# Patient Record
Sex: Female | Born: 1950
Health system: Southern US, Community
[De-identification: ages and names within clinical notes are randomized; demographics above are authoritative.]

## PROBLEM LIST (undated history)

## (undated) ENCOUNTER — Emergency Department (HOSPITAL_BASED_OUTPATIENT_CLINIC_OR_DEPARTMENT_OTHER): Admission: EM | Payer: Medicare Other

## (undated) DIAGNOSIS — J45909 Unspecified asthma, uncomplicated: Secondary | ICD-10-CM

## (undated) DIAGNOSIS — I2510 Atherosclerotic heart disease of native coronary artery without angina pectoris: Secondary | ICD-10-CM

## (undated) DIAGNOSIS — M5430 Sciatica, unspecified side: Secondary | ICD-10-CM

## (undated) DIAGNOSIS — Z8489 Family history of other specified conditions: Secondary | ICD-10-CM

## (undated) DIAGNOSIS — J449 Chronic obstructive pulmonary disease, unspecified: Secondary | ICD-10-CM

## (undated) DIAGNOSIS — M1990 Unspecified osteoarthritis, unspecified site: Secondary | ICD-10-CM

## (undated) DIAGNOSIS — G4730 Sleep apnea, unspecified: Secondary | ICD-10-CM

## (undated) DIAGNOSIS — J189 Pneumonia, unspecified organism: Secondary | ICD-10-CM

## (undated) DIAGNOSIS — J40 Bronchitis, not specified as acute or chronic: Secondary | ICD-10-CM

## (undated) DIAGNOSIS — C7910 Secondary malignant neoplasm of unspecified urinary organs: Secondary | ICD-10-CM

## (undated) DIAGNOSIS — M797 Fibromyalgia: Secondary | ICD-10-CM

## (undated) DIAGNOSIS — I219 Acute myocardial infarction, unspecified: Secondary | ICD-10-CM

## (undated) DIAGNOSIS — N189 Chronic kidney disease, unspecified: Secondary | ICD-10-CM

## (undated) DIAGNOSIS — I739 Peripheral vascular disease, unspecified: Secondary | ICD-10-CM

## (undated) DIAGNOSIS — E039 Hypothyroidism, unspecified: Secondary | ICD-10-CM

## (undated) DIAGNOSIS — F32A Depression, unspecified: Secondary | ICD-10-CM

## (undated) DIAGNOSIS — M5126 Other intervertebral disc displacement, lumbar region: Secondary | ICD-10-CM

## (undated) DIAGNOSIS — D649 Anemia, unspecified: Secondary | ICD-10-CM

## (undated) DIAGNOSIS — F419 Anxiety disorder, unspecified: Secondary | ICD-10-CM

## (undated) DIAGNOSIS — I10 Essential (primary) hypertension: Secondary | ICD-10-CM

## (undated) DIAGNOSIS — F329 Major depressive disorder, single episode, unspecified: Secondary | ICD-10-CM

## (undated) DIAGNOSIS — K56609 Unspecified intestinal obstruction, unspecified as to partial versus complete obstruction: Secondary | ICD-10-CM

## (undated) HISTORY — PX: ABDOMINAL SURGERY: SHX537

## (undated) HISTORY — PX: ABDOMINAL HYSTERECTOMY: SHX81

## (undated) HISTORY — PX: OTHER SURGICAL HISTORY: SHX169

## (undated) HISTORY — PX: TONSILLECTOMY: SUR1361

## (undated) HISTORY — PX: BACK SURGERY: SHX140

## (undated) HISTORY — PX: EYE SURGERY: SHX253

## (undated) HISTORY — PX: DILATION AND CURETTAGE OF UTERUS: SHX78

## (undated) HISTORY — PX: COLONOSCOPY: SHX174

## (undated) HISTORY — PX: SALIVARY GLAND SURGERY: SHX768

## (undated) HISTORY — PX: COLON SURGERY: SHX602

## (undated) HISTORY — PX: ANTERIOR CRUCIATE LIGAMENT REPAIR: SHX115

## (undated) MED FILL — Fosaprepitant Dimeglumine For IV Infusion 150 MG (Base Eq): INTRAVENOUS | Qty: 5 | Status: AC

---

## 1969-02-23 HISTORY — PX: HERNIA REPAIR: SHX51

## 2000-12-28 ENCOUNTER — Emergency Department (HOSPITAL_COMMUNITY): Admission: EM | Admit: 2000-12-28 | Discharge: 2000-12-28 | Payer: Self-pay | Admitting: Emergency Medicine

## 2001-02-25 ENCOUNTER — Encounter: Admission: RE | Admit: 2001-02-25 | Discharge: 2001-04-24 | Payer: Self-pay | Admitting: Family Medicine

## 2001-04-28 ENCOUNTER — Encounter: Admission: RE | Admit: 2001-04-28 | Discharge: 2001-04-28 | Payer: Self-pay | Admitting: Neurosurgery

## 2001-04-28 ENCOUNTER — Encounter: Payer: Self-pay | Admitting: Neurosurgery

## 2001-05-12 ENCOUNTER — Encounter: Admission: RE | Admit: 2001-05-12 | Discharge: 2001-05-12 | Payer: Self-pay | Admitting: Neurosurgery

## 2001-05-12 ENCOUNTER — Encounter: Payer: Self-pay | Admitting: Neurosurgery

## 2001-05-25 ENCOUNTER — Inpatient Hospital Stay (HOSPITAL_COMMUNITY): Admission: EM | Admit: 2001-05-25 | Discharge: 2001-05-27 | Payer: Self-pay | Admitting: Emergency Medicine

## 2001-05-25 ENCOUNTER — Encounter: Payer: Self-pay | Admitting: Emergency Medicine

## 2001-05-26 ENCOUNTER — Encounter: Payer: Self-pay | Admitting: Cardiology

## 2001-06-04 ENCOUNTER — Emergency Department (HOSPITAL_COMMUNITY): Admission: EM | Admit: 2001-06-04 | Discharge: 2001-06-04 | Payer: Self-pay | Admitting: Emergency Medicine

## 2001-06-04 ENCOUNTER — Encounter: Payer: Self-pay | Admitting: Emergency Medicine

## 2001-06-04 ENCOUNTER — Encounter: Admission: RE | Admit: 2001-06-04 | Discharge: 2001-06-04 | Payer: Self-pay | Admitting: Internal Medicine

## 2001-06-06 ENCOUNTER — Encounter: Admission: RE | Admit: 2001-06-06 | Discharge: 2001-06-06 | Payer: Self-pay | Admitting: Neurosurgery

## 2001-06-06 ENCOUNTER — Encounter: Payer: Self-pay | Admitting: Neurosurgery

## 2001-07-04 ENCOUNTER — Encounter: Admission: RE | Admit: 2001-07-04 | Discharge: 2001-07-04 | Payer: Self-pay | Admitting: *Deleted

## 2001-07-04 ENCOUNTER — Encounter: Payer: Self-pay | Admitting: Family Medicine

## 2001-07-04 ENCOUNTER — Ambulatory Visit (HOSPITAL_COMMUNITY): Admission: RE | Admit: 2001-07-04 | Discharge: 2001-07-04 | Payer: Self-pay | Admitting: Family Medicine

## 2001-07-30 ENCOUNTER — Ambulatory Visit (HOSPITAL_COMMUNITY): Admission: RE | Admit: 2001-07-30 | Discharge: 2001-07-31 | Payer: Self-pay | Admitting: Neurosurgery

## 2001-07-30 ENCOUNTER — Encounter: Payer: Self-pay | Admitting: Neurosurgery

## 2001-10-24 ENCOUNTER — Emergency Department (HOSPITAL_COMMUNITY): Admission: EM | Admit: 2001-10-24 | Discharge: 2001-10-24 | Payer: Self-pay | Admitting: Emergency Medicine

## 2001-12-07 ENCOUNTER — Inpatient Hospital Stay (HOSPITAL_COMMUNITY): Admission: EM | Admit: 2001-12-07 | Discharge: 2001-12-11 | Payer: Self-pay | Admitting: Emergency Medicine

## 2001-12-07 ENCOUNTER — Encounter: Payer: Self-pay | Admitting: Emergency Medicine

## 2002-01-03 ENCOUNTER — Emergency Department (HOSPITAL_COMMUNITY): Admission: EM | Admit: 2002-01-03 | Discharge: 2002-01-03 | Payer: Self-pay | Admitting: Emergency Medicine

## 2002-02-26 ENCOUNTER — Encounter: Admission: RE | Admit: 2002-02-26 | Discharge: 2002-02-26 | Payer: Self-pay | Admitting: Neurosurgery

## 2002-02-26 ENCOUNTER — Encounter: Payer: Self-pay | Admitting: Neurosurgery

## 2002-03-12 ENCOUNTER — Encounter: Admission: RE | Admit: 2002-03-12 | Discharge: 2002-03-12 | Payer: Self-pay | Admitting: Neurosurgery

## 2002-03-12 ENCOUNTER — Encounter: Payer: Self-pay | Admitting: Neurosurgery

## 2002-03-26 ENCOUNTER — Encounter: Payer: Self-pay | Admitting: Neurosurgery

## 2002-03-26 ENCOUNTER — Encounter: Admission: RE | Admit: 2002-03-26 | Discharge: 2002-03-26 | Payer: Self-pay | Admitting: Neurosurgery

## 2002-05-12 ENCOUNTER — Emergency Department (HOSPITAL_COMMUNITY): Admission: EM | Admit: 2002-05-12 | Discharge: 2002-05-12 | Payer: Self-pay | Admitting: Emergency Medicine

## 2002-06-16 ENCOUNTER — Encounter: Payer: Self-pay | Admitting: Neurosurgery

## 2002-06-16 ENCOUNTER — Encounter: Admission: RE | Admit: 2002-06-16 | Discharge: 2002-06-16 | Payer: Self-pay | Admitting: Neurosurgery

## 2002-06-17 ENCOUNTER — Emergency Department (HOSPITAL_COMMUNITY): Admission: EM | Admit: 2002-06-17 | Discharge: 2002-06-17 | Payer: Self-pay | Admitting: Emergency Medicine

## 2002-07-23 ENCOUNTER — Emergency Department (HOSPITAL_COMMUNITY): Admission: EM | Admit: 2002-07-23 | Discharge: 2002-07-23 | Payer: Self-pay | Admitting: Emergency Medicine

## 2002-07-23 ENCOUNTER — Encounter: Payer: Self-pay | Admitting: Neurosurgery

## 2002-08-04 ENCOUNTER — Encounter
Admission: RE | Admit: 2002-08-04 | Discharge: 2002-08-04 | Payer: Self-pay | Admitting: Physical Medicine & Rehabilitation

## 2002-08-24 ENCOUNTER — Ambulatory Visit (HOSPITAL_COMMUNITY): Admission: RE | Admit: 2002-08-24 | Discharge: 2002-08-24 | Payer: Self-pay | Admitting: Family Medicine

## 2002-08-24 ENCOUNTER — Encounter: Payer: Self-pay | Admitting: Family Medicine

## 2002-10-10 ENCOUNTER — Emergency Department (HOSPITAL_COMMUNITY): Admission: EM | Admit: 2002-10-10 | Discharge: 2002-10-10 | Payer: Self-pay | Admitting: Emergency Medicine

## 2002-10-10 ENCOUNTER — Encounter: Payer: Self-pay | Admitting: Emergency Medicine

## 2002-10-11 ENCOUNTER — Emergency Department (HOSPITAL_COMMUNITY): Admission: EM | Admit: 2002-10-11 | Discharge: 2002-10-11 | Payer: Self-pay | Admitting: Emergency Medicine

## 2002-10-12 ENCOUNTER — Emergency Department (HOSPITAL_COMMUNITY): Admission: EM | Admit: 2002-10-12 | Discharge: 2002-10-12 | Payer: Self-pay | Admitting: Emergency Medicine

## 2002-10-12 ENCOUNTER — Encounter: Payer: Self-pay | Admitting: Emergency Medicine

## 2002-11-10 ENCOUNTER — Emergency Department (HOSPITAL_COMMUNITY): Admission: EM | Admit: 2002-11-10 | Discharge: 2002-11-10 | Payer: Self-pay | Admitting: Emergency Medicine

## 2002-11-26 ENCOUNTER — Encounter
Admission: RE | Admit: 2002-11-26 | Discharge: 2003-02-24 | Payer: Self-pay | Admitting: Physical Medicine & Rehabilitation

## 2003-02-23 ENCOUNTER — Encounter
Admission: RE | Admit: 2003-02-23 | Discharge: 2003-05-24 | Payer: Self-pay | Admitting: Physical Medicine & Rehabilitation

## 2003-05-15 ENCOUNTER — Emergency Department (HOSPITAL_COMMUNITY): Admission: EM | Admit: 2003-05-15 | Discharge: 2003-05-15 | Payer: Self-pay | Admitting: Emergency Medicine

## 2003-05-24 ENCOUNTER — Encounter
Admission: RE | Admit: 2003-05-24 | Discharge: 2003-08-22 | Payer: Self-pay | Admitting: Physical Medicine & Rehabilitation

## 2003-08-30 ENCOUNTER — Encounter: Admission: RE | Admit: 2003-08-30 | Discharge: 2003-08-30 | Payer: Self-pay | Admitting: Internal Medicine

## 2003-08-30 ENCOUNTER — Emergency Department (HOSPITAL_COMMUNITY): Admission: EM | Admit: 2003-08-30 | Discharge: 2003-08-30 | Payer: Self-pay | Admitting: Family Medicine

## 2004-02-12 ENCOUNTER — Emergency Department (HOSPITAL_COMMUNITY): Admission: EM | Admit: 2004-02-12 | Discharge: 2004-02-12 | Payer: Self-pay

## 2004-05-19 ENCOUNTER — Emergency Department (HOSPITAL_COMMUNITY): Admission: EM | Admit: 2004-05-19 | Discharge: 2004-05-19 | Payer: Self-pay | Admitting: Emergency Medicine

## 2004-06-02 ENCOUNTER — Ambulatory Visit: Payer: Self-pay | Admitting: Unknown Physician Specialty

## 2004-06-13 ENCOUNTER — Emergency Department (HOSPITAL_COMMUNITY): Admission: EM | Admit: 2004-06-13 | Discharge: 2004-06-13 | Payer: Self-pay | Admitting: Emergency Medicine

## 2004-09-05 ENCOUNTER — Ambulatory Visit: Payer: Self-pay | Admitting: Family Medicine

## 2004-09-08 ENCOUNTER — Ambulatory Visit (HOSPITAL_COMMUNITY): Admission: RE | Admit: 2004-09-08 | Discharge: 2004-09-08 | Payer: Self-pay | Admitting: Family Medicine

## 2004-09-08 ENCOUNTER — Ambulatory Visit: Payer: Self-pay | Admitting: Anesthesiology

## 2004-09-11 ENCOUNTER — Ambulatory Visit: Payer: Self-pay | Admitting: Anesthesiology

## 2004-09-14 ENCOUNTER — Ambulatory Visit: Payer: Self-pay | Admitting: Orthopedic Surgery

## 2004-09-21 ENCOUNTER — Ambulatory Visit: Payer: Self-pay | Admitting: Anesthesiology

## 2004-10-10 ENCOUNTER — Ambulatory Visit: Payer: Self-pay | Admitting: Orthopedic Surgery

## 2004-10-10 ENCOUNTER — Ambulatory Visit (HOSPITAL_COMMUNITY): Admission: RE | Admit: 2004-10-10 | Discharge: 2004-10-10 | Payer: Self-pay | Admitting: General Surgery

## 2004-10-12 ENCOUNTER — Ambulatory Visit: Payer: Self-pay | Admitting: Orthopedic Surgery

## 2004-10-17 ENCOUNTER — Encounter: Admission: RE | Admit: 2004-10-17 | Discharge: 2004-11-08 | Payer: Self-pay | Admitting: Orthopedic Surgery

## 2004-10-23 ENCOUNTER — Ambulatory Visit: Payer: Self-pay | Admitting: Anesthesiology

## 2004-10-26 ENCOUNTER — Ambulatory Visit: Payer: Self-pay | Admitting: Orthopedic Surgery

## 2004-10-30 ENCOUNTER — Ambulatory Visit: Payer: Self-pay | Admitting: Family Medicine

## 2004-11-23 ENCOUNTER — Ambulatory Visit: Payer: Self-pay | Admitting: Anesthesiology

## 2005-01-04 ENCOUNTER — Ambulatory Visit: Payer: Self-pay | Admitting: Anesthesiology

## 2005-01-12 ENCOUNTER — Emergency Department (HOSPITAL_COMMUNITY): Admission: EM | Admit: 2005-01-12 | Discharge: 2005-01-12 | Payer: Self-pay | Admitting: Emergency Medicine

## 2005-01-24 ENCOUNTER — Ambulatory Visit: Payer: Self-pay | Admitting: Anesthesiology

## 2005-01-25 ENCOUNTER — Ambulatory Visit: Payer: Self-pay | Admitting: Anesthesiology

## 2005-01-31 ENCOUNTER — Encounter (INDEPENDENT_AMBULATORY_CARE_PROVIDER_SITE_OTHER): Payer: Self-pay | Admitting: Internal Medicine

## 2005-01-31 ENCOUNTER — Ambulatory Visit: Payer: Self-pay | Admitting: Family Medicine

## 2005-01-31 ENCOUNTER — Ambulatory Visit (HOSPITAL_COMMUNITY): Admission: RE | Admit: 2005-01-31 | Discharge: 2005-01-31 | Payer: Self-pay | Admitting: Family Medicine

## 2005-02-01 ENCOUNTER — Encounter (INDEPENDENT_AMBULATORY_CARE_PROVIDER_SITE_OTHER): Payer: Self-pay | Admitting: Internal Medicine

## 2005-02-05 ENCOUNTER — Encounter (INDEPENDENT_AMBULATORY_CARE_PROVIDER_SITE_OTHER): Payer: Self-pay | Admitting: Internal Medicine

## 2005-02-05 ENCOUNTER — Encounter (HOSPITAL_COMMUNITY): Admission: RE | Admit: 2005-02-05 | Discharge: 2005-03-07 | Payer: Self-pay | Admitting: Family Medicine

## 2005-03-14 ENCOUNTER — Ambulatory Visit: Payer: Self-pay | Admitting: Family Medicine

## 2005-03-15 ENCOUNTER — Ambulatory Visit (HOSPITAL_COMMUNITY): Admission: RE | Admit: 2005-03-15 | Discharge: 2005-03-15 | Payer: Self-pay | Admitting: General Surgery

## 2005-03-22 ENCOUNTER — Ambulatory Visit: Payer: Self-pay | Admitting: Anesthesiology

## 2005-03-23 ENCOUNTER — Encounter: Admission: RE | Admit: 2005-03-23 | Discharge: 2005-05-11 | Payer: Self-pay | Admitting: Family Medicine

## 2005-04-01 ENCOUNTER — Encounter (INDEPENDENT_AMBULATORY_CARE_PROVIDER_SITE_OTHER): Payer: Self-pay | Admitting: Internal Medicine

## 2005-04-16 ENCOUNTER — Ambulatory Visit: Payer: Self-pay | Admitting: Family Medicine

## 2005-04-23 ENCOUNTER — Encounter (INDEPENDENT_AMBULATORY_CARE_PROVIDER_SITE_OTHER): Payer: Self-pay | Admitting: Internal Medicine

## 2005-05-24 ENCOUNTER — Ambulatory Visit: Payer: Self-pay | Admitting: Family Medicine

## 2005-05-30 ENCOUNTER — Encounter (INDEPENDENT_AMBULATORY_CARE_PROVIDER_SITE_OTHER): Payer: Self-pay | Admitting: Internal Medicine

## 2005-07-10 ENCOUNTER — Ambulatory Visit: Payer: Self-pay | Admitting: Family Medicine

## 2005-11-08 ENCOUNTER — Ambulatory Visit: Payer: Self-pay | Admitting: Internal Medicine

## 2005-11-23 ENCOUNTER — Telehealth (INDEPENDENT_AMBULATORY_CARE_PROVIDER_SITE_OTHER): Payer: Self-pay | Admitting: Internal Medicine

## 2005-12-04 ENCOUNTER — Telehealth (INDEPENDENT_AMBULATORY_CARE_PROVIDER_SITE_OTHER): Payer: Self-pay | Admitting: Internal Medicine

## 2005-12-20 ENCOUNTER — Ambulatory Visit: Payer: Self-pay | Admitting: Anesthesiology

## 2006-01-01 ENCOUNTER — Ambulatory Visit: Payer: Self-pay | Admitting: Anesthesiology

## 2006-01-30 ENCOUNTER — Other Ambulatory Visit: Admission: RE | Admit: 2006-01-30 | Discharge: 2006-01-30 | Payer: Self-pay | Admitting: Family Medicine

## 2006-01-30 ENCOUNTER — Ambulatory Visit: Payer: Self-pay | Admitting: Internal Medicine

## 2006-01-31 ENCOUNTER — Encounter (INDEPENDENT_AMBULATORY_CARE_PROVIDER_SITE_OTHER): Payer: Self-pay | Admitting: Internal Medicine

## 2006-02-01 ENCOUNTER — Emergency Department (HOSPITAL_COMMUNITY): Admission: EM | Admit: 2006-02-01 | Discharge: 2006-02-01 | Payer: Self-pay | Admitting: Emergency Medicine

## 2006-02-04 ENCOUNTER — Ambulatory Visit (HOSPITAL_COMMUNITY): Admission: RE | Admit: 2006-02-04 | Discharge: 2006-02-04 | Payer: Self-pay | Admitting: Internal Medicine

## 2006-02-04 ENCOUNTER — Ambulatory Visit: Payer: Self-pay | Admitting: Internal Medicine

## 2006-02-05 ENCOUNTER — Encounter (INDEPENDENT_AMBULATORY_CARE_PROVIDER_SITE_OTHER): Payer: Self-pay | Admitting: Internal Medicine

## 2006-02-05 ENCOUNTER — Ambulatory Visit: Payer: Self-pay | Admitting: Anesthesiology

## 2006-03-04 ENCOUNTER — Ambulatory Visit: Payer: Self-pay | Admitting: Internal Medicine

## 2006-03-05 ENCOUNTER — Ambulatory Visit: Payer: Self-pay | Admitting: Anesthesiology

## 2006-06-13 ENCOUNTER — Ambulatory Visit: Payer: Self-pay | Admitting: Anesthesiology

## 2006-06-15 ENCOUNTER — Emergency Department (HOSPITAL_COMMUNITY): Admission: EM | Admit: 2006-06-15 | Discharge: 2006-06-15 | Payer: Self-pay | Admitting: Emergency Medicine

## 2006-06-20 ENCOUNTER — Telehealth (INDEPENDENT_AMBULATORY_CARE_PROVIDER_SITE_OTHER): Payer: Self-pay | Admitting: Internal Medicine

## 2006-06-28 DIAGNOSIS — J309 Allergic rhinitis, unspecified: Secondary | ICD-10-CM | POA: Insufficient documentation

## 2006-06-28 DIAGNOSIS — K1120 Sialoadenitis, unspecified: Secondary | ICD-10-CM | POA: Insufficient documentation

## 2006-06-28 DIAGNOSIS — F411 Generalized anxiety disorder: Secondary | ICD-10-CM | POA: Insufficient documentation

## 2006-06-28 DIAGNOSIS — A5901 Trichomonal vulvovaginitis: Secondary | ICD-10-CM | POA: Insufficient documentation

## 2006-06-28 DIAGNOSIS — I10 Essential (primary) hypertension: Secondary | ICD-10-CM | POA: Insufficient documentation

## 2006-06-28 DIAGNOSIS — G5600 Carpal tunnel syndrome, unspecified upper limb: Secondary | ICD-10-CM | POA: Insufficient documentation

## 2006-06-28 DIAGNOSIS — M545 Low back pain, unspecified: Secondary | ICD-10-CM | POA: Insufficient documentation

## 2006-06-28 DIAGNOSIS — J45909 Unspecified asthma, uncomplicated: Secondary | ICD-10-CM | POA: Insufficient documentation

## 2006-06-28 DIAGNOSIS — D649 Anemia, unspecified: Secondary | ICD-10-CM | POA: Insufficient documentation

## 2006-06-28 DIAGNOSIS — D638 Anemia in other chronic diseases classified elsewhere: Secondary | ICD-10-CM | POA: Insufficient documentation

## 2006-06-28 DIAGNOSIS — F329 Major depressive disorder, single episode, unspecified: Secondary | ICD-10-CM | POA: Insufficient documentation

## 2006-06-28 DIAGNOSIS — E785 Hyperlipidemia, unspecified: Secondary | ICD-10-CM | POA: Insufficient documentation

## 2006-07-01 ENCOUNTER — Ambulatory Visit: Payer: Self-pay | Admitting: Internal Medicine

## 2006-07-23 ENCOUNTER — Ambulatory Visit: Payer: Self-pay | Admitting: Anesthesiology

## 2006-07-26 ENCOUNTER — Ambulatory Visit (HOSPITAL_COMMUNITY): Admission: RE | Admit: 2006-07-26 | Discharge: 2006-07-26 | Payer: Self-pay | Admitting: Internal Medicine

## 2006-07-26 ENCOUNTER — Encounter (INDEPENDENT_AMBULATORY_CARE_PROVIDER_SITE_OTHER): Payer: Self-pay | Admitting: Internal Medicine

## 2006-07-30 ENCOUNTER — Encounter (INDEPENDENT_AMBULATORY_CARE_PROVIDER_SITE_OTHER): Payer: Self-pay | Admitting: Internal Medicine

## 2006-10-14 ENCOUNTER — Emergency Department (HOSPITAL_COMMUNITY): Admission: EM | Admit: 2006-10-14 | Discharge: 2006-10-14 | Payer: Self-pay | Admitting: Emergency Medicine

## 2006-10-29 ENCOUNTER — Ambulatory Visit: Payer: Self-pay | Admitting: Anesthesiology

## 2007-01-14 ENCOUNTER — Telehealth (INDEPENDENT_AMBULATORY_CARE_PROVIDER_SITE_OTHER): Payer: Self-pay | Admitting: *Deleted

## 2007-01-17 ENCOUNTER — Telehealth (INDEPENDENT_AMBULATORY_CARE_PROVIDER_SITE_OTHER): Payer: Self-pay | Admitting: *Deleted

## 2007-01-20 ENCOUNTER — Telehealth (INDEPENDENT_AMBULATORY_CARE_PROVIDER_SITE_OTHER): Payer: Self-pay | Admitting: *Deleted

## 2007-03-13 ENCOUNTER — Emergency Department (HOSPITAL_COMMUNITY): Admission: EM | Admit: 2007-03-13 | Discharge: 2007-03-13 | Payer: Self-pay | Admitting: Emergency Medicine

## 2007-04-13 ENCOUNTER — Emergency Department (HOSPITAL_COMMUNITY): Admission: EM | Admit: 2007-04-13 | Discharge: 2007-04-13 | Payer: Self-pay | Admitting: Emergency Medicine

## 2007-04-14 ENCOUNTER — Encounter (INDEPENDENT_AMBULATORY_CARE_PROVIDER_SITE_OTHER): Payer: Self-pay | Admitting: Internal Medicine

## 2007-04-16 ENCOUNTER — Encounter (INDEPENDENT_AMBULATORY_CARE_PROVIDER_SITE_OTHER): Payer: Self-pay | Admitting: Internal Medicine

## 2007-05-05 ENCOUNTER — Ambulatory Visit: Payer: Self-pay | Admitting: Anesthesiology

## 2007-06-26 ENCOUNTER — Encounter: Payer: Self-pay | Admitting: Family Medicine

## 2007-09-15 ENCOUNTER — Ambulatory Visit (HOSPITAL_COMMUNITY): Admission: RE | Admit: 2007-09-15 | Discharge: 2007-09-15 | Payer: Self-pay | Admitting: Internal Medicine

## 2007-10-04 ENCOUNTER — Emergency Department (HOSPITAL_COMMUNITY): Admission: EM | Admit: 2007-10-04 | Discharge: 2007-10-04 | Payer: Self-pay | Admitting: Emergency Medicine

## 2008-01-23 ENCOUNTER — Emergency Department (HOSPITAL_COMMUNITY): Admission: EM | Admit: 2008-01-23 | Discharge: 2008-01-23 | Payer: Self-pay | Admitting: Emergency Medicine

## 2008-01-27 ENCOUNTER — Encounter: Payer: Self-pay | Admitting: Orthopedic Surgery

## 2008-01-29 ENCOUNTER — Ambulatory Visit (HOSPITAL_COMMUNITY): Admission: RE | Admit: 2008-01-29 | Discharge: 2008-01-29 | Payer: Self-pay | Admitting: Orthopaedic Surgery

## 2008-02-17 ENCOUNTER — Ambulatory Visit (HOSPITAL_COMMUNITY): Admission: RE | Admit: 2008-02-17 | Discharge: 2008-02-17 | Payer: Self-pay | Admitting: Orthopaedic Surgery

## 2008-10-01 ENCOUNTER — Emergency Department (HOSPITAL_COMMUNITY): Admission: EM | Admit: 2008-10-01 | Discharge: 2008-10-01 | Payer: Self-pay | Admitting: Family Medicine

## 2009-01-30 ENCOUNTER — Emergency Department (HOSPITAL_COMMUNITY): Admission: EM | Admit: 2009-01-30 | Discharge: 2009-01-30 | Payer: Self-pay | Admitting: Emergency Medicine

## 2009-01-31 ENCOUNTER — Emergency Department (HOSPITAL_COMMUNITY): Admission: EM | Admit: 2009-01-31 | Discharge: 2009-01-31 | Payer: Self-pay | Admitting: Emergency Medicine

## 2009-05-18 ENCOUNTER — Ambulatory Visit (HOSPITAL_COMMUNITY): Admission: RE | Admit: 2009-05-18 | Discharge: 2009-05-18 | Payer: Self-pay | Admitting: Internal Medicine

## 2009-05-23 ENCOUNTER — Emergency Department (HOSPITAL_COMMUNITY): Admission: EM | Admit: 2009-05-23 | Discharge: 2009-05-23 | Payer: Self-pay | Admitting: Emergency Medicine

## 2009-05-27 ENCOUNTER — Inpatient Hospital Stay (HOSPITAL_COMMUNITY): Admission: EM | Admit: 2009-05-27 | Discharge: 2009-05-31 | Payer: Self-pay | Admitting: Emergency Medicine

## 2009-06-01 ENCOUNTER — Inpatient Hospital Stay (HOSPITAL_COMMUNITY): Admission: EM | Admit: 2009-06-01 | Discharge: 2009-06-14 | Payer: Self-pay | Admitting: Emergency Medicine

## 2009-07-04 ENCOUNTER — Emergency Department (HOSPITAL_COMMUNITY): Admission: EM | Admit: 2009-07-04 | Discharge: 2009-07-04 | Payer: Self-pay | Admitting: Emergency Medicine

## 2009-07-17 ENCOUNTER — Observation Stay (HOSPITAL_COMMUNITY): Admission: EM | Admit: 2009-07-17 | Discharge: 2009-07-17 | Payer: Self-pay | Admitting: Emergency Medicine

## 2009-08-04 ENCOUNTER — Emergency Department (HOSPITAL_COMMUNITY): Admission: EM | Admit: 2009-08-04 | Discharge: 2009-08-04 | Payer: Self-pay | Admitting: Emergency Medicine

## 2009-08-06 ENCOUNTER — Emergency Department (HOSPITAL_COMMUNITY): Admission: EM | Admit: 2009-08-06 | Discharge: 2009-08-06 | Payer: Self-pay | Admitting: Family Medicine

## 2010-05-02 ENCOUNTER — Inpatient Hospital Stay (HOSPITAL_COMMUNITY): Admission: EM | Admit: 2010-05-02 | Discharge: 2010-05-17 | Payer: Self-pay | Admitting: Emergency Medicine

## 2010-07-13 ENCOUNTER — Encounter
Admission: RE | Admit: 2010-07-13 | Discharge: 2010-07-13 | Payer: Self-pay | Source: Home / Self Care | Attending: General Surgery | Admitting: General Surgery

## 2010-07-16 ENCOUNTER — Encounter: Payer: Self-pay | Admitting: Internal Medicine

## 2010-07-25 NOTE — Letter (Signed)
Summary: rpc chart  rpc chart   Imported By: Curtis Sites 04/10/2010 10:21:02  _____________________________________________________________________  External Attachment:    Type:   Image     Comment:   External Document

## 2010-08-30 ENCOUNTER — Other Ambulatory Visit (HOSPITAL_COMMUNITY): Payer: Self-pay

## 2010-09-05 LAB — BASIC METABOLIC PANEL WITH GFR
BUN: 7 mg/dL (ref 6–23)
BUN: 8 mg/dL (ref 6–23)
BUN: 8 mg/dL (ref 6–23)
CO2: 24 meq/L (ref 19–32)
CO2: 25 meq/L (ref 19–32)
CO2: 30 meq/L (ref 19–32)
Calcium: 8.7 mg/dL (ref 8.4–10.5)
Calcium: 9.1 mg/dL (ref 8.4–10.5)
Calcium: 9.3 mg/dL (ref 8.4–10.5)
Chloride: 102 meq/L (ref 96–112)
Chloride: 97 meq/L (ref 96–112)
Chloride: 98 meq/L (ref 96–112)
Creatinine, Ser: 0.8 mg/dL (ref 0.4–1.2)
Creatinine, Ser: 0.82 mg/dL (ref 0.4–1.2)
Creatinine, Ser: 0.83 mg/dL (ref 0.4–1.2)
GFR calc non Af Amer: >60 mL/min
GFR calc non Af Amer: >60 mL/min
GFR calc non Af Amer: >60 mL/min
Glucose, Bld: 125 mg/dL — ABNORMAL HIGH (ref 70–99)
Glucose, Bld: 138 mg/dL — ABNORMAL HIGH (ref 70–99)
Glucose, Bld: 65 mg/dL — ABNORMAL LOW (ref 70–99)
Potassium: 3.7 meq/L (ref 3.5–5.1)
Potassium: 4.7 meq/L (ref 3.5–5.1)
Potassium: 5 meq/L (ref 3.5–5.1)
Sodium: 134 meq/L — ABNORMAL LOW (ref 135–145)
Sodium: 136 meq/L (ref 135–145)
Sodium: 139 meq/L (ref 135–145)

## 2010-09-05 LAB — CBC
HCT: 32.2 % — ABNORMAL LOW (ref 36.0–46.0)
HCT: 35.2 % — ABNORMAL LOW (ref 36.0–46.0)
HCT: 42 % (ref 36.0–46.0)
HCT: 43 % (ref 36.0–46.0)
HCT: 45.6 % (ref 36.0–46.0)
Hemoglobin: 10.8 g/dL — ABNORMAL LOW (ref 12.0–15.0)
Hemoglobin: 10.9 g/dL — ABNORMAL LOW (ref 12.0–15.0)
Hemoglobin: 11.7 g/dL — ABNORMAL LOW (ref 12.0–15.0)
Hemoglobin: 13.7 g/dL (ref 12.0–15.0)
Hemoglobin: 14.6 g/dL (ref 12.0–15.0)
Hemoglobin: 14.7 g/dL (ref 12.0–15.0)
Hemoglobin: 15.9 g/dL — ABNORMAL HIGH (ref 12.0–15.0)
MCH: 31.9 pg (ref 26.0–34.0)
MCH: 32 pg (ref 26.0–34.0)
MCH: 32.5 pg (ref 26.0–34.0)
MCH: 32.9 pg (ref 26.0–34.0)
MCHC: 32.6 g/dL (ref 30.0–36.0)
MCHC: 33.2 g/dL (ref 30.0–36.0)
MCHC: 33.2 g/dL (ref 30.0–36.0)
MCHC: 34 g/dL (ref 30.0–36.0)
MCV: 94.8 fL (ref 78.0–100.0)
MCV: 95.8 fL (ref 78.0–100.0)
Platelets: 251 10*3/uL (ref 150–400)
RBC: 4.49 MIL/uL (ref 3.87–5.11)
RBC: 4.52 MIL/uL (ref 3.87–5.11)
RBC: 4.6 MIL/uL (ref 3.87–5.11)
RBC: 4.84 MIL/uL (ref 3.87–5.11)
RDW: 12.6 % (ref 11.5–15.5)
RDW: 13.4 % (ref 11.5–15.5)
WBC: 6 10*3/uL (ref 4.0–10.5)
WBC: 7.1 10*3/uL (ref 4.0–10.5)
WBC: 7.2 10*3/uL (ref 4.0–10.5)
WBC: 8 10*3/uL (ref 4.0–10.5)

## 2010-09-05 LAB — PHOSPHORUS
Phosphorus: 2.9 mg/dL (ref 2.3–4.6)
Phosphorus: 3.8 mg/dL (ref 2.3–4.6)

## 2010-09-05 LAB — DIFFERENTIAL
Basophils Absolute: 0 10*3/uL (ref 0.0–0.1)
Basophils Relative: 0 % (ref 0–1)
Eosinophils Absolute: 0 10*3/uL (ref 0.0–0.7)
Eosinophils Relative: 1 % (ref 0–5)
Lymphocytes Relative: 34 % (ref 12–46)
Lymphs Abs: 0.9 10*3/uL (ref 0.7–4.0)
Monocytes Absolute: 0.7 10*3/uL (ref 0.1–1.0)
Monocytes Absolute: 1.6 10*3/uL — ABNORMAL HIGH (ref 0.1–1.0)
Monocytes Relative: 15 % — ABNORMAL HIGH (ref 3–12)
Neutro Abs: 4.5 10*3/uL (ref 1.7–7.7)
Neutro Abs: 7.9 10*3/uL — ABNORMAL HIGH (ref 1.7–7.7)
Neutrophils Relative %: 76 % (ref 43–77)

## 2010-09-05 LAB — COMPREHENSIVE METABOLIC PANEL WITH GFR
ALT: 13 U/L (ref 0–35)
AST: 16 U/L (ref 0–37)
Albumin: 3.5 g/dL (ref 3.5–5.2)
Alkaline Phosphatase: 91 U/L (ref 39–117)
BUN: 7 mg/dL (ref 6–23)
CO2: 29 meq/L (ref 19–32)
Calcium: 9.4 mg/dL (ref 8.4–10.5)
Chloride: 103 meq/L (ref 96–112)
Creatinine, Ser: 0.74 mg/dL (ref 0.4–1.2)
GFR calc non Af Amer: >60 mL/min
Glucose, Bld: 125 mg/dL — ABNORMAL HIGH (ref 70–99)
Potassium: 3.5 meq/L (ref 3.5–5.1)
Sodium: 140 meq/L (ref 135–145)
Total Bilirubin: 0.7 mg/dL (ref 0.3–1.2)
Total Protein: 6.7 g/dL (ref 6.0–8.3)

## 2010-09-05 LAB — BASIC METABOLIC PANEL
BUN: 2 mg/dL — ABNORMAL LOW (ref 6–23)
BUN: 3 mg/dL — ABNORMAL LOW (ref 6–23)
CO2: 30 mEq/L (ref 19–32)
Calcium: 8.6 mg/dL (ref 8.4–10.5)
Calcium: 8.6 mg/dL (ref 8.4–10.5)
GFR calc Af Amer: >60 mL/min (ref >60)
GFR calc Af Amer: >60 mL/min (ref >60)
GFR calc non Af Amer: >60 mL/min (ref >60)
GFR calc non Af Amer: >60 mL/min (ref >60)
GFR calc non Af Amer: >60 mL/min (ref >60)
GFR calc non Af Amer: >60 mL/min (ref >60)
Glucose, Bld: 106 mg/dL — ABNORMAL HIGH (ref 70–99)
Glucose, Bld: 123 mg/dL — ABNORMAL HIGH (ref 70–99)
Potassium: 3.3 mEq/L — ABNORMAL LOW (ref 3.5–5.1)
Potassium: 3.4 mEq/L — ABNORMAL LOW (ref 3.5–5.1)
Potassium: 4.1 mEq/L (ref 3.5–5.1)
Sodium: 138 mEq/L (ref 135–145)
Sodium: 140 mEq/L (ref 135–145)
Sodium: 140 mEq/L (ref 135–145)
Sodium: 141 mEq/L (ref 135–145)
Sodium: 141 mEq/L (ref 135–145)

## 2010-09-05 LAB — LIPASE, BLOOD: Lipase: 27 U/L (ref 11–59)

## 2010-09-05 LAB — MAGNESIUM
Magnesium: 1.7 mg/dL (ref 1.5–2.5)
Magnesium: 1.8 mg/dL (ref 1.5–2.5)

## 2010-09-09 LAB — DIFFERENTIAL
Basophils Absolute: 0.1 10*3/uL (ref 0.0–0.1)
Basophils Relative: 1 % (ref 0–1)
Eosinophils Absolute: 0.2 10*3/uL (ref 0.0–0.7)
Eosinophils Relative: 2 % (ref 0–5)
Monocytes Absolute: 0.7 10*3/uL (ref 0.1–1.0)

## 2010-09-09 LAB — URINE CULTURE
Colony Count: NO GROWTH
Culture: NO GROWTH

## 2010-09-09 LAB — URINALYSIS, ROUTINE W REFLEX MICROSCOPIC
Glucose, UA: NEGATIVE mg/dL
Protein, ur: NEGATIVE mg/dL
pH: 6 (ref 5.0–8.0)

## 2010-09-09 LAB — COMPREHENSIVE METABOLIC PANEL
AST: 18 U/L (ref 0–37)
Albumin: 3.8 g/dL (ref 3.5–5.2)
Alkaline Phosphatase: 84 U/L (ref 39–117)
BUN: 12 mg/dL (ref 6–23)
CO2: 26 mEq/L (ref 19–32)
Chloride: 100 mEq/L (ref 96–112)
GFR calc non Af Amer: >60 mL/min (ref >60)
Potassium: 4.5 mEq/L (ref 3.5–5.1)
Total Bilirubin: 0.4 mg/dL (ref 0.3–1.2)

## 2010-09-09 LAB — LIPASE, BLOOD: Lipase: 52 U/L (ref 11–59)

## 2010-09-09 LAB — CBC
Hemoglobin: 14.9 g/dL (ref 12.0–15.0)
MCHC: 34.3 g/dL (ref 30.0–36.0)
MCV: 96.4 fL (ref 78.0–100.0)
RDW: 13.8 % (ref 11.5–15.5)

## 2010-09-10 LAB — DIFFERENTIAL
Eosinophils Absolute: 0.1 10*3/uL (ref 0.0–0.7)
Eosinophils Absolute: 0.1 10*3/uL (ref 0.0–0.7)
Eosinophils Relative: 2 % (ref 0–5)
Lymphs Abs: 1.1 10*3/uL (ref 0.7–4.0)
Lymphs Abs: 2.1 10*3/uL (ref 0.7–4.0)
Monocytes Absolute: 0.4 10*3/uL (ref 0.1–1.0)
Monocytes Absolute: 0.4 10*3/uL (ref 0.1–1.0)
Monocytes Relative: 5 % (ref 3–12)
Monocytes Relative: 8 % (ref 3–12)
Neutrophils Relative %: 65 % (ref 43–77)

## 2010-09-10 LAB — BASIC METABOLIC PANEL
BUN: 5 mg/dL — ABNORMAL LOW (ref 6–23)
Chloride: 104 mEq/L (ref 96–112)
GFR calc Af Amer: >60 mL/min (ref >60)
Potassium: 3.4 mEq/L — ABNORMAL LOW (ref 3.5–5.1)

## 2010-09-10 LAB — URINALYSIS, ROUTINE W REFLEX MICROSCOPIC
Glucose, UA: NEGATIVE mg/dL
Ketones, ur: NEGATIVE mg/dL
pH: 7.5 (ref 5.0–8.0)

## 2010-09-10 LAB — CBC
HCT: 38.3 % (ref 36.0–46.0)
MCHC: 34.6 g/dL (ref 30.0–36.0)
MCV: 95.6 fL (ref 78.0–100.0)
Platelets: 246 10*3/uL (ref 150–400)
RBC: 4.01 MIL/uL (ref 3.87–5.11)
RDW: 14.1 % (ref 11.5–15.5)
WBC: 5.1 10*3/uL (ref 4.0–10.5)

## 2010-09-10 LAB — COMPREHENSIVE METABOLIC PANEL
ALT: 12 U/L (ref 0–35)
AST: 17 U/L (ref 0–37)
Albumin: 3.6 g/dL (ref 3.5–5.2)
Calcium: 9.2 mg/dL (ref 8.4–10.5)
GFR calc Af Amer: >60 mL/min (ref >60)
Glucose, Bld: 125 mg/dL — ABNORMAL HIGH (ref 70–99)
Sodium: 134 mEq/L — ABNORMAL LOW (ref 135–145)
Total Protein: 7 g/dL (ref 6.0–8.3)

## 2010-09-15 IMAGING — CR DG CHEST 2V
2 series · 2 of 2 positions shown · non-contrast
Comparison: Chest radiograph performed 05/28/2009

CLINICAL DATA: Throat pain and productive cough.

CHEST - 2 VIEW

[w chest pa]
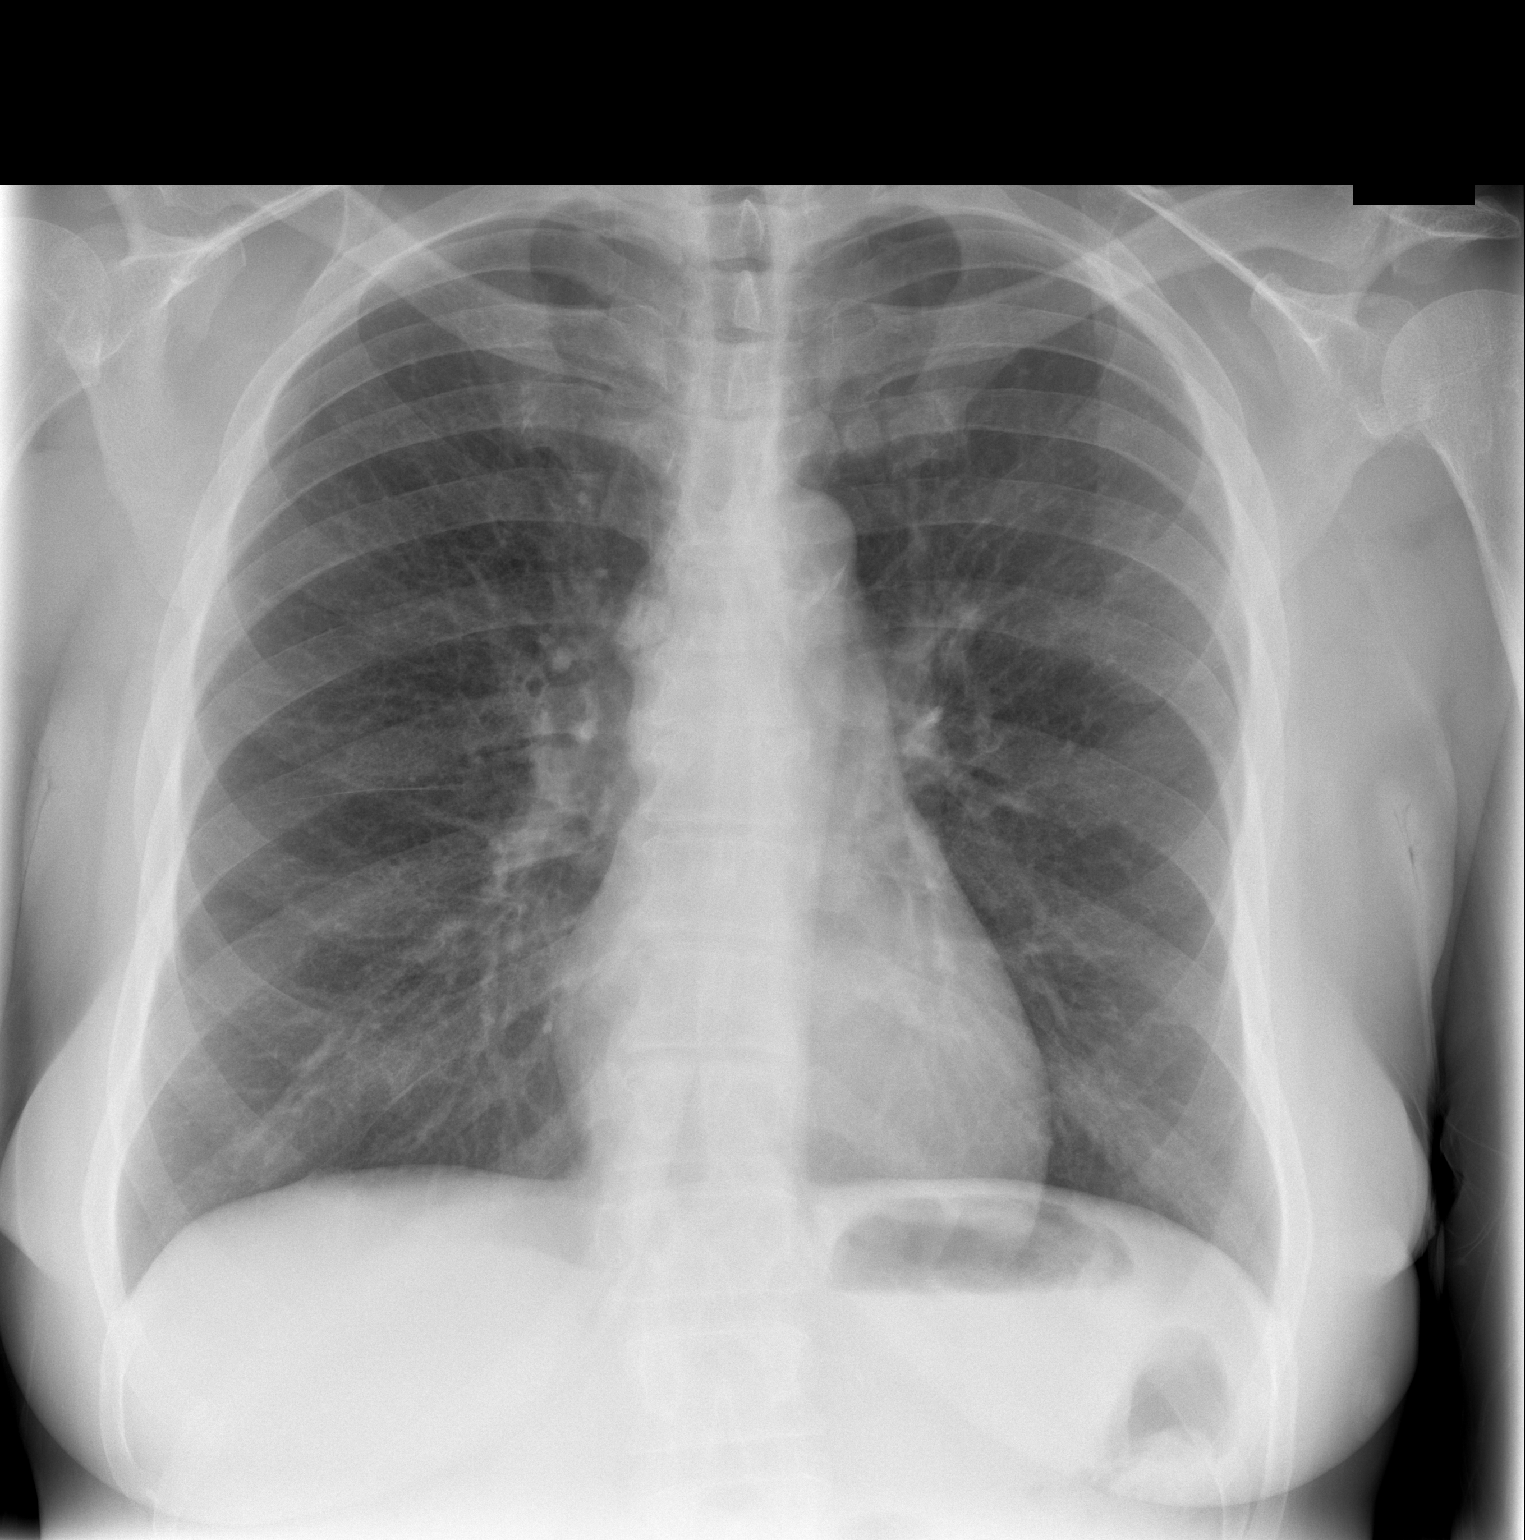

[w chest lat]
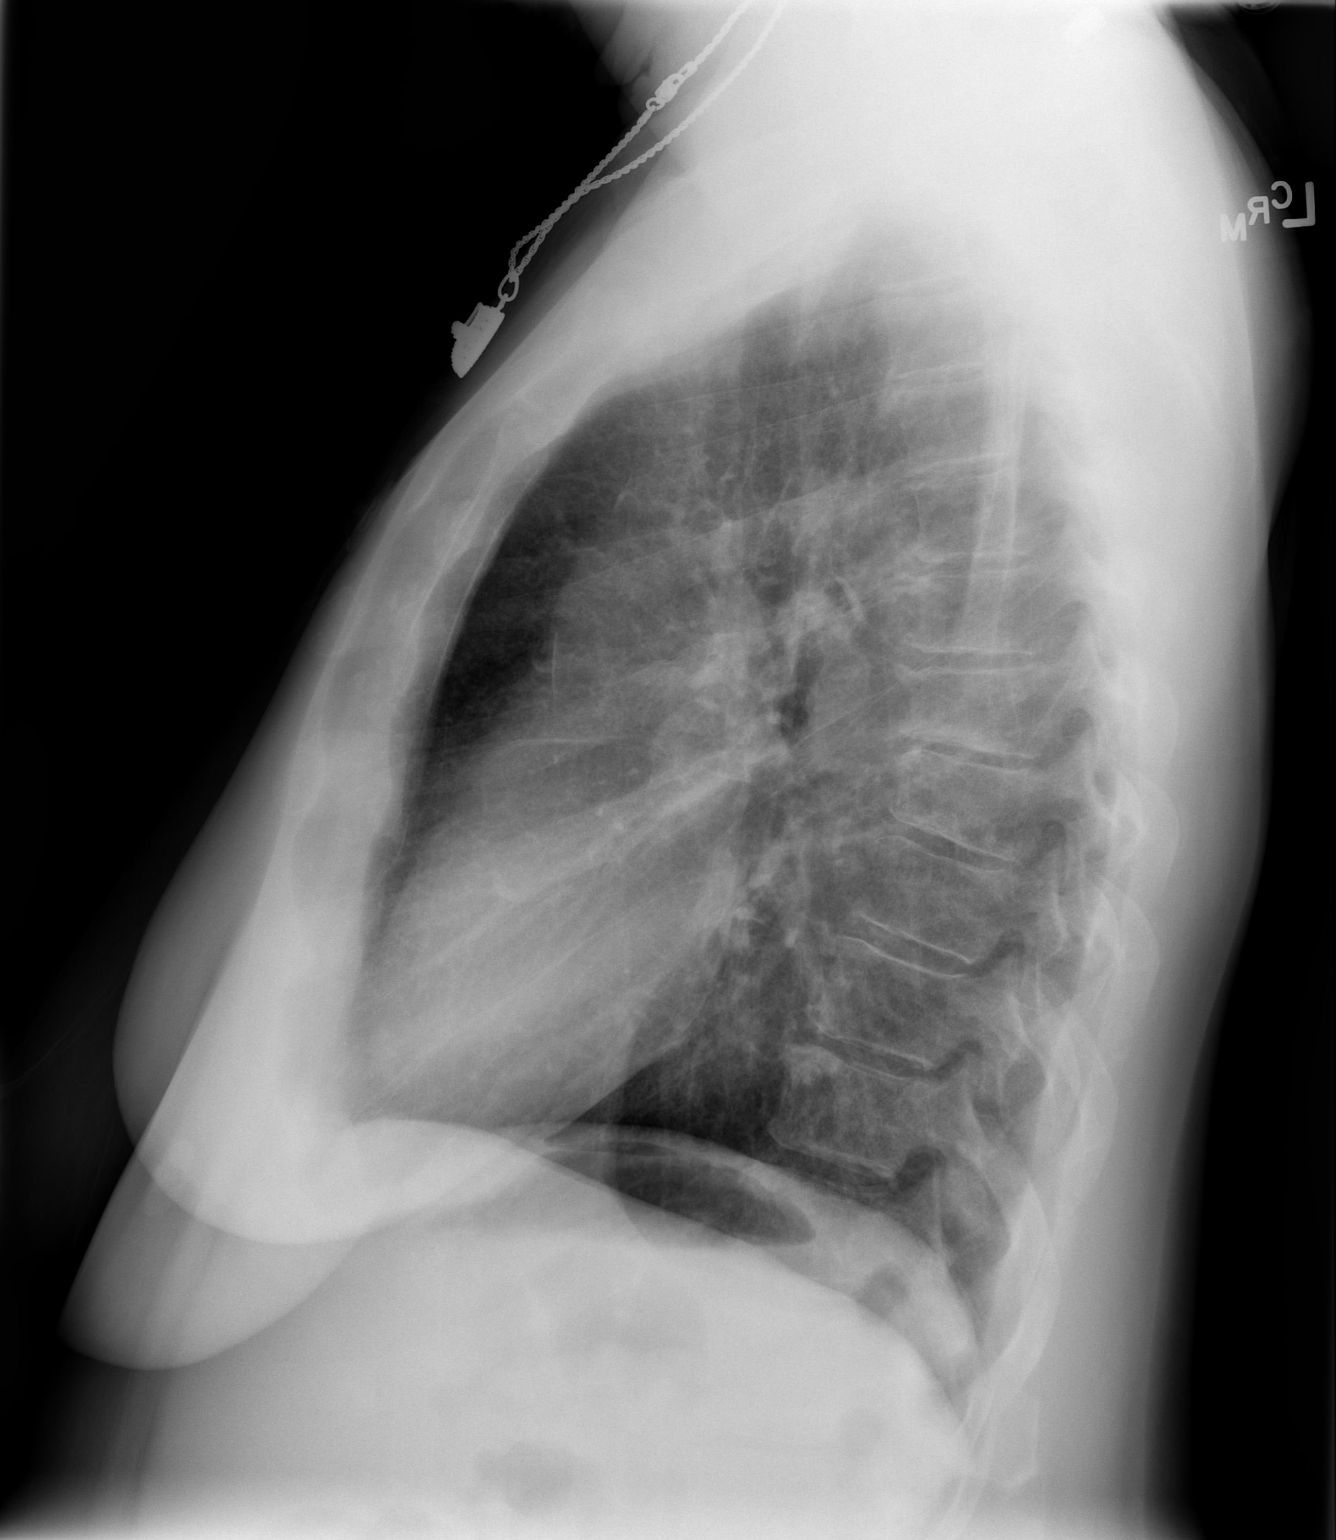

[2 of 2 positions shown; findings below may reference images not displayed]

FINDINGS: The lungs are well-aerated.  Mild stable peribronchial
thickening is noted.  There is no evidence of focal opacification,
pleural effusion or pneumothorax.

The heart is normal in size; calcification is noted within the
aortic arch.  No acute osseous abnormalities are seen.  Mild
degenerative change is noted at the mid thoracic spine.
IMPRESSION: No acute cardiopulmonary process seen.

## 2010-09-25 LAB — BASIC METABOLIC PANEL
BUN: 2 mg/dL — ABNORMAL LOW (ref 6–23)
CO2: 24 mEq/L (ref 19–32)
CO2: 26 mEq/L (ref 19–32)
Calcium: 8.6 mg/dL (ref 8.4–10.5)
Chloride: 108 mEq/L (ref 96–112)
Creatinine, Ser: 0.51 mg/dL (ref 0.4–1.2)
Glucose, Bld: 114 mg/dL — ABNORMAL HIGH (ref 70–99)
Glucose, Bld: 127 mg/dL — ABNORMAL HIGH (ref 70–99)
Potassium: 4.3 mEq/L (ref 3.5–5.1)
Sodium: 139 mEq/L (ref 135–145)

## 2010-09-25 LAB — MAGNESIUM
Magnesium: 1.6 mg/dL (ref 1.5–2.5)
Magnesium: 1.8 mg/dL (ref 1.5–2.5)

## 2010-09-26 LAB — BASIC METABOLIC PANEL
BUN: 8 mg/dL (ref 6–23)
CO2: 27 mEq/L (ref 19–32)
CO2: 28 mEq/L (ref 19–32)
CO2: 33 mEq/L — ABNORMAL HIGH (ref 19–32)
Calcium: 8.2 mg/dL — ABNORMAL LOW (ref 8.4–10.5)
Calcium: 8.5 mg/dL (ref 8.4–10.5)
Calcium: 8.6 mg/dL (ref 8.4–10.5)
Calcium: 8.6 mg/dL (ref 8.4–10.5)
Calcium: 8.8 mg/dL (ref 8.4–10.5)
Chloride: 102 mEq/L (ref 96–112)
Creatinine, Ser: 0.61 mg/dL (ref 0.4–1.2)
Creatinine, Ser: 0.65 mg/dL (ref 0.4–1.2)
Creatinine, Ser: 0.69 mg/dL (ref 0.4–1.2)
Creatinine, Ser: 0.72 mg/dL (ref 0.4–1.2)
GFR calc Af Amer: >60 mL/min (ref >60)
GFR calc Af Amer: >60 mL/min (ref >60)
GFR calc Af Amer: >60 mL/min (ref >60)
GFR calc Af Amer: >60 mL/min (ref >60)
GFR calc Af Amer: >60 mL/min (ref >60)
GFR calc non Af Amer: >60 mL/min (ref >60)
GFR calc non Af Amer: >60 mL/min (ref >60)
GFR calc non Af Amer: >60 mL/min (ref >60)
GFR calc non Af Amer: >60 mL/min (ref >60)
Glucose, Bld: 120 mg/dL — ABNORMAL HIGH (ref 70–99)
Glucose, Bld: 94 mg/dL (ref 70–99)
Potassium: 3.8 mEq/L (ref 3.5–5.1)
Potassium: 3.9 mEq/L (ref 3.5–5.1)
Potassium: 4.4 mEq/L (ref 3.5–5.1)
Sodium: 137 mEq/L (ref 135–145)
Sodium: 137 mEq/L (ref 135–145)
Sodium: 140 mEq/L (ref 135–145)

## 2010-09-26 LAB — CBC
HCT: 30.8 % — ABNORMAL LOW (ref 36.0–46.0)
HCT: 45.5 % (ref 36.0–46.0)
Hemoglobin: 10.6 g/dL — ABNORMAL LOW (ref 12.0–15.0)
Hemoglobin: 15.2 g/dL — ABNORMAL HIGH (ref 12.0–15.0)
Hemoglobin: 15.8 g/dL — ABNORMAL HIGH (ref 12.0–15.0)
MCHC: 34.4 g/dL (ref 30.0–36.0)
MCHC: 34.5 g/dL (ref 30.0–36.0)
MCV: 96.3 fL (ref 78.0–100.0)
Platelets: 238 10*3/uL (ref 150–400)
RBC: 3.2 MIL/uL — ABNORMAL LOW (ref 3.87–5.11)
RBC: 3.65 MIL/uL — ABNORMAL LOW (ref 3.87–5.11)
RBC: 3.86 MIL/uL — ABNORMAL LOW (ref 3.87–5.11)
RBC: 4.66 MIL/uL (ref 3.87–5.11)
RBC: 4.75 MIL/uL (ref 3.87–5.11)
RDW: 13.4 % (ref 11.5–15.5)
RDW: 14.2 % (ref 11.5–15.5)
WBC: 6.1 10*3/uL (ref 4.0–10.5)
WBC: 7.6 10*3/uL (ref 4.0–10.5)
WBC: 7.8 10*3/uL (ref 4.0–10.5)
WBC: 7.9 10*3/uL (ref 4.0–10.5)
WBC: 8.4 10*3/uL (ref 4.0–10.5)
WBC: 9.7 10*3/uL (ref 4.0–10.5)

## 2010-09-26 LAB — URINALYSIS, ROUTINE W REFLEX MICROSCOPIC
Bilirubin Urine: NEGATIVE
Glucose, UA: NEGATIVE mg/dL
Hgb urine dipstick: NEGATIVE
Ketones, ur: NEGATIVE mg/dL
Ketones, ur: NEGATIVE mg/dL
Nitrite: NEGATIVE
Protein, ur: 100 mg/dL — AB
Urobilinogen, UA: 0.2 mg/dL (ref 0.0–1.0)
pH: 7 (ref 5.0–8.0)

## 2010-09-26 LAB — COMPREHENSIVE METABOLIC PANEL
ALT: 17 U/L (ref 0–35)
ALT: 20 U/L (ref 0–35)
AST: 19 U/L (ref 0–37)
Alkaline Phosphatase: 96 U/L (ref 39–117)
CO2: 28 mEq/L (ref 19–32)
Calcium: 9.7 mg/dL (ref 8.4–10.5)
Chloride: 100 mEq/L (ref 96–112)
Chloride: 95 mEq/L — ABNORMAL LOW (ref 96–112)
Creatinine, Ser: 0.77 mg/dL (ref 0.4–1.2)
GFR calc non Af Amer: >60 mL/min (ref >60)
Glucose, Bld: 159 mg/dL — ABNORMAL HIGH (ref 70–99)
Glucose, Bld: 173 mg/dL — ABNORMAL HIGH (ref 70–99)
Potassium: 3.7 mEq/L (ref 3.5–5.1)
Sodium: 135 mEq/L (ref 135–145)
Total Bilirubin: 0.7 mg/dL (ref 0.3–1.2)
Total Bilirubin: 0.9 mg/dL (ref 0.3–1.2)
Total Protein: 7.3 g/dL (ref 6.0–8.3)

## 2010-09-26 LAB — DIFFERENTIAL
Basophils Absolute: 0.1 10*3/uL (ref 0.0–0.1)
Basophils Absolute: 0.1 10*3/uL (ref 0.0–0.1)
Basophils Relative: 1 % (ref 0–1)
Eosinophils Absolute: 0.1 10*3/uL (ref 0.0–0.7)
Eosinophils Absolute: 0.1 10*3/uL (ref 0.0–0.7)
Eosinophils Relative: 1 % (ref 0–5)
Lymphocytes Relative: 30 % (ref 12–46)
Lymphs Abs: 2.9 10*3/uL (ref 0.7–4.0)
Monocytes Absolute: 0.5 10*3/uL (ref 0.1–1.0)
Monocytes Relative: 6 % (ref 3–12)
Neutrophils Relative %: 59 % (ref 43–77)
Neutrophils Relative %: 63 % (ref 43–77)

## 2010-09-26 LAB — URINE MICROSCOPIC-ADD ON

## 2010-09-26 LAB — TYPE AND SCREEN
ABO/RH(D): B NEG
Antibody Screen: NEGATIVE

## 2010-09-26 LAB — HEMOCCULT GUIAC POC 1CARD (OFFICE): Fecal Occult Bld: POSITIVE

## 2010-09-26 LAB — ABO/RH: ABO/RH(D): B NEG

## 2010-09-26 LAB — PHOSPHORUS: Phosphorus: 3.6 mg/dL (ref 2.3–4.6)

## 2010-09-26 LAB — LIPASE, BLOOD: Lipase: 42 U/L (ref 11–59)

## 2010-10-04 LAB — GLUCOSE, CAPILLARY: Glucose-Capillary: 86 mg/dL (ref 70–99)

## 2010-10-19 ENCOUNTER — Other Ambulatory Visit (HOSPITAL_COMMUNITY): Payer: Self-pay | Admitting: Family Medicine

## 2010-10-19 DIAGNOSIS — Z1231 Encounter for screening mammogram for malignant neoplasm of breast: Secondary | ICD-10-CM

## 2010-10-27 ENCOUNTER — Ambulatory Visit (HOSPITAL_COMMUNITY): Payer: Self-pay

## 2010-10-30 ENCOUNTER — Ambulatory Visit (HOSPITAL_COMMUNITY): Payer: BC Managed Care – PPO | Attending: Family Medicine

## 2010-11-07 NOTE — Op Note (Signed)
NAME:  Morgan Torres, Morgan Torres                ACCOUNT NO.:  [id number]   MEDICAL RECORD NO.:  [id number]          PATIENT TYPE:  AMB   LOCATION:  DAY                           FACILITY:  [facility]   PHYSICIAN:  J. Darreld Mclean, M.D. DATE OF BIRTH:  12/05/1950   DATE OF PROCEDURE:  DATE OF DISCHARGE:                               OPERATIVE REPORT   PREOPERATIVE DIAGNOSIS:  Tear of right knee, medial meniscus.   POSTOPERATIVE DIAGNOSIS:  Tear of right knee, medial meniscus.   PROCEDURES:  Operative arthroscopy, partial medial meniscectomy of the  right knee.   ANESTHESIA:  General.   SURGEON:  J. Darreld Mclean, MD   No drains.   INDICATIONS:  The patient is a 60 year old female with pain and  tenderness in her right knee.  She had arthroscopy of the right knee in  2006.  She has done well until recently when she started having giving  away and pain in the knee.  She states it felt like it did several years  ago.  MRI was done, which shows no recurrent tear of the posterior horn  of the medial meniscus with degenerative joint disease.  She has not  improved by conservative treatment.  She prefers to have surgery.  She  understands the risks and imponderables.   DESCRIPTION OF PROCEDURE:  The patient was seen in the holding area.  The right knee was identified as the correct surgical site.  She placed  mark on the right knee and I placed a mark on the right knee.  She was  brought back to the operating room.  She was placed supine on the  operating room table.  She was given general anesthesia.  Leg holder and  tourniquet was placed, deflated right upper thigh.  She was prepped and  draped in the usual manner.   We had a generalized time-out identifying the patient as Morgan Torres.  That the right knee was the correct surgical site.  Surgical team was  for me with everyone.  There were no major issues present.  All  instrumentation was deemed to be properly working and in place.   The  patient's leg was then elevated, wrapped circumferentially with an  Esmarch bandage.  Tourniquet inflated to 300 mmHg.  Esmarch bandage  removed.  Inflow cannula inserted medially and lactated Ringer's  instilled in the knee by an infusion pump.  The arthroscope was inserted  laterally, and the knee was systematically examined.  Permanent pictures  were taken.   Suprapatellar pouch shows mild synovitis.  There was grade 2 changes  undersurface of the patella.  Medially, there were grade 3 to 4 changes  over the articular surface of the femoral condyle, permanent pictures  were taken of this.  There was a tear in the posterior horn of the  medial meniscus was stellate-type tear.  There were no loose bodies.  Tibial plateau and grade 2-3 changes.  Anterior cruciate was intact.  Laterally, the knee looked good.  There was no tear of the meniscus with  just slight fraying with grade 2 changes.  There were no loose bodies.   Attention directed back to the medial side and using a meniscal shaver,  a meniscal punch, a good smooth contour was obtained.  Permanent  pictures were taken.  The knee was systematically reexamined and no new  pathology found.  No loose bodies found.  Wounds were irrigated with  remaining part of lactated Ringer's.  The wounds were reapproximated  using 3-0 nylon interrupted vertical mattress manner.  Marcaine 0.25%  instilled into each portal.  Tourniquet deflated after 17 minutes.  Bulky dressing applied, knee dressing, and Ace bandage applied.  The  patient tolerated the procedure well.  Given a prescription for Norco.  I will see her in the office in approximately 10 days or 2 weeks.  Physical therapy has been arranged as an outpatient.  If she has any  difficulty, she is to contact me through the office or the hospital  beeper system.           ______________________________  Shela Commons. Darreld Mclean, M.D.     JWK/MEDQ  D:  02/17/2008  T:  02/17/2008  Job:   [id number]

## 2010-11-07 NOTE — H&P (Signed)
NAME:  Morgan Torres, Morgan Torres                ACCOUNT NO.:  [id number]   MEDICAL RECORD NO.:  [id number]          PATIENT TYPE:  AMB   LOCATION:  DAY                           FACILITY:  [facility]   PHYSICIAN:  J. Darreld Mclean, M.D. DATE OF BIRTH:  [birth year]   DATE OF ADMISSION:  DATE OF DISCHARGE:  LH                              HISTORY & PHYSICAL   CHIEF COMPLAINT:  Pain and tenderness in the right knee.   The patient is a 60 year old female with pain and tenderness in her  right knee on and off for the last several months.  She had a right knee  partial medial meniscectomy by Dr. Romeo Apple in 2006.  She has done well  until just recently.  I saw her in my office on August 3.  She had pain  and tenderness, the knee was giving way and swelling.  She had no  locking.  Range of motion was decreased.  She had effusion and crepitus.  I was concerned about a tear to the medial meniscus reoccurring.  I  aspirated her knee of 20 mL of clear fluid and gave her Depo-Medrol 40,  1 mL.  She had an MRI at the hospital on January 29, 2008.  MRI showed a  complex tear of the posterior horn and mid body medial meniscus with  tricompartmental arthritis changes.  I informed her of the findings on  August 11.  Her knee was doing better and she wanted to talk to her  employer to set up a time.  She returned to the office on August 20 and  wants to set up the surgery for August 25.   PAST HISTORY:  Positive for nerve disorder and hypertension.   The patient smokes and uses alcoholic beverages socially.   ALLERGIES:  CODEINE.   MEDICATIONS:  1. Benicar 10 mg daily.  2. Ibuprofen 800 mg daily.  3. Tylox as needed.   She had back surgery 6 years ago and of course the knee surgery in 2006.  Cancer and hypertension run in the family.   PHYSICAL EXAMINATION:  GENERAL:  Patient is alert, cooperative,  oriented.  VITAL SIGNS:  Normal.  HEENT: Negative.  NECK:  Supple.  LUNGS: Clear to P&A.  HEART: Regular  without murmur heard.  ABDOMEN:  Soft and nontender without masses.  EXTREMITIES:  Right knee has swelling, effusion, pain and tenderness  medially.  Positive McMurray medially.  Other extremities are negative.  CNS: Intact.  SKIN:  Intact.   IMPRESSION:  Tear medial meniscus right knee reoccurrence.   PLAN:  Operative arthroscopy of the right knee, partial medial  meniscectomy.  The patient has undergone the procedure before and  understands the procedure.  I have gone over the risks and  imponderables.  Her labs are pending.                                            ______________________________  J. Darreld Mclean, M.D.  JWK/MEDQ  D:  02/16/2008  T:  02/16/2008  Job:  [id number]

## 2010-11-10 NOTE — Op Note (Signed)
[place]. [facility]  Patient:    Morgan Torres, Morgan Torres Visit Number: [id number] MRN: [id number]          Service Type: DSU Location: [phone] Attending Physician:  Donn Pierini Dictated by:   Julio Sicks, M.D. Proc. Date: 07/30/01 Admit Date:  07/30/2001                             Operative Report  PREOPERATIVE DIAGNOSIS:  Right L5-S1 herniated nucleus pulposus with radiculopathy.  POSTOPERATIVE DIAGNOSIS:  Right L5-S1 herniated nucleus pulposus with radiculopathy.  OPERATION PERFORMED:  Right L5-S1 laminotomy and microdiskectomy.  SURGEON:  Julio Sicks, M.D.  ASSISTANT:  Bradd Canary., M.D.  ANESTHESIA:  General endotracheal.  INDICATIONS FOR PROCEDURE:  Morgan Torres is a 60 year old female with a history of right lower extremity pain, paresthesias and weaknesses and a right-sided L5 radiculopathy which has failed conservative management.  MRI scan demonstrates evidence of a rightward L5-S1 foraminal disk herniation causing compression of the right-sided L5 nerve root as it exits into the foramen.  We have discussed options available for management including the possibility of operative and nonoperative management.  The patient is aware of the risks and benefits involved with a right-sided, L5-S1 laminotomy and microdiskectomy and wishes to proceed.  DESCRIPTION OF PROCEDURE:  The patient was taken to the operating room and placed on the operating table in supine position.  After an adequate level of anesthesia was achieved, the patient was positioned prone onto a Inboden frame and appropriately padded.  The patients lumbar region was shaved and prepped sterilely.  A 10 blade was used to make a linear skin incision overlying the L5-S1 interspace.  This was carried down sharply in the midline. Subperiosteal dissection was performed on the left side exposing the lamina and facet joints at L5 and S1.  The deep self-retaining retractor  was placed. Intraoperative X-ray was taken and the level was confirmed.  A laminotomy was then performed using a high speed drill and Kerrison rongeurs to remove the inferior two thirds of the lamina at L5, the medial one third of the L5-S1 facet joint and the superior rim of the S1 lamina.  The ligamentum flavum was elevated and resected in piecemeal fashion using Kerrison rongeurs.  The underlying thecal sac and exiting S1 nerve root were identified.  Microscope was brought into the field and used for microdissection of the interspace and the nerve roots.  Epidural venous plexus was coagulated and cut.  The thecal sac and S1 nerve root were mobilized and retracted towards the midline.  The disk space was readily apparent.  Off laterally, there was evidence of a preforaminal disk herniation.  The disk space and disk herniation were then incised with a 15 blade in rectangular fashion.  A wide disk space clean-out was then achieved using pituitary rongeurs, upward angled pituitary rongeurs and Epstein curets.  All elements of the superior disk herniation were completely resected as were some associated osteophytes.  The inferior aspect of the L5 nerve root was identified.  The L5 nerve root appeared to be well decompressed after a very thorough diskectomy.  There was no evidence of injury to thecal sac or nerve roots.  There was no evidence of any continued compression of the right-sided L5 nerve root.  The wound was then irrigated with antibiotic solution.  Gelfoam was placed topically for hemostasis which was found to be good.  The  microscope and retractor system were removed. Hemostasis in the muscle achieved with electrocautery.  The wound was then closed in layers with Vicryl sutures.  Steri-Strips and sterile dressing were applied.  There were no apparent complications.  The patient tolerated the procedure well and she returned to the recovery room postoperatively. Dictated by:   Julio Sicks, M.D. Attending Physician:  Donn Pierini DD:  07/30/01 TD:  07/31/01 Job: [id number]

## 2010-11-10 NOTE — H&P (Signed)
NAME:  Morgan Torres, Morgan Torres                ACCOUNT NO.:  [id number]   MEDICAL RECORD NO.:  [id number]          PATIENT TYPE:  AMB   LOCATION:  DAY                           FACILITY:  [facility]   PHYSICIAN:  Vickki Hearing, M.D.DATE OF BIRTH:  Nov 13, 1950   DATE OF ADMISSION:  DATE OF DISCHARGE:  LH                                HISTORY & PHYSICAL   CHIEF COMPLAINT:  Right knee pain.   Ms. Morgan Torres is 60 years old.  She had two weeks of gradual onset of  pain in her right knee.  This progressed to mechanical symptoms, and she  presented to her medical physician, Dr. Early Chars, for evaluation.  Eventually,  an MRI was obtained, and it showed an oblique tear of the posterior horn of  the medial meniscus.  We talked about this and decided to go ahead and  proceed with an arthroscopy of the right knee with medial meniscectomy.  She  understands that she may have difficulty if we find a significant amount of  arthritis.   REVIEW OF SYSTEMS:  Reveals numbness, weakness, unsteady gait, joint  swelling and seasonal allergies.  The other seven systems reviewed were  normal.   ALLERGIES:  She does report an allergy to VICODIN.   CURRENT MEDICATIONS:  1.  Lyrica 50 mg.  2.  Benazepril 10 mg.  3.  Hydrochlorothiazide 25 mg.  4.  Vitamin B6 50 mg.  5.  Isoniazid 300 mg.  6.  One baby aspirin per day.   PREVIOUS SURGICAL PROCEDURES:  Include a back procedure done by Dr. Renae Fickle  three years ago.   FAMILY HISTORY:  Heart, lung and kidney disease.  Arthritis, cancer, asthma  and diabetes.   CURRENT OCCUPATION:  CNA.   SOCIAL HABITS:  Alcohol:  No.  Smoking:  Yes.  Caffeine:  Yes.  Educational  Level:  Grade 12.   PHYSICAL EXAMINATION:  VITAL SIGNS:  Weight 230.  Pulse 76, respiratory rate  18.  GENERAL APPEARANCE:  Normal development, grooming, hygiene and nutrition.  Body habitus meso to endomorphic.  PSYCHOLOGICAL:  Normal.  NEUROLOGIC:  Findings normal.  CARDIOVASCULAR:  Normal pulse,  perfusion and temperature.  No edema.  SKIN:  Normal.  EXTREMITIES:  The patient had medial joint line tenderness and pain with  full extension of the knee.  Positive McMurray's sign.  With 5-115 active  motion, 0-130 passive motion.  Strength and stability were normal.  Opposite  extremity with normal range of motion, strength, stability and alignment.  Upper extremities:  No contractures, atrophy, tremor or subluxation.   RADIOGRAPHIC EVALUATION:  MRI:  Torn medial meniscus.   DIAGNOSIS:  Torn medial meniscus, right knee.   PLAN:  Arthroscopy and medial meniscectomy, right knee.      SEH/MEDQ  D:  10/09/2004  T:  10/09/2004  Job:  [id number]

## 2010-11-10 NOTE — Discharge Summary (Signed)
[place]. [facility]  Patient:    Morgan Torres, Morgan Torres Visit Number: [id number] MRN: [id number]          Service Type: MED Location: [phone] Attending Physician:  Katy Apo. Dictated by:   Renford Dills, M.D. Admit Date:  12/07/2001 Discharge Date: 12/11/2001   CC:         Duncan Dull, M.D.   Discharge Summary  DISCHARGE DIAGNOSES: 1. Asthma exacerbation, resolved at the time of discharge, questionable mild    chronic obstructive pulmonary disease. 2. Tobacco abuse. 3. High cholesterol on treatment.  DISCHARGE MEDICATIONS: 1. Azithromycin 250 mg one daily. 2. Combivent MDI two puffs every four to six hours as needed times one week    and then p.r.n. 3. Lotensin 10 mg one daily. 4. Zocor 80 mg one daily. 5. Advair 250/50 one inhalation every 12 hours. 6. Nasonex two sprays per nostril daily. 7. Prednisone 10 mg on a tapering dosage, 4 tabs a day for two days,    then 3 tabs a day for two days, then 2 tabs a day for two days, and    then 1 tab a day for two days and then stop.  DIET:  The patient has no dietary restrictions.  SPECIAL INSTRUCTIONS:  She is advised to stop smoking.  FOLLOW-UP:  Follow up appointment with her M.D. on December 29, 2001 at 11:30.  HISTORY OF PRESENT ILLNESS:  A 60 year old black female with history of asthma, hypertension, high cholesterol who continues to smoke, who had symptoms of increasing shortness of breath and wheeze failed to resolve with nebulized treatments.  The patient was seen in the ED and evaluated and treated with nebulizers times three and still was in respiratory distress. Admission was deemed necessary for further evaluation and treatment. Of note, the patients chest x-ray was without infiltrate.  Please see dictated H&P for further details of the history of present illness.  HOSPITAL COURSE:  #1 - ASTHMA EXACERBATION PLUS OR MINUS CHRONIC OBSTRUCTIVE PULMONARY DISEASE AS THE PATIENT IS A  CHRONIC SMOKER:  The patient was admitted to medicine floor, treated with steroids, antibiotic, O2 and nebulizers.  There were no major complications during this hospitalization.  The patient did have mild hypoglycemia secondary to steroids.  No major fluctuations.  The patients peak flows improved from 250 pre and post nebulizer treatment to a best of about 300 to 350.  During this hospitalization the patient had a significant decrease in wheeze, decrease in work of breathing and increase of functional capacity, ie ability to ambulate in room and hallway without problems. On December 11, 2001 the patient was deemed medically stable for discharge to home.  She had an outpatient follow up with her primary M.D. as outlined.  The patient has several other chronic medical problems which were stable throughout this hospitalization.  LABORATORY DATA:  The patient had a CBC within normal limits.  BMET within normal limits.  Chest x-ray showed no apparent disease.  EKG:  No acute changes. Dictated by:   Renford Dills, M.D. Attending Physician:  Renford Dills D. DD:  12/11/01 TD:  12/12/01 Job: [id number]

## 2010-11-10 NOTE — H&P (Signed)
Morgan Torres. Morgan Torres  Patient:    Morgan Torres, Morgan Torres Visit Number: [id number] MRN: [id number]          Service Type: MED Location: [phone] Attending Physician:  Morgan Torres Dictated by:   Morgan Torres Morgan Torres, M.D. Admit Date:  12/07/2001                           History and Physical  CHIEF COMPLAINT:  Shortness of breath.  HISTORY OF PRESENT ILLNESS:  Mrs. Morgan Torres is a 60 year old black female with a history of asthma, hypertension, and high cholesterol who complains of three days of increasing shortness of breath.  She started with cold symptoms of cough and congestion and started using her albuterol inhaler, but continued to get worse.  Day of admission she was still tight after several albuterol inhalations, presented to the emergency room.  After being treated with three nebulizer treatments in the emergency room she remained short of breath and in distress.  PAST MEDICAL HISTORY:  Significant for hypertension, elevated cholesterol, asthma which has generally only required occasional albuterol use, though she has been admitted to the hospital in the past with an acute asthmatic exacerbation.  Had recent back surgery in February and is status post hysterectomy.  Also had two childbirths.  SOCIAL HISTORY:  She is a Agricultural engineer at [employer], but has been on medical leave of absence since her surgery on her back last February and was planning to go back to work next month.  She is divorced.  Lives by herself. Has two grandchildren and four grandchildren.  Has smoked a pack a day but states she wants to quit now.  She does not drink alcohol.  FAMILY HISTORY:  Significant for her mother having diabetes, father having died of cancer of the bone, asthma in a brother.  Negative for breast or colon cancer.  REVIEW OF SYSTEMS:  Denies headaches, change in bowel habits or urinary symptoms.  Denies nausea, chest pain, or  diarrhea.  OBJECTIVE:  VITAL SIGNS:  Normal temperature, blood pressure 147/78, pulse 85, respirations 24, O2 saturation on room air 98%.  GENERAL:  She is alert and oriented x3.  HEENT:  Sclerae anicteric.  Conjunctivae pink.  Oropharynx is benign.  TMs without effusions.  NECK:  Supple without adenopathy or thyromegaly.  LUNGS:  Scattered wheezes and rales throughout with I:E of 1:3.  CARDIAC:  Regular rate and rhythm without murmur.  ABDOMEN:  Soft, obese, nontender.  No hepatosplenomegaly or masses.  EXTREMITIES:  Trace edema.  No cyanosis.  NEUROLOGIC:  Cranial nerves II-XII are intact.  Motor and sensory are grossly intact.  LABORATORIES:  Chest x-ray shows flattened diaphragms bilaterally, some increased pulmonary markings, but no acute infiltrates, and a normal cardiac silhouette.  Laboratories are pending at this time.  ASSESSMENT AND PLAN: 1. Status asthmaticus.  Plan is to admit for observation, frequent albuterol    nebulizer treatments.  Will continue on p.o. prednisone.  She has had 60 mg    in the emergency room already.  Continue the course of Zithromax which was    already started in the emergency room.  Monitor her O2 saturations and peak    flows and monitor glucose on steroids as she has had some glucose    intolerance in the past. 2. Hypertension.  Will continue her on her Lotensin and monitoring and again    expect possible exacerbation on the steroids. 3.  Smoking.  Discussed smoking cessation and strongly urge her to continue    that in the future.  Will assist her with that as an outpatient. 4. High cholesterol.  Will continue on Zocor at current dose. Dictated by:   Morgan Torres. Morgan Torres, M.D. Attending Physician:  Morgan Torres DD:  12/07/01 TD:  12/09/01 Job: [id number]

## 2010-11-10 NOTE — Discharge Summary (Signed)
[place]. [facility]  Patient:    Morgan Torres, Morgan Torres Visit Number: [id number] MRN: [id number]          Service Type: MED Location: 2000 2011 01 Attending Physician:  Glennon Hamilton Dictated by:   Guy Franco, P.A. [facility] Admit Date:  05/25/2001 Discharge Date: 05/27/2001   CC:         Dr. Glenis Smoker in [facility]   Referring Physician Discharge [facility]  DATE OF BIRTH:  1951/01/22  DISCHARGE DIAGNOSES: 1. Atypical chest pain. 2. Hypertension. 3. Bradycardia, resolved. 4. Tobacco abuse.  PROCEDURES:  Stress Cardiolite, May 26, 2001:  Negative for ischemia.  HOSPITAL COURSE:  Ms. Belgarde is a 60 year old female who was admitted on May 25, 2001 after complaining of a one-week history of intermittent substernal chest pain/heaviness.  She described the pain as sharp with no radiation, but did have associated symptoms such as dyspnea.  She presented to the ER on May 25, 2001, and in the emergency room sublingual nitroglycerin decreased the chest pain.  While in the hospital she underwent cardiac isoenzymes testing which were negative.  D-dimer 0.25.  Total cholesterol 249, LDL 176, HDL 55, triglycerides 92.  As part of her workup, EKG revealed normal sinus rhythm, rate 73, no acute ST-T wave changes.  In addition, she underwent a stress Cardiolite on May 26, 2001 and it was negative for ischemia. Therefore, she was discharged to home with instructions to follow up with her primary care physician, Dr. Glenis Smoker.  In addition, the patient was noted to have sinus bradycardia with rates as low as 47 during her hospital stay.  For this reason her Ziac was discontinued and her antihypertensive was changed to Lotensin.  DISCHARGE MEDICATIONS: 1. Premarin resume as prior to admission. 2. Lotensin 10 mg one p.o. q.d. 3. Zocor 40 mg one p.o. q.h.s.  She is to stop her Ziac.  ACTIVITY:  As tolerated.  DIET:  Remain on a low fat/low salt  diet.  FOLLOW-UP:  She will follow up with Dr. Andee Lineman as needed and needs to call Dr. Glenis Smoker for an appointment to have her blood levels checked (CMET/lipids).  DISPOSITION:  She is discharged to home in stable condition.Dictated by:   Guy Franco, P.A. [facility] Attending Physician:  Glennon Hamilton DD:  05/27/01 TD:  05/27/01 Job: [id number]

## 2010-11-10 NOTE — Op Note (Signed)
NAME:  Morgan Torres, Morgan Torres                ACCOUNT NO.:  [id number]   MEDICAL RECORD NO.:  [id number]          PATIENT TYPE:  AMB   LOCATION:  DAY                           FACILITY:  [facility]   PHYSICIAN:  Vickki Hearing, M.D.DATE OF BIRTH:  [birth year]   DATE OF PROCEDURE:  10/10/2004  DATE OF DISCHARGE:                                 OPERATIVE REPORT   PREOPERATIVE DIAGNOSIS:  Torn medial meniscus, right knee.   POSTOPERATIVE DIAGNOSIS:  Torn medial meniscus, right knee.   OPERATION PERFORMED:  Surgical arthroscopy right knee.  Partial medial  meniscectomy.   SURGEON:  Vickki Hearing, M.D.   ASSISTANT:  None.   ANESTHESIA:  General.   OPERATIVE FINDINGS:  Torn medial meniscus at the posterior horn, complex  degenerative type tear.  There were mild chondral changes on the plateaus,  lateral tibia and medial tibia, very minimal changes on the medial femoral  condyle and the medial facet of the patella.  These did not require  treatment.   INDICATIONS FOR PROCEDURE:  Pain.  Positive MRI for medial meniscal tear,  clinical findings of medial meniscal tear.   DESCRIPTION OF PROCEDURE:  The patient was identified at [facility] in the  preop holding area.  The right knee was marked as the surgical site,  countersigned by the surgeon.  She was given Ancef and taken to the  operating room for general anesthesia.  In the preop holding area we did  update her history and physical.   In the operating room after general anesthesia, she had a sterile prep and  drape of the right lower extremity.  Then procedure was confirmed as right  knee arthroscopy for medial meniscal tear on Morgan Torres.  Antibiotics were  given within an hour of skin incision and equipment needed for the procedure  was confirmed to be in the room.   A two incision diagnostic arthroscopy was performed.  Torn medial meniscus  was encountered.  Using a straight duck bill forceps and a motorized shaver,  the  meniscus tear was resected and the remaining rim was balanced to a  stable rim.  The remaining debris in the knee was washed and suctioned out.  The meniscus was probed for final check, found to be stable and the scope  was removed from the knee.  Portals were closed with Steri-Strips and the  knee was injected with Sensorcaine.   Cryocuff and sterile dressing was applied.  The patient was extubated and  taken to recovery room in stable condition.      SEH/MEDQ  D:  10/10/2004  T:  10/10/2004  Job:  [id number]

## 2010-11-10 NOTE — H&P (Signed)
NAME:  Morgan Torres, Morgan Torres                ACCOUNT NO.:  [id number]   MEDICAL RECORD NO.:  [id number]          PATIENT TYPE:  AMB   LOCATION:  DAY                           FACILITY:  [facility]   PHYSICIAN:  Jerolyn Shin C. Katrinka Blazing, M.D.   DATE OF BIRTH:  1950-09-15   DATE OF ADMISSION:  DATE OF DISCHARGE:  LH                                HISTORY & PHYSICAL   HISTORY OF PRESENT ILLNESS:  A 60 year old female referred for screening  colonoscopy by Dr. Early Chars.  She has a history of constipation.  There is also  a history of weight loss but she states that this is intentional weight  loss.  There is no history of rectal bleeding.  There is no family history  of colon cancer.  The patient is scheduled for screening colonoscopy.   PAST MEDICAL HISTORY:  1.  Hypertension.  2.  Low back pain.  3.  History of positive PPD conversion.   MEDICATIONS:  1.  INH 300 mg daily.  2.  Hydrochlorothiazide 25 mg daily.  3.  Benazepril 10 mg daily.  4.  Aspirin 81 mg daily.  5.  B6 50 mg daily.   PAST SURGICAL HISTORY:  1.  Lumbar disk surgery 2003.  2.  Hysterectomy.  3.  Tonsillectomy.   ALLERGIES:  HYDROCODONE which causes itching.   REVIEW OF SYSTEMS:  Positive for personal stresses and anxiety.  She also  has chronic low back pain and leg pain.   FAMILY HISTORY:  Positive history for emphysema, breast cancer, lung cancer,  diabetes, hypertension, and atherosclerotic heart disease.   PHYSICAL EXAMINATION:  VITAL SIGNS:  Blood pressure 180/90, pulse 62,  respirations 20, weight 214 pounds.  HEENT:  Unremarkable.  NECK:  Supple.  No JVD, bruits, adenopathy, or thyromegaly.  CHEST:  Clear to auscultation.  HEART:  Regular rate and rhythm without murmur, gallop or rub.  ABDOMEN:  Soft, nontender.  No masses.  EXTREMITIES:  No cyanosis, clubbing or edema.  NEUROLOGIC:  No focal motor, sensory or cerebellar deficits.   IMPRESSION:  1.  Chronic constipation.  2.  Needs screening colonoscopy.  3.   Hypertension.  4.  Positive conversion of PPD.  5.  Chronic low back pain.   PLAN:  Screening colonoscopy.      Dirk Dress. Katrinka Blazing, M.D.  Electronically Signed     LCS/MEDQ  D:  03/14/2005  T:  03/15/2005  Job:  [id number]   cc:   Dorthula Rue. Early Chars, MD  Fax: [phone]   [facility]  Fax: [phone]

## 2010-11-13 ENCOUNTER — Ambulatory Visit (HOSPITAL_COMMUNITY)
Admission: RE | Admit: 2010-11-13 | Discharge: 2010-11-13 | Disposition: A | Discharge status: Home / Self Care | Payer: Self-pay | Source: Ambulatory Visit | Attending: Family Medicine | Admitting: Family Medicine

## 2010-11-13 DIAGNOSIS — Z1231 Encounter for screening mammogram for malignant neoplasm of breast: Secondary | ICD-10-CM | POA: Insufficient documentation

## 2010-12-03 ENCOUNTER — Inpatient Hospital Stay (INDEPENDENT_AMBULATORY_CARE_PROVIDER_SITE_OTHER)
Admission: RE | Admit: 2010-12-03 | Discharge: 2010-12-03 | Disposition: A | Discharge status: Home / Self Care | Payer: BC Managed Care – PPO | Source: Ambulatory Visit | Attending: Emergency Medicine | Admitting: Emergency Medicine

## 2010-12-03 DIAGNOSIS — H60399 Other infective otitis externa, unspecified ear: Secondary | ICD-10-CM

## 2010-12-03 DIAGNOSIS — J069 Acute upper respiratory infection, unspecified: Secondary | ICD-10-CM

## 2010-12-03 LAB — POCT RAPID STREP A: Streptococcus, Group A Screen (Direct): NEGATIVE

## 2011-06-08 ENCOUNTER — Other Ambulatory Visit (HOSPITAL_COMMUNITY)
Admission: RE | Admit: 2011-06-08 | Discharge: 2011-06-08 | Disposition: A | Discharge status: Home / Self Care | Payer: BC Managed Care – PPO | Source: Ambulatory Visit | Attending: Family Medicine | Admitting: Family Medicine

## 2011-06-08 ENCOUNTER — Other Ambulatory Visit: Payer: Self-pay | Admitting: Family Medicine

## 2011-06-08 DIAGNOSIS — Z01419 Encounter for gynecological examination (general) (routine) without abnormal findings: Secondary | ICD-10-CM | POA: Insufficient documentation

## 2011-06-08 DIAGNOSIS — Z1159 Encounter for screening for other viral diseases: Secondary | ICD-10-CM | POA: Insufficient documentation

## 2012-05-02 ENCOUNTER — Encounter (HOSPITAL_COMMUNITY): Payer: Self-pay | Admitting: Emergency Medicine

## 2012-05-02 ENCOUNTER — Emergency Department (INDEPENDENT_AMBULATORY_CARE_PROVIDER_SITE_OTHER)
Admission: EM | Admit: 2012-05-02 | Discharge: 2012-05-02 | Disposition: A | Discharge status: Home / Self Care | Payer: Self-pay | Source: Home / Self Care

## 2012-05-02 DIAGNOSIS — I10 Essential (primary) hypertension: Secondary | ICD-10-CM

## 2012-05-02 DIAGNOSIS — J111 Influenza due to unidentified influenza virus with other respiratory manifestations: Secondary | ICD-10-CM

## 2012-05-02 MED ORDER — DEXTROMETHORPHAN HBR 15 MG/5ML PO SYRP: 10.0000 mL | ORAL_SOLUTION | Freq: Four times a day (QID) | ORAL | Status: DC | PRN

## 2012-05-02 MED ORDER — OSELTAMIVIR PHOSPHATE 75 MG PO CAPS: 75.0000 mg | ORAL_CAPSULE | Freq: Two times a day (BID) | ORAL | Status: DC

## 2012-05-02 NOTE — ED Notes (Signed)
Pt c/o cold sx x1 day... Sx include: dry cough, chills, headaches, body aches, feeling warm but did not take temp... Denies: vomiting, diarrhea, sore throat, congestion, blurry vision, chest pains, edema, SOB... Pt is alert w/no signs of distress.... Has taken advil for the discomfort

## 2012-05-02 NOTE — ED Provider Notes (Signed)
CC:   Cough, body sore  HPI:  62 y.o. with cough and general achiness since yesterday. Feels hot and cold alternately. No sore throat, mild nasal drainage. No nausea/vomiting. No sick contacts. Has not had flu shot yet. No sputum. Headache, wheezy. No chest pain, shortness of breath. Noted to have elevated BP on arrival. Pt not taking meds for BP for a few months. Lost weight and thinks she doesn't need BP meds.  Last saw PCP about 6 months ago.  PMH:    HTN resolved with wt loss not on meds now.  PSH:   Knee, intestinal obstruction, back surgery, hysterectomy.  SOC:  Smokes 1 ppd. Occasional alcohol, no other drugs Fam HX:  HTN, mom - DM, Cancer, heart disease   EXAMS: Filed Vitals:   05/02/12 2047  BP: 211/88  Pulse: 60  Temp: 98.2 F (36.8 C)  Resp: 18   GEN:  WNWD, no acute distress HEENT:  NCAT, PERRLA, EOMI NECK:  Supple, no LAD CV:  RRR, no murmur LUNGS:  CTAB  EXTREM:  No edema NEURO:  No focal deficits  Repeat BP 185/88  A/P 61 y.o. AA female with  1.  Flu syndrome - tamiflu, dextromethrophan for cough, tylenol 2.   HTN - Advised to f/u with PCP tomorrow. Asymptomatic.   Napoleon Form, MD 05/02/2012 10:06 PM   Napoleon Form, MD 05/02/12 2206

## 2012-05-11 ENCOUNTER — Emergency Department (INDEPENDENT_AMBULATORY_CARE_PROVIDER_SITE_OTHER)
Admission: EM | Admit: 2012-05-11 | Discharge: 2012-05-11 | Disposition: A | Discharge status: Home / Self Care | Payer: Self-pay | Source: Home / Self Care | Attending: Emergency Medicine | Admitting: Emergency Medicine

## 2012-05-11 ENCOUNTER — Emergency Department (INDEPENDENT_AMBULATORY_CARE_PROVIDER_SITE_OTHER): Payer: BC Managed Care – PPO

## 2012-05-11 ENCOUNTER — Encounter (HOSPITAL_COMMUNITY): Payer: Self-pay | Admitting: Emergency Medicine

## 2012-05-11 DIAGNOSIS — J209 Acute bronchitis, unspecified: Secondary | ICD-10-CM

## 2012-05-11 DIAGNOSIS — J449 Chronic obstructive pulmonary disease, unspecified: Secondary | ICD-10-CM

## 2012-05-11 DIAGNOSIS — I7090 Unspecified atherosclerosis: Secondary | ICD-10-CM

## 2012-05-11 MED ORDER — PREDNISONE 5 MG PO KIT: 1.0000 | PACK | Freq: Every day | ORAL | Status: DC

## 2012-05-11 MED ORDER — ALBUTEROL SULFATE HFA 108 (90 BASE) MCG/ACT IN AERS: 1.0000 | INHALATION_SPRAY | Freq: Four times a day (QID) | RESPIRATORY_TRACT | Status: DC | PRN

## 2012-05-11 MED ORDER — BENZONATATE 200 MG PO CAPS: 200.0000 mg | ORAL_CAPSULE | Freq: Three times a day (TID) | ORAL | Status: DC | PRN

## 2012-05-11 MED ORDER — AMOXICILLIN 500 MG PO CAPS: 1000.0000 mg | ORAL_CAPSULE | Freq: Three times a day (TID) | ORAL | Status: DC

## 2012-05-11 NOTE — ED Provider Notes (Signed)
Chief Complaint  Patient presents with  . URI    History of Present Illness:   Morgan Torres is a 61 year old female who has had a ten-day history of a nonproductive cough and some soreness in her chest. Her chest feels tight and she's had some wheezing and rattling in her chest. She has felt tired and rundown I have much energy and not much of an appetite. She's felt hot and cold but no definite fever. She has had sore throat, rhinorrhea, and clear to green drainage. She also has some headache and sinus pressure. Her left ear is been aching and she has had alternating nausea and diarrhea. She was seen here week ago and diagnosed with influenza. She was given Tamiflu and dextromethorphan syrup, but does not feel much better. She continues to smoke about a pack of cigarettes a day although is trying to cut down. She has no history of COPD, chronic bronchitis, emphysema, or heart or vascular disease.  Review of Systems:  Other than noted above, the patient denies any of the following symptoms. Systemic:  No fever, chills, sweats, fatigue, myalgias, headache, or anorexia. Eye:  No redness, pain or drainage. ENT:  No earache, ear congestion, nasal congestion, sneezing, rhinorrhea, sinus pressure, sinus pain, post nasal drip, or sore throat. Lungs:  No cough, sputum production, wheezing, shortness of breath, or chest pain. GI:  No abdominal pain, nausea, vomiting, or diarrhea.  PMFSH:  Past medical history, family history, social history, meds, and allergies were reviewed.  Physical Exam:   Vital signs:  BP 145/87  Pulse 74  Temp 98.3 F (36.8 C) (Oral)  Resp 16  SpO2 96% General:  Alert, in no distress. Eye:  No conjunctival injection or drainage. Lids were normal. ENT:  TMs and canals were normal, without erythema or inflammation.  Nasal mucosa was clear and uncongested, without drainage.  Mucous membranes were moist.  Pharynx was clear, without exudate or drainage.  There were no oral ulcerations or  lesions. Neck:  Supple, no adenopathy, tenderness or mass. Lungs:  No respiratory distress.  Lungs showed scattered, bilateral wheezes and rhonchi, no rales.  Heart:  Regular rhythm, without gallops, murmers or rubs. Skin:  Clear, warm, and dry, without rash or lesions.  Radiology:  Dg Chest 2 View  05/11/2012  *RADIOLOGY REPORT*  Clinical Data: Productive cough.  Fever.  CHEST - 2 VIEW  Comparison: Chest x-ray 05/05/2010.  Findings: Lungs appear hyperexpanded with flattening of the hemidiaphragms, increased retrosternal air space and pruning of the pulmonary vasculature in the periphery, suggestive of underlying COPD.   No consolidative airspace disease.  No pleural effusions. No pneumothorax.  No pulmonary nodule or mass noted.  No pulmonary edema.  Cardiomediastinal silhouette is within normal limits. Atherosclerosis in the thoracic aorta.  IMPRESSION: 1. No radiographic evidence of acute cardiopulmonary disease. 2.  Atherosclerosis. 3.  Chronic changes of COPD redemonstrated.   Original Report Authenticated By: Trudie Reed, M.D.    I reviewed the images independently and personally and concur with the radiologist's findings.  Assessment:  The primary encounter diagnosis was Acute bronchitis. Diagnoses of COPD (chronic obstructive pulmonary disease) and Atherosclerosis were also pertinent to this visit.  Plan:   1.  The following meds were prescribed:   New Prescriptions   ALBUTEROL (PROVENTIL HFA;VENTOLIN HFA) 108 (90 BASE) MCG/ACT INHALER    Inhale 1-2 puffs into the lungs every 6 (six) hours as needed for wheezing.   AMOXICILLIN (AMOXIL) 500 MG CAPSULE    Take 2  capsules (1,000 mg total) by mouth 3 (three) times daily.   BENZONATATE (TESSALON) 200 MG CAPSULE    Take 1 capsule (200 mg total) by mouth 3 (three) times daily as needed for cough.   PREDNISONE 5 MG KIT    Take 1 kit (5 mg total) by mouth daily after breakfast. Prednisone 5 mg 6 day dosepack.  Take as directed.   2.  The  patient was instructed in symptomatic care and handouts were given. I informed the patient about the results of the chest x-ray particularly showing COPD and evidence of atherosclerosis. I suggested that she may want to followup with her primary care physician about both of these issues and she was encouraged to do everything she could to quit smoking. 3.  The patient was told to return if becoming worse in any way, if no better in 3 or 4 days, and given some red flag symptoms that would indicate earlier return.   Reuben Likes, MD 05/11/12 1351

## 2012-05-11 NOTE — ED Notes (Signed)
Patient quiet, reflective.  Patient was familiar with bronchitis, but really did not expect other diagnosis.  Encouraged follow up with her physician, stated she was going to.  Allowed patient time to vent on situation.

## 2012-05-11 NOTE — ED Notes (Addendum)
After being [facility] last [facility], noted cough to sound more "rattling" and had diarrhea.  Reports diarrhea for 3 days last weekend.  General soreness.  Cough is worse with lying down

## 2012-05-11 NOTE — ED Notes (Signed)
Patient transported to X-ray 

## 2012-06-25 DIAGNOSIS — I219 Acute myocardial infarction, unspecified: Secondary | ICD-10-CM

## 2012-06-25 HISTORY — DX: Acute myocardial infarction, unspecified: I21.9

## 2012-06-25 HISTORY — PX: CARDIAC CATHETERIZATION: SHX172

## 2012-09-10 ENCOUNTER — Emergency Department (HOSPITAL_COMMUNITY)
Admission: EM | Admit: 2012-09-10 | Discharge: 2012-09-10 | Disposition: A | Discharge status: Home / Self Care | Payer: Self-pay | Attending: Emergency Medicine | Admitting: Emergency Medicine

## 2012-09-10 ENCOUNTER — Encounter (HOSPITAL_COMMUNITY): Payer: Self-pay | Admitting: *Deleted

## 2012-09-10 DIAGNOSIS — G8929 Other chronic pain: Secondary | ICD-10-CM | POA: Insufficient documentation

## 2012-09-10 DIAGNOSIS — Z87891 Personal history of nicotine dependence: Secondary | ICD-10-CM | POA: Insufficient documentation

## 2012-09-10 DIAGNOSIS — Z8739 Personal history of other diseases of the musculoskeletal system and connective tissue: Secondary | ICD-10-CM | POA: Insufficient documentation

## 2012-09-10 DIAGNOSIS — Z9089 Acquired absence of other organs: Secondary | ICD-10-CM | POA: Insufficient documentation

## 2012-09-10 DIAGNOSIS — Z79899 Other long term (current) drug therapy: Secondary | ICD-10-CM | POA: Insufficient documentation

## 2012-09-10 DIAGNOSIS — M5431 Sciatica, right side: Secondary | ICD-10-CM

## 2012-09-10 DIAGNOSIS — I10 Essential (primary) hypertension: Secondary | ICD-10-CM | POA: Insufficient documentation

## 2012-09-10 DIAGNOSIS — M5430 Sciatica, unspecified side: Secondary | ICD-10-CM | POA: Insufficient documentation

## 2012-09-10 DIAGNOSIS — Z8719 Personal history of other diseases of the digestive system: Secondary | ICD-10-CM | POA: Insufficient documentation

## 2012-09-10 HISTORY — DX: Sciatica, unspecified side: M54.30

## 2012-09-10 HISTORY — DX: Unspecified intestinal obstruction, unspecified as to partial versus complete obstruction: K56.609

## 2012-09-10 HISTORY — DX: Essential (primary) hypertension: I10

## 2012-09-10 HISTORY — DX: Other intervertebral disc displacement, lumbar region: M51.26

## 2012-09-10 MED ORDER — PREDNISONE 10 MG PO TABS: 20.0000 mg | ORAL_TABLET | Freq: Every day | ORAL | Status: DC

## 2012-09-10 MED ORDER — TRAMADOL HCL 50 MG PO TABS
100.0000 mg | ORAL_TABLET | Freq: Once | ORAL | Status: AC
  Administered 2012-09-10: 100 mg via ORAL
  Filled 2012-09-10: qty 2

## 2012-09-10 MED ORDER — METHOCARBAMOL 500 MG PO TABS: 500.0000 mg | ORAL_TABLET | Freq: Two times a day (BID) | ORAL | Status: DC

## 2012-09-10 MED ORDER — TRAMADOL HCL 50 MG PO TABS: 50.0000 mg | ORAL_TABLET | Freq: Four times a day (QID) | ORAL | Status: DC | PRN

## 2012-09-10 NOTE — ED Notes (Signed)
PT with hx of herniated discs and sciatica for several months.  The pain is to the point where pt cannot find a comfortable positions and cannot sleep.  Denies changes and bowel or bladder habits.  States when she sits on toilet legs become numb.

## 2012-09-10 NOTE — ED Provider Notes (Signed)
History    This chart was scribed for non-physician practitioner working with Morgan Razor, MD by ED Scribe, Morgan Torres. This patient was seen in room TR06C/TR06C and the patient's care was started at 5:05 PM.   CSN: [id number]  Arrival date & time 09/10/12  1507   First MD Initiated Contact with Patient 09/10/12 1705      Chief Complaint  Patient presents with  . Back Pain    (Consider location/radiation/quality/duration/timing/severity/associated sxs/prior treatment) Patient is a 62 y.o. female presenting with back pain. The history is provided by the patient. No language interpreter was used.  Back Pain Associated symptoms: no chest pain, no dysuria, no fever and no headaches    Morgan Torres is a 62 y.o. female with h/o 3 herniated discs and sciatica who presents to the Emergency Department complaining of moderate constant chronic back pain onset 3 months ago. Pt states she has had condition for 5 years but that the pain is getting persistently worse on the right posterior side of her lower back the past two days. Pt states when she lies on her right side the pain radiates downward into her right foot and pain is a 9/10. When she sits on the toilet her legs go numb. Pt takes Meloxicam and Tramadol which has provided no relief. Pt denies bladder/bowel incontinence, fever, chills, cough, nausea, vomiting, diarrhea, SOB, weakness, and any other associated symptoms.  Pt saw Dr. Jordan Torres about 2 months ago but pt's insurance will not go through until May preventing her from scheduling another appointment. Dr. Jordan Torres diagnosed pt with herniated discs 5 years ago in which he suggested surgery but she was not ready for at the time.    Past Medical History  Diagnosis Date  . Lumbar herniated disc   . Sciatica   . Hypertension   . Small bowel obstruction     Past Surgical History  Procedure Laterality Date  . Abdominal surgery      resection of small intestine  . Back surgery    . Abdominal  hysterectomy      No family history on file.  History  Substance Use Topics  . Smoking status: Former Smoker -- 1.00 packs/day    Types: Cigarettes    Quit date: 08/22/2012  . Smokeless tobacco: Not on file  . Alcohol Use: Yes     Comment: occasionally    OB History   Grav Para Term Preterm Abortions TAB SAB Ect Mult Living                  Review of Systems  Constitutional: Negative for fever and chills.  HENT: Negative for sore throat and rhinorrhea.   Eyes: Negative for visual disturbance.  Respiratory: Negative for cough and shortness of breath.   Cardiovascular: Negative for chest pain.  Gastrointestinal: Negative for nausea, vomiting and diarrhea.  Genitourinary: Negative for dysuria and hematuria.  Musculoskeletal: Positive for myalgias and back pain.  Neurological: Negative for light-headedness and headaches.  Psychiatric/Behavioral: Negative for confusion.    Allergies  Codeine and Hydrocodone  Home Medications   Current Outpatient Rx  Name  Route  Sig  Dispense  Refill  . albuterol (PROVENTIL HFA;VENTOLIN HFA) 108 (90 BASE) MCG/ACT inhaler   Inhalation   Inhale 1-2 puffs into the lungs every 6 (six) hours as needed for wheezing.   1 Inhaler   0   . amoxicillin (AMOXIL) 500 MG capsule   Oral   Take 2 capsules (1,000 mg total) by  mouth 3 (three) times daily.   60 capsule   0     Dispense as written.   . benzonatate (TESSALON) 200 MG capsule   Oral   Take 1 capsule (200 mg total) by mouth 3 (three) times daily as needed for cough.   30 capsule   0   . dextromethorphan 15 MG/5ML syrup   Oral   Take 10 mLs (30 mg total) by mouth 4 (four) times daily as needed for cough.   120 mL   0   . oseltamivir (TAMIFLU) 75 MG capsule   Oral   Take 1 capsule (75 mg total) by mouth 2 (two) times daily.   10 capsule   0   . PredniSONE 5 MG KIT   Oral   Take 1 kit (5 mg total) by mouth daily after breakfast. Prednisone 5 mg 6 day dosepack.  Take as  directed.   1 kit   0     BP 151/83  Pulse 74  Temp(Src) 98.4 F (36.9 C) (Oral)  Resp 16  SpO2 98%  Physical Exam  Nursing note and vitals reviewed. Constitutional: She is oriented to person, place, and time. She appears well-developed and well-nourished. No distress.  HENT:  Head: Normocephalic and atraumatic.  Eyes: EOM are normal.  Neck: Neck supple. No tracheal deviation present.  Cardiovascular: Normal rate.   Pulmonary/Chest: Effort normal. No respiratory distress.  Musculoskeletal: Normal range of motion. She exhibits tenderness.  Genralized tenderness lumbar and para lumbar region without crepitus or step offs. She has increased pain with lumbar flexion and extension. Positive straight leg raise right greater than left.  Neurological: She is alert and oriented to person, place, and time.   Intact deep tendon reflex bilaterally. No foot drop. 5/5 foot strength bilaterally.   Skin: Skin is warm and dry.  Psychiatric: She has a normal mood and affect. Her behavior is normal.    ED Course  Procedures (including critical care time) DIAGNOSTIC STUDIES: Oxygen Saturation is 98% on room air, normal by my interpretation.    COORDINATION OF CARE:  5:26 PM Discussed ED treatment with pt and pt agrees. No red flags.  VSS, no evidence of infection.  No sxs concerning for UTI.  Pt will f/u closely with Dr. Lelon Torres as she may benefit from surgery if appropriate.      Labs Reviewed - No data to display No results found.   1. Sciatica, right       MDM  I personally performed the services described in this documentation, which was scribed in my presence. The recorded information has been reviewed and is accurate.          Morgan Helper, PA-C 09/10/12 Morgan Torres

## 2012-09-10 NOTE — ED Notes (Signed)
Pt c/o pain in lower back radiating down right leg x's 3 weeks.  Pt denies any injury st's she had 2 herniated disc in lower back

## 2012-09-11 NOTE — ED Provider Notes (Signed)
Medical screening examination/treatment/procedure(s) were performed by non-physician practitioner and as supervising physician I was immediately available for consultation/collaboration.  Chiffon Kittleson, MD 09/11/12 1428 

## 2012-11-26 ENCOUNTER — Encounter (HOSPITAL_COMMUNITY): Payer: Self-pay | Admitting: Emergency Medicine

## 2012-11-26 ENCOUNTER — Emergency Department (HOSPITAL_COMMUNITY)
Admission: EM | Admit: 2012-11-26 | Discharge: 2012-11-26 | Disposition: A | Discharge status: Home / Self Care | Payer: BC Managed Care – PPO | Attending: Emergency Medicine | Admitting: Emergency Medicine

## 2012-11-26 DIAGNOSIS — L509 Urticaria, unspecified: Secondary | ICD-10-CM

## 2012-11-26 DIAGNOSIS — Z87891 Personal history of nicotine dependence: Secondary | ICD-10-CM | POA: Insufficient documentation

## 2012-11-26 DIAGNOSIS — I10 Essential (primary) hypertension: Secondary | ICD-10-CM | POA: Insufficient documentation

## 2012-11-26 DIAGNOSIS — T50905A Adverse effect of unspecified drugs, medicaments and biological substances, initial encounter: Secondary | ICD-10-CM

## 2012-11-26 DIAGNOSIS — Z8739 Personal history of other diseases of the musculoskeletal system and connective tissue: Secondary | ICD-10-CM | POA: Insufficient documentation

## 2012-11-26 DIAGNOSIS — Z79899 Other long term (current) drug therapy: Secondary | ICD-10-CM | POA: Insufficient documentation

## 2012-11-26 DIAGNOSIS — L500 Allergic urticaria: Secondary | ICD-10-CM | POA: Insufficient documentation

## 2012-11-26 DIAGNOSIS — Z8719 Personal history of other diseases of the digestive system: Secondary | ICD-10-CM | POA: Insufficient documentation

## 2012-11-26 MED ORDER — EPINEPHRINE 0.3 MG/0.3ML IJ SOAJ: 0.3000 mg | Freq: Once | INTRAMUSCULAR | Status: AC | PRN

## 2012-11-26 MED ORDER — DEXAMETHASONE SODIUM PHOSPHATE 10 MG/ML IJ SOLN
10.0000 mg | Freq: Once | INTRAMUSCULAR | Status: AC
  Administered 2012-11-26: 10 mg via INTRAMUSCULAR
  Filled 2012-11-26: qty 1

## 2012-11-26 MED ORDER — PREDNISONE 20 MG PO TABS: 40.0000 mg | ORAL_TABLET | Freq: Every day | ORAL | Status: DC

## 2012-11-26 NOTE — ED Notes (Signed)
No diff breathing at this time

## 2012-11-26 NOTE — ED Provider Notes (Signed)
History     CSN: [id number]  Arrival date & time 11/26/12  1441   First MD Initiated Contact with Patient 11/26/12 1657      Chief Complaint  Patient presents with  . Urticaria    (Consider location/radiation/quality/duration/timing/severity/associated sxs/prior treatment) HPI  Patient is a 62 year old female past medical history significant for hypertension presenting to the emergency department for a pruritic rash that began around 2pm this afternoon after taking a new medication, Diflucan, an hour before. Pt states she has pruritic rash on her face, neck, trunk, back, arms, and legs. States she also had associated sensation that her throat was closing up. Pt states she was given 50mg  Benadryl by EMS with resolution of symptoms at this time. Pt denies any SOB, wheezing, CP, nausea, vomiting, lip or facial swelling.   Past Medical History  Diagnosis Date  . Lumbar herniated disc   . Sciatica   . Hypertension   . Small bowel obstruction     Past Surgical History  Procedure Laterality Date  . Abdominal surgery      resection of small intestine  . Back surgery    . Abdominal hysterectomy      No family history on file.  History  Substance Use Topics  . Smoking status: Former Smoker -- 1.00 packs/day    Types: Cigarettes    Quit date: 08/22/2012  . Smokeless tobacco: Not on file  . Alcohol Use: Yes     Comment: occasionally    OB History   Grav Para Term Preterm Abortions TAB SAB Ect Mult Living                  Review of Systems  Constitutional: Negative for fever and chills.  HENT: Negative for facial swelling and neck pain.   Eyes: Negative.   Respiratory: Negative for cough and chest tightness.   Cardiovascular: Negative for chest pain.  Gastrointestinal: Negative for nausea and vomiting.  Genitourinary: Negative.   Musculoskeletal: Negative.   Skin: Positive for rash.  Neurological: Negative.     Allergies  Codeine and Hydrocodone  Home  Medications   Current Outpatient Rx  Name  Route  Sig  Dispense  Refill  . albuterol (PROVENTIL HFA;VENTOLIN HFA) 108 (90 BASE) MCG/ACT inhaler   Inhalation   Inhale 1-2 puffs into the lungs every 6 (six) hours as needed for wheezing or shortness of breath.         . meloxicam (MOBIC) 15 MG tablet   Oral   Take 15 mg by mouth daily.         . methocarbamol (ROBAXIN) 500 MG tablet   Oral   Take 500 mg by mouth 2 (two) times daily as needed (pain).         Marland Kitchen olmesartan-hydrochlorothiazide (BENICAR HCT) 20-12.5 MG per tablet   Oral   Take 1 tablet by mouth daily.         . predniSONE (DELTASONE) 10 MG tablet   Oral   Take 2 tablets (20 mg total) by mouth daily.   15 tablet   0   . traMADol (ULTRAM) 50 MG tablet   Oral   Take 50 mg by mouth every 4 (four) hours as needed for pain.         Marland Kitchen EPINEPHrine (EPIPEN 2-PAK) 0.3 mg/0.3 mL DEVI   Intramuscular   Inject 0.3 mLs (0.3 mg total) into the muscle once as needed (for severe allergic reaction). CAll 911 immediately if you have to use  this medicine   1 Device   1   . predniSONE (DELTASONE) 20 MG tablet   Oral   Take 2 tablets (40 mg total) by mouth daily.   10 tablet   0     BP 135/51  Pulse 73  Temp(Src) 97.5 F (36.4 C) (Oral)  Resp 16  SpO2 95%  Physical Exam  Constitutional: She is oriented to person, place, and time. She appears well-developed and well-nourished.  HENT:  Head: Normocephalic and atraumatic.  Mouth/Throat: Uvula is midline, oropharynx is clear and moist and mucous membranes are normal. No edematous. No oropharyngeal exudate or posterior oropharyngeal edema.  Eyes: Conjunctivae are normal.  Neck: Trachea normal and full passive range of motion without pain.  Cardiovascular: Normal rate, regular rhythm and normal heart sounds.   Pulmonary/Chest: Effort normal and breath sounds normal. No stridor. No respiratory distress. She has no wheezes.  Abdominal: Soft. Bowel sounds are normal.   Neurological: She is alert and oriented to person, place, and time.  Skin: Skin is warm, dry and intact. No rash noted. She is not diaphoretic. No erythema. No pallor.  Psychiatric: She has a normal mood and affect.    ED Course  Procedures (including critical care time)  Labs Reviewed - No data to display No results found.   1. Adverse drug reaction, initial encounter   2. Urticaria       MDM  Pt has been monitored in ED for 4+ hours w/o recurrence of symptoms. Patient re-evaluated prior to dc, is hemodynamically stable, in no respiratory distress, and denies the feeling of throat closing. Pt has been advised to take OTC benadryl and prednisone, provided an epi-pen & return to the ED if they have a mod-severe allergic rxn (s/s including throat closing, difficulty breathing, swelling of lips face or tongue). Pt is to follow up with their PCP and discontinue diflucan. Pt is agreeable with plan & verbalizes understanding. Patient d/w with Dr. Rhunette Croft, agrees with plan. Patient is stable at time of discharge           Jeannetta Ellis, PA-C 11/26/12 1947

## 2012-11-26 NOTE — ED Notes (Signed)
States had just seen her dr recently for back pain and given meds and then started to have hives 45 moiins ago mostly on arms has allready taken 50 mg benadryl po prior to ems arrival

## 2012-11-27 NOTE — ED Provider Notes (Signed)
Medical screening examination/treatment/procedure(s) were performed by non-physician practitioner and as supervising physician I was immediately available for consultation/collaboration.  Kaimani Clayson, MD 11/27/12 2359 

## 2012-12-12 ENCOUNTER — Other Ambulatory Visit: Payer: Self-pay | Admitting: Neurosurgery

## 2012-12-12 DIAGNOSIS — M5416 Radiculopathy, lumbar region: Secondary | ICD-10-CM

## 2012-12-15 ENCOUNTER — Ambulatory Visit
Admission: RE | Admit: 2012-12-15 | Discharge: 2012-12-15 | Disposition: A | Discharge status: Home / Self Care | Payer: BC Managed Care – PPO | Source: Ambulatory Visit | Attending: Neurosurgery | Admitting: Neurosurgery

## 2012-12-15 DIAGNOSIS — M5416 Radiculopathy, lumbar region: Secondary | ICD-10-CM

## 2012-12-15 MED ORDER — GADOBENATE DIMEGLUMINE 529 MG/ML IV SOLN
19.0000 mL | Freq: Once | INTRAVENOUS | Status: AC | PRN
  Administered 2012-12-15: 19 mL via INTRAVENOUS

## 2012-12-25 ENCOUNTER — Other Ambulatory Visit: Payer: Self-pay | Admitting: Neurosurgery

## 2012-12-31 ENCOUNTER — Encounter (HOSPITAL_COMMUNITY): Payer: Self-pay | Admitting: Respiratory Therapy

## 2013-01-05 ENCOUNTER — Encounter (HOSPITAL_COMMUNITY): Payer: Self-pay

## 2013-01-05 ENCOUNTER — Encounter (HOSPITAL_COMMUNITY)
Admission: RE | Admit: 2013-01-05 | Discharge: 2013-01-05 | Disposition: A | Discharge status: Home / Self Care | Payer: BC Managed Care – PPO | Source: Ambulatory Visit | Attending: Neurosurgery | Admitting: Neurosurgery

## 2013-01-05 DIAGNOSIS — Z01812 Encounter for preprocedural laboratory examination: Secondary | ICD-10-CM | POA: Insufficient documentation

## 2013-01-05 DIAGNOSIS — Z01818 Encounter for other preprocedural examination: Secondary | ICD-10-CM | POA: Insufficient documentation

## 2013-01-05 DIAGNOSIS — Z01810 Encounter for preprocedural cardiovascular examination: Secondary | ICD-10-CM | POA: Insufficient documentation

## 2013-01-05 HISTORY — DX: Chronic obstructive pulmonary disease, unspecified: J44.9

## 2013-01-05 HISTORY — DX: Unspecified osteoarthritis, unspecified site: M19.90

## 2013-01-05 LAB — CBC WITH DIFFERENTIAL/PLATELET
Basophils Absolute: 0 10*3/uL (ref 0.0–0.1)
Eosinophils Relative: 1 % (ref 0–5)
HCT: 42.8 % (ref 36.0–46.0)
Lymphocytes Relative: 43 % (ref 12–46)
Lymphs Abs: 3.5 10*3/uL (ref 0.7–4.0)
MCV: 92.8 fL (ref 78.0–100.0)
Monocytes Absolute: 0.7 10*3/uL (ref 0.1–1.0)
Neutro Abs: 3.9 10*3/uL (ref 1.7–7.7)
RBC: 4.61 MIL/uL (ref 3.87–5.11)
RDW: 13.4 % (ref 11.5–15.5)
WBC: 8.2 10*3/uL (ref 4.0–10.5)

## 2013-01-05 LAB — BASIC METABOLIC PANEL
CO2: 31 mEq/L (ref 19–32)
Chloride: 98 mEq/L (ref 96–112)
Creatinine, Ser: 0.9 mg/dL (ref 0.50–1.10)
GFR calc Af Amer: 77 mL/min — ABNORMAL LOW (ref >90)
Potassium: 4.3 mEq/L (ref 3.5–5.1)
Sodium: 137 mEq/L (ref 135–145)

## 2013-01-05 LAB — TYPE AND SCREEN: ABO/RH(D): B NEG

## 2013-01-05 LAB — SURGICAL PCR SCREEN
MRSA, PCR: NEGATIVE
Staphylococcus aureus: NEGATIVE

## 2013-01-05 NOTE — Pre-Procedure Instructions (Signed)
Morgan Torres  01/05/2013   Your procedure is scheduled on:  Friday January 09, 2013  Report to [facility] at 0930 AM.  Call this number if you have problems the morning of surgery: [phone]   Remember:   Do not eat food or drink liquids after midnight.   Take these medicines the morning of surgery with A SIP OF WATER: Gabapentin, Lorazepam, and Tramadol. Bring inhaler   Do not wear jewelry, make-up or nail polish.  Do not wear lotions, powders, or perfumes. You may wear deodorant.  Do not shave 48 hours prior to surgery.   Do not bring valuables to the hospital.  [facility] is not responsible for any belongings or valuables.  Contacts, dentures or bridgework may not be worn into surgery.  Leave suitcase in the car. After surgery it may be brought to your room.  For patients admitted to the hospital, checkout time is 11:00 AM the day of  discharge.   Patients discharged the day of surgery will not be allowed to drive home.    Special Instructions: Shower using CHG 2 nights before surgery and the night before surgery.  If you shower the day of surgery use CHG.  Use special wash - you have one bottle of CHG for all showers.  You should use approximately 1/3 of the bottle for each shower.   Please read over the following fact sheets that you were given: Pain Booklet, Coughing and Deep Breathing, Blood Transfusion Information, MRSA Information and Surgical Site Infection Prevention

## 2013-01-08 MED ORDER — CEFAZOLIN SODIUM-DEXTROSE 2-3 GM-% IV SOLR: 2.0000 g | INTRAVENOUS | Status: DC

## 2013-01-08 NOTE — Progress Notes (Signed)
Pt notified of time change;to arrive at 11:45

## 2013-01-09 ENCOUNTER — Encounter (HOSPITAL_COMMUNITY): Admission: RE | Payer: Self-pay | Source: Ambulatory Visit

## 2013-01-09 ENCOUNTER — Ambulatory Visit (HOSPITAL_COMMUNITY): Admission: RE | Admit: 2013-01-09 | Payer: BC Managed Care – PPO | Source: Ambulatory Visit | Admitting: Neurosurgery

## 2013-01-09 SURGERY — POSTERIOR LUMBAR FUSION 1 LEVEL
Anesthesia: General | Site: Back

## 2013-01-14 ENCOUNTER — Encounter (HOSPITAL_COMMUNITY): Payer: Self-pay | Admitting: Pharmacy Technician

## 2013-01-22 ENCOUNTER — Encounter (HOSPITAL_COMMUNITY): Payer: Self-pay | Admitting: *Deleted

## 2013-01-22 NOTE — Progress Notes (Signed)
Pt denies SOB, chest pain,  and being under the care of a cardiologist. Pt denies having a cardiac cath and echo. Pt made aware to stop taking Aspirin and herbal medications. Do not take any NSAIDs ie: Ibuprofen, Advil, Naproxen or any medication containing Aspirin. Pt advised to bring in albuterol inhaler on DOS.

## 2013-01-23 ENCOUNTER — Inpatient Hospital Stay (HOSPITAL_COMMUNITY)
Admission: RE | Admit: 2013-01-23 | Discharge: 2013-01-28 | DRG: 755 | Disposition: A | Discharge status: Home Health | Payer: BC Managed Care – PPO | Source: Ambulatory Visit | Attending: Neurosurgery | Admitting: Neurosurgery

## 2013-01-23 ENCOUNTER — Ambulatory Visit (HOSPITAL_COMMUNITY): Payer: BC Managed Care – PPO

## 2013-01-23 ENCOUNTER — Encounter (HOSPITAL_COMMUNITY): Payer: Self-pay | Admitting: Anesthesiology

## 2013-01-23 ENCOUNTER — Encounter (HOSPITAL_COMMUNITY)
Admission: RE | Disposition: A | Discharge status: Home Health | Payer: Self-pay | Source: Ambulatory Visit | Attending: Neurosurgery

## 2013-01-23 ENCOUNTER — Encounter (HOSPITAL_COMMUNITY): Payer: Self-pay | Admitting: *Deleted

## 2013-01-23 ENCOUNTER — Ambulatory Visit (HOSPITAL_COMMUNITY): Payer: BC Managed Care – PPO | Admitting: Anesthesiology

## 2013-01-23 DIAGNOSIS — M47817 Spondylosis without myelopathy or radiculopathy, lumbosacral region: Principal | ICD-10-CM | POA: Diagnosis present

## 2013-01-23 DIAGNOSIS — M48062 Spinal stenosis, lumbar region with neurogenic claudication: Secondary | ICD-10-CM | POA: Diagnosis present

## 2013-01-23 DIAGNOSIS — J4489 Other specified chronic obstructive pulmonary disease: Secondary | ICD-10-CM | POA: Diagnosis present

## 2013-01-23 DIAGNOSIS — F411 Generalized anxiety disorder: Secondary | ICD-10-CM | POA: Diagnosis present

## 2013-01-23 DIAGNOSIS — Z79899 Other long term (current) drug therapy: Secondary | ICD-10-CM

## 2013-01-23 DIAGNOSIS — M5126 Other intervertebral disc displacement, lumbar region: Secondary | ICD-10-CM | POA: Diagnosis present

## 2013-01-23 DIAGNOSIS — G9741 Accidental puncture or laceration of dura during a procedure: Secondary | ICD-10-CM | POA: Diagnosis not present

## 2013-01-23 DIAGNOSIS — F17200 Nicotine dependence, unspecified, uncomplicated: Secondary | ICD-10-CM | POA: Diagnosis present

## 2013-01-23 DIAGNOSIS — Z8249 Family history of ischemic heart disease and other diseases of the circulatory system: Secondary | ICD-10-CM

## 2013-01-23 DIAGNOSIS — F329 Major depressive disorder, single episode, unspecified: Secondary | ICD-10-CM | POA: Diagnosis present

## 2013-01-23 DIAGNOSIS — IMO0002 Reserved for concepts with insufficient information to code with codable children: Secondary | ICD-10-CM | POA: Diagnosis not present

## 2013-01-23 DIAGNOSIS — R51 Headache: Secondary | ICD-10-CM | POA: Diagnosis not present

## 2013-01-23 DIAGNOSIS — Z833 Family history of diabetes mellitus: Secondary | ICD-10-CM

## 2013-01-23 DIAGNOSIS — I10 Essential (primary) hypertension: Secondary | ICD-10-CM | POA: Diagnosis present

## 2013-01-23 DIAGNOSIS — IMO0001 Reserved for inherently not codable concepts without codable children: Secondary | ICD-10-CM | POA: Diagnosis present

## 2013-01-23 DIAGNOSIS — F3289 Other specified depressive episodes: Secondary | ICD-10-CM | POA: Diagnosis present

## 2013-01-23 DIAGNOSIS — J449 Chronic obstructive pulmonary disease, unspecified: Secondary | ICD-10-CM | POA: Diagnosis present

## 2013-01-23 HISTORY — DX: Bronchitis, not specified as acute or chronic: J40

## 2013-01-23 HISTORY — DX: Depression, unspecified: F32.A

## 2013-01-23 HISTORY — DX: Pneumonia, unspecified organism: J18.9

## 2013-01-23 HISTORY — DX: Fibromyalgia: M79.7

## 2013-01-23 HISTORY — DX: Anxiety disorder, unspecified: F41.9

## 2013-01-23 HISTORY — DX: Major depressive disorder, single episode, unspecified: F32.9

## 2013-01-23 LAB — CBC
HCT: 44.5 % (ref 36.0–46.0)
MCHC: 35.3 g/dL (ref 30.0–36.0)
MCV: 92.9 fL (ref 78.0–100.0)
Platelets: 244 10*3/uL (ref 150–400)
RDW: 13.5 % (ref 11.5–15.5)

## 2013-01-23 LAB — TYPE AND SCREEN: Antibody Screen: NEGATIVE

## 2013-01-23 LAB — BASIC METABOLIC PANEL
BUN: 11 mg/dL (ref 6–23)
CO2: 30 mEq/L (ref 19–32)
Calcium: 9.5 mg/dL (ref 8.4–10.5)
Chloride: 102 mEq/L (ref 96–112)
Creatinine, Ser: 0.84 mg/dL (ref 0.50–1.10)

## 2013-01-23 SURGERY — POSTERIOR LUMBAR FUSION 1 LEVEL
Anesthesia: General | Site: Spine Lumbar | Wound class: Clean

## 2013-01-23 MED ORDER — ACETAMINOPHEN 325 MG PO TABS
650.0000 mg | ORAL_TABLET | ORAL | Status: DC | PRN
  Administered 2013-01-26 – 2013-01-27 (×2): 650 mg via ORAL
  Filled 2013-01-23 (×2): qty 2

## 2013-01-23 MED ORDER — LORAZEPAM 1 MG PO TABS
1.0000 mg | ORAL_TABLET | Freq: Every day | ORAL | Status: DC | PRN
  Administered 2013-01-24: 1 mg via ORAL
  Filled 2013-01-23: qty 1

## 2013-01-23 MED ORDER — GLYCOPYRROLATE 0.2 MG/ML IJ SOLN
INTRAMUSCULAR | Status: DC | PRN
  Administered 2013-01-23: .8 mg via INTRAVENOUS

## 2013-01-23 MED ORDER — PHENOL 1.4 % MT LIQD: 1.0000 | OROMUCOSAL | Status: DC | PRN

## 2013-01-23 MED ORDER — ARTIFICIAL TEARS OP OINT
TOPICAL_OINTMENT | OPHTHALMIC | Status: DC | PRN
  Administered 2013-01-23: 1 via OPHTHALMIC

## 2013-01-23 MED ORDER — HYDROMORPHONE HCL PF 1 MG/ML IJ SOLN
INTRAMUSCULAR | Status: AC
  Filled 2013-01-23: qty 1

## 2013-01-23 MED ORDER — SODIUM CHLORIDE 0.9 % IR SOLN
Status: DC | PRN
  Administered 2013-01-23: 14:00:00

## 2013-01-23 MED ORDER — FENTANYL CITRATE 0.05 MG/ML IJ SOLN
INTRAMUSCULAR | Status: DC | PRN
  Administered 2013-01-23: 100 ug via INTRAVENOUS
  Administered 2013-01-23 (×2): 50 ug via INTRAVENOUS
  Administered 2013-01-23 (×2): 100 ug via INTRAVENOUS
  Administered 2013-01-23: 150 ug via INTRAVENOUS
  Administered 2013-01-23 (×2): 100 ug via INTRAVENOUS

## 2013-01-23 MED ORDER — BUPIVACAINE HCL (PF) 0.25 % IJ SOLN
INTRAMUSCULAR | Status: DC | PRN
  Administered 2013-01-23: 20 mL

## 2013-01-23 MED ORDER — PROPOFOL 10 MG/ML IV BOLUS
INTRAVENOUS | Status: DC | PRN
  Administered 2013-01-23: 150 mg via INTRAVENOUS

## 2013-01-23 MED ORDER — SODIUM CHLORIDE 0.9 % IV SOLN
INTRAVENOUS | Status: AC
  Filled 2013-01-23: qty 500

## 2013-01-23 MED ORDER — OXYCODONE-ACETAMINOPHEN 5-325 MG PO TABS
1.0000 | ORAL_TABLET | ORAL | Status: DC | PRN
  Administered 2013-01-23 – 2013-01-27 (×9): 2 via ORAL
  Administered 2013-01-27: 1 via ORAL
  Administered 2013-01-27 – 2013-01-28 (×5): 2 via ORAL
  Filled 2013-01-23: qty 2
  Filled 2013-01-23: qty 1
  Filled 2013-01-23 (×12): qty 2

## 2013-01-23 MED ORDER — OXYCODONE-ACETAMINOPHEN 5-325 MG PO TABS
ORAL_TABLET | ORAL | Status: AC
  Filled 2013-01-23: qty 2

## 2013-01-23 MED ORDER — ROCURONIUM BROMIDE 100 MG/10ML IV SOLN
INTRAVENOUS | Status: DC | PRN
  Administered 2013-01-23 (×3): 10 mg via INTRAVENOUS
  Administered 2013-01-23: 50 mg via INTRAVENOUS
  Administered 2013-01-23: 10 mg via INTRAVENOUS

## 2013-01-23 MED ORDER — LOSARTAN POTASSIUM 50 MG PO TABS
100.0000 mg | ORAL_TABLET | Freq: Every day | ORAL | Status: DC
  Administered 2013-01-23 – 2013-01-28 (×5): 100 mg via ORAL
  Filled 2013-01-23 (×6): qty 2

## 2013-01-23 MED ORDER — EPHEDRINE SULFATE 50 MG/ML IJ SOLN
INTRAMUSCULAR | Status: DC | PRN
  Administered 2013-01-23 (×2): 5 mg via INTRAVENOUS
  Administered 2013-01-23: 10 mg via INTRAVENOUS

## 2013-01-23 MED ORDER — GABAPENTIN 100 MG PO CAPS
100.0000 mg | ORAL_CAPSULE | Freq: Every day | ORAL | Status: DC
  Administered 2013-01-23 – 2013-01-24 (×2): 100 mg via ORAL
  Filled 2013-01-23 (×3): qty 1

## 2013-01-23 MED ORDER — NEOSTIGMINE METHYLSULFATE 1 MG/ML IJ SOLN
INTRAMUSCULAR | Status: DC | PRN
  Administered 2013-01-23: 5 mg via INTRAVENOUS

## 2013-01-23 MED ORDER — DIAZEPAM 5 MG PO TABS
5.0000 mg | ORAL_TABLET | Freq: Four times a day (QID) | ORAL | Status: DC | PRN
  Administered 2013-01-23: 5 mg via ORAL
  Administered 2013-01-25 (×2): 10 mg via ORAL
  Administered 2013-01-26: 5 mg via ORAL
  Administered 2013-01-26 (×2): 10 mg via ORAL
  Administered 2013-01-27 – 2013-01-28 (×2): 5 mg via ORAL
  Filled 2013-01-23: qty 2
  Filled 2013-01-23 (×2): qty 1
  Filled 2013-01-23 (×3): qty 2
  Filled 2013-01-23: qty 1

## 2013-01-23 MED ORDER — SODIUM CHLORIDE 0.9 % IJ SOLN
3.0000 mL | Freq: Two times a day (BID) | INTRAMUSCULAR | Status: DC
  Administered 2013-01-23 – 2013-01-28 (×10): 3 mL via INTRAVENOUS

## 2013-01-23 MED ORDER — ALBUTEROL SULFATE HFA 108 (90 BASE) MCG/ACT IN AERS: 1.0000 | INHALATION_SPRAY | Freq: Four times a day (QID) | RESPIRATORY_TRACT | Status: DC | PRN

## 2013-01-23 MED ORDER — ALBUMIN HUMAN 5 % IV SOLN
INTRAVENOUS | Status: DC | PRN
  Administered 2013-01-23: 14:00:00 via INTRAVENOUS

## 2013-01-23 MED ORDER — MIDAZOLAM HCL 2 MG/2ML IJ SOLN: 0.5000 mg | Freq: Once | INTRAMUSCULAR | Status: DC | PRN

## 2013-01-23 MED ORDER — OXYCODONE HCL 5 MG/5ML PO SOLN: 5.0000 mg | Freq: Once | ORAL | Status: DC | PRN

## 2013-01-23 MED ORDER — BACITRACIN 50000 UNITS IM SOLR
INTRAMUSCULAR | Status: AC
  Filled 2013-01-23: qty 1

## 2013-01-23 MED ORDER — SODIUM CHLORIDE 0.9 % IJ SOLN: 3.0000 mL | INTRAMUSCULAR | Status: DC | PRN

## 2013-01-23 MED ORDER — MIDAZOLAM HCL 5 MG/5ML IJ SOLN
INTRAMUSCULAR | Status: DC | PRN
  Administered 2013-01-23: 2 mg via INTRAVENOUS

## 2013-01-23 MED ORDER — HYDROCHLOROTHIAZIDE 12.5 MG PO CAPS
12.5000 mg | ORAL_CAPSULE | Freq: Every day | ORAL | Status: DC
  Administered 2013-01-23 – 2013-01-28 (×6): 12.5 mg via ORAL
  Filled 2013-01-23 (×6): qty 1

## 2013-01-23 MED ORDER — 0.9 % SODIUM CHLORIDE (POUR BTL) OPTIME
TOPICAL | Status: DC | PRN
  Administered 2013-01-23: 1000 mL

## 2013-01-23 MED ORDER — DEXAMETHASONE SODIUM PHOSPHATE 10 MG/ML IJ SOLN: 10.0000 mg | INTRAMUSCULAR | Status: DC

## 2013-01-23 MED ORDER — OXYCODONE HCL 5 MG PO TABS: 5.0000 mg | ORAL_TABLET | Freq: Once | ORAL | Status: DC | PRN

## 2013-01-23 MED ORDER — ONDANSETRON HCL 4 MG/2ML IJ SOLN
INTRAMUSCULAR | Status: DC | PRN
  Administered 2013-01-23: 4 mg via INTRAVENOUS

## 2013-01-23 MED ORDER — DEXAMETHASONE SODIUM PHOSPHATE 10 MG/ML IJ SOLN
INTRAMUSCULAR | Status: AC
  Filled 2013-01-23: qty 1

## 2013-01-23 MED ORDER — SENNA 8.6 MG PO TABS
1.0000 | ORAL_TABLET | Freq: Two times a day (BID) | ORAL | Status: DC
  Administered 2013-01-23 – 2013-01-28 (×10): 8.6 mg via ORAL
  Filled 2013-01-23 (×15): qty 1

## 2013-01-23 MED ORDER — PHENYLEPHRINE HCL 10 MG/ML IJ SOLN
10.0000 mg | INTRAVENOUS | Status: DC | PRN
  Administered 2013-01-23: 10 ug/min via INTRAVENOUS

## 2013-01-23 MED ORDER — LACTATED RINGERS IV SOLN
INTRAVENOUS | Status: DC | PRN
  Administered 2013-01-23 (×4): via INTRAVENOUS

## 2013-01-23 MED ORDER — ALUM & MAG HYDROXIDE-SIMETH 200-200-20 MG/5ML PO SUSP: 30.0000 mL | Freq: Four times a day (QID) | ORAL | Status: DC | PRN

## 2013-01-23 MED ORDER — LIDOCAINE HCL (CARDIAC) 20 MG/ML IV SOLN
INTRAVENOUS | Status: DC | PRN
  Administered 2013-01-23: 30 mg via INTRAVENOUS

## 2013-01-23 MED ORDER — ACETAMINOPHEN 650 MG RE SUPP: 650.0000 mg | RECTAL | Status: DC | PRN

## 2013-01-23 MED ORDER — LACTATED RINGERS IV SOLN
INTRAVENOUS | Status: DC
  Administered 2013-01-23: 11:00:00 via INTRAVENOUS

## 2013-01-23 MED ORDER — DEXAMETHASONE SODIUM PHOSPHATE 10 MG/ML IJ SOLN
INTRAMUSCULAR | Status: DC | PRN
  Administered 2013-01-23: 10 mg via INTRAVENOUS

## 2013-01-23 MED ORDER — PROMETHAZINE HCL 25 MG/ML IJ SOLN: 6.2500 mg | INTRAMUSCULAR | Status: DC | PRN

## 2013-01-23 MED ORDER — LOSARTAN POTASSIUM-HCTZ 100-12.5 MG PO TABS: 1.0000 | ORAL_TABLET | Freq: Every day | ORAL | Status: DC

## 2013-01-23 MED ORDER — DIAZEPAM 5 MG PO TABS
ORAL_TABLET | ORAL | Status: AC
  Filled 2013-01-23: qty 1

## 2013-01-23 MED ORDER — HYDROMORPHONE HCL PF 1 MG/ML IJ SOLN
0.5000 mg | INTRAMUSCULAR | Status: DC | PRN
  Administered 2013-01-23: 0.5 mg via INTRAVENOUS
  Administered 2013-01-23 – 2013-01-26 (×4): 1 mg via INTRAVENOUS
  Filled 2013-01-23 (×5): qty 1

## 2013-01-23 MED ORDER — MEPERIDINE HCL 25 MG/ML IJ SOLN: 6.2500 mg | INTRAMUSCULAR | Status: DC | PRN

## 2013-01-23 MED ORDER — HYDROMORPHONE HCL PF 1 MG/ML IJ SOLN
0.2500 mg | INTRAMUSCULAR | Status: DC | PRN
  Administered 2013-01-23 (×2): 0.5 mg via INTRAVENOUS

## 2013-01-23 MED ORDER — OXYCODONE-ACETAMINOPHEN 10-325 MG PO TABS: 1.0000 | ORAL_TABLET | ORAL | Status: DC | PRN

## 2013-01-23 MED ORDER — BISACODYL 10 MG RE SUPP
10.0000 mg | Freq: Every day | RECTAL | Status: DC | PRN
  Administered 2013-01-26: 10 mg via RECTAL
  Filled 2013-01-23: qty 1

## 2013-01-23 MED ORDER — FLEET ENEMA 7-19 GM/118ML RE ENEM: 1.0000 | ENEMA | Freq: Once | RECTAL | Status: AC | PRN

## 2013-01-23 MED ORDER — SODIUM CHLORIDE 0.9 % IV SOLN: 250.0000 mL | INTRAVENOUS | Status: DC

## 2013-01-23 MED ORDER — POLYETHYLENE GLYCOL 3350 17 G PO PACK
17.0000 g | PACK | Freq: Every day | ORAL | Status: DC | PRN
  Filled 2013-01-23: qty 1

## 2013-01-23 MED ORDER — PHENYLEPHRINE HCL 10 MG/ML IJ SOLN
INTRAMUSCULAR | Status: DC | PRN
  Administered 2013-01-23 (×5): 40 ug via INTRAVENOUS
  Administered 2013-01-23: 80 ug via INTRAVENOUS
  Administered 2013-01-23 (×2): 40 ug via INTRAVENOUS

## 2013-01-23 MED ORDER — MENTHOL 3 MG MT LOZG
1.0000 | LOZENGE | OROMUCOSAL | Status: DC | PRN
  Administered 2013-01-27: 3 mg via ORAL
  Filled 2013-01-23: qty 9

## 2013-01-23 MED ORDER — CEFAZOLIN SODIUM 1-5 GM-% IV SOLN
1.0000 g | Freq: Three times a day (TID) | INTRAVENOUS | Status: AC
  Administered 2013-01-23 – 2013-01-24 (×2): 1 g via INTRAVENOUS
  Filled 2013-01-23 (×2): qty 50

## 2013-01-23 MED ORDER — EPINEPHRINE 0.3 MG/0.3ML IJ SOAJ
0.3000 mg | Freq: Once | INTRAMUSCULAR | Status: AC | PRN
  Filled 2013-01-23: qty 0.6

## 2013-01-23 MED ORDER — ONDANSETRON HCL 4 MG/2ML IJ SOLN
4.0000 mg | INTRAMUSCULAR | Status: DC | PRN
  Administered 2013-01-25: 4 mg via INTRAVENOUS
  Filled 2013-01-23: qty 2

## 2013-01-23 MED ORDER — THROMBIN 20000 UNITS EX SOLR
CUTANEOUS | Status: DC | PRN
  Administered 2013-01-23: 14:00:00 via TOPICAL

## 2013-01-23 MED ORDER — CEFAZOLIN SODIUM-DEXTROSE 2-3 GM-% IV SOLR
INTRAVENOUS | Status: AC
  Administered 2013-01-23: 2 g via INTRAVENOUS
  Filled 2013-01-23: qty 50

## 2013-01-23 SURGICAL SUPPLY — 76 items
BAG DECANTER FOR FLEXI CONT (MISCELLANEOUS) ×2 IMPLANT
BENZOIN TINCTURE PRP APPL 2/3 (GAUZE/BANDAGES/DRESSINGS) ×2 IMPLANT
BLADE SURG ROTATE 9660 (MISCELLANEOUS) IMPLANT
BRUSH SCRUB EZ PLAIN DRY (MISCELLANEOUS) ×2 IMPLANT
BUR CUTTER 7.0 ROUND (BURR) ×2 IMPLANT
BUR MATCHSTICK NEURO 3.0 LAGG (BURR) ×2 IMPLANT
CAGE 10X22 (Cage) ×2 IMPLANT
CANISTER SUCTION 2500CC (MISCELLANEOUS) ×2 IMPLANT
CAP LCK SPNE (Orthopedic Implant) ×4 IMPLANT
CAP LOCK SPINE RADIUS (Orthopedic Implant) ×4 IMPLANT
CAP LOCKING (Orthopedic Implant) ×4 IMPLANT
CLOTH BEACON ORANGE TIMEOUT ST (SAFETY) ×2 IMPLANT
CONT SPEC 4OZ CLIKSEAL STRL BL (MISCELLANEOUS) ×4 IMPLANT
COVER BACK TABLE 24X17X13 BIG (DRAPES) IMPLANT
COVER TABLE BACK 60X90 (DRAPES) ×2 IMPLANT
DECANTER SPIKE VIAL GLASS SM (MISCELLANEOUS) ×2 IMPLANT
DERMABOND ADHESIVE PROPEN (GAUZE/BANDAGES/DRESSINGS) ×1
DERMABOND ADVANCED (GAUZE/BANDAGES/DRESSINGS)
DERMABOND ADVANCED .7 DNX12 (GAUZE/BANDAGES/DRESSINGS) IMPLANT
DERMABOND ADVANCED .7 DNX6 (GAUZE/BANDAGES/DRESSINGS) ×1 IMPLANT
DRAPE C-ARM 42X72 X-RAY (DRAPES) ×4 IMPLANT
DRAPE LAPAROTOMY 100X72X124 (DRAPES) ×2 IMPLANT
DRAPE POUCH INSTRU U-SHP 10X18 (DRAPES) ×2 IMPLANT
DRAPE PROXIMA HALF (DRAPES) IMPLANT
DRAPE SURG 17X23 STRL (DRAPES) ×8 IMPLANT
DURAPREP 26ML APPLICATOR (WOUND CARE) ×2 IMPLANT
DURASEAL APPLICATOR TIP (TIP) ×2 IMPLANT
DURASEAL SPINE SEALANT 3ML (MISCELLANEOUS) ×2 IMPLANT
ELECT REM PT RETURN 9FT ADLT (ELECTROSURGICAL) ×2
ELECTRODE REM PT RTRN 9FT ADLT (ELECTROSURGICAL) ×1 IMPLANT
EVACUATOR 1/8 PVC DRAIN (DRAIN) ×2 IMPLANT
GAUZE SPONGE 4X4 16PLY XRAY LF (GAUZE/BANDAGES/DRESSINGS) IMPLANT
GLOVE BIO SURGEON STRL SZ 6.5 (GLOVE) ×4 IMPLANT
GLOVE BIO SURGEON STRL SZ8.5 (GLOVE) ×2 IMPLANT
GLOVE BIOGEL PI IND STRL 6.5 (GLOVE) ×1 IMPLANT
GLOVE BIOGEL PI IND STRL 7.0 (GLOVE) ×3 IMPLANT
GLOVE BIOGEL PI INDICATOR 6.5 (GLOVE) ×1
GLOVE BIOGEL PI INDICATOR 7.0 (GLOVE) ×3
GLOVE ECLIPSE 8.5 STRL (GLOVE) ×4 IMPLANT
GLOVE EXAM NITRILE LRG STRL (GLOVE) IMPLANT
GLOVE EXAM NITRILE MD LF STRL (GLOVE) IMPLANT
GLOVE EXAM NITRILE XL STR (GLOVE) IMPLANT
GLOVE EXAM NITRILE XS STR PU (GLOVE) IMPLANT
GLOVE SS BIOGEL STRL SZ 6.5 (GLOVE) ×2 IMPLANT
GLOVE SS BIOGEL STRL SZ 8 (GLOVE) ×1 IMPLANT
GLOVE SUPERSENSE BIOGEL SZ 6.5 (GLOVE) ×2
GLOVE SUPERSENSE BIOGEL SZ 8 (GLOVE) ×1
GOWN BRE IMP SLV AUR LG STRL (GOWN DISPOSABLE) IMPLANT
GOWN BRE IMP SLV AUR XL STRL (GOWN DISPOSABLE) IMPLANT
GOWN STRL REIN 2XL LVL4 (GOWN DISPOSABLE) IMPLANT
KIT BASIN OR (CUSTOM PROCEDURE TRAY) ×2 IMPLANT
KIT ROOM TURNOVER OR (KITS) ×2 IMPLANT
MILL MEDIUM DISP (BLADE) ×2 IMPLANT
NEEDLE HYPO 22GX1.5 SAFETY (NEEDLE) ×2 IMPLANT
NS IRRIG 1000ML POUR BTL (IV SOLUTION) ×2 IMPLANT
PACK LAMINECTOMY NEURO (CUSTOM PROCEDURE TRAY) ×2 IMPLANT
ROD RADIUS 35MM (Rod) ×2 IMPLANT
ROD RADIUS 40MM (Neuro Prosthesis/Implant) ×1 IMPLANT
ROD SPNL 40X5.5XNS TI RDS (Neuro Prosthesis/Implant) ×1 IMPLANT
SCREW 5.75X45MM (Screw) ×4 IMPLANT
SCREW 6.75X40MM (Screw) ×4 IMPLANT
SPONGE GAUZE 4X4 12PLY (GAUZE/BANDAGES/DRESSINGS) ×2 IMPLANT
SPONGE SURGIFOAM ABS GEL 100 (HEMOSTASIS) ×2 IMPLANT
STRIP CLOSURE SKIN 1/2X4 (GAUZE/BANDAGES/DRESSINGS) ×2 IMPLANT
SUT PROLENE 5 0 C1 (SUTURE) ×2 IMPLANT
SUT VIC AB 0 CT1 18XCR BRD8 (SUTURE) ×2 IMPLANT
SUT VIC AB 0 CT1 8-18 (SUTURE) ×2
SUT VIC AB 2-0 CT1 18 (SUTURE) ×2 IMPLANT
SUT VIC AB 3-0 SH 8-18 (SUTURE) ×2 IMPLANT
SYR 20ML ECCENTRIC (SYRINGE) ×2 IMPLANT
TAPE CLOTH SURG 4X10 WHT LF (GAUZE/BANDAGES/DRESSINGS) ×2 IMPLANT
TOWEL OR 17X24 6PK STRL BLUE (TOWEL DISPOSABLE) ×2 IMPLANT
TOWEL OR 17X26 10 PK STRL BLUE (TOWEL DISPOSABLE) ×2 IMPLANT
TRAY FOLEY CATH 14FRSI W/METER (CATHETERS) ×2 IMPLANT
WATER STERILE IRR 1000ML POUR (IV SOLUTION) ×2 IMPLANT
WEDGE TANGENT 10X26MM ×2 IMPLANT

## 2013-01-23 NOTE — Brief Op Note (Signed)
01/23/2013  4:47 PM  PATIENT:  Morgan Torres  62 y.o. female  PRE-OPERATIVE DIAGNOSIS:  stenosis  POST-OPERATIVE DIAGNOSIS:  Stenosis  PROCEDURE:  Procedure(s): Lumbar Five-Sacral One Posterior Lumbar Interbody Fusion (N/A)  SURGEON:  Surgeon(s) and Role:    * Temple Pacini, MD - Primary    * Cristi Loron, MD - Assisting  PHYSICIAN ASSISTANT:   ASSISTANTS:    ANESTHESIA:   general  EBL:  Total I/O In: 2500 [I.V.:2000; IV Piggyback:500] Out: 200 [Blood:200]  BLOOD ADMINISTERED:none  DRAINS: (Medium) Hemovact drain(s) in the Epidural space with  Suction Open   LOCAL MEDICATIONS USED:  MARCAINE     SPECIMEN:  No Specimen  DISPOSITION OF SPECIMEN:  N/A  COUNTS:  YES  TOURNIQUET:  * No tourniquets in log *  DICTATION: .Dragon Dictation  PLAN OF CARE: Admit to inpatient   PATIENT DISPOSITION:  PACU - hemodynamically stable.   Delay start of Pharmacological VTE agent (>24hrs) due to surgical blood loss or risk of bleeding: yes

## 2013-01-23 NOTE — Transfer of Care (Signed)
Immediate Anesthesia Transfer of Care Note  Patient: Morgan Torres  Procedure(s) Performed: Procedure(s): Lumbar Five-Sacral One Posterior Lumbar Interbody Fusion (N/A)  Patient Location: PACU  Anesthesia Type:General  Level of Consciousness: awake, oriented, sedated and patient cooperative  Airway & Oxygen Therapy: Patient Spontanous Breathing and Patient connected to face mask oxygen  Post-op Assessment: Report given to PACU RN, Post -op Vital signs reviewed and stable and Patient moving all extremities  Post vital signs: Reviewed and stable  Complications: No apparent anesthesia complications

## 2013-01-23 NOTE — Anesthesia Procedure Notes (Signed)
Procedure Name: Intubation Date/Time: 01/23/2013 12:55 PM Performed by: Leona Singleton A Pre-anesthesia Checklist: Patient identified, Emergency Drugs available, Suction available and Patient being monitored Patient Re-evaluated:Patient Re-evaluated prior to inductionOxygen Delivery Method: Circle system utilized Preoxygenation: Pre-oxygenation with 100% oxygen Intubation Type: IV induction Ventilation: Mask ventilation without difficulty and Oral airway inserted - appropriate to patient size Laryngoscope Size: Miller and 3 Grade View: Grade II Tube type: Oral Tube size: 7.5 mm Number of attempts: 1 Airway Equipment and Method: Stylet Placement Confirmation: ETT inserted through vocal cords under direct vision,  positive ETCO2 and breath sounds checked- equal and bilateral Secured at: 23 cm Tube secured with: Tape Dental Injury: Teeth and Oropharynx as per pre-operative assessment

## 2013-01-23 NOTE — Progress Notes (Signed)
Pt received from PACU per stretcher. Transferred to bed alert/oriented Oriented to room and nurse call button. Hemovac drain noted with bloody drainage in tubing. Lumbar dressing intact clean and dry. Daughter at bedside

## 2013-01-23 NOTE — Op Note (Signed)
Date of procedure: 01/23/2013  Date of dictation: Same  Service: Neurosurgery  Preoperative diagnosis: L5-S1 spondylosis with stenosis and neurogenic claudication  Postoperative diagnosis: Same  Procedure Name: L5-S1 redo decompressive laminectomy with bilateral L5 and S1 decompressive foraminotomies, more than would be required for simple interbody fusion alone  L5-S1 posterior lumbar interbody fusion utilizing tangent interbody allograft wedge Telamon interbody peek cage and local autograft.  L5-S1 posterior lateral arthrodesis utilizing nonsegmental pedicle screw instrumentation and local autograft.  Surgeon:Colter Magowan A.Abhinav Mayorquin, M.D.  Asst. Surgeon: Lovell Sheehan  Anesthesia: General  Indication: 63 year old female status post previous right-sided L5-S1 decompressive surgery presents now with recurrent back and bilateral lower extremity symptoms right greater than left. Symptoms have failed conservative management. Workup demonstrates evidence of progressive the space collapse and spondylosis with severe facet arthropathy and very severe neuroforaminal stenosis. Patient presents now for decompression infusion in hopes of improving her symptoms.  Operative note: After induction anesthesia, patient positioned prone onto Sewell frame and appropriately padded. Lumbar region prepped and draped. Incision made overlying the L5-S1 interspace. Dissection performed bilaterally. Retractor placed intraoperative fluoroscopy used levels confirmed. Previous laminotomy on the right L5-S1 was dissected free. Bilateral complete decompressive laminectomy of L5 and S1 was performed by removing the entire lamina of L5 inferior facets of L5 bilaterally superior facets of S1 bilaterally and the superior aspect of the S1 lamina. All bone was cleaned in use and later autograft in. Ligament flavum and epidural scar were elevated and resected piecemeal fashion. Very wide decompressive foraminotomies were performed along the  course the exiting L5 and S1 nerve roots bilaterally. During the process of the resecting epidural scar dorsolaterally on the right side a small durotomy was made. This was repaired primarily using a 5-0 Prolene suture in a watertight fashion. Bilateral discectomies were then performed at L5-S1. The spaces and distraction up to 10 mm with a 10 mm distractor less than patient's left side. Thecal sac and nerve roots were protected on the right side. The space was reamed and then cut with 10 mm tangent instance. Soft tissues removed and interspace. A 10 x 26 mm tangent wedge was then impacted in place recessed roughly 2 mm from the posterior cortical margin of L5. Distractor was removed and patient's left side. Thecal sac and nerve respect on the left side. Once again the space was reamed and then cut with 10 mm tangent instruments. Soft tissues removed and interspace. The spaces further curettage. Morselize autograft was packed in the interspace for later fusion. A 10 x 20 mm dome on cage packed with morselized autograft was packed into the interspace and recessed roughly 2 mm in the posterior cortical margin. Pedicles of L5 and S1 were notified using surface landmarks and intraoperative fluoroscopy. Superficial bone overlying the pedicle was then removed using a high-speed drill. Pedicles and probed using a pedicle awl each pedicle awl track was probed and found to be solidly within the bone. Screw tap 3 used. 5.75 x 45 mm radius branch screws were placed bilaterally at L5 6.75 x 40 mm screws placed bilaterally at S1. Transverse processes and sacroiliac were were decorticated using high-speed drill. Morselized autograft was packed posterolaterally for later fusion. Short segment titanium rods and placed over the screw heads at L5 and S1. Locking caps and placed over the screws were locking caps were then engaged with the construct under compression. Final images revealed good position the bone graft and hardware at  proper upper level with normal alignment of the spine. Wound is then  irrigated out like solution. DuraSeal was placed over the dural repair and Gelfoam was placed over the laminectomy defect. Wounds and close in layers with Vicryl sutures. A medium Hemovac drain was left in the epidural space. There were no apparent complications. Patient tolerated the procedure well and she returns to the recovery room postop.

## 2013-01-23 NOTE — Preoperative (Signed)
Beta Blockers   Reason not to administer Beta Blockers:Not Applicable 

## 2013-01-23 NOTE — Anesthesia Preprocedure Evaluation (Addendum)
Anesthesia Evaluation  Patient identified by MRN, date of birth, ID band Patient awake    Reviewed: Allergy & Precautions, H&P , NPO status , Patient's Chart, lab work & pertinent test results  History of Anesthesia Complications Negative for: history of anesthetic complications  Airway Mallampati: II TM Distance: >3 FB Neck ROM: Full    Dental  (+) Edentulous Upper, Poor Dentition and Dental Advisory Given   Pulmonary asthma , COPD COPD inhaler, Current Smoker,  breath sounds clear to auscultation  Pulmonary exam normal       Cardiovascular hypertension, Pt. on medications Rhythm:Regular Rate:Normal     Neuro/Psych Chronic back pain: narcotics daily    GI/Hepatic negative GI ROS, Neg liver ROS,   Endo/Other  Morbid obesity  Renal/GU negative Renal ROS     Musculoskeletal  (+) Fibromyalgia -  Abdominal (+) + obese,   Peds  Hematology   Anesthesia Other Findings   Reproductive/Obstetrics                          Anesthesia Physical Anesthesia Plan  ASA: III  Anesthesia Plan: General   Post-op Pain Management:    Induction: Intravenous  Airway Management Planned: Oral ETT  Additional Equipment:   Intra-op Plan:   Post-operative Plan: Extubation in OR  Informed Consent: I have reviewed the patients History and Physical, chart, labs and discussed the procedure including the risks, benefits and alternatives for the proposed anesthesia with the patient or authorized representative who has indicated his/her understanding and acceptance.   Dental advisory given  Plan Discussed with: Surgeon and CRNA  Anesthesia Plan Comments: (Plan routine monitors, GETA)        Anesthesia Quick Evaluation

## 2013-01-23 NOTE — H&P (Signed)
Morgan Torres is an 62 y.o. female.   Chief Complaint: Back and bilateral leg pain HPI: 62 year old female with progressive back and bilateral lower extremity pain right greater than left failing conservative management. Workup has demonstrated evidence of progressive breakdown the L5-S1 disc space with resultant severe foraminal stenosis. Patient is status post previous decompressive surgery on the right side at L5-S1. Remainder of her spine looks reasonably healthy. Patient presents now for L5-S1 decompression and fusion in hopes of improving her symptoms.  Past Medical History  Diagnosis Date  . Lumbar herniated disc   . Sciatica   . Hypertension   . Small bowel obstruction   . COPD (chronic obstructive pulmonary disease)   . Arthritis   . Depression   . Anxiety   . Bronchitis     Hx: of  . Pneumonia     Hx: of  . Fibromyalgia     Past Surgical History  Procedure Laterality Date  . Abdominal surgery      resection of small intestine  . Back surgery    . Abdominal hysterectomy    . Tonsillectomy    . Dilation and curettage of uterus    . Colonoscopy      Hx; of  . Hernia repair      Family History  Problem Relation Age of Onset  . Hypertension Mother   . Diabetes Mother   . Cancer - Other Mother   . Cancer Sister    Social History:  reports that she has been smoking Cigarettes.  She has a 10 pack-year smoking history. She has never used smokeless tobacco. She reports that  drinks alcohol. She reports that she does not use illicit drugs.  Allergies:  Allergies  Allergen Reactions  . Diclofenac Anaphylaxis  . Codeine Itching  . Hydrocodone     Medications Prior to Admission  Medication Sig Dispense Refill  . albuterol (PROVENTIL HFA;VENTOLIN HFA) 108 (90 BASE) MCG/ACT inhaler Inhale 1-2 puffs into the lungs every 6 (six) hours as needed for wheezing or shortness of breath.      . gabapentin (NEURONTIN) 100 MG capsule Take 100 mg by mouth at bedtime.      Marland Kitchen  LORazepam (ATIVAN) 1 MG tablet Take 1 mg by mouth daily as needed for anxiety.      Marland Kitchen losartan-hydrochlorothiazide (HYZAAR) 100-12.5 MG per tablet Take 1 tablet by mouth daily.      Marland Kitchen oxyCODONE-acetaminophen (PERCOCET) 10-325 MG per tablet Take 1 tablet by mouth every 4 (four) hours as needed for pain.      . traMADol (ULTRAM) 50 MG tablet Take 50 mg by mouth every 4 (four) hours as needed for pain.      Marland Kitchen EPINEPHrine (EPIPEN 2-PAK) 0.3 mg/0.3 mL DEVI Inject 0.3 mLs (0.3 mg total) into the muscle once as needed (for severe allergic reaction). CAll 911 immediately if you have to use this medicine  1 Device  1    Results for orders placed during the hospital encounter of 01/23/13 (from the past 48 hour(s))  BASIC METABOLIC PANEL     Status: Abnormal   Collection Time    01/23/13  9:55 AM      Result Value Range   Sodium 139  135 - 145 mEq/L   Potassium 4.4  3.5 - 5.1 mEq/L   Chloride 102  96 - 112 mEq/L   CO2 30  19 - 32 mEq/L   Glucose, Bld 113 (*) 70 - 99 mg/dL   BUN  11  6 - 23 mg/dL   Creatinine, Ser 4.69  0.50 - 1.10 mg/dL   Calcium 9.5  8.4 - 62.9 mg/dL   GFR calc non Af Amer 72 (*) >90 mL/min   GFR calc Af Amer 84 (*) >90 mL/min   Comment:            The eGFR has been calculated     using the CKD EPI equation.     This calculation has not been     validated in all clinical     situations.     eGFR's persistently     <90 mL/min signify     possible Chronic Kidney Disease.  CBC     Status: Abnormal   Collection Time    01/23/13  9:55 AM      Result Value Range   WBC 8.0  4.0 - 10.5 K/uL   RBC 4.79  3.87 - 5.11 MIL/uL   Hemoglobin 15.7 (*) 12.0 - 15.0 g/dL   HCT 52.8  41.3 - 24.4 %   MCV 92.9  78.0 - 100.0 fL   MCH 32.8  26.0 - 34.0 pg   MCHC 35.3  30.0 - 36.0 g/dL   RDW 01.0  27.2 - 53.6 %   Platelets 244  150 - 400 K/uL  TYPE AND SCREEN     Status: None   Collection Time    01/23/13  9:58 AM      Result Value Range   ABO/RH(D) B NEG     Antibody Screen NEG      Sample Expiration 01/26/2013     No results found.  Review of Systems  Constitutional: Negative.   HENT: Negative.   Eyes: Negative.   Respiratory: Negative.   Cardiovascular: Negative.   Gastrointestinal: Negative.   Genitourinary: Negative.   Musculoskeletal: Negative.   Skin: Negative.   Neurological: Negative.   Endo/Heme/Allergies: Negative.   Psychiatric/Behavioral: Negative.     Blood pressure 148/73, pulse 68, temperature 96.9 F (36.1 C), temperature source Oral, resp. rate 20, height 5\' 8"  (1.727 m), weight 102.059 kg (225 lb), SpO2 99.00%. Physical Exam  Constitutional: She is oriented to person, place, and time. She appears well-developed and well-nourished. No distress.  HENT:  Head: Normocephalic and atraumatic.  Right Ear: External ear normal.  Left Ear: External ear normal.  Nose: Nose normal.  Mouth/Throat: Oropharynx is clear and moist. No oropharyngeal exudate.  Eyes: Conjunctivae and EOM are normal. Pupils are equal, round, and reactive to light. Right eye exhibits no discharge. Left eye exhibits no discharge.  Neck: Normal range of motion. Neck supple. No tracheal deviation present. No thyromegaly present.  Cardiovascular: Normal rate, regular rhythm, normal heart sounds and intact distal pulses.  Exam reveals no friction rub.   No murmur heard. Respiratory: Effort normal and breath sounds normal. No respiratory distress. She has no wheezes.  GI: Soft. Bowel sounds are normal. She exhibits no distension. There is no tenderness.  Musculoskeletal: Normal range of motion. She exhibits no edema and no tenderness.  Neurological: She is alert and oriented to person, place, and time. She has normal reflexes. She displays normal reflexes. No cranial nerve deficit. She exhibits normal muscle tone. Coordination normal.  Moderate lumbar tenderness with associated spasm. Well-healed previous wound. Straight leg raising positive on right equivocal on the left. Mild  extensor hallucis longus weakness on right craniotomy for 5. Remainder of motor exam intact.  Skin: Skin is warm and dry. She is not diaphoretic.  Psychiatric: She has a normal mood and affect. Her behavior is normal. Judgment and thought content normal.     Assessment/Plan L5-S1 spondylosis and stenosis with neurogenic claudication. Plan L5-S1 decompressive laminectomy and foraminotomies followed by posterior lumbar interbody fusion utilizing tangent interbody allograft wedge Telamon interbody peek cage and local autograft. The symptoms become coupled with posterior lateral arthrodesis utilizing nonsegmental pedicle screw instrumentation and local autograft. Risks and benefits have been explained. Patient wishes to proceed.  Shantale Holtmeyer A 01/23/2013, 12:19 PM

## 2013-01-23 NOTE — Anesthesia Postprocedure Evaluation (Signed)
  Anesthesia Post-op Note  Patient: Morgan Torres  Procedure(s) Performed: Procedure(s): Lumbar Five-Sacral One Posterior Lumbar Interbody Fusion (N/A)  Patient Location: PACU  Anesthesia Type:General  Level of Consciousness: awake and alert   Airway and Oxygen Therapy: Patient Spontanous Breathing  Post-op Pain: none  Post-op Assessment: Post-op Vital signs reviewed, Patient's Cardiovascular Status Stable, Respiratory Function Stable, Patent Airway and No signs of Nausea or vomiting  Post-op Vital Signs: Reviewed and stable  Complications: No apparent anesthesia complications

## 2013-01-24 NOTE — Progress Notes (Signed)
Occupational Therapy Evaluation Patient Details Name: Morgan Torres MRN: [id number] DOB: 06-24-1951 Today's Date: 01/24/2013 Time: 4098-1191 OT Time Calculation (min): 29 min  OT Assessment / Plan / Recommendation History of present illness Pt. underwent L5-S1 PLIF for back pain, foraminal stenosis, and R>L LE pain.   Clinical Impression   PTA, pt lived alone and was independent with all ADl and mobility. Pt will benefit from skilled OT services to facilitate D/C to home with HHOT and intermittent S from family due to below deficits.     OT Assessment  Patient needs continued OT Services    Follow Up Recommendations  Home health OT    Barriers to Discharge Decreased caregiver support    Equipment Recommendations  3 in 1 bedside comode RW   Recommendations for Other Services    Frequency  Min 2X/week    Precautions / Restrictions Precautions Precautions: Back Precaution Booklet Issued: Yes (comment) Precaution Comments: pt. educated on log rolling techcnique and back precautions; she was provided handout Required Braces or Orthoses: Spinal Brace Spinal Brace: Lumbar corset;Applied in sitting position Restrictions Weight Bearing Restrictions: No   Pertinent Vitals/Pain 5. Back. Repositioned. Ice to back    ADL  Upper Body Bathing: Set up Where Assessed - Upper Body Bathing: Unsupported sitting Lower Body Bathing: Moderate assistance Where Assessed - Lower Body Bathing: Supported sit to stand Upper Body Dressing: Minimal assistance Where Assessed - Upper Body Dressing: Unsupported sitting Lower Body Dressing: Moderate assistance Where Assessed - Lower Body Dressing: Supported sit to stand Toilet Transfer: Hydrographic surveyor Method: Sit to stand;Stand pivot Toileting - Clothing Manipulation and Hygiene: Moderate assistance Where Assessed - Toileting Clothing Manipulation and Hygiene: Sit to stand from 3-in-1 or toilet Equipment Used: Rolling  walker Transfers/Ambulation Related to ADLs: minguard ADL Comments: will benefit from AE. discussed DME needs  Educated on back precautions during ADL and possible need for AE. Discussed hygiene after toileting with AE   OT Diagnosis: Generalized weakness;Acute pain  OT Problem List: Decreased strength;Decreased activity tolerance;Decreased knowledge of use of DME or AE;Decreased knowledge of precautions;Obesity;Pain OT Treatment Interventions: Self-care/ADL training;Energy conservation;DME and/or AE instruction;Therapeutic activities;Patient/family education   OT Goals(Current goals can be found in the care plan section) Acute Rehab OT Goals Patient Stated Goal: return home for recovery with family/friend assist OT Goal Formulation: With patient Time For Goal Achievement: 02/07/13 Potential to Achieve Goals: Good  Visit Information  Last OT Received On: 01/24/13 Assistance Needed: +1 History of Present Illness: Pt. underwent L5-S1 PLIF for back pain, foraminal stenosis, and R>L LE pain.       Prior Functioning     Home Living Family/patient expects to be discharged to:: Private residence Living Arrangements: Alone Available Help at Discharge: Family;Friend(s);Available PRN/intermittently Type of Home: House Home Access: Stairs to enter [employer] of Steps: 4 Entrance Stairs-Rails: Right;Left;Can reach both Home Layout: One level Home Equipment: None Prior Function Level of Independence: Independent with assistive device(s) (cane on PRN basis) Communication Communication: No difficulties         Vision/Perception Vision - History Baseline Vision: No visual deficits   Cognition  Cognition Arousal/Alertness: Awake/alert Behavior During Therapy: WFL for tasks assessed/performed Overall Cognitive Status: Within Functional Limits for tasks assessed    Extremity/Trunk Assessment Upper Extremity Assessment Upper Extremity Assessment: Overall WFL for tasks  assessed Lower Extremity Assessment Lower Extremity Assessment: Overall WFL for tasks assessed Cervical / Trunk Assessment Cervical / Trunk Assessment: Other exceptions (fusion)     Mobility Bed  Mobility Bed Mobility: Sit to Sidelying Left Rolling Left: 4: Min guard Left Sidelying to Sit: 4: Min assist;HOB flat Sitting - Scoot to Edge of Bed: 5: Supervision Details for Bed Mobility Assistance: educated on proper technique for bed mobiilty Transfers Transfers: Sit to Stand;Stand to Sit Sit to Stand: With upper extremity assist;4: Min guard;From chair/3-in-1 Stand to Sit: With armrests;4: Min guard;To bed Details for Transfer Assistance: cues for technique and hand placement     Exercise     Balance Balance Balance Assessed:  (WFL for ADL)   End of Session OT - End of Session Equipment Utilized During Treatment: Rolling walker;Back brace Activity Tolerance: Patient tolerated treatment well Patient left: in bed;with call bell/phone within reach;with family/visitor present Nurse Communication: Mobility status;Precautions  GO     Marlon Suleiman,HILLARY 01/24/2013, 6:20 PM [facility], OTR/L  [phone] 01/24/2013

## 2013-01-24 NOTE — Progress Notes (Signed)
Patient ID: Morgan Torres, female   DOB: June 08, 1951, 62 y.o.   MRN: [id number] Afeb, vss Pain under pretty good control Needs to start mobilizing. Home next 1 to 2 days

## 2013-01-24 NOTE — Evaluation (Signed)
Physical Therapy Evaluation Patient Details Name: Morgan Torres MRN: [id number] DOB: [birth year] Today's Date: 01/24/2013 Time: 5573-2202 PT Time Calculation (min): 34 min  PT Assessment / Plan / Recommendation History of Present Illness  Pt. underwent L5-S1 PLIF for back pain, foraminal stenosis, and R>L LE pain.  Clinical Impression  Pt. Presents to PT with a decrease in her usual level of independence in gait and functional mobility.  She needs acute PT to address these and below issues in preparation for safe return home with assist.     PT Assessment  Patient needs continued PT services    Follow Up Recommendations  Home health PT;Supervision/Assistance - 24 hour;Supervision for mobility/OOB    Does the patient have the potential to tolerate intense rehabilitation      Barriers to Discharge        Equipment Recommendations  Rolling walker with 5" wheels;3in1 (PT)    Recommendations for Other Services     Frequency Min 6X/week    Precautions / Restrictions Precautions Precautions: Back Precaution Booklet Issued: Yes (comment) Precaution Comments: pt. educated on log rolling techcnique and back precautions; she was provided handout Required Braces or Orthoses: Spinal Brace Spinal Brace: Lumbar corset;Applied in sitting position Restrictions Weight Bearing Restrictions: No   Pertinent Vitals/Pain See vitals tab       Mobility  Bed Mobility Bed Mobility: Rolling Left;Left Sidelying to Sit;Sitting - Scoot to [place] of Bed Rolling Left: 4: Min assist Left Sidelying to Sit: 4: Min assist;HOB flat Sitting - Scoot to Edge of Bed: 5: Supervision Details for Bed Mobility Assistance: cues for hand placement on bed for rolling and for maintaining back precautions.  Min assist to assist pt. to upright. Transfers Transfers: Sit to Stand;Stand to Sit Sit to Stand: 4: Min assist;From bed;With upper extremity assist Stand to Sit: 4: Min assist;To chair/3-in-1;With  armrests Details for Transfer Assistance: cues for technique and hand placement Ambulation/Gait Ambulation/Gait Assistance: 4: Min assist Ambulation Distance (Feet): 50 Feet Assistive device: Rolling walker Ambulation/Gait Assistance Details: Pt. needed min assist for safety and stability Gait Pattern: Step-to pattern;Decreased step length - right;Decreased step length - left Gait velocity: slow    Exercises     PT Diagnosis: Difficulty walking;Abnormality of gait;Acute pain  PT Problem List: Decreased activity tolerance;Decreased mobility;Decreased knowledge of use of DME;Decreased knowledge of precautions;Pain PT Treatment Interventions: DME instruction;Gait training;Stair training;Functional mobility training;Therapeutic activities;Patient/family education     PT Goals(Current goals can be found in the care plan section) Acute Rehab PT Goals Patient Stated Goal: return home for recovery with family/friend assist PT Goal Formulation: With patient Time For Goal Achievement: 01/31/13 Potential to Achieve Goals: Good  Visit Information  Last PT Received On: 01/24/13 Assistance Needed: +1 History of Present Illness: Pt. underwent L5-S1 PLIF for back pain, foraminal stenosis, and R>L LE pain.       Prior Functioning  Home Living Family/patient expects to be discharged to:: Private residence Living Arrangements: Alone Available Help at Discharge: Family;Friend(s);Available 24 hours/day Type of Home: House Home Access: Stairs to enter [employer] of Steps: 4 Entrance Stairs-Rails: Right;Left;Can reach both Home Layout: One level Home Equipment: None Prior Function Level of Independence: Independent with assistive device(s) (cane on PRN basis) Communication Communication: No difficulties    Cognition  Cognition Arousal/Alertness: Awake/alert Behavior During Therapy: WFL for tasks assessed/performed Overall Cognitive Status: Within Functional Limits for tasks  assessed    Extremity/Trunk Assessment Upper Extremity Assessment Upper Extremity Assessment: Defer to OT evaluation Lower Extremity Assessment  Lower Extremity Assessment: Overall WFL for tasks assessed   Balance    End of Session PT - End of Session Equipment Utilized During Treatment: Gait belt;Back brace Activity Tolerance: Patient tolerated treatment well Patient left: in chair;with call bell/phone within reach;with family/visitor present Nurse Communication: Mobility status;Precautions (hemovac and foley need to be emptied)  GP     Ferman Hamming 01/24/2013, 4:45 PM Weldon Picking PT Acute Rehab Services [phone] Bee[place] [phone]

## 2013-01-25 MED ORDER — PNEUMOCOCCAL VAC POLYVALENT 25 MCG/0.5ML IJ INJ
0.5000 mL | INJECTION | INTRAMUSCULAR | Status: AC
  Administered 2013-01-26: 0.5 mL via INTRAMUSCULAR
  Filled 2013-01-25 (×2): qty 0.5

## 2013-01-25 MED ORDER — GABAPENTIN 100 MG PO CAPS
100.0000 mg | ORAL_CAPSULE | Freq: Three times a day (TID) | ORAL | Status: DC
  Administered 2013-01-25 – 2013-01-28 (×9): 100 mg via ORAL
  Filled 2013-01-25 (×12): qty 1

## 2013-01-25 NOTE — Progress Notes (Signed)
Patient ID: Morgan Torres, female   DOB: 01-Mar-1951, 62 y.o.   MRN: [id number] I forgot to mention the patient has a headache. It is not postural. Her wound looks good.

## 2013-01-25 NOTE — Progress Notes (Signed)
Physical Therapy Treatment Patient Details Name: Morgan Torres MRN: [id number] DOB: 1950/12/20 Today's Date: 01/25/2013 Time: 2952-8413 PT Time Calculation (min): 23 min  PT Assessment / Plan / Recommendation  History of Present Illness Pt. underwent L5-S1 PLIF for back pain, foraminal stenosis, and R>L LE pain.   PT Comments   Pt agreeable to participate in PT session.  C/o back pain, mild dizziness, & nausea throughout session.  RN aware.  Spoke with RN prior to PT session Re: pt's episode last night with severe HA, nausea, & dizziness when going to bathroom.  RN ok'd to proceed with therapy at this time & to monitor if pt has same symptoms again with activity.  RN ok'd to allow pt to sit up in recliner at end of session.     Follow Up Recommendations  Home health PT;Supervision/Assistance - 24 hour;Supervision for mobility/OOB     Does the patient have the potential to tolerate intense rehabilitation     Barriers to Discharge        Equipment Recommendations  Rolling walker with 5" wheels;3in1 (PT)    Recommendations for Other Services    Frequency Min 6X/week   Progress towards PT Goals Progress towards PT goals: Progressing toward goals  Plan Current plan remains appropriate    Precautions / Restrictions Precautions Precautions: Back Precaution Comments: pt. educated on log rolling techcnique and back precautions; she was provided handout Required Braces or Orthoses: Spinal Brace Spinal Brace: Lumbar corset;Applied in sitting position Restrictions Weight Bearing Restrictions: No   Pertinent Vitals/Pain C/o back pain, nausea, & mild dizziness.  Denies headache at this time.     Mobility  Bed Mobility Bed Mobility: Right Sidelying to Sit;Sitting - Scoot to Edge of Bed Right Sidelying to Sit: HOB flat;4: Min assist;With rails Details for Bed Mobility Assistance: min assist to lift shoulders/trunk to sitting upright.  Increased time & effortful.  Cues for sequencing &  technique.   Transfers Transfers: Sit to Stand;Stand to Sit Sit to Stand: 4: Min assist;With upper extremity assist;With armrests;From bed;From chair/3-in-1 Stand to Sit: 4: Min guard;With upper extremity assist;With armrests;To bed;To chair/3-in-1 Details for Transfer Assistance: cues for hand placement & technique.   Ambulation/Gait Ambulation/Gait Assistance: 4: Min guard Ambulation Distance (Feet): 60 Feet Assistive device: Rolling walker Ambulation/Gait Assistance Details: Guarding for safety.  Pt c/o mild dizziness, nausea, & back pain.  Very stiff/guarded posture.   Gait Pattern: Step-through pattern;Decreased stride length (decreased step height) Gait velocity: slow Stairs: No Wheelchair Mobility Wheelchair Mobility: No      PT Goals (current goals can now be found in the care plan section) Acute Rehab PT Goals Patient Stated Goal: return home for recovery with family/friend assist PT Goal Formulation: With patient Time For Goal Achievement: 01/31/13 Potential to Achieve Goals: Good  Visit Information  Last PT Received On: 01/25/13 Assistance Needed: +1 History of Present Illness: Pt. underwent L5-S1 PLIF for back pain, foraminal stenosis, and R>L LE pain.    Subjective Data  Patient Stated Goal: return home for recovery with family/friend assist   Cognition  Cognition Arousal/Alertness: Awake/alert Behavior During Therapy: WFL for tasks assessed/performed Overall Cognitive Status: Within Functional Limits for tasks assessed    Balance     End of Session PT - End of Session Equipment Utilized During Treatment: Gait belt;Back brace Activity Tolerance: Patient tolerated treatment well Patient left: in chair;with call bell/phone within reach Nurse Communication: Mobility status   GP     Lara Mulch 01/25/2013, 10:27  AM   Verdell Face, PTA [phone] 01/25/2013

## 2013-01-25 NOTE — Progress Notes (Signed)
Patient ID: Morgan Torres, female   DOB: [birth year], 62 y.o.   MRN: [id number] Subjective: The patient is alert and pleasant. She complains of some pain in her bilateral buttocks.  Objective: Vital signs in last 24 hours: Temp:  [97.6 F (36.4 C)-98.1 F (36.7 C)] 98.1 F (36.7 C) (08/03 0908) Pulse Rate:  [52-64] 64 (08/03 0908) Resp:  [16-20] 19 (08/03 0908) BP: (114-132)/(51-59) 132/51 mmHg (08/03 0908) SpO2:  [94 %-100 %] 98 % (08/03 0908)  Intake/Output from previous day: 08/02 0701 - 08/03 0700 In: 600 [P.O.:600] Out: 2420 [Urine:2000; Drains:420] Intake/Output this shift: Total I/O In: 240 [P.O.:240] Out: -   Physical exam  Patient is moving her lower extremities well. She looks well.  Lab Results:  Recent Labs  01/23/13 0955  WBC 8.0  HGB 15.7*  HCT 44.5  PLT 244   BMET  Recent Labs  01/23/13 0955  NA 139  K 4.4  CL 102  CO2 30  GLUCOSE 113*  BUN 11  CREATININE 0.84  CALCIUM 9.5    Studies/Results: Dg Lumbar Spine 2-3 Views  01/23/2013   *RADIOLOGY REPORT*  Clinical Data: Lumbar fusion.  DG C-ARM 1-60 MIN,LUMBAR SPINE - 2-3 VIEW  Comparison:  MRI 12/15/2012.  Findings:   There are pedicle screws bilaterally at L5 and S1 along with an interbody bone spacer.  The screws appear well-positioned without complicating features.  IMPRESSION:  Fusion hardware in good position without complicating features.   Original Report Authenticated By: Rudie Meyer, M.D.   Dg C-arm 1-60 Min  01/23/2013   *RADIOLOGY REPORT*  Clinical Data: Lumbar fusion.  DG C-ARM 1-60 MIN,LUMBAR SPINE - 2-3 VIEW  Comparison:  MRI 12/15/2012.  Findings:   There are pedicle screws bilaterally at L5 and S1 along with an interbody bone spacer.  The screws appear well-positioned without complicating features.  IMPRESSION:  Fusion hardware in good position without complicating features.   Original Report Authenticated By: Rudie Meyer, M.D.    Assessment/Plan: Postop day #2: Colon increase her  Neurontin to 3 times a day. We will mobilize her. She may go home tomorrow.  LOS: 2 days     Thurl Boen D 01/25/2013, 12:09 PM

## 2013-01-25 NOTE — Progress Notes (Signed)
Around 0200, RN called to room by nurse tech. Pt in bathroom stating that she feels lightheaded and nauseous. Pt also complaining of "horrible" head ache and neck ache. Steady used to place pt back into bed. BP 128/53, pulse 52. Pt placed flat in bed, states that her pain is decreasing. Given 4 mg Zofran for nausea and 2 Percocet tablets for pain. Will continue to keep pt flat in bed to decrease headache. Explained bedrest to pt, she stated that she understood. RN will inform AM neurosurgeon coming on. Salvadore Oxford, RN [phone] 01/25/13

## 2013-01-25 NOTE — Progress Notes (Addendum)
Occupational Therapy Treatment Patient Details Name: Morgan Torres MRN: [id number] DOB: 1950/11/22 Today's Date: 01/25/2013 Time: 4098-1191 OT Time Calculation (min): 25 min  OT Assessment / Plan / Recommendation  History of present illness Pt. underwent L5-S1 PLIF for back pain, foraminal stenosis, and R>L LE pain.   OT comments  Practiced with AE for LB ADLs. Performed toileting tasks and grooming at sink. Spoke with pt about getting toilet aid for hygiene.   Follow Up Recommendations  Home health OT    Barriers to Discharge       Equipment Recommendations  3 in 1 bedside comode    Recommendations for Other Services    Frequency Min 2X/week   Progress towards OT Goals Progress towards OT goals: Progressing toward goals  Plan Discharge plan remains appropriate    Precautions / Restrictions Precautions Precautions: Back Precaution Comments: pt stated 2/3 back precautions. Required Braces or Orthoses: Spinal Brace Spinal Brace: Lumbar corset;Applied in sitting position Restrictions Weight Bearing Restrictions: No   Pertinent Vitals/Pain Pain 6/10. Repositioned. Nurse notified.     ADL  Grooming: Performed;Wash/dry hands;Supervision/safety Where Assessed - Grooming: Supported standing Lower Body Dressing: Performed;Min guard Where Assessed - Lower Body Dressing: Supported sit to Pharmacist, hospital: Performed;Min Pension scheme manager Method: Sit to Barista: Raised toilet seat with arms (or 3-in-1 over toilet) Toileting - Clothing Manipulation and Hygiene: Minimal assistance Where Assessed - Engineer, mining and Hygiene: Sit to stand from 3-in-1 or toilet Equipment Used: Gait belt;Back brace;Rolling walker Transfers/Ambulation Related to ADLs: Min A/Minguard for transfers. Minguard for ambulation. ADL Comments: Pt performed toileting tasks and OT recommended and spoke with pt about toilet aid for hygiene. Pt practiced with  sockaid and reacher for LB dressing. Donned underwear and donned/doffed sock. Overall Minguard level for LB dressing today. Cues to maintain precautions during session. Practiced donning brace and at supervision/setup level. Talked about tub transfer technique with 3 in 1.    OT Diagnosis:    OT Problem List:   OT Treatment Interventions:     OT Goals(current goals can now be found in the care plan section) Acute Rehab OT Goals Patient Stated Goal: not stated OT Goal Formulation: With patient Time For Goal Achievement: 02/07/13 Potential to Achieve Goals: Good ADL Goals Pt Will Perform Lower Body Bathing: with modified independence;with adaptive equipment;sit to/from stand Pt Will Perform Lower Body Dressing: with modified independence;with adaptive equipment;sit to/from stand Pt Will Transfer to Toilet: with modified independence;ambulating;bedside commode Pt Will Perform Toileting - Clothing Manipulation and hygiene: with modified independence;with adaptive equipment;sit to/from stand Additional ADL Goal #1: independently demonstrate back precautions during ADL   Visit Information  Last OT Received On: 01/25/13 Assistance Needed: +1 History of Present Illness: Pt. underwent L5-S1 PLIF for back pain, foraminal stenosis, and R>L LE pain.    Subjective Data      Prior Functioning       Cognition  Cognition Arousal/Alertness: Awake/alert Behavior During Therapy: WFL for tasks assessed/performed Overall Cognitive Status: Within Functional Limits for tasks assessed    Mobility  Bed Mobility Bed Mobility: Rolling Right;Right Sidelying to Sit;Sitting - Scoot to [place] of Bed Rolling Right: 4: Min assist Right Sidelying to Sit: 4: Min assist;HOB flat Sitting - Scoot to Edge of Bed: 5: Supervision Details for Bed Mobility Assistance: Min A for trunk and to keep hips in alignment with body when rolling. Transfers Transfers: Sit to Stand;Stand to Sit Sit to Stand: 4: Min guard;4: Min  assist;With upper  extremity assist;From chair/3-in-1;From bed Stand to Sit: 4: Min guard;With upper extremity assist;To bed;To chair/3-in-1 Details for Transfer Assistance: Min A for sit to stand from bed.     Exercises      Balance     End of Session OT - End of Session Equipment Utilized During Treatment: Gait belt;Rolling walker;Back brace Activity Tolerance: Patient tolerated treatment well Patient left: in chair;with call bell/phone within reach;with family/visitor present Nurse Communication: Mobility status;Patient requests pain meds  GO     Earlie Raveling OTR/L [phone] 01/25/2013, 3:16 PM

## 2013-01-26 MED FILL — Heparin Sodium (Porcine) Inj 1000 Unit/ML: INTRAMUSCULAR | Qty: 30 | Status: AC

## 2013-01-26 MED FILL — Sodium Chloride IV Soln 0.9%: INTRAVENOUS | Qty: 1000 | Status: AC

## 2013-01-26 NOTE — Plan of Care (Signed)
Pt attempted to place new iv. Pt states hurting and requested that nurse takes iv cath out. Iv team called to come and start new iv.

## 2013-01-26 NOTE — Progress Notes (Signed)
Occupational Therapy Treatment Patient Details Name: Morgan Torres MRN: [id number] DOB: 1950/10/03 Today's Date: 01/26/2013 Time: 4098-1191 OT Time Calculation (min): 39 min  OT Assessment / Plan / Recommendation  History of present illness Pt. underwent L5-S1 PLIF for back pain, foraminal stenosis, and R>L LE pain.   OT comments  Pt requesting second visit to clarify use of AE and DME.  Pt will have family members to purchase tub equipment, AE kit, and tongs for toileting.  Follow Up Recommendations  Home health OT    Barriers to Discharge       Equipment Recommendations  3 in 1 bedside comode    Recommendations for Other Services    Frequency Min 2X/week   Progress towards OT Goals Progress towards OT goals: Progressing toward goals  Plan Discharge plan remains appropriate    Precautions / Restrictions Precautions Precautions: Back Precaution Comments: educated pt in back precautions related to ADL and IADL Required Braces or Orthoses: Spinal Brace Spinal Brace: Lumbar corset;Applied in sitting position   Pertinent Vitals/Pain Head ache, did not rate, RN notified    ADL  Toilet Transfer: Radiographer, therapeutic Method: Sit to Barista: Raised toilet seat with arms (or 3-in-1 over toilet) Toileting - Clothing Manipulation and Hygiene: Minimal assistance Where Assessed - Engineer, mining and Hygiene: Standing Equipment Used:  (toilet aides, shower seats) ADL Comments: Pt requesting second visit to answer questions about various toilet aides and where to purchase.  Cautioned pt that 3 in 1 may not fit in tub and educated her in availability of shower seats and gave her a handout with pictures.    OT Diagnosis:    OT Problem List:   OT Treatment Interventions:     OT Goals(current goals can now be found in the care plan section) ADL Goals Pt Will Perform Lower Body Bathing: with modified independence;with adaptive  equipment;sit to/from stand Pt Will Perform Lower Body Dressing: with modified independence;with adaptive equipment;sit to/from stand Pt Will Transfer to Toilet: with modified independence;ambulating;bedside commode Pt Will Perform Toileting - Clothing Manipulation and hygiene: with modified independence;with adaptive equipment;sit to/from stand Additional ADL Goal #1: independently demonstrate back precautions during ADL   Visit Information  Last OT Received On: 01/26/13 Assistance Needed: +1 History of Present Illness: Pt. underwent L5-S1 PLIF for back pain, foraminal stenosis, and R>L LE pain.    Subjective Data      Prior Functioning       Cognition  Cognition Arousal/Alertness: Awake/alert Behavior During Therapy: WFL for tasks assessed/performed Overall Cognitive Status: Within Functional Limits for tasks assessed Area of Impairment: Memory    Mobility  Bed Mobility Bed Mobility: Left Sidelying to Sit;Sitting - Scoot to Edge of Bed Left Sidelying to Sit: 6: Modified independent (Device/Increase time);With rails Sitting - Scoot to Edge of Bed: 7: Independent Min assist, sit to sidelying Left for LEs Transfers Sit to Stand: 5: Supervision Stand to Sit: 5: Supervision;Without upper extremity assist;With armrests;To chair/3-in-1 Details for Transfer Assistance: verbal cues for hand placement    Exercises      Balance     End of Session OT - End of Session Activity Tolerance: Patient tolerated treatment well Patient left: in bed;with call bell/phone within reach Nurse Communication:  (pt with headache)  GO     Evern Bio 01/26/2013, 3:23 PM [phone]

## 2013-01-26 NOTE — Progress Notes (Signed)
UR COMPLETED  

## 2013-01-26 NOTE — Progress Notes (Signed)
Occupational Therapy Treatment Patient Details Name: Morgan Torres MRN: [id number] DOB: December 06, 1950 Today's Date: 01/26/2013 Time: 4098-1191 OT Time Calculation (min): 30 min  OT Assessment / Plan / Recommendation       OT comments  Pt. Doing well today, reports pain is better than previous days.  Able to complete toileting tasks s/mina, and simulated tub transfer min a.  Reviewed a/e needs and benefits.  Reviewed back precautions.  Follow Up Recommendations  Home health OT           Equipment Recommendations  3 in 1 bedside comode        Frequency Min 2X/week   Progress towards OT Goals Progress towards OT goals: Progressing toward goals  Plan Discharge plan remains appropriate    Precautions / Restrictions Precautions Precautions: Back Precaution Comments: pt. stated 2/3, required cues to remember "no twisting" Required Braces or Orthoses: Spinal Brace Spinal Brace: Lumbar corset;Applied in sitting position   Pertinent Vitals/Pain 4/10, states pain is improved from previous days    ADL  Grooming: Performed;Wash/dry hands;Supervision/safety Where Assessed - Grooming: Unsupported standing Toilet Transfer: Performed;Min guard Statistician Method: Sit to Barista: Raised toilet seat with arms (or 3-in-1 over toilet) Toileting - Clothing Manipulation and Hygiene: Performed;Minimal assistance Where Assessed - Engineer, mining and Hygiene: Standing Tub/Shower Transfer: Simulated;Minimal assistance (side step in/out of tub with use of walker) Tub/Shower Transfer Method: Other (comment) Tub/Shower Transfer Equipment: Other (comment) (will use 3-n-1 in the tub) Equipment Used: Back brace;Rolling walker Transfers/Ambulation Related to ADLs: s for all aspects of transfers and ambulation ADL Comments: min a for toilet hygiene while using flushable wipes.  reviewed again need for toilet aide, pt. agrees and plans on using tongs.  states  family to purchase a/e kit today in prep for d/c home.  min a for simulated tub transfer, will use 3-n-1 in tub as seat         OT Goals(current goals can now be found in the care plan section)    Visit Information  Last OT Received On: 01/26/13                 Cognition  Cognition Arousal/Alertness: Awake/alert Behavior During Therapy: [facility] for tasks assessed/performed Overall Cognitive Status: Within Functional Limits for tasks assessed    Mobility  Transfers Transfers: Sit to Stand;Stand to Sit Sit to Stand: 5: Supervision;With upper extremity assist;From toilet Stand to Sit: 5: Supervision;Without upper extremity assist;With armrests;To chair/3-in-1               End of Session OT - End of Session Equipment Utilized During Treatment: Back brace;Rolling walker Activity Tolerance: Patient tolerated treatment well Patient left: in chair;with call bell/phone within reach       Robet Leu, COTA/L 01/26/2013, 8:38 AM

## 2013-01-26 NOTE — Progress Notes (Signed)
Postop day 3. Patient's pain reasonably well-controlled. Mobility still marginal for discharge home.  Afebrile. Vital stable. Awake and alert. Oriented and appropriate. Motor and sensory function intact. Dressing clean and dry. Chest and abdomen benign.  Progressing reasonably well following lumbar decompression and fusion surgery. Continue efforts at mobilization. Possible discharge tomorrow.

## 2013-01-26 NOTE — Progress Notes (Signed)
Physical Therapy Treatment Patient Details Name: Morgan Torres MRN: [id number] DOB: 1951-06-15 Today's Date: 01/26/2013 Time: 1119-1208 PT Time Calculation (min): 49 min  PT Assessment / Plan / Recommendation  History of Present Illness Pt. underwent L5-S1 PLIF for back pain, foraminal stenosis, and R>L LE pain.   PT Comments   Pt primarily limited by fatigue this session. Mobilizing well and safely, however fatigues quickly. Pt with multiple questions related to OT/ADLs. Will ask them to work with her again prior to d/c.   Follow Up Recommendations  Home health PT;Supervision/Assistance - 24 hour (initial few days and pt says family can do this)     Does the patient have the potential to tolerate intense rehabilitation     Barriers to Discharge        Equipment Recommendations  Rolling walker with 5" wheels;3in1 (PT)    Recommendations for Other Services    Frequency Min 6X/week   Progress towards PT Goals Progress towards PT goals: Progressing toward goals  Plan Current plan remains appropriate    Precautions / Restrictions Precautions Precautions: Back Precaution Comments: pt stated 2/3 and required visual demonstration to state "no arching" Required Braces or Orthoses: Spinal Brace Spinal Brace: Lumbar corset;Applied in sitting position   Pertinent Vitals/Pain "it's not bad...better than I was expecting" re: back pain    Mobility  Bed Mobility Bed Mobility: Left Sidelying to Sit;Sitting - Scoot to Edge of Bed Left Sidelying to Sit: 6: Modified independent (Device/Increase time);With rails Sitting - Scoot to Edge of Bed: 7: Independent Transfers Transfers: Sit to Stand;Stand to Sit Sit to Stand: 5: Supervision Stand to Sit: 5: Supervision;Without upper extremity assist;With armrests;To chair/3-in-1 Details for Transfer Assistance: x 4 (various surfaces) vc for safe use of RW (when pt coming off taller bed--simulating home environment--she prefers to push down on RW  with both hands. Pt with questions re: car transfers and simulated car transfer while in our gym. Ambulation/Gait Ambulation/Gait Assistance: 5: Supervision Ambulation Distance (Feet): 170 Feet Assistive device: Rolling walker Ambulation/Gait Assistance Details: demonstrated safe, proper use of RW; supervision due to pt's report of feeling weak and not sure she could make it to the gym (pt returned to room via chair) Gait Pattern: Step-through pattern;Decreased stride length Gait velocity: slow Stairs: Yes Stairs Assistance: 4: Min guard Stair Management Technique: Two rails;Step to pattern;Forwards Number of Stairs: 2    Exercises     PT Diagnosis:    PT Problem List:   PT Treatment Interventions:     PT Goals (current goals can now be found in the care plan section)    Visit Information  Last PT Received On: 01/26/13 Assistance Needed: +1 History of Present Illness: Pt. underwent L5-S1 PLIF for back pain, foraminal stenosis, and R>L LE pain.    Subjective Data  Subjective: They've tried to tell me twice how to use the toilet aid.Marland KitchenMarland KitchenI just don't get it. Pt also inquiring about a seat for her tub/shower.   Cognition  Cognition Arousal/Alertness: Lethargic Behavior During Therapy: WFL for tasks assessed/performed Overall Cognitive Status: Impaired/Different from baseline (difficulty remembering what OT went over with her tub transf) Area of Impairment: Memory    Balance     End of Session PT - End of Session Equipment Utilized During Treatment: Back brace Activity Tolerance: Patient limited by fatigue Patient left: in chair;with call bell/phone within reach   GP     Rosalba Totty 01/26/2013, 12:20 PM Pager [phone]

## 2013-01-27 MED ORDER — BUTALBITAL-APAP-CAFFEINE 50-325-40 MG PO TABS
1.0000 | ORAL_TABLET | ORAL | Status: DC | PRN
  Administered 2013-01-27: 1 via ORAL
  Filled 2013-01-27 (×2): qty 2
  Filled 2013-01-27: qty 1

## 2013-01-27 NOTE — Progress Notes (Signed)
Physical Therapy Treatment Patient Details Name: Morgan Torres MRN: [id number] DOB: 31-May-1951 Today's Date: 01/27/2013 Time: 7829-5621 PT Time Calculation (min): 34 min  PT Assessment / Plan / Recommendation  History of Present Illness Pt. underwent L5-S1 PLIF for back pain, foraminal stenosis, and R>L LE pain.   PT Comments   Pt still requiring instruction re: back precautions (especially related to bed mobility). Her overall mobility is at a supervision level, with occasional need for cues for back precautions. Pt also reports she does not feel safe mobilizing by herself due to extreme fatigue (overall and in her legs).   Follow Up Recommendations  Home health PT;Supervision/Assistance - 24 hour (initial few days and pt says family can do this)     Does the patient have the potential to tolerate intense rehabilitation     Barriers to Discharge        Equipment Recommendations  Rolling walker with 5" wheels;3in1 (PT)    Recommendations for Other Services    Frequency Min 5X/week   Progress towards PT Goals Progress towards PT goals: Progressing toward goals  Plan Current plan remains appropriate    Precautions / Restrictions Precautions Precautions: Back Precaution Comments: pt stated 3/3 (although used some different words, described all motions correctly); required cues x 3 during bed mobility to maintain Required Braces or Orthoses: Spinal Brace Spinal Brace: Lumbar corset;Applied in sitting position   Pertinent Vitals/Pain 2/10 low back pre- and post-activity; patient repositioned for comfort     Mobility  Bed Mobility Bed Mobility: Sitting - Scoot to Edge of Bed;Right Sidelying to Sit;Sit to Sidelying Right;Scooting to [facility] Rolling Right: 5: Supervision Right Sidelying to Sit: 5: Supervision;HOB flat Sitting - Scoot to Edge of Bed: 7: Independent Sit to Sidelying Right: 5: Supervision;HOB flat Scooting to HOB: 5: Supervision;With rail Details for Bed Mobility  Assistance: vc to prevent twisting as pt rolling and as entering bed; vc to avoid arching when scooting up in bed Transfers Transfers: Sit to Stand;Stand to Sit Sit to Stand: 5: Supervision Stand to Sit: 5: Supervision Details for Transfer Assistance: x2; vc for safest hand placement/sequencing with RW; pt rested on bench in hallway and was able to stand from this lower surface without armrests with cues Ambulation/Gait Ambulation/Gait Assistance: 5: Supervision Ambulation Distance (Feet): 75 Feet (x2; seated rest) Assistive device: Rolling walker Ambulation/Gait Assistance Details: very slow cadence; no cues for use of RW today; reports her legs still feel weak and does not feel safe to walk alone (even with RW) Gait Pattern: Step-through pattern;Decreased stride length Gait velocity: slow    Exercises     PT Diagnosis:    PT Problem List:   PT Treatment Interventions:     PT Goals (current goals can now be found in the care plan section) Acute Rehab PT Goals Patient Stated Goal: go home soon  Visit Information  Last PT Received On: 01/27/13 Assistance Needed: +1 History of Present Illness: Pt. underwent L5-S1 PLIF for back pain, foraminal stenosis, and R>L LE pain.    Subjective Data  Subjective: Pt with multiple questions re: post-d/c instructions (how long to wear brace, how long to follow back precautions, how long to use RW, etc) Patient Stated Goal: go home soon   Cognition  Cognition Arousal/Alertness: Awake/alert Behavior During Therapy: WFL for tasks assessed/performed Overall Cognitive Status: Within Functional Limits for tasks assessed (difficulty remembering what OT went over with her tub transf)    Balance     End of Session PT -  End of Session Equipment Utilized During Treatment: Back brace Activity Tolerance: Patient limited by fatigue Patient left: with call bell/phone within reach;in bed;with family/visitor present Nurse Communication: Mobility status    GP     Morgan Torres 01/27/2013, 2:11 PM Pager [phone]

## 2013-01-27 NOTE — Progress Notes (Signed)
Pt had urinary urgency overnight, now mostly resolved.  Will continue to monitor GU status.

## 2013-01-27 NOTE — Progress Notes (Signed)
Postop day 4. Patient complaining of more headache today. For headache does seem to be a little bit more postural than previously. She is not having any radicular pain but does note some achiness in her legs. Back pain is fairly well controlled.  She is afebrile. Her vitals are stable. She does not appear toxic or any significant distress. Her wound is clean and dry. Palpation the wound does not suggest pseudomeningocele or CSF collection. Motor and sensory function extremities normal.  Headaches are certainly worrisome given her small dural laceration. Clinically she does not appear to have a significant CSF leak. We will continue inpatient hospitalization and observation. I will add Fioricet in hopes of better managing her headaches.

## 2013-01-28 MED ORDER — OXYCODONE HCL 15 MG PO TABS: 15.0000 mg | ORAL_TABLET | ORAL | Status: DC | PRN

## 2013-01-28 MED ORDER — BUTALBITAL-APAP-CAFFEINE 50-325-40 MG PO TABS: 1.0000 | ORAL_TABLET | ORAL | Status: DC | PRN

## 2013-01-28 MED ORDER — DIAZEPAM 5 MG PO TABS: 5.0000 mg | ORAL_TABLET | Freq: Four times a day (QID) | ORAL | Status: DC | PRN

## 2013-01-28 NOTE — Care Management Note (Signed)
    Page 1 of 2   01/28/2013     11:15:26 AM   CARE MANAGEMENT NOTE 01/28/2013  Patient:  FRANCHELLE, FOSKETT   Account Number:  [id number]  Date Initiated:  01/27/2013  Documentation initiated by:  Jiles Crocker  Subjective/Objective Assessment:   ADMITTED FOR SURGERY - decompressive laminectomy     Action/Plan:   CM FOLLOWING FOR DCP; POSSIBLE HOME WITH [facility] SERVICES AT DISCHARGE   Anticipated DC Date:  01/30/2013   Anticipated DC Plan:  HOME W HOME HEALTH SERVICES      DC Planning Services  CM consult      Choice offered to / List presented to:  C-1 Patient   DME arranged  3-N-1  Levan Hurst      DME agency  [facility]     HH arranged  HH-2 PT  HH-3 OT      [facility] agency  [facility]   Status of service:  Completed, signed off Medicare Important Message given?  NA - LOS <3 / Initial given by admissions (If response is "NO", the following Medicare IM given date fields will be blank) Date Medicare IM given:   Date Additional Medicare IM given:    Discharge Disposition:  HOME W HOME HEALTH SERVICES  Per UR Regulation:  Reviewed for med. necessity/level of care/duration of stay  If discussed at Long Length of Stay Meetings, dates discussed:    Comments:  01/28/2013 1040 Elmer Bales RN, MSN, CM- Met with patient to discuss home health needs. Pt has chosen [facility] for [facility] PT/OT.  Mary with [facility] was notified and has accepted the referral.   01/26/2013- B CHANDLER RN,BSN,MHA

## 2013-01-28 NOTE — Progress Notes (Signed)
Patient discharged to home in no acute distress, accompanied by family member with DME(walker, BSC and lumbar brace)

## 2013-01-28 NOTE — Progress Notes (Signed)
Physical Therapy Treatment Patient Details Name: Morgan Torres MRN: [id number] DOB: [birth year] Today's Date: 01/28/2013 Time: 4098-1191 PT Time Calculation (min): 29 min  PT Assessment / Plan / Recommendation  History of Present Illness Pt. underwent L5-S1 PLIF for back pain, foraminal stenosis, and R>L LE pain.   PT Comments   Emphasis on education wrap up, practicing bed mobility and stairs.  Pt needs to receive her DME  And will be ready for D/C  Follow Up Recommendations  Home health PT;Supervision/Assistance - 24 hour     Does the patient have the potential to tolerate intense rehabilitation     Barriers to Discharge        Equipment Recommendations  Rolling walker with 5" wheels;3in1 (PT)    Recommendations for Other Services    Frequency Min 5X/week   Progress towards PT Goals Progress towards PT goals: Progressing toward goals  Plan Current plan remains appropriate    Precautions / Restrictions Precautions Precautions: Back Precaution Comments: pt stated 3/3 (although used some different words, described all motions correctly); required cues x 3 during bed mobility to maintain Required Braces or Orthoses: Spinal Brace Spinal Brace: Lumbar corset;Applied in sitting position Restrictions Weight Bearing Restrictions: No   Pertinent Vitals/Pain     Mobility  Bed Mobility Bed Mobility: Sitting - Scoot to Edge of Bed;Sit to Sidelying Right Sitting - Scoot to Edge of Bed: 7: Independent Sit to Sidelying Right: 5: Supervision;HOB flat Scooting to HOB: 5: Supervision Details for Bed Mobility Assistance: vc's to prevent lateral flexion Transfers Transfers: Sit to Stand;Stand to Sit Sit to Stand: 5: Supervision Stand to Sit: 5: Supervision Details for Transfer Assistance: reinforced safety Ambulation/Gait Ambulation/Gait Assistance: 5: Supervision Ambulation Distance (Feet): 150 Feet Assistive device: Rolling walker Ambulation/Gait Assistance Details: steady, but  guarded gait Gait Pattern: Step-through pattern;Decreased stride length Gait velocity: slow Stairs: Yes Stairs Assistance: 4: Min guard Stair Management Technique: One rail Left;Step to pattern;Forwards Number of Stairs: 4 Wheelchair Mobility Wheelchair Mobility: No    Exercises     PT Diagnosis:    PT Problem List:   PT Treatment Interventions:     PT Goals (current goals can now be found in the care plan section) Acute Rehab PT Goals PT Goal Formulation: With patient Time For Goal Achievement: 01/31/13 Potential to Achieve Goals: Good  Visit Information  Last PT Received On: 01/28/13 Assistance Needed: +1 History of Present Illness: Pt. underwent L5-S1 PLIF for back pain, foraminal stenosis, and R>L LE pain.    Subjective Data      Cognition  Cognition Arousal/Alertness: Awake/alert Behavior During Therapy: WFL for tasks assessed/performed Overall Cognitive Status: Within Functional Limits for tasks assessed    Balance     End of Session PT - End of Session Equipment Utilized During Treatment: Back brace Activity Tolerance: Patient tolerated treatment well Patient left: in bed;with call bell/phone within reach Nurse Communication: Mobility status   GP     Shoua Ulloa, Eliseo Gum 01/28/2013, 11:42 AM 01/28/2013  Cantril Bing, PT [phone]  (pager)

## 2013-01-28 NOTE — Discharge Summary (Signed)
Physician Discharge Summary  Patient ID: Morgan Torres MRN: [id number] DOB/AGE: 1950/11/19 62 y.o.  Admit date: 01/23/2013 Discharge date: 01/28/2013  Admission Diagnoses:  Discharge Diagnoses:  Principal Problem:   Spinal stenosis, lumbar region, with neurogenic claudication   Discharged Condition: good  Hospital Course: Patient admitted the hospital where she underwent an L5-S1 decompression and fusion. Postoperative she is doing reasonably well. Preoperative back and leg pain improved. She's had some difficulty with headaches postop. Her wound is healing well. There is no evidence of CSF leakage. The patient's symptoms are tolerable and is ready to go home.  Consults:   Significant Diagnostic Studies:   Treatments:   Discharge Exam: Blood pressure 142/57, pulse 62, temperature 97.4 F (36.3 C), temperature source Oral, resp. rate 20, height 5\' 8"  (1.727 m), weight 102.059 kg (225 lb), SpO2 100.00%. Awake and alert. Oriented and appropriate. Motor and sensory function of the extremities intact. Wound clean and dry. Chest and abdomen benign.  Disposition: 01-Home or Self Care  Discharge Orders   Future Orders Complete By Expires     Face-to-face encounter (required for Medicare/Medicaid patients)  As directed     Comments:      I Morgan Torres A certify that this patient is under my care and that I, or a nurse practitioner or physician's assistant working with me, had a face-to-face encounter that meets the physician face-to-face encounter requirements with this patient on 01/28/2013. The encounter with the patient was in whole, or in part for the following medical condition(s) which is the primary reason for home health care (List medical condition): Lumbar stenosis with neurogenic claudication    Questions:      The encounter with the patient was in whole, or in part, for the following medical condition, which is the primary reason for home health care:  Lumbar stenosis with  neurogenic claudication    I certify that, based on my findings, the following services are medically necessary home health services:  Physical therapy    My clinical findings support the need for the above services:  Unable to leave home safely without assistance and/or assistive device    Further, I certify that my clinical findings support that this patient is homebound due to:  Pain interferes with ambulation/mobility    Reason for Medically Necessary Home Health Services:  Therapy- Investment banker, operational, Patent examiner    Home Health  As directed     Questions:      To provide the following care/treatments:  PT    OT        Medication List    STOP taking these medications       oxyCODONE-acetaminophen 10-325 MG per tablet  Commonly known as:  PERCOCET      TAKE these medications       albuterol 108 (90 BASE) MCG/ACT inhaler  Commonly known as:  PROVENTIL HFA;VENTOLIN HFA  Inhale 1-2 puffs into the lungs every 6 (six) hours as needed for wheezing or shortness of breath.     butalbital-acetaminophen-caffeine 50-325-40 MG per tablet  Commonly known as:  FIORICET, ESGIC  Take 1-2 tablets by mouth every 4 (four) hours as needed for headache.     diazepam 5 MG tablet  Commonly known as:  VALIUM  Take 1-2 tablets (5-10 mg total) by mouth every 6 (six) hours as needed.     EPINEPHrine 0.3 mg/0.3 mL Soaj injection  Commonly known as:  EPIPEN 2-PAK  Inject 0.3 mLs (0.3 mg total) into  the muscle once as needed (for severe allergic reaction). CAll 911 immediately if you have to use this medicine     gabapentin 100 MG capsule  Commonly known as:  NEURONTIN  Take 100 mg by mouth at bedtime.     LORazepam 1 MG tablet  Commonly known as:  ATIVAN  Take 1 mg by mouth daily as needed for anxiety.     losartan-hydrochlorothiazide 100-12.5 MG per tablet  Commonly known as:  HYZAAR  Take 1 tablet by mouth daily.     oxyCODONE 15 MG immediate release tablet  Commonly  known as:  ROXICODONE  Take 1-2 tablets (15-30 mg total) by mouth every 4 (four) hours as needed for pain.     traMADol 50 MG tablet  Commonly known as:  ULTRAM  Take 50 mg by mouth every 4 (four) hours as needed for pain.           Follow-up Information   Follow up with Lamel Mccarley A, MD In 2 weeks. (ext 212)    Contact information:   [address] [place] [address] [phone]       Signed: Temple Pacini 01/28/2013, 8:43 AM

## 2013-01-28 NOTE — Progress Notes (Signed)
In an attempt to gather information about who/when is picking up the pt, as she has been discharged since early this am, pt handed RN her phone to talk to her daughter.  Daughter became irate, stating "Don't come at me like that".  RN explained calmly that we were simply trying gather some information, as the pt was not able to give any definitive information about her transportation.  Pt daughter requested to talk to her mother.  Report given to pm RN.

## 2013-03-12 ENCOUNTER — Ambulatory Visit (HOSPITAL_COMMUNITY)
Admission: RE | Admit: 2013-03-12 | Discharge: 2013-03-12 | Disposition: A | Discharge status: Home / Self Care | Payer: BC Managed Care – PPO | Source: Ambulatory Visit | Attending: Emergency Medicine | Admitting: Emergency Medicine

## 2013-03-12 ENCOUNTER — Encounter (HOSPITAL_COMMUNITY): Payer: Self-pay | Admitting: *Deleted

## 2013-03-12 ENCOUNTER — Emergency Department (HOSPITAL_COMMUNITY)
Admission: EM | Admit: 2013-03-12 | Discharge: 2013-03-12 | Disposition: A | Discharge status: Home / Self Care | Payer: BC Managed Care – PPO | Attending: Emergency Medicine | Admitting: Emergency Medicine

## 2013-03-12 DIAGNOSIS — I10 Essential (primary) hypertension: Secondary | ICD-10-CM | POA: Insufficient documentation

## 2013-03-12 DIAGNOSIS — F329 Major depressive disorder, single episode, unspecified: Secondary | ICD-10-CM | POA: Insufficient documentation

## 2013-03-12 DIAGNOSIS — F411 Generalized anxiety disorder: Secondary | ICD-10-CM | POA: Insufficient documentation

## 2013-03-12 DIAGNOSIS — R0602 Shortness of breath: Secondary | ICD-10-CM

## 2013-03-12 DIAGNOSIS — F3289 Other specified depressive episodes: Secondary | ICD-10-CM | POA: Insufficient documentation

## 2013-03-12 DIAGNOSIS — F17200 Nicotine dependence, unspecified, uncomplicated: Secondary | ICD-10-CM | POA: Insufficient documentation

## 2013-03-12 DIAGNOSIS — R51 Headache: Secondary | ICD-10-CM | POA: Insufficient documentation

## 2013-03-12 DIAGNOSIS — H53149 Visual discomfort, unspecified: Secondary | ICD-10-CM | POA: Insufficient documentation

## 2013-03-12 DIAGNOSIS — Z79899 Other long term (current) drug therapy: Secondary | ICD-10-CM | POA: Insufficient documentation

## 2013-03-12 DIAGNOSIS — R059 Cough, unspecified: Secondary | ICD-10-CM

## 2013-03-12 DIAGNOSIS — R519 Headache, unspecified: Secondary | ICD-10-CM

## 2013-03-12 DIAGNOSIS — J441 Chronic obstructive pulmonary disease with (acute) exacerbation: Secondary | ICD-10-CM | POA: Insufficient documentation

## 2013-03-12 DIAGNOSIS — IMO0001 Reserved for inherently not codable concepts without codable children: Secondary | ICD-10-CM | POA: Insufficient documentation

## 2013-03-12 DIAGNOSIS — M129 Arthropathy, unspecified: Secondary | ICD-10-CM | POA: Insufficient documentation

## 2013-03-12 DIAGNOSIS — R05 Cough: Secondary | ICD-10-CM

## 2013-03-12 DIAGNOSIS — Z8701 Personal history of pneumonia (recurrent): Secondary | ICD-10-CM | POA: Insufficient documentation

## 2013-03-12 DIAGNOSIS — R0789 Other chest pain: Secondary | ICD-10-CM | POA: Insufficient documentation

## 2013-03-12 DIAGNOSIS — J45901 Unspecified asthma with (acute) exacerbation: Secondary | ICD-10-CM | POA: Insufficient documentation

## 2013-03-12 DIAGNOSIS — Z8719 Personal history of other diseases of the digestive system: Secondary | ICD-10-CM | POA: Insufficient documentation

## 2013-03-12 LAB — BASIC METABOLIC PANEL
BUN: 7 mg/dL (ref 6–23)
CO2: 23 mEq/L (ref 19–32)
Chloride: 99 mEq/L (ref 96–112)
Creatinine, Ser: 0.62 mg/dL (ref 0.50–1.10)
Glucose, Bld: 118 mg/dL — ABNORMAL HIGH (ref 70–99)

## 2013-03-12 LAB — CBC WITH DIFFERENTIAL/PLATELET
Basophils Absolute: 0 10*3/uL (ref 0.0–0.1)
Basophils Relative: 0 % (ref 0–1)
Eosinophils Relative: 1 % (ref 0–5)
HCT: 41.7 % (ref 36.0–46.0)
MCH: 32.5 pg (ref 26.0–34.0)
MCHC: 35.3 g/dL (ref 30.0–36.0)
MCV: 92.1 fL (ref 78.0–100.0)
Monocytes Absolute: 0.7 10*3/uL (ref 0.1–1.0)
Monocytes Relative: 8 % (ref 3–12)
RDW: 13.4 % (ref 11.5–15.5)

## 2013-03-12 LAB — POCT I-STAT TROPONIN I

## 2013-03-12 MED ORDER — ALBUTEROL SULFATE (5 MG/ML) 0.5% IN NEBU
5.0000 mg | INHALATION_SOLUTION | Freq: Once | RESPIRATORY_TRACT | Status: AC
  Administered 2013-03-12: 5 mg via RESPIRATORY_TRACT
  Filled 2013-03-12: qty 1

## 2013-03-12 MED ORDER — BUTALBITAL-APAP-CAFFEINE 50-325-40 MG PO TABS: 1.0000 | ORAL_TABLET | Freq: Once | ORAL | Status: DC

## 2013-03-12 MED ORDER — BUTALBITAL-APAP-CAFFEINE 50-325-40 MG PO TABS
1.0000 | ORAL_TABLET | Freq: Once | ORAL | Status: AC
  Administered 2013-03-12: 1 via ORAL

## 2013-03-12 MED ORDER — ALBUTEROL SULFATE HFA 108 (90 BASE) MCG/ACT IN AERS: 1.0000 | INHALATION_SPRAY | Freq: Four times a day (QID) | RESPIRATORY_TRACT | Status: DC | PRN

## 2013-03-12 MED ORDER — IPRATROPIUM BROMIDE 0.02 % IN SOLN
0.5000 mg | Freq: Once | RESPIRATORY_TRACT | Status: AC
  Administered 2013-03-12: 0.5 mg via RESPIRATORY_TRACT
  Filled 2013-03-12: qty 2.5

## 2013-03-12 NOTE — ED Notes (Signed)
RT in to give breathing tx.  Already in progress by RN.

## 2013-03-12 NOTE — ED Provider Notes (Signed)
Medical screening examination/treatment/procedure(s) were performed by non-physician practitioner and as supervising physician I was immediately available for consultation/collaboration.  Shon Baton, MD 03/12/13 [phone]

## 2013-03-12 NOTE — ED Notes (Signed)
Called pharmacy, sending meds with code

## 2013-03-12 NOTE — ED Notes (Signed)
Pt c/o rhinitis, sob, chest pain and body aches since last night.  Feels as if chest pain is r/t cough.   O2 sats of 96.

## 2013-03-12 NOTE — ED Provider Notes (Signed)
CSN: [id number]     Arrival date & time 03/12/13  4098 History   First MD Initiated Contact with Patient 03/12/13 0914     Chief Complaint  Patient presents with  . Shortness of Breath  . Generalized Body Aches   (Consider location/radiation/quality/duration/timing/severity/associated sxs/prior Treatment) The history is provided by the patient and medical records.   Patient with PMH significant for HTN, COPD, arthritis, anxiety, depression, sciatica, fibromyalgia, presents to the ED for nasal congestion, shortness of breath, productive cough, chest tightness, headache and body aches since yesterday evening.  Cough with white/yellow sputum.  Headache associated with photophobia but no visual disturbance, tinnitus, difficulty concentrating, numbness or paresthesias, neck stiffness, or changes in speech.  No recent head injury.  Chest tightness without palpitations, dizziness, or diaphoresis-- pt states she thinks it is due to all the congestion and coughing.  Pt has been feeling very chilled but does not think she was running a fever.  No sick contacts at home.  Pt does have a hx of asthma, currently only using albuterol rescue inhaler.  She does not have a significant cardiac history. Her mother did have CAD.   No abdominal pain, N/V/D.  No recent travel, surgeries, or LE edema.  No prior hx of PE or DVT.  VS stable on arrival.  Past Medical History  Diagnosis Date  . Lumbar herniated disc   . Sciatica   . Hypertension   . Small bowel obstruction   . COPD (chronic obstructive pulmonary disease)   . Arthritis   . Depression   . Anxiety   . Bronchitis     Hx: of  . Pneumonia     Hx: of  . Fibromyalgia    Past Surgical History  Procedure Laterality Date  . Abdominal surgery      resection of small intestine  . Back surgery    . Abdominal hysterectomy    . Tonsillectomy    . Dilation and curettage of uterus    . Colonoscopy      Hx; of  . Hernia repair     Family History  Problem  Relation Age of Onset  . Hypertension Mother   . Diabetes Mother   . Cancer - Other Mother   . Cancer Sister    History  Substance Use Topics  . Smoking status: Current Every Day Smoker -- 1.00 packs/day for 10 years    Types: Cigarettes  . Smokeless tobacco: Never Used  . Alcohol Use: Yes     Comment: occasionally   OB History   Grav Para Term Preterm Abortions TAB SAB Ect Mult Living                 Review of Systems  Constitutional: Positive for chills.  HENT: Positive for congestion.   Eyes: Positive for photophobia.  Respiratory: Positive for cough, chest tightness and shortness of breath.   Neurological: Positive for headaches.  All other systems reviewed and are negative.    Allergies  Diclofenac; Codeine; and Hydrocodone  Home Medications   Current Outpatient Rx  Name  Route  Sig  Dispense  Refill  . albuterol (PROVENTIL HFA;VENTOLIN HFA) 108 (90 BASE) MCG/ACT inhaler   Inhalation   Inhale 1-2 puffs into the lungs every 6 (six) hours as needed for wheezing or shortness of breath.         . butalbital-acetaminophen-caffeine (FIORICET, ESGIC) 50-325-40 MG per tablet   Oral   Take 1-2 tablets by mouth every 4 (four) hours  as needed for headache.   40 tablet   0   . diazepam (VALIUM) 5 MG tablet   Oral   Take 1-2 tablets (5-10 mg total) by mouth every 6 (six) hours as needed.   60 tablet   1   . EPINEPHrine (EPIPEN 2-PAK) 0.3 mg/0.3 mL DEVI   Intramuscular   Inject 0.3 mLs (0.3 mg total) into the muscle once as needed (for severe allergic reaction). CAll 911 immediately if you have to use this medicine   1 Device   1   . gabapentin (NEURONTIN) 100 MG capsule   Oral   Take 100 mg by mouth at bedtime.         Marland Kitchen LORazepam (ATIVAN) 1 MG tablet   Oral   Take 1 mg by mouth daily as needed for anxiety.         Marland Kitchen losartan-hydrochlorothiazide (HYZAAR) 100-12.5 MG per tablet   Oral   Take 1 tablet by mouth daily.         Marland Kitchen oxyCODONE (ROXICODONE)  15 MG immediate release tablet   Oral   Take 1-2 tablets (15-30 mg total) by mouth every 4 (four) hours as needed for pain.   90 tablet   0   . traMADol (ULTRAM) 50 MG tablet   Oral   Take 50 mg by mouth every 4 (four) hours as needed for pain.          BP 164/82  Pulse 87  Temp(Src) 98.1 F (36.7 C) (Oral)  Resp 16  Ht 5\' 8"  (1.727 m)  Wt 223 lb (101.152 kg)  BMI 33.91 kg/m2  SpO2 95%  Physical Exam  Nursing note and vitals reviewed. Constitutional: She is oriented to person, place, and time. She appears well-developed and well-nourished. No distress.  HENT:  Head: Normocephalic and atraumatic.  Mouth/Throat: Oropharynx is clear and moist.  Eyes: Conjunctivae and EOM are normal. Pupils are equal, round, and reactive to light.  Neck: Normal range of motion and full passive range of motion without pain. Neck supple. No rigidity.  No meningeal signs  Cardiovascular: Normal rate, regular rhythm and normal heart sounds.   Pulmonary/Chest: Effort normal. She has wheezes. She has rhonchi.  Diffuse wheezes with scattered rhonchi  Abdominal: Soft. Bowel sounds are normal.  Musculoskeletal: Normal range of motion. She exhibits no edema.  Neurological: She is alert and oriented to person, place, and time. She has normal strength. She displays no tremor. No cranial nerve deficit or sensory deficit. She displays no seizure activity. Gait normal.  No focal neuro deficits or facial droop appreciated  Skin: Skin is warm and dry. She is not diaphoretic.  Psychiatric: She has a normal mood and affect.  Pt tearful    ED Course  Procedures (including critical care time)   Date: 03/12/2013  Rate: 89  Rhythm: normal sinus rhythm  QRS Axis: normal  Intervals: normal  ST/T Wave abnormalities: normal  Conduction Disutrbances:none  Narrative Interpretation:   Old EKG Reviewed: unchanged  Labs Review Labs Reviewed  BASIC METABOLIC PANEL - Abnormal; Notable for the following:     Glucose, Bld 118 (*)    All other components within normal limits  CBC WITH DIFFERENTIAL  POCT I-STAT TROPONIN I   Imaging Review Dg Chest 2 View (if Patient Has Fever And/or Copd)  03/12/2013   CLINICAL DATA:  Cough, body aches  EXAM: CHEST  2 VIEW  COMPARISON:  05/11/2012  FINDINGS: Cardiomediastinal silhouette is stable. Mild hyperinflation again noted.  No acute infiltrate or pulmonary edema. Minimal central bronchitic changes. Stable mild degenerative changes thoracic spine.  IMPRESSION: No acute infiltrate or pulmonary edema. Mild hyperinflation. Minimal central bronchitic changes.   Electronically Signed   By: Natasha Mead   On: 03/12/2013 10:23    MDM   1. Shortness of breath   2. Cough   3. Headache     Headache without focal neuro deficits-- doubt ICH, SAH, TIA, stroke, meningitis, meds given.  EKG NSR, no acute ischemic changes.  Trop negative.  Labs as above.  Albuterol/atrovent neb given with good improvement of wheezing.  CXR as above-- minimal bronchitis changes.  Doubt ACS, PE, dissection, or other vascular collapse at this time-- sx likely related to bronchitis vs viral URI.  12:01 PM Pt re-evaluated, sleeping in room, NAD.  States headache has improved and she feels she can rest comfortably at home.  Overall pt is non-toxic appearing, feel she can be safely d/c with OP FU.  She has fioricet at home that she will take for breakthrough headaches.  She will FU with Dr. Jordan Likes if problems with headaches occur, otherwise FU with PCP.  Rx albuterol.  Discussed plan with pt, she agreed.  Return precautions advised.  Discussed with Dr. Wilkie Aye who agrees with assessment and plan of care.  Garlon Hatchet, PA-C 03/12/13 1406

## 2013-03-29 ENCOUNTER — Emergency Department (HOSPITAL_COMMUNITY): Payer: BC Managed Care – PPO

## 2013-03-29 ENCOUNTER — Encounter (HOSPITAL_COMMUNITY): Payer: Self-pay | Admitting: Nurse Practitioner

## 2013-03-29 ENCOUNTER — Inpatient Hospital Stay (HOSPITAL_COMMUNITY)
Admission: EM | Admit: 2013-03-29 | Discharge: 2013-04-01 | DRG: 122 | Disposition: A | Discharge status: Home / Self Care | Payer: BC Managed Care – PPO | Attending: Internal Medicine | Admitting: Internal Medicine

## 2013-03-29 DIAGNOSIS — I214 Non-ST elevation (NSTEMI) myocardial infarction: Principal | ICD-10-CM | POA: Diagnosis present

## 2013-03-29 DIAGNOSIS — F329 Major depressive disorder, single episode, unspecified: Secondary | ICD-10-CM | POA: Diagnosis present

## 2013-03-29 DIAGNOSIS — E669 Obesity, unspecified: Secondary | ICD-10-CM | POA: Diagnosis present

## 2013-03-29 DIAGNOSIS — J449 Chronic obstructive pulmonary disease, unspecified: Secondary | ICD-10-CM | POA: Diagnosis present

## 2013-03-29 DIAGNOSIS — M48062 Spinal stenosis, lumbar region with neurogenic claudication: Secondary | ICD-10-CM | POA: Diagnosis present

## 2013-03-29 DIAGNOSIS — I2510 Atherosclerotic heart disease of native coronary artery without angina pectoris: Secondary | ICD-10-CM | POA: Diagnosis present

## 2013-03-29 DIAGNOSIS — I10 Essential (primary) hypertension: Secondary | ICD-10-CM | POA: Diagnosis present

## 2013-03-29 DIAGNOSIS — IMO0001 Reserved for inherently not codable concepts without codable children: Secondary | ICD-10-CM | POA: Diagnosis present

## 2013-03-29 DIAGNOSIS — E785 Hyperlipidemia, unspecified: Secondary | ICD-10-CM | POA: Diagnosis present

## 2013-03-29 DIAGNOSIS — I428 Other cardiomyopathies: Secondary | ICD-10-CM | POA: Diagnosis present

## 2013-03-29 DIAGNOSIS — J4489 Other specified chronic obstructive pulmonary disease: Secondary | ICD-10-CM | POA: Diagnosis present

## 2013-03-29 DIAGNOSIS — F17200 Nicotine dependence, unspecified, uncomplicated: Secondary | ICD-10-CM | POA: Diagnosis present

## 2013-03-29 DIAGNOSIS — Z6832 Body mass index (BMI) 32.0-32.9, adult: Secondary | ICD-10-CM

## 2013-03-29 DIAGNOSIS — I5181 Takotsubo syndrome: Secondary | ICD-10-CM | POA: Diagnosis present

## 2013-03-29 DIAGNOSIS — Z720 Tobacco use: Secondary | ICD-10-CM

## 2013-03-29 DIAGNOSIS — F411 Generalized anxiety disorder: Secondary | ICD-10-CM | POA: Diagnosis present

## 2013-03-29 DIAGNOSIS — F3289 Other specified depressive episodes: Secondary | ICD-10-CM | POA: Diagnosis present

## 2013-03-29 LAB — POCT I-STAT TROPONIN I

## 2013-03-29 LAB — TROPONIN I: Troponin I: 1.4 ng/mL (ref <0.30)

## 2013-03-29 LAB — CBC
HCT: 43 % (ref 36.0–46.0)
MCV: 93.1 fL (ref 78.0–100.0)
RBC: 4.62 MIL/uL (ref 3.87–5.11)
WBC: 8.5 10*3/uL (ref 4.0–10.5)

## 2013-03-29 LAB — BASIC METABOLIC PANEL
CO2: 27 mEq/L (ref 19–32)
Chloride: 99 mEq/L (ref 96–112)
Creatinine, Ser: 0.76 mg/dL (ref 0.50–1.10)
Glucose, Bld: 102 mg/dL — ABNORMAL HIGH (ref 70–99)
Sodium: 139 mEq/L (ref 135–145)

## 2013-03-29 LAB — PROTIME-INR
INR: 1.04 (ref 0.00–1.49)
Prothrombin Time: 13.4 seconds (ref 11.6–15.2)

## 2013-03-29 MED ORDER — HEPARIN BOLUS VIA INFUSION
4000.0000 [IU] | Freq: Once | INTRAVENOUS | Status: AC
  Administered 2013-03-29: 4000 [IU] via INTRAVENOUS
  Filled 2013-03-29: qty 4000

## 2013-03-29 MED ORDER — ONDANSETRON HCL 4 MG/2ML IJ SOLN: 4.0000 mg | Freq: Four times a day (QID) | INTRAMUSCULAR | Status: DC | PRN

## 2013-03-29 MED ORDER — NITROGLYCERIN 0.4 MG SL SUBL
0.4000 mg | SUBLINGUAL_TABLET | SUBLINGUAL | Status: AC | PRN
  Administered 2013-03-29 (×3): 0.4 mg via SUBLINGUAL
  Filled 2013-03-29: qty 25

## 2013-03-29 MED ORDER — NITROGLYCERIN IN D5W 200-5 MCG/ML-% IV SOLN
2.0000 ug/min | INTRAVENOUS | Status: DC
  Administered 2013-03-29: 5 ug/min via INTRAVENOUS
  Filled 2013-03-29: qty 250

## 2013-03-29 MED ORDER — NITROGLYCERIN 2 % TD OINT: 1.0000 [in_us] | TOPICAL_OINTMENT | Freq: Once | TRANSDERMAL | Status: DC

## 2013-03-29 MED ORDER — GABAPENTIN 100 MG PO CAPS
100.0000 mg | ORAL_CAPSULE | Freq: Every day | ORAL | Status: DC
  Administered 2013-03-29 – 2013-03-31 (×3): 100 mg via ORAL
  Filled 2013-03-29 (×4): qty 1

## 2013-03-29 MED ORDER — GI COCKTAIL ~~LOC~~
30.0000 mL | Freq: Once | ORAL | Status: AC
  Administered 2013-03-29: 30 mL via ORAL
  Filled 2013-03-29: qty 30

## 2013-03-29 MED ORDER — ASPIRIN 81 MG PO CHEW
81.0000 mg | CHEWABLE_TABLET | ORAL | Status: AC
  Administered 2013-03-30: 81 mg via ORAL
  Filled 2013-03-29: qty 1

## 2013-03-29 MED ORDER — OXYCODONE HCL 5 MG PO TABS
15.0000 mg | ORAL_TABLET | ORAL | Status: DC | PRN
  Administered 2013-03-30 (×2): 30 mg via ORAL
  Filled 2013-03-29 (×2): qty 6

## 2013-03-29 MED ORDER — ASPIRIN 325 MG PO TABS
325.0000 mg | ORAL_TABLET | Freq: Once | ORAL | Status: AC
  Administered 2013-03-29: 325 mg via ORAL
  Filled 2013-03-29: qty 1

## 2013-03-29 MED ORDER — HEPARIN (PORCINE) IN NACL 100-0.45 UNIT/ML-% IJ SOLN: 1050.0000 [IU]/h | Freq: Once | INTRAMUSCULAR | Status: DC

## 2013-03-29 MED ORDER — ALBUTEROL SULFATE HFA 108 (90 BASE) MCG/ACT IN AERS
1.0000 | INHALATION_SPRAY | Freq: Four times a day (QID) | RESPIRATORY_TRACT | Status: DC | PRN
  Filled 2013-03-29: qty 6.7

## 2013-03-29 MED ORDER — METOPROLOL TARTRATE 12.5 MG HALF TABLET
12.5000 mg | ORAL_TABLET | Freq: Two times a day (BID) | ORAL | Status: DC
  Administered 2013-03-29 – 2013-04-01 (×5): 12.5 mg via ORAL
  Filled 2013-03-29 (×7): qty 1

## 2013-03-29 MED ORDER — ASPIRIN EC 81 MG PO TBEC
81.0000 mg | DELAYED_RELEASE_TABLET | Freq: Every day | ORAL | Status: DC
  Administered 2013-03-30 – 2013-04-01 (×3): 81 mg via ORAL
  Filled 2013-03-29 (×3): qty 1

## 2013-03-29 MED ORDER — ACETAMINOPHEN 325 MG PO TABS
650.0000 mg | ORAL_TABLET | ORAL | Status: DC | PRN
  Administered 2013-03-29 – 2013-04-01 (×3): 650 mg via ORAL
  Filled 2013-03-29 (×3): qty 2

## 2013-03-29 MED ORDER — HEPARIN (PORCINE) IN NACL 100-0.45 UNIT/ML-% IJ SOLN
1050.0000 [IU]/h | Freq: Once | INTRAMUSCULAR | Status: AC
  Administered 2013-03-29: 1050 [IU]/h via INTRAVENOUS
  Filled 2013-03-29: qty 250

## 2013-03-29 MED ORDER — LORAZEPAM 1 MG PO TABS
1.0000 mg | ORAL_TABLET | Freq: Every day | ORAL | Status: DC | PRN
  Administered 2013-04-01: 1 mg via ORAL
  Filled 2013-03-29: qty 1

## 2013-03-29 MED ORDER — SODIUM CHLORIDE 0.9 % IJ SOLN: 3.0000 mL | INTRAMUSCULAR | Status: DC | PRN

## 2013-03-29 MED ORDER — SODIUM CHLORIDE 0.9 % IV SOLN: 250.0000 mL | INTRAVENOUS | Status: DC | PRN

## 2013-03-29 MED ORDER — HYDROCHLOROTHIAZIDE 12.5 MG PO CAPS
12.5000 mg | ORAL_CAPSULE | Freq: Every day | ORAL | Status: DC
  Administered 2013-03-30 – 2013-04-01 (×3): 12.5 mg via ORAL
  Filled 2013-03-29 (×3): qty 1

## 2013-03-29 MED ORDER — ATORVASTATIN CALCIUM 40 MG PO TABS
40.0000 mg | ORAL_TABLET | Freq: Every day | ORAL | Status: DC
  Administered 2013-03-30 – 2013-03-31 (×2): 40 mg via ORAL
  Filled 2013-03-29 (×4): qty 1

## 2013-03-29 MED ORDER — SODIUM CHLORIDE 0.9 % IJ SOLN: 3.0000 mL | Freq: Two times a day (BID) | INTRAMUSCULAR | Status: DC

## 2013-03-29 MED ORDER — SODIUM CHLORIDE 0.9 % IV SOLN
1.0000 mL/kg/h | INTRAVENOUS | Status: DC
  Administered 2013-03-30: 1 mL/kg/h via INTRAVENOUS

## 2013-03-29 MED ORDER — LOSARTAN POTASSIUM 50 MG PO TABS
100.0000 mg | ORAL_TABLET | Freq: Every day | ORAL | Status: DC
  Administered 2013-03-30 – 2013-04-01 (×3): 100 mg via ORAL
  Filled 2013-03-29 (×3): qty 2

## 2013-03-29 MED ORDER — LOSARTAN POTASSIUM-HCTZ 100-12.5 MG PO TABS: 1.0000 | ORAL_TABLET | Freq: Every day | ORAL | Status: DC

## 2013-03-29 MED ORDER — BUTALBITAL-APAP-CAFFEINE 50-325-40 MG PO TABS: 1.0000 | ORAL_TABLET | ORAL | Status: DC | PRN

## 2013-03-29 MED ORDER — NITROGLYCERIN 0.4 MG SL SUBL
0.4000 mg | SUBLINGUAL_TABLET | SUBLINGUAL | Status: DC | PRN
  Administered 2013-03-31 (×2): 0.4 mg via SUBLINGUAL

## 2013-03-29 NOTE — H&P (Signed)
Pt. Seen and examined. Agree with the NP/PA-C note as written. Pleasant 62 yo AAF with multiple coronary risk factors and crescendo anginal symptoms with elevated troponin consistent with NSTEMI. She had chest pain today with associated diaphoresis and nausea in church.  This was relieved somewhat with nitroglycerin. EKG shows NSR without ischemic changes or ST elevation.    IMPRESSION: 1.  NSTEMI (acute coronary syndrome)  PLAN: 1.  Will plan to admit her to the cardiac care unit. 2.  Start heparin and nitroglycerin, continue aspirin. Cycle cardiac enzymes. 3.  Cardiac risk factor modification.  Start atorvastatin 80 mg daily acutely. 3.  Keep n.p.o. after midnight for cardiac catheterization in the a.m.  Chrystie Nose, MD, [facility] Attending Cardiologist [facility]

## 2013-03-29 NOTE — H&P (Signed)
Morgan Torres is an 62 y.o. female.   Chief Complaint: Chest pain HPI:   The patient is a 62 yo obese AA female with a history of HTN, tobacco abuse, ABO, lumbar herniation, anxiety, depression, COPD.  She reports being awaken yesterday morning with CP.  It eventually got better, however, today at church the pain began again with diaphoresis, SOB, nausea, LEE.  It felt like a "fullness" and at the worst was 6/10.  The patient currently denies  vomiting, fever,  orthopnea, dizziness, PND, cough, congestion, abdominal pain, hematochezia, melena.  Troponin is positive at 0.60.  TWI in aVL  Past Medical History  Diagnosis Date  . Lumbar herniated disc   . Sciatica   . Hypertension   . Small bowel obstruction   . COPD (chronic obstructive pulmonary disease)   . Arthritis   . Depression   . Anxiety   . Bronchitis     Hx: of  . Pneumonia     Hx: of  . Fibromyalgia     Past Surgical History  Procedure Laterality Date  . Abdominal surgery      resection of small intestine  . Back surgery    . Abdominal hysterectomy    . Tonsillectomy    . Dilation and curettage of uterus    . Colonoscopy      Hx; of  . Hernia repair      Family History  Problem Relation Age of Onset  . Hypertension Mother   . Diabetes Mother   . Cancer - Other Mother   . Cancer Sister    Social History:  reports that she has been smoking Cigarettes.  She has a 10 pack-year smoking history. She has never used smokeless tobacco. She reports that she does not drink alcohol or use illicit drugs.  Allergies:  Allergies  Allergen Reactions  . Diclofenac Anaphylaxis  . Codeine Itching  . Hydrocodone Itching     (Not in a hospital admission)  Results for orders placed during the hospital encounter of 03/29/13 (from the past 48 hour(s))  BASIC METABOLIC PANEL     Status: Abnormal   Collection Time    03/29/13  4:07 PM      Result Value Range   Sodium 139  135 - 145 mEq/L   Potassium 3.9  3.5 - 5.1 mEq/L    Chloride 99  96 - 112 mEq/L   CO2 27  19 - 32 mEq/L   Glucose, Bld 102 (*) 70 - 99 mg/dL   BUN 9  6 - 23 mg/dL   Creatinine, Ser 6.96  0.50 - 1.10 mg/dL   Calcium 9.2  8.4 - 29.5 mg/dL   GFR calc non Af Amer 88 (*) >90 mL/min   GFR calc Af Amer >90  >90 mL/min   Comment: (NOTE)     The eGFR has been calculated using the CKD EPI equation.     This calculation has not been validated in all clinical situations.     eGFR's persistently <90 mL/min signify possible Chronic Kidney     Disease.  CBC     Status: None   Collection Time    03/29/13  4:07 PM      Result Value Range   WBC 8.5  4.0 - 10.5 K/uL   RBC 4.62  3.87 - 5.11 MIL/uL   Hemoglobin 15.0  12.0 - 15.0 g/dL   HCT 28.4  13.2 - 44.0 %   MCV 93.1  78.0 - 100.0  fL   MCH 32.5  26.0 - 34.0 pg   MCHC 34.9  30.0 - 36.0 g/dL   RDW 81.1  91.4 - 78.2 %   Platelets 296  150 - 400 K/uL  D-DIMER, QUANTITATIVE     Status: Abnormal   Collection Time    03/29/13  4:07 PM      Result Value Range   D-Dimer, Quant 0.73 (*) 0.00 - 0.48 ug/mL-FEU   Comment:            AT THE INHOUSE ESTABLISHED CUTOFF     VALUE OF 0.48 ug/mL FEU,     THIS ASSAY HAS BEEN DOCUMENTED     IN THE LITERATURE TO HAVE     A SENSITIVITY AND NEGATIVE     PREDICTIVE VALUE OF AT LEAST     98 TO 99%.  THE TEST RESULT     SHOULD BE CORRELATED WITH     AN ASSESSMENT OF THE CLINICAL     PROBABILITY OF DVT / VTE.  APTT     Status: None   Collection Time    03/29/13  4:07 PM      Result Value Range   aPTT 29  24 - 37 seconds  PROTIME-INR     Status: None   Collection Time    03/29/13  4:07 PM      Result Value Range   Prothrombin Time 13.4  11.6 - 15.2 seconds   INR 1.04  0.00 - 1.49  POCT I-STAT TROPONIN I     Status: Abnormal   Collection Time    03/29/13  4:19 PM      Result Value Range   Troponin i, poc 0.60 (*) 0.00 - 0.08 ng/mL   Comment NOTIFIED PHYSICIAN     Comment 3            Comment: Due to the release kinetics of cTnI,     a negative result  within the first hours     of the onset of symptoms does not rule out     myocardial infarction with certainty.     If myocardial infarction is still suspected,     repeat the test at appropriate intervals.   Dg Chest 2 View  03/29/2013   *RADIOLOGY REPORT*  Clinical Data: Cold, CP, history of COPD, bronchitis, smoking, initial encounter.  CHEST - 2 VIEW  Comparison: 03/12/2013; 05/11/2012; 05/05/2010  Findings:  Grossly unchanged cardiac silhouette and mediastinal contours with atherosclerotic calcifications within the thoracic aorta.  The lungs appear hyperexpanded with flattening of the bilateral hemidiaphragms and mild diffuse slightly nodular thickening of the pulmonary interstitium.  There is minimal pleural parenchymal thickening within the right minor fissure.  No focal airspace opacities.  No pleural effusion or pneumothorax.  No evidence of edema.  Grossly unchanged bones including mild (< 25%) compression deformity of a mid thoracic vertebral body and associated mild degenerative change within the mid thoracic spine.  IMPRESSION: Mild lung hyperexpansion and bronchitic change without acute cardiopulmonary disease.   Original Report Authenticated By: Tacey Ruiz, MD    Review of Systems  Constitutional: Positive for diaphoresis. Negative for fever.  HENT: Negative for congestion and sore throat.   Respiratory: Positive for shortness of breath. Negative for cough.   Cardiovascular: Positive for chest pain and leg swelling. Negative for orthopnea.  Gastrointestinal: Positive for nausea. Negative for vomiting, abdominal pain, blood in stool and melena.  Genitourinary: Negative for hematuria.  Neurological: Negative for dizziness.  All other systems reviewed and are negative.    Blood pressure 125/85, pulse 77, temperature 98.8 F (37.1 C), temperature source Oral, resp. rate 18, height 5\' 8"  (1.727 m), weight 220 lb (99.791 kg), SpO2 96.00%. Physical Exam  Constitutional: She is  oriented to person, place, and time. She appears well-developed. No distress.  Obese  HENT:  Head: Normocephalic and atraumatic.  Mouth/Throat: No oropharyngeal exudate.  Eyes: EOM are normal. Pupils are equal, round, and reactive to light. No scleral icterus.  Neck: Neck supple. No JVD present.  Cardiovascular: Normal rate, regular rhythm, S1 normal and S2 normal.   No murmur heard. Pulses:      Radial pulses are 2+ on the right side, and 2+ on the left side.       Dorsalis pedis pulses are 2+ on the right side, and 2+ on the left side.  Respiratory: Effort normal and breath sounds normal. She has no wheezes. She has no rales.  GI: Soft. Bowel sounds are normal. She exhibits no distension. There is no tenderness.  Musculoskeletal: She exhibits no edema.  Lymphadenopathy:    She has no cervical adenopathy.  Neurological: She is alert and oriented to person, place, and time. She exhibits normal muscle tone.  Skin: Skin is warm and dry.  Psychiatric: She has a normal mood and affect.     Assessment/Plan Principal Problem:   NSTEMI (non-ST elevated myocardial infarction) Active Problems:   HYPERLIPIDEMIA   HYPERTENSION  Plan:  Admit to stepdown.  Start IV heparin and NTG.  Cycle troponin, check lipids, TSH, A1C.  Left heart cath tomorrow.   Fraidy Mccarrick 03/29/2013, 5:16 PM

## 2013-03-29 NOTE — ED Provider Notes (Signed)
CSN: [id number]     Arrival date & time 03/29/13  1409 History   First MD Initiated Contact with Patient 03/29/13 1533     Chief Complaint  Patient presents with  . Chest Pain   (Consider location/radiation/quality/duration/timing/severity/associated sxs/prior Treatment) Patient is a 62 y.o. female presenting with chest pain. The history is provided by the patient.  Chest Pain Pain location:  Substernal area Pain quality: aching   Radiates to: throughout entire chest. Pain radiates to the back: no   Pain severity:  Moderate Onset quality:  Sudden Timing:  Constant (today constant, had another episode yesterday that was constant) Progression:  Worsening Chronicity:  New Context: at rest   Context: not breathing, no stress and no trauma   Relieved by:  Nothing Worsened by:  Nothing tried Ineffective treatments:  None tried Associated symptoms: cough, fatigue, nausea (mild) and shortness of breath (mild)   Associated symptoms: no abdominal pain, no back pain, no fever and not vomiting     Past Medical History  Diagnosis Date  . Lumbar herniated disc   . Sciatica   . Hypertension   . Small bowel obstruction   . COPD (chronic obstructive pulmonary disease)   . Arthritis   . Depression   . Anxiety   . Bronchitis     Hx: of  . Pneumonia     Hx: of  . Fibromyalgia    Past Surgical History  Procedure Laterality Date  . Abdominal surgery      resection of small intestine  . Back surgery    . Abdominal hysterectomy    . Tonsillectomy    . Dilation and curettage of uterus    . Colonoscopy      Hx; of  . Hernia repair     Family History  Problem Relation Age of Onset  . Hypertension Mother   . Diabetes Mother   . Cancer - Other Mother   . Cancer Sister    History  Substance Use Topics  . Smoking status: Current Every Day Smoker -- 1.00 packs/day for 10 years    Types: Cigarettes  . Smokeless tobacco: Never Used  . Alcohol Use: No   OB History   Grav Para Term  Preterm Abortions TAB SAB Ect Mult Living                 Review of Systems  Constitutional: Positive for fatigue. Negative for fever.  Respiratory: Positive for cough and shortness of breath (mild).   Cardiovascular: Positive for chest pain. Negative for leg swelling.  Gastrointestinal: Positive for nausea (mild). Negative for vomiting and abdominal pain.  Musculoskeletal: Negative for back pain.  All other systems reviewed and are negative.    Allergies  Diclofenac; Codeine; and Hydrocodone  Home Medications   Current Outpatient Rx  Name  Route  Sig  Dispense  Refill  . albuterol (PROVENTIL HFA;VENTOLIN HFA) 108 (90 BASE) MCG/ACT inhaler   Inhalation   Inhale 1-2 puffs into the lungs every 6 (six) hours as needed for wheezing or shortness of breath.         Marland Kitchen albuterol (PROVENTIL HFA;VENTOLIN HFA) 108 (90 BASE) MCG/ACT inhaler   Inhalation   Inhale 1-2 puffs into the lungs every 6 (six) hours as needed for wheezing.   1 Inhaler   0   . butalbital-acetaminophen-caffeine (FIORICET, ESGIC) 50-325-40 MG per tablet   Oral   Take 1-2 tablets by mouth every 4 (four) hours as needed for headache.   40  tablet   0   . diazepam (VALIUM) 5 MG tablet   Oral   Take 5-10 mg by mouth every 6 (six) hours as needed (for pain/anxiety).         Marland Kitchen EPINEPHrine (EPIPEN 2-PAK) 0.3 mg/0.3 mL DEVI   Intramuscular   Inject 0.3 mLs (0.3 mg total) into the muscle once as needed (for severe allergic reaction). CAll 911 immediately if you have to use this medicine   1 Device   1   . gabapentin (NEURONTIN) 100 MG capsule   Oral   Take 100 mg by mouth at bedtime.         Marland Kitchen LORazepam (ATIVAN) 1 MG tablet   Oral   Take 1 mg by mouth daily as needed for anxiety.         Marland Kitchen losartan-hydrochlorothiazide (HYZAAR) 100-12.5 MG per tablet   Oral   Take 1 tablet by mouth daily.         Marland Kitchen oxyCODONE (ROXICODONE) 15 MG immediate release tablet   Oral   Take 1-2 tablets (15-30 mg total) by  mouth every 4 (four) hours as needed for pain.   90 tablet   0   . traMADol (ULTRAM) 50 MG tablet   Oral   Take 50 mg by mouth every 4 (four) hours as needed for pain.          BP 136/84  Pulse 82  Temp(Src) 98.8 F (37.1 C) (Oral)  Resp 24  Ht 5\' 8"  (1.727 m)  Wt 220 lb (99.791 kg)  BMI 33.46 kg/m2  SpO2 98% Physical Exam  Nursing note and vitals reviewed. Constitutional: She is oriented to person, place, and time. She appears well-developed and well-nourished. No distress.  HENT:  Head: Normocephalic and atraumatic.  Eyes: EOM are normal. Pupils are equal, round, and reactive to light.  Neck: Normal range of motion. Neck supple.  Cardiovascular: Normal rate and regular rhythm.  Exam reveals no friction rub.   No murmur heard. Pulmonary/Chest: Effort normal and breath sounds normal. No respiratory distress. She has no wheezes. She has no rales.  Abdominal: Soft. She exhibits no distension. There is no tenderness. There is no rebound.  Musculoskeletal: Normal range of motion. She exhibits no edema.  Neurological: She is alert and oriented to person, place, and time.  Skin: She is not diaphoretic.    ED Course  Procedures (including critical care time) Labs Review Labs Reviewed  BASIC METABOLIC PANEL  CBC   Imaging Review Dg Chest 2 View  03/29/2013   *RADIOLOGY REPORT*  Clinical Data: Cold, CP, history of COPD, bronchitis, smoking, initial encounter.  CHEST - 2 VIEW  Comparison: 03/12/2013; 05/11/2012; 05/05/2010  Findings:  Grossly unchanged cardiac silhouette and mediastinal contours with atherosclerotic calcifications within the thoracic aorta.  The lungs appear hyperexpanded with flattening of the bilateral hemidiaphragms and mild diffuse slightly nodular thickening of the pulmonary interstitium.  There is minimal pleural parenchymal thickening within the right minor fissure.  No focal airspace opacities.  No pleural effusion or pneumothorax.  No evidence of edema.   Grossly unchanged bones including mild (< 25%) compression deformity of a mid thoracic vertebral body and associated mild degenerative change within the mid thoracic spine.  IMPRESSION: Mild lung hyperexpansion and bronchitic change without acute cardiopulmonary disease.   Original Report Authenticated By: Tacey Ruiz, MD     Date: 03/29/2013  Rate: 81  Rhythm: normal sinus rhythm  QRS Axis: normal  Intervals: normal  ST/T Wave  abnormalities: normal  Conduction Disutrbances:none  Narrative Interpretation:   Old EKG Reviewed: unchanged CRITICAL CARE Performed by: Dagmar Hait   Total critical care time: 30 minutes  Critical care time was exclusive of separately billable procedures and treating other patients.  Critical care was necessary to treat or prevent imminent or life-threatening deterioration.  Critical care was time spent personally by me on the following activities: development of treatment plan with patient and/or surrogate as well as nursing, discussions with consultants, evaluation of patient's response to treatment, examination of patient, obtaining history from patient or surrogate, ordering and performing treatments and interventions, ordering and review of laboratory studies, ordering and review of radiographic studies, pulse oximetry and re-evaluation of patient's condition.    MDM   1. NSTEMI (non-ST elevated myocardial infarction)   2. Other and unspecified hyperlipidemia   3. Unspecified essential hypertension   4. Tobacco abuse    26F presents with chest pain. Intermittent, had an episode yesterday morning that awoke her from sleep. States that L. and I felt like leg indigestion yesterday. She states she just wanted to belch to make herself feel better. Resolved on its own. It was constant when she had the pain yesterday.  Another episode today began at rest in church. Constant, states it radiates throughout her chest. Worse with deep breathing. No  radiation to the back. Mild associated nausea and shortness of breath. She states she's been feeling sick for the past 2 weeks and she was seen here. She does have a history of COPD, for which she takes albuterol, and states this is not feel like her normal COPD. On exam, her vitals are stable. She is not tachypnea according to respiratory distress. She has a normal abdominal exam. Lungs are clear. She did have surgery within the past 3 months, lumbar fusion, however was in rehabilitation soon thereafter and not immobilized in the bed for prolonged periods of time. Concern for possible PE with pulmonary nature of the pain. Also concern for possible ACS. She is having pain here rest. Will give GI cocktail. Initial EKG is normal without signs of acute ischemia. Will not give aspirin as she had anaphylactic reaction to diclofenac in the past. I do not feel this is secondary to COPD. 4:33 pm I-stat troponin returned as elevated. I spoke with patient, she can take asp is oral and irin - has had it before. ASA ordered. Heparin ordered. Cards consulted and admitting.     Dagmar Hait, MD 03/29/13 [phone]

## 2013-03-29 NOTE — ED Notes (Signed)
Pt c/o tightness in chest that woke her yesterday, resolved, and then came back while she was at church today. Nothing relieves or increases pain. Denies cardiac history

## 2013-03-29 NOTE — ED Notes (Signed)
Cardiologist at bedside.  

## 2013-03-29 NOTE — Progress Notes (Signed)
ANTICOAGULATION CONSULT NOTE - Initial Consult  Pharmacy Consult for Heparin  Indication: chest pain/ACS  Allergies  Allergen Reactions  . Diclofenac Anaphylaxis  . Codeine Itching  . Hydrocodone Itching    Patient Measurements: Height: 5\' 8"  (172.7 cm) Weight: 220 lb (99.791 kg) IBW/kg (Calculated) : 63.9 Heparin Dosing Weight: 86 kg  Vital Signs: Temp: 98.8 F (37.1 C) (10/05 1539) Temp src: Oral (10/05 1539) BP: 143/85 mmHg (10/05 1545) Pulse Rate: 81 (10/05 1545)  Labs:  Recent Labs  03/29/13 1607  HGB 15.0  HCT 43.0  PLT 296    Estimated Creatinine Clearance: 89 ml/min (by C-G formula based on Cr of 0.62).   Medical History: Past Medical History  Diagnosis Date  . Lumbar herniated disc   . Sciatica   . Hypertension   . Small bowel obstruction   . COPD (chronic obstructive pulmonary disease)   . Arthritis   . Depression   . Anxiety   . Bronchitis     Hx: of  . Pneumonia     Hx: of  . Fibromyalgia     Assessment: 62 y/o F with CP, Trop 0.60, to start heparin. Hgb 15, Plts 296, no bleeding noted, CrCl ~ 80-90.   Goal of Therapy:  Heparin level 0.3-0.7 units/ml Monitor platelets by anticoagulation protocol: Yes   Plan:  -Heparin 4000 units BOLUS x 1 -Start heparin drip at 1050 units/hr -6 hour HL at 2330 -Daily CBC/HL -Monitor for bleeding  Thank you for allowing me to take part in this patient's care,  Abran Duke, PharmD Clinical Pharmacist Phone: [phone] Pager: [phone] 03/29/2013 4:46 PM

## 2013-03-30 ENCOUNTER — Encounter (HOSPITAL_COMMUNITY)
Admission: EM | Disposition: A | Discharge status: Home / Self Care | Payer: Self-pay | Source: Home / Self Care | Attending: Internal Medicine

## 2013-03-30 ENCOUNTER — Inpatient Hospital Stay (HOSPITAL_COMMUNITY): Payer: BC Managed Care – PPO

## 2013-03-30 ENCOUNTER — Encounter (HOSPITAL_COMMUNITY): Payer: Self-pay | Admitting: *Deleted

## 2013-03-30 DIAGNOSIS — I10 Essential (primary) hypertension: Secondary | ICD-10-CM

## 2013-03-30 DIAGNOSIS — I2510 Atherosclerotic heart disease of native coronary artery without angina pectoris: Secondary | ICD-10-CM

## 2013-03-30 DIAGNOSIS — F17200 Nicotine dependence, unspecified, uncomplicated: Secondary | ICD-10-CM

## 2013-03-30 HISTORY — PX: LEFT HEART CATHETERIZATION WITH CORONARY ANGIOGRAM: SHX5451

## 2013-03-30 LAB — BASIC METABOLIC PANEL
CO2: 23 mEq/L (ref 19–32)
Calcium: 8.9 mg/dL (ref 8.4–10.5)
Chloride: 104 mEq/L (ref 96–112)
Creatinine, Ser: 0.67 mg/dL (ref 0.50–1.10)
Glucose, Bld: 105 mg/dL — ABNORMAL HIGH (ref 70–99)
Potassium: 3.9 mEq/L (ref 3.5–5.1)
Sodium: 137 mEq/L (ref 135–145)

## 2013-03-30 LAB — TROPONIN I: Troponin I: 1.43 ng/mL (ref <0.30)

## 2013-03-30 LAB — LIPID PANEL
Cholesterol: 231 mg/dL — ABNORMAL HIGH (ref 0–200)
HDL: 51 mg/dL (ref >39)
LDL Cholesterol: 159 mg/dL — ABNORMAL HIGH (ref 0–99)
Triglycerides: 105 mg/dL (ref <150)

## 2013-03-30 LAB — TSH: TSH: 0.604 u[IU]/mL (ref 0.350–4.500)

## 2013-03-30 LAB — CBC
HCT: 40.9 % (ref 36.0–46.0)
MCHC: 34.5 g/dL (ref 30.0–36.0)
RBC: 4.42 MIL/uL (ref 3.87–5.11)
RDW: 13.3 % (ref 11.5–15.5)
WBC: 6.7 10*3/uL (ref 4.0–10.5)

## 2013-03-30 LAB — HEPARIN LEVEL (UNFRACTIONATED): Heparin Unfractionated: 0.18 IU/mL — ABNORMAL LOW (ref 0.30–0.70)

## 2013-03-30 LAB — HEMOGLOBIN A1C
Hgb A1c MFr Bld: 6 % — ABNORMAL HIGH (ref <5.7)
Mean Plasma Glucose: 126 mg/dL — ABNORMAL HIGH (ref <117)

## 2013-03-30 SURGERY — LEFT HEART CATHETERIZATION WITH CORONARY ANGIOGRAM
Anesthesia: LOCAL

## 2013-03-30 MED ORDER — FENTANYL CITRATE 0.05 MG/ML IJ SOLN
INTRAMUSCULAR | Status: AC
  Filled 2013-03-30: qty 2

## 2013-03-30 MED ORDER — NITROGLYCERIN 0.2 MG/ML ON CALL CATH LAB
INTRAVENOUS | Status: AC
  Filled 2013-03-30: qty 1

## 2013-03-30 MED ORDER — AMLODIPINE BESYLATE 2.5 MG PO TABS
2.5000 mg | ORAL_TABLET | Freq: Every day | ORAL | Status: DC
  Administered 2013-03-30 – 2013-03-31 (×2): 2.5 mg via ORAL
  Filled 2013-03-30 (×2): qty 1

## 2013-03-30 MED ORDER — MIDAZOLAM HCL 2 MG/2ML IJ SOLN
INTRAMUSCULAR | Status: AC
  Filled 2013-03-30: qty 2

## 2013-03-30 MED ORDER — SODIUM CHLORIDE 0.9 % IV SOLN
1.0000 mL/kg/h | INTRAVENOUS | Status: AC
  Administered 2013-03-30: 1 mL/kg/h via INTRAVENOUS

## 2013-03-30 MED ORDER — SODIUM CHLORIDE 0.9 % IJ SOLN
3.0000 mL | Freq: Two times a day (BID) | INTRAMUSCULAR | Status: DC
  Administered 2013-03-31 – 2013-04-01 (×3): 3 mL via INTRAVENOUS

## 2013-03-30 MED ORDER — HEPARIN (PORCINE) IN NACL 2-0.9 UNIT/ML-% IJ SOLN
INTRAMUSCULAR | Status: AC
  Filled 2013-03-30: qty 1000

## 2013-03-30 MED ORDER — IOHEXOL 350 MG/ML SOLN
100.0000 mL | Freq: Once | INTRAVENOUS | Status: AC | PRN
  Administered 2013-03-30: 100 mL via INTRAVENOUS

## 2013-03-30 MED ORDER — HEPARIN (PORCINE) IN NACL 100-0.45 UNIT/ML-% IJ SOLN
1500.0000 [IU]/h | INTRAMUSCULAR | Status: DC
  Administered 2013-03-30: 1500 [IU]/h via INTRAVENOUS
  Filled 2013-03-30 (×3): qty 250

## 2013-03-30 MED ORDER — HEPARIN BOLUS VIA INFUSION
2000.0000 [IU] | Freq: Once | INTRAVENOUS | Status: AC
  Administered 2013-03-30: 2000 [IU] via INTRAVENOUS
  Filled 2013-03-30: qty 2000

## 2013-03-30 MED ORDER — LIDOCAINE HCL (PF) 1 % IJ SOLN
INTRAMUSCULAR | Status: AC
Start: 2013-03-30 — End: 2013-03-30
  Filled 2013-03-30: qty 30

## 2013-03-30 MED ORDER — SODIUM CHLORIDE 0.9 % IV SOLN: 250.0000 mL | INTRAVENOUS | Status: DC | PRN

## 2013-03-30 MED ORDER — SODIUM CHLORIDE 0.9 % IJ SOLN: 3.0000 mL | INTRAMUSCULAR | Status: DC | PRN

## 2013-03-30 MED ORDER — HEPARIN (PORCINE) IN NACL 100-0.45 UNIT/ML-% IJ SOLN
1500.0000 [IU]/h | INTRAMUSCULAR | Status: DC
  Filled 2013-03-30: qty 250

## 2013-03-30 NOTE — CV Procedure (Signed)
CARDIAC CATHETERIZATION REPORT  NAME:  Morgan Torres   MRN: [id number] DOB:  Nov 06, 1950   ADMIT DATE: 03/29/2013 Procedure Date: 03/30/2013  INTERVENTIONAL CARDIOLOGIST: Marykay Lex, M.D., MS PRIMARY CARE PROVIDER: Evlyn Courier, MD PRIMARY CARDIOLOGIST: Hilty, Kenneth C. ([place]), MD  PATIENT:  Morgan Torres is a 62 y.o. female with multiple Cardiac Risk Factors who presented with SSx c/w ACS & ruled in for NSTEMI.   She was referred for invasive cardiac evaluation via cardiac catheterization.  PRE-OPERATIVE DIAGNOSIS:    NSTEMI  PROCEDURES PERFORMED:    LEFT HEART CATHETERIZATION WITH NATIVE CORONARY ANGIOGRAPHY  PROCEDURE:Consent:  Risks of procedure as well as the alternatives and risks of each were explained to the (patient/caregiver).  Consent for procedure obtained. Consent for signed by MD and patient with RN witness -- placed on chart.   PROCEDURE: The patient was brought to the 2nd Floor [facility] in the fasting state and prepped and draped in the usual sterile fashion for Right groin or radial access. A modified Allen's test with plethysmography was performed, revealing excellent Ulnar artery collateral flow.  Sterile technique was used including antiseptics, cap, gloves, gown, hand hygiene, mask and sheet.  Skin prep: Chlorhexidine.  Time Out: Verified patient identification, verified procedure, site/side was marked, verified correct patient position, special equipment/implants available, medications/allergies/relevent history reviewed, required imaging and test results available.  Performed  Access: RIGHT RADIAL Artery; 6 Fr Sheath -- Seldinger technique (Angiocath Micropuncture Kit)  Radial Cocktail -IA 10 ml, IV Heparin - 5000 Units Diagnostic:  TIG 4.0, (JL 3.5, JR 4, Angled Pigtail)  Left & Right Coronary Artery Angiography: TIG 4.0  LV Hemodynamics (LV Gram): Angled PigTail   TR Band:  1422 Hours, 13 mL  air  MEDICATIONS:  Anesthesia:  Local Lidocaine 2 ml  Sedation:  3 mg IV Versed, 75 mcg IV fentanyl ;   Premedication: 2 mg PO Valium  Omnipaque Contrast: 60 ml  Anticoagulation:  IV Heparin 5000 Units   Anti-Platelet Agent:  ASA  250 ml NS Bolus  Hemodynamics:  Central Aortic / Mean Pressures: 105/2 mmHg; 8 mmHg  Left Ventricular Pressures / EDP: 100/64 mmHg; 76 mmHg  Left Ventriculography:  EF: ~45 %  Wall Motion: Basal segments are hyperdynamic with mid chamber Anterior & Inferior Hypokinesis, but apical WM is normal  Coronary Anatomy:  Left Main: large caliber, bifurcates into LAD & Circumflex; mildly calcified, ~ distal ~10-20%. LAD: large caliber vessel that wraps the infero-apex, tapering distally in a normal fashion.  1 major mid vessel Diagonal branch. Diffuse, mild ~10-20% stenoses  D1: Moderate caliber vessel that perfuses the anterolateral wall; angiographically normal  Left Circumflex: large caliber, non-dominant vessel that gives off a small AV Groove branch before terminating as a major Large Lateral OM.  The ostium of the AV Groove branch has a ~50% stenosis, but the remainder of the vessel tree is essentially free of disease.   RCA: Large caliber vessel with diffuse ~10-20% lesions in the proximal and mid vessel.  It bifurcates distally into the Right Posterior Descending Artery and the  Right Posterior AV Groove Branch (RPAV).  RPDA: moderate caliber veseel, mild luminal irregularities  RPL Sysytem:The RPAV moderate caliber vessels with 1 major & several small RPL branches.  PATIENT DISPOSITION:    The patient was transferred to the PACU holding area in a hemodynamicaly stable, chest pain free condition.  The patient tolerated the procedure well, and there were no complications.  EBL:   <  5 ml ml  The patient was stable before, during, and after the procedure.  POST-OPERATIVE DIAGNOSIS:    Angiographically only mild to moderate CAD, with no  obvious "Culprit Lesion" for NSTEMI  Abnormal LV Gram findings with WMA suggestive of Takotsubo cardiomyopathy  PLAN OF CARE:  Standard post-radial cath care.  Will restart Heparin & check CTAngio (PE protocol) to assess for PE given mil D-dimer elevation.  Check 2 D Echo for better EF & Wall Motion assessment.  Anticipate that she will be able to be d/c'd tomorrow pending CTA & Echo results.  Add low dose CCB for possible coronary spasm.   Marykay Lex, M.D., M.S. [facility] [address] [place], [place]  [address]  [phone]  03/30/2013 2:37 PM

## 2013-03-30 NOTE — Progress Notes (Signed)
    Subjective:  Intermitted CP overnight - Troponin up to 1.4  Objective:  Vital Signs in the last 24 hours: Temp:  [98.1 F (36.7 C)-99.4 F (37.4 C)] 98.1 F (36.7 C) (10/06 0833) Pulse Rate:  [61-84] 61 (10/06 0833) Resp:  [13-24] 13 (10/06 0000) BP: (105-143)/(54-89) 121/56 mmHg (10/06 0800) SpO2:  [92 %-99 %] 97 % (10/06 0833) Weight:  [216 lb 0.8 oz (98 kg)-220 lb (99.791 kg)] 216 lb 7.9 oz (98.2 kg) (10/06 0500)  Intake/Output from previous day: 10/05 0701 - 10/06 0700 In: 365.2 [I.V.:365.2] Out: 1 [Urine:1] Intake/Output from this shift:   Physical Exam: General appearance: alert, cooperative, appears stated age, no distress and moderately obese Neck: no adenopathy, no carotid bruit and no JVD Lungs: initially coarse BS biliaterally - ? rhonchorous, cleared with cough. Heart: regular rate and rhythm, S1, S2 normal, no murmur, click, rub or gallop and normal apical impulse Abdomen: soft, non-tender; bowel sounds normal; no masses,  no organomegaly Extremities: extremities normal, atraumatic, no cyanosis or edema Pulses: 2+ and symmetric Neurologic: Grossly normal; CN II -XII grossly intact  Lab Results:  Recent Labs  03/29/13 1607  WBC 8.5  HGB 15.0  PLT 296    Recent Labs  03/29/13 1607  NA 139  K 3.9  CL 99  CO2 27  GLUCOSE 102*  BUN 9  CREATININE 0.76    Recent Labs  03/29/13 2005  TROPONINI 1.40*   Hepatic Function Panel No results found for this basename: PROT, ALBUMIN, AST, ALT, ALKPHOS, BILITOT, BILIDIR, IBILI,  in the last 72 hours No results found for this basename: CHOL,  in the last 72 hours No results found for this basename: PROTIME,  in the last 72 hours  Imaging:  Imaging results have been reviewed  Cardiac Studies:  Assessment/Plan:  Principal Problem:   NSTEMI (non-ST elevated myocardial infarction) Active Problems:   HYPERLIPIDEMIA   HYPERTENSION  62 y/o woman with multiple CRFs (HTN, HLN & Smoker) p/w SSx c/w ACS  - & ruled in for NSTEMI.  On heparin & ASA -- plan is for LHC +/- PCI today. On BB & ARB + Statin.  D-dimer mildly +, ? If cath with no culprit lesion, VQ scan vs. CTA.   LOS: 1 day    Isis Costanza W 03/30/2013, 8:53 AM    

## 2013-03-30 NOTE — Care Management Note (Signed)
    Page 1 of 1   03/30/2013     10:36:43 AM   CARE MANAGEMENT NOTE 03/30/2013  Patient:  Morgan Torres, Morgan Torres   Account Number:  [id number]  Date Initiated:  03/30/2013  Documentation initiated by:  Junius Creamer  Subjective/Objective Assessment:   admitted w mi     Action/Plan:   lives alone,pcp dr gerald hill   Anticipated DC Date:     Anticipated DC Plan:  HOME/SELF CARE      DC Planning Services  CM consult      Choice offered to / List presented to:             Status of service:   Medicare Important Message given?   (If response is "NO", the following Medicare IM given date fields will be blank) Date Medicare IM given:   Date Additional Medicare IM given:    Discharge Disposition:  HOME/SELF CARE  Per UR Regulation:  Reviewed for med. necessity/level of care/duration of stay  If discussed at Long Length of Stay Meetings, dates discussed:    Comments:

## 2013-03-30 NOTE — Progress Notes (Signed)
TR band removed, reverse allens test positive; pressure dressing applied

## 2013-03-30 NOTE — Progress Notes (Signed)
ANTICOAGULATION CONSULT NOTE - Follow Up Consult  Pharmacy Consult for Heparin Indication: chest pain and ACS  Allergies  Allergen Reactions  . Diclofenac Anaphylaxis  . Codeine Itching  . Hydrocodone Itching    Patient Measurements: Height: 5\' 8"  (172.7 cm) Weight: 216 lb 7.9 oz (98.2 kg) IBW/kg (Calculated) : 63.9 Heparin Dosing Weight: 86 kg  Vital Signs: Temp: 98.1 F (36.7 C) (10/06 0833) Temp src: Oral (10/06 0833) BP: 121/56 mmHg (10/06 0800) Pulse Rate: 61 (10/06 0833)  Labs:  Recent Labs  03/29/13 1607 03/29/13 2005 03/30/13 03/30/13 0929  HGB 15.0  --   --  14.1  HCT 43.0  --   --  40.9  PLT 296  --   --  263  APTT 29  --   --   --   LABPROT 13.4  --   --   --   INR 1.04  --   --   --   HEPARINUNFRC  --   --  <0.10* 0.18*  CREATININE 0.76  --   --  0.67  TROPONINI  --  1.40*  --   --     Estimated Creatinine Clearance: 88.2 ml/min (by C-G formula based on Cr of 0.67).   Medications:  Heparin at 1300 units/hr  Assessment: 62 yo F continues on heparin for CP.  Heparin level is improved although still subtherapeutic on heparin at 1300 units/hr.  Noted plans for cardiac cath this morning.  Will not re-bolus as patient will likely receive heparin boluses during the procedure.  Goal of Therapy:  Heparin level 0.3-0.7 units/ml Monitor platelets by anticoagulation protocol: Yes   Plan:  Increase Heparin to 1500 units/hr. Follow-up after cardiac cath procedure.  [employer], Pharm.D., BCPS Clinical Pharmacist Pager [phone] 03/30/2013 11:22 AM

## 2013-03-30 NOTE — Interval H&P Note (Signed)
History and Physical Interval Note:  03/30/2013 8:58 AM  [name]  has presented today for surgery, with the diagnosis of NSTEMI. The various methods of treatment have been discussed with the patient and family. After consideration of risks, benefits and other options for treatment, the patient has consented to  Procedure(s): LEFT HEART CATHETERIZATION WITH CORONARY ANGIOGRAM (N/A) +/- PERCUTANEOUS CORONARY INTERVENTINO as a surgical intervention .  The patient's history has been reviewed, patient examined, no change in status, stable for surgery.  I have reviewed the patient's chart and labs.  Questions were answered to the patient's satisfaction.     HARDING,DAVID W Cath Lab Visit (complete for each Cath Lab visit)  Clinical Evaluation Leading to the Procedure:   ACS: yes  Non-ACS:    Anginal Classification: CCS IV  Anti-ischemic medical therapy: Maximal Therapy (2 or more classes of medications)  Non-Invasive Test Results: No non-invasive testing performed  Prior CABG: No previous CABG

## 2013-03-30 NOTE — Progress Notes (Addendum)
ANTICOAGULATION CONSULT NOTE - Initial Consult  Pharmacy Consult for Restart IV Heparin post-cath Indication: chest pain/ACS; rule out PE  Allergies  Allergen Reactions  . Diclofenac Anaphylaxis  . Codeine Itching  . Hydrocodone Itching    Patient Measurements: Height: 5\' 8"  (172.7 cm) Weight: 216 lb 7.9 oz (98.2 kg) IBW/kg (Calculated) : Morgan.9 Heparin Dosing Weight: 86 kg  Vital Signs: Temp: 98 F (36.7 C) (10/06 1514) Temp src: Oral (10/06 1514) BP: 119/64 mmHg (10/06 1514) Pulse Rate: 59 (10/06 1514)  Labs:  Recent Labs  03/29/13 1607 03/29/13 2005 03/30/13 03/30/13 0929  HGB 15.0  --   --  14.1  HCT 43.0  --   --  40.9  PLT 296  --   --  263  APTT 29  --   --   --   LABPROT 13.4  --   --   --   INR 1.04  --   --   --   HEPARINUNFRC  --   --  <0.10* 0.18*  CREATININE 0.76  --   --  0.67  TROPONINI  --  1.40*  --  1.43*    Estimated Creatinine Clearance: 88.2 ml/min (by C-G formula based on Cr of 0.67).   Medical History: Past Medical History  Diagnosis Date  . Lumbar herniated disc   . Sciatica   . Hypertension   . Small bowel obstruction   . COPD (chronic obstructive pulmonary disease)   . Arthritis   . Depression   . Anxiety   . Bronchitis     Hx: of  . Pneumonia     Hx: of  . Fibromyalgia    Assessment: Morgan Torres on heparin for ACS - stopped for cardiac cath which revealed mild to moderate CAD. MD concerned for potential of PE due to positive D-dimer. Received orders to restart IV heparin 6 hours post TR Band removal.   Prior to cath, heparin was sub-therapeutic at 1300 units/hr. An increase to 1500 units/hr was made prior to cath without a bolus.  CBC was wnl this AM.  Estimated blood loss in procedure recorded as <50 cc on End of Case report.  TR band applied 14:22PM w/ 13 cc air.  4:11 PM- Contacted RN, patient with some rebleeding at site. Unsure of when will be able to remove band.  17:29PM- TR band removed by RN; no further bleeding per  RN.   Goal of Therapy:  Heparin level 0.3-0.7 units/ml Monitor platelets by anticoagulation protocol: Yes   Plan:  1. Restart heparin without a bolus at 1500 units/hr at  2330PM.  2. Check heparin level 6 hours post-restart- ok to do with AM labs.   3. Daily heparin level and CBC while on therapy.   Link Snuffer, PharmD, BCPS Clinical Pharmacist [phone] 03/30/2013,3:15 PM  Addnedum:  7:44 PM - CT Angio is negative for PE. Spoke with Boyce Medici, PA at Dr. Elissa Hefty office. Telephone orders to discontinue heparin.   Link Snuffer, PharmD, BCPS Clinical Pharmacist [phone] 03/30/2013

## 2013-03-30 NOTE — Progress Notes (Signed)
ANTICOAGULATION CONSULT NOTE Pharmacy Consult for Heparin  Indication: chest pain/ACS  Allergies  Allergen Reactions  . Diclofenac Anaphylaxis  . Codeine Itching  . Hydrocodone Itching    Patient Measurements: Height: 5\' 8"  (172.7 cm) Weight: 216 lb 0.8 oz (98 kg) IBW/kg (Calculated) : 63.9 Heparin Dosing Weight: 86 kg  Vital Signs: Temp: 98.8 F (37.1 C) (10/05 2300) Temp src: Oral (10/05 2300) BP: 129/59 mmHg (10/05 2200) Pulse Rate: 84 (10/05 2200)  Labs:  Recent Labs  03/29/13 1607 03/29/13 2005 03/30/13  HGB 15.0  --   --   HCT 43.0  --   --   PLT 296  --   --   APTT 29  --   --   LABPROT 13.4  --   --   INR 1.04  --   --   HEPARINUNFRC  --   --  <0.10*  CREATININE 0.76  --   --   TROPONINI  --  1.40*  --     Estimated Creatinine Clearance: 88.1 ml/min (by C-G formula based on Cr of 0.76).  Assessment: 62 y.o. female with chest pain for heparin   Goal of Therapy:  Heparin level 0.3-0.7 units/ml Monitor platelets by anticoagulation protocol: Yes   Plan:  Heparin 2000 units IV bolus, then increase heparin 1300 units/hr Check heparin level in 6 hours.  Geannie Risen, PharmD, BCPS  03/30/2013 1:24 AM

## 2013-03-30 NOTE — Progress Notes (Signed)
  Echocardiogram 2D Echocardiogram has been performed.  Arvil Chaco 03/30/2013, 4:54 PM

## 2013-03-30 NOTE — H&P (View-Only) (Signed)
    Subjective:  Intermitted CP overnight - Troponin up to 1.4  Objective:  Vital Signs in the last 24 hours: Temp:  [98.1 F (36.7 C)-99.4 F (37.4 C)] 98.1 F (36.7 C) (10/06 0833) Pulse Rate:  [61-84] 61 (10/06 0833) Resp:  [13-24] 13 (10/06 0000) BP: (105-143)/(54-89) 121/56 mmHg (10/06 0800) SpO2:  [92 %-99 %] 97 % (10/06 0833) Weight:  [216 lb 0.8 oz (98 kg)-220 lb (99.791 kg)] 216 lb 7.9 oz (98.2 kg) (10/06 0500)  Intake/Output from previous day: 10/05 0701 - 10/06 0700 In: 365.2 [I.V.:365.2] Out: 1 [Urine:1] Intake/Output from this shift:   Physical Exam: General appearance: alert, cooperative, appears stated age, no distress and moderately obese Neck: no adenopathy, no carotid bruit and no JVD Lungs: initially coarse BS biliaterally - ? rhonchorous, cleared with cough. Heart: regular rate and rhythm, S1, S2 normal, no murmur, click, rub or gallop and normal apical impulse Abdomen: soft, non-tender; bowel sounds normal; no masses,  no organomegaly Extremities: extremities normal, atraumatic, no cyanosis or edema Pulses: 2+ and symmetric Neurologic: Grossly normal; CN II -XII grossly intact  Lab Results:  Recent Labs  03/29/13 1607  WBC 8.5  HGB 15.0  PLT 296    Recent Labs  03/29/13 1607  NA 139  K 3.9  CL 99  CO2 27  GLUCOSE 102*  BUN 9  CREATININE 0.76    Recent Labs  03/29/13 2005  TROPONINI 1.40*   Hepatic Function Panel No results found for this basename: PROT, ALBUMIN, AST, ALT, ALKPHOS, BILITOT, BILIDIR, IBILI,  in the last 72 hours No results found for this basename: CHOL,  in the last 72 hours No results found for this basename: PROTIME,  in the last 72 hours  Imaging:  Imaging results have been reviewed  Cardiac Studies:  Assessment/Plan:  Principal Problem:   NSTEMI (non-ST elevated myocardial infarction) Active Problems:   HYPERLIPIDEMIA   HYPERTENSION  62 y/o woman with multiple CRFs (HTN, HLN & Smoker) p/w SSx c/w ACS  - & ruled in for NSTEMI.  On heparin & ASA -- plan is for [facility] +/- PCI today. On BB & ARB + Statin.  D-dimer mildly +, ? If cath with no culprit lesion, VQ scan vs. CTA.   LOS: 1 day    Morgan Torres W 03/30/2013, 8:53 AM

## 2013-03-31 DIAGNOSIS — F411 Generalized anxiety disorder: Secondary | ICD-10-CM

## 2013-03-31 LAB — CBC
Hemoglobin: 12.8 g/dL (ref 12.0–15.0)
MCV: 95 fL (ref 78.0–100.0)
Platelets: 260 10*3/uL (ref 150–400)
RDW: 13.8 % (ref 11.5–15.5)
WBC: 6.2 10*3/uL (ref 4.0–10.5)

## 2013-03-31 LAB — TROPONIN I: Troponin I: 1.5 ng/mL (ref <0.30)

## 2013-03-31 MED ORDER — AMLODIPINE BESYLATE 5 MG PO TABS
5.0000 mg | ORAL_TABLET | Freq: Every day | ORAL | Status: DC
  Administered 2013-04-01: 5 mg via ORAL
  Filled 2013-03-31: qty 1

## 2013-03-31 MED ORDER — GUAIFENESIN 100 MG/5ML PO SYRP
100.0000 mg | ORAL_SOLUTION | ORAL | Status: DC | PRN
  Administered 2013-03-31: 100 mg via ORAL
  Filled 2013-03-31: qty 5

## 2013-03-31 MED ORDER — GUAIFENESIN 100 MG/5ML PO SYRP
200.0000 mg | ORAL_SOLUTION | ORAL | Status: DC | PRN
  Filled 2013-03-31: qty 10

## 2013-03-31 MED ORDER — NICOTINE 14 MG/24HR TD PT24
14.0000 mg | MEDICATED_PATCH | Freq: Every day | TRANSDERMAL | Status: DC
  Administered 2013-03-31 – 2013-04-01 (×2): 14 mg via TRANSDERMAL
  Filled 2013-03-31 (×2): qty 1

## 2013-03-31 NOTE — Progress Notes (Signed)
Around 4098 the pt started c/o 3 out of 10 mid chest pressure. VS taken were stable & charted. An ekg was done that showed SB with T wave abnormality. Pt was given the 1st nitro at 0447. Pt chest pressure went up. Pt was given the 2nd Nitro at 0452 and was chest pain free at 0457. The cardiologist on call was made aware. Will continue to monitor the pt. Sanda Linger

## 2013-03-31 NOTE — Progress Notes (Signed)
Subjective: Complaining of CP now.  Exacerbated with inspiration.    Objective: Vital signs in last 24 hours: Temp:  [97.7 F (36.5 C)-98 F (36.7 C)] 97.7 F (36.5 C) (10/07 0432) Pulse Rate:  [52-61] 60 (10/07 0457) Resp:  [15-18] 18 (10/07 0432) BP: (96-159)/(43-74) 107/67 mmHg (10/07 0457) SpO2:  [92 %-100 %] 98 % (10/07 0432) Last BM Date: 03/30/13  Intake/Output from previous day: 10/06 0701 - 10/07 0700 In: 1169 [P.O.:294; I.V.:875] Out: 2 [Urine:2] Intake/Output this shift:    Medications Current Facility-Administered Medications  Medication Dose Route Frequency Provider Last Rate Last Dose  . 0.9 %  sodium chloride infusion  250 mL Intravenous PRN Marykay Lex, MD      . acetaminophen (TYLENOL) tablet 650 mg  650 mg Oral Q4H PRN Wilburt Finlay, PA-C   650 mg at 03/29/13 2337  . albuterol (PROVENTIL HFA;VENTOLIN HFA) 108 (90 BASE) MCG/ACT inhaler 1-2 puff  1-2 puff Inhalation Q6H PRN Wilburt Finlay, PA-C      . amLODipine (NORVASC) tablet 2.5 mg  2.5 mg Oral Daily Marykay Lex, MD   2.5 mg at 03/30/13 1725  . aspirin EC tablet 81 mg  81 mg Oral Daily Wilburt Finlay, PA-C   81 mg at 03/30/13 1008  . atorvastatin (LIPITOR) tablet 40 mg  40 mg Oral q1800 Wilburt Finlay, PA-C   40 mg at 03/30/13 2054  . butalbital-acetaminophen-caffeine (FIORICET, ESGIC) 50-325-40 MG per tablet 1-2 tablet  1-2 tablet Oral Q4H PRN Wilburt Finlay, PA-C      . gabapentin (NEURONTIN) capsule 100 mg  100 mg Oral QHS Wilburt Finlay, PA-C   100 mg at 03/30/13 2219  . hydrochlorothiazide (MICROZIDE) capsule 12.5 mg  12.5 mg Oral Daily Chrystie Nose, MD   12.5 mg at 03/30/13 1008  . LORazepam (ATIVAN) tablet 1 mg  1 mg Oral Daily PRN Wilburt Finlay, PA-C      . losartan (COZAAR) tablet 100 mg  100 mg Oral Daily Chrystie Nose, MD   100 mg at 03/30/13 1008  . metoprolol tartrate (LOPRESSOR) tablet 12.5 mg  12.5 mg Oral BID Wilburt Finlay, PA-C   12.5 mg at 03/30/13 2219  . nitroGLYCERIN (NITROSTAT) SL tablet  0.4 mg  0.4 mg Sublingual Q5 Min x 3 PRN Wilburt Finlay, PA-C   0.4 mg at 03/31/13 0453  . ondansetron (ZOFRAN) injection 4 mg  4 mg Intravenous Q6H PRN Wilburt Finlay, PA-C      . oxyCODONE (Oxy IR/ROXICODONE) immediate release tablet 15-30 mg  15-30 mg Oral Q4H PRN Wilburt Finlay, PA-C   30 mg at 03/30/13 2059  . sodium chloride 0.9 % injection 3 mL  3 mL Intravenous Q12H Marykay Lex, MD      . sodium chloride 0.9 % injection 3 mL  3 mL Intravenous PRN Marykay Lex, MD        PE: General appearance: alert, cooperative and no distress Lungs: clear to auscultation bilaterally Heart: regular rate and rhythm Extremities: No LEE Pulses: 2+ and symmetric Skin: warm and dry  Lab Results:   Recent Labs  03/29/13 1607 03/30/13 0929 03/31/13 0540  WBC 8.5 6.7 6.2  HGB 15.0 14.1 12.8  HCT 43.0 40.9 39.5  PLT 296 263 260   BMET  Recent Labs  03/29/13 1607 03/30/13 0929  NA 139 137  K 3.9 3.9  CL 99 104  CO2 27 23  GLUCOSE 102* 105*  BUN 9 8  CREATININE 0.76 0.67  CALCIUM 9.2 8.9   PT/INR  Recent Labs  03/29/13 1607  LABPROT 13.4  INR 1.04   Cholesterol  Recent Labs  03/30/13 0929  CHOL 231*   Lipid Panel     Component Value Date/Time   CHOL 231* 03/30/2013 0929   TRIG 105 03/30/2013 0929   HDL 51 03/30/2013 0929   CHOLHDL 4.5 03/30/2013 0929   VLDL 21 03/30/2013 0929   LDLCALC 159* 03/30/2013 0929    Cardiac Panel (last 3 results)  Recent Labs  03/29/13 2005 03/30/13 0929  TROPONINI 1.40* 1.43*    Assessment/Plan   Principal Problem:   NSTEMI (non-ST elevated myocardial infarction) Active Problems:   HYPERLIPIDEMIA   HYPERTENSION  Plan:  SP coronary angiography revealing mild to moderate CAD.  No culprit lesion. CT angio negative for PE.  Echo results pending.  Current CP symptom is reproducible with palpation and inspiration.  She is on 2.5mg  of amlodipine but BP is not adequate to increase it.  Will recheck troponin to make sure it is trending  down.  Lipitor 40(LDL 159), HCTZ, Cozaar, lopressor 12.5.  Ambulate in hall today.  Consider short course of NSAID or IV toradol once for pain if it gets worse.  Echo pending.    LOS: 2 days    HAGER, BRYAN 03/31/2013 9:33 AM  Patient seen and examined. Agree with assessment and plan. Pt has had some recurrent chest tightness ans well as muild pleuritic component. Mild CAD noted on cath ? Component of spasm in etiology of positive troponin.  BP 110 presently. Will ty to increase amlodipine to 5 mg daily if BP allows. Discussed smoking cessation and nicotine induced vasospasm/endothelial dysfunction. Now on lipitor 40 mg with LDL target < 70. Keep in hospital today, prob dc tomorrow.   Lennette Bihari, MD, [facility] 03/31/2013 10:39 AM

## 2013-03-31 NOTE — Progress Notes (Addendum)
CARDIAC REHAB PHASE I   PRE:  Rate/Rhythm: 55 SB    BP: sitting 110/60    SaO2:   MODE:  Ambulation: 100 ft, sat after 25 ft  POST:  Rate/Rhythm: 67 SR    BP: sitting 110/64     SaO2: 99 RA  Pt in bed before walk c/o chest aching. Worse with inhalation. Able to get out of bed independently and done her back brace. Pt began walking with strong steps with her cane but seemed more uneasy with each step. After 25 ft in hall pt needed to sit. Sts she is shaky and her head doesn't feel right. Pt tearful. BP stable with machine at 139/59. Sat 5 min then able to walk with RW for another 75 ft. No more shakiness, sts she just feels tired. To recliner. Still tearful. Pt sts her chest achiness is relieved. Pt seems generally nervous, which she has a hx of depression and anxiety. Also sts she is wanting a cigarette. Discussed all ed including smoking cessation using nicotine patches. Pt requests a patch now. Interested in [facility] and will send referral to [place]. Pt needs to walk this pm after getting her nicotine patch.  [phone]  Morgan Torres [place] CES, ACSM 03/31/2013 11:06 AM

## 2013-03-31 NOTE — Progress Notes (Signed)
Pt had questions in regards to disability. CM did call Financial Counselor to see if she was able to answer any questions for pt. No further needs from CM at this time. Gala Lewandowsky, RN,BSN [phone]

## 2013-03-31 NOTE — Progress Notes (Signed)
Chaplain received in-person referral from pt's RN while on unit. RN said the pt "was crying earlier." Pt was lying in bed visiting with her granddaughter. 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 Chaplain explained that pt can ask her RN to request a visit from chaplain later.   Please page if pt requests a visit.   Guy Sandifer [place], [place] [phone] -- 8:30am-5pm [phone] -- after hours

## 2013-03-31 NOTE — Progress Notes (Deleted)
D/c orders received;IV removed with gauze on, pt remains in stable condition, pt meds and instructions reviewed and given to pt; pt d/c to home 

## 2013-04-01 DIAGNOSIS — F17200 Nicotine dependence, unspecified, uncomplicated: Secondary | ICD-10-CM | POA: Diagnosis present

## 2013-04-01 DIAGNOSIS — I428 Other cardiomyopathies: Secondary | ICD-10-CM | POA: Diagnosis present

## 2013-04-01 LAB — CBC
MCH: 31.7 pg (ref 26.0–34.0)
MCV: 93 fL (ref 78.0–100.0)
Platelets: 236 10*3/uL (ref 150–400)
RBC: 4.16 MIL/uL (ref 3.87–5.11)
RDW: 13.3 % (ref 11.5–15.5)

## 2013-04-01 LAB — TROPONIN I: Troponin I: 0.66 ng/mL (ref <0.30)

## 2013-04-01 MED ORDER — AMLODIPINE BESYLATE 5 MG PO TABS: 5.0000 mg | ORAL_TABLET | Freq: Every day | ORAL | Status: DC

## 2013-04-01 MED ORDER — INFLUENZA VAC SPLIT QUAD 0.5 ML IM SUSP: 0.5000 mL | INTRAMUSCULAR | Status: DC

## 2013-04-01 MED ORDER — ASPIRIN 81 MG PO TBEC: 81.0000 mg | DELAYED_RELEASE_TABLET | Freq: Every day | ORAL | Status: DC

## 2013-04-01 MED ORDER — ACETAMINOPHEN 325 MG PO TABS: 650.0000 mg | ORAL_TABLET | ORAL | Status: DC | PRN

## 2013-04-01 MED ORDER — INFLUENZA VAC SPLIT QUAD 0.5 ML IM SUSP
0.5000 mL | Freq: Once | INTRAMUSCULAR | Status: AC
  Administered 2013-04-01: 0.5 mL via INTRAMUSCULAR
  Filled 2013-04-01: qty 0.5

## 2013-04-01 MED ORDER — ATORVASTATIN CALCIUM 40 MG PO TABS: 40.0000 mg | ORAL_TABLET | Freq: Every day | ORAL | Status: DC

## 2013-04-01 MED ORDER — NITROGLYCERIN 0.4 MG SL SUBL: 0.4000 mg | SUBLINGUAL_TABLET | SUBLINGUAL | Status: DC | PRN

## 2013-04-01 MED ORDER — METOPROLOL TARTRATE 12.5 MG HALF TABLET: 12.5000 mg | ORAL_TABLET | Freq: Two times a day (BID) | ORAL | Status: DC

## 2013-04-01 MED ORDER — NICOTINE 14 MG/24HR TD PT24: 1.0000 | MEDICATED_PATCH | Freq: Every day | TRANSDERMAL | Status: DC

## 2013-04-01 NOTE — Progress Notes (Signed)
Subjective: No further CP  Objective: Vital signs in last 24 hours: Temp:  [97.7 F (36.5 C)-98.3 F (36.8 C)] 97.7 F (36.5 C) (10/08 0610) Pulse Rate:  [52-74] 52 (10/08 0610) Resp:  [18-20] 18 (10/08 0610) BP: (93-136)/(43-58) 116/56 mmHg (10/08 0610) SpO2:  [94 %-100 %] 97 % (10/08 0610) Last BM Date: 03/30/13  Intake/Output from previous day:   Intake/Output this shift:    Medications Current Facility-Administered Medications  Medication Dose Route Frequency Provider Last Rate Last Dose  . 0.9 %  sodium chloride infusion  250 mL Intravenous PRN Marykay Lex, MD      . acetaminophen (TYLENOL) tablet 650 mg  650 mg Oral Q4H PRN Wilburt Finlay, PA-C   650 mg at 03/29/13 2337  . albuterol (PROVENTIL HFA;VENTOLIN HFA) 108 (90 BASE) MCG/ACT inhaler 1-2 puff  1-2 puff Inhalation Q6H PRN Wilburt Finlay, PA-C      . amLODipine (NORVASC) tablet 5 mg  5 mg Oral Daily Lennette Bihari, MD      . aspirin EC tablet 81 mg  81 mg Oral Daily Wilburt Finlay, PA-C   81 mg at 03/31/13 0948  . atorvastatin (LIPITOR) tablet 40 mg  40 mg Oral q1800 Wilburt Finlay, PA-C   40 mg at 03/31/13 1728  . butalbital-acetaminophen-caffeine (FIORICET, ESGIC) 50-325-40 MG per tablet 1-2 tablet  1-2 tablet Oral Q4H PRN Wilburt Finlay, PA-C      . gabapentin (NEURONTIN) capsule 100 mg  100 mg Oral QHS Wilburt Finlay, PA-C   100 mg at 03/31/13 2153  . guaifenesin (ROBITUSSIN) 100 MG/5ML syrup 100 mg  100 mg Oral Q4H PRN Chrystie Nose, MD   100 mg at 03/31/13 2056  . hydrochlorothiazide (MICROZIDE) capsule 12.5 mg  12.5 mg Oral Daily Chrystie Nose, MD   12.5 mg at 03/31/13 0948  . LORazepam (ATIVAN) tablet 1 mg  1 mg Oral Daily PRN Wilburt Finlay, PA-C      . losartan (COZAAR) tablet 100 mg  100 mg Oral Daily Chrystie Nose, MD   100 mg at 03/31/13 0945  . metoprolol tartrate (LOPRESSOR) tablet 12.5 mg  12.5 mg Oral BID Wilburt Finlay, PA-C   12.5 mg at 03/31/13 2153  . nicotine (NICODERM CQ - dosed in mg/24 hours) patch  14 mg  14 mg Transdermal Daily Lennette Bihari, MD   14 mg at 03/31/13 1147  . nitroGLYCERIN (NITROSTAT) SL tablet 0.4 mg  0.4 mg Sublingual Q5 Min x 3 PRN Wilburt Finlay, PA-C   0.4 mg at 03/31/13 0453  . ondansetron (ZOFRAN) injection 4 mg  4 mg Intravenous Q6H PRN Wilburt Finlay, PA-C      . oxyCODONE (Oxy IR/ROXICODONE) immediate release tablet 15-30 mg  15-30 mg Oral Q4H PRN Wilburt Finlay, PA-C   30 mg at 03/30/13 2059  . sodium chloride 0.9 % injection 3 mL  3 mL Intravenous Q12H Marykay Lex, MD   3 mL at 03/31/13 2153  . sodium chloride 0.9 % injection 3 mL  3 mL Intravenous PRN Marykay Lex, MD        PE: General appearance: alert, cooperative and no distress  Lungs: clear to auscultation bilaterally  Chest:  nontender to palpation or inspiration  Heart: regular rate and rhythm  Extremities: No LEE  Pulses: 2+ and symmetric  Skin: warm and dry   Lab Results:   Recent Labs  03/30/13 0929 03/31/13 0540 04/01/13 0450  WBC 6.7 6.2 6.2  HGB  14.1 12.8 13.2  HCT 40.9 39.5 38.7  PLT 263 260 236   BMET  Recent Labs  03/29/13 1607 03/30/13 0929  NA 139 137  K 3.9 3.9  CL 99 104  CO2 27 23  GLUCOSE 102* 105*  BUN 9 8  CREATININE 0.76 0.67  CALCIUM 9.2 8.9   PT/INR  Recent Labs  03/29/13 1607  LABPROT 13.4  INR 1.04   Cholesterol  Recent Labs  03/30/13 0929  CHOL 231*   Lipid Panel     Component Value Date/Time   CHOL 231* 03/30/2013 0929   TRIG 105 03/30/2013 0929   HDL 51 03/30/2013 0929   CHOLHDL 4.5 03/30/2013 0929   VLDL 21 03/30/2013 0929   LDLCALC 159* 03/30/2013 0929       Assessment/Plan  Principal Problem:   NSTEMI (non-ST elevated myocardial infarction) Active Problems:   HYPERLIPIDEMIA   HYPERTENSION  Plan:  SP coronary angiography revealing mild to moderate CAD. No culprit lesion. CT angio negative for PE. Echo:  EF 55% normal wall motion.   Pleuritic CP from yesterday has resolved.  Dr. Tresa Endo bumped up her amlodipine yesterday to  5mg .  BP seems to be tolerating it.  Rechecked troponin had actually gone up to 1.5.  Will recheck now.  Lipitor 40(LDL 159), HCTZ, Cozaar, lopressor 12.5. Ambulate in hall today.   Hopefully DC home today.    LOS: 3 days    HAGER, BRYAN 04/01/2013 9:47 AM  I have seen and examined the patient along with Wilburt Finlay, PA  I have reviewed the chart, notes and new data.  I agree with PA's note.  Key new complaints: no further pain Key examination changes: no clinical signs of CHF or arrhythmia Key new findings / data: troponin trending down  PLAN: DC home. Strongly emphasized need for smoking cessation. She will use nicotine patches.  Amlodipine. NTG SL prn. Statin, ARB, ASA.  Thurmon Fair, MD, [facility] [phone] 04/01/2013, 2:20 PM

## 2013-04-01 NOTE — Discharge Summary (Signed)
Patient ID: Morgan Torres,  MRN: [id number], DOB/AGE: Oct 14, 1950 62 y.o.  Admit date: 03/29/2013 Discharge date: 04/01/2013  Primary Care Provider:  Primary Cardiologist: Dr Rennis Golden  Discharge Diagnoses Principal Problem:   NSTEMI - Troponin pk 1.5 with ant TWI on EKG Active Problems:   Takotsubo physiology at cath 03/30/13- EF 45% at cath, 55% by echo   HYPERLIPIDEMIA   HYPERTENSION   Spinal stenosis, lumbar region, with neurogenic claudication   Smoking    Procedures: Coronary angiogram 03/30/13   Hospital Course:  62 y/o AA female smoker with HTN admitted 03/29/13 with [place] and later ruled in for a NSTEMI with a Troponin peak of 1.5. Cath done 03/30/13 revealed angiographically only mild to moderate CAD, with no obvious "Culprit Lesion" for NSTEMI. She also had bnormal LV Gram findings with WMA suggestive of Takotsubo cardiomyopathy. Echo revealed an EF of %%% with no obvious WMA. The plan is for medical Rx. She has been counseled on smoking cessation. She can follow up with Wilburt Finlay in one to two weeks, then Dr Rennis Golden in a couple of months. It should be noted that she also developed post NSTEMI anterior TWI on EKG.      Discharge Vitals:  Blood pressure 115/66, pulse 55, temperature 98 F (36.7 C), temperature source Oral, resp. rate 19, height 5\' 8"  (1.727 m), weight 216 lb 7.9 oz (98.2 kg), SpO2 96.00%.    Labs: Results for orders placed during the hospital encounter of 03/29/13 (from the past 48 hour(s))  CBC     Status: None   Collection Time    03/31/13  5:40 AM      Result Value Range   WBC 6.2  4.0 - 10.5 K/uL   RBC 4.16  3.87 - 5.11 MIL/uL   Hemoglobin 12.8  12.0 - 15.0 g/dL   HCT 40.9  81.1 - 91.4 %   MCV 95.0  78.0 - 100.0 fL   MCH 30.8  26.0 - 34.0 pg   MCHC 32.4  30.0 - 36.0 g/dL   RDW 78.2  95.6 - 21.3 %   Platelets 260  150 - 400 K/uL  TROPONIN I     Status: Abnormal   Collection Time    03/31/13 10:25 AM      Result Value Range   Troponin I 1.50 (*)  <0.30 ng/mL   Comment:            Due to the release kinetics of cTnI,     a negative result within the first hours     of the onset of symptoms does not rule out     myocardial infarction with certainty.     If myocardial infarction is still suspected,     repeat the test at appropriate intervals.     REPEATED TO VERIFY     CRITICAL VALUE NOTED.  VALUE IS CONSISTENT WITH PREVIOUSLY REPORTED AND CALLED VALUE.  CBC     Status: None   Collection Time    04/01/13  4:50 AM      Result Value Range   WBC 6.2  4.0 - 10.5 K/uL   RBC 4.16  3.87 - 5.11 MIL/uL   Hemoglobin 13.2  12.0 - 15.0 g/dL   HCT 08.6  57.8 - 46.9 %   MCV 93.0  78.0 - 100.0 fL   MCH 31.7  26.0 - 34.0 pg   MCHC 34.1  30.0 - 36.0 g/dL   RDW 62.9  52.8 - 41.3 %  Platelets 236  150 - 400 K/uL  TROPONIN I     Status: Abnormal   Collection Time    04/01/13 11:25 AM      Result Value Range   Troponin I 0.66 (*) <0.30 ng/mL   Comment:            Due to the release kinetics of cTnI,     a negative result within the first hours     of the onset of symptoms does not rule out     myocardial infarction with certainty.     If myocardial infarction is still suspected,     repeat the test at appropriate intervals.     REPEATED TO VERIFY     CRITICAL VALUE NOTED.  VALUE IS CONSISTENT WITH PREVIOUSLY REPORTED AND CALLED VALUE.    Disposition:  Follow-up Information   Follow up with HAGER, BRYAN, PA-C. (office will call you)    Specialty:  Physician Assistant   Contact information:   [address] [place] [address] [phone]       Discharge Medications:    Medication List         acetaminophen 325 MG tablet  Commonly known as:  TYLENOL  Take 2 tablets (650 mg total) by mouth every 4 (four) hours as needed.     albuterol 108 (90 BASE) MCG/ACT inhaler  Commonly known as:  PROVENTIL HFA;VENTOLIN HFA  Inhale 1-2 puffs into the lungs every 6 (six) hours as needed for wheezing or shortness of  breath.     amLODipine 5 MG tablet  Commonly known as:  NORVASC  Take 1 tablet (5 mg total) by mouth daily.     aspirin 81 MG EC tablet  Take 1 tablet (81 mg total) by mouth daily.     atorvastatin 40 MG tablet  Commonly known as:  LIPITOR  Take 1 tablet (40 mg total) by mouth daily at 6 PM.     butalbital-acetaminophen-caffeine 50-325-40 MG per tablet  Commonly known as:  FIORICET, ESGIC  Take 1-2 tablets by mouth every 4 (four) hours as needed for headache.     EPINEPHrine 0.3 mg/0.3 mL Soaj injection  Commonly known as:  EPIPEN 2-PAK  Inject 0.3 mLs (0.3 mg total) into the muscle once as needed (for severe allergic reaction). CAll 911 immediately if you have to use this medicine     gabapentin 100 MG capsule  Commonly known as:  NEURONTIN  Take 100 mg by mouth at bedtime.     LORazepam 1 MG tablet  Commonly known as:  ATIVAN  Take 1 mg by mouth daily as needed for anxiety.     losartan-hydrochlorothiazide 100-12.5 MG per tablet  Commonly known as:  HYZAAR  Take 1 tablet by mouth daily.     metoprolol tartrate 12.5 mg Tabs tablet  Commonly known as:  LOPRESSOR  Take 0.5 tablets (12.5 mg total) by mouth 2 (two) times daily.     nicotine 14 mg/24hr patch  Commonly known as:  NICODERM CQ - dosed in mg/24 hours  Place 1 patch onto the skin daily.     nitroGLYCERIN 0.4 MG SL tablet  Commonly known as:  NITROSTAT  Place 1 tablet (0.4 mg total) under the tongue every 5 (five) minutes x 3 doses as needed for chest pain.     oxyCODONE 15 MG immediate release tablet  Commonly known as:  ROXICODONE  Take 1-2 tablets (15-30 mg total) by mouth every 4 (four) hours  as needed for pain.     traMADol 50 MG tablet  Commonly known as:  ULTRAM  Take 50 mg by mouth every 4 (four) hours as needed for pain.         Duration of Discharge Encounter: Greater than 30 minutes including physician time.  Jolene Provost PA-C 04/01/2013 1:48 PM

## 2013-04-01 NOTE — Progress Notes (Signed)
CARDIAC REHAB PHASE I   PRE:  Rate/Rhythm: 47 SB126/70    BP: sitting     SaO2:   MODE:  Ambulation:150 ft   POST:  Rate/Rhythm: 66 SR    BP: sitting 136/70     SaO2:   Pt ambulated with RW, assist x1. C/o dizziness and fatigue at 75 ft. Sts she needed to sit but then able to make it back to room. Slow pace, several rests. Pt sts she was riding bike and walking down sidewalk with therapy x2 a week PTA. Presume otherwise she was not very active. Pt is overwhelmed and began crying when her daughter called to discuss another matter. Sts she cannot handle the people around her anymore and wants to move away. Encouraged her to discuss these issues with her PCP and also to try exercise for stress relief. Pt requested something for anxiety.  [phone]   Elissa Lovett [place] CES, ACSM 04/01/2013 11:23 AM

## 2013-04-07 ENCOUNTER — Telehealth: Payer: Self-pay | Admitting: *Deleted

## 2013-04-07 NOTE — Telephone Encounter (Signed)
Faxed Physician Order and Initial Individualized Treatment Plan for Cardiac Rehab Phase II on 04/07/13 

## 2013-04-20 ENCOUNTER — Ambulatory Visit (INDEPENDENT_AMBULATORY_CARE_PROVIDER_SITE_OTHER): Payer: BC Managed Care – PPO | Admitting: Physician Assistant

## 2013-04-20 ENCOUNTER — Encounter: Payer: Self-pay | Admitting: Physician Assistant

## 2013-04-20 VITALS — BP 120/68 | HR 46 | Ht 68.0 in | Wt 219.9 lb

## 2013-04-20 DIAGNOSIS — F17200 Nicotine dependence, unspecified, uncomplicated: Secondary | ICD-10-CM

## 2013-04-20 DIAGNOSIS — E669 Obesity, unspecified: Secondary | ICD-10-CM | POA: Insufficient documentation

## 2013-04-20 DIAGNOSIS — I2510 Atherosclerotic heart disease of native coronary artery without angina pectoris: Secondary | ICD-10-CM

## 2013-04-20 DIAGNOSIS — I5181 Takotsubo syndrome: Secondary | ICD-10-CM

## 2013-04-20 NOTE — Assessment & Plan Note (Signed)
No further CP.  On amlodipine.

## 2013-04-20 NOTE — Progress Notes (Signed)
Date:  04/20/2013   ID:  [name], DOB Oct 19, 1950, MRN [id number]  PCP:  Evlyn Courier, MD  Primary Cardiologist:  [facility]    History of Present Illness: Morgan Torres is a 62 y.o. female smoker with HTN admitted 03/29/13 with [place] and later ruled in for a NSTEMI with a Troponin peak of 1.5. Cath done 03/30/13 revealed angiographically only mild to moderate CAD, with no obvious "Culprit Lesion" for NSTEMI. She also had bnormal LV Gram findings with WMA suggestive of Takotsubo cardiomyopathy. Echo revealed an EF of 55% with no obvious WMA. The plan is for medical Rx. She has been counseled on smoking cessation.  It should be noted that she also developed post NSTEMI anterior TWI on EKG.   Patient presents today for posthospital evaluation.  The patient reports doing good.  She has not smoked since being discharged.  She had some mild CP with day after discharge but none since.  She currently denies nausea, vomiting, fever, shortness of breath, orthopnea, dizziness, PND, cough, congestion, abdominal pain, hematochezia, melena, lower extremity edema.  Wt Readings from Last 3 Encounters:  04/20/13 219 lb 14.4 oz (99.746 kg)  03/30/13 216 lb 7.9 oz (98.2 kg)  03/30/13 216 lb 7.9 oz (98.2 kg)     Past Medical History  Diagnosis Date  . Lumbar herniated disc   . Sciatica   . Hypertension   . Small bowel obstruction   . COPD (chronic obstructive pulmonary disease)   . Arthritis   . Depression   . Anxiety   . Bronchitis     Hx: of  . Pneumonia     Hx: of  . Fibromyalgia     Current Outpatient Prescriptions  Medication Sig Dispense Refill  . acetaminophen (TYLENOL) 325 MG tablet Take 2 tablets (650 mg total) by mouth every 4 (four) hours as needed.      Marland Kitchen albuterol (PROVENTIL HFA;VENTOLIN HFA) 108 (90 BASE) MCG/ACT inhaler Inhale 1-2 puffs into the lungs every 6 (six) hours as needed for wheezing or shortness of breath.      Marland Kitchen amLODipine (NORVASC) 5 MG tablet Take 1 tablet (5  mg total) by mouth daily.  30 tablet  11  . aspirin EC 81 MG EC tablet Take 1 tablet (81 mg total) by mouth daily.      Marland Kitchen atorvastatin (LIPITOR) 40 MG tablet Take 1 tablet (40 mg total) by mouth daily at 6 PM.  30 tablet  11  . butalbital-acetaminophen-caffeine (FIORICET, ESGIC) 50-325-40 MG per tablet Take 1-2 tablets by mouth every 4 (four) hours as needed for headache.  40 tablet  0  . EPINEPHrine (EPIPEN 2-PAK) 0.3 mg/0.3 mL DEVI Inject 0.3 mLs (0.3 mg total) into the muscle once as needed (for severe allergic reaction). CAll 911 immediately if you have to use this medicine  1 Device  1  . gabapentin (NEURONTIN) 100 MG capsule Take 100 mg by mouth at bedtime.      Marland Kitchen LORazepam (ATIVAN) 1 MG tablet Take 1 mg by mouth daily as needed for anxiety.      Marland Kitchen losartan-hydrochlorothiazide (HYZAAR) 100-12.5 MG per tablet Take 1 tablet by mouth daily.      . metoprolol tartrate (LOPRESSOR) 25 MG tablet Take 12.5 mg by mouth 2 (two) times daily.      . nicotine (NICODERM CQ - DOSED IN MG/24 HOURS) 21 mg/24hr patch Place 1 patch onto the skin daily.      . nitroGLYCERIN (NITROSTAT) 0.4  MG SL tablet Place 1 tablet (0.4 mg total) under the tongue every 5 (five) minutes x 3 doses as needed for chest pain.  25 tablet  2  . oxyCODONE (ROXICODONE) 15 MG immediate release tablet Take 1-2 tablets (15-30 mg total) by mouth every 4 (four) hours as needed for pain.  90 tablet  0  . traMADol (ULTRAM) 50 MG tablet Take 50 mg by mouth every 4 (four) hours as needed for pain.       No current facility-administered medications for this visit.    Allergies:    Allergies  Allergen Reactions  . Diclofenac Anaphylaxis  . Codeine Itching  . Hydrocodone Itching    Social History:  The patient  reports that she quit smoking about 3 weeks ago. Her smoking use included Cigarettes. She has a 25 pack-year smoking history. She has never used smokeless tobacco. She reports that she drinks alcohol. She reports that she does not  use illicit drugs.   Family history:   Family History  Problem Relation Age of Onset  . Hypertension Mother   . Diabetes Mother   . Cancer - Other Mother   . Cancer Sister     ROS:  Please see the history of present illness.  All other systems reviewed and negative.   PHYSICAL EXAM: VS:  BP 120/68  Pulse 46  Ht 5\' 8"  (1.727 m)  Wt 219 lb 14.4 oz (99.746 kg)  BMI 33.44 kg/m2 Well nourished, well developed, in no acute distress HEENT: Pupils are equal round react to light accommodation extraocular movements are intact.  Neck: no JVDNo cervical lymphadenopathy. Cardiac: Regular rate and rhythm without murmurs rubs or gallops. Lungs:  clear to auscultation bilaterally, no wheezing, rhonchi or rales Abd: soft, nontender, positive bowel sounds all quadrants, no hepatosplenomegaly Ext: no lower extremity edema.  2+ radial and dorsalis pedis pulses. Skin: warm and dry Neuro:  Grossly normal  EKG:  Sinus bradycardia 46.  Lateral TWI.  ASSESSMENT AND PLAN:  Problem List Items Addressed This Visit   Takotsubo physiology at cath 03/30/13- EF 45% at cath, 55% by echo     No further CP.  On amlodipine.    Relevant Medications      metoprolol tartrate (LOPRESSOR) 25 MG tablet   Smoking (Chronic)     She reports not smoking since being discharged.     Obesity (BMI 30.0-34.9)     i will arrange a nutritional consult.     Other Visit Diagnoses   CAD (coronary artery disease)    -  Primary    Relevant Medications       metoprolol tartrate (LOPRESSOR) 25 MG tablet       Follow up in 6 months.

## 2013-04-20 NOTE — Patient Instructions (Signed)
1.  I will set you up for a nutritional consult. 2.  Keep up the good work in regards to not smoking. 3.  Follow up with Dr. Rennis Golden in 3 months or sooner if needed.

## 2013-04-20 NOTE — Assessment & Plan Note (Signed)
i will arrange a nutritional consult.

## 2013-04-20 NOTE — Assessment & Plan Note (Signed)
She reports not smoking since being discharged.

## 2013-04-21 ENCOUNTER — Encounter: Payer: Self-pay | Admitting: Physician Assistant

## 2013-05-14 ENCOUNTER — Encounter (HOSPITAL_COMMUNITY)
Admission: RE | Admit: 2013-05-14 | Discharge: 2013-05-14 | Disposition: A | Discharge status: Home / Self Care | Payer: BC Managed Care – PPO | Source: Ambulatory Visit | Attending: Internal Medicine | Admitting: Internal Medicine

## 2013-05-14 ENCOUNTER — Other Ambulatory Visit (HOSPITAL_COMMUNITY): Payer: Self-pay | Admitting: Family Medicine

## 2013-05-14 ENCOUNTER — Ambulatory Visit (HOSPITAL_COMMUNITY)
Admission: RE | Admit: 2013-05-14 | Discharge: 2013-05-14 | Disposition: A | Discharge status: Home / Self Care | Payer: BC Managed Care – PPO | Source: Ambulatory Visit | Attending: Family Medicine | Admitting: Family Medicine

## 2013-05-14 DIAGNOSIS — R52 Pain, unspecified: Secondary | ICD-10-CM

## 2013-05-14 DIAGNOSIS — Z5189 Encounter for other specified aftercare: Secondary | ICD-10-CM | POA: Insufficient documentation

## 2013-05-14 DIAGNOSIS — I2510 Atherosclerotic heart disease of native coronary artery without angina pectoris: Secondary | ICD-10-CM | POA: Insufficient documentation

## 2013-05-14 DIAGNOSIS — M25519 Pain in unspecified shoulder: Secondary | ICD-10-CM | POA: Insufficient documentation

## 2013-05-14 DIAGNOSIS — I214 Non-ST elevation (NSTEMI) myocardial infarction: Secondary | ICD-10-CM | POA: Insufficient documentation

## 2013-05-14 NOTE — Progress Notes (Signed)
Cardiac Rehab Medication Review by a Pharmacist  Does the patient  feel that his/her medications are working for him/her?  yes  Has the patient been experiencing any side effects to the medications prescribed?  no  Does the patient measure his/her own blood pressure or blood glucose at home?  no   Does the patient have any problems obtaining medications due to transportation or finances?   no  Understanding of regimen: excellent Understanding of indications: excellent Potential of compliance: excellent   Dolphus Jenny 05/14/2013 8:51 AM

## 2013-05-20 ENCOUNTER — Encounter (HOSPITAL_COMMUNITY)
Admission: RE | Admit: 2013-05-20 | Discharge: 2013-05-20 | Disposition: A | Discharge status: Home / Self Care | Payer: BC Managed Care – PPO | Source: Ambulatory Visit | Attending: Internal Medicine | Admitting: Internal Medicine

## 2013-05-20 NOTE — Progress Notes (Signed)
Pt started cardiac rehab today.  Pt tolerated light exercise without difficulty. Telemetry rhythm Sinus without ectopy. Vital signs stable. Will continue to monitor the patient throughout  the program.  

## 2013-05-27 ENCOUNTER — Encounter (HOSPITAL_COMMUNITY)
Admission: RE | Admit: 2013-05-27 | Discharge: 2013-05-27 | Disposition: A | Discharge status: Home / Self Care | Payer: BC Managed Care – PPO | Source: Ambulatory Visit | Attending: Internal Medicine | Admitting: Internal Medicine

## 2013-05-27 DIAGNOSIS — Z5189 Encounter for other specified aftercare: Secondary | ICD-10-CM | POA: Insufficient documentation

## 2013-05-27 DIAGNOSIS — I2510 Atherosclerotic heart disease of native coronary artery without angina pectoris: Secondary | ICD-10-CM | POA: Insufficient documentation

## 2013-05-27 DIAGNOSIS — I214 Non-ST elevation (NSTEMI) myocardial infarction: Secondary | ICD-10-CM | POA: Insufficient documentation

## 2013-05-29 ENCOUNTER — Encounter (HOSPITAL_COMMUNITY)
Admission: RE | Admit: 2013-05-29 | Discharge: 2013-05-29 | Disposition: A | Discharge status: Home / Self Care | Payer: BC Managed Care – PPO | Source: Ambulatory Visit | Attending: Internal Medicine | Admitting: Internal Medicine

## 2013-05-29 NOTE — Progress Notes (Signed)
Reviewed home exercise with pt today.  Pt plans to walk, do chair exercises, and use dual ergometer at home for exercise.  Reviewed THR, pulse, RPE, sign and symptoms, NTG use, and when to call 911 or MD.  Pt voiced understanding. Fabio Pierce, MA, ACSM RCEP

## 2013-06-03 ENCOUNTER — Encounter (HOSPITAL_COMMUNITY)
Admission: RE | Admit: 2013-06-03 | Discharge: 2013-06-03 | Disposition: A | Discharge status: Home / Self Care | Payer: BC Managed Care – PPO | Source: Ambulatory Visit | Attending: Internal Medicine | Admitting: Internal Medicine

## 2013-06-05 ENCOUNTER — Encounter (HOSPITAL_COMMUNITY)
Admission: RE | Admit: 2013-06-05 | Discharge: 2013-06-05 | Disposition: A | Discharge status: Home / Self Care | Payer: BC Managed Care – PPO | Source: Ambulatory Visit | Attending: Internal Medicine | Admitting: Internal Medicine

## 2013-06-10 ENCOUNTER — Encounter (HOSPITAL_COMMUNITY)
Admission: RE | Admit: 2013-06-10 | Discharge: 2013-06-10 | Disposition: A | Discharge status: Home / Self Care | Payer: BC Managed Care – PPO | Source: Ambulatory Visit | Attending: Internal Medicine | Admitting: Internal Medicine

## 2013-06-12 ENCOUNTER — Encounter (HOSPITAL_COMMUNITY)
Admission: RE | Admit: 2013-06-12 | Discharge: 2013-06-12 | Disposition: A | Discharge status: Home / Self Care | Payer: BC Managed Care – PPO | Source: Ambulatory Visit | Attending: Internal Medicine | Admitting: Internal Medicine

## 2013-06-17 ENCOUNTER — Encounter (HOSPITAL_COMMUNITY): Payer: BC Managed Care – PPO

## 2013-06-24 ENCOUNTER — Encounter (HOSPITAL_COMMUNITY): Payer: BC Managed Care – PPO

## 2013-06-26 ENCOUNTER — Encounter (HOSPITAL_COMMUNITY)
Admission: RE | Admit: 2013-06-26 | Discharge: 2013-06-26 | Disposition: A | Discharge status: Home / Self Care | Payer: BC Managed Care – PPO | Source: Ambulatory Visit | Attending: Internal Medicine | Admitting: Internal Medicine

## 2013-06-26 DIAGNOSIS — Z5189 Encounter for other specified aftercare: Secondary | ICD-10-CM | POA: Insufficient documentation

## 2013-06-26 DIAGNOSIS — I2510 Atherosclerotic heart disease of native coronary artery without angina pectoris: Secondary | ICD-10-CM | POA: Insufficient documentation

## 2013-06-26 DIAGNOSIS — I214 Non-ST elevation (NSTEMI) myocardial infarction: Secondary | ICD-10-CM | POA: Insufficient documentation

## 2013-07-01 ENCOUNTER — Encounter (HOSPITAL_COMMUNITY)
Admission: RE | Admit: 2013-07-01 | Discharge: 2013-07-01 | Disposition: A | Discharge status: Home / Self Care | Payer: BC Managed Care – PPO | Source: Ambulatory Visit | Attending: Internal Medicine | Admitting: Internal Medicine

## 2013-07-01 NOTE — Progress Notes (Signed)
Morgan Torres 63 y.o. female Nutrition Note Spoke with pt.  Nutrition Plan and Nutrition Survey goals reviewed with pt. Pt is following Step 1 of the Therapeutic Lifestyle Changes diet. Pt wants to lose wt. Pt has been trying to lose wt by drinking Herbal Life shakes 2 meals/day and 1 regular meal plus Herbal Life snacks throughout the day prn. Pt states she has followed the Herbal Life weight loss program previously and was successful. Per pt, "I gained the wt back because I had back problems and a bowel obstruction." Fad diets discussed. Wt loss tips reviewed. According to pt's most recent A1c, pt is pre-diabetic. Pre-diabetes discussed. Pt expressed understanding of the information reviewed. Pt aware of nutrition education classes offered and is unable to attend nutrition classes.  Nutrition Diagnosis   Food-and nutrition-related knowledge deficit related to lack of exposure to information as related to diagnosis of: ? CVD ? Pre-DM (A1c 6.0) ?   Obesity related to excessive energy intake as evidenced by a BMI of 33.3  Nutrition RX/ Estimated Daily Nutrition Needs for: wt loss  1250-1750 Kcal, 35-45 gm fat, 8-13 gm sat fat, 1.2-1.8 gm trans-fat, <1500 mg sodium   Nutrition Intervention   Pt's individual nutrition plan including cholesterol goals reviewed with pt.   Benefits of adopting Therapeutic Lifestyle Changes discussed when Medficts reviewed.   Pt to attend the Portion Distortion class   Pt given handouts for: ? Nutrition I class ? Nutrition II class    Continue client-centered nutrition education by RD, as part of interdisciplinary care.  Goal(s)   Pt to identify and limit food sources of saturated fat, trans fat, and cholesterol   Pt to identify food quantities necessary to achieve: ? wt loss to a goal wt of 196-214 lb (89.1-97.3 kg) at graduation from cardiac rehab.   Monitor and Evaluate progress toward nutrition goal with team. Nutrition Risk:  Low   Derek Mound, M.Ed, RD, LDN,  CDE 07/01/2013 3:42 PM

## 2013-07-03 ENCOUNTER — Encounter (HOSPITAL_COMMUNITY)
Admission: RE | Admit: 2013-07-03 | Discharge: 2013-07-03 | Disposition: A | Discharge status: Home / Self Care | Payer: BC Managed Care – PPO | Source: Ambulatory Visit | Attending: Internal Medicine | Admitting: Internal Medicine

## 2013-07-08 ENCOUNTER — Encounter (HOSPITAL_COMMUNITY): Payer: BC Managed Care – PPO

## 2013-07-10 ENCOUNTER — Encounter (HOSPITAL_COMMUNITY): Payer: BC Managed Care – PPO

## 2013-07-15 ENCOUNTER — Encounter (HOSPITAL_COMMUNITY)
Admission: RE | Admit: 2013-07-15 | Discharge: 2013-07-15 | Disposition: A | Discharge status: Home / Self Care | Payer: BC Managed Care – PPO | Source: Ambulatory Visit | Attending: Internal Medicine | Admitting: Internal Medicine

## 2013-07-17 ENCOUNTER — Encounter (HOSPITAL_COMMUNITY): Payer: BC Managed Care – PPO

## 2013-07-20 ENCOUNTER — Ambulatory Visit: Payer: BC Managed Care – PPO | Admitting: Internal Medicine

## 2013-07-22 ENCOUNTER — Encounter (HOSPITAL_COMMUNITY): Payer: BC Managed Care – PPO

## 2013-07-24 ENCOUNTER — Encounter (HOSPITAL_COMMUNITY)
Admission: RE | Admit: 2013-07-24 | Discharge: 2013-07-24 | Disposition: A | Discharge status: Home / Self Care | Payer: BC Managed Care – PPO | Source: Ambulatory Visit | Attending: Internal Medicine | Admitting: Internal Medicine

## 2013-07-27 ENCOUNTER — Other Ambulatory Visit: Payer: Self-pay | Admitting: Sports Medicine

## 2013-07-27 DIAGNOSIS — M25512 Pain in left shoulder: Secondary | ICD-10-CM

## 2013-07-29 ENCOUNTER — Encounter (HOSPITAL_COMMUNITY): Payer: BC Managed Care – PPO

## 2013-07-31 ENCOUNTER — Encounter (HOSPITAL_COMMUNITY)
Admission: RE | Admit: 2013-07-31 | Discharge: 2013-07-31 | Disposition: A | Discharge status: Home / Self Care | Payer: BC Managed Care – PPO | Source: Ambulatory Visit | Attending: Internal Medicine | Admitting: Internal Medicine

## 2013-07-31 DIAGNOSIS — I2510 Atherosclerotic heart disease of native coronary artery without angina pectoris: Secondary | ICD-10-CM | POA: Insufficient documentation

## 2013-07-31 DIAGNOSIS — I214 Non-ST elevation (NSTEMI) myocardial infarction: Secondary | ICD-10-CM | POA: Insufficient documentation

## 2013-07-31 DIAGNOSIS — Z5189 Encounter for other specified aftercare: Secondary | ICD-10-CM | POA: Insufficient documentation

## 2013-08-04 ENCOUNTER — Ambulatory Visit
Admission: RE | Admit: 2013-08-04 | Discharge: 2013-08-04 | Disposition: A | Discharge status: Home / Self Care | Payer: BC Managed Care – PPO | Source: Ambulatory Visit | Attending: Sports Medicine | Admitting: Sports Medicine

## 2013-08-04 DIAGNOSIS — M25512 Pain in left shoulder: Secondary | ICD-10-CM

## 2013-08-05 ENCOUNTER — Encounter (HOSPITAL_COMMUNITY)
Admission: RE | Admit: 2013-08-05 | Discharge: 2013-08-05 | Disposition: A | Discharge status: Home / Self Care | Payer: BC Managed Care – PPO | Source: Ambulatory Visit | Attending: Internal Medicine | Admitting: Internal Medicine

## 2013-08-06 ENCOUNTER — Ambulatory Visit (INDEPENDENT_AMBULATORY_CARE_PROVIDER_SITE_OTHER): Payer: BC Managed Care – PPO | Admitting: Internal Medicine

## 2013-08-06 ENCOUNTER — Encounter: Payer: Self-pay | Admitting: Internal Medicine

## 2013-08-06 VITALS — BP 110/70 | HR 66 | Ht 68.0 in | Wt 223.4 lb

## 2013-08-06 DIAGNOSIS — I5181 Takotsubo syndrome: Secondary | ICD-10-CM

## 2013-08-06 DIAGNOSIS — E785 Hyperlipidemia, unspecified: Secondary | ICD-10-CM

## 2013-08-06 DIAGNOSIS — I10 Essential (primary) hypertension: Secondary | ICD-10-CM

## 2013-08-06 DIAGNOSIS — I214 Non-ST elevation (NSTEMI) myocardial infarction: Secondary | ICD-10-CM

## 2013-08-06 DIAGNOSIS — F411 Generalized anxiety disorder: Secondary | ICD-10-CM

## 2013-08-06 NOTE — Progress Notes (Signed)
OFFICE NOTE  Chief Complaint:  Follow-up  Primary Care Physician: Morgan Font, MD  HPI:  Morgan Torres is a 63 y.o. female smoker with HTN admitted 03/29/13 with [place] and later ruled in for a NSTEMI with a Troponin peak of 1.5. Cath done 03/30/13 revealed angiographically only mild to moderate CAD, with no obvious "Culprit Lesion" for NSTEMI. She also had bnormal LV Gram findings with WMA suggestive of Takotsubo cardiomyopathy. Echo revealed an EF of 55% with no obvious WMA. The plan is for medical Rx. She has been counseled on smoking cessation. It should be noted that she also developed post NSTEMI anterior TWI on EKG. She followed up with Morgan Fuller, PA-C a few months ago and reported doing good. She has not smoked since being discharged. She had some mild CP with day after discharge but none since. She currently denies nausea, vomiting, fever, shortness of breath, orthopnea, dizziness, PND, cough, congestion, abdominal pain, hematochezia, melena, lower extremity edema.  Today she continues to do well. As reported her EF has improved to normal. She's not any more episodes of chest pain. She has managed to lose a little bit of weight and is continuing in cardiac rehabilitation. She is interested in a repeat cholesterol check today. She has made significant dietary changes and is using an herbal life dietary supplement.  PMHx:  Past Medical History  Diagnosis Date  . Lumbar herniated disc   . Sciatica   . Hypertension   . Small bowel obstruction   . COPD (chronic obstructive pulmonary disease)   . Arthritis   . Depression   . Anxiety   . Bronchitis     Hx: of  . Pneumonia     Hx: of  . Fibromyalgia     Past Surgical History  Procedure Laterality Date  . Abdominal surgery      resection of small intestine  . Back surgery    . Abdominal hysterectomy    . Tonsillectomy    . Dilation and curettage of uterus    . Colonoscopy      Hx; of  . Hernia repair      FAMHx:    Family History  Problem Relation Age of Onset  . Hypertension Mother   . Diabetes Mother   . Cancer - Other Mother   . Cancer Sister     SOCHx:   reports that she has been smoking Cigarettes.  She has a 25 pack-year smoking history. She has never used smokeless tobacco. She reports that she drinks alcohol. She reports that she does not use illicit drugs.  ALLERGIES:  Allergies  Allergen Reactions  . Diclofenac Anaphylaxis  . Codeine Itching  . Hydrocodone Itching    ROS: A comprehensive review of systems was negative.  HOME MEDS: Current Outpatient Prescriptions  Medication Sig Dispense Refill  . acetaminophen (TYLENOL) 325 MG tablet Take 2 tablets (650 mg total) by mouth every 4 (four) hours as needed.      Marland Kitchen albuterol (PROVENTIL HFA;VENTOLIN HFA) 108 (90 BASE) MCG/ACT inhaler Inhale 1-2 puffs into the lungs every 6 (six) hours as needed for wheezing or shortness of breath.      Marland Kitchen amLODipine (NORVASC) 5 MG tablet Take 1 tablet (5 mg total) by mouth daily.  30 tablet  11  . aspirin EC 81 MG EC tablet Take 1 tablet (81 mg total) by mouth daily.      Marland Kitchen atorvastatin (LIPITOR) 40 MG tablet Take 1 tablet (40 mg total)  by mouth daily at 6 PM.  30 tablet  11  . EPINEPHrine (EPIPEN 2-PAK) 0.3 mg/0.3 mL DEVI Inject 0.3 mLs (0.3 mg total) into the muscle once as needed (for severe allergic reaction). CAll 911 immediately if you have to use this medicine  1 Device  1  . gabapentin (NEURONTIN) 100 MG capsule Take 100 mg by mouth at bedtime.      Marland Kitchen LORazepam (ATIVAN) 1 MG tablet Take 1 mg by mouth daily as needed for anxiety.      Marland Kitchen losartan-hydrochlorothiazide (HYZAAR) 100-12.5 MG per tablet Take 1 tablet by mouth daily.      . metoprolol tartrate (LOPRESSOR) 25 MG tablet Take 12.5 mg by mouth 2 (two) times daily.      . nicotine (NICODERM CQ - DOSED IN MG/24 HOURS) 21 mg/24hr patch Place 1 patch onto the skin daily.      . nitroGLYCERIN (NITROSTAT) 0.4 MG SL tablet Place 1 tablet (0.4 mg  total) under the tongue every 5 (five) minutes x 3 doses as needed for chest pain.  25 tablet  2  . OVER THE COUNTER MEDICATION daily. Herbal Life - Dietary Supplement      . traMADol (ULTRAM) 50 MG tablet Take 50 mg by mouth every 4 (four) hours as needed for pain.       No current facility-administered medications for this visit.    LABS/IMAGING: No results found for this or any previous visit (from the past 48 hour(s)). Mr Shoulder Left Wo Contrast  08/05/2013   CLINICAL DATA:  Left shoulder pain for 4 months.  No known injury.  EXAM: MRI OF THE LEFT SHOULDER WITHOUT CONTRAST  TECHNIQUE: Multiplanar, multisequence MR imaging of the shoulder was performed. No intravenous contrast was administered.  COMPARISON:  Plain films left shoulder 05/14/2013.  FINDINGS: Rotator cuff: There is heterogeneously increased T2 signal and thickening of the supraspinatus, infraspinatus and subscapularis tendons consistent with marked tendinopathy but no tear is identified.  Muscles:  Normal in appearance without atrophy or focal lesion.  Biceps long head: Intrasubstance increased T2 signal is seen in the tendon as it passes through the bicipital interval consistent with tendinopathy. The tendon is intact.  Acromioclavicular Joint: Bulky acromioclavicular degenerative change is identified.  Glenohumeral Joint: There is some hyaline cartilage thinning and irregularity consistent with osteoarthritis.  Labrum:  Intact.  Bones: The acromion is type 1 with subacromial spurring. There is no worrisome marrow lesion.  IMPRESSION: Marked appearing rotator cuff and long head of biceps tendinopathy without tear.  Acromioclavicular osteoarthritis.  Subacromial spurring also noted.  Subacromial/subdeltoid bursitis.  Mild glenohumeral osteoarthritis.   Electronically Signed   By: Inge Rise M.D.   On: 08/05/2013 08:43    VITALS: BP 110/70  Pulse 66  Ht 5\' 8"  (1.727 m)  Wt 223 lb 6.4 oz (101.334 kg)  BMI 33.98  kg/m2  EXAM: General appearance: alert and no distress Neck: no carotid bruit and no JVD Lungs: clear to auscultation bilaterally Heart: regular rate and rhythm, S1, S2 normal, no murmur, click, rub or gallop Abdomen: soft, non-tender; bowel sounds normal; no masses,  no organomegaly Extremities: extremities normal, atraumatic, no cyanosis or edema Pulses: 2+ and symmetric Skin: Skin color, texture, turgor normal. No rashes or lesions Neurologic: Grossly normal Psych: Mood, affect normal  EKG: Normal sinus rhythm at 66  ASSESSMENT: 1. Recent end STEMI without any clear culprit lesion on 2. Possible Takatsubo cardiomyopathy 3. Dyslipidemia 4. Hypertension 5. Tobacco abuse  PLAN: 1.  Morgan Torres is doing fairly well with an improvement in her symptoms and energy level. I think exercise is contributing to her success. She is due for repeat lipid profile and we'll check that today. She continues to work on smoking cessation and she reports that recent stresses in her life caused her to start smoking again she is more willing to start to quit.  Plan see back in 6 months and will call her with results of her lipid profile.  Pixie Casino, MD, [facility] Attending Cardiologist [facility]  Samaj Wessells C 08/06/2013, 5:23 PM

## 2013-08-06 NOTE — Patient Instructions (Signed)
Your physician wants you to follow-up in: 6 months. You will receive a reminder letter in the mail two months in advance. If you don't receive a letter, please call our office to schedule the follow-up appointment.  Please have fasting blood work at your earliest convenience to check your cholesterol. We will call you with the results.

## 2013-08-07 ENCOUNTER — Encounter (HOSPITAL_COMMUNITY): Payer: BC Managed Care – PPO

## 2013-08-10 ENCOUNTER — Encounter (HOSPITAL_COMMUNITY): Payer: BC Managed Care – PPO

## 2013-08-12 ENCOUNTER — Encounter (HOSPITAL_COMMUNITY): Payer: BC Managed Care – PPO

## 2013-08-14 ENCOUNTER — Encounter (HOSPITAL_COMMUNITY): Payer: BC Managed Care – PPO

## 2013-08-17 ENCOUNTER — Encounter (HOSPITAL_COMMUNITY): Payer: BC Managed Care – PPO

## 2013-08-19 ENCOUNTER — Encounter (HOSPITAL_COMMUNITY): Payer: BC Managed Care – PPO

## 2013-08-21 ENCOUNTER — Encounter (HOSPITAL_COMMUNITY): Payer: BC Managed Care – PPO

## 2013-08-24 ENCOUNTER — Encounter (HOSPITAL_COMMUNITY): Payer: BC Managed Care – PPO

## 2013-08-26 ENCOUNTER — Encounter (HOSPITAL_COMMUNITY)
Admission: RE | Admit: 2013-08-26 | Discharge: 2013-08-26 | Disposition: A | Discharge status: Home / Self Care | Payer: BC Managed Care – PPO | Source: Ambulatory Visit | Attending: Internal Medicine | Admitting: Internal Medicine

## 2013-08-26 ENCOUNTER — Ambulatory Visit (HOSPITAL_COMMUNITY)
Admission: RE | Admit: 2013-08-26 | Discharge: 2013-08-26 | Disposition: A | Discharge status: Home / Self Care | Payer: BC Managed Care – PPO | Source: Ambulatory Visit | Attending: Internal Medicine | Admitting: Internal Medicine

## 2013-08-26 ENCOUNTER — Telehealth: Payer: Self-pay | Admitting: Cardiology

## 2013-08-26 DIAGNOSIS — I214 Non-ST elevation (NSTEMI) myocardial infarction: Secondary | ICD-10-CM | POA: Insufficient documentation

## 2013-08-26 DIAGNOSIS — I2510 Atherosclerotic heart disease of native coronary artery without angina pectoris: Secondary | ICD-10-CM | POA: Insufficient documentation

## 2013-08-26 DIAGNOSIS — Z5189 Encounter for other specified aftercare: Secondary | ICD-10-CM | POA: Insufficient documentation

## 2013-08-26 DIAGNOSIS — R079 Chest pain, unspecified: Secondary | ICD-10-CM | POA: Insufficient documentation

## 2013-08-26 NOTE — Progress Notes (Signed)
Morgan Torres reported feeling "hot" on the treadmill today at cardiac rehab. Exercise stopped patient reported having some chest tightness and pain rated a 2 on a 1-10 scale. Blood pressure 108/60. Oxygen saturation 97% on room air. Patient taken to the treatment room to lie down and placed on 4l/min.  12 lead ECG obtained sinus brady 47. Morgan Torres called and notified.  CBG 103.  Morgan Torres reported having no further chest tightness or pain five minutes later.  Morgan Henri reviewed the 12 lead ECG and said  that  Morgan Torres can go home and may return to exercise on Friday.  If the patient reports any more chest pain during exercise she is to be scheduled for office follow up. I walked the patient around the gym off oxygen with no reproduction of her symptoms.  Morgan Torres had her inhaler with her which she took. Exit blood pressure 108/60 heart rate 63. Morgan Torres said she did have a cigarette this morning and recently had a cold. Will continue to monitor the patient throughout  the program.

## 2013-08-26 NOTE — Telephone Encounter (Signed)
Was notified by Cardiac Rehab RN that patient developed chest tightness today while ambulating on the treadmill.   She is a 63 y/o female followed by Dr. Debara Pickett. She was admitted on 03/29/13 with [place] and later ruled in for a NSTEMI with a Troponin peak of 1.5. Cath done 03/30/13 revealed angiographically only mild to moderate CAD, with no obvious "Culprit Lesion" for NSTEMI. She also had abnormal LV Gram findings with WMA suggestive of Takotsubo cardiomyopathy. Echo revealed an EF of 55% with no obvious WMA. The plan was for medical Rx.  It should be noted that she also developed post NSTEMI anterior TWI on EKG. Since that time she has continued to smoke and has a diagnosis of COPD.   The Cardiac Rehab RN notes that the patient's chest discomfort resolved after administration of oxygen. An EKG was obtained. I personally reviewed the EKG in Epic. Slight TWIs are noted in lead V1 but, as mentioned above, this is not a new finding. No other ischemic changes were noted. Her EKG also demonstrates bradycardia w/ a HR of 47. However, previous EKG also  demonstrated a HR of 46 bpm. Her most recent vitals check in rehab revealed a HR of 63 bpm. BP also stable.   The patient denies further chest discomfort. Will send home from rehab. She is to return in 2 days. If she continues to have recurrent CP, RN advised to call office for f/u visit with an extender. It is also likely that her symptoms may be due to COPD.   Consuelo Pandy, PA-C

## 2013-08-27 LAB — GLUCOSE, CAPILLARY: Glucose-Capillary: 103 mg/dL — ABNORMAL HIGH (ref 70–99)

## 2013-08-28 ENCOUNTER — Telehealth (HOSPITAL_COMMUNITY): Payer: Self-pay | Admitting: Family Medicine

## 2013-08-28 ENCOUNTER — Encounter (HOSPITAL_COMMUNITY): Payer: BC Managed Care – PPO

## 2013-08-31 ENCOUNTER — Encounter (HOSPITAL_COMMUNITY): Payer: BC Managed Care – PPO

## 2013-09-02 ENCOUNTER — Encounter (HOSPITAL_COMMUNITY)
Admission: RE | Admit: 2013-09-02 | Discharge: 2013-09-02 | Disposition: A | Discharge status: Home / Self Care | Payer: BC Managed Care – PPO | Source: Ambulatory Visit | Attending: Internal Medicine | Admitting: Internal Medicine

## 2013-09-04 ENCOUNTER — Encounter (HOSPITAL_COMMUNITY): Payer: BC Managed Care – PPO

## 2013-09-07 ENCOUNTER — Encounter (HOSPITAL_COMMUNITY): Payer: BC Managed Care – PPO

## 2013-09-09 ENCOUNTER — Encounter (HOSPITAL_COMMUNITY)
Admission: RE | Admit: 2013-09-09 | Discharge: 2013-09-09 | Disposition: A | Discharge status: Home / Self Care | Payer: BC Managed Care – PPO | Source: Ambulatory Visit | Attending: Internal Medicine | Admitting: Internal Medicine

## 2013-09-11 ENCOUNTER — Encounter (HOSPITAL_COMMUNITY)
Admission: RE | Admit: 2013-09-11 | Discharge: 2013-09-11 | Disposition: A | Discharge status: Home / Self Care | Payer: BC Managed Care – PPO | Source: Ambulatory Visit | Attending: Internal Medicine | Admitting: Internal Medicine

## 2013-09-14 ENCOUNTER — Encounter (HOSPITAL_COMMUNITY): Payer: BC Managed Care – PPO

## 2013-09-16 ENCOUNTER — Encounter (HOSPITAL_COMMUNITY)
Admission: RE | Admit: 2013-09-16 | Discharge: 2013-09-16 | Disposition: A | Discharge status: Home / Self Care | Payer: BC Managed Care – PPO | Source: Ambulatory Visit | Attending: Internal Medicine | Admitting: Internal Medicine

## 2013-09-17 ENCOUNTER — Ambulatory Visit: Payer: Self-pay | Admitting: Podiatry

## 2013-09-18 ENCOUNTER — Encounter (HOSPITAL_COMMUNITY)
Admission: RE | Admit: 2013-09-18 | Discharge: 2013-09-18 | Disposition: A | Discharge status: Home / Self Care | Payer: BC Managed Care – PPO | Source: Ambulatory Visit | Attending: Internal Medicine | Admitting: Internal Medicine

## 2013-09-18 NOTE — Progress Notes (Signed)
Morgan Torres graduates today. Lylee plans to continue exercise on her own. Itzell has a treadmill at home.

## 2013-10-08 ENCOUNTER — Ambulatory Visit (INDEPENDENT_AMBULATORY_CARE_PROVIDER_SITE_OTHER): Payer: BC Managed Care – PPO | Admitting: Podiatry

## 2013-10-08 ENCOUNTER — Ambulatory Visit (INDEPENDENT_AMBULATORY_CARE_PROVIDER_SITE_OTHER): Payer: BC Managed Care – PPO

## 2013-10-08 ENCOUNTER — Encounter: Payer: Self-pay | Admitting: Podiatry

## 2013-10-08 VITALS — BP 124/62 | HR 65 | Resp 16 | Ht 68.0 in | Wt 220.0 lb

## 2013-10-08 DIAGNOSIS — M779 Enthesopathy, unspecified: Secondary | ICD-10-CM

## 2013-10-08 DIAGNOSIS — M2040 Other hammer toe(s) (acquired), unspecified foot: Secondary | ICD-10-CM

## 2013-10-08 DIAGNOSIS — L84 Corns and callosities: Secondary | ICD-10-CM

## 2013-10-08 MED ORDER — TRIAMCINOLONE ACETONIDE 10 MG/ML IJ SUSP
10.0000 mg | Freq: Once | INTRAMUSCULAR | Status: AC
  Administered 2013-10-08: 10 mg

## 2013-10-08 NOTE — Progress Notes (Signed)
   Subjective:    Patient ID: Morgan Torres, female    DOB: 09/06/1950, 63 y.o.   MRN: [id number]  HPI Comments: "I have a little corn on my toes"  Patient c/o aching 4th toe right and 5th toe left for few months. The areas are callused and dark. Shoes are very uncomfortable. She has been using medicated corn pads-no help.     Review of Systems  HENT: Positive for sinus pressure.   Eyes: Positive for pain.  Endocrine: Positive for heat intolerance.  Musculoskeletal: Positive for arthralgias and back pain.  All other systems reviewed and are negative.      Objective:   Physical Exam        Assessment & Plan:

## 2013-10-09 NOTE — Progress Notes (Signed)
Subjective:     Patient ID: Morgan Torres, female   DOB: Feb 24, 1951, 62 y.o.   MRN: [id number]  Toe Pain    patient presents stating I'm having a lot of pain in my fourth toe of my right foot and my fifth toe of my left foot with fluid buildup keratotic lesion formation and inability to wear shoe gear with any degree of comfort. States this is been going on for about 6 months into getting gradually worse   Review of Systems  All other systems reviewed and are negative.      Objective:   Physical Exam  Nursing note and vitals reviewed. Constitutional: She is oriented to person, place, and time.  Cardiovascular: Intact distal pulses.   Musculoskeletal: Normal range of motion.  Neurological: She is oriented to person, place, and time.  Skin: Skin is warm.   neurovascular status intact with muscle strength adequate and range of motion of the subtalar and midtarsal joint within normal limits. Patient is found to have keratotic lesion fourth toe right fifth left better inflamed with fluid buildup of the interphalangeal joint with keratotic tissue formation noted and some rotational component of the toes noted    Assessment:     Hammertoe deformity with inflammatory capsulitis fourth right fifth left    Plan:     H&P and x-rays reviewed and did careful interphalangeal injection right fourth left fifth toe and debrided tissue and applied padding. Explained ultimately this may require digital correction and explained arthroplasty procedure and reappoint as

## 2013-12-16 ENCOUNTER — Emergency Department (HOSPITAL_COMMUNITY): Payer: BC Managed Care – PPO

## 2013-12-16 ENCOUNTER — Emergency Department (HOSPITAL_COMMUNITY)
Admission: EM | Admit: 2013-12-16 | Discharge: 2013-12-16 | Disposition: A | Discharge status: Home / Self Care | Payer: Self-pay | Attending: Emergency Medicine | Admitting: Emergency Medicine

## 2013-12-16 ENCOUNTER — Encounter (HOSPITAL_COMMUNITY): Payer: Self-pay | Admitting: Emergency Medicine

## 2013-12-16 DIAGNOSIS — Z79899 Other long term (current) drug therapy: Secondary | ICD-10-CM | POA: Insufficient documentation

## 2013-12-16 DIAGNOSIS — Z8739 Personal history of other diseases of the musculoskeletal system and connective tissue: Secondary | ICD-10-CM | POA: Insufficient documentation

## 2013-12-16 DIAGNOSIS — F3289 Other specified depressive episodes: Secondary | ICD-10-CM | POA: Insufficient documentation

## 2013-12-16 DIAGNOSIS — M1612 Unilateral primary osteoarthritis, left hip: Secondary | ICD-10-CM

## 2013-12-16 DIAGNOSIS — F329 Major depressive disorder, single episode, unspecified: Secondary | ICD-10-CM | POA: Insufficient documentation

## 2013-12-16 DIAGNOSIS — Z9889 Other specified postprocedural states: Secondary | ICD-10-CM | POA: Insufficient documentation

## 2013-12-16 DIAGNOSIS — I10 Essential (primary) hypertension: Secondary | ICD-10-CM | POA: Insufficient documentation

## 2013-12-16 DIAGNOSIS — J4489 Other specified chronic obstructive pulmonary disease: Secondary | ICD-10-CM | POA: Insufficient documentation

## 2013-12-16 DIAGNOSIS — N390 Urinary tract infection, site not specified: Secondary | ICD-10-CM | POA: Insufficient documentation

## 2013-12-16 DIAGNOSIS — J449 Chronic obstructive pulmonary disease, unspecified: Secondary | ICD-10-CM | POA: Insufficient documentation

## 2013-12-16 DIAGNOSIS — M169 Osteoarthritis of hip, unspecified: Secondary | ICD-10-CM | POA: Insufficient documentation

## 2013-12-16 DIAGNOSIS — F17200 Nicotine dependence, unspecified, uncomplicated: Secondary | ICD-10-CM | POA: Insufficient documentation

## 2013-12-16 DIAGNOSIS — M1610 Unilateral primary osteoarthritis, unspecified hip: Secondary | ICD-10-CM | POA: Insufficient documentation

## 2013-12-16 DIAGNOSIS — IMO0001 Reserved for inherently not codable concepts without codable children: Secondary | ICD-10-CM | POA: Insufficient documentation

## 2013-12-16 DIAGNOSIS — F411 Generalized anxiety disorder: Secondary | ICD-10-CM | POA: Insufficient documentation

## 2013-12-16 DIAGNOSIS — Z8701 Personal history of pneumonia (recurrent): Secondary | ICD-10-CM | POA: Insufficient documentation

## 2013-12-16 DIAGNOSIS — Z8719 Personal history of other diseases of the digestive system: Secondary | ICD-10-CM | POA: Insufficient documentation

## 2013-12-16 LAB — URINALYSIS, ROUTINE W REFLEX MICROSCOPIC
BILIRUBIN URINE: NEGATIVE
GLUCOSE, UA: NEGATIVE mg/dL
HGB URINE DIPSTICK: NEGATIVE
Ketones, ur: NEGATIVE mg/dL
Nitrite: POSITIVE — AB
PH: 5.5 (ref 5.0–8.0)
Protein, ur: NEGATIVE mg/dL
SPECIFIC GRAVITY, URINE: 1.02 (ref 1.005–1.030)
Urobilinogen, UA: 0.2 mg/dL (ref 0.0–1.0)

## 2013-12-16 LAB — URINE MICROSCOPIC-ADD ON

## 2013-12-16 MED ORDER — SULFAMETHOXAZOLE-TRIMETHOPRIM 800-160 MG PO TABS: 1.0000 | ORAL_TABLET | Freq: Two times a day (BID) | ORAL | Status: DC

## 2013-12-16 MED ORDER — OXYCODONE-ACETAMINOPHEN 5-325 MG PO TABS
1.0000 | ORAL_TABLET | Freq: Once | ORAL | Status: AC
  Administered 2013-12-16: 1 via ORAL
  Filled 2013-12-16: qty 1

## 2013-12-16 NOTE — ED Notes (Signed)
Pt reports pain to left side of her back and left hip pain. Pain increases with movement. Hx of back problems but denies new injury to back. Denies any urinary symptoms.

## 2013-12-16 NOTE — ED Provider Notes (Signed)
CSN: [id number]     Arrival date & time 12/16/13  1220 History   First MD Initiated Contact with Patient 12/16/13 1245     Chief Complaint  Patient presents with  . Back Pain  . Hip Pain     (Consider location/radiation/quality/duration/timing/severity/associated sxs/prior Treatment) HPI Comments: Morgan Torres is a 63 y.o. Female with a PMHx of lumbar herniated disc s/p fusion, fibromyalgia, sciatica, and arthritis who presents today with 10/10 throbbing, non-radiating L hip/side pain that began to hurt yesterday and kept her from sleeping well last night. Increases with movement, better with rest and heat. Tramadol and tylenol has not helped much. Denies any known injury or fall, states she's never had hip pain before although she has had back pain that doesn't feel the same as today. Denies paresthesias, weakness, or cauda equina symptoms. Denies urinary symptoms. Denies rash, fever/chills, abd pain, N/V/D/C, CP, SOB, HA, neck pain, cough, myalgias, or other arthralgias.  Patient is a 63 y.o. female presenting with hip pain. The history is provided by the patient. No language interpreter was used.  Hip Pain This is a new problem. The current episode started yesterday. The problem occurs constantly. The problem has been unchanged. Pertinent negatives include no abdominal pain, arthralgias, change in bowel habit, chest pain, chills, coughing, fever, headaches, joint swelling, myalgias, nausea, neck pain, numbness, rash, urinary symptoms, vomiting or weakness. The symptoms are aggravated by bending and twisting. She has tried acetaminophen, heat, oral narcotics and lying down for the symptoms. The treatment provided mild relief.    Past Medical History  Diagnosis Date  . Lumbar herniated disc   . Sciatica   . Hypertension   . Small bowel obstruction   . COPD (chronic obstructive pulmonary disease)   . Arthritis   . Depression   . Anxiety   . Bronchitis     Hx: of  . Pneumonia     Hx:  of  . Fibromyalgia    Past Surgical History  Procedure Laterality Date  . Abdominal surgery      resection of small intestine  . Back surgery    . Abdominal hysterectomy    . Tonsillectomy    . Dilation and curettage of uterus    . Colonoscopy      Hx; of  . Hernia repair     Family History  Problem Relation Age of Onset  . Hypertension Mother   . Diabetes Mother   . Cancer - Other Mother   . Cancer Sister    History  Substance Use Topics  . Smoking status: Current Every Day Smoker -- 1.00 packs/day for 25 years    Types: Cigarettes    Last Attempt to Quit: 03/29/2013  . Smokeless tobacco: Never Used     Comment: Currently on the Nicoderm patch."smokes a few"  . Alcohol Use: Yes     Comment: 1 glass of wine occas. (1 every 2 weeks or so)   OB History   Grav Para Term Preterm Abortions TAB SAB Ect Mult Living                 Review of Systems  Constitutional: Negative for fever, chills and appetite change.  Eyes: Negative for pain and redness.  Respiratory: Negative for cough and shortness of breath.   Cardiovascular: Negative for chest pain and leg swelling.  Gastrointestinal: Negative for nausea, vomiting, abdominal pain, diarrhea, constipation, blood in stool and change in bowel habit.  Genitourinary: Positive for flank pain (near  L hip). Negative for dysuria, urgency, frequency, hematuria, decreased urine volume, vaginal discharge, difficulty urinating and vaginal pain.  Musculoskeletal: Positive for back pain. Negative for arthralgias, gait problem, joint swelling, myalgias, neck pain and neck stiffness.  Skin: Negative for rash.  Neurological: Negative for dizziness, weakness, numbness and headaches.  Psychiatric/Behavioral: Negative for confusion.  10 Systems reviewed and are negative for acute change except as noted in the HPI.     Allergies  Diclofenac; Codeine; and Hydrocodone  Home Medications   Prior to Admission medications   Medication Sig Start  Date End Date Taking? Authorizing Cira Deyoe  acetaminophen (TYLENOL) 325 MG tablet Take 2 tablets (650 mg total) by mouth every 4 (four) hours as needed. 04/01/13  Yes Luke K Kilroy, PA-C  albuterol (PROVENTIL HFA;VENTOLIN HFA) 108 (90 BASE) MCG/ACT inhaler Inhale 1-2 puffs into the lungs every 6 (six) hours as needed for wheezing or shortness of breath. 05/11/12  Yes Harden Mo, MD  amLODipine (NORVASC) 5 MG tablet Take 1 tablet (5 mg total) by mouth daily. 04/01/13  Yes Erlene Quan, PA-C  aspirin EC 81 MG EC tablet Take 1 tablet (81 mg total) by mouth daily. 04/01/13  Yes Luke K Kilroy, PA-C  atorvastatin (LIPITOR) 40 MG tablet Take 1 tablet (40 mg total) by mouth daily at 6 PM. 04/01/13  Yes Erlene Quan, PA-C  gabapentin (NEURONTIN) 100 MG capsule Take 100 mg by mouth at bedtime.   Yes Historical Berdella Bacot, MD  LORazepam (ATIVAN) 1 MG tablet Take 1 mg by mouth daily as needed for anxiety.   Yes Historical Khadijatou Borak, MD  losartan-hydrochlorothiazide (HYZAAR) 100-12.5 MG per tablet Take 1 tablet by mouth daily.   Yes Historical Kilan Banfill, MD  metoprolol tartrate (LOPRESSOR) 25 MG tablet Take 12.5 mg by mouth 2 (two) times daily.   Yes Historical Katalin Colledge, MD  nicotine (NICODERM CQ - DOSED IN MG/24 HOURS) 21 mg/24hr patch Place 1 patch onto the skin daily.   Yes Historical Adriane Guglielmo, MD  OVER THE COUNTER MEDICATION daily. Herbal Life - Dietary Supplement   Yes Historical Richardine Peppers, MD  traMADol (ULTRAM) 50 MG tablet Take 50 mg by mouth every 4 (four) hours as needed for pain.   Yes Historical Ayvin Lipinski, MD  EPINEPHrine (EPIPEN 2-PAK) 0.3 mg/0.3 mL DEVI Inject 0.3 mLs (0.3 mg total) into the muscle once as needed (for severe allergic reaction). CAll 911 immediately if you have to use this medicine 11/26/12   Anderson Malta L Piepenbrink, PA-C  nitroGLYCERIN (NITROSTAT) 0.4 MG SL tablet Place 1 tablet (0.4 mg total) under the tongue every 5 (five) minutes x 3 doses as needed for chest pain. 04/01/13   Erlene Quan,  PA-C  sulfamethoxazole-trimethoprim (BACTRIM DS,SEPTRA DS) 800-160 MG per tablet Take 1 tablet by mouth 2 (two) times daily. 12/16/13   Mercedes Strupp Camprubi-Soms, PA-C   BP 132/61  Pulse 58  Temp(Src) 97.9 F (36.6 C) (Oral)  Resp 16  Ht 5\' 8"  (1.727 m)  Wt 225 lb (102.059 kg)  BMI 34.22 kg/m2  SpO2 99% Physical Exam  Nursing note and vitals reviewed. Constitutional: She is oriented to person, place, and time. Vital signs are normal. She appears well-developed and well-nourished. No distress.  HENT:  Head: Normocephalic.  Mouth/Throat: Mucous membranes are normal.  MMM  Eyes: Conjunctivae and EOM are normal.  Neck: Normal range of motion. Neck supple. No spinous process tenderness and no muscular tenderness present. No rigidity. Normal range of motion present.  Cardiovascular: Normal rate, regular rhythm,  normal heart sounds and intact distal pulses.   Pulmonary/Chest: Effort normal.  Abdominal: Soft. Normal appearance and bowel sounds are normal. She exhibits no distension. There is no tenderness. There is no rigidity, no rebound, no guarding and no CVA tenderness.  No CVA tenderness  Musculoskeletal: She exhibits tenderness.       Left hip: She exhibits tenderness. She exhibits normal range of motion, normal strength, no bony tenderness and no swelling.  Diffusely TTP along lateral aspect of L hip and side, no focal TTP. L hip AROM decreased secondary to pain, but passive ROM fully intact. Strength 5/5 bilaterally. No swelling or crepitus, no deformity in L hip. Negative straight leg raise. No spinous process TTP or paraspinous muscle tenderness. Midline lumbar spine scar. No erythema or edema of joints. No flank erythema or bruising.  Neurological: She is alert and oriented to person, place, and time. She has normal strength and normal reflexes. No sensory deficit.  Sensation grossly intact in bilateral lower extremities, strength 5/5 bilaterally. DTRs symmetric bilaterally  Skin:  Skin is warm, dry and intact. No rash noted.  Psychiatric: She has a normal mood and affect.    ED Course  Procedures (including critical care time) Labs Review Labs Reviewed  URINALYSIS, ROUTINE W REFLEX MICROSCOPIC - Abnormal; Notable for the following:    APPearance HAZY (*)    Nitrite POSITIVE (*)    Leukocytes, UA MODERATE (*)    All other components within normal limits  URINE MICROSCOPIC-ADD ON - Abnormal; Notable for the following:    Squamous Epithelial / LPF FEW (*)    Bacteria, UA MANY (*)    All other components within normal limits  URINE CULTURE    Imaging Review Dg Hip Complete Left  12/16/2013   CLINICAL DATA:  Left hip pain  EXAM: LEFT HIP - COMPLETE 2+ VIEW  COMPARISON:  None.  FINDINGS: Mild joint space narrowing and spurring involving the left hip joint. Negative for fracture or AVN. Cystic change in the femoral head compatible with degenerative joint disease.  Lumbar fusion L5-S1. Soft tissue calcifications are present in the left buttock.  IMPRESSION: Degenerative change in the left hip joint of a moderate degree. No acute bony abnormality.   Electronically Signed   By: [name] M.D.   On: 12/16/2013 14:42     EKG Interpretation None      MDM   Final diagnoses:  Urinary tract infection, acute  Osteoarthritis of left hip, unspecified osteoarthritis type     ODEAN MCELWAIN is a 63 y.o. female with PMHx of back pain and arthritis presenting today with L hip/side pain x 1 day, no N/V or fevers/chills, denied urinary symptoms. Exam revealing L hip pain diffusely, no focal TTP, and neurovascularly intact. DDx includes hip arthritis/DJD, AVN, UTI or early pyelonephritis. U/A and hip xray obtained. L hip xray revealing degenerative changes but no acute injury/fx/AVN. U/A revealing hazy nitrite and leuk+ urine with many bacteria. Sent for culture. I believe this hip/side pain is related to a UTI and concerning for early pyelo. Rx for Bactrim DS BID x 7 d. Pt  non-toxic, no N/V, no fever at this time, therefore I felt it was okay to try outpatient therapy. Advised pt to stay well hydrated. Also discussed following up with PCP in 1 week. Discussed further eval by ortho for DJD. I explained the diagnosis and have given explicit precautions to return to the ER including for any other new or worsening symptoms. The patient  understands and accepts the medical plan as it's been dictated and I have answered their questions. Discharge instructions concerning home care and prescriptions have been given. The patient is STABLE and is discharged to home in good condition.  BP 132/61  Pulse 58  Temp(Src) 97.9 F (36.6 C) (Oral)  Resp 16  Ht 5\' 8"  (1.727 m)  Wt 225 lb (102.059 kg)  BMI 34.22 kg/m2  SpO2 99%    [employer], PA-C 12/16/13 1619

## 2013-12-16 NOTE — Discharge Instructions (Signed)
Stay very well hydrated with plenty of water throughout the day. Take antibiotic until completed. You may use pyridium as directed to decrease painful urination but know that a common side effect is to turn your urine a bright orange/red color. This is not a harmful side effect. Follow up with primary care physician in 1 week for recheck of ongoing symptoms but return to ER for emergent changing or worsening of symptoms. Please seek immediate care if you develop the following: You develop increased back pain.  Your symptoms are no better, or worse in 3 days. There is severe back pain or lower abdominal pain.  You develop chills.  You have a fever.  There is nausea or vomiting.  There is continued burning or discomfort with urination.   Urinary Tract Infection A urinary tract infection (UTI) can occur any place along the urinary tract. The tract includes the kidneys, ureters, bladder, and urethra. A type of germ called bacteria often causes a UTI. UTIs are often helped with antibiotic medicine.  HOME CARE   If given, take antibiotics as told by your doctor. Finish them even if you start to feel better.  Drink enough fluids to keep your pee (urine) clear or pale yellow.  Avoid tea, drinks with caffeine, and bubbly (carbonated) drinks.  Pee often. Avoid holding your pee in for a long time.  Pee before and after having sex (intercourse).  Wipe from front to back after you poop (bowel movement) if you are a woman. Use each tissue only once. GET HELP RIGHT AWAY IF:   You have back pain.  You have lower belly (abdominal) pain.  You have chills.  You feel sick to your stomach (nauseous).  You throw up (vomit).  Your burning or discomfort with peeing does not go away.  You have a fever.  Your symptoms are not better in 3 days. MAKE SURE YOU:   Understand these instructions.  Will watch your condition.  Will get help right away if you are not doing well or get worse. Document  Released: 11/28/2007 Document Revised: 03/05/2012 Document Reviewed: 01/10/2012 [facility] Patient Information 2015 [place], [place]. This information is not intended to replace advice given to you by your health care provider. Make sure you discuss any questions you have with your health care provider.  Osteoarthritis Osteoarthritis is a disease that causes soreness and swelling (inflammation) of a joint. It occurs when the cartilage at the affected joint wears down. Cartilage acts as a cushion, covering the ends of bones where they meet to form a joint. Osteoarthritis is the most common form of arthritis. It often occurs in older people. The joints affected most often by this condition include those in the:  Ends of the fingers.  Thumbs.  Neck.  Lower back.  Knees.  Hips. CAUSES  Over time, the cartilage that covers the ends of bones begins to wear away. This causes bone to rub on bone, producing pain and stiffness in the affected joints.  RISK FACTORS Certain factors can increase your chances of having osteoarthritis, including:  Older age.  Excessive body weight.  Overuse of joints. SIGNS AND SYMPTOMS   Pain, swelling, and stiffness in the joint.  Over time, the joint may lose its normal shape.  Small deposits of bone (osteophytes) may grow on the edges of the joint.  Bits of bone or cartilage can break off and float inside the joint space. This may cause more pain and damage. DIAGNOSIS  Your health care provider will  do a physical exam and ask about your symptoms. Various tests may be ordered, such as:  X-rays of the affected joint.  An MRI scan.  Blood tests to rule out other types of arthritis.  Joint fluid tests. This involves using a needle to draw fluid from the joint and examining the fluid under a microscope. TREATMENT  Goals of treatment are to control pain and improve joint function. Treatment plans may include:  A prescribed exercise program that allows  for rest and joint relief.  A weight control plan.  Pain relief techniques, such as:  Properly applied heat and cold.  Electric pulses delivered to nerve endings under the skin (transcutaneous electrical nerve stimulation, TENS).  Massage.  Certain nutritional supplements.  Medicines to control pain, such as:  Acetaminophen.  Nonsteroidal anti-inflammatory drugs (NSAIDs), such as naproxen.  Narcotic or central-acting agents, such as tramadol.  Corticosteroids. These can be given orally or as an injection.  Surgery to reposition the bones and relieve pain (osteotomy) or to remove loose pieces of bone and cartilage. Joint replacement may be needed in advanced states of osteoarthritis. HOME CARE INSTRUCTIONS   Only take over-the-counter or prescription medicines as directed by your health care provider. Take all medicines exactly as instructed.  Maintain a healthy weight. Follow your health care provider's instructions for weight control. This may include dietary instructions.  Exercise as directed. Your health care provider can recommend specific types of exercise. These may include:  Strengthening exercises--These are done to strengthen the muscles that support joints affected by arthritis. They can be performed with weights or with exercise bands to add resistance.  Aerobic activities--These are exercises, such as brisk walking or low-impact aerobics, that get your heart pumping.  Range-of-motion activities--These keep your joints limber.  Balance and agility exercises--These help you maintain daily living skills.  Rest your affected joints as directed by your health care provider.  Follow up with your health care provider as directed. SEEK MEDICAL CARE IF:   Your skin turns red.  You develop a rash in addition to your joint pain.  You have worsening joint pain. SEEK IMMEDIATE MEDICAL CARE IF:  You have a significant loss of weight or appetite.  You have a fever  along with joint or muscle aches.  You have night sweats. [place] of Arthritis and Musculoskeletal and Skin Diseases: www.niams.SouthExposed.es [employer] on Aging: http://kim-miller.com/ American College of Rheumatology: www.rheumatology.org Document Released: 06/11/2005 Document Revised: 04/01/2013 Document Reviewed: 02/16/2013 [facility] Patient Information 2015 [place], [place]. This information is not intended to replace advice given to you by your health care provider. Make sure you discuss any questions you have with your health care provider.

## 2013-12-18 LAB — URINE CULTURE

## 2013-12-18 NOTE — ED Provider Notes (Signed)
Medical screening examination/treatment/procedure(s) were performed by non-physician practitioner and as supervising physician I was immediately available for consultation/collaboration.   EKG Interpretation None        Alfonzo Feller, DO 12/18/13 [phone]

## 2013-12-20 ENCOUNTER — Telehealth (HOSPITAL_BASED_OUTPATIENT_CLINIC_OR_DEPARTMENT_OTHER): Payer: Self-pay

## 2013-12-20 NOTE — Telephone Encounter (Signed)
Post ED Visit - Positive Culture Follow-up  Culture report reviewed by antimicrobial stewardship pharmacist: [x]  Wes Dulaney, Pharm.D., BCPS []  Heide Guile, Pharm.D., BCPS []  Alycia Rossetti, Pharm.D., BCPS []  [place], [place].D., BCPS, AAHIVP []  Legrand Como, Pharm.D., BCPS, AAHIVP []  Juliene Pina, Pharm.D.  Positive Urine culture, >/= 100,000 colonies -> Klebsiella Pneumoniae Treated with Sulfa Trimeth, organism sensitive to the same and no further patient follow-up is required at this time.  Dortha Kern 12/20/2013, 11:08 PM

## 2013-12-30 ENCOUNTER — Encounter: Payer: Self-pay | Admitting: Internal Medicine

## 2014-02-09 ENCOUNTER — Ambulatory Visit: Payer: BC Managed Care – PPO | Admitting: Internal Medicine

## 2014-03-04 ENCOUNTER — Encounter: Payer: Self-pay | Admitting: Internal Medicine

## 2014-03-22 ENCOUNTER — Ambulatory Visit (INDEPENDENT_AMBULATORY_CARE_PROVIDER_SITE_OTHER): Payer: Medicare HMO | Admitting: Internal Medicine

## 2014-03-22 ENCOUNTER — Encounter: Payer: Self-pay | Admitting: Internal Medicine

## 2014-03-22 VITALS — BP 130/74 | HR 68 | Ht 68.0 in | Wt 227.0 lb

## 2014-03-22 DIAGNOSIS — E669 Obesity, unspecified: Secondary | ICD-10-CM

## 2014-03-22 DIAGNOSIS — E785 Hyperlipidemia, unspecified: Secondary | ICD-10-CM

## 2014-03-22 DIAGNOSIS — I10 Essential (primary) hypertension: Secondary | ICD-10-CM

## 2014-03-22 DIAGNOSIS — I5181 Takotsubo syndrome: Secondary | ICD-10-CM

## 2014-03-22 DIAGNOSIS — F17200 Nicotine dependence, unspecified, uncomplicated: Secondary | ICD-10-CM

## 2014-03-22 NOTE — Patient Instructions (Signed)
Your physician wants you to follow-up in: 1 year. You will receive a reminder letter in the mail two months in advance. If you don't receive a letter, please call our office to schedule the follow-up appointment.  

## 2014-03-22 NOTE — Progress Notes (Signed)
OFFICE NOTE  Chief Complaint:  Follow-up  Primary Care Physician: Maggie Font, MD  HPI:  Morgan Torres is a 63 y.o. female smoker with HTN admitted 03/29/13 with [place] and later ruled in for a NSTEMI with a Troponin peak of 1.5. Cath done 03/30/13 revealed angiographically only mild to moderate CAD, with no obvious "Culprit Lesion" for NSTEMI. She also had bnormal LV Gram findings with WMA suggestive of Takotsubo cardiomyopathy. Echo revealed an EF of 55% with no obvious WMA. The plan is for medical Rx. She has been counseled on smoking cessation. It should be noted that she also developed post NSTEMI anterior TWI on EKG. She followed up with [name], PA-C a few months ago and reported doing good. She has not smoked since being discharged. She had some mild CP with day after discharge but none since. She currently denies nausea, vomiting, fever, shortness of breath, orthopnea, dizziness, PND, cough, congestion, abdominal pain, hematochezia, melena, lower extremity edema.  Morgan Torres returns today. She is feeling well from a cardiac standpoint. Recently she has been having problems with her hip and underwent an injection. There is a possibility of surgery however she is not surprisingly not interested in that. Blood pressure is well-controlled. She denies any chest pain or worsening shortness of breath. She says she is working on smoking cessation. She is unable to exercise much due to her hip pain.  PMHx:  Past Medical History  Diagnosis Date  . Lumbar herniated disc   . Sciatica   . Hypertension   . Small bowel obstruction   . COPD (chronic obstructive pulmonary disease)   . Arthritis   . Depression   . Anxiety   . Bronchitis     Hx: of  . Pneumonia     Hx: of  . Fibromyalgia     Past Surgical History  Procedure Laterality Date  . Abdominal surgery      resection of small intestine  . Back surgery    . Abdominal hysterectomy    . Tonsillectomy    . Dilation and  curettage of uterus    . Colonoscopy      Hx; of  . Hernia repair      FAMHx:  Family History  Problem Relation Age of Onset  . Hypertension Mother   . Diabetes Mother   . Cancer - Other Mother   . Cancer Sister     SOCHx:   reports that she has been smoking Cigarettes.  She has a 10 pack-year smoking history. She has never used smokeless tobacco. She reports that she drinks alcohol. She reports that she does not use illicit drugs.  ALLERGIES:  Allergies  Allergen Reactions  . Diclofenac Anaphylaxis  . Codeine Itching  . Hydrocodone Itching    ROS: A comprehensive review of systems was negative except for: Musculoskeletal: positive for stiff joints  HOME MEDS: Current Outpatient Prescriptions  Medication Sig Dispense Refill  . acetaminophen (TYLENOL) 325 MG tablet Take 2 tablets (650 mg total) by mouth every 4 (four) hours as needed.      Marland Kitchen albuterol (PROVENTIL HFA;VENTOLIN HFA) 108 (90 BASE) MCG/ACT inhaler Inhale 1-2 puffs into the lungs every 6 (six) hours as needed for wheezing or shortness of breath.      Marland Kitchen amLODipine (NORVASC) 5 MG tablet Take 1 tablet (5 mg total) by mouth daily.  30 tablet  11  . aspirin EC 81 MG EC tablet Take 1 tablet (81 mg total) by mouth  daily.      . atorvastatin (LIPITOR) 40 MG tablet Take 1 tablet (40 mg total) by mouth daily at 6 PM.  30 tablet  11  . EPINEPHrine (EPIPEN 2-PAK) 0.3 mg/0.3 mL DEVI Inject 0.3 mLs (0.3 mg total) into the muscle once as needed (for severe allergic reaction). CAll 911 immediately if you have to use this medicine  1 Device  1  . gabapentin (NEURONTIN) 100 MG capsule Take 100 mg by mouth at bedtime.      Marland Kitchen ibuprofen (ADVIL,MOTRIN) 800 MG tablet Take 1 tablet by mouth 3 (three) times daily as needed.      Marland Kitchen LORazepam (ATIVAN) 1 MG tablet Take 1 mg by mouth daily as needed for anxiety.      Marland Kitchen losartan-hydrochlorothiazide (HYZAAR) 100-12.5 MG per tablet Take 1 tablet by mouth daily.      . metoprolol tartrate  (LOPRESSOR) 25 MG tablet Take 12.5 mg by mouth 2 (two) times daily.      . nicotine (NICODERM CQ - DOSED IN MG/24 HOURS) 21 mg/24hr patch Place 1 patch onto the skin daily.      . nitroGLYCERIN (NITROSTAT) 0.4 MG SL tablet Place 1 tablet (0.4 mg total) under the tongue every 5 (five) minutes x 3 doses as needed for chest pain.  25 tablet  2  . OVER THE COUNTER MEDICATION daily. Herbal Life - Dietary Supplement      . predniSONE (DELTASONE) 5 MG tablet Take 1 tablet by mouth at bedtime.      . traMADol (ULTRAM) 50 MG tablet Take 50 mg by mouth every 4 (four) hours as needed for pain.       No current facility-administered medications for this visit.    LABS/IMAGING: No results found for this or any previous visit (from the past 48 hour(s)). No results found.  VITALS: BP 130/74  Pulse 68  Ht 5\' 8"  (1.727 m)  Wt 227 lb (102.967 kg)  BMI 34.52 kg/m2  EXAM: General appearance: alert and no distress Neck: no carotid bruit and no JVD Lungs: clear to auscultation bilaterally Heart: regular rate and rhythm, S1, S2 normal, no murmur, click, rub or gallop Abdomen: soft, non-tender; bowel sounds normal; no masses,  no organomegaly Extremities: extremities normal, atraumatic, no cyanosis or edema Pulses: 2+ and symmetric Skin: Skin color, texture, turgor normal. No rashes or lesions Neurologic: Grossly normal Psych: Mood, affect normal  EKG: Normal sinus rhythm at 68  ASSESSMENT: 1. Recent NSTEMI without any clear culprit lesion on cath 2. Possible Takatsubo cardiomyopathy 3. Dyslipidemia - followed by pcp 4. Hypertension - at goal 5. Tobacco abuse - working on quitting  PLAN: 1.   Morgan Torres is doing fairly well with an improvement in her symptoms and energy level. Unfortunately she has not been able to exercise as much due to problems with her hip. Blood pressure is well-controlled. She reports her cholesterol is well-controlled and followed by her primary. She is working on tobacco  cessation. Overall I think she is doing fairly well and I would recommend followup annually or sooner is necessary.  Pixie Casino, MD, [facility] Attending Cardiologist [facility]  HILTY,Kenneth C 03/22/2014, 8:50 AM

## 2014-04-12 ENCOUNTER — Other Ambulatory Visit: Payer: Self-pay | Admitting: Cardiology

## 2014-04-13 NOTE — Telephone Encounter (Signed)
Rx was sent to pharmacy electronically. 

## 2014-04-16 ENCOUNTER — Telehealth: Payer: Self-pay | Admitting: Internal Medicine

## 2014-04-16 NOTE — Telephone Encounter (Signed)
Ms. Croswell is calling because she wants to know if she should come in for cardiac clearance for surgery . The surgery has not been schedule as of yet they are waiting to hear from Dr.Hilty. Please call    Thanks

## 2014-04-16 NOTE — Telephone Encounter (Signed)
Returned call to patient she stated she needed cardiac clearance for a hip replacement to be done by Dr.Kraus at [facility].Message sent to Dr.Hilty.

## 2014-04-20 NOTE — Telephone Encounter (Signed)
Clearance is in Dr. Lysbeth Penner mail basket to sign off on

## 2014-04-22 NOTE — Telephone Encounter (Signed)
Pt. Is looking for clearance for surgeryh

## 2014-04-22 NOTE — Telephone Encounter (Signed)
Pt sad she was told last week if you had not heard from you by Wednesday.,to call back.

## 2014-04-22 NOTE — Telephone Encounter (Signed)
Pt called again and said she would like to hear something today.

## 2014-04-22 NOTE — Telephone Encounter (Signed)
Returned a call to patient and informed her per [place] that Dr. Debara Pickett has her cleaarance. Today is his first day back in the office and he is still seeing patients. Once he has reviewed the clearance they will let her know whether she needs appointment. Patient voiced understanding.

## 2014-04-23 ENCOUNTER — Telehealth: Payer: Self-pay | Admitting: Internal Medicine

## 2014-04-23 DIAGNOSIS — Z01818 Encounter for other preprocedural examination: Secondary | ICD-10-CM

## 2014-04-23 DIAGNOSIS — I429 Cardiomyopathy, unspecified: Secondary | ICD-10-CM

## 2014-04-23 NOTE — Telephone Encounter (Signed)
Echo ordered. Patient notified.

## 2014-04-23 NOTE — Telephone Encounter (Signed)
Surgery will not be scheduled until she has a cardiac clearance.  Wants to know if Dr. Debara Pickett wants to see her. Advised patient we will let her know as soon as Dr. Debara Pickett fills out the paperwork. Please call patient and advise her of Dr. Lysbeth Penner reponse to the clearance.

## 2014-04-23 NOTE — Telephone Encounter (Signed)
Spoke with patient >> per Dr. Debara Pickett an echocardiogram is necessary in order to clear for left THA by [place] (Dr. Flossie Dibble)  Echo ordered and patient notified. Message sent to scheduler.

## 2014-04-23 NOTE — Telephone Encounter (Signed)
Pt called again today,says she needs to hear something today.

## 2014-04-29 ENCOUNTER — Other Ambulatory Visit (HOSPITAL_COMMUNITY): Payer: Self-pay | Admitting: Internal Medicine

## 2014-04-29 ENCOUNTER — Ambulatory Visit (HOSPITAL_COMMUNITY)
Admission: RE | Admit: 2014-04-29 | Discharge: 2014-04-29 | Disposition: A | Discharge status: Home / Self Care | Payer: Medicare HMO | Source: Ambulatory Visit | Attending: Cardiology | Admitting: Cardiology

## 2014-04-29 DIAGNOSIS — Z01810 Encounter for preprocedural cardiovascular examination: Secondary | ICD-10-CM | POA: Diagnosis not present

## 2014-04-29 DIAGNOSIS — Z01818 Encounter for other preprocedural examination: Secondary | ICD-10-CM

## 2014-04-29 DIAGNOSIS — I429 Cardiomyopathy, unspecified: Secondary | ICD-10-CM

## 2014-04-29 DIAGNOSIS — J449 Chronic obstructive pulmonary disease, unspecified: Secondary | ICD-10-CM | POA: Diagnosis not present

## 2014-04-29 DIAGNOSIS — I255 Ischemic cardiomyopathy: Secondary | ICD-10-CM

## 2014-04-29 DIAGNOSIS — Z720 Tobacco use: Secondary | ICD-10-CM | POA: Diagnosis not present

## 2014-04-29 DIAGNOSIS — I2510 Atherosclerotic heart disease of native coronary artery without angina pectoris: Secondary | ICD-10-CM | POA: Insufficient documentation

## 2014-04-29 DIAGNOSIS — I10 Essential (primary) hypertension: Secondary | ICD-10-CM | POA: Insufficient documentation

## 2014-04-29 NOTE — Progress Notes (Signed)
2D Echocardiogram Complete.  04/29/2014   Maston Wight Odem, RDCS

## 2014-04-30 ENCOUNTER — Encounter: Payer: Self-pay | Admitting: *Deleted

## 2014-04-30 ENCOUNTER — Telehealth: Payer: Self-pay | Admitting: Internal Medicine

## 2014-04-30 NOTE — Telephone Encounter (Signed)
Patient notified of echo results and that Dr. Debara Pickett cleared her for surgery (low risk). Asked Lurena Joiner, [place] how long to hold aspirin (5 days). Letter composed and faxed to Raliegh Ip (AttnClaiborne Billings) @ [phone]

## 2014-04-30 NOTE — Telephone Encounter (Signed)
Patient would like test results from yesterday.

## 2014-04-30 NOTE — Telephone Encounter (Signed)
Patient called back and would like her ultrasound results from yesterday so she will not have to worry about that over the weekend.

## 2014-05-04 ENCOUNTER — Other Ambulatory Visit: Payer: Self-pay | Admitting: Cardiology

## 2014-05-04 NOTE — Telephone Encounter (Signed)
Rx was sent to pharmacy electronically. 

## 2014-05-10 IMAGING — CR DG CHEST 2V
2 series · 2 of 2 positions shown · non-contrast
Comparison: 03/12/2013; 05/11/2012; 05/05/2010

CLINICAL DATA: Cold, CP, history of COPD, bronchitis, smoking,
initial encounter.

CHEST - 2 VIEW

[w chest pa *]
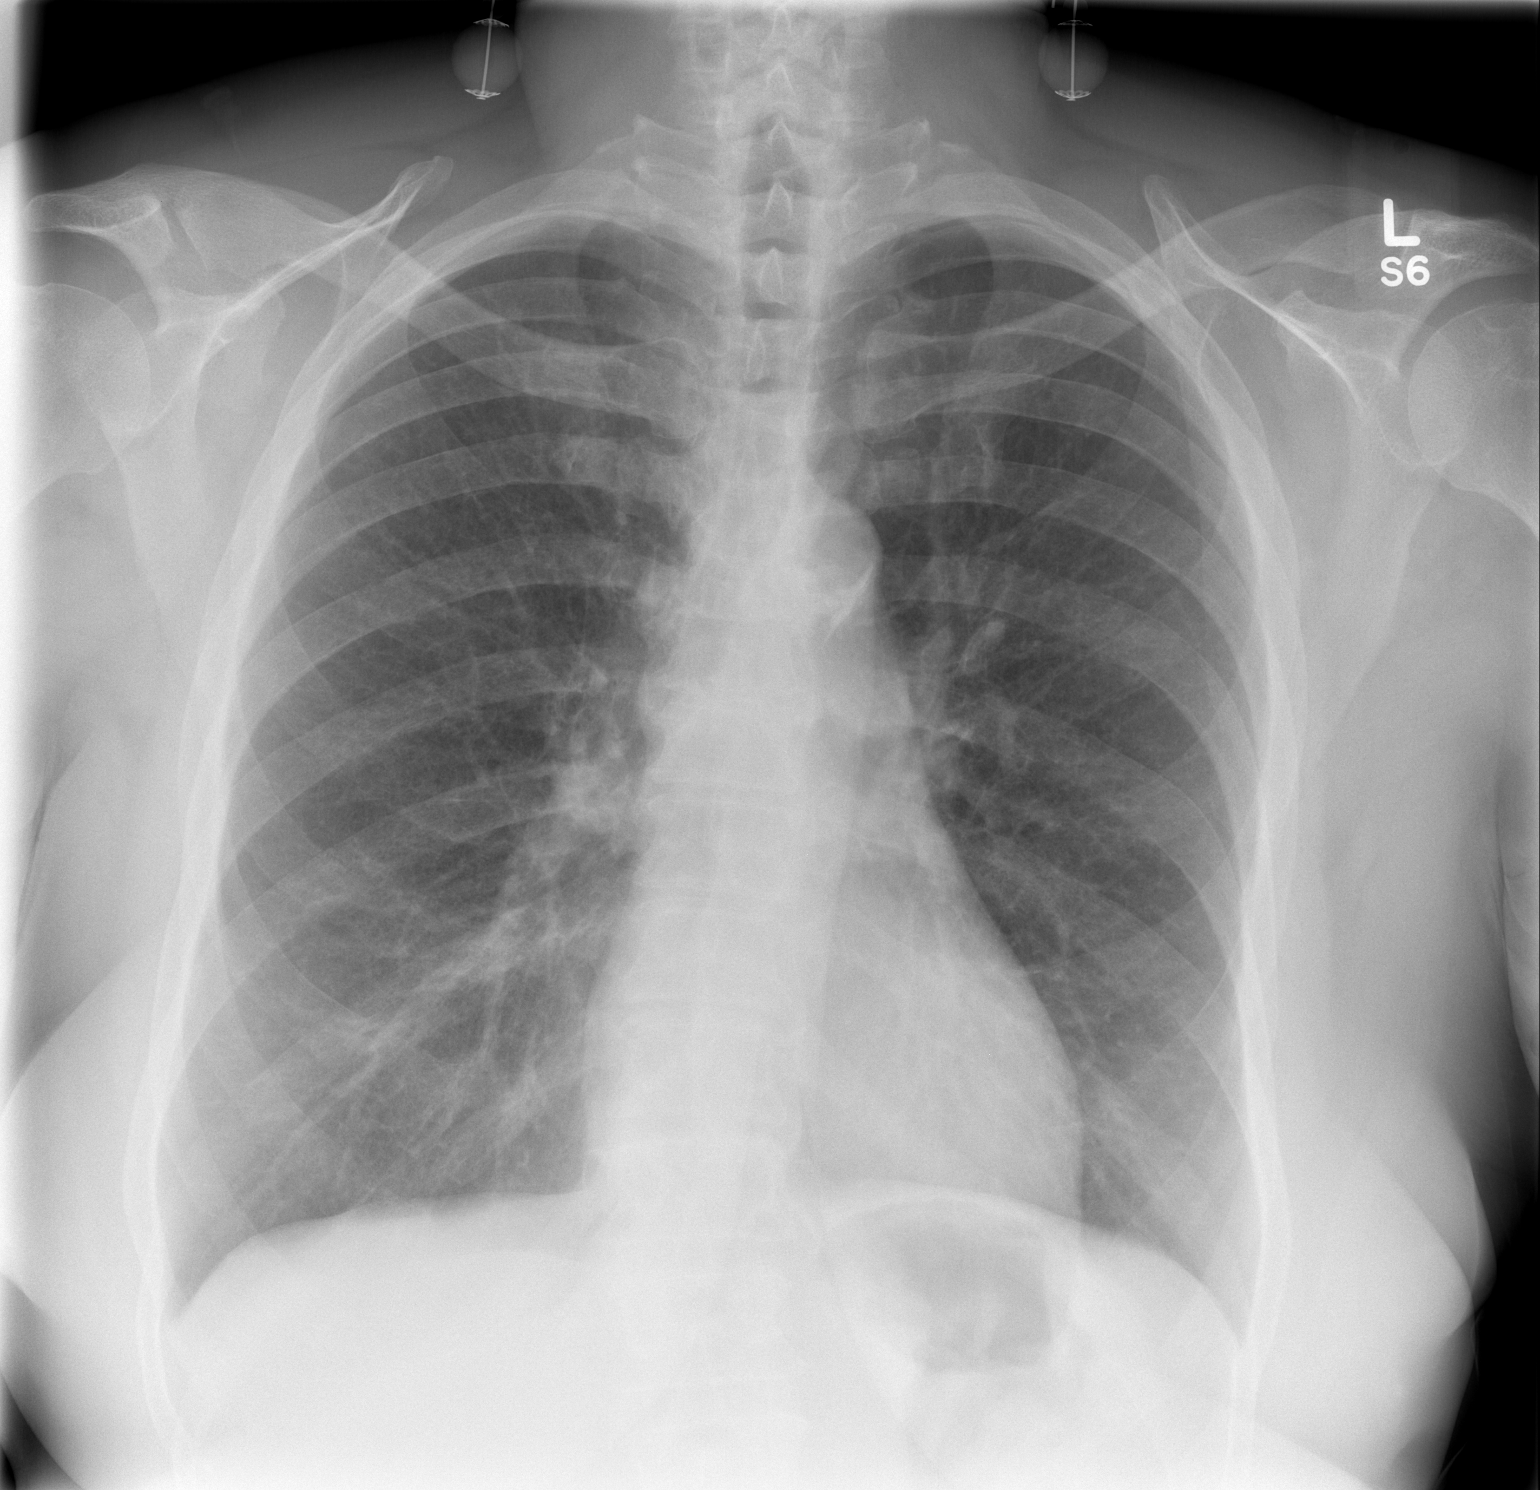

[w chest lat]
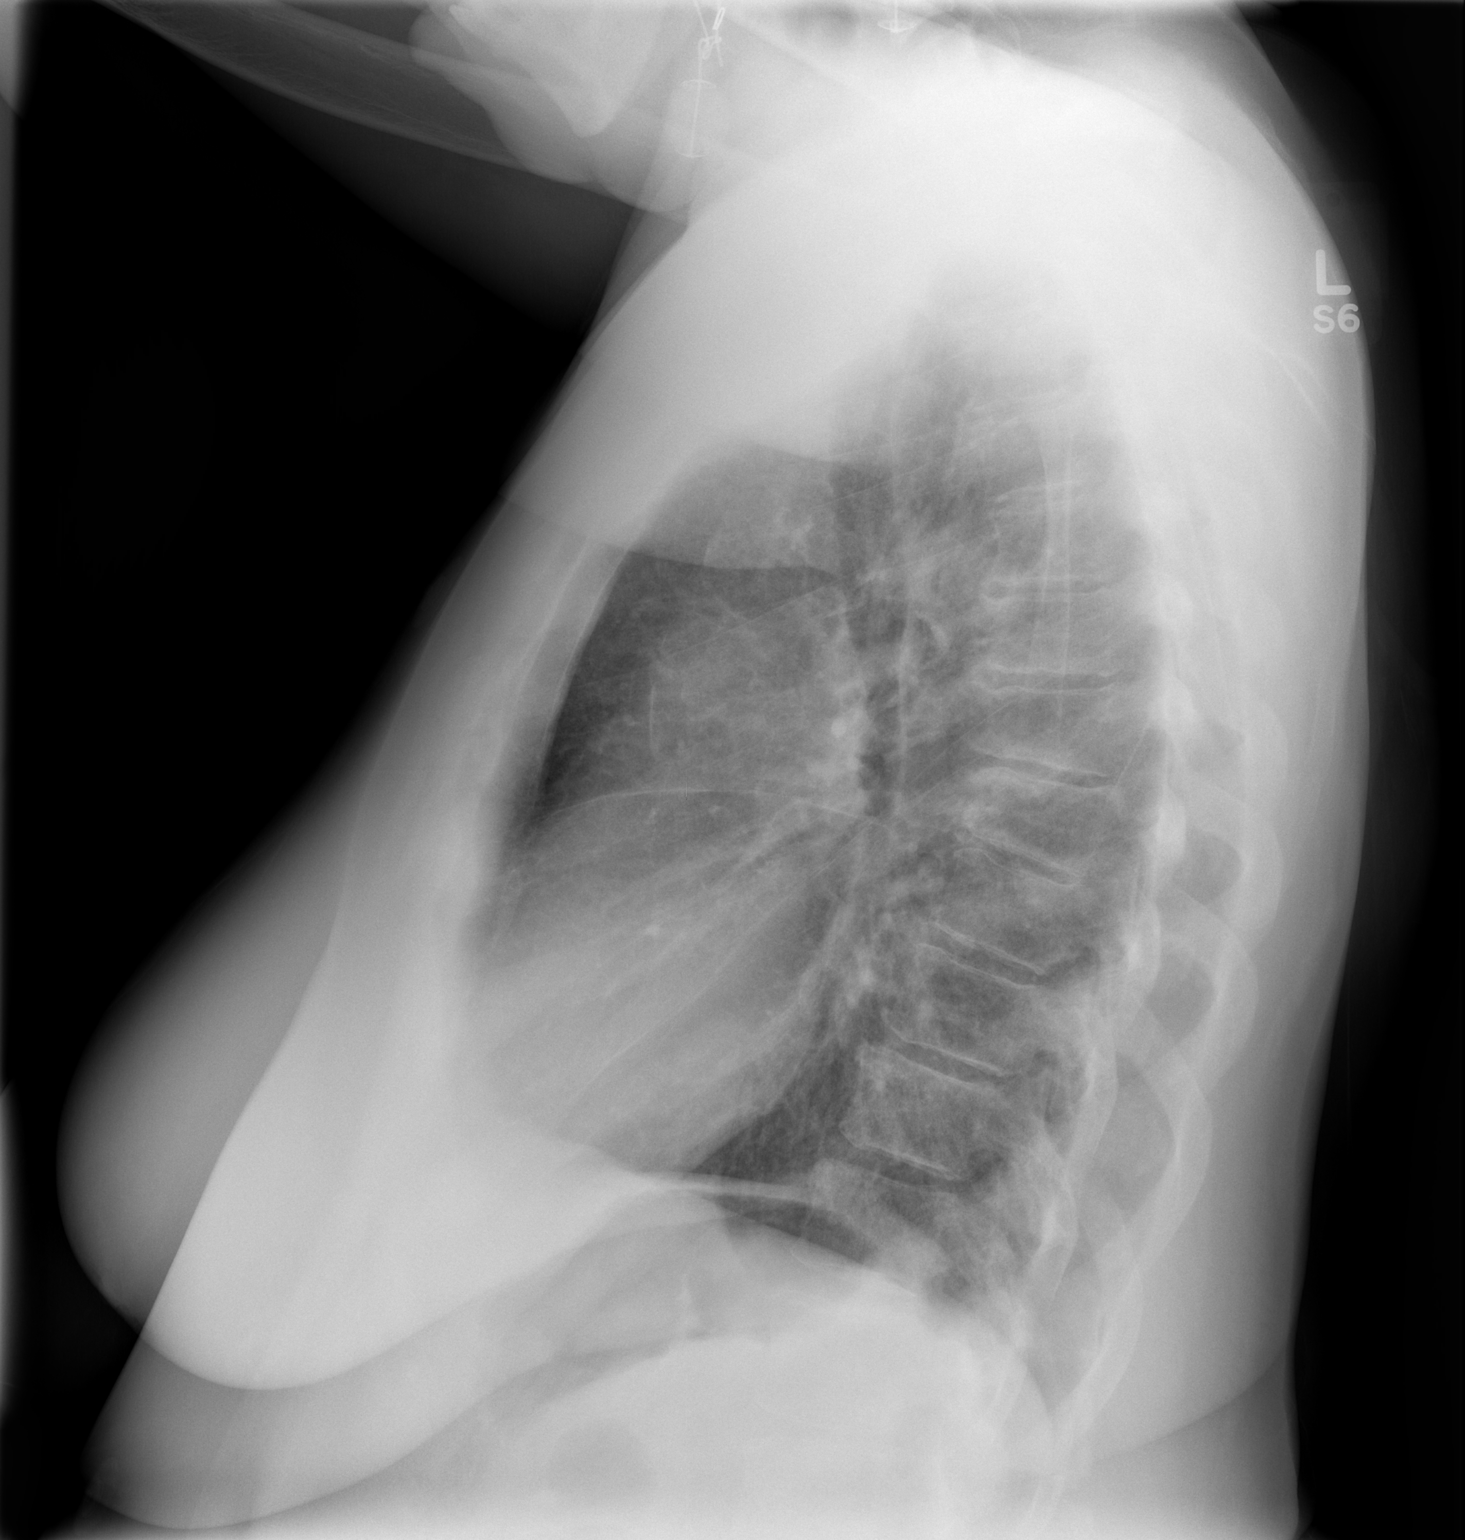

[2 of 2 positions shown; findings below may reference images not displayed]

FINDINGS: Grossly unchanged cardiac silhouette and mediastinal contours with
atherosclerotic calcifications within the thoracic aorta.  The
lungs appear hyperexpanded with flattening of the bilateral
hemidiaphragms and mild diffuse slightly nodular thickening of the
pulmonary interstitium.  There is minimal pleural parenchymal
thickening within the right minor fissure.  No focal airspace
opacities.  No pleural effusion or pneumothorax.  No evidence of
edema.  Grossly unchanged bones including mild (< 25%) compression
deformity of a mid thoracic vertebral body and associated mild
degenerative change within the mid thoracic spine.
IMPRESSION: Mild lung hyperexpansion and bronchitic change without acute
cardiopulmonary disease.

## 2014-05-12 ENCOUNTER — Ambulatory Visit: Payer: Self-pay | Admitting: Physician Assistant

## 2014-05-12 NOTE — H&P (Signed)
TOTAL HIP ADMISSION H&P  Patient is admitted for left total hip arthroplasty.  Subjective:  Chief Complaint: left hip pain  HPI: Morgan Torres, 63 y.o. female, has a history of pain and functional disability in the left hip(s) due to arthritis and patient has failed non-surgical conservative treatments for greater than 12 weeks to include NSAID's and/or analgesics, corticosteriod injections, use of assistive devices and activity modification.  Onset of symptoms was gradual starting 1 years ago with gradually worsening course since that time.The patient noted no past surgery on the left hip(s).  Patient currently rates pain in the left hip at 10 out of 10 with activity. Patient has night pain, worsening of pain with activity and weight bearing, pain that interfers with activities of daily living and pain with passive range of motion. Patient has evidence of subchondral cysts, periarticular osteophytes and joint space narrowing by imaging studies. This condition presents safety issues increasing the risk of falls. There is no current active infection.  Patient Active Problem List   Diagnosis Date Noted  . Obesity (BMI 30.0-34.9) 04/20/2013  . Takotsubo physiology at cath 03/30/13- EF 45% at cath, 55% by echo 04/01/2013  . Smoking 04/01/2013  . NSTEMI - Troponin pk 1.5 with ant TWI on EKG 03/29/2013  . Spinal stenosis, lumbar region, with neurogenic claudication 01/23/2013  . VAGINAL TRICHOMONIASIS 06/28/2006  . HYPERLIPIDEMIA 06/28/2006  . ANEMIA-NOS 06/28/2006  . ANXIETY 06/28/2006  . DEPRESSION 06/28/2006  . SYNDROME, CARPAL TUNNEL 06/28/2006  . HYPERTENSION 06/28/2006  . ALLERGIC RHINITIS 06/28/2006  . ASTHMA 06/28/2006  . PAROTIDITIS 06/28/2006  . LOW BACK PAIN 06/28/2006  . TB SKIN TEST, POSITIVE, HX OF 06/28/2006   Past Medical History  Diagnosis Date  . Lumbar herniated disc   . Sciatica   . Hypertension   . Small bowel obstruction   . COPD (chronic obstructive pulmonary  disease)   . Arthritis   . Depression   . Anxiety   . Bronchitis     Hx: of  . Pneumonia     Hx: of  . Fibromyalgia     Past Surgical History  Procedure Laterality Date  . Abdominal surgery      resection of small intestine  . Back surgery    . Abdominal hysterectomy    . Tonsillectomy    . Dilation and curettage of uterus    . Colonoscopy      Hx; of  . Hernia repair       (Not in a hospital admission) Allergies  Allergen Reactions  . Diclofenac Anaphylaxis  . Codeine Itching  . Hydrocodone Itching    History  Substance Use Topics  . Smoking status: Current Every Day Smoker -- 0.40 packs/day for 25 years    Types: Cigarettes    Last Attempt to Quit: 03/29/2013  . Smokeless tobacco: Never Used     Comment: Currently on the Nicoderm patch."smokes a few"  . Alcohol Use: Yes     Comment: 1 glass of wine occas. (1 every 2 weeks or so)    Family History  Problem Relation Age of Onset  . Hypertension Mother   . Diabetes Mother   . Cancer - Other Mother   . Cancer Sister      Review of Systems  Constitutional: Negative.   HENT: Negative.   Eyes: Negative.   Respiratory: Negative.   Cardiovascular: Negative.   Gastrointestinal: Negative.   Genitourinary: Negative.   Musculoskeletal: Positive for joint pain. Negative for falls.  Skin: Negative.   Neurological: Negative.   Endo/Heme/Allergies: Bruises/bleeds easily.  Psychiatric/Behavioral: Negative.     Objective:  Physical Exam  Constitutional: She is oriented to person, place, and time. She appears well-developed and well-nourished. No distress.  HENT:  Head: Normocephalic and atraumatic.  Nose: Nose normal.  Eyes: Conjunctivae and EOM are normal. Pupils are equal, round, and reactive to light.  Neck: Normal range of motion. Neck supple.  Cardiovascular: Normal rate, regular rhythm, normal heart sounds and intact distal pulses.   No murmur heard. Respiratory: Effort normal and breath sounds normal.  No respiratory distress. She has no wheezes. She has no rales. She exhibits no tenderness.  GI: Soft. Bowel sounds are normal. She exhibits no distension. There is no tenderness.  Musculoskeletal:       Left hip: She exhibits decreased range of motion, decreased strength and tenderness.  Lymphadenopathy:    She has no cervical adenopathy.  Neurological: She is alert and oriented to person, place, and time. No cranial nerve deficit.  Skin: Skin is warm and dry. No rash noted. No erythema.  Psychiatric: She has a normal mood and affect. Her behavior is normal.    Vital signs in last 24 hours: @VSRANGES @  Labs:   Estimated body mass index is 34.52 kg/(m^2) as calculated from the following:   Height as of 03/22/14: 5\' 8"  (1.727 m).   Weight as of 03/22/14: 102.967 kg (227 lb).   Imaging Review Plain radiographs demonstrate severe degenerative joint disease of the left hip(s). The bone quality appears to be good for age and reported activity level.  Assessment/Plan:  End stage arthritis, left hip(s)  The patient history, physical examination, clinical judgement of the provider and imaging studies are consistent with end stage degenerative joint disease of the left hip(s) and total hip arthroplasty is deemed medically necessary. The treatment options including medical management, injection therapy, arthroscopy and arthroplasty were discussed at length. The risks and benefits of total hip arthroplasty were presented and reviewed. The risks due to aseptic loosening, infection, stiffness, dislocation/subluxation,  thromboembolic complications and other imponderables were discussed.  The patient acknowledged the explanation, agreed to proceed with the plan and consent was signed. Patient is being admitted for inpatient treatment for surgery, pain control, PT, OT, prophylactic antibiotics, VTE prophylaxis, progressive ambulation and ADL's and discharge planning.The patient is planning to be  discharged home with home health services

## 2014-05-18 ENCOUNTER — Encounter (HOSPITAL_COMMUNITY): Payer: Self-pay

## 2014-05-18 ENCOUNTER — Encounter (HOSPITAL_COMMUNITY)
Admission: RE | Admit: 2014-05-18 | Discharge: 2014-05-18 | Disposition: A | Discharge status: Home / Self Care | Payer: Medicare HMO | Source: Ambulatory Visit | Attending: Orthopedic Surgery | Admitting: Orthopedic Surgery

## 2014-05-18 ENCOUNTER — Other Ambulatory Visit (HOSPITAL_COMMUNITY): Payer: Self-pay | Admitting: Internal Medicine

## 2014-05-18 ENCOUNTER — Encounter (HOSPITAL_COMMUNITY)
Admission: RE | Admit: 2014-05-18 | Discharge: 2014-05-18 | Disposition: A | Discharge status: Home / Self Care | Payer: Medicare HMO | Source: Ambulatory Visit | Attending: Physician Assistant | Admitting: Physician Assistant

## 2014-05-18 DIAGNOSIS — Z01812 Encounter for preprocedural laboratory examination: Secondary | ICD-10-CM | POA: Diagnosis present

## 2014-05-18 DIAGNOSIS — M1612 Unilateral primary osteoarthritis, left hip: Secondary | ICD-10-CM

## 2014-05-18 HISTORY — DX: Unspecified asthma, uncomplicated: J45.909

## 2014-05-18 HISTORY — DX: Acute myocardial infarction, unspecified: I21.9

## 2014-05-18 LAB — COMPREHENSIVE METABOLIC PANEL
ALT: 15 U/L (ref 0–35)
ANION GAP: 13 (ref 5–15)
AST: 16 U/L (ref 0–37)
Albumin: 3.8 g/dL (ref 3.5–5.2)
Alkaline Phosphatase: 103 U/L (ref 39–117)
BUN: 19 mg/dL (ref 6–23)
CALCIUM: 9.3 mg/dL (ref 8.4–10.5)
CO2: 26 mEq/L (ref 19–32)
Chloride: 102 mEq/L (ref 96–112)
Creatinine, Ser: 0.84 mg/dL (ref 0.50–1.10)
GFR calc Af Amer: 83 mL/min — ABNORMAL LOW (ref >90)
GFR calc non Af Amer: 72 mL/min — ABNORMAL LOW (ref >90)
Glucose, Bld: 91 mg/dL (ref 70–99)
Potassium: 4.1 mEq/L (ref 3.7–5.3)
SODIUM: 141 meq/L (ref 137–147)
TOTAL PROTEIN: 7.1 g/dL (ref 6.0–8.3)
Total Bilirubin: 0.2 mg/dL — ABNORMAL LOW (ref 0.3–1.2)

## 2014-05-18 LAB — CBC WITH DIFFERENTIAL/PLATELET
Basophils Absolute: 0 10*3/uL (ref 0.0–0.1)
Basophils Relative: 0 % (ref 0–1)
EOS ABS: 0.2 10*3/uL (ref 0.0–0.7)
EOS PCT: 3 % (ref 0–5)
HCT: 42.5 % (ref 36.0–46.0)
Hemoglobin: 14.1 g/dL (ref 12.0–15.0)
LYMPHS ABS: 3.2 10*3/uL (ref 0.7–4.0)
Lymphocytes Relative: 40 % (ref 12–46)
MCH: 31.8 pg (ref 26.0–34.0)
MCHC: 33.2 g/dL (ref 30.0–36.0)
MCV: 95.9 fL (ref 78.0–100.0)
Monocytes Absolute: 0.6 10*3/uL (ref 0.1–1.0)
Monocytes Relative: 7 % (ref 3–12)
Neutro Abs: 4 10*3/uL (ref 1.7–7.7)
Neutrophils Relative %: 50 % (ref 43–77)
PLATELETS: 247 10*3/uL (ref 150–400)
RBC: 4.43 MIL/uL (ref 3.87–5.11)
RDW: 14.1 % (ref 11.5–15.5)
WBC: 8.1 10*3/uL (ref 4.0–10.5)

## 2014-05-18 LAB — URINALYSIS, ROUTINE W REFLEX MICROSCOPIC
BILIRUBIN URINE: NEGATIVE
Glucose, UA: NEGATIVE mg/dL
Hgb urine dipstick: NEGATIVE
KETONES UR: NEGATIVE mg/dL
Leukocytes, UA: NEGATIVE
Nitrite: NEGATIVE
PROTEIN: NEGATIVE mg/dL
Specific Gravity, Urine: 1.015 (ref 1.005–1.030)
Urobilinogen, UA: 0.2 mg/dL (ref 0.0–1.0)
pH: 5.5 (ref 5.0–8.0)

## 2014-05-18 LAB — SURGICAL PCR SCREEN
MRSA, PCR: NEGATIVE
STAPHYLOCOCCUS AUREUS: NEGATIVE

## 2014-05-18 LAB — TYPE AND SCREEN
ABO/RH(D): B NEG
Antibody Screen: NEGATIVE

## 2014-05-18 LAB — APTT: aPTT: 30 seconds (ref 24–37)

## 2014-05-18 LAB — PROTIME-INR
INR: 1.06 (ref 0.00–1.49)
Prothrombin Time: 13.9 seconds (ref 11.6–15.2)

## 2014-05-18 NOTE — Pre-Procedure Instructions (Signed)
Morgan Torres  05/18/2014   Your procedure is scheduled on:  05/28/2013  Report to [facility] Admitting at 5:30 AM.  Call this number if you have problems the morning of surgery: [phone]   Remember:   Do not eat food or drink liquids after midnight.  On Thursday evening   Take these medicines the morning of surgery with A SIP OF WATER: Tylenol, amlodipine, lorazepam, metoprolol, oxycodone   Do not wear jewelry, make-up or nail polish.   Do not wear lotions, powders, or perfumes. You may wear deodorant.   Do not shave 48 hours prior to surgery.    Do not bring valuables to the hospital.  [facility] is not responsible    for any belongings or valuables.               Contacts, dentures or bridgework may not be worn into surgery.   Leave suitcase in the car. After surgery it may be brought to your room.   For patients admitted to the hospital, discharge time is determined by your                treatment team.               Patients discharged the day of surgery will not be allowed to drive  home.  Name and phone number of your driver: with family  Special Instructions: Special Instructions: [place] - Preparing for Surgery  Before surgery, you can play an important role.  Because skin is not sterile, your skin needs to be as free of germs as possible.  You can reduce the number of germs on you skin by washing with CHG (chlorahexidine gluconate) soap before surgery.  CHG is an antiseptic cleaner which kills germs and bonds with the skin to continue killing germs even after washing.  Please DO NOT use if you have an allergy to CHG or antibacterial soaps.  If your skin becomes reddened/irritated stop using the CHG and inform your nurse when you arrive at Short Stay.  Do not shave (including legs and underarms) for at least 48 hours prior to the first CHG shower.  You may shave your face.  Please follow these instructions carefully:   1.  Shower with CHG Soap the  night before surgery and the  morning of Surgery.  2.  If you choose to wash your hair, wash your hair first as usual with your  normal shampoo.  3.  After you shampoo, rinse your hair and body thoroughly to remove the  Shampoo.  4.  Use CHG as you would any other liquid soap.  You can apply chg directly to the skin and wash gently with scrungie or a clean washcloth.  5.  Apply the CHG Soap to your body ONLY FROM THE NECK DOWN.    Do not use on open wounds or open sores.  Avoid contact with your eyes, ears, mouth and genitals (private parts).  Wash genitals (private parts)   with your normal soap.  6.  Wash thoroughly, paying special attention to the area where your surgery will be performed.  7.  Thoroughly rinse your body with warm water from the neck down.  8.  DO NOT shower/wash with your normal soap after using and rinsing off   the CHG Soap.  9.  Pat yourself dry with a clean towel.            10.  Wear clean pajamas.  11.  Place clean sheets on your bed the night of your first shower and do not sleep with pets.  Day of Surgery  Do not apply any lotions/deodorants the morning of surgery.  Please wear clean clothes to the hospital/surgery center.   Please read over the following fact sheets that you were given: Pain Booklet, Coughing and Deep Breathing, Blood Transfusion Information, Total Joint Packet, MRSA Information and Surgical Site Infection Prevention

## 2014-05-19 NOTE — Telephone Encounter (Signed)
Rx refill sent to patient pharmacy   

## 2014-05-20 LAB — URINE CULTURE
Colony Count: NO GROWTH
Culture: NO GROWTH

## 2014-05-27 MED ORDER — CEFAZOLIN SODIUM-DEXTROSE 2-3 GM-% IV SOLR
2.0000 g | INTRAVENOUS | Status: AC
  Administered 2014-05-28: 2 g via INTRAVENOUS
  Filled 2014-05-27: qty 50

## 2014-05-28 ENCOUNTER — Inpatient Hospital Stay (HOSPITAL_COMMUNITY): Payer: Medicare HMO

## 2014-05-28 ENCOUNTER — Encounter (HOSPITAL_COMMUNITY)
Admission: RE | Disposition: A | Discharge status: Home / Self Care | Payer: Self-pay | Source: Ambulatory Visit | Attending: Orthopedic Surgery

## 2014-05-28 ENCOUNTER — Inpatient Hospital Stay (HOSPITAL_COMMUNITY): Payer: Medicare HMO | Admitting: Anesthesiology

## 2014-05-28 ENCOUNTER — Inpatient Hospital Stay (HOSPITAL_COMMUNITY)
Admission: RE | Admit: 2014-05-28 | Discharge: 2014-06-02 | DRG: 470 | Disposition: A | Discharge status: Home / Self Care | Payer: Medicare HMO | Source: Ambulatory Visit | Attending: Orthopedic Surgery | Admitting: Orthopedic Surgery

## 2014-05-28 ENCOUNTER — Encounter (HOSPITAL_COMMUNITY): Payer: Self-pay | Admitting: Anesthesiology

## 2014-05-28 DIAGNOSIS — F17210 Nicotine dependence, cigarettes, uncomplicated: Secondary | ICD-10-CM | POA: Diagnosis present

## 2014-05-28 DIAGNOSIS — M1612 Unilateral primary osteoarthritis, left hip: Principal | ICD-10-CM | POA: Diagnosis present

## 2014-05-28 DIAGNOSIS — I2510 Atherosclerotic heart disease of native coronary artery without angina pectoris: Secondary | ICD-10-CM | POA: Diagnosis present

## 2014-05-28 DIAGNOSIS — J449 Chronic obstructive pulmonary disease, unspecified: Secondary | ICD-10-CM | POA: Diagnosis present

## 2014-05-28 DIAGNOSIS — I10 Essential (primary) hypertension: Secondary | ICD-10-CM | POA: Diagnosis not present

## 2014-05-28 DIAGNOSIS — I252 Old myocardial infarction: Secondary | ICD-10-CM

## 2014-05-28 DIAGNOSIS — Z90710 Acquired absence of both cervix and uterus: Secondary | ICD-10-CM

## 2014-05-28 DIAGNOSIS — J45909 Unspecified asthma, uncomplicated: Secondary | ICD-10-CM | POA: Diagnosis present

## 2014-05-28 DIAGNOSIS — Z888 Allergy status to other drugs, medicaments and biological substances status: Secondary | ICD-10-CM

## 2014-05-28 DIAGNOSIS — D62 Acute posthemorrhagic anemia: Secondary | ICD-10-CM | POA: Diagnosis not present

## 2014-05-28 DIAGNOSIS — F329 Major depressive disorder, single episode, unspecified: Secondary | ICD-10-CM | POA: Diagnosis present

## 2014-05-28 DIAGNOSIS — Z886 Allergy status to analgesic agent status: Secondary | ICD-10-CM

## 2014-05-28 DIAGNOSIS — F419 Anxiety disorder, unspecified: Secondary | ICD-10-CM | POA: Diagnosis present

## 2014-05-28 HISTORY — PX: TOTAL HIP ARTHROPLASTY: SHX124

## 2014-05-28 HISTORY — DX: Atherosclerotic heart disease of native coronary artery without angina pectoris: I25.10

## 2014-05-28 LAB — CBC
HEMATOCRIT: 36.7 % (ref 36.0–46.0)
Hemoglobin: 12.8 g/dL (ref 12.0–15.0)
MCH: 32.5 pg (ref 26.0–34.0)
MCHC: 34.9 g/dL (ref 30.0–36.0)
MCV: 93.1 fL (ref 78.0–100.0)
PLATELETS: 223 10*3/uL (ref 150–400)
RBC: 3.94 MIL/uL (ref 3.87–5.11)
RDW: 14 % (ref 11.5–15.5)
WBC: 15.5 10*3/uL — ABNORMAL HIGH (ref 4.0–10.5)

## 2014-05-28 LAB — CREATININE, SERUM
Creatinine, Ser: 0.73 mg/dL (ref 0.50–1.10)
GFR calc Af Amer: >90 mL/min (ref >90)
GFR calc non Af Amer: 88 mL/min — ABNORMAL LOW (ref >90)

## 2014-05-28 SURGERY — ARTHROPLASTY, HIP, TOTAL,POSTERIOR APPROACH
Anesthesia: General | Site: Hip | Laterality: Left

## 2014-05-28 MED ORDER — FENTANYL CITRATE 0.05 MG/ML IJ SOLN
INTRAMUSCULAR | Status: AC
  Filled 2014-05-28: qty 5

## 2014-05-28 MED ORDER — GLYCOPYRROLATE 0.2 MG/ML IJ SOLN
INTRAMUSCULAR | Status: DC | PRN
  Administered 2014-05-28: 0.6 mg via INTRAVENOUS

## 2014-05-28 MED ORDER — NEOSTIGMINE METHYLSULFATE 10 MG/10ML IV SOLN
INTRAVENOUS | Status: AC
  Filled 2014-05-28: qty 1

## 2014-05-28 MED ORDER — METOCLOPRAMIDE HCL 5 MG/ML IJ SOLN: 5.0000 mg | Freq: Three times a day (TID) | INTRAMUSCULAR | Status: DC | PRN

## 2014-05-28 MED ORDER — SODIUM CHLORIDE 0.9 % IV SOLN
INTRAVENOUS | Status: DC
Start: 2014-05-28 — End: 2014-05-28

## 2014-05-28 MED ORDER — METOPROLOL TARTRATE 12.5 MG HALF TABLET
12.5000 mg | ORAL_TABLET | Freq: Two times a day (BID) | ORAL | Status: DC
  Administered 2014-05-28 – 2014-06-02 (×4): 12.5 mg via ORAL
  Filled 2014-05-28 (×12): qty 1

## 2014-05-28 MED ORDER — OXYCODONE HCL 5 MG PO TABS
5.0000 mg | ORAL_TABLET | ORAL | Status: DC | PRN
  Administered 2014-05-28 – 2014-06-02 (×23): 10 mg via ORAL
  Filled 2014-05-28 (×18): qty 2

## 2014-05-28 MED ORDER — HYDROMORPHONE HCL 1 MG/ML IJ SOLN
0.2500 mg | INTRAMUSCULAR | Status: DC | PRN
  Administered 2014-05-28 (×4): 0.5 mg via INTRAVENOUS

## 2014-05-28 MED ORDER — ACETAMINOPHEN 650 MG RE SUPP: 650.0000 mg | Freq: Four times a day (QID) | RECTAL | Status: DC | PRN

## 2014-05-28 MED ORDER — ATORVASTATIN CALCIUM 40 MG PO TABS
40.0000 mg | ORAL_TABLET | Freq: Every day | ORAL | Status: DC
  Administered 2014-05-28 – 2014-05-31 (×4): 40 mg via ORAL
  Filled 2014-05-28 (×7): qty 1

## 2014-05-28 MED ORDER — PHENYLEPHRINE HCL 10 MG/ML IJ SOLN
INTRAMUSCULAR | Status: DC | PRN
  Administered 2014-05-28 (×2): 40 ug via INTRAVENOUS

## 2014-05-28 MED ORDER — DIPHENHYDRAMINE HCL 50 MG/ML IJ SOLN
INTRAMUSCULAR | Status: AC
  Filled 2014-05-28: qty 1

## 2014-05-28 MED ORDER — CHLORHEXIDINE GLUCONATE 4 % EX LIQD
60.0000 mL | Freq: Once | CUTANEOUS | Status: DC
  Filled 2014-05-28: qty 60

## 2014-05-28 MED ORDER — ONDANSETRON HCL 4 MG PO TABS: 4.0000 mg | ORAL_TABLET | Freq: Four times a day (QID) | ORAL | Status: DC | PRN

## 2014-05-28 MED ORDER — AMLODIPINE BESYLATE 5 MG PO TABS
5.0000 mg | ORAL_TABLET | Freq: Every day | ORAL | Status: DC
  Administered 2014-06-02: 5 mg via ORAL
  Filled 2014-05-28 (×6): qty 1

## 2014-05-28 MED ORDER — GUAIFENESIN 100 MG/5ML PO LIQD
100.0000 mg | Freq: Three times a day (TID) | ORAL | Status: DC | PRN
  Filled 2014-05-28: qty 5

## 2014-05-28 MED ORDER — MIDAZOLAM HCL 2 MG/2ML IJ SOLN
INTRAMUSCULAR | Status: AC
  Filled 2014-05-28: qty 2

## 2014-05-28 MED ORDER — SCOPOLAMINE 1 MG/3DAYS TD PT72
MEDICATED_PATCH | TRANSDERMAL | Status: AC
  Filled 2014-05-28: qty 1

## 2014-05-28 MED ORDER — EPINEPHRINE 0.3 MG/0.3ML IJ SOAJ
0.3000 mg | Freq: Once | INTRAMUSCULAR | Status: AC | PRN
  Filled 2014-05-28: qty 0.6

## 2014-05-28 MED ORDER — ONDANSETRON HCL 4 MG/2ML IJ SOLN
4.0000 mg | Freq: Four times a day (QID) | INTRAMUSCULAR | Status: DC | PRN
  Administered 2014-05-29: 4 mg via INTRAVENOUS
  Filled 2014-05-28: qty 2

## 2014-05-28 MED ORDER — GLYCOPYRROLATE 0.2 MG/ML IJ SOLN
INTRAMUSCULAR | Status: AC
  Filled 2014-05-28: qty 2

## 2014-05-28 MED ORDER — PROMETHAZINE HCL 25 MG/ML IJ SOLN: 6.2500 mg | INTRAMUSCULAR | Status: DC | PRN

## 2014-05-28 MED ORDER — ROCURONIUM BROMIDE 100 MG/10ML IV SOLN
INTRAVENOUS | Status: DC | PRN
  Administered 2014-05-28: 50 mg via INTRAVENOUS

## 2014-05-28 MED ORDER — GLYCOPYRROLATE 0.2 MG/ML IJ SOLN
INTRAMUSCULAR | Status: AC
  Filled 2014-05-28: qty 3

## 2014-05-28 MED ORDER — OXYCODONE HCL 5 MG/5ML PO SOLN: 5.0000 mg | Freq: Once | ORAL | Status: AC | PRN

## 2014-05-28 MED ORDER — DOCUSATE SODIUM 100 MG PO CAPS
100.0000 mg | ORAL_CAPSULE | Freq: Two times a day (BID) | ORAL | Status: DC
  Administered 2014-05-28 – 2014-06-02 (×9): 100 mg via ORAL
  Filled 2014-05-28 (×10): qty 1

## 2014-05-28 MED ORDER — PROPOFOL 10 MG/ML IV BOLUS
INTRAVENOUS | Status: DC | PRN
  Administered 2014-05-28: 130 mg via INTRAVENOUS

## 2014-05-28 MED ORDER — SODIUM CHLORIDE 0.9 % IR SOLN
Status: DC | PRN
  Administered 2014-05-28: 1000 mL

## 2014-05-28 MED ORDER — HYDROMORPHONE HCL 1 MG/ML IJ SOLN
1.0000 mg | INTRAMUSCULAR | Status: DC | PRN
  Administered 2014-05-31 (×2): 1 mg via INTRAVENOUS
  Filled 2014-05-28 (×2): qty 1

## 2014-05-28 MED ORDER — EPHEDRINE SULFATE 50 MG/ML IJ SOLN
INTRAMUSCULAR | Status: AC
  Filled 2014-05-28: qty 1

## 2014-05-28 MED ORDER — ENOXAPARIN SODIUM 30 MG/0.3ML ~~LOC~~ SOLN
30.0000 mg | Freq: Two times a day (BID) | SUBCUTANEOUS | Status: DC
  Administered 2014-05-29 – 2014-06-02 (×9): 30 mg via SUBCUTANEOUS
  Filled 2014-05-28 (×11): qty 0.3

## 2014-05-28 MED ORDER — BISACODYL 5 MG PO TBEC: 5.0000 mg | DELAYED_RELEASE_TABLET | Freq: Every day | ORAL | Status: DC | PRN

## 2014-05-28 MED ORDER — CEFAZOLIN SODIUM-DEXTROSE 2-3 GM-% IV SOLR
2.0000 g | Freq: Four times a day (QID) | INTRAVENOUS | Status: AC
  Administered 2014-05-28 (×2): 2 g via INTRAVENOUS
  Filled 2014-05-28 (×2): qty 50

## 2014-05-28 MED ORDER — MENTHOL 3 MG MT LOZG: 1.0000 | LOZENGE | OROMUCOSAL | Status: DC | PRN

## 2014-05-28 MED ORDER — DEXAMETHASONE SODIUM PHOSPHATE 4 MG/ML IJ SOLN
INTRAMUSCULAR | Status: AC
  Filled 2014-05-28: qty 1

## 2014-05-28 MED ORDER — SODIUM CHLORIDE 0.9 % IJ SOLN
INTRAMUSCULAR | Status: AC
  Filled 2014-05-28: qty 10

## 2014-05-28 MED ORDER — SENNOSIDES-DOCUSATE SODIUM 8.6-50 MG PO TABS: 1.0000 | ORAL_TABLET | Freq: Every evening | ORAL | Status: DC | PRN

## 2014-05-28 MED ORDER — MIDAZOLAM HCL 2 MG/2ML IJ SOLN: 0.5000 mg | Freq: Once | INTRAMUSCULAR | Status: DC | PRN

## 2014-05-28 MED ORDER — PHENYLEPHRINE 40 MCG/ML (10ML) SYRINGE FOR IV PUSH (FOR BLOOD PRESSURE SUPPORT)
PREFILLED_SYRINGE | INTRAVENOUS | Status: AC
  Filled 2014-05-28: qty 10

## 2014-05-28 MED ORDER — FENTANYL CITRATE 0.05 MG/ML IJ SOLN
INTRAMUSCULAR | Status: DC | PRN
  Administered 2014-05-28: 150 ug via INTRAVENOUS
  Administered 2014-05-28: 50 ug via INTRAVENOUS
  Administered 2014-05-28: 100 ug via INTRAVENOUS
  Administered 2014-05-28 (×3): 50 ug via INTRAVENOUS

## 2014-05-28 MED ORDER — EPHEDRINE SULFATE 50 MG/ML IJ SOLN
INTRAMUSCULAR | Status: DC | PRN
  Administered 2014-05-28: 10 mg via INTRAVENOUS
  Administered 2014-05-28 (×2): 5 mg via INTRAVENOUS

## 2014-05-28 MED ORDER — LOSARTAN POTASSIUM 50 MG PO TABS
100.0000 mg | ORAL_TABLET | Freq: Every day | ORAL | Status: DC
  Administered 2014-05-28 – 2014-06-02 (×2): 100 mg via ORAL
  Filled 2014-05-28 (×6): qty 2

## 2014-05-28 MED ORDER — DIPHENHYDRAMINE HCL 50 MG/ML IJ SOLN
10.0000 mg | Freq: Once | INTRAMUSCULAR | Status: AC
  Administered 2014-05-28: 10 mg via INTRAVENOUS

## 2014-05-28 MED ORDER — ONDANSETRON HCL 4 MG/2ML IJ SOLN
INTRAMUSCULAR | Status: DC | PRN
  Administered 2014-05-28: 4 mg via INTRAVENOUS

## 2014-05-28 MED ORDER — DEXAMETHASONE SODIUM PHOSPHATE 4 MG/ML IJ SOLN
INTRAMUSCULAR | Status: DC | PRN
  Administered 2014-05-28: 4 mg via INTRAVENOUS

## 2014-05-28 MED ORDER — HYDROCHLOROTHIAZIDE 12.5 MG PO CAPS
12.5000 mg | ORAL_CAPSULE | Freq: Every day | ORAL | Status: DC
  Administered 2014-05-28 – 2014-06-02 (×2): 12.5 mg via ORAL
  Filled 2014-05-28 (×6): qty 1

## 2014-05-28 MED ORDER — LORAZEPAM 1 MG PO TABS
1.0000 mg | ORAL_TABLET | Freq: Every day | ORAL | Status: DC | PRN
  Administered 2014-05-30 – 2014-06-01 (×2): 1 mg via ORAL
  Filled 2014-05-28 (×2): qty 1

## 2014-05-28 MED ORDER — PROPOFOL 10 MG/ML IV BOLUS
INTRAVENOUS | Status: AC
  Filled 2014-05-28: qty 20

## 2014-05-28 MED ORDER — GLYCOPYRROLATE 0.2 MG/ML IJ SOLN
INTRAMUSCULAR | Status: AC
  Filled 2014-05-28: qty 1

## 2014-05-28 MED ORDER — ROCURONIUM BROMIDE 50 MG/5ML IV SOLN
INTRAVENOUS | Status: AC
  Filled 2014-05-28: qty 1

## 2014-05-28 MED ORDER — SODIUM CHLORIDE 0.9 % IV SOLN
INTRAVENOUS | Status: DC
  Administered 2014-05-28 – 2014-05-29 (×2): via INTRAVENOUS

## 2014-05-28 MED ORDER — LOSARTAN POTASSIUM-HCTZ 100-12.5 MG PO TABS: 1.0000 | ORAL_TABLET | Freq: Every day | ORAL | Status: DC

## 2014-05-28 MED ORDER — PHENOL 1.4 % MT LIQD: 1.0000 | OROMUCOSAL | Status: DC | PRN

## 2014-05-28 MED ORDER — ONDANSETRON HCL 4 MG/2ML IJ SOLN
INTRAMUSCULAR | Status: AC
  Filled 2014-05-28: qty 2

## 2014-05-28 MED ORDER — OXYCODONE HCL 5 MG PO TABS
5.0000 mg | ORAL_TABLET | Freq: Once | ORAL | Status: AC | PRN
  Administered 2014-05-28: 5 mg via ORAL

## 2014-05-28 MED ORDER — DIPHENHYDRAMINE HCL 12.5 MG/5ML PO ELIX: 12.5000 mg | ORAL_SOLUTION | ORAL | Status: DC | PRN

## 2014-05-28 MED ORDER — MEPERIDINE HCL 25 MG/ML IJ SOLN: 6.2500 mg | INTRAMUSCULAR | Status: DC | PRN

## 2014-05-28 MED ORDER — FLEET ENEMA 7-19 GM/118ML RE ENEM: 1.0000 | ENEMA | Freq: Once | RECTAL | Status: AC | PRN

## 2014-05-28 MED ORDER — GABAPENTIN 100 MG PO CAPS
100.0000 mg | ORAL_CAPSULE | Freq: Every day | ORAL | Status: DC
  Administered 2014-05-28 – 2014-06-01 (×5): 100 mg via ORAL
  Filled 2014-05-28 (×6): qty 1

## 2014-05-28 MED ORDER — VECURONIUM BROMIDE 10 MG IV SOLR
INTRAVENOUS | Status: DC | PRN
  Administered 2014-05-28: 2 mg via INTRAVENOUS

## 2014-05-28 MED ORDER — METOCLOPRAMIDE HCL 10 MG PO TABS: 5.0000 mg | ORAL_TABLET | Freq: Three times a day (TID) | ORAL | Status: DC | PRN

## 2014-05-28 MED ORDER — LIDOCAINE HCL (CARDIAC) 20 MG/ML IV SOLN
INTRAVENOUS | Status: DC | PRN
  Administered 2014-05-28: 40 mg via INTRAVENOUS

## 2014-05-28 MED ORDER — NITROGLYCERIN 0.4 MG SL SUBL
0.4000 mg | SUBLINGUAL_TABLET | SUBLINGUAL | Status: DC | PRN
Start: 2014-05-28 — End: 2014-06-02

## 2014-05-28 MED ORDER — ALBUTEROL SULFATE (2.5 MG/3ML) 0.083% IN NEBU: 3.0000 mL | INHALATION_SOLUTION | Freq: Four times a day (QID) | RESPIRATORY_TRACT | Status: DC | PRN

## 2014-05-28 MED ORDER — LIDOCAINE HCL (CARDIAC) 20 MG/ML IV SOLN
INTRAVENOUS | Status: AC
  Filled 2014-05-28: qty 5

## 2014-05-28 MED ORDER — ACETAMINOPHEN 325 MG PO TABS
650.0000 mg | ORAL_TABLET | Freq: Four times a day (QID) | ORAL | Status: DC | PRN
  Administered 2014-05-28 – 2014-05-30 (×2): 650 mg via ORAL
  Filled 2014-05-28: qty 2

## 2014-05-28 MED ORDER — NEOSTIGMINE METHYLSULFATE 10 MG/10ML IV SOLN
INTRAVENOUS | Status: DC | PRN
Start: 2014-05-28 — End: 2014-05-28
  Administered 2014-05-28: 4 mg via INTRAVENOUS

## 2014-05-28 MED ORDER — MIDAZOLAM HCL 5 MG/5ML IJ SOLN
INTRAMUSCULAR | Status: DC | PRN
  Administered 2014-05-28: 2 mg via INTRAVENOUS

## 2014-05-28 MED ORDER — SUCCINYLCHOLINE CHLORIDE 20 MG/ML IJ SOLN
INTRAMUSCULAR | Status: AC
  Filled 2014-05-28: qty 1

## 2014-05-28 MED ORDER — LACTATED RINGERS IV SOLN
INTRAVENOUS | Status: DC | PRN
  Administered 2014-05-28 (×3): via INTRAVENOUS

## 2014-05-28 SURGICAL SUPPLY — 61 items
BLADE SAW SAG 73X25 THK (BLADE) ×1
BLADE SAW SGTL 73X25 THK (BLADE) ×1 IMPLANT
BRUSH FEMORAL CANAL (MISCELLANEOUS) IMPLANT
CAPT HIP TOTAL 2 ×2 IMPLANT
COVER BACK TABLE 24X17X13 BIG (DRAPES) IMPLANT
COVER SURGICAL LIGHT HANDLE (MISCELLANEOUS) ×2 IMPLANT
DRAPE IMP U-DRAPE 54X76 (DRAPES) ×2 IMPLANT
DRAPE INCISE IOBAN 66X45 STRL (DRAPES) ×4 IMPLANT
DRAPE ORTHO SPLIT 77X108 STRL (DRAPES) ×2
DRAPE SURG ORHT 6 SPLT 77X108 (DRAPES) ×2 IMPLANT
DRAPE U-SHAPE 47X51 STRL (DRAPES) ×2 IMPLANT
DRSG ADAPTIC 3X8 NADH LF (GAUZE/BANDAGES/DRESSINGS) ×2 IMPLANT
DRSG PAD ABDOMINAL 8X10 ST (GAUZE/BANDAGES/DRESSINGS) ×4 IMPLANT
DURAPREP 26ML APPLICATOR (WOUND CARE) ×2 IMPLANT
ELECT CAUTERY BLADE 6.4 (BLADE) ×2 IMPLANT
ELECT REM PT RETURN 9FT ADLT (ELECTROSURGICAL) ×2
ELECTRODE REM PT RTRN 9FT ADLT (ELECTROSURGICAL) ×1 IMPLANT
EVACUATOR 1/8 PVC DRAIN (DRAIN) IMPLANT
FACESHIELD WRAPAROUND (MASK) ×4 IMPLANT
GAUZE SPONGE 4X4 12PLY STRL (GAUZE/BANDAGES/DRESSINGS) ×2 IMPLANT
GLOVE BIOGEL PI IND STRL 8 (GLOVE) ×2 IMPLANT
GLOVE BIOGEL PI INDICATOR 8 (GLOVE) ×2
GLOVE ORTHO TXT STRL SZ7.5 (GLOVE) ×4 IMPLANT
GLOVE SURG ORTHO 8.0 STRL STRW (GLOVE) ×4 IMPLANT
GOWN STRL REUS W/ TWL LRG LVL3 (GOWN DISPOSABLE) ×1 IMPLANT
GOWN STRL REUS W/ TWL XL LVL3 (GOWN DISPOSABLE) ×2 IMPLANT
GOWN STRL REUS W/TWL 2XL LVL3 (GOWN DISPOSABLE) ×2 IMPLANT
GOWN STRL REUS W/TWL LRG LVL3 (GOWN DISPOSABLE) ×1
GOWN STRL REUS W/TWL XL LVL3 (GOWN DISPOSABLE) ×2
HANDPIECE INTERPULSE COAX TIP (DISPOSABLE)
HOOD PEEL AWAY FACE SHEILD DIS (HOOD) ×2 IMPLANT
IMMOBILIZER KNEE 20 (SOFTGOODS) IMPLANT
IMMOBILIZER KNEE 22 (SOFTGOODS) ×2 IMPLANT
IMMOBILIZER KNEE 22 UNIV (SOFTGOODS) IMPLANT
IMMOBILIZER KNEE 24 THIGH 36 (MISCELLANEOUS) IMPLANT
IMMOBILIZER KNEE 24 UNIV (MISCELLANEOUS)
KIT BASIN OR (CUSTOM PROCEDURE TRAY) ×2 IMPLANT
KIT ROOM TURNOVER OR (KITS) ×2 IMPLANT
MANIFOLD NEPTUNE II (INSTRUMENTS) ×2 IMPLANT
NEEDLE 22X1 1/2 (OR ONLY) (NEEDLE) ×2 IMPLANT
NEEDLE MAYO TROCAR (NEEDLE) ×2 IMPLANT
NS IRRIG 1000ML POUR BTL (IV SOLUTION) ×2 IMPLANT
PACK TOTAL JOINT (CUSTOM PROCEDURE TRAY) ×2 IMPLANT
PACK UNIVERSAL I (CUSTOM PROCEDURE TRAY) ×2 IMPLANT
PAD ARMBOARD 7.5X6 YLW CONV (MISCELLANEOUS) ×4 IMPLANT
PRESSURIZER FEMORAL UNIV (MISCELLANEOUS) IMPLANT
SET HNDPC FAN SPRY TIP SCT (DISPOSABLE) IMPLANT
SPONGE GAUZE 4X4 12PLY STER LF (GAUZE/BANDAGES/DRESSINGS) ×2 IMPLANT
STAPLER VISISTAT 35W (STAPLE) ×2 IMPLANT
SUCTION FRAZIER TIP 10 FR DISP (SUCTIONS) ×2 IMPLANT
SUT ETHIBOND NAB CT1 #1 30IN (SUTURE) ×8 IMPLANT
SUT VIC AB 1 CTB1 27 (SUTURE) ×4 IMPLANT
SUT VIC AB 2-0 CT1 27 (SUTURE) ×2
SUT VIC AB 2-0 CT1 TAPERPNT 27 (SUTURE) ×2 IMPLANT
SYR CONTROL 10ML LL (SYRINGE) ×2 IMPLANT
TAPE CLOTH SURG 6X10 WHT LF (GAUZE/BANDAGES/DRESSINGS) ×2 IMPLANT
TOWEL OR 17X24 6PK STRL BLUE (TOWEL DISPOSABLE) ×2 IMPLANT
TOWEL OR 17X26 10 PK STRL BLUE (TOWEL DISPOSABLE) ×2 IMPLANT
TOWER CARTRIDGE SMART MIX (DISPOSABLE) IMPLANT
TRAY FOLEY CATH 16FRSI W/METER (SET/KITS/TRAYS/PACK) ×2 IMPLANT
WATER STERILE IRR 1000ML POUR (IV SOLUTION) IMPLANT

## 2014-05-28 NOTE — Anesthesia Preprocedure Evaluation (Addendum)
Anesthesia Evaluation  Patient identified by MRN, date of birth, ID band Patient awake    Reviewed: Allergy & Precautions, H&P , NPO status , Patient's Chart, lab work & pertinent test results, reviewed documented beta blocker date and time   History of Anesthesia Complications Negative for: history of anesthetic complications  Airway Mallampati: II  TM Distance: >3 FB Neck ROM: Full    Dental  (+) Edentulous Upper, Poor Dentition, Loose, Missing, Dental Advisory Given   Pulmonary COPD COPD inhaler, Current Smoker,  breath sounds clear to auscultation        Cardiovascular hypertension, Pt. on medications and Pt. on home beta blockers + CAD and + Past MI Rhythm:Regular Rate:Normal  11/15 ECHO: EF 55-60%, valves OK   Neuro/Psych negative neurological ROS     GI/Hepatic negative GI ROS, Neg liver ROS,   Endo/Other  Morbid obesity  Renal/GU negative Renal ROS     Musculoskeletal   Abdominal (+) + obese,   Peds  Hematology negative hematology ROS (+)   Anesthesia Other Findings   Reproductive/Obstetrics                            Anesthesia Physical Anesthesia Plan  ASA: III  Anesthesia Plan: General   Post-op Pain Management:    Induction: Intravenous  Airway Management Planned: Oral ETT  Additional Equipment:   Intra-op Plan:   Post-operative Plan: Extubation in OR  Informed Consent: I have reviewed the patients History and Physical, chart, labs and discussed the procedure including the risks, benefits and alternatives for the proposed anesthesia with the patient or authorized representative who has indicated his/her understanding and acceptance.   Dental advisory given  Plan Discussed with: CRNA and Surgeon  Anesthesia Plan Comments: (Plan routine monitors, GETA)        Anesthesia Quick Evaluation

## 2014-05-28 NOTE — Evaluation (Signed)
Physical Therapy Evaluation Patient Details Name: ROTUNDA WORDEN MRN: [id number] DOB: 25-Oct-1950 Today's Date: 05/28/2014   History of Present Illness  Pt is a 63 y.o. female s/p Lt THA 05/28/14.  Clinical Impression  Pt is s/p LT THA POD#0 resulting in the deficits listed below (see PT Problem List). Pt will benefit from skilled PT to increase their independence and safety with mobility to allow discharge to the venue listed below. Pt very motivated to progress and D/C home this weekend.     Follow Up Recommendations Home health PT;Supervision/Assistance - 24 hour    Equipment Recommendations  None recommended by PT    Recommendations for Other Services OT consult     Precautions / Restrictions Precautions Precautions: Posterior Hip;Fall Precaution Booklet Issued: Yes (comment) Precaution Comments: given handout and reviewed precautions with family Required Braces or Orthoses: Knee Immobilizer - Left Knee Immobilizer - Left: Other (comment) (in bed for hip precautions) Restrictions Weight Bearing Restrictions: Yes LLE Weight Bearing: Weight bearing as tolerated      Mobility  Bed Mobility Overal bed mobility: Needs Assistance Bed Mobility: Supine to Sit     Supine to sit: Mod assist;HOB elevated     General bed mobility comments: (A) to advance Lt LE to/off EOB and handheld (A) needed to elevate trunk; pt initially lethargic but easily aroused with cues and conversation  Transfers Overall transfer level: Needs assistance Equipment used: Rolling walker (2 wheeled) Transfers: Sit to/from Stand Sit to Stand: Mod assist         General transfer comment: cues for hand placement and (A) to power up to stand; cues for hip precautions  Ambulation/Gait Ambulation/Gait assistance: Min assist Ambulation Distance (Feet): 6 Feet Assistive device: Rolling walker (2 wheeled) Gait Pattern/deviations: Step-to pattern;Decreased stance time - left;Decreased step length -  right;Trunk flexed;Wide base of support;Antalgic Gait velocity: decreased Gait velocity interpretation: Below normal speed for age/gender General Gait Details: cues for gt sequencing and safety with RW: min (A) to manage RW and maintain balance   Stairs            Wheelchair Mobility    Modified Rankin (Stroke Patients Only)       Balance Overall balance assessment: Needs assistance Sitting-balance support: Feet supported;No upper extremity supported Sitting balance-Leahy Scale: Fair Sitting balance - Comments: denied any dizziness   Standing balance support: During functional activity;Bilateral upper extremity supported Standing balance-Leahy Scale: Poor Standing balance comment: relying on RW for balance                             Pertinent Vitals/Pain Pain Assessment: 0-10 Pain Score: 8  Pain Location: Lt hip Pain Descriptors / Indicators: Aching Pain Intervention(s): Monitored during session;Premedicated before session;Repositioned    Home Living Family/patient expects to be discharged to:: Private residence Living Arrangements: Other relatives;Non-relatives/Friends Available Help at Discharge: Friend(s);Family;Available 24 hours/day Type of Home: House Home Access: Stairs to enter Entrance Stairs-Rails: Right Entrance Stairs-Number of Steps: 2 Home Layout: One level Home Equipment: Walker - 2 wheels;Bedside commode Additional Comments: all DME from previous surgeries     Prior Function Level of Independence: Independent with assistive device(s)         Comments: ambulated with cane as needed      Hand Dominance   Dominant Hand: Right    Extremity/Trunk Assessment   Upper Extremity Assessment: Defer to OT evaluation           Lower Extremity  Assessment: LLE deficits/detail      Cervical / Trunk Assessment: Normal  Communication   Communication: No difficulties  Cognition Arousal/Alertness: Lethargic;Suspect due to  medications Behavior During Therapy: Flat affect Overall Cognitive Status: Within Functional Limits for tasks assessed                      General Comments      Exercises Total Joint Exercises Ankle Circles/Pumps: AROM;Both;10 reps;Seated      Assessment/Plan    PT Assessment Patient needs continued PT services  PT Diagnosis Difficulty walking;Generalized weakness;Acute pain   PT Problem List Decreased strength;Decreased range of motion;Decreased activity tolerance;Decreased mobility;Decreased balance;Decreased knowledge of use of DME;Decreased knowledge of precautions;Pain  PT Treatment Interventions DME instruction;Gait training;Stair training;Functional mobility training;Therapeutic activities;Therapeutic exercise;Balance training;Neuromuscular re-education;Patient/family education   PT Goals (Current goals can be found in the Care Plan section) Acute Rehab PT Goals Patient Stated Goal: to go home  PT Goal Formulation: With patient Time For Goal Achievement: 06/04/14 Potential to Achieve Goals: Good    Frequency 7X/week   Barriers to discharge        Co-evaluation               End of Session Equipment Utilized During Treatment: Gait belt Activity Tolerance: Patient tolerated treatment well Patient left: in chair;with call bell/phone within reach;with family/visitor present Nurse Communication: Mobility status;Precautions;Weight bearing status         Time: 6579-0383 PT Time Calculation (min) (ACUTE ONLY): 23 min   Charges:   PT Evaluation $Initial PT Evaluation Tier I: 1 Procedure PT Treatments $Gait Training: 8-22 mins   PT G CodesGustavus Bryant, [place] 05/28/2014, 3:44 PM

## 2014-05-28 NOTE — Anesthesia Postprocedure Evaluation (Signed)
  Anesthesia Post-op Note  Patient: Morgan Torres  Procedure(s) Performed: Procedure(s): LEFT TOTAL HIP ARTHROPLASTY (Left)  Patient Location: PACU  Anesthesia Type:General  Level of Consciousness: awake, alert , oriented and patient cooperative  Airway and Oxygen Therapy: Patient Spontanous Breathing and Patient connected to nasal cannula oxygen  Post-op Pain: mild  Post-op Assessment: Post-op Vital signs reviewed, Patient's Cardiovascular Status Stable, Respiratory Function Stable, Patent Airway, No signs of Nausea or vomiting and Pain level controlled  Post-op Vital Signs: Reviewed and stable  Last Vitals:  Filed Vitals:   05/28/14 1240  BP: 167/87  Pulse: 77  Temp: 36.4 C  Resp: 16    Complications: No apparent anesthesia complications

## 2014-05-28 NOTE — Interval H&P Note (Signed)
History and Physical Interval Note:  05/28/2014 7:26 AM  Morgan Torres  has presented today for surgery, with the diagnosis of OA LEFT HIP  The various methods of treatment have been discussed with the patient and family. After consideration of risks, benefits and other options for treatment, the patient has consented to  Procedure(s): LEFT TOTAL HIP ARTHROPLASTY (Left) as a surgical intervention .  The patient's history has been reviewed, patient examined, no change in status, stable for surgery.  I have reviewed the patient's chart and labs.  Questions were answered to the patient's satisfaction.     [name],[name]

## 2014-05-28 NOTE — Brief Op Note (Signed)
05/28/2014  10:13 AM  PATIENT:  Morgan Torres  63 y.o. female  PRE-OPERATIVE DIAGNOSIS:  OA LEFT HIP  POST-OPERATIVE DIAGNOSIS:  OA LEFT HIP  PROCEDURE:  Procedure(s): LEFT TOTAL HIP ARTHROPLASTY (Left)  SURGEON:  Surgeon(s) and Role:    * [name] Valeta Harms., MD - Primary  PHYSICIAN ASSISTANT: Chriss Czar, PA-C  ASSISTANTS:    ANESTHESIA:   general  EBL:  Total I/O In: 2000 [I.V.:2000] Out: 450 [Urine:150; Blood:300]  BLOOD ADMINISTERED:none  DRAINS: none   LOCAL MEDICATIONS USED:  NONE  SPECIMEN:  No Specimen  DISPOSITION OF SPECIMEN:  N/A  COUNTS:  YES  TOURNIQUET:  * No tourniquets in log *  DICTATION: .Other Dictation: Dictation Number unknown  PLAN OF CARE: Admit to inpatient   PATIENT DISPOSITION:  PACU - hemodynamically stable.   Delay start of Pharmacological VTE agent (>24hrs) due to surgical blood loss or risk of bleeding: yes

## 2014-05-28 NOTE — Transfer of Care (Signed)
Immediate Anesthesia Transfer of Care Note  Patient: Morgan Torres  Procedure(s) Performed: Procedure(s): LEFT TOTAL HIP ARTHROPLASTY (Left)  Patient Location: PACU  Anesthesia Type:General  Level of Consciousness: awake, oriented and patient cooperative  Airway & Oxygen Therapy: Patient Spontanous Breathing and Patient connected to nasal cannula oxygen  Post-op Assessment: Report given to PACU RN and Post -op Vital signs reviewed and stable  Post vital signs: Reviewed  Complications: No apparent anesthesia complications

## 2014-05-28 NOTE — H&P (View-Only) (Signed)
TOTAL HIP ADMISSION H&P  Patient is admitted for left total hip arthroplasty.  Subjective:  Chief Complaint: left hip pain  HPI: Morgan Torres, 63 y.o. female, has a history of pain and functional disability in the left hip(s) due to arthritis and patient has failed non-surgical conservative treatments for greater than 12 weeks to include NSAID's and/or analgesics, corticosteriod injections, use of assistive devices and activity modification.  Onset of symptoms was gradual starting 1 years ago with gradually worsening course since that time.The patient noted no past surgery on the left hip(s).  Patient currently rates pain in the left hip at 10 out of 10 with activity. Patient has night pain, worsening of pain with activity and weight bearing, pain that interfers with activities of daily living and pain with passive range of motion. Patient has evidence of subchondral cysts, periarticular osteophytes and joint space narrowing by imaging studies. This condition presents safety issues increasing the risk of falls. There is no current active infection.  Patient Active Problem List   Diagnosis Date Noted  . Obesity (BMI 30.0-34.9) 04/20/2013  . Takotsubo physiology at cath 03/30/13- EF 45% at cath, 55% by echo 04/01/2013  . Smoking 04/01/2013  . NSTEMI - Troponin pk 1.5 with ant TWI on EKG 03/29/2013  . Spinal stenosis, lumbar region, with neurogenic claudication 01/23/2013  . VAGINAL TRICHOMONIASIS 06/28/2006  . HYPERLIPIDEMIA 06/28/2006  . ANEMIA-NOS 06/28/2006  . ANXIETY 06/28/2006  . DEPRESSION 06/28/2006  . SYNDROME, CARPAL TUNNEL 06/28/2006  . HYPERTENSION 06/28/2006  . ALLERGIC RHINITIS 06/28/2006  . ASTHMA 06/28/2006  . PAROTIDITIS 06/28/2006  . LOW BACK PAIN 06/28/2006  . TB SKIN TEST, POSITIVE, HX OF 06/28/2006   Past Medical History  Diagnosis Date  . Lumbar herniated disc   . Sciatica   . Hypertension   . Small bowel obstruction   . COPD (chronic obstructive pulmonary  disease)   . Arthritis   . Depression   . Anxiety   . Bronchitis     Hx: of  . Pneumonia     Hx: of  . Fibromyalgia     Past Surgical History  Procedure Laterality Date  . Abdominal surgery      resection of small intestine  . Back surgery    . Abdominal hysterectomy    . Tonsillectomy    . Dilation and curettage of uterus    . Colonoscopy      Hx; of  . Hernia repair       (Not in a hospital admission) Allergies  Allergen Reactions  . Diclofenac Anaphylaxis  . Codeine Itching  . Hydrocodone Itching    History  Substance Use Topics  . Smoking status: Current Every Day Smoker -- 0.40 packs/day for 25 years    Types: Cigarettes    Last Attempt to Quit: 03/29/2013  . Smokeless tobacco: Never Used     Comment: Currently on the Nicoderm patch."smokes a few"  . Alcohol Use: Yes     Comment: 1 glass of wine occas. (1 every 2 weeks or so)    Family History  Problem Relation Age of Onset  . Hypertension Mother   . Diabetes Mother   . Cancer - Other Mother   . Cancer Sister      Review of Systems  Constitutional: Negative.   HENT: Negative.   Eyes: Negative.   Respiratory: Negative.   Cardiovascular: Negative.   Gastrointestinal: Negative.   Genitourinary: Negative.   Musculoskeletal: Positive for joint pain. Negative for falls.  Skin: Negative.   Neurological: Negative.   Endo/Heme/Allergies: Bruises/bleeds easily.  Psychiatric/Behavioral: Negative.     Objective:  Physical Exam  Constitutional: She is oriented to person, place, and time. She appears well-developed and well-nourished. No distress.  HENT:  Head: Normocephalic and atraumatic.  Nose: Nose normal.  Eyes: Conjunctivae and EOM are normal. Pupils are equal, round, and reactive to light.  Neck: Normal range of motion. Neck supple.  Cardiovascular: Normal rate, regular rhythm, normal heart sounds and intact distal pulses.   No murmur heard. Respiratory: Effort normal and breath sounds normal.  No respiratory distress. She has no wheezes. She has no rales. She exhibits no tenderness.  GI: Soft. Bowel sounds are normal. She exhibits no distension. There is no tenderness.  Musculoskeletal:       Left hip: She exhibits decreased range of motion, decreased strength and tenderness.  Lymphadenopathy:    She has no cervical adenopathy.  Neurological: She is alert and oriented to person, place, and time. No cranial nerve deficit.  Skin: Skin is warm and dry. No rash noted. No erythema.  Psychiatric: She has a normal mood and affect. Her behavior is normal.    Vital signs in last 24 hours: @VSRANGES @  Labs:   Estimated body mass index is 34.52 kg/(m^2) as calculated from the following:   Height as of 03/22/14: 5\' 8"  (1.727 m).   Weight as of 03/22/14: 102.967 kg (227 lb).   Imaging Review Plain radiographs demonstrate severe degenerative joint disease of the left hip(s). The bone quality appears to be good for age and reported activity level.  Assessment/Plan:  End stage arthritis, left hip(s)  The patient history, physical examination, clinical judgement of the provider and imaging studies are consistent with end stage degenerative joint disease of the left hip(s) and total hip arthroplasty is deemed medically necessary. The treatment options including medical management, injection therapy, arthroscopy and arthroplasty were discussed at length. The risks and benefits of total hip arthroplasty were presented and reviewed. The risks due to aseptic loosening, infection, stiffness, dislocation/subluxation,  thromboembolic complications and other imponderables were discussed.  The patient acknowledged the explanation, agreed to proceed with the plan and consent was signed. Patient is being admitted for inpatient treatment for surgery, pain control, PT, OT, prophylactic antibiotics, VTE prophylaxis, progressive ambulation and ADL's and discharge planning.The patient is planning to be  discharged home with home health services

## 2014-05-28 NOTE — Anesthesia Procedure Notes (Signed)
Procedure Name: Intubation Date/Time: 05/28/2014 7:43 AM Performed by: Jenne Campus Pre-anesthesia Checklist: Patient identified, Emergency Drugs available, Suction available, Patient being monitored and Timeout performed Patient Re-evaluated:Patient Re-evaluated prior to inductionOxygen Delivery Method: Circle system utilized Preoxygenation: Pre-oxygenation with 100% oxygen Intubation Type: IV induction Ventilation: Mask ventilation without difficulty Laryngoscope Size: Miller and 2 Grade View: Grade I Tube type: Oral Tube size: 7.0 mm Number of attempts: 1 Airway Equipment and Method: Stylet Placement Confirmation: ETT inserted through vocal cords under direct vision,  positive ETCO2,  CO2 detector and breath sounds checked- equal and bilateral Secured at: 22 cm Tube secured with: Tape Dental Injury: Teeth and Oropharynx as per pre-operative assessment

## 2014-05-29 LAB — CBC
HCT: 29.6 % — ABNORMAL LOW (ref 36.0–46.0)
Hemoglobin: 10 g/dL — ABNORMAL LOW (ref 12.0–15.0)
MCH: 31.8 pg (ref 26.0–34.0)
MCHC: 33.8 g/dL (ref 30.0–36.0)
MCV: 94.3 fL (ref 78.0–100.0)
Platelets: 206 10*3/uL (ref 150–400)
RBC: 3.14 MIL/uL — ABNORMAL LOW (ref 3.87–5.11)
RDW: 14.1 % (ref 11.5–15.5)
WBC: 7.7 10*3/uL (ref 4.0–10.5)

## 2014-05-29 LAB — BASIC METABOLIC PANEL
Anion gap: 15 (ref 5–15)
BUN: 20 mg/dL (ref 6–23)
CO2: 23 mEq/L (ref 19–32)
Calcium: 8.5 mg/dL (ref 8.4–10.5)
Chloride: 98 mEq/L (ref 96–112)
Creatinine, Ser: 0.76 mg/dL (ref 0.50–1.10)
GFR, EST NON AFRICAN AMERICAN: 87 mL/min — AB (ref >90)
Glucose, Bld: 121 mg/dL — ABNORMAL HIGH (ref 70–99)
Potassium: 4.4 mEq/L (ref 3.7–5.3)
Sodium: 136 mEq/L — ABNORMAL LOW (ref 137–147)

## 2014-05-29 MED ORDER — AMLODIPINE BESYLATE 5 MG PO TABS: 5.0000 mg | ORAL_TABLET | Freq: Every day | ORAL | Status: DC

## 2014-05-29 MED ORDER — OXYCODONE-ACETAMINOPHEN 5-325 MG PO TABS: 1.0000 | ORAL_TABLET | ORAL | Status: DC | PRN

## 2014-05-29 MED ORDER — ENOXAPARIN SODIUM 30 MG/0.3ML ~~LOC~~ SOLN: 30.0000 mg | Freq: Two times a day (BID) | SUBCUTANEOUS | Status: DC

## 2014-05-29 NOTE — Progress Notes (Signed)
Physical Therapy Treatment Patient Details Name: Morgan Torres MRN: [id number] DOB: 07/18/1950 Today's Date: 05/29/2014    History of Present Illness Pt is a 63 y.o. female s/p Lt THA 05/28/14.    PT Comments    Pt cont's to progress slowly with mobility.  Limited by dizziness & nausea this session.  RN made aware.  MD present while pt ambulating in room & reporting she felt dizzy.  Assisted to sitting in recliner with LE's elevated.  Increased tx time as pt requesting to use bathroom- unsuccessful at voiding.  Cont with current POC & monitor progress to ensure d/c recommendations appropriate.      Follow Up Recommendations  Home health PT;Supervision/Assistance - 24 hour     Equipment Recommendations  None recommended by PT    Recommendations for Other Services OT consult     Precautions / Restrictions Precautions Precautions: Posterior Hip;Fall Precaution Comments: given handout and reviewed precautions with family Required Braces or Orthoses: Knee Immobilizer - Left Restrictions LLE Weight Bearing: Weight bearing as tolerated    Mobility  Bed Mobility Overal bed mobility: Needs Assistance Bed Mobility: Supine to Sit     Supine to sit: Mod assist;HOB elevated     General bed mobility comments: (A) for LLE & to lift shoulders/trunk to sitting upright.  cues for technique/sequencing.    Transfers Overall transfer level: Needs assistance Equipment used: Rolling walker (2 wheeled) Transfers: Sit to/from Stand Sit to Stand: Min assist         General transfer comment: cues for hand placement, LLE positioning before sitting, & controlled descent  Ambulation/Gait Ambulation/Gait assistance: Min guard Ambulation Distance (Feet): 15 Feet Assistive device: Rolling walker (2 wheeled) Gait Pattern/deviations: Step-to pattern;Trunk flexed;Decreased weight shift to left;Decreased stance time - left;Decreased step length - right     General Gait Details: Distance  limited by pt reporting dizziness.  Unable to get BP due to dynamap unavailable.     Stairs            Wheelchair Mobility    Modified Rankin (Stroke Patients Only)       Balance                                    Cognition Arousal/Alertness: Awake/alert Behavior During Therapy: Flat affect Overall Cognitive Status: Within Functional Limits for tasks assessed                      Exercises Total Joint Exercises Ankle Circles/Pumps: AROM;Both;15 reps Gluteal Sets: AROM;Strengthening;Both;10 reps Heel Slides: AAROM;Strengthening;Left;10 reps Hip ABduction/ADduction: AAROM;Strengthening;Left;10 reps Long Arc Quad: AROM;Left;10 reps Marching in Standing: AROM;Both;5 reps;Standing    General Comments        Pertinent Vitals/Pain Pain Assessment: 0-10 Pain Score: 7  Pain Location: Lt hip Pain Descriptors / Indicators: Aching Pain Intervention(s): Limited activity within patient's tolerance;Monitored during session;Repositioned    Home Living                      Prior Function            PT Goals (current goals can now be found in the care plan section) Acute Rehab PT Goals Patient Stated Goal: to go home  PT Goal Formulation: With patient Time For Goal Achievement: 06/04/14 Potential to Achieve Goals: Good Progress towards PT goals: Progressing toward goals (slowly)    Frequency  7X/week  PT Plan Current plan remains appropriate    Co-evaluation             End of Session Equipment Utilized During Treatment: Gait belt Activity Tolerance: Patient limited by pain;Other (comment) (c/o dizziness) Patient left: in chair;with call bell/phone within reach;with family/visitor present;with nursing/sitter in room     Time: [phone] PT Time Calculation (min) (ACUTE ONLY): 24 min  Charges:  $Gait Training: 8-22 mins $Therapeutic Activity: 8-22 mins                    G Codes:      Sena Hitch 05/29/2014,  1:29 PM   Sarajane Marek, [place] [phone] 05/29/2014

## 2014-05-29 NOTE — Progress Notes (Signed)
Subjective: 1 Day Post-Op Procedure(s) (LRB): LEFT TOTAL HIP ARTHROPLASTY (Left) Patient reports pain as moderate.    Objective: Vital signs in last 24 hours: Temp:  [97.4 F (36.3 C)-97.8 F (36.6 C)] 97.8 F (36.6 C) (12/05 0557) Pulse Rate:  [62-79] 62 (12/05 1049) Resp:  [14-16] 16 (12/05 0557) BP: (106-167)/(38-87) 106/38 mmHg (12/05 1049) SpO2:  [95 %-100 %] 100 % (12/05 0557)  Intake/Output from previous day: 12/04 0701 - 12/05 0700 In: 3732.5 [P.O.:400; I.V.:3332.5] Out: 2150 [Urine:1800; Blood:350] Intake/Output this shift:     Recent Labs  05/28/14 1345 05/29/14 0730  HGB 12.8 10.0*    Recent Labs  05/28/14 1345 05/29/14 0730  WBC 15.5* 7.7  RBC 3.94 3.14*  HCT 36.7 29.6*  PLT 223 206    Recent Labs  05/28/14 1345 05/29/14 0730  NA  --  136*  K  --  4.4  CL  --  98  CO2  --  23  BUN  --  20  CREATININE 0.73 0.76  GLUCOSE  --  121*  CALCIUM  --  8.5   No results for input(s): LABPT, INR in the last 72 hours.  Neurovascular intact Sensation intact distally Intact pulses distally Incision: dressing C/D/I  Assessment/Plan: 1 Day Post-Op Procedure(s) (LRB): LEFT TOTAL HIP ARTHROPLASTY (Left) Up with therapy WBAT LLE DVT proph lovenox Pain med as ordered Hopeful d/c home with HHPT if doing well tomorrow or possibly Monday   Morgan Torres 05/29/2014, 11:34 AM

## 2014-05-29 NOTE — Progress Notes (Signed)
CARE MANAGEMENT NOTE 05/29/2014  Patient:  Morgan Torres, Morgan Torres   Account Number:  [id number]  Date Initiated:  05/29/2014  Documentation initiated by:  Ori Kreiter  Subjective/Objective Assessment:   63 yr old female s/p left THR     Action/Plan:   CM spoke to patient about home health needs, choice list given, referral called to [facility] /Per patient has 3 in 1 , rolling walker, shower chair   Anticipated DC Date:  05/30/2014   Anticipated DC Plan:  [place]  CM consult      [facility] Choice  HOME HEALTH   Choice offered to / List presented to:  C-1 Patient   DME arranged  NA        [place] arranged  HH-2 PT      [place].   Status of service:  In process, will continue to follow Medicare Important Message given?   (If response is "NO", the following Medicare IM given date fields will be blank) Date Medicare IM given:   Medicare IM given by:   Date Additional Medicare IM given:   Additional Medicare IM given by:    Discharge Disposition:    Per UR Regulation:    If discussed at Long Length of Stay Meetings, dates discussed:    Comments:

## 2014-05-29 NOTE — Op Note (Signed)
NAME:  Morgan Torres, TRUEHEART NO.:  [id number]  MEDICAL RECORD NO.:  [id number]  LOCATION:  [facility]                         FACILITY:  [facility]  PHYSICIAN:  Lockie Pares, M.D.    DATE OF BIRTH:  [birth year]  DATE OF PROCEDURE:  05/28/2014 DATE OF DISCHARGE:                              OPERATIVE REPORT   PREOPERATIVE DIAGNOSIS:  Osteoarthritis, left hip.  POSTOPERATIVE DIAGNOSIS:  Osteoarthritis, left hip.  OPERATION:  Left total hip replacement (AML 15 mm fully porous-coated prosthesis with small stature +1.5 mm neck length, 36 mm hip ball ceramic with 52 mm Sector cup with Gription with 10 degree lip liner).  SURGEON:  Lockie Pares, M.D.  ASSISTANTMarjo Bicker, PA.  BLOOD LOSS:  Approximately 400.  DESCRIPTION OF PROCEDURE:  Lateral positioning, posterior approach to the hip was made with splitting of the iliotibial band, gluteus maximus fascia.  We split the short external rotators, did a T-capsulotomy of the hip, dislocated the hip, resected the head.  Significant arthritis was appreciated, cut the neck about 1 fingerbreadth above the lesser trochanter, followed by progressive reaming and rasping up to accept a 15 mm small stature stem.  Attention was next directed to acetabulum.  Acetabulum was placed anteriorly inferiorly and 2 wing retractors superiorly and posteriorly. Soft tissue was resected from the acetabulum including the labrum and then we progressively reamed the acetabulum for 1 mm, under reaming for a 51 mm reamer to accept a 52 mm cup.  Final cup was inserted with approximately 15 degrees of anteversion and 45-50 degrees of abduction, followed by trial liner.  We then trialed with the broach on the femoral side and could get in the minus and the plus 1.5 mm trial.  We then replaced the trial liner on the acetabular side with the final liner with the apex screw inserted as well with the sector cup.  Did not feel we needed to put any screws in due  to the good stability noted.  The final prosthesis was inserted and then we trialed again, settled up to 1.5 mm.  We were able to use the 36 mm ceramic head ball due the fact that we inserted a 52 mm acetabulum.  Most of the capsule had been removed as part of the exposure.  We closed the fascia with a running #1 Ethibond, followed by 0 and 2-0 Vicryl and staples on the skin, Marcaine with epinephrine in the skin, taken to recovery room in stable condition.     Lockie Pares, M.D.     WDC/MEDQ  D:  05/28/2014  T:  05/28/2014  Job:  [id number]

## 2014-05-29 NOTE — Plan of Care (Signed)
Problem: Phase I Progression Outcomes Goal: Pain controlled with appropriate interventions Outcome: Completed/Met Date Met:  05/29/14     

## 2014-05-29 NOTE — Progress Notes (Signed)
Physical Therapy Treatment Patient Details Name: Morgan Torres MRN: [id number] DOB: 01-Jun-1951 Today's Date: 05/29/2014    History of Present Illness Pt is a 63 y.o. female s/p Lt THA 05/28/14.    PT Comments    Pt progressing slowly with mobility at this time as she cont'd to require mod assist to transition supine>sitting & achieve standing, & increase ambulation distance short amount.  Cont with current POC.     Follow Up Recommendations  Home health PT;Supervision/Assistance - 24 hour     Equipment Recommendations  None recommended by PT    Recommendations for Other Services OT consult     Precautions / Restrictions Precautions Precautions: Posterior Hip;Fall Precaution Comments: given handout and reviewed precautions with family Required Braces or Orthoses: Knee Immobilizer - Left Restrictions Weight Bearing Restrictions: Yes LLE Weight Bearing: Weight bearing as tolerated    Mobility  Bed Mobility Overal bed mobility: Needs Assistance Bed Mobility: Supine to Sit     Supine to sit: Mod assist;HOB elevated     General bed mobility comments: (A) for LLE & to lift shoulders/trunk to sitting upright.  cues for technique/sequencing.    Transfers Overall transfer level: Needs assistance Equipment used: Rolling walker (2 wheeled) Transfers: Stand Pivot Transfers Sit to Stand: Mod assist         General transfer comment: cues for hand placement.  (A) to achieve standing.    Ambulation/Gait Ambulation/Gait assistance: Min guard Ambulation Distance (Feet): 10 Feet Assistive device: Rolling walker (2 wheeled) Gait Pattern/deviations: Step-to pattern;Decreased stance time - left;Decreased weight shift to left     General Gait Details: Performed pregait standing at EOB: weight shifting & marching.  Then ambulated bed>window with cues for sequencing & safety with RW.  Pt fatigues quickly.     Stairs            Wheelchair Mobility    Modified Rankin  (Stroke Patients Only)       Balance                                    Cognition Arousal/Alertness: Awake/alert Behavior During Therapy: Flat affect Overall Cognitive Status: Within Functional Limits for tasks assessed                      Exercises Total Joint Exercises Ankle Circles/Pumps: AROM;Both;15 reps Gluteal Sets: AROM;Strengthening;Both;10 reps Heel Slides: AAROM;Strengthening;Left;10 reps Hip ABduction/ADduction: AAROM;Strengthening;Left;10 reps Long Arc Quad: AROM;Left;10 reps Marching in Standing: AROM;Both;5 reps;Standing    General Comments        Pertinent Vitals/Pain Pain Assessment: 0-10 Pain Score: 8  Pain Location: lt hip Pain Descriptors / Indicators: Aching Pain Intervention(s): Monitored during session;Repositioned;Premedicated before session    Home Living                      Prior Function            PT Goals (current goals can now be found in the care plan section) Acute Rehab PT Goals Patient Stated Goal: to go home  PT Goal Formulation: With patient Time For Goal Achievement: 06/04/14 Potential to Achieve Goals: Good Progress towards PT goals: Progressing toward goals    Frequency  7X/week    PT Plan Current plan remains appropriate    Co-evaluation             End of Session   Activity Tolerance:  Patient tolerated treatment well Patient left: in chair;with call bell/phone within reach     Time: 0957-1022 PT Time Calculation (min) (ACUTE ONLY): 25 min  Charges:  $Gait Training: 8-22 mins $Therapeutic Activity: 8-22 mins                    G Codes:      Sena Hitch 05/29/2014, 10:40 AM   Sarajane Marek, PTA [phone] 05/29/2014

## 2014-05-29 NOTE — Evaluation (Signed)
Occupational Therapy Evaluation Patient Details Name: Morgan Torres MRN: [id number] DOB: 04/07/1951 Today's Date: 05/29/2014    History of Present Illness Pt is a 63 y.o. female s/p Lt THA 05/28/14.   Clinical Impression   This 63 yo female admitted and underwent above presents to acute OT with increased pain, decreased mobility, decreased balance, posterior hip precautions all affecting her ability to care for herself at home. She will benefit from continued acute OT with follow up [place] (unless progress remains slow and then she will need STSNF) to get to a S/Mod I level.    Follow Up Recommendations  No OT follow up (unless progress remains slow then will need SNF for therapies)    Equipment Recommendations  None recommended by OT       Precautions / Restrictions Precautions Precautions: Posterior Hip;Fall Precaution Comments: given handout and reviewed precautions with family Required Braces or Orthoses: Knee Immobilizer - Left Knee Immobilizer - Left:  (in bed for hip precautions) Restrictions Weight Bearing Restrictions: No LLE Weight Bearing: Weight bearing as tolerated      Mobility Bed Mobility Overal bed mobility: Needs Assistance Bed Mobility: Supine to Sit     Supine to sit: Mod assist (with rail, HOB flat, out on her right (as she would do at home))     General bed mobility comments: VCs for sequencing and to bring trunk up  Transfers Overall transfer level: Needs assistance Equipment used: Rolling walker (2 wheeled) Transfers: Sit to/from Stand Sit to Stand: Min assist         General transfer comment: cues for hand placement, LLE positioning before sitting, & controlled descent         ADL Overall ADL's : Needs assistance/impaired Eating/Feeding: Independent;Sitting   Grooming: Set up;Sitting   Upper Body Bathing: Set up;Sitting   Lower Body Bathing: Maximal assistance (with min A sit<>stand)   Upper Body Dressing : Set up;Sitting    Lower Body Dressing: Total assistance (with min A sit<>stand)   Toilet Transfer: Minimal assistance;Stand-pivot;RW;BSC (with A for LLE)   Toileting- Clothing Manipulation and Hygiene: Min guard (with min A sit<>stand)                         Pertinent Vitals/Pain Pain Assessment: 0-10 Pain Score: 7  Pain Location: left hip Pain Descriptors / Indicators: Aching;Sore Pain Intervention(s): Monitored during session;Repositioned;Premedicated before session     Hand Dominance Right   Extremity/Trunk Assessment Upper Extremity Assessment Upper Extremity Assessment: Overall WFL for tasks assessed           Communication Communication Communication: No difficulties   Cognition Arousal/Alertness: Awake/alert Behavior During Therapy: WFL for tasks assessed/performed Overall Cognitive Status: Within Functional Limits for tasks assessed                                Home Living Family/patient expects to be discharged to:: Private residence (may need SNF is progress remains slow) Living Arrangements: Other relatives;Non-relatives/Friends Available Help at Discharge: Friend(s);Family;Available 24 hours/day Type of Home: House Home Access: Stairs to enter [employer] of Steps: 2 Entrance Stairs-Rails: Right Home Layout: One level     Bathroom Shower/Tub: Tub/shower unit;Curtain Shower/tub characteristics: Architectural technologist: Standard     Home Equipment: Environmental consultant - 2 wheels;Bedside commode;Shower seat;Adaptive equipment Adaptive Equipment: Reacher;Sock aid;Long-handled shoe horn;Long-handled sponge Additional Comments: all DME from previous surgeries       Prior  Functioning/Environment Level of Independence: Independent with assistive device(s)        Comments: ambulated with cane as needed     OT Diagnosis: Generalized weakness;Acute pain   OT Problem List: Decreased strength;Decreased range of motion;Impaired balance (sitting  and/or standing);Pain;Decreased knowledge of use of DME or AE;Decreased knowledge of precautions   OT Treatment/Interventions: Self-care/ADL training;DME and/or AE instruction;Therapeutic activities;Patient/family education;Balance training    OT Goals(Current goals can be found in the care plan section) Acute Rehab OT Goals Patient Stated Goal: to go home  OT Goal Formulation: With patient Time For Goal Achievement: 06/05/14 Potential to Achieve Goals: Good  OT Frequency: Min 2X/week              End of Session Equipment Utilized During Treatment: Rolling walker  Activity Tolerance: Patient limited by pain (pre-medicated) Patient left: in bed;with call bell/phone within reach;with family/visitor present   Time: 8182-9937 OT Time Calculation (min): 26 min Charges:  OT General Charges $OT Visit: 1 Procedure OT Evaluation $Initial OT Evaluation Tier I: 1 Procedure OT Treatments $Self Care/Home Management : 8-22 mins  Almon Register  [phone]  05/29/2014, 3:27 PM

## 2014-05-30 LAB — CBC
HCT: 30.3 % — ABNORMAL LOW (ref 36.0–46.0)
Hemoglobin: 9.8 g/dL — ABNORMAL LOW (ref 12.0–15.0)
MCH: 30.7 pg (ref 26.0–34.0)
MCHC: 32.3 g/dL (ref 30.0–36.0)
MCV: 95 fL (ref 78.0–100.0)
Platelets: 185 10*3/uL (ref 150–400)
RBC: 3.19 MIL/uL — AB (ref 3.87–5.11)
RDW: 14.2 % (ref 11.5–15.5)
WBC: 9.2 10*3/uL (ref 4.0–10.5)

## 2014-05-30 NOTE — Progress Notes (Signed)
Physical Therapy Treatment Patient Details Name: Morgan Torres MRN: [id number] DOB: 06-04-1951 Today's Date: 05/30/2014    History of Present Illness Pt is a 63 y.o. female s/p Lt THA 05/28/14.    PT Comments    Pt cont's to progress slowly with mobility.  Limited by dizziness.  D/c plans may need to be updated to SNF if does not progress.    Follow Up Recommendations  Home health PT;Supervision/Assistance - 24 hour (may need SNF if does not progress)     Equipment Recommendations  None recommended by PT    Recommendations for Other Services OT consult     Precautions / Restrictions Precautions Precautions: Posterior Hip;Fall Required Braces or Orthoses: Knee Immobilizer - Left Restrictions LLE Weight Bearing: Weight bearing as tolerated    Mobility  Bed Mobility Overal bed mobility: Needs Assistance Bed Mobility: Supine to Sit     Supine to sit: Mod assist     General bed mobility comments: very effortful.  (A) for LLE.  Increased time to bring trunk/shoulders to sitting upright.  HOB flat with no rails.    Transfers Overall transfer level: Needs assistance Equipment used: Rolling walker (2 wheeled) Transfers: Sit to/from Stand Sit to Stand: Min assist         General transfer comment: cues for hand placement.  Multiple attempts to get positioned correctly & achieve standing from bed but stood on first attempt from 3-in-1  Ambulation/Gait Ambulation/Gait assistance: Min guard Ambulation Distance (Feet): 10 Feet Assistive device: Rolling walker (2 wheeled) Gait Pattern/deviations: Step-to pattern;Decreased step length - right;Decreased step length - left;Decreased weight shift to left     General Gait Details: Cues for sequencing.  Distance limited by dizziness.     Stairs            Wheelchair Mobility    Modified Rankin (Stroke Patients Only)       Balance                                    Cognition Arousal/Alertness:  Awake/alert Behavior During Therapy: Flat affect Overall Cognitive Status: Within Functional Limits for tasks assessed                      Exercises      General Comments        Pertinent Vitals/Pain Pain Assessment: 0-10 Pain Score: 8  Pain Location: Lt hip Pain Descriptors / Indicators: Aching Pain Intervention(s): Limited activity within patient's tolerance;Monitored during session;Repositioned;Other (comment) (Notified RN)    Home Living                      Prior Function            PT Goals (current goals can now be found in the care plan section) Acute Rehab PT Goals Patient Stated Goal: to go home  PT Goal Formulation: With patient Time For Goal Achievement: 06/04/14 Potential to Achieve Goals: Good Progress towards PT goals: Progressing toward goals (slowly)    Frequency  7X/week    PT Plan Current plan remains appropriate (may need to update if does not progress)    Co-evaluation             End of Session Equipment Utilized During Treatment: Gait belt Activity Tolerance: Patient limited by pain;Other (comment) (dizziness) Patient left: in chair;with call bell/phone within reach  Time: 4136-4383 PT Time Calculation (min) (ACUTE ONLY): 25 min  Charges:  $Therapeutic Activity: 23-37 mins                    G Codes:      Sena Hitch 05/30/2014, 12:39 PM   Sarajane Marek, PTA [phone] 05/30/2014

## 2014-05-30 NOTE — Progress Notes (Signed)
Physical Therapy Treatment Patient Details Name: Morgan Torres MRN: [id number] DOB: 02-Jan-1951 Today's Date: 05/30/2014    History of Present Illness Pt is a 63 y.o. female s/p Lt THA 05/28/14.    PT Comments    Pt ambulated ~20' this session but all mobility appears very effortful.  Limited by pain & dizziness per pt however BP WNL with positional changes & after ambulating.  Cont with current POC.    Follow Up Recommendations  Home health PT;Supervision/Assistance - 24 hour     Equipment Recommendations  None recommended by PT    Recommendations for Other Services OT consult     Precautions / Restrictions Precautions Precautions: Posterior Hip;Fall Precaution Comments: Pt able to verbalize 2/3 precautions.  Reviewed all precautions Required Braces or Orthoses: Knee Immobilizer - Left Knee Immobilizer - Left: Other (comment) (in bed for hip precautions) Restrictions LLE Weight Bearing: Weight bearing as tolerated    Mobility  Bed Mobility Overal bed mobility: Needs Assistance Bed Mobility: Sit to Supine     Supine to sit: Mod assist Sit to supine: Mod assist   General bed mobility comments: (A) to lift LE's back into bed.  Cues for technique & use of UE's.   Transfers Overall transfer level: Needs assistance Equipment used: Rolling walker (2 wheeled) Transfers: Sit to/from Stand Sit to Stand: Min assist         General transfer comment: Effortful but able to stand with min assist.    Ambulation/Gait Ambulation/Gait assistance: Min guard Ambulation Distance (Feet): 20 Feet Assistive device: Rolling walker (2 wheeled) Gait Pattern/deviations: Step-to pattern;Decreased weight shift to left;Decreased step length - right Gait velocity: decreased   General Gait Details: ambulated ~5' then needing to sit due to dizziness.  BP 151/43.  seated rest break x3 mins then able to ambulate ~20'.  Cont's to be effortful & reports dizziness.  Pt's husband followed with  recliner.     Stairs            Wheelchair Mobility    Modified Rankin (Stroke Patients Only)       Balance                                    Cognition Arousal/Alertness: Awake/alert Behavior During Therapy: Flat affect Overall Cognitive Status: Within Functional Limits for tasks assessed                      Exercises Total Joint Exercises Ankle Circles/Pumps: AROM;Both;10 reps Gluteal Sets: AROM;Both;10 reps    General Comments        Pertinent Vitals/Pain Pain Assessment: 0-10 Pain Score: 6  Pain Location: Lt hip Pain Descriptors / Indicators: Aching Pain Intervention(s): Limited activity within patient's tolerance;Monitored during session;Repositioned    Home Living                      Prior Function            PT Goals (current goals can now be found in the care plan section) Acute Rehab PT Goals Patient Stated Goal: to go home  PT Goal Formulation: With patient Time For Goal Achievement: 06/04/14 Potential to Achieve Goals: Good Progress towards PT goals: Progressing toward goals (slowly)    Frequency  7X/week    PT Plan Current plan remains appropriate    Co-evaluation  End of Session Equipment Utilized During Treatment: Gait belt Activity Tolerance: Patient limited by pain;Other (comment) (dizziness) Patient left: in bed;with call bell/phone within reach;with family/visitor present     Time: 8466-5993 PT Time Calculation (min) (ACUTE ONLY): 28 min  Charges:  $Gait Training: 8-22 mins $Therapeutic Activity: 8-22 mins                    G Codes:      Sena Hitch 05/30/2014, 2:11 PM   Sarajane Marek, [place] [phone] 05/30/2014

## 2014-05-30 NOTE — Progress Notes (Signed)
Occupational Therapy Treatment Patient Details Name: Morgan Torres MRN: [id number] DOB: Jan 02, 1951 Today's Date: 05/30/2014    History of present illness Pt is a 63 y.o. female s/p Lt THA 05/28/14.   OT comments  Pt limited due to dizziness. Education provided. Pt states she would feel better about going SNF versus home.  Follow Up Recommendations  SNF    Equipment Recommendations  None recommended by OT    Recommendations for Other Services      Precautions / Restrictions Precautions Precautions: Posterior Hip;Fall Precaution Comments: Reviewed precautions. Required Braces or Orthoses: Knee Immobilizer - Left Knee Immobilizer - Left: Other (comment) (in bed for hip precautions) Restrictions Weight Bearing Restrictions: Yes LLE Weight Bearing: Weight bearing as tolerated       Mobility Bed Mobility Overal bed mobility: Needs Assistance Bed Mobility: Supine to Sit     Supine to sit: Min assist Sit to supine: Min assist   General bed mobility comments: Assist with LLE. Tried to use blanket to help assist LLE, but OT still assisted.   Transfers Overall transfer level: Needs assistance Equipment used: Rolling walker (2 wheeled) Transfers: Sit to/from [facility] Sit to Stand: Min assist;Min guard Stand pivot transfers: Min guard       General transfer comment: Assist to stand from bed. Pt did better standing from 3 in 1.     Balance                                   ADL Overall ADL's : Needs assistance/impaired                         Toilet Transfer: Min guard;Stand-pivot;RW;BSC   Toileting- Clothing Manipulation and Hygiene: Min guard;Sit to/from stand       Functional mobility during ADLs: Min guard;Rolling walker (stand pivot) General ADL Comments: Educated on LB dressing technique. Briefly mentioned AE. Pt limited due to dizziness.  Educated on safety tips (use of bag on walker and safe shoewear).       Vision                     Perception     Praxis      Cognition  Awake/Alert Behavior During Therapy: Flat affect Overall Cognitive Status: Within Functional Limits for tasks assessed                       Extremity/Trunk Assessment                  Shoulder Instructions       General Comments      Pertinent Vitals/ Pain       Pain Assessment: 0-10 Pain Score: 7  Pain Location: left hip  Pain Intervention(s): Limited activity within patient's tolerance;Repositioned  Home Living                                          Prior Functioning/Environment              Frequency Min 2X/week     Progress Toward Goals  OT Goals(current goals can now be found in the care plan section)  Progress towards OT goals: Progressing toward goals  Acute Rehab OT Goals Patient Stated Goal: not stated  OT Goal Formulation: With patient Time For Goal Achievement: 06/05/14 Potential to Achieve Goals: Good ADL Goals Pt Will Perform Grooming: with set-up;with supervision;standing (2 tasks at sink) Pt Will Perform Lower Body Dressing: with set-up;with supervision;with adaptive equipment;sit to/from stand Pt Will Transfer to Toilet: with supervision;ambulating;bedside commode (over toilet) Pt Will Perform Toileting - Clothing Manipulation and hygiene: with supervision;sit to/from stand Pt Will Perform Tub/Shower Transfer: Tub transfer;ambulating;rolling walker;3 in 1 Additional ADL Goal #1: Pt will be S in and OOB on right side with HOB down and no rail  Plan Discharge plan needs to be updated    Co-evaluation                 End of Session Equipment Utilized During Treatment: Gait belt;Rolling walker;Left knee immobilizer (immobilizer in bed)   Activity Tolerance Other (comment) (dizziness)   Patient Left in bed;with call bell/phone within reach;with family/visitor present   Nurse Communication          Time:  6759-1638 OT Time Calculation (min): 17 min  Charges: OT General Charges $OT Visit: 1 Procedure OT Treatments $Self Care/Home Management : 8-22 mins  Benito Mccreedy OTR/L [phone] 05/30/2014, 5:51 PM

## 2014-05-30 NOTE — Progress Notes (Signed)
Utilization Review Completed.   Leesha Veno, RN, BSN Nurse Case Manager  

## 2014-05-30 NOTE — Progress Notes (Signed)
Subjective: 2 Days Post-Op Procedure(s) (LRB): LEFT TOTAL HIP ARTHROPLASTY (Left) Patient reports pain as moderate.  Slow with mobility.  Objective: Vital signs in last 24 hours: Temp:  [97.9 F (36.6 C)-98.9 F (37.2 C)] 98.9 F (37.2 C) (12/06 0612) Pulse Rate:  [69-81] 80 (12/06 1025) Resp:  [16-18] 18 (12/06 0800) BP: (106-137)/(35-48) 137/35 mmHg (12/06 1011) SpO2:  [94 %-98 %] 94 % (12/06 0800)  Intake/Output from previous day: 12/05 0701 - 12/06 0700 In: 1620 [P.O.:720; I.V.:900] Out: 0  Intake/Output this shift:     Recent Labs  05/28/14 1345 05/29/14 0730 05/30/14 0655  HGB 12.8 10.0* 9.8*    Recent Labs  05/29/14 0730 05/30/14 0655  WBC 7.7 9.2  RBC 3.14* 3.19*  HCT 29.6* 30.3*  PLT 206 185    Recent Labs  05/28/14 1345 05/29/14 0730  NA  --  136*  K  --  4.4  CL  --  98  CO2  --  23  BUN  --  20  CREATININE 0.73 0.76  GLUCOSE  --  121*  CALCIUM  --  8.5   No results for input(s): LABPT, INR in the last 72 hours.  Neurovascular intact Sensation intact distally Intact pulses distally Dorsiflexion/Plantar flexion intact Incision: dressing C/D/I, scant drainage and dressing changed No cellulitis present  Assessment/Plan: 2 Days Post-Op Procedure(s) (LRB): LEFT TOTAL HIP ARTHROPLASTY (Left) Up with therapy Plan for discharge tomorrow Discharge home with home health  Chriss Czar 05/30/2014, 10:59 AM

## 2014-05-31 ENCOUNTER — Encounter (HOSPITAL_COMMUNITY): Payer: Self-pay | Admitting: Orthopedic Surgery

## 2014-05-31 LAB — CBC
HEMATOCRIT: 26.5 % — AB (ref 36.0–46.0)
Hemoglobin: 8.7 g/dL — ABNORMAL LOW (ref 12.0–15.0)
MCH: 30.9 pg (ref 26.0–34.0)
MCHC: 32.8 g/dL (ref 30.0–36.0)
MCV: 94 fL (ref 78.0–100.0)
Platelets: 177 10*3/uL (ref 150–400)
RBC: 2.82 MIL/uL — ABNORMAL LOW (ref 3.87–5.11)
RDW: 14 % (ref 11.5–15.5)
WBC: 7.4 10*3/uL (ref 4.0–10.5)

## 2014-05-31 NOTE — Plan of Care (Signed)
Problem: Phase I Progression Outcomes Goal: Initial discharge plan identified Outcome: Completed/Met Date Met:  05/31/14 Goal: Hemodynamically stable Outcome: Completed/Met Date Met:  05/31/14

## 2014-05-31 NOTE — Progress Notes (Signed)
Occupational Therapy Treatment Patient Details Name: Morgan Torres MRN: [id number] DOB: 04-11-1951 Today's Date: 05/31/2014    History of present illness Pt is a 63 y.o. female s/p Lt THA 05/28/14.   OT comments  Pt. Making slow progress with OT goals.  Limited by L hip pain and poor endurance with functional mobility.  Pt. Is requesting snf placement for continued therapy prior to d/c home.  Agree with this as pt. Is not currently able to perform necessary ADLS safely to return home.  Spouse present and does not feel he could manage her in her current level either.    Follow Up Recommendations  SNF , pt. Requesting [facility] if possible as it is convenient to where she lives.      Equipment Recommendations  None recommended by OT    Recommendations for Other Services      Precautions / Restrictions Precautions Precautions: Posterior Hip;Fall Precaution Comments: Reviewed precautions. Required Braces or Orthoses: Knee Immobilizer - Left Restrictions LLE Weight Bearing: Weight bearing as tolerated       Mobility Bed Mobility               General bed mobility comments: in recliner upon arrival.  encouraged sitting with B feet on the floor. pt. tolerated less than 3 min. and wanted chair to be reclined.    Transfers Overall transfer level: Needs assistance Equipment used: Rolling walker (2 wheeled) Transfers: Sit to/from [facility] Sit to Stand: Min assist Stand pivot transfers: Min assist       General transfer comment: cues and intermittent tactile guidance to promote upright trunk posture and use of B ue support through walker.  cues also required for proper hand placement durin pivot to toilet to ensure safe descend to 3n1 over commode    Balance                                   ADL Overall ADL's : Needs assistance/impaired     Grooming: Wash/dry hands;Minimal assistance;Standing                   Toilet  Transfer: Minimal assistance;Cueing for sequencing;Cueing for safety;Ambulation;BSC;Regular Toilet;Grab bars Toilet Transfer Details (indicate cue type and reason): amb. to b.room with RW, used 3n1 over commode with heavy reliance on grab bars, max cues for safety, sequencing, and technique.  placed small trashcan for LLE elevation during toileting tasks Toileting- Clothing Manipulation and Hygiene: Minimal assistance;Sit to/from stand         General ADL Comments: currently min a for adls.  pain is major factor and pt. has poor tolerance and endurance with mobility.  requesting SNF placement for more therapy prior to d/c home. spouse pt. and agrees.  rn notified      Vision                     Perception     Praxis      Cognition   Behavior During Therapy: Flat affect Overall Cognitive Status: Within Functional Limits for tasks assessed                       Extremity/Trunk Assessment               Exercises     Shoulder Instructions       General Comments      Pertinent Vitals/  Pain       Pain Assessment: 0-10 Pain Score: 8  Pain Location: L hip Pain Descriptors / Indicators: Aching Pain Intervention(s): Ice applied  Home Living                                          Prior Functioning/Environment              Frequency Min 2X/week     Progress Toward Goals  OT Goals(current goals can now be found in the care plan section)  Progress towards OT goals: Progressing toward goals     Plan Discharge plan remains appropriate    Co-evaluation                 End of Session Equipment Utilized During Treatment: Gait belt;Rolling walker   Activity Tolerance Patient limited by pain   Patient Left in chair;with call bell/phone within reach;with family/visitor present   Nurse Communication Other (comment) (need for d/c changes snf vs home now)        Time: 0930-1005 OT Time Calculation (min): 35  min  Charges: OT General Charges $OT Visit: 1 Procedure OT Treatments $Self Care/Home Management : 23-37 mins  Janice Coffin, COTA/L 05/31/2014, 10:14 AM

## 2014-05-31 NOTE — Progress Notes (Signed)
Subjective: 3 Days Post-Op Procedure(s) (LRB): LEFT TOTAL HIP ARTHROPLASTY (Left) Patient reports pain as moderate.  Reports difficulty with mobility.  PT now recommending SNF instead of HHPT.  Objective: Vital signs in last 24 hours: Temp:  [98.3 F (36.8 C)-99.3 F (37.4 C)] 98.9 F (37.2 C) (12/07 1434) Pulse Rate:  [79-86] 79 (12/07 1434) Resp:  [18-20] 18 (12/07 1434) BP: (127-146)/(36-47) 127/36 mmHg (12/07 1434) SpO2:  [93 %-97 %] 97 % (12/07 1434)  Intake/Output from previous day: 12/06 0701 - 12/07 0700 In: 720 [P.O.:720] Out: -  Intake/Output this shift: Total I/O In: 210 [P.O.:210] Out: -    Recent Labs  05/29/14 0730 05/30/14 0655 05/31/14 0705  HGB 10.0* 9.8* 8.7*    Recent Labs  05/30/14 0655 05/31/14 0705  WBC 9.2 7.4  RBC 3.19* 2.82*  HCT 30.3* 26.5*  PLT 185 177    Recent Labs  05/29/14 0730  NA 136*  K 4.4  CL 98  CO2 23  BUN 20  CREATININE 0.76  GLUCOSE 121*  CALCIUM 8.5   No results for input(s): LABPT, INR in the last 72 hours.  Neurovascular intact Sensation intact distally Intact pulses distally Dorsiflexion/Plantar flexion intact Incision: dressing C/D/I  Assessment/Plan: 3 Days Post-Op Procedure(s) (LRB): LEFT TOTAL HIP ARTHROPLASTY (Left)  Consult SW for SNF placement  Up with therapy Discharge to SNF when bed available WBAT LLE, Post hip precautions   Chriss Czar 05/31/2014, 4:34 PM

## 2014-05-31 NOTE — Progress Notes (Signed)
Physical Therapy Treatment Patient Details Name: Morgan Torres MRN: [id number] DOB: 1951/05/30 Today's Date: 05/31/2014    History of Present Illness Pt is a 63 y.o. female s/p Lt THA 05/28/14.    PT Comments    Session focused on therex, with noted difficulty with L hip AROM and muscle activation around hip joint due to pain;  Continue to recommend SNF for post-acute therapy needs.   Follow Up Recommendations  SNF     Equipment Recommendations  None recommended by PT    Recommendations for Other Services       Precautions / Restrictions Precautions Precautions: Posterior Hip;Fall Precaution Comments: Reviewed precautions. Restrictions LLE Weight Bearing: Weight bearing as tolerated    Mobility  Bed Mobility                  Transfers                    Ambulation/Gait                 Stairs            Wheelchair Mobility    Modified Rankin (Stroke Patients Only)       Balance                                    Cognition Arousal/Alertness: Awake/alert Behavior During Therapy: Flat affect Overall Cognitive Status: Within Functional Limits for tasks assessed                      Exercises Total Joint Exercises Ankle Circles/Pumps: AROM;Both;10 reps Quad Sets: AROM;Left;10 reps Gluteal Sets: AROM;Both;10 reps Towel Squeeze: AROM;Left;10 reps Heel Slides: AAROM;Strengthening;Left;10 reps Hip ABduction/ADduction: AAROM;Strengthening;Left;10 reps    General Comments        Pertinent Vitals/Pain Pain Assessment: 0-10 Pain Score: 8  Pain Location: L hip Pain Descriptors / Indicators: Aching Pain Intervention(s): Limited activity within patient's tolerance;Monitored during session;Repositioned    Home Living                      Prior Function            PT Goals (current goals can now be found in the care plan section) Acute Rehab PT Goals Patient Stated Goal: looks forward to  wearing high heels and going out PT Goal Formulation: With patient Time For Goal Achievement: 06/04/14 Potential to Achieve Goals: Good Progress towards PT goals: Progressing toward goals    Frequency  7X/week    PT Plan Current plan remains appropriate    Co-evaluation             End of Session   Activity Tolerance: Patient limited by pain Patient left: in bed;with call bell/phone within reach;with family/visitor present     Time: 4854-6270 PT Time Calculation (min) (ACUTE ONLY): 13 min  Charges:  $Therapeutic Exercise: 8-22 mins                    G Codes:      Quin Hoop 05/31/2014, 4:52 PM  Roney Marion, [place] Pager [phone] Office [phone]

## 2014-05-31 NOTE — Progress Notes (Signed)
Physical Therapy Treatment Patient Details Name: Morgan Torres MRN: [id number] DOB: 1950/12/24 Today's Date: 05/31/2014    History of Present Illness Pt is a 63 y.o. female s/p Lt THA 05/28/14.    PT Comments    Slow, but steady progress with functional mobility, with increased activity tolerance, decr dizziness; Still, pt with noted functional dependencies, and will benefit from SNF stay for short-term Rehab to maximize independence and safety with mobility prior to returning home; RN and SW; notified   Follow Up Recommendations  SNF     Equipment Recommendations  None recommended by PT    Recommendations for Other Services       Precautions / Restrictions Precautions Precautions: Posterior Hip;Fall Precaution Comments: Reviewed precautions. Required Braces or Orthoses: Knee Immobilizer - Left Restrictions LLE Weight Bearing: Weight bearing as tolerated    Mobility  Bed Mobility               General bed mobility comments: in recliner upon arrival.  encouraged sitting with B feet on the floor. pt. tolerated less than 3 min. and wanted chair to be reclined.    Transfers Overall transfer level: Needs assistance Equipment used: Rolling walker (2 wheeled) Transfers: Sit to/from Stand Sit to Stand: Min assist Stand pivot transfers: Min assist       General transfer comment: Cues for precautions, hand placement and technique  Ambulation/Gait Ambulation/Gait assistance: Min guard Ambulation Distance (Feet): 35 Feet Assistive device: Rolling walker (2 wheeled) Gait Pattern/deviations: Step-to pattern;Trunk flexed Gait velocity: decreased   General Gait Details: Better activity tolerance, with less need for sitting rest breaks; Encouragement to incr amb distance   Stairs            Wheelchair Mobility    Modified Rankin (Stroke Patients Only)       Balance     Sitting balance-Leahy Scale: Fair       Standing balance-Leahy Scale:  Poor Standing balance comment: relying on rW                    Cognition Arousal/Alertness: Awake/alert Behavior During Therapy: Flat affect Overall Cognitive Status: Within Functional Limits for tasks assessed                      Exercises      General Comments        Pertinent Vitals/Pain Pain Assessment: 0-10 Pain Score: 8  Pain Location: L hip Pain Descriptors / Indicators: Aching;Grimacing Pain Intervention(s): Monitored during session;Premedicated before session;Repositioned    Home Living                      Prior Function            PT Goals (current goals can now be found in the care plan section) Acute Rehab PT Goals Patient Stated Goal: looks forward to wearing high heels and going out PT Goal Formulation: With patient Time For Goal Achievement: 06/04/14 Potential to Achieve Goals: Good Progress towards PT goals: Progressing toward goals    Frequency  7X/week    PT Plan Discharge plan needs to be updated    Co-evaluation             End of Session Equipment Utilized During Treatment: Gait belt Activity Tolerance: Patient tolerated treatment well Patient left: in chair;with call bell/phone within reach;with family/visitor present     Time: 1027-1049 PT Time Calculation (min) (ACUTE ONLY): 22 min  Charges:  $  Gait Training: 8-22 mins                    G Codes:      Roney Marion Hamff 05/31/2014, 12:11 PM  Roney Marion, [place]  Acute Rehabilitation Services Pager [phone] Office [phone]

## 2014-06-01 ENCOUNTER — Encounter (HOSPITAL_COMMUNITY): Payer: Self-pay | Admitting: General Practice

## 2014-06-01 DIAGNOSIS — D62 Acute posthemorrhagic anemia: Secondary | ICD-10-CM | POA: Diagnosis not present

## 2014-06-01 NOTE — Progress Notes (Signed)
Physical Therapy Treatment Patient Details Name: PAIJE GOODHART MRN: [id number] DOB: Aug 28, 1950 Today's Date: 06/01/2014    History of Present Illness Pt is a 63 y.o. female s/p Lt THA 05/28/14.    PT Comments    Continuing progress, though slow; much encouragement to participate, and when she did, was able to increase amb distance; Anticipate continued consistent progress at SNF  Follow Up Recommendations  SNF     Equipment Recommendations  None recommended by PT    Recommendations for Other Services       Precautions / Restrictions Precautions Precautions: Posterior Hip;Fall Precaution Comments: Reviewed precautions. Restrictions Weight Bearing Restrictions: Yes LLE Weight Bearing: Weight bearing as tolerated    Mobility  Bed Mobility                  Transfers Overall transfer level: Needs assistance Equipment used: Rolling walker (2 wheeled) Transfers: Sit to/from Stand Sit to Stand: Min assist         General transfer comment: Cues for precautions, hand placement and technique  Ambulation/Gait Ambulation/Gait assistance: Min guard Ambulation Distance (Feet): 40 Feet Assistive device: Rolling walker (2 wheeled) Gait Pattern/deviations: Step-to pattern (with emerging step-through) Gait velocity: decreased   General Gait Details: Better activity tolerance, with less need for sitting rest breaks; Encouragement to incr amb distance; adjusted RW for better fit   Stairs            Wheelchair Mobility    Modified Rankin (Stroke Patients Only)       Balance     Sitting balance-Leahy Scale: Fair       Standing balance-Leahy Scale: Poor                      Cognition Arousal/Alertness: Awake/alert Behavior During Therapy: Flat affect Overall Cognitive Status: Within Functional Limits for tasks assessed                      Exercises Total Joint Exercises Quad Sets: AROM;Left;10 reps Gluteal Sets: AROM;Both;10  reps    General Comments        Pertinent Vitals/Pain Pain Assessment: Faces Faces Pain Scale: Hurts even more Pain Location: L hip with movement Pain Descriptors / Indicators: Aching Pain Intervention(s): Monitored during session;Repositioned    Home Living                      Prior Function            PT Goals (current goals can now be found in the care plan section) Acute Rehab PT Goals Patient Stated Goal: looks forward to wearing high heels and going out PT Goal Formulation: With patient Time For Goal Achievement: 06/04/14 Potential to Achieve Goals: Good Progress towards PT goals: Progressing toward goals (slowly)    Frequency  7X/week    PT Plan Current plan remains appropriate    Co-evaluation             End of Session Equipment Utilized During Treatment: Gait belt Activity Tolerance: Patient limited by pain Patient left: in chair;with call bell/phone within reach;with family/visitor present     Time: 6720-9470 PT Time Calculation (min) (ACUTE ONLY): 21 min  Charges:  $Gait Training: 8-22 mins                    G Codes:      Quin Hoop 06/01/2014, 11:17 AM  Roney Marion, PT  Acute Rehabilitation  Services Pager [phone] Office [phone]

## 2014-06-01 NOTE — Plan of Care (Signed)
Problem: Phase I Progression Outcomes Goal: CMS/Neurovascular status WDL Outcome: Completed/Met Date Met:  06/01/14 Goal: Dangle or out of bed evening of surgery Outcome: Completed/Met Date Met:  06/01/14 Goal: Other Phase I Outcomes/Goals Outcome: Not Applicable Date Met:  71/16/57  Problem: Phase II Progression Outcomes Goal: Ambulates Outcome: Completed/Met Date Met:  06/01/14 Goal: Tolerating diet Outcome: Completed/Met Date Met:  06/01/14 Goal: Discharge plan established Outcome: Completed/Met Date Met:  06/01/14 Goal: Other Phase II Outcomes/Goals Outcome: Completed/Met Date Met:  06/01/14  Problem: Phase III Progression Outcomes Goal: Pain controlled on oral analgesia Outcome: Completed/Met Date Met:  06/01/14 Goal: Ambulates Outcome: Completed/Met Date Met:  06/01/14 Goal: Incision clean - minimal/no drainage Outcome: Completed/Met Date Met:  06/01/14 Goal: Discharge plan remains appropriate-arrangements made Outcome: Completed/Met Date Met:  06/01/14 Goal: Anticoagulant follow-up in place Outcome: Completed/Met Date Met:  06/01/14 Goal: Other Phase III Outcomes/Goals Outcome: Not Applicable Date Met:  90/38/33  Problem: Discharge Progression Outcomes Goal: Barriers To Progression Addressed/Resolved Outcome: Completed/Met Date Met:  06/01/14 Goal: CMS/Neurovascular status at or above baseline Outcome: Completed/Met Date Met:  06/01/14 Goal: Anticoagulant follow-up in place Outcome: Completed/Met Date Met:  06/01/14 Goal: Pain controlled with appropriate interventions Outcome: Completed/Met Date Met:  06/01/14 Goal: Hemodynamically stable Outcome: Completed/Met Date Met:  38/32/91 Goal: Complications resolved/controlled Outcome: Completed/Met Date Met:  06/01/14 Goal: Tolerates diet Outcome: Completed/Met Date Met:  06/01/14 Goal: Activity appropriate for discharge plan Outcome: Completed/Met Date Met:  06/01/14 Goal: Ambulates safely using assistive  device Outcome: Completed/Met Date Met:  06/01/14 Goal: Follows weight - bearing limitations Outcome: Completed/Met Date Met:  06/01/14 Goal: Discharge plan in place and appropriate Outcome: Completed/Met Date Met:  06/01/14 Goal: Negotiates stairs Outcome: Completed/Met Date Met:  06/01/14 Goal: Demonstrates ADLs as appropriate Outcome: Not Applicable Date Met:  91/66/06 Goal: Incision without S/S infection Outcome: Completed/Met Date Met:  06/01/14 Goal: Other Discharge Outcomes/Goals Outcome: Not Applicable Date Met:  00/45/99

## 2014-06-01 NOTE — Discharge Summary (Signed)
PATIENT ID: Morgan Torres        MRN:  [id number]          DOB/AGE: Feb 07, 1951 / 63 y.o.    DISCHARGE SUMMARY  ADMISSION DATE:    05/28/2014 DISCHARGE DATE:   06/01/2014   ADMISSION DIAGNOSIS: OA LEFT HIP    DISCHARGE DIAGNOSIS:  OA LEFT HIP    ADDITIONAL DIAGNOSIS: Active Problems:   Osteoarthritis of left hip   Acute blood loss anemia  Past Medical History  Diagnosis Date  . Lumbar herniated disc   . Sciatica   . Hypertension   . Small bowel obstruction   . COPD (chronic obstructive pulmonary disease)   . Depression   . Anxiety   . Bronchitis     Hx: of  . Pneumonia     Hx: of  . Fibromyalgia   . Myocardial infarction 2014    followed by Dr. Debara Pickett, treated medically, no stents  . Asthma     seasonal allergies   . Arthritis     hip, lumbar spine   . Coronary artery disease     PROCEDURE: Procedure(s): LEFT TOTAL HIP ARTHROPLASTY Left on 05/28/2014  CONSULTS: PT/OT/SW      HISTORY:  See H&P in chart  HOSPITAL COURSE:  Morgan Torres is a 63 y.o. admitted on 05/28/2014 and found to have a diagnosis of OA LEFT HIP.  After appropriate laboratory studies were obtained  they were taken to the operating room on 05/28/2014 and underwent  Procedure(s): LEFT TOTAL HIP ARTHROPLASTY  Left.   They were given perioperative antibiotics:  Anti-infectives    Start     Dose/Rate Route Frequency Ordered Stop   05/28/14 1330  ceFAZolin (ANCEF) IVPB 2 g/50 mL premix     2 g100 mL/hr over 30 Minutes Intravenous Every 6 hours 05/28/14 1237 05/28/14 2032   05/28/14 0600  ceFAZolin (ANCEF) IVPB 2 g/50 mL premix     2 g100 mL/hr over 30 Minutes Intravenous On call to O.R. 05/27/14 1417 05/28/14 0745    .  Tolerated the procedure well.  Placed with a foley intraoperatively.    POD #1, allowed out of bed to a chair.  PT for ambulation and exercise program.  Foley D/C'd in morning.  IV saline locked.  O2 discontionued.  POD #2, continued PT and ambulation.   Patient with acute  blood loss anemia post operatively which was monitored. Patient slow to progress with PT difficulty with mobility initial plan for HHPT changed to SNF for d/c. The remainder of the hospital course was dedicated to ambulation and strengthening.   The patient was discharged on 4 Days Post-Op in  Stable condition.  Blood products given:none  DIAGNOSTIC STUDIES: Recent vital signs: Patient Vitals for the past 24 hrs:  BP Temp Temp src Pulse Resp SpO2  06/01/14 1147 (!) 123/54 mmHg 98.3 F (36.8 C) - 79 18 98 %  06/01/14 0532 (!) 143/51 mmHg 99 F (37.2 C) - 75 18 98 %  06/01/14 0400 - - - - 16 100 %  06/01/14 0000 - - - - 16 98 %  05/31/14 2238 (!) 137/48 mmHg - - 91 - -  05/31/14 2018 (!) 140/52 mmHg 98.9 F (37.2 C) - 89 18 100 %  05/31/14 2000 - - - - 18 98 %  05/31/14 1711 - 99.5 F (37.5 C) Oral - - -  05/31/14 1434 (!) 127/36 mmHg 98.9 F (37.2 C) Oral 79 18 97 %  Recent laboratory studies:  Recent Labs  05/28/14 1345 05/29/14 0730 05/30/14 0655 05/31/14 0705  WBC 15.5* 7.7 9.2 7.4  HGB 12.8 10.0* 9.8* 8.7*  HCT 36.7 29.6* 30.3* 26.5*  PLT 223 206 185 177    Recent Labs  05/28/14 1345 05/29/14 0730  NA  --  136*  K  --  4.4  CL  --  98  CO2  --  23  BUN  --  20  CREATININE 0.73 0.76  GLUCOSE  --  121*  CALCIUM  --  8.5   Lab Results  Component Value Date   INR 1.06 05/18/2014   INR 1.04 03/29/2013     Recent Radiographic Studies :  Dg Chest 2 View  05/18/2014   CLINICAL DATA:  Preoperative evaluation for hip replacement  EXAM: CHEST  2 VIEW  COMPARISON:  03/29/2013  FINDINGS: Cardiac shadow is within normal limits. The lungs are well aerated bilaterally. The thoracic spine shows degenerative changes stable from the prior exam.  IMPRESSION: No acute abnormality noted.   Electronically Signed   By: Inez Catalina M.D.   On: 05/18/2014 16:02   Dg Pelvis Portable  05/28/2014   CLINICAL DATA:  Postop left hip, left hip osteoarthritis  EXAM: PORTABLE  PELVIS 1-2 VIEWS  COMPARISON:  None.  FINDINGS: Two portable views of the pelvis submitted. There is left hip prosthesis anatomic alignment.  IMPRESSION: Left hip prosthesis in anatomic alignment.   Electronically Signed   By: Lahoma Crocker M.D.   On: 05/28/2014 11:53   Dg Hip Portable 1 View Left  05/28/2014   CLINICAL DATA:  Postop left hip  EXAM: PORTABLE LEFT HIP - 1 VIEW  COMPARISON:  Portable pelvis same day  FINDINGS: Single cross-table lateral view of the left hip submitted. There is left hip prosthesis in anatomic alignment. Postsurgical changes are noted with lateral skin staples.  IMPRESSION: Left hip prosthesis in anatomic alignment.   Electronically Signed   By: Lahoma Crocker M.D.   On: 05/28/2014 12:05    DISCHARGE INSTRUCTIONS:   DISCHARGE MEDICATIONS:     Medication List    STOP taking these medications        acetaminophen 325 MG tablet  Commonly known as:  TYLENOL     aspirin 81 MG EC tablet     ibuprofen 800 MG tablet  Commonly known as:  ADVIL,MOTRIN     meloxicam 15 MG tablet  Commonly known as:  MOBIC      TAKE these medications        albuterol 108 (90 BASE) MCG/ACT inhaler  Commonly known as:  PROVENTIL HFA;VENTOLIN HFA  Inhale 1-2 puffs into the lungs every 6 (six) hours as needed for wheezing or shortness of breath.     amLODipine 5 MG tablet  Commonly known as:  NORVASC  Take 1 tablet (5 mg total) by mouth daily.     atorvastatin 40 MG tablet  Commonly known as:  LIPITOR  TAKE 1 TABLET BY MOUTH DAILY AT 6 PM     benzonatate 100 MG capsule  Commonly known as:  TESSALON  Take 100 mg by mouth 3 (three) times daily as needed for cough.     enoxaparin 30 MG/0.3ML injection  Commonly known as:  LOVENOX  Inject 0.3 mLs (30 mg total) into the skin every 12 (twelve) hours.     EPINEPHrine 0.3 mg/0.3 mL Soaj injection  Commonly known as:  EPIPEN 2-PAK  Inject 0.3 mLs (0.3 mg total)  into the muscle once as needed (for severe allergic reaction). CAll 911  immediately if you have to use this medicine     gabapentin 100 MG capsule  Commonly known as:  NEURONTIN  Take 100 mg by mouth at bedtime.     guaiFENesin 100 MG/5ML liquid  Commonly known as:  ROBITUSSIN  Take 100 mg by mouth 3 (three) times daily as needed for cough or congestion.     LORazepam 1 MG tablet  Commonly known as:  ATIVAN  Take 1 mg by mouth daily as needed for anxiety.     losartan-hydrochlorothiazide 100-12.5 MG per tablet  Commonly known as:  HYZAAR  Take 1 tablet by mouth daily.     metoprolol tartrate 25 MG tablet  Commonly known as:  LOPRESSOR  Take 0.5 tablets (12.5 mg total) by mouth 2 (two) times daily.     nitroGLYCERIN 0.4 MG SL tablet  Commonly known as:  NITROSTAT  Place 1 tablet (0.4 mg total) under the tongue every 5 (five) minutes x 3 doses as needed for chest pain.     OVER THE COUNTER MEDICATION  Take 1 tablet by mouth daily. Herbal Life - Dietary Supplement     oxyCODONE-acetaminophen 5-325 MG per tablet  Commonly known as:  ROXICET  Take 1-2 tablets by mouth every 4 (four) hours as needed for severe pain.        FOLLOW UP VISIT:       Follow-up Information    Follow up with CAFFREY JR,[name], MD. Schedule an appointment as soon as possible for a visit in 2 weeks.   Specialty:  Orthopedic Surgery   Contact information:   [place] [address] [phone]       Follow up with [facility].   Why:  [facility] will contact you about the start date and time of Home Health Physical Therapy    Contact information:   [address] [place] [address] [phone]       DISPOSITION:   Skilled Nursing Facility/Rehab  CONDITION:  Stable   Chriss Czar, PA-C  06/01/2014 1:20 PM

## 2014-06-01 NOTE — Clinical Documentation Improvement (Addendum)
06/01/14   Patient admitted for Left Hip Arthroplasty 05/28/14.  Hgb down 5.4 gms/dL from preop.  EBL per Anesthesia Record is 350 ml.  Physical/Occupational Therapy notes report that patient's activity is "limited by dizziness" and "continued poor tolerance and endurance with mobility".  Possible Clinical Conditions:   - Combination of Acute Blood Loss and Dilutional Anemia   - Acute Blood Loss Anemia   - Other Condition   - Unable to Clinically Determine  Thank You, Erling Conte ,RN Clinical Documentation Specialist:  [phone] [place] [facility]

## 2014-06-01 NOTE — Progress Notes (Signed)
Physical Therapy Treatment Patient Details Name: Morgan Torres MRN: [id number] DOB: [birth year] Today's Date: 06/01/2014    History of Present Illness Pt is a 63 y.o. female s/p Lt THA 05/28/14.    PT Comments    Session focused on therex; pt more engaged, and had many questions, not only about therex, but also about dc planning; Took time to educate re: proper form for therex and use of sheet roll for pt to be independent in AAROM  Follow Up Recommendations  SNF     Equipment Recommendations  None recommended by PT    Recommendations for Other Services       Precautions / Restrictions Precautions Precautions: Posterior Hip;Fall Precaution Comments: Reviewed precautions. Restrictions LLE Weight Bearing: Weight bearing as tolerated    Mobility  Bed Mobility                  Transfers                    Ambulation/Gait                 Stairs            Wheelchair Mobility    Modified Rankin (Stroke Patients Only)       Balance                                    Cognition Arousal/Alertness: Awake/alert Behavior During Therapy: Flat affect Overall Cognitive Status: Within Functional Limits for tasks assessed                      Exercises Total Joint Exercises Ankle Circles/Pumps: AROM;Both;10 reps Quad Sets: AROM;Left;10 reps Gluteal Sets: AROM;Both;10 reps Towel Squeeze: AROM;Left;10 reps Heel Slides: AAROM;Strengthening;Left;10 reps Hip ABduction/ADduction: AAROM;Strengthening;Left;10 reps    General Comments        Pertinent Vitals/Pain      Home Living                      Prior Function            PT Goals (current goals can now be found in the care plan section) Acute Rehab PT Goals Patient Stated Goal: looks forward to wearing high heels and going out PT Goal Formulation: With patient Time For Goal Achievement: 06/04/14 Potential to Achieve Goals: Good Progress towards  PT goals: Progressing toward goals (better range noted with therex)    Frequency  7X/week    PT Plan Current plan remains appropriate    Co-evaluation             End of Session   Activity Tolerance: Patient limited by pain Patient left: in bed;with call bell/phone within reach;with family/visitor present     Time: 1027-2536 PT Time Calculation (min) (ACUTE ONLY): 27 min  Charges:  $Therapeutic Exercise: 23-37 mins                    G Codes:      Quin Hoop 06/01/2014, 4:17 PM  Roney Marion, [place] Pager [phone] Office [phone]

## 2014-06-01 NOTE — Clinical Social Work Note (Addendum)
CSW reviewed bed offers with patient.  Patient would like to receive STR at [facility].  Liaison contacted and met with patient at bedside.  Patient has [facility] and an authorization will need to be received.  SNF is currently attempting to gain authorization.  CSW attempted to present bed offers x2.  First attempt patient was working with physical therapy. Second attempt, patient was in the restroom.  Patient significant other at bedside.  CSW will re-attempt at later time.  Nonnie Done, [place] [phone]  Psychiatric & Orthopedics (5N 1-16) Clinical Social Worker

## 2014-06-01 NOTE — Discharge Instructions (Signed)
Diet: As you were doing prior to hospitalization   Activity: Increase activity slowly as tolerated  No lifting or driving for 6 weeks  Weight bear as tolerated left leg with posterior hip dislocation precautions as taught by PT  Shower: May shower without a dressing on post op day #5, NO SOAKING in tub   Dressing: You may change your dressing on post op day #3.  Then change the dressing daily with sterile 4"x4"s gauze dressing  And TED hose for knees. Use paper tape to hold dressing in place  For hips. You may clean the incision with alcohol prior to redressing.   Weight Bearing: weight bearing as taught in physical therapy. Use a walker or  Crutches as instructed.   To prevent constipation: you may use a stool softener such as -  Colace ( over the counter) 100 mg by mouth twice a day  Drink plenty of fluids ( prune juice may be helpful) and high fiber foods  Miralax ( over the counter) for constipation as needed.   Precautions: If you experience chest pain or shortness of breath - call 911 immediately For transfer to the hospital emergency department!!  If you develop a fever greater that 101 F, purulent drainage from wound, increased redness or drainage from wound, or calf pain -- Call the office   Follow- Up Appointment: Please call for an appointment to be seen in 2 weeks  Louisa - [phone]  Medications: Given lovenox to take 2 weeks postoperatively stop date 06/11/14

## 2014-06-01 NOTE — Progress Notes (Signed)
Received call from RN, Ginger, re: pt's d/c this pm to SNF.  Unit CSW note from today reviewed.  Pt waiting on [employer] authorization for SNF.  Per RN request, CSW called [place], [place] on-call for [place] and informed that pt would not be going to SNF this pm and that there was a d/c order written earlier today.  Lanny Hurst to inform Dr. French Ana of above.  Unit CSW to f/u in am re: pt's d/c/insurance authorization.

## 2014-06-01 NOTE — Progress Notes (Signed)
Subjective: 4 Days Post-Op Procedure(s) (LRB): LEFT TOTAL HIP ARTHROPLASTY (Left) Patient reports pain as moderate.    Objective: Vital signs in last 24 hours: Temp:  [98.3 F (36.8 C)-99.5 F (37.5 C)] 98.3 F (36.8 C) (12/08 1147) Pulse Rate:  [75-91] 79 (12/08 1147) Resp:  [16-18] 18 (12/08 1147) BP: (123-143)/(36-54) 123/54 mmHg (12/08 1147) SpO2:  [97 %-100 %] 98 % (12/08 1147)  Intake/Output from previous day: 12/07 0701 - 12/08 0700 In: 210 [P.O.:210] Out: -  Intake/Output this shift: Total I/O In: 240 [P.O.:240] Out: -    Recent Labs  05/30/14 0655 05/31/14 0705  HGB 9.8* 8.7*    Recent Labs  05/30/14 0655 05/31/14 0705  WBC 9.2 7.4  RBC 3.19* 2.82*  HCT 30.3* 26.5*  PLT 185 177   No results for input(s): NA, K, CL, CO2, BUN, CREATININE, GLUCOSE, CALCIUM in the last 72 hours. No results for input(s): LABPT, INR in the last 72 hours.  Neurovascular intact Sensation intact distally Intact pulses distally Dorsiflexion/Plantar flexion intact Incision: dressing C/D/I  Assessment/Plan: 4 Days Post-Op Procedure(s) (LRB): LEFT TOTAL HIP ARTHROPLASTY (Left)  Acute blood loss anemia  Up with therapy Discharge to SNF  Morgan Torres 06/01/2014, 1:15 PM

## 2014-06-02 ENCOUNTER — Observation Stay (HOSPITAL_COMMUNITY)
Admission: EM | Admit: 2014-06-02 | Discharge: 2014-06-03 | Disposition: A | Discharge status: Home / Self Care | Payer: Medicare HMO | Attending: Family Medicine | Admitting: Family Medicine

## 2014-06-02 DIAGNOSIS — F329 Major depressive disorder, single episode, unspecified: Secondary | ICD-10-CM | POA: Diagnosis not present

## 2014-06-02 DIAGNOSIS — I252 Old myocardial infarction: Secondary | ICD-10-CM | POA: Insufficient documentation

## 2014-06-02 DIAGNOSIS — M5430 Sciatica, unspecified side: Secondary | ICD-10-CM | POA: Insufficient documentation

## 2014-06-02 DIAGNOSIS — G8929 Other chronic pain: Secondary | ICD-10-CM | POA: Diagnosis not present

## 2014-06-02 DIAGNOSIS — J45909 Unspecified asthma, uncomplicated: Secondary | ICD-10-CM | POA: Insufficient documentation

## 2014-06-02 DIAGNOSIS — M1612 Unilateral primary osteoarthritis, left hip: Principal | ICD-10-CM | POA: Insufficient documentation

## 2014-06-02 DIAGNOSIS — M25552 Pain in left hip: Secondary | ICD-10-CM | POA: Diagnosis present

## 2014-06-02 DIAGNOSIS — I10 Essential (primary) hypertension: Secondary | ICD-10-CM | POA: Insufficient documentation

## 2014-06-02 DIAGNOSIS — F17210 Nicotine dependence, cigarettes, uncomplicated: Secondary | ICD-10-CM | POA: Diagnosis not present

## 2014-06-02 DIAGNOSIS — M549 Dorsalgia, unspecified: Secondary | ICD-10-CM | POA: Insufficient documentation

## 2014-06-02 DIAGNOSIS — M25559 Pain in unspecified hip: Secondary | ICD-10-CM | POA: Diagnosis present

## 2014-06-02 DIAGNOSIS — Z79899 Other long term (current) drug therapy: Secondary | ICD-10-CM | POA: Diagnosis not present

## 2014-06-02 DIAGNOSIS — J449 Chronic obstructive pulmonary disease, unspecified: Secondary | ICD-10-CM | POA: Diagnosis not present

## 2014-06-02 DIAGNOSIS — I2510 Atherosclerotic heart disease of native coronary artery without angina pectoris: Secondary | ICD-10-CM | POA: Insufficient documentation

## 2014-06-02 DIAGNOSIS — M797 Fibromyalgia: Secondary | ICD-10-CM | POA: Diagnosis not present

## 2014-06-02 DIAGNOSIS — Z885 Allergy status to narcotic agent status: Secondary | ICD-10-CM | POA: Diagnosis not present

## 2014-06-02 DIAGNOSIS — F419 Anxiety disorder, unspecified: Secondary | ICD-10-CM | POA: Diagnosis not present

## 2014-06-02 NOTE — Progress Notes (Signed)
Physical Therapy Treatment Patient Details Name: Morgan Torres MRN: [id number] DOB: December 03, 1950 Today's Date: 06/02/2014    History of Present Illness Pt is a 63 y.o. female s/p Lt THA 05/28/14.    PT Comments    Continuing progress with functional mobility, with longer distance walked, including making turns to and from bathroom; Noting emerging step-through pattern; pt is still quite slow with gait and bed mobility;   With consistent therapies at SNF, anticipate continuing progress  Follow Up Recommendations  SNF     Equipment Recommendations  None recommended by PT    Recommendations for Other Services OT consult     Precautions / Restrictions Precautions Precautions: Posterior Hip;Fall Precaution Comments: Reviewed precautions. Restrictions LLE Weight Bearing: Weight bearing as tolerated    Mobility  Bed Mobility Overal bed mobility: Needs Assistance Bed Mobility: Supine to Sit;Sit to Supine     Supine to sit: Min guard Sit to supine: Min assist   General bed mobility comments: Cues for technique and min assist for LLE onto bed  Transfers Overall transfer level: Needs assistance Equipment used: Rolling walker (2 wheeled) Transfers: Sit to/from Stand Sit to Stand: Min guard         General transfer comment: Cues for precautions, hand placement and technique  Ambulation/Gait Ambulation/Gait assistance: Min guard Ambulation Distance (Feet): 55 Feet (including a few turns heading to bathroom) Assistive device: Rolling walker (2 wheeled) Gait Pattern/deviations: Step-to pattern (emerging step-through) Gait velocity: decreased   General Gait Details: one sitting rest break; Better gait efficiency, but still moving slowly   Stairs            Wheelchair Mobility    Modified Rankin (Stroke Patients Only)       Balance                                    Cognition Arousal/Alertness: Awake/alert Behavior During Therapy: Flat  affect Overall Cognitive Status: Within Functional Limits for tasks assessed                      Exercises Total Joint Exercises Ankle Circles/Pumps: AROM;Both;10 reps Quad Sets: AROM;Left;10 reps Gluteal Sets: AROM;Both;10 reps Towel Squeeze: AROM;Left;10 reps Heel Slides: AAROM;Strengthening;Left;10 reps Hip ABduction/ADduction: AAROM;Strengthening;Left;10 reps    General Comments        Pertinent Vitals/Pain Pain Assessment: 0-10 Pain Score: 8  Pain Location: L hip end of session Pain Descriptors / Indicators: Aching Pain Intervention(s): Monitored during session;Repositioned    Home Living                      Prior Function            PT Goals (current goals can now be found in the care plan section) Acute Rehab PT Goals Patient Stated Goal: looks forward to wearing high heels and going out PT Goal Formulation: With patient Time For Goal Achievement: 06/04/14 Potential to Achieve Goals: Good Progress towards PT goals: Progressing toward goals    Frequency  7X/week    PT Plan Current plan remains appropriate    Co-evaluation             End of Session Equipment Utilized During Treatment: Gait belt Activity Tolerance: Patient tolerated treatment well Patient left: in chair;with call bell/phone within reach;with family/visitor present     Time: 1009-1055 (minus approx 4-5 mins on commode) PT Time Calculation (  min) (ACUTE ONLY): 46 min  Charges:  $Gait Training: 8-22 mins $Therapeutic Exercise: 8-22 mins $Therapeutic Activity: 8-22 mins                    G Codes:      Roney Marion [facility] 06/02/2014, 11:39 AM  Roney Marion, [place] Pager [phone] Office [phone]

## 2014-06-02 NOTE — ED Provider Notes (Signed)
CSN: [id number]     Arrival date & time 06/02/14  2353 History  This chart was scribed for Everlene Balls, MD by Marlowe Kays, ED Scribe. This patient was seen in room D32C/D32C and the patient's care was started at 12:08 AM.  Chief Complaint  Patient presents with  . Hip Pain   The history is provided by the patient. No language interpreter was used.    HPI Comments:  Morgan Torres is a 63 y.o. obese female who presents to the Emergency Department complaining of severe left hip pain secondary to having left hip replacement surgery six days ago by Dr. French Ana. She reports associated left sided abdominal pain that she believes is related to the hip pain. She also reports associated fever (unsure of Tmax) and constipation stating she has not had a bowel movement since her surgery. Pt went to [facility] and is unhappy with the care she has been receiving stating she was offered OTC Tylenol for her pain. She reports her pain was a 10/10 until receiving Fentanyl 150 mcg from EMS en route to the hospital and now reports it at 7/10. Denies modifying factors of the pain. Denies diarrhea, appetite change, nausea or vomiting. PMH of chronic back pain, HTN, COPD, anxiety and depression, CAD and fibromyalgia.   Past Medical History  Diagnosis Date  . Lumbar herniated disc   . Sciatica   . Hypertension   . Small bowel obstruction   . COPD (chronic obstructive pulmonary disease)   . Depression   . Anxiety   . Bronchitis     Hx: of  . Pneumonia     Hx: of  . Fibromyalgia   . Myocardial infarction 2014    followed by Dr. Debara Pickett, treated medically, no stents  . Asthma     seasonal allergies   . Arthritis     hip, lumbar spine   . Coronary artery disease    Past Surgical History  Procedure Laterality Date  . Abdominal surgery      resection of small intestine  . Abdominal hysterectomy    . Tonsillectomy    . Dilation and curettage of uterus    . Colonoscopy      Hx; of  . Colon  surgery      resection for bowel - for obstruction   . Back surgery      fusion  . Cardiac catheterization  2014  . Hernia repair  1970's    inguinal hernia   . Salivary gland surgery Left 1970-1980    approached from inside mouth & side of neck  . Total hip arthroplasty Left 05/28/2014    Procedure: LEFT TOTAL HIP ARTHROPLASTY;  Surgeon: Yvette Rack., MD;  Location: [place];  Service: Orthopedics;  Laterality: Left;  . Total hip arthroplasty Left 05/28/2014    dr caffrey   Family History  Problem Relation Age of Onset  . Hypertension Mother   . Diabetes Mother   . Cancer - Other Mother   . Cancer Sister    History  Substance Use Topics  . Smoking status: Current Every Day Smoker -- 0.40 packs/day for 25 years    Types: Cigarettes  . Smokeless tobacco: Never Used     Comment: Currently on the Nicoderm patch."smokes a few"  . Alcohol Use: Yes     Comment: 1 glass of wine occas. (1 every 2 weeks or so)   OB History    No data available  Review of Systems  Constitutional: Positive for fever. Negative for appetite change.  Gastrointestinal: Positive for abdominal pain and constipation. Negative for nausea, vomiting and diarrhea.  Musculoskeletal: Positive for arthralgias.  Skin: Positive for wound.  All other systems reviewed and are negative.   Allergies  Diclofenac; Codeine; and Hydrocodone  Home Medications   Prior to Admission medications   Medication Sig Start Date End Date Taking? Authorizing Provider  albuterol (PROVENTIL HFA;VENTOLIN HFA) 108 (90 BASE) MCG/ACT inhaler Inhale 1-2 puffs into the lungs every 6 (six) hours as needed for wheezing or shortness of breath. 05/11/12   Harden Mo, MD  amLODipine (NORVASC) 5 MG tablet Take 1 tablet (5 mg total) by mouth daily. 05/29/14   Chriss Czar, PA-C  atorvastatin (LIPITOR) 40 MG tablet TAKE 1 TABLET BY MOUTH DAILY AT 6 PM 05/19/14   Pixie Casino, MD  benzonatate (TESSALON) 100 MG capsule Take 100 mg by  mouth 3 (three) times daily as needed for cough.    Historical Provider, MD  enoxaparin (LOVENOX) 30 MG/0.3ML injection Inject 0.3 mLs (30 mg total) into the skin every 12 (twelve) hours. 05/29/14   Chriss Czar, PA-C  EPINEPHrine (EPIPEN 2-PAK) 0.3 mg/0.3 mL DEVI Inject 0.3 mLs (0.3 mg total) into the muscle once as needed (for severe allergic reaction). CAll 911 immediately if you have to use this medicine 11/26/12   Anderson Malta L Piepenbrink, PA-C  gabapentin (NEURONTIN) 100 MG capsule Take 100 mg by mouth at bedtime.    Historical Provider, MD  guaiFENesin (ROBITUSSIN) 100 MG/5ML liquid Take 100 mg by mouth 3 (three) times daily as needed for cough or congestion.    Historical Provider, MD  LORazepam (ATIVAN) 1 MG tablet Take 1 mg by mouth daily as needed for anxiety.    Historical Provider, MD  losartan-hydrochlorothiazide (HYZAAR) 100-12.5 MG per tablet Take 1 tablet by mouth daily.    Historical Provider, MD  metoprolol tartrate (LOPRESSOR) 25 MG tablet Take 0.5 tablets (12.5 mg total) by mouth 2 (two) times daily. 04/13/14   Pixie Casino, MD  nitroGLYCERIN (NITROSTAT) 0.4 MG SL tablet Place 1 tablet (0.4 mg total) under the tongue every 5 (five) minutes x 3 doses as needed for chest pain. 04/01/13   Erlene Quan, PA-C  OVER THE COUNTER MEDICATION Take 1 tablet by mouth daily. Herbal Life - Dietary Supplement    Historical Provider, MD  oxyCODONE-acetaminophen (ROXICET) 5-325 MG per tablet Take 1-2 tablets by mouth every 4 (four) hours as needed for severe pain. 05/29/14   Chriss Czar, PA-C   Triage Vitals: BP 116/48 mmHg  Pulse 76  Temp(Src) 99.7 F (37.6 C) (Oral)  Resp 15  SpO2 99% Physical Exam  Constitutional: She is oriented to person, place, and time. She appears well-developed and well-nourished. No distress.  HENT:  Head: Normocephalic and atraumatic.  Eyes: Conjunctivae and EOM are normal. Pupils are equal, round, and reactive to light. No scleral icterus.  Neck: Normal  range of motion. Neck supple. No JVD present. No tracheal deviation present. No thyromegaly present.  Cardiovascular: Normal rate, regular rhythm and normal heart sounds.  Exam reveals no gallop and no friction rub.   No murmur heard. Good dorsalis pedal pulses bilaterally.  Pulmonary/Chest: Effort normal and breath sounds normal. No respiratory distress. She has no wheezes. She exhibits no tenderness.  Abdominal: Soft. Bowel sounds are normal. She exhibits no distension and no mass. There is no tenderness. There is no rebound and no guarding.  Musculoskeletal:  Normal range of motion. She exhibits edema. She exhibits no tenderness.  1 + edema noted to left lower extremity.  Lymphadenopathy:    She has no cervical adenopathy.  Neurological: She is alert and oriented to person, place, and time.  Skin: Skin is warm and dry. No rash noted. No erythema. No pallor.  10 cm wound site to left hip. Staples in place. No warmth, erythema, purulent drainage.  Psychiatric: She has a normal mood and affect. Her behavior is normal.  Nursing note and vitals reviewed.   ED Course  Procedures (including critical care time) DIAGNOSTIC STUDIES: Oxygen Saturation is 99% on RA, normal by my interpretation.   COORDINATION OF CARE: 12:13 AM- Will order pain medication for pain control and speak with social worker about placing into another rehab facility. Pt verbalizes understanding and agrees to plan.  Medications  oxyCODONE-acetaminophen (PERCOCET/ROXICET) 5-325 MG per tablet 2 tablet (2 tablets Oral Given 06/03/14 0056)    Labs Review Labs Reviewed  CBC WITH DIFFERENTIAL - Abnormal; Notable for the following:    RBC 2.76 (*)    Hemoglobin 8.5 (*)    HCT 25.7 (*)    Monocytes Relative 15 (*)    Monocytes Absolute 1.4 (*)    All other components within normal limits  COMPREHENSIVE METABOLIC PANEL - Abnormal; Notable for the following:    Sodium 133 (*)    Chloride 93 (*)    Glucose, Bld 110 (*)     Total Protein 5.8 (*)    Albumin 2.2 (*)    All other components within normal limits  LIPASE, BLOOD    Imaging Review No results found.   EKG Interpretation None      MDM   Final diagnoses:  None    Patient presents emergency department out of concern for worsening pain at nursing facility. Will obtain basic labs and treat her pain however she cannot go home at this time refuses to go back to her nursing facility. She will require admission for rehabilitation disposition.  I personally performed the services described in this documentation, which was scribed in my presence. The recorded information has been reviewed and is accurate.    Everlene Balls, MD 06/03/14 [phone]

## 2014-06-02 NOTE — ED Notes (Signed)
Pt to ED via [facility] from [facility]. Pt had L sided hip surgery on 12/4 and rehab facility only giving patient Tylenol for pain. Pt to ED for pain control and further placement for rehab. Pt given 184mcg Fentanyl enroute by [facility]; VS bp 124/64; HR 79; Resp 18

## 2014-06-02 NOTE — Clinical Social Work Note (Signed)
Patient will discharge to [facility] today (per MD order) RN to call report to: [phone] Transportation: [facility]- scheduled  CSW updated RN and patient.  Both agreeable to disposition/discharge plans.  Patient will share discharge plans with daughter.    Nonnie Done, [place] [phone]  Psychiatric & Orthopedics (5N 1-16) Clinical Social Worker

## 2014-06-03 ENCOUNTER — Encounter (HOSPITAL_COMMUNITY): Payer: Self-pay | Admitting: *Deleted

## 2014-06-03 DIAGNOSIS — M25552 Pain in left hip: Secondary | ICD-10-CM

## 2014-06-03 DIAGNOSIS — M25559 Pain in unspecified hip: Secondary | ICD-10-CM | POA: Diagnosis present

## 2014-06-03 LAB — CBC WITH DIFFERENTIAL/PLATELET
Basophils Absolute: 0 10*3/uL (ref 0.0–0.1)
Basophils Relative: 0 % (ref 0–1)
Eosinophils Absolute: 0.2 10*3/uL (ref 0.0–0.7)
Eosinophils Relative: 2 % (ref 0–5)
HCT: 25.7 % — ABNORMAL LOW (ref 36.0–46.0)
HEMOGLOBIN: 8.5 g/dL — AB (ref 12.0–15.0)
LYMPHS ABS: 2.3 10*3/uL (ref 0.7–4.0)
LYMPHS PCT: 23 % (ref 12–46)
MCH: 30.8 pg (ref 26.0–34.0)
MCHC: 33.1 g/dL (ref 30.0–36.0)
MCV: 93.1 fL (ref 78.0–100.0)
Monocytes Absolute: 1.4 10*3/uL — ABNORMAL HIGH (ref 0.1–1.0)
Monocytes Relative: 15 % — ABNORMAL HIGH (ref 3–12)
NEUTROS ABS: 5.8 10*3/uL (ref 1.7–7.7)
NEUTROS PCT: 60 % (ref 43–77)
Platelets: 280 10*3/uL (ref 150–400)
RBC: 2.76 MIL/uL — AB (ref 3.87–5.11)
RDW: 13.4 % (ref 11.5–15.5)
WBC: 9.7 10*3/uL (ref 4.0–10.5)

## 2014-06-03 LAB — COMPREHENSIVE METABOLIC PANEL
ALBUMIN: 2.2 g/dL — AB (ref 3.5–5.2)
ALK PHOS: 71 U/L (ref 39–117)
ALT: 17 U/L (ref 0–35)
ANION GAP: 13 (ref 5–15)
AST: 18 U/L (ref 0–37)
BILIRUBIN TOTAL: 0.4 mg/dL (ref 0.3–1.2)
BUN: 13 mg/dL (ref 6–23)
CO2: 27 meq/L (ref 19–32)
CREATININE: 0.66 mg/dL (ref 0.50–1.10)
Calcium: 8.8 mg/dL (ref 8.4–10.5)
Chloride: 93 mEq/L — ABNORMAL LOW (ref 96–112)
GLUCOSE: 110 mg/dL — AB (ref 70–99)
POTASSIUM: 4 meq/L (ref 3.7–5.3)
Sodium: 133 mEq/L — ABNORMAL LOW (ref 137–147)
Total Protein: 5.8 g/dL — ABNORMAL LOW (ref 6.0–8.3)

## 2014-06-03 LAB — LIPASE, BLOOD: LIPASE: 20 U/L (ref 11–59)

## 2014-06-03 MED ORDER — OXYCODONE-ACETAMINOPHEN 5-325 MG PO TABS
2.0000 | ORAL_TABLET | Freq: Once | ORAL | Status: AC
  Administered 2014-06-03: 2 via ORAL
  Filled 2014-06-03: qty 2

## 2014-06-03 MED ORDER — LOSARTAN POTASSIUM-HCTZ 100-12.5 MG PO TABS: 1.0000 | ORAL_TABLET | Freq: Every day | ORAL | Status: DC

## 2014-06-03 MED ORDER — ATORVASTATIN CALCIUM 40 MG PO TABS
40.0000 mg | ORAL_TABLET | Freq: Every day | ORAL | Status: DC
  Filled 2014-06-03: qty 1

## 2014-06-03 MED ORDER — LORAZEPAM 1 MG PO TABS
1.0000 mg | ORAL_TABLET | Freq: Every day | ORAL | Status: DC | PRN
  Administered 2014-06-03: 1 mg via ORAL
  Filled 2014-06-03: qty 1

## 2014-06-03 MED ORDER — ALBUTEROL SULFATE (2.5 MG/3ML) 0.083% IN NEBU: 3.0000 mL | INHALATION_SOLUTION | Freq: Four times a day (QID) | RESPIRATORY_TRACT | Status: DC | PRN

## 2014-06-03 MED ORDER — AMLODIPINE BESYLATE 5 MG PO TABS
5.0000 mg | ORAL_TABLET | Freq: Every day | ORAL | Status: DC
  Filled 2014-06-03: qty 1

## 2014-06-03 MED ORDER — ENOXAPARIN SODIUM 30 MG/0.3ML ~~LOC~~ SOLN
30.0000 mg | Freq: Two times a day (BID) | SUBCUTANEOUS | Status: DC
  Administered 2014-06-03: 30 mg via SUBCUTANEOUS
  Filled 2014-06-03 (×3): qty 0.3

## 2014-06-03 MED ORDER — LOSARTAN POTASSIUM 50 MG PO TABS
100.0000 mg | ORAL_TABLET | Freq: Every day | ORAL | Status: DC
  Administered 2014-06-03: 100 mg via ORAL
  Filled 2014-06-03: qty 2

## 2014-06-03 MED ORDER — METOPROLOL TARTRATE 12.5 MG HALF TABLET
12.5000 mg | ORAL_TABLET | Freq: Two times a day (BID) | ORAL | Status: DC
  Filled 2014-06-03 (×2): qty 1

## 2014-06-03 MED ORDER — OXYCODONE-ACETAMINOPHEN 5-325 MG PO TABS
1.0000 | ORAL_TABLET | ORAL | Status: DC | PRN
  Administered 2014-06-03: 2 via ORAL
  Administered 2014-06-03: 1 via ORAL
  Administered 2014-06-03: 2 via ORAL
  Filled 2014-06-03 (×3): qty 2

## 2014-06-03 MED ORDER — GUAIFENESIN 100 MG/5ML PO SOLN
100.0000 mg | Freq: Three times a day (TID) | ORAL | Status: DC | PRN
  Filled 2014-06-03: qty 5

## 2014-06-03 MED ORDER — GABAPENTIN 100 MG PO CAPS
100.0000 mg | ORAL_CAPSULE | Freq: Every day | ORAL | Status: DC
  Filled 2014-06-03: qty 1

## 2014-06-03 MED ORDER — ALBUTEROL SULFATE HFA 108 (90 BASE) MCG/ACT IN AERS: 1.0000 | INHALATION_SPRAY | Freq: Four times a day (QID) | RESPIRATORY_TRACT | Status: DC | PRN

## 2014-06-03 MED ORDER — HYDROCHLOROTHIAZIDE 12.5 MG PO CAPS
12.5000 mg | ORAL_CAPSULE | Freq: Every day | ORAL | Status: DC
  Administered 2014-06-03: 12.5 mg via ORAL
  Filled 2014-06-03: qty 1

## 2014-06-03 MED ORDER — BENZONATATE 100 MG PO CAPS: 100.0000 mg | ORAL_CAPSULE | Freq: Three times a day (TID) | ORAL | Status: DC | PRN

## 2014-06-03 MED ORDER — SODIUM CHLORIDE 0.9 % IV BOLUS (SEPSIS): 1000.0000 mL | Freq: Once | INTRAVENOUS | Status: DC

## 2014-06-03 NOTE — Clinical Social Work Note (Signed)
CSW received update regarding pt's readmission to [facility] on 06/03/2014 after discharge to [facility] on 06/02/2014.  CSW met with pt at bedside to discuss discharge disposition options. Pt's daughter present at bedside during conversation. Pt informed CSW pt would prefer to be discharged home with home health (requesting [place]). Pt and pt's daughter confirmed pt will receive 24/7 support provided by friends and family.   Pt and pt's daughter expressed pt's concerns regarding discharge on 06/02/2014. CSW [facility] and [facility] admissions liaison updated regarding pt's concerns.  RNCM and MD updated regarding discharge disposition.  Lubertha Sayres, [place] [phone]) Licensed Clinical Social Worker Orthopedics [phone]) and Surgical [phone])

## 2014-06-03 NOTE — Evaluation (Signed)
Physical Therapy Evaluation Patient Details Name: STORMEE DUDA MRN: [id number] DOB: Jul 09, 1950 Today's Date: 06/03/2014   History of Present Illness  CHARMAGNE BUHL is a 63 y.o. female who is POD s/p THA of left hip.  She was discharged to Rehab at [facility] yesterday; however, the Rehab only gave the patient OTC tylenol for pain and patient states they were rude to her.  Patient states NH told her that they hadnt received paperwork with other meds (per our notes patient was to be discharged on percocet for pain).  Patient called EMS and was brought to the ED.  Clinical Impression  *Pt is s/p THA resulting in the deficits listed below (see PT Problem List). ** Pt will benefit from skilled PT to increase their independence and safety with mobility to allow discharge to the venue listed below.   Pt ambulated 51' with RW. Stair training completed with pt/caregiver. HHPT recommended for progression of mobility, strengthening of LLE, and reinforcement of hip precautions.    **    Follow Up Recommendations Home health PT    Equipment Recommendations  None recommended by PT    Recommendations for Other Services       Precautions / Restrictions Precautions Precautions: Posterior Hip;Fall Precaution Comments: Reviewed precautions. Pt verbalized 2 of 3 precautions.  Restrictions LLE Weight Bearing: Weight bearing as tolerated      Mobility  Bed Mobility Overal bed mobility: Needs Assistance Bed Mobility: Supine to Sit     Supine to sit: Min assist     General bed mobility comments: Cues for technique and min assist for LLE   Transfers Overall transfer level: Needs assistance Equipment used: Rolling walker (2 wheeled) Transfers: Sit to/from Stand Sit to Stand: Min guard         General transfer comment: Cues for precautions, hand placement and technique  Ambulation/Gait Ambulation/Gait assistance: Min guard Ambulation Distance (Feet): 55 Feet Assistive  device: Rolling walker (2 wheeled) Gait Pattern/deviations: Step-to pattern Gait velocity: decreased Gait velocity interpretation: Below normal speed for age/gender General Gait Details: distance limited by fatigue  Stairs Stairs: Yes Stairs assistance: Min guard Stair Management: Two rails;Step to pattern;Forwards Number of Stairs: 5 General stair comments: verbal cues for sequencing  Wheelchair Mobility    Modified Rankin (Stroke Patients Only)       Balance     Sitting balance-Leahy Scale: Good Sitting balance - Comments: denied any dizziness     Standing balance-Leahy Scale: Poor                               Pertinent Vitals/Pain Pain Score: 5  Pain Location: L hip with mobility Pain Descriptors / Indicators: Crying;Sore Pain Intervention(s): Limited activity within patient's tolerance;Monitored during session;Premedicated before session;Ice applied    Home Living Family/patient expects to be discharged to:: Private residence (may need SNF is progress remains slow) Living Arrangements: Other relatives;Non-relatives/Friends Available Help at Discharge: Friend(s);Family;Available 24 hours/day Type of Home: House Home Access: Stairs to enter Entrance Stairs-Rails: Right Entrance Stairs-Number of Steps: 2 Home Layout: One level Home Equipment: Walker - 2 wheels;Bedside commode;Shower seat;Adaptive equipment Additional Comments: all DME from previous surgeries     Prior Function Level of Independence: Independent with assistive device(s)         Comments: ambulated with cane as needed      Hand Dominance   Dominant Hand: Right    Extremity/Trunk Assessment  Lower Extremity Assessment: LLE deficits/detail   LLE Deficits / Details: knee extension +2/5, ankle WNL, hip 2/5, hip AAROM and knee AAROM WFL  Cervical / Trunk Assessment: Normal  Communication   Communication: No difficulties  Cognition Arousal/Alertness:  Awake/alert Behavior During Therapy: Flat affect Overall Cognitive Status: Within Functional Limits for tasks assessed                      General Comments      Exercises Total Joint Exercises Ankle Circles/Pumps: AROM;Both;10 reps Quad Sets: AROM;Left;10 reps Short Arc QuadSinclair Ship;Left;10 reps;Supine Heel Slides: AAROM;Strengthening;Left;10 reps;Supine Hip ABduction/ADduction: AAROM;Strengthening;Left;10 reps;Supine Long Arc Quad: AROM;Left;10 reps      Assessment/Plan    PT Assessment All further PT needs can be met in the next venue of care (pt plans to DC home today)  PT Diagnosis Difficulty walking;Generalized weakness;Acute pain   PT Problem List Decreased strength;Decreased activity tolerance;Decreased balance;Pain;Decreased knowledge of precautions;Decreased mobility  PT Treatment Interventions     PT Goals (Current goals can be found in the Care Plan section) Acute Rehab PT Goals Patient Stated Goal: looks forward to wearing high heels and going out; wants to be able to stand long enough to cook a meal, be able to dance PT Goal Formulation: With patient Time For Goal Achievement: 06/04/14 Potential to Achieve Goals: Good    Frequency     Barriers to discharge        Co-evaluation               End of Session Equipment Utilized During Treatment: Gait belt Activity Tolerance: Patient limited by fatigue Patient left: in chair;with call bell/phone within reach;with family/visitor present Nurse Communication: Mobility status    Functional Assessment Tool Used: clinical judgement Functional Limitation: Mobility: Walking and moving around Mobility: Walking and Moving Around Current Status [phone]): At least 20 percent but less than 40 percent impaired, limited or restricted Mobility: Walking and Moving Around Goal Status [phone]): At least 20 percent but less than 40 percent impaired, limited or restricted Mobility: Walking and Moving Around Discharge  Status [phone]): At least 20 percent but less than 40 percent impaired, limited or restricted    Time: 3838-1840 PT Time Calculation (min) (ACUTE ONLY): 35 min   Charges:   PT Evaluation $Initial PT Evaluation Tier I: 1 Procedure PT Treatments $Gait Training: 8-22 mins $Therapeutic Exercise: 8-22 mins   PT G Codes:   Functional Assessment Tool Used: clinical judgement Functional Limitation: Mobility: Walking and moving around    Ensign, [employer] 06/03/2014, 2:00 PM  [phone]

## 2014-06-03 NOTE — ED Notes (Signed)
Per Dr. Alcario Drought, pt does not need IV.

## 2014-06-03 NOTE — Progress Notes (Signed)
Patient called facility asking about prescription for pain medication. There was no order in patient's chart for pain medication on discharge today and it is believed that the order for this medication was sent with patient when she was first discharged on 06/02/14 to [facility]. A call was placed to [facility] to inquire about this situation and was I put on hold for 20 minutes. Patient's spouse came to the unit and explained to day charge RN that patient had some pain medication (Percocet) at home (and patient told day charge RN the same). Spouse was instructed to have patient call Dr. Alvester Morin office tomorrow to get a new script if needed.

## 2014-06-03 NOTE — Discharge Summary (Signed)
Physician Discharge Summary  Morgan Torres IRS:[id number] DOB: [birth year] DOA: 06/02/2014  PCP: Maggie Font, MD  Admit date: 06/02/2014 Discharge date: 06/03/2014  Time spent: > 35 minutes  Recommendations for Outpatient Follow-up:  1. Please monitor cbc 2. Will d/c amlodipine given softer blood pressures  Discharge Diagnoses:  Active Problems:   Hip pain   Discharge Condition: stable  Diet recommendation: Heart healthy  There were no vitals filed for this visit.  History of present illness:  According to HPI: 63 y.o. female who is POD s/p THA of left hip. She was discharged to Rehab at [facility] yesterday; however, the Rehab only gave the patient OTC tylenol for pain and patient states they were rude to her.  Hospital Course:  THA of left hip - set up home health PT/OT - Pt to f/u with ortho for continued evaluation and recommendations post discharge.  Procedures:  None this admission  Consultations:  none  Discharge Exam: Filed Vitals:   06/03/14 0957  BP: 108/55  Pulse:   Temp:   Resp:     General: Pt in nad,alert and awake Cardiovascular: rrr, no mrg Respiratory: cta bl, no wheezes  Discharge Instructions You were cared for by a hospitalist during your hospital stay. If you have any questions about your discharge medications or the care you received while you were in the hospital after you are discharged, you can call the unit and asked to speak with the hospitalist on call if the hospitalist that took care of you is not available. Once you are discharged, your primary care physician will handle any further medical issues. Please note that NO REFILLS for any discharge medications will be authorized once you are discharged, as it is imperative that you return to your primary care physician (or establish a relationship with a primary care physician if you do not have one) for your aftercare needs so that they can reassess your need for  medications and monitor your lab values.  Discharge Instructions    Call MD for:  severe uncontrolled pain    Complete by:  As directed      Call MD for:  temperature >100.4    Complete by:  As directed      Diet - low sodium heart healthy    Complete by:  As directed      Discharge instructions    Complete by:  As directed   D/c home with home health.  Recommend patient f/u with her orthopaedic surgeon or pcp within the next 1-2 weeks.     Increase activity slowly    Complete by:  As directed           Current Discharge Medication List    CONTINUE these medications which have NOT CHANGED   Details  albuterol (PROVENTIL HFA;VENTOLIN HFA) 108 (90 BASE) MCG/ACT inhaler Inhale 1-2 puffs into the lungs every 6 (six) hours as needed for wheezing or shortness of breath.    atorvastatin (LIPITOR) 40 MG tablet TAKE 1 TABLET BY MOUTH DAILY AT 6 PM Qty: 90 tablet, Refills: 2    benzonatate (TESSALON) 100 MG capsule Take 100 mg by mouth 3 (three) times daily as needed for cough.    enoxaparin (LOVENOX) 30 MG/0.3ML injection Inject 0.3 mLs (30 mg total) into the skin every 12 (twelve) hours. Qty: 24 Syringe, Refills: 0    EPINEPHrine (EPIPEN 2-PAK) 0.3 mg/0.3 mL DEVI Inject 0.3 mLs (0.3 mg total) into the muscle once as needed (for  severe allergic reaction). CAll 911 immediately if you have to use this medicine Qty: 1 Device, Refills: 1    gabapentin (NEURONTIN) 100 MG capsule Take 100 mg by mouth at bedtime.    guaiFENesin (ROBITUSSIN) 100 MG/5ML liquid Take 100 mg by mouth 3 (three) times daily as needed for cough or congestion.    LORazepam (ATIVAN) 1 MG tablet Take 1 mg by mouth daily as needed for anxiety.    losartan-hydrochlorothiazide (HYZAAR) 100-12.5 MG per tablet Take 1 tablet by mouth daily.    metoprolol tartrate (LOPRESSOR) 25 MG tablet Take 0.5 tablets (12.5 mg total) by mouth 2 (two) times daily. Qty: 30 tablet, Refills: 10    nitroGLYCERIN (NITROSTAT) 0.4 MG SL  tablet Place 1 tablet (0.4 mg total) under the tongue every 5 (five) minutes x 3 doses as needed for chest pain. Qty: 25 tablet, Refills: 2    OVER THE COUNTER MEDICATION Take 1 tablet by mouth daily. Herbal Life - Dietary Supplement    oxyCODONE-acetaminophen (ROXICET) 5-325 MG per tablet Take 1-2 tablets by mouth every 4 (four) hours as needed for severe pain. Qty: 90 tablet, Refills: 0      STOP taking these medications     amLODipine (NORVASC) 5 MG tablet        Allergies  Allergen Reactions  . Diclofenac Anaphylaxis  . Codeine Itching  . Hydrocodone Itching      The results of significant diagnostics from this hospitalization (including imaging, microbiology, ancillary and laboratory) are listed below for reference.    Significant Diagnostic Studies: Dg Chest 2 View  05/18/2014   CLINICAL DATA:  Preoperative evaluation for hip replacement  EXAM: CHEST  2 VIEW  COMPARISON:  03/29/2013  FINDINGS: Cardiac shadow is within normal limits. The lungs are well aerated bilaterally. The thoracic spine shows degenerative changes stable from the prior exam.  IMPRESSION: No acute abnormality noted.   Electronically Signed   By: Inez Catalina M.D.   On: 05/18/2014 16:02   Dg Pelvis Portable  05/28/2014   CLINICAL DATA:  Postop left hip, left hip osteoarthritis  EXAM: PORTABLE PELVIS 1-2 VIEWS  COMPARISON:  None.  FINDINGS: Two portable views of the pelvis submitted. There is left hip prosthesis anatomic alignment.  IMPRESSION: Left hip prosthesis in anatomic alignment.   Electronically Signed   By: Lahoma Crocker M.D.   On: 05/28/2014 11:53   Dg Hip Portable 1 View Left  05/28/2014   CLINICAL DATA:  Postop left hip  EXAM: PORTABLE LEFT HIP - 1 VIEW  COMPARISON:  Portable pelvis same day  FINDINGS: Single cross-table lateral view of the left hip submitted. There is left hip prosthesis in anatomic alignment. Postsurgical changes are noted with lateral skin staples.  IMPRESSION: Left hip prosthesis  in anatomic alignment.   Electronically Signed   By: Lahoma Crocker M.D.   On: 05/28/2014 12:05    Microbiology: No results found for this or any previous visit (from the past 240 hour(s)).   Labs: Basic Metabolic Panel:  Recent Labs Lab 05/28/14 1345 05/29/14 0730 06/03/14 0041  NA  --  136* 133*  K  --  4.4 4.0  CL  --  98 93*  CO2  --  23 27  GLUCOSE  --  121* 110*  BUN  --  20 13  CREATININE 0.73 0.76 0.66  CALCIUM  --  8.5 8.8   Liver Function Tests:  Recent Labs Lab 06/03/14 0041  AST 18  ALT 17  ALKPHOS 71  BILITOT 0.4  PROT 5.8*  ALBUMIN 2.2*    Recent Labs Lab 06/03/14 0041  LIPASE 20   No results for input(s): AMMONIA in the last 168 hours. CBC:  Recent Labs Lab 05/28/14 1345 05/29/14 0730 05/30/14 0655 05/31/14 0705 06/03/14 0041  WBC 15.5* 7.7 9.2 7.4 9.7  NEUTROABS  --   --   --   --  5.8  HGB 12.8 10.0* 9.8* 8.7* 8.5*  HCT 36.7 29.6* 30.3* 26.5* 25.7*  MCV 93.1 94.3 95.0 94.0 93.1  PLT 223 206 185 177 280   Cardiac Enzymes: No results for input(s): CKTOTAL, CKMB, CKMBINDEX, TROPONINI in the last 168 hours. BNP: BNP (last 3 results) No results for input(s): PROBNP in the last 8760 hours. CBG: No results for input(s): GLUCAP in the last 168 hours.     Signed:  [name]  [facility] 06/03/2014, 1:28 PM

## 2014-06-03 NOTE — Progress Notes (Signed)
mepilex 

## 2014-06-03 NOTE — Progress Notes (Signed)
Patient was incontinent of urine. New dressing applied.

## 2014-06-03 NOTE — Progress Notes (Signed)
Patient seen and evaluated overnight. Please refer to H and P for details regarding assessment and plan.  Discussed with social worker who is currently assisting patient with placement.  Will reassess next am.  [name]

## 2014-06-03 NOTE — Progress Notes (Signed)
CARE MANAGEMENT NOTE 06/03/2014  Patient:  Morgan Torres, Morgan Torres   Account Number:  [id number]  Date Initiated:  06/03/2014  Documentation initiated by:  [facility]  Subjective/Objective Assessment:   discharged 06/02/14 s/p left THA to [facility], was unhappy with facility and return to ED due to left hip pain     Action/Plan:   patient chose to return home with HHPT, HHOT and social work   Anticipated DC Date:  06/03/2014   Anticipated DC Plan:  [place]  In-house referral  Clinical Social Worker      [place]  CM consult      [facility] Choice  HOME HEALTH   Choice offered to / List presented to:  C-1 Patient        [place] arranged  [place].   Status of service:  Completed, signed off Medicare Important Message given?   (If response is "NO", the following Medicare IM given date fields will be blank) Date Medicare IM given:   Medicare IM given by:   Date Additional Medicare IM given:   Additional Medicare IM given by:    Discharge Disposition:  [place]  Per UR Regulation:    If discussed at Long Length of Stay Meetings, dates discussed:    Comments:  06/03/14 Spoke with patient and her daughter about [place], they chose [facility]. Contacted Morgan Torres at [place] and set up [place], [place] and Education officer, museum. Patient states that she has a rolling walker and 3N1 at home.Patient's daughter will be assisting her at home. Set up [facility] social worker in case patient decides tha she does wish to reconsider SNF. Fuller Plan RN, BSN, CCM

## 2014-06-03 NOTE — Progress Notes (Signed)
Wears glasses

## 2014-06-03 NOTE — Progress Notes (Signed)
UR completed 

## 2014-06-03 NOTE — H&P (Addendum)
[facility] History and Physical  Morgan Torres GQQ:[id number] DOB: 18-Jun-1951 DOA: 06/02/2014  Referring physician: EDP PCP: Maggie Font, MD   Chief Complaint: Social problem   HPI: Morgan Torres is a 63 y.o. female who is POD s/p THA of left hip.  She was discharged to Rehab at [facility] yesterday; however, the Rehab only gave the patient OTC tylenol for pain and patient states they were rude to her.  Patient states NH told her that they hadnt received paperwork with other meds (per our notes patient was to be discharged on percocet for pain).  Patient called EMS and was brought to the ED.  It now sounds like patient does not want to go back to that NH and/or NH does not want to accept patient back.  Review of Systems: Systems reviewed.  As above, otherwise negative  Past Medical History  Diagnosis Date  . Lumbar herniated disc   . Sciatica   . Hypertension   . Small bowel obstruction   . COPD (chronic obstructive pulmonary disease)   . Depression   . Anxiety   . Bronchitis     Hx: of  . Pneumonia     Hx: of  . Fibromyalgia   . Myocardial infarction 2014    followed by Dr. Debara Pickett, treated medically, no stents  . Asthma     seasonal allergies   . Arthritis     hip, lumbar spine   . Coronary artery disease    Past Surgical History  Procedure Laterality Date  . Abdominal surgery      resection of small intestine  . Abdominal hysterectomy    . Tonsillectomy    . Dilation and curettage of uterus    . Colonoscopy      Hx; of  . Colon surgery      resection for bowel - for obstruction   . Back surgery      fusion  . Cardiac catheterization  2014  . Hernia repair  1970's    inguinal hernia   . Salivary gland surgery Left 1970-1980    approached from inside mouth & side of neck  . Total hip arthroplasty Left 05/28/2014    Procedure: LEFT TOTAL HIP ARTHROPLASTY;  Surgeon: Yvette Rack., MD;  Location: [place] [facility];  Service: Orthopedics;  Laterality:  Left;  . Total hip arthroplasty Left 05/28/2014    dr caffrey   Social History:  reports that she has been smoking Cigarettes.  She has a 10 pack-year smoking history. She has never used smokeless tobacco. She reports that she drinks alcohol. She reports that she does not use illicit drugs.  Allergies  Allergen Reactions  . Diclofenac Anaphylaxis  . Codeine Itching  . Hydrocodone Itching    Family History  Problem Relation Age of Onset  . Hypertension Mother   . Diabetes Mother   . Cancer - Other Mother   . Cancer Sister      Prior to Admission medications   Medication Sig Start Date End Date Taking? Authorizing Provider  albuterol (PROVENTIL HFA;VENTOLIN HFA) 108 (90 BASE) MCG/ACT inhaler Inhale 1-2 puffs into the lungs every 6 (six) hours as needed for wheezing or shortness of breath. 05/11/12   Harden Mo, MD  amLODipine (NORVASC) 5 MG tablet Take 1 tablet (5 mg total) by mouth daily. 05/29/14   Chriss Czar, PA-C  atorvastatin (LIPITOR) 40 MG tablet TAKE 1 TABLET BY MOUTH DAILY AT 6 PM 05/19/14  Pixie Casino, MD  benzonatate (TESSALON) 100 MG capsule Take 100 mg by mouth 3 (three) times daily as needed for cough.    Historical Provider, MD  enoxaparin (LOVENOX) 30 MG/0.3ML injection Inject 0.3 mLs (30 mg total) into the skin every 12 (twelve) hours. 05/29/14   Chriss Czar, PA-C  EPINEPHrine (EPIPEN 2-PAK) 0.3 mg/0.3 mL DEVI Inject 0.3 mLs (0.3 mg total) into the muscle once as needed (for severe allergic reaction). CAll 911 immediately if you have to use this medicine 11/26/12   Anderson Malta L Piepenbrink, PA-C  gabapentin (NEURONTIN) 100 MG capsule Take 100 mg by mouth at bedtime.    Historical Provider, MD  guaiFENesin (ROBITUSSIN) 100 MG/5ML liquid Take 100 mg by mouth 3 (three) times daily as needed for cough or congestion.    Historical Provider, MD  LORazepam (ATIVAN) 1 MG tablet Take 1 mg by mouth daily as needed for anxiety.    Historical Provider, MD   losartan-hydrochlorothiazide (HYZAAR) 100-12.5 MG per tablet Take 1 tablet by mouth daily.    Historical Provider, MD  metoprolol tartrate (LOPRESSOR) 25 MG tablet Take 0.5 tablets (12.5 mg total) by mouth 2 (two) times daily. 04/13/14   Pixie Casino, MD  nitroGLYCERIN (NITROSTAT) 0.4 MG SL tablet Place 1 tablet (0.4 mg total) under the tongue every 5 (five) minutes x 3 doses as needed for chest pain. 04/01/13   Erlene Quan, PA-C  OVER THE COUNTER MEDICATION Take 1 tablet by mouth daily. Herbal Life - Dietary Supplement    Historical Provider, MD  oxyCODONE-acetaminophen (ROXICET) 5-325 MG per tablet Take 1-2 tablets by mouth every 4 (four) hours as needed for severe pain. 05/29/14   Chriss Czar, PA-C   Physical Exam: Filed Vitals:   06/03/14 0006  BP: 116/48  Pulse: 76  Temp: 99.7 F (37.6 C)  Resp: 15    BP 116/48 mmHg  Pulse 76  Temp(Src) 99.7 F (37.6 C) (Oral)  Resp 15  SpO2 99%  General Appearance:    Alert, oriented, no distress, appears stated age  Head:    Normocephalic, atraumatic  Eyes:    PERRL, EOMI, sclera non-icteric        Nose:   Nares without drainage or epistaxis. Mucosa, turbinates normal  Throat:   Moist mucous membranes. Oropharynx without erythema or exudate.  Neck:   Supple. No carotid bruits.  No thyromegaly.  No lymphadenopathy.   Back:     No CVA tenderness, no spinal tenderness  Lungs:     Clear to auscultation bilaterally, without wheezes, rhonchi or rales  Chest wall:    No tenderness to palpitation  Heart:    Regular rate and rhythm without murmurs, gallops, rubs  Abdomen:     Soft, non-tender, nondistended, normal bowel sounds, no organomegaly  Genitalia:    deferred  Rectal:    deferred  Extremities:   No clubbing, cyanosis or edema.  Pulses:   2+ and symmetric all extremities  Skin:   Skin color, texture, turgor normal, no rashes or lesions  Lymph nodes:   Cervical, supraclavicular, and axillary nodes normal  Neurologic:   CNII-XII  intact. Normal strength, sensation and reflexes      throughout    Labs on Admission:  Basic Metabolic Panel:  Recent Labs Lab 05/28/14 1345 05/29/14 0730 06/03/14 0041  NA  --  136* 133*  K  --  4.4 4.0  CL  --  98 93*  CO2  --  23 27  GLUCOSE  --  121* 110*  BUN  --  20 13  CREATININE 0.73 0.76 0.66  CALCIUM  --  8.5 8.8   Liver Function Tests:  Recent Labs Lab 06/03/14 0041  AST 18  ALT 17  ALKPHOS 71  BILITOT 0.4  PROT 5.8*  ALBUMIN 2.2*    Recent Labs Lab 06/03/14 0041  LIPASE 20   No results for input(s): AMMONIA in the last 168 hours. CBC:  Recent Labs Lab 05/28/14 1345 05/29/14 0730 05/30/14 0655 05/31/14 0705 06/03/14 0041  WBC 15.5* 7.7 9.2 7.4 9.7  NEUTROABS  --   --   --   --  5.8  HGB 12.8 10.0* 9.8* 8.7* 8.5*  HCT 36.7 29.6* 30.3* 26.5* 25.7*  MCV 93.1 94.3 95.0 94.0 93.1  PLT 223 206 185 177 280   Cardiac Enzymes: No results for input(s): CKTOTAL, CKMB, CKMBINDEX, TROPONINI in the last 168 hours.  BNP (last 3 results) No results for input(s): PROBNP in the last 8760 hours. CBG: No results for input(s): GLUCAP in the last 168 hours.  Radiological Exams on Admission: No results found.  EKG: Independently reviewed.  Assessment/Plan Active Problems:   Hip pain   1. Hip pain - untreated post-operative hip pain due to patient not receiving percocet at rehab. 1. Percocet for pain 2. Admit to observation for SW and CW consultation for a different rehab facility placement 3. PT/OT while here. 4. Patient is a difficult IV stick with no new acute medical issues since discharge, have said that its okay for patient not to have IV access while on floor.  Discussed reasoning with patient behind this and all questions answered.   Code Status: Full code  Family Communication: Family at bedside Disposition Plan: Admit to obs   Time spent: 30 min  GARDNER, JARED M. [facility] Pager [phone]  If 7AM-7PM, please contact the  day team taking care of the patient [email or web address] Password [email or web address] 06/03/2014, 2:25 AM

## 2014-10-17 ENCOUNTER — Emergency Department (HOSPITAL_COMMUNITY): Payer: Medicare HMO

## 2014-10-17 ENCOUNTER — Emergency Department (HOSPITAL_COMMUNITY)
Admission: EM | Admit: 2014-10-17 | Discharge: 2014-10-17 | Disposition: A | Discharge status: Home / Self Care | Payer: Medicare HMO | Attending: Emergency Medicine | Admitting: Emergency Medicine

## 2014-10-17 ENCOUNTER — Encounter (HOSPITAL_COMMUNITY): Payer: Self-pay | Admitting: *Deleted

## 2014-10-17 DIAGNOSIS — M797 Fibromyalgia: Secondary | ICD-10-CM | POA: Insufficient documentation

## 2014-10-17 DIAGNOSIS — Z8701 Personal history of pneumonia (recurrent): Secondary | ICD-10-CM | POA: Insufficient documentation

## 2014-10-17 DIAGNOSIS — I10 Essential (primary) hypertension: Secondary | ICD-10-CM | POA: Diagnosis not present

## 2014-10-17 DIAGNOSIS — Z79899 Other long term (current) drug therapy: Secondary | ICD-10-CM | POA: Insufficient documentation

## 2014-10-17 DIAGNOSIS — I2510 Atherosclerotic heart disease of native coronary artery without angina pectoris: Secondary | ICD-10-CM | POA: Insufficient documentation

## 2014-10-17 DIAGNOSIS — Z8659 Personal history of other mental and behavioral disorders: Secondary | ICD-10-CM | POA: Diagnosis not present

## 2014-10-17 DIAGNOSIS — Z720 Tobacco use: Secondary | ICD-10-CM | POA: Insufficient documentation

## 2014-10-17 DIAGNOSIS — I252 Old myocardial infarction: Secondary | ICD-10-CM | POA: Diagnosis not present

## 2014-10-17 DIAGNOSIS — J441 Chronic obstructive pulmonary disease with (acute) exacerbation: Secondary | ICD-10-CM | POA: Diagnosis not present

## 2014-10-17 DIAGNOSIS — Z8719 Personal history of other diseases of the digestive system: Secondary | ICD-10-CM | POA: Insufficient documentation

## 2014-10-17 DIAGNOSIS — R05 Cough: Secondary | ICD-10-CM | POA: Diagnosis present

## 2014-10-17 DIAGNOSIS — J40 Bronchitis, not specified as acute or chronic: Secondary | ICD-10-CM

## 2014-10-17 MED ORDER — ACETAMINOPHEN-GUAIFENESIN 325-200 MG PO TABS: 2.0000 | ORAL_TABLET | Freq: Three times a day (TID) | ORAL | Status: DC

## 2014-10-17 MED ORDER — ACETAMINOPHEN 325 MG PO TABS
650.0000 mg | ORAL_TABLET | Freq: Once | ORAL | Status: AC
  Administered 2014-10-17: 650 mg via ORAL
  Filled 2014-10-17: qty 2

## 2014-10-17 MED ORDER — IPRATROPIUM BROMIDE 0.02 % IN SOLN
0.5000 mg | Freq: Once | RESPIRATORY_TRACT | Status: AC
  Administered 2014-10-17: 0.5 mg via RESPIRATORY_TRACT
  Filled 2014-10-17: qty 2.5

## 2014-10-17 MED ORDER — AZITHROMYCIN 250 MG PO TABS: 250.0000 mg | ORAL_TABLET | Freq: Every day | ORAL | Status: DC

## 2014-10-17 MED ORDER — ALBUTEROL SULFATE (2.5 MG/3ML) 0.083% IN NEBU
5.0000 mg | INHALATION_SOLUTION | Freq: Once | RESPIRATORY_TRACT | Status: AC
  Administered 2014-10-17: 5 mg via RESPIRATORY_TRACT
  Filled 2014-10-17: qty 6

## 2014-10-17 MED ORDER — ALBUTEROL SULFATE HFA 108 (90 BASE) MCG/ACT IN AERS
2.0000 | INHALATION_SPRAY | RESPIRATORY_TRACT | Status: AC
  Administered 2014-10-17: 2 via RESPIRATORY_TRACT
  Filled 2014-10-17: qty 6.7

## 2014-10-17 NOTE — ED Provider Notes (Signed)
CSN: [id number]     Arrival date & time 10/17/14  1732 History   First MD Initiated Contact with Patient 10/17/14 1900     Chief Complaint  Patient presents with  . Cough  . Generalized Body Aches   (Consider location/radiation/quality/duration/timing/severity/associated sxs/prior Treatment) HPI  Morgan Torres is a 64 year old female presenting with cough and generalized body aches. She states he symptoms started 2 days ago. She first noted productive cough with tannish mucus, followed by muscle and body aches. She also reports chills and fever of 99. She has not taken any medicine at home. She currently rates her muscle aches as 9 out of 10. She denies any nausea, vomiting or diarrhea. She also denies any chest pain except when coughing.  Past Medical History  Diagnosis Date  . Lumbar herniated disc   . Sciatica   . Hypertension   . Small bowel obstruction   . COPD (chronic obstructive pulmonary disease)   . Depression   . Anxiety   . Bronchitis     Hx: of  . Pneumonia     Hx: of  . Fibromyalgia   . Myocardial infarction 2014    followed by Dr. Debara Pickett, treated medically, no stents  . Asthma     seasonal allergies   . Arthritis     hip, lumbar spine   . Coronary artery disease    Past Surgical History  Procedure Laterality Date  . Abdominal surgery      resection of small intestine  . Abdominal hysterectomy    . Tonsillectomy    . Dilation and curettage of uterus    . Colonoscopy      Hx; of  . Colon surgery      resection for bowel - for obstruction   . Back surgery      fusion  . Cardiac catheterization  2014  . Hernia repair  1970's    inguinal hernia   . Salivary gland surgery Left 1970-1980    approached from inside mouth & side of neck  . Total hip arthroplasty Left 05/28/2014    Procedure: LEFT TOTAL HIP ARTHROPLASTY;  Surgeon: Yvette Rack., MD;  Location: [place];  Service: Orthopedics;  Laterality: Left;  . Total hip arthroplasty Left 05/28/2014    dr  caffrey  . Left heart catheterization with coronary angiogram N/A 03/30/2013    Procedure: LEFT HEART CATHETERIZATION WITH CORONARY ANGIOGRAM;  Surgeon: Leonie Man, MD;  Location: [facility] CATH LAB;  Service: Cardiovascular;  Laterality: N/A;   Family History  Problem Relation Age of Onset  . Hypertension Mother   . Diabetes Mother   . Cancer - Other Mother   . Cancer Sister    History  Substance Use Topics  . Smoking status: Current Every Day Smoker -- 0.40 packs/day for 25 years    Types: Cigarettes  . Smokeless tobacco: Never Used     Comment: Currently on the Nicoderm patch."smokes a few"  . Alcohol Use: Yes     Comment: 1 glass of wine occas. (1 every 2 weeks or so)   OB History    No data available     Review of Systems  Constitutional: Positive for chills. Negative for fever.  HENT: Positive for congestion and rhinorrhea. Negative for sore throat.   Eyes: Negative for visual disturbance.  Respiratory: Positive for cough and shortness of breath.   Cardiovascular: Negative for chest pain and leg swelling.  Gastrointestinal: Negative for nausea, vomiting and diarrhea.  Genitourinary: Negative for dysuria.  Musculoskeletal: Negative for myalgias.  Skin: Negative for rash.  Neurological: Negative for weakness, numbness and headaches.    Allergies  Diclofenac; Codeine; and Hydrocodone  Home Medications   Prior to Admission medications   Medication Sig Start Date End Date Taking? Authorizing Provider  albuterol (PROVENTIL HFA;VENTOLIN HFA) 108 (90 BASE) MCG/ACT inhaler Inhale 1-2 puffs into the lungs every 6 (six) hours as needed for wheezing or shortness of breath. 05/11/12   Harden Mo, MD  atorvastatin (LIPITOR) 40 MG tablet TAKE 1 TABLET BY MOUTH DAILY AT 6 PM 05/19/14   Pixie Casino, MD  benzonatate (TESSALON) 100 MG capsule Take 100 mg by mouth 3 (three) times daily as needed for cough.    Historical Provider, MD  enoxaparin (LOVENOX) 30 MG/0.3ML injection  Inject 0.3 mLs (30 mg total) into the skin every 12 (twelve) hours. 05/29/14   Chriss Czar, PA-C  EPINEPHrine (EPIPEN 2-PAK) 0.3 mg/0.3 mL DEVI Inject 0.3 mLs (0.3 mg total) into the muscle once as needed (for severe allergic reaction). CAll 911 immediately if you have to use this medicine 11/26/12   Baron Sane, PA-C  gabapentin (NEURONTIN) 100 MG capsule Take 100 mg by mouth at bedtime.    Historical Provider, MD  guaiFENesin (ROBITUSSIN) 100 MG/5ML liquid Take 100 mg by mouth 3 (three) times daily as needed for cough or congestion.    Historical Provider, MD  LORazepam (ATIVAN) 1 MG tablet Take 1 mg by mouth daily as needed for anxiety.    Historical Provider, MD  losartan-hydrochlorothiazide (HYZAAR) 100-12.5 MG per tablet Take 1 tablet by mouth daily.    Historical Provider, MD  metoprolol tartrate (LOPRESSOR) 25 MG tablet Take 0.5 tablets (12.5 mg total) by mouth 2 (two) times daily. 04/13/14   Pixie Casino, MD  nitroGLYCERIN (NITROSTAT) 0.4 MG SL tablet Place 1 tablet (0.4 mg total) under the tongue every 5 (five) minutes x 3 doses as needed for chest pain. 04/01/13   Erlene Quan, PA-C  OVER THE COUNTER MEDICATION Take 1 tablet by mouth daily. Herbal Life - Dietary Supplement    Historical Provider, MD  oxyCODONE-acetaminophen (ROXICET) 5-325 MG per tablet Take 1-2 tablets by mouth every 4 (four) hours as needed for severe pain. 05/29/14   Joshua Chadwell, PA-C   BP 141/77 mmHg  Pulse 94  Temp(Src) 98.7 F (37.1 C) (Oral)  Resp 20  Ht 5\' 8"  (1.727 m)  Wt 220 lb (99.791 kg)  BMI 33.46 kg/m2  SpO2 95% Physical Exam  Constitutional: She appears well-developed and well-nourished. No distress.  HENT:  Head: Normocephalic and atraumatic.  Mouth/Throat: Oropharynx is clear and moist. No oropharyngeal exudate.  Eyes: Conjunctivae are normal.  Neck: Normal range of motion. Neck supple.  Cardiovascular: Normal rate, regular rhythm and intact distal pulses.   Pulmonary/Chest:  Effort normal. No respiratory distress. She has no decreased breath sounds. She has wheezes in the right middle field, the right lower field, the left middle field and the left lower field. She has no rhonchi. She has no rales. She exhibits no tenderness.  Abdominal: Soft. There is no tenderness.  Musculoskeletal: She exhibits no tenderness.  Lymphadenopathy:    She has no cervical adenopathy.  Neurological: She is alert.  Skin: Skin is warm and dry. No rash noted. She is not diaphoretic.  Psychiatric: She has a normal mood and affect.  Nursing note and vitals reviewed.   ED Course  Procedures (including critical care time) Labs  Review Labs Reviewed - No data to display  Imaging Review Dg Chest 2 View  10/17/2014   CLINICAL DATA:  Cough, body aches  EXAM: CHEST  2 VIEW  COMPARISON:  05/18/2014  FINDINGS: The heart size and mediastinal contours are within normal limits. Both lungs are clear. The visualized skeletal structures are unremarkable.  IMPRESSION: No active cardiopulmonary disease.   Electronically Signed   By: Kathreen Devoid   On: 10/17/2014 19:01     EKG Interpretation None      MDM   Final diagnoses:  Bronchitis   64 yo with symptoms consistent with bronchitis.  Pt CXR negative for acute infiltrate. Due to her history of COPD will treat with course of abx. Her lung exam improved after neb treatment.  Pt will be discharged with symptomatic treatment and prescription for albuterol inhaler provided.  Pt is well-appearing, in no acute distress and vital signs reviewed and not concerning. She appears safe to be discharged.  Discharge include follow-up with their PCP.  Return precautions provided. Verbalizes understanding and is agreeable with plan.    Filed Vitals:   10/17/14 2015 10/17/14 2030 10/17/14 2120 10/17/14 2143  BP: 142/63 133/66  144/81  Pulse: 90 91  97  Temp:   99.8 F (37.7 C) 99 F (37.2 C)  TempSrc:   Oral Oral  Resp: 16   16  Height:      Weight:       SpO2: 97% 100%  93%   Meds given in ED:  Medications  albuterol (PROVENTIL) (2.5 MG/3ML) 0.083% nebulizer solution 5 mg (5 mg Nebulization Given 10/17/14 2011)  ipratropium (ATROVENT) nebulizer solution 0.5 mg (0.5 mg Nebulization Given 10/17/14 2011)  acetaminophen (TYLENOL) tablet 650 mg (650 mg Oral Given 10/17/14 2011)  albuterol (PROVENTIL HFA;VENTOLIN HFA) 108 (90 BASE) MCG/ACT inhaler 2 puff (2 puffs Inhalation Given 10/17/14 2143)    Discharge Medication List as of 10/17/2014  9:38 PM    START taking these medications   Details  Acetaminophen-Guaifenesin 325-200 MG TABS Take 2 tablets by mouth 3 (three) times daily., Starting 10/17/2014, Until Discontinued, Print    azithromycin (ZITHROMAX) 250 MG tablet Take 1 tablet (250 mg total) by mouth daily. Take first 2 tablets together, then 1 every day until finished., Starting 10/17/2014, Until Discontinued, Print           Britt Bottom, NP 10/18/14 1607  Tanna Furry, MD 10/30/14 [id number]

## 2014-10-17 NOTE — Discharge Instructions (Signed)
Please follow the directions provided.  Be sure to follow-up with your primary care provider to ensure you are getting better.  Please use the inhaler 2 puffs every 4 hours.  Take the antibiotic as directed until it is all gone.  Take the multi-symptom medicine to help with aches and cough.  Don't hesitate to return for any new, worsening or concerning symptoms.      SEEK IMMEDIATE MEDICAL CARE IF:  Your usual medicines do not stop your wheezing.  You have increased coughing or shortness of breath or both.  You have increased difficulty breathing.  You have any problems from the medicine you are taking, such as a rash, itching, swelling, or trouble breathing.

## 2014-10-17 NOTE — ED Notes (Signed)
Pt reports not feeling well x 2 days. Having headache, bodyaches, fever/chills, and productive cough. Denies vomiting or diarrhea. Mask on pt at triage.

## 2015-03-27 ENCOUNTER — Encounter (HOSPITAL_COMMUNITY): Payer: Self-pay | Admitting: Nurse Practitioner

## 2015-03-27 ENCOUNTER — Emergency Department (HOSPITAL_COMMUNITY)
Admission: EM | Admit: 2015-03-27 | Discharge: 2015-03-27 | Disposition: A | Discharge status: Home / Self Care | Payer: Medicare HMO | Attending: Emergency Medicine | Admitting: Emergency Medicine

## 2015-03-27 ENCOUNTER — Emergency Department (HOSPITAL_COMMUNITY): Payer: Medicare HMO

## 2015-03-27 DIAGNOSIS — F329 Major depressive disorder, single episode, unspecified: Secondary | ICD-10-CM | POA: Insufficient documentation

## 2015-03-27 DIAGNOSIS — I252 Old myocardial infarction: Secondary | ICD-10-CM | POA: Diagnosis not present

## 2015-03-27 DIAGNOSIS — Z8701 Personal history of pneumonia (recurrent): Secondary | ICD-10-CM | POA: Diagnosis not present

## 2015-03-27 DIAGNOSIS — I10 Essential (primary) hypertension: Secondary | ICD-10-CM | POA: Diagnosis not present

## 2015-03-27 DIAGNOSIS — F419 Anxiety disorder, unspecified: Secondary | ICD-10-CM | POA: Diagnosis not present

## 2015-03-27 DIAGNOSIS — Z79899 Other long term (current) drug therapy: Secondary | ICD-10-CM | POA: Diagnosis not present

## 2015-03-27 DIAGNOSIS — Z720 Tobacco use: Secondary | ICD-10-CM | POA: Insufficient documentation

## 2015-03-27 DIAGNOSIS — I2510 Atherosclerotic heart disease of native coronary artery without angina pectoris: Secondary | ICD-10-CM | POA: Diagnosis not present

## 2015-03-27 DIAGNOSIS — J441 Chronic obstructive pulmonary disease with (acute) exacerbation: Secondary | ICD-10-CM | POA: Diagnosis not present

## 2015-03-27 DIAGNOSIS — R05 Cough: Secondary | ICD-10-CM | POA: Diagnosis present

## 2015-03-27 LAB — BASIC METABOLIC PANEL
Anion gap: 10 (ref 5–15)
BUN: 6 mg/dL (ref 6–20)
CHLORIDE: 99 mmol/L — AB (ref 101–111)
CO2: 28 mmol/L (ref 22–32)
Calcium: 9.2 mg/dL (ref 8.9–10.3)
Creatinine, Ser: 0.73 mg/dL (ref 0.44–1.00)
GFR calc Af Amer: >60 mL/min (ref >60)
GFR calc non Af Amer: >60 mL/min (ref >60)
Glucose, Bld: 93 mg/dL (ref 65–99)
POTASSIUM: 3.5 mmol/L (ref 3.5–5.1)
SODIUM: 137 mmol/L (ref 135–145)

## 2015-03-27 LAB — CBC WITH DIFFERENTIAL/PLATELET
Basophils Absolute: 0 10*3/uL (ref 0.0–0.1)
Basophils Relative: 1 %
EOS PCT: 3 %
Eosinophils Absolute: 0.2 10*3/uL (ref 0.0–0.7)
HCT: 46 % (ref 36.0–46.0)
HEMOGLOBIN: 15.5 g/dL — AB (ref 12.0–15.0)
LYMPHS ABS: 1.5 10*3/uL (ref 0.7–4.0)
LYMPHS PCT: 25 %
MCH: 31.8 pg (ref 26.0–34.0)
MCHC: 33.7 g/dL (ref 30.0–36.0)
MCV: 94.5 fL (ref 78.0–100.0)
MONOS PCT: 15 %
Monocytes Absolute: 0.8 10*3/uL (ref 0.1–1.0)
NEUTROS PCT: 56 %
Neutro Abs: 3.3 10*3/uL (ref 1.7–7.7)
Platelets: 229 10*3/uL (ref 150–400)
RBC: 4.87 MIL/uL (ref 3.87–5.11)
RDW: 13.7 % (ref 11.5–15.5)
WBC: 5.7 10*3/uL (ref 4.0–10.5)

## 2015-03-27 MED ORDER — PREDNISONE 20 MG PO TABS: 40.0000 mg | ORAL_TABLET | Freq: Every day | ORAL | Status: DC

## 2015-03-27 MED ORDER — ALBUTEROL SULFATE (2.5 MG/3ML) 0.083% IN NEBU: 5.0000 mg | INHALATION_SOLUTION | Freq: Once | RESPIRATORY_TRACT | Status: DC

## 2015-03-27 MED ORDER — PREDNISONE 20 MG PO TABS
60.0000 mg | ORAL_TABLET | Freq: Once | ORAL | Status: AC
Start: 2015-03-27 — End: 2015-03-27
  Administered 2015-03-27: 60 mg via ORAL
  Filled 2015-03-27: qty 3

## 2015-03-27 MED ORDER — IPRATROPIUM-ALBUTEROL 0.5-2.5 (3) MG/3ML IN SOLN
3.0000 mL | RESPIRATORY_TRACT | Status: DC | PRN
  Administered 2015-03-27: 3 mL via RESPIRATORY_TRACT
  Filled 2015-03-27: qty 3

## 2015-03-27 MED ORDER — ALBUTEROL SULFATE HFA 108 (90 BASE) MCG/ACT IN AERS: 2.0000 | INHALATION_SPRAY | RESPIRATORY_TRACT | Status: DC | PRN

## 2015-03-27 MED ORDER — ALBUTEROL SULFATE HFA 108 (90 BASE) MCG/ACT IN AERS
2.0000 | INHALATION_SPRAY | Freq: Once | RESPIRATORY_TRACT | Status: AC
  Administered 2015-03-27: 2 via RESPIRATORY_TRACT
  Filled 2015-03-27: qty 6.7

## 2015-03-27 NOTE — ED Notes (Signed)
She c/o cough with clear thick sputum, feeling tightness in her chest when she coughs onset after cleaning dusty blinds with [employer] cleaner yesterday. She denies pain, fevers. She is alert and speaking in full, unlabored sentences now.

## 2015-03-27 NOTE — ED Provider Notes (Signed)
CSN: [id number]     Arrival date & time 03/27/15  1827 History   First MD Initiated Contact with Patient 03/27/15 1905     Chief Complaint  Patient presents with  . Cough     (Consider location/radiation/quality/duration/timing/severity/associated sxs/prior Treatment) HPI  64 year old female with a history of COPD presents with worsening dyspnea, cough, and congestion since yesterday. For about one week has had cough and congestion without fever. Yesterday she was cleaning her house and it was very dusty. She states while she was cleaning she developed increased shortness of breath, chest tightness, and cough. Has clear sputum. Feels like prior COPD problems. No leg swelling or leg pain. This has happened to her before whenever she has been around dust. Denies any pain just a tightness sensation in her chest. No headaches.  Past Medical History  Diagnosis Date  . Lumbar herniated disc   . Sciatica   . Hypertension   . Small bowel obstruction ([place])   . COPD (chronic obstructive pulmonary disease) ([place])   . Depression   . Anxiety   . Bronchitis     Hx: of  . Pneumonia     Hx: of  . Fibromyalgia   . Myocardial infarction [facility]) 2014    followed by Dr. Debara Pickett, treated medically, no stents  . Asthma     seasonal allergies   . Arthritis     hip, lumbar spine   . Coronary artery disease    Past Surgical History  Procedure Laterality Date  . Abdominal surgery      resection of small intestine  . Abdominal hysterectomy    . Tonsillectomy    . Dilation and curettage of uterus    . Colonoscopy      Hx; of  . Colon surgery      resection for bowel - for obstruction   . Back surgery      fusion  . Cardiac catheterization  2014  . Hernia repair  1970's    inguinal hernia   . Salivary gland surgery Left 1970-1980    approached from inside mouth & side of neck  . Total hip arthroplasty Left 05/28/2014    Procedure: LEFT TOTAL HIP ARTHROPLASTY;  Surgeon: Yvette Rack., MD;   Location: [place];  Service: Orthopedics;  Laterality: Left;  . Total hip arthroplasty Left 05/28/2014    dr caffrey  . Left heart catheterization with coronary angiogram N/A 03/30/2013    Procedure: LEFT HEART CATHETERIZATION WITH CORONARY ANGIOGRAM;  Surgeon: Leonie Man, MD;  Location: [facility] CATH LAB;  Service: Cardiovascular;  Laterality: N/A;   Family History  Problem Relation Age of Onset  . Hypertension Mother   . Diabetes Mother   . Cancer - Other Mother   . Cancer Sister    Social History  Substance Use Topics  . Smoking status: Current Every Day Smoker -- 0.40 packs/day for 25 years    Types: Cigarettes  . Smokeless tobacco: Never Used     Comment: Currently on the Nicoderm patch."smokes a few"  . Alcohol Use: Yes     Comment: 1 glass of wine occas. (1 every 2 weeks or so)   OB History    No data available     Review of Systems  Constitutional: Negative for fever.  HENT: Positive for congestion.   Respiratory: Positive for cough, chest tightness and shortness of breath.   Cardiovascular: Negative for chest pain.  Gastrointestinal: Negative for vomiting.  Neurological: Negative for headaches.  All other systems reviewed and are negative.     Allergies  Diclofenac; Codeine; and Hydrocodone  Home Medications   Prior to Admission medications   Medication Sig Start Date End Date Taking? Authorizing Provider  Acetaminophen-Guaifenesin 325-200 MG TABS Take 2 tablets by mouth 3 (three) times daily. 10/17/14   Britt Bottom, NP  albuterol (PROVENTIL HFA;VENTOLIN HFA) 108 (90 BASE) MCG/ACT inhaler Inhale 1-2 puffs into the lungs every 6 (six) hours as needed for wheezing or shortness of breath. 05/11/12   Harden Mo, MD  atorvastatin (LIPITOR) 40 MG tablet TAKE 1 TABLET BY MOUTH DAILY AT 6 PM 05/19/14   Pixie Casino, MD  azithromycin (ZITHROMAX) 250 MG tablet Take 1 tablet (250 mg total) by mouth daily. Take first 2 tablets together, then 1 every day until  finished. 10/17/14   Britt Bottom, NP  benzonatate (TESSALON) 100 MG capsule Take 100 mg by mouth 3 (three) times daily as needed for cough.    Historical Provider, MD  enoxaparin (LOVENOX) 30 MG/0.3ML injection Inject 0.3 mLs (30 mg total) into the skin every 12 (twelve) hours. Patient not taking: Reported on 10/17/2014 05/29/14   Chriss Czar, PA-C  EPINEPHrine (EPIPEN 2-PAK) 0.3 mg/0.3 mL DEVI Inject 0.3 mLs (0.3 mg total) into the muscle once as needed (for severe allergic reaction). CAll 911 immediately if you have to use this medicine 11/26/12   Baron Sane, PA-C  gabapentin (NEURONTIN) 100 MG capsule Take 100 mg by mouth at bedtime.    Historical Provider, MD  LORazepam (ATIVAN) 1 MG tablet Take 1 mg by mouth daily as needed for anxiety.    Historical Provider, MD  losartan-hydrochlorothiazide (HYZAAR) 100-12.5 MG per tablet Take 1 tablet by mouth daily.    Historical Provider, MD  metoprolol tartrate (LOPRESSOR) 25 MG tablet Take 0.5 tablets (12.5 mg total) by mouth 2 (two) times daily. 04/13/14   Pixie Casino, MD  nitroGLYCERIN (NITROSTAT) 0.4 MG SL tablet Place 1 tablet (0.4 mg total) under the tongue every 5 (five) minutes x 3 doses as needed for chest pain. 04/01/13   Erlene Quan, PA-C  OVER THE COUNTER MEDICATION Take 1 tablet by mouth daily. Herbal Life - Dietary Supplement    Historical Provider, MD  oxyCODONE-acetaminophen (ROXICET) 5-325 MG per tablet Take 1-2 tablets by mouth every 4 (four) hours as needed for severe pain. 05/29/14   Joshua Chadwell, PA-C   BP 129/67 mmHg  Pulse 80  Temp(Src) 99.7 F (37.6 C) (Oral)  Resp 18  SpO2 100% Physical Exam  Constitutional: She is oriented to person, place, and time. She appears well-developed and well-nourished.  HENT:  Head: Normocephalic and atraumatic.  Right Ear: External ear normal.  Left Ear: External ear normal.  Nose: Nose normal.  Eyes: Right eye exhibits no discharge. Left eye exhibits no discharge.   Cardiovascular: Normal rate, regular rhythm and normal heart sounds.   Pulmonary/Chest: No accessory muscle usage. Tachypnea (mild) noted. No respiratory distress. She has wheezes (diffuse, expiratory).  Abdominal: Soft. There is no tenderness.  Neurological: She is alert and oriented to person, place, and time.  Skin: Skin is warm and dry.  Nursing note and vitals reviewed.   ED Course  Procedures (including critical care time) Labs Review Labs Reviewed  BASIC METABOLIC PANEL - Abnormal; Notable for the following:    Chloride 99 (*)    All other components within normal limits  CBC WITH DIFFERENTIAL/PLATELET - Abnormal; Notable for the following:    Hemoglobin  15.5 (*)    All other components within normal limits    Imaging Review Dg Chest 2 View  03/27/2015   CLINICAL DATA:  Cough, clear thick sputum  EXAM: CHEST  2 VIEW  COMPARISON:  10/17/2014  FINDINGS: There is chronic bilateral interstitial prominence. There is no focal parenchymal opacity. There is no pleural effusion or pneumothorax. The heart and mediastinal contours are unremarkable. There is mild thoracic aortic atherosclerosis.  The osseous structures are unremarkable.  IMPRESSION: No active cardiopulmonary disease.   Electronically Signed   By: Kathreen Devoid   On: 03/27/2015 20:05   I have personally reviewed and evaluated these images and lab results as part of my medical decision-making.   EKG Interpretation   Date/Time:  Sunday March 27 2015 18:59:53 EDT Ventricular Rate:  83 PR Interval:  190 QRS Duration: 75 QT Interval:  363 QTC Calculation: 426 R Axis:   84 Text Interpretation:  Sinus rhythm Probable left atrial enlargement  Borderline right axis deviation Artifact in lead(s) I III aVR aVL aVF no  significant change since 2015 Confirmed by Myanna Ziesmer  MD, Gigi Onstad ([id number]) on  03/27/2015 7:05:20 PM      MDM   Final diagnoses:  COPD exacerbation ([place])    Patient appears to have a mild-moderate COPD  exacerbation. Speaks in full sentences with mild tachypnea. Patient symptoms are relieved with 1 DuoNeb. Given steroid, will discharge on a steroid burst and refill her albuterol inhaler. No signs of pneumonia. Recommend PCP follow-up this week and discussed strict return precautions.    Sherwood Gambler, MD 03/27/15 2204

## 2015-03-28 ENCOUNTER — Ambulatory Visit: Payer: Medicare HMO | Admitting: Internal Medicine

## 2015-04-14 ENCOUNTER — Ambulatory Visit: Payer: Medicare HMO | Admitting: Internal Medicine

## 2015-05-04 ENCOUNTER — Encounter: Payer: Self-pay | Admitting: Internal Medicine

## 2015-05-07 ENCOUNTER — Other Ambulatory Visit: Payer: Self-pay | Admitting: Internal Medicine

## 2015-05-11 ENCOUNTER — Other Ambulatory Visit: Payer: Self-pay | Admitting: Internal Medicine

## 2015-05-13 ENCOUNTER — Telehealth: Payer: Self-pay | Admitting: Internal Medicine

## 2015-05-13 NOTE — Telephone Encounter (Signed)
Mrs. Ortlieb is returning your call .Marland Kitchen Please call .Marland Kitchen Thanks

## 2015-05-13 NOTE — Telephone Encounter (Signed)
°*  STAT* If patient is at the pharmacy, call can be transferred to refill team.   1. Which medications need to be refilled? (please list name of each medication and dose if known) Amlodipine   2. Which pharmacy/location (including street and city if local pharmacy) is medication to be sent to? [facility] on [address]   3. Do they need a 30 day or 90 day supply? [place]

## 2015-05-13 NOTE — Telephone Encounter (Signed)
LMTCB

## 2015-05-13 NOTE — Telephone Encounter (Signed)
Spoke with Ms. Shreve. She states she has been taking amlodipine 5mg  despite this being listed as having been discontinued after her hospitalization Dec 2015.   She reports her BPs have been running 114s-120s/60s-70s.   She will monitor her BPs until her OV 06/2015  Will defer to MD to see if she should continue amlodipine 5mg ?

## 2015-05-13 NOTE — Telephone Encounter (Signed)
Called pt to inform her that I would have to send the request for a refill on Amlodipine 5 mg tablet to Dr. Lysbeth Penner nurse, Eliezer Lofts, RN, because this medication was D/C in the ED on 06/03/14. Pt has appointment on 07/05/15. Please advise

## 2015-05-16 ENCOUNTER — Telehealth: Payer: Self-pay | Admitting: Internal Medicine

## 2015-05-16 MED ORDER — AMLODIPINE BESYLATE 5 MG PO TABS: 5.0000 mg | ORAL_TABLET | Freq: Every day | ORAL | Status: DC

## 2015-05-16 NOTE — Telephone Encounter (Signed)
Returning your call. °

## 2015-05-16 NOTE — Telephone Encounter (Signed)
Spoke with patient regarding BP med being sent to pharmacy

## 2015-05-16 NOTE — Telephone Encounter (Signed)
LM for patient that Dr. Debara Pickett wanted patient to stay on amlodipine.  Rx(s) sent to pharmacy electronically.

## 2015-05-16 NOTE — Telephone Encounter (Signed)
She should continue it based on those BP readings.  DR. Lemmie Evens

## 2015-05-19 ENCOUNTER — Other Ambulatory Visit: Payer: Self-pay | Admitting: Internal Medicine

## 2015-05-23 ENCOUNTER — Other Ambulatory Visit (HOSPITAL_COMMUNITY): Payer: Self-pay | Admitting: Internal Medicine

## 2015-05-23 NOTE — Telephone Encounter (Signed)
Rx(s) sent to pharmacy electronically.  

## 2015-06-10 ENCOUNTER — Ambulatory Visit: Payer: Medicare HMO | Admitting: Internal Medicine

## 2015-07-05 ENCOUNTER — Ambulatory Visit: Payer: Medicare HMO | Admitting: Internal Medicine

## 2015-07-18 ENCOUNTER — Other Ambulatory Visit: Payer: Self-pay

## 2015-07-18 DIAGNOSIS — Z1231 Encounter for screening mammogram for malignant neoplasm of breast: Secondary | ICD-10-CM

## 2015-07-23 ENCOUNTER — Other Ambulatory Visit: Payer: Self-pay | Admitting: Internal Medicine

## 2015-07-25 NOTE — Telephone Encounter (Signed)
REFILL 

## 2015-07-28 ENCOUNTER — Ambulatory Visit (INDEPENDENT_AMBULATORY_CARE_PROVIDER_SITE_OTHER): Payer: Medicare HMO | Admitting: Internal Medicine

## 2015-07-28 VITALS — BP 116/70 | HR 69 | Ht 68.25 in | Wt 246.1 lb

## 2015-07-28 DIAGNOSIS — I10 Essential (primary) hypertension: Secondary | ICD-10-CM

## 2015-07-28 DIAGNOSIS — I428 Other cardiomyopathies: Secondary | ICD-10-CM

## 2015-07-28 DIAGNOSIS — I429 Cardiomyopathy, unspecified: Secondary | ICD-10-CM | POA: Diagnosis not present

## 2015-07-28 DIAGNOSIS — F17200 Nicotine dependence, unspecified, uncomplicated: Secondary | ICD-10-CM

## 2015-07-28 DIAGNOSIS — E785 Hyperlipidemia, unspecified: Secondary | ICD-10-CM

## 2015-07-28 DIAGNOSIS — G478 Other sleep disorders: Secondary | ICD-10-CM

## 2015-07-28 DIAGNOSIS — I5181 Takotsubo syndrome: Secondary | ICD-10-CM

## 2015-07-28 DIAGNOSIS — Z720 Tobacco use: Secondary | ICD-10-CM

## 2015-07-28 DIAGNOSIS — R0683 Snoring: Secondary | ICD-10-CM | POA: Diagnosis not present

## 2015-07-28 DIAGNOSIS — R5383 Other fatigue: Secondary | ICD-10-CM

## 2015-07-28 NOTE — Patient Instructions (Signed)
Your physician has recommended that you have a sleep study. This test records several body functions during sleep, including: brain activity, eye movement, oxygen and carbon dioxide blood levels, heart rate and rhythm, breathing rate and rhythm, the flow of air through your mouth and nose, snoring, body muscle movements, and chest and belly movement.  Dr Debara Pickett recommends that you schedule a follow-up appointment in 1 year. You will receive a reminder letter in the mail two months in advance. If you don't receive a letter, please call our office to schedule the follow-up appointment.  If you need a refill on your cardiac medications before your next appointment, please call your pharmacy.

## 2015-07-29 ENCOUNTER — Telehealth: Payer: Self-pay | Admitting: *Deleted

## 2015-07-29 ENCOUNTER — Ambulatory Visit
Admission: RE | Admit: 2015-07-29 | Discharge: 2015-07-29 | Disposition: A | Discharge status: Home / Self Care | Payer: Medicare HMO | Source: Ambulatory Visit

## 2015-07-29 DIAGNOSIS — Z1231 Encounter for screening mammogram for malignant neoplasm of breast: Secondary | ICD-10-CM

## 2015-07-29 NOTE — Telephone Encounter (Signed)
Spoke with patient regarding Sleep Study that was ordered by Dr. Gillermina Phy for Sunday 08/28/15 at 8:00 pm.  [facility] will [facility] to [facility] that includes appointment date and time along with instructions.  Patient was also given phone number [phone] if rescheduling is necessary.  Patient voiced her understanding. :

## 2015-07-31 ENCOUNTER — Encounter: Payer: Self-pay | Admitting: Internal Medicine

## 2015-07-31 NOTE — Progress Notes (Signed)
OFFICE NOTE  Chief Complaint:  Follow-up, no complaints  Primary Care Physician: Maggie Font, MD  HPI:  Morgan SASNETT is a 65 y.o. female smoker with HTN admitted 03/29/13 with [place] and later ruled in for a NSTEMI with a Troponin peak of 1.5. Cath done 03/30/13 revealed angiographically only mild to moderate CAD, with no obvious "Culprit Lesion" for NSTEMI. She also had bnormal LV Gram findings with WMA suggestive of Takotsubo cardiomyopathy. Echo revealed an EF of 55% with no obvious WMA. The plan is for medical Rx. She has been counseled on smoking cessation. It should be noted that she also developed post NSTEMI anterior TWI on EKG. She followed up with [name], PA-C a few months ago and reported doing good. She has not smoked since being discharged. She had some mild CP with day after discharge but none since. She currently denies nausea, vomiting, fever, shortness of breath, orthopnea, dizziness, PND, cough, congestion, abdominal pain, hematochezia, melena, lower extremity edema.  Mrs. Kosty returns today. She is feeling well from a cardiac standpoint. Recently she has been having problems with her hip and underwent an injection. There is a possibility of surgery however she is not surprisingly not interested in that. Blood pressure is well-controlled. She denies any chest pain or worsening shortness of breath. She says she is working on smoking cessation. She is unable to exercise much due to her hip pain.  I saw Mrs. Cressy back today in the office. She denies any cardiac sounding chest pain. She reports she is still having problems with fatigue, insominia and general daytime sleepiness.  PMHx:  Past Medical History  Diagnosis Date  . Lumbar herniated disc   . Sciatica   . Hypertension   . Small bowel obstruction ([place])   . COPD (chronic obstructive pulmonary disease) ([place])   . Depression   . Anxiety   . Bronchitis     Hx: of  . Pneumonia     Hx: of  . Fibromyalgia     . Myocardial infarction [facility]) 2014    followed by Dr. Debara Pickett, treated medically, no stents  . Asthma     seasonal allergies   . Arthritis     hip, lumbar spine   . Coronary artery disease     Past Surgical History  Procedure Laterality Date  . Abdominal surgery      resection of small intestine  . Abdominal hysterectomy    . Tonsillectomy    . Dilation and curettage of uterus    . Colonoscopy      Hx; of  . Colon surgery      resection for bowel - for obstruction   . Back surgery      fusion  . Cardiac catheterization  2014  . Hernia repair  1970's    inguinal hernia   . Salivary gland surgery Left 1970-1980    approached from inside mouth & side of neck  . Total hip arthroplasty Left 05/28/2014    Procedure: LEFT TOTAL HIP ARTHROPLASTY;  Surgeon: Yvette Rack., MD;  Location: [place];  Service: Orthopedics;  Laterality: Left;  . Total hip arthroplasty Left 05/28/2014    dr caffrey  . Left heart catheterization with coronary angiogram N/A 03/30/2013    Procedure: LEFT HEART CATHETERIZATION WITH CORONARY ANGIOGRAM;  Surgeon: Leonie Man, MD;  Location: [facility] CATH LAB;  Service: Cardiovascular;  Laterality: N/A;    FAMHx:  Family History  Problem Relation Age of  Onset  . Hypertension Mother   . Diabetes Mother   . Cancer - Other Mother   . Cancer Sister     SOCHx:   reports that she has been smoking Cigarettes.  She has a 10 pack-year smoking history. She has never used smokeless tobacco. She reports that she drinks alcohol. She reports that she does not use illicit drugs.  ALLERGIES:  Allergies  Allergen Reactions  . Diclofenac Anaphylaxis  . Codeine Itching  . Hydrocodone Itching    ROS: A comprehensive review of systems was negative except for: Constitutional: positive for sleepiness, fatigue  HOME MEDS: Current Outpatient Prescriptions  Medication Sig Dispense Refill  . Acetaminophen-Guaifenesin 325-200 MG TABS Take 2 tablets by mouth 3 (three) times  daily. 60 each 0  . albuterol (PROVENTIL HFA;VENTOLIN HFA) 108 (90 BASE) MCG/ACT inhaler Inhale 2 puffs into the lungs every 4 (four) hours as needed for wheezing or shortness of breath. 1 Inhaler 0  . amLODipine (NORVASC) 5 MG tablet Take 1 tablet (5 mg total) by mouth daily. 30 tablet 3  . atorvastatin (LIPITOR) 40 MG tablet TAKE 1 TABLET BY MOUTH DAILY AT 6 PM 90 tablet 2  . azithromycin (ZITHROMAX) 250 MG tablet Take 1 tablet (250 mg total) by mouth daily. Take first 2 tablets together, then 1 every day until finished. 6 tablet 0  . benzonatate (TESSALON) 100 MG capsule Take 100 mg by mouth 3 (three) times daily as needed for cough.    . EPINEPHrine (EPIPEN 2-PAK) 0.3 mg/0.3 mL DEVI Inject 0.3 mLs (0.3 mg total) into the muscle once as needed (for severe allergic reaction). CAll 911 immediately if you have to use this medicine 1 Device 1  . gabapentin (NEURONTIN) 100 MG capsule Take 100 mg by mouth at bedtime.    Marland Kitchen LORazepam (ATIVAN) 1 MG tablet Take 1 mg by mouth daily as needed for anxiety.    Marland Kitchen losartan-hydrochlorothiazide (HYZAAR) 100-12.5 MG per tablet Take 1 tablet by mouth daily.    . meloxicam (MOBIC) 15 MG tablet Take 1 tablet by mouth as needed.    . metoprolol tartrate (LOPRESSOR) 25 MG tablet TAKE 0.5 TABLETS (12.5 MG TOTAL) BY MOUTH 2 (TWO) TIMES DAILY. 30 tablet 0  . nitroGLYCERIN (NITROSTAT) 0.4 MG SL tablet Place 1 tablet (0.4 mg total) under the tongue every 5 (five) minutes x 3 doses as needed for chest pain. 25 tablet 2  . OVER THE COUNTER MEDICATION Take 1 tablet by mouth daily. Herbal Life - Dietary Supplement    . oxyCODONE-acetaminophen (ROXICET) 5-325 MG per tablet Take 1-2 tablets by mouth every 4 (four) hours as needed for severe pain. 90 tablet 0  . predniSONE (DELTASONE) 20 MG tablet Take 2 tablets (40 mg total) by mouth daily. 8 tablet 0   No current facility-administered medications for this visit.    LABS/IMAGING: No results found for this or any previous visit  (from the past 48 hour(s)). No results found.  VITALS: BP 116/70 mmHg  Pulse 69  Ht 5' 8.25" (1.734 m)  Wt 246 lb 1.6 oz (111.63 kg)  BMI 37.13 kg/m2  EXAM: General appearance: alert and no distress Neck: no carotid bruit and no JVD Lungs: clear to auscultation bilaterally Heart: regular rate and rhythm, S1, S2 normal, no murmur, click, rub or gallop Abdomen: soft, non-tender; bowel sounds normal; no masses,  no organomegaly Extremities: extremities normal, atraumatic, no cyanosis or edema Pulses: 2+ and symmetric Skin: Skin color, texture, turgor normal. No rashes or  lesions Neurologic: Grossly normal Psych: Mood, affect normal  EKG: Normal sinus rhythm at 69  ASSESSMENT: 1. History of NSTEMI without any clear culprit lesion on cath 2. Possible Takatsubo cardiomyopathy 3. Dyslipidemia - followed by pcp 4. Hypertension - at goal 5. Tobacco abuse - working on quitting  PLAN: 1.   Mrs. Mckinstry is still reporting some fatigue and difficulty sleeping at night. I think she would benefit from a sleep study, there is concern for OSA. Her EPWSS score is >10. She is agreeable to this. Return to discuss results.  Pixie Casino, MD, [facility] Attending Cardiologist [place] 07/31/2015, 11:50 PM

## 2015-08-25 ENCOUNTER — Other Ambulatory Visit: Payer: Self-pay | Admitting: Internal Medicine

## 2015-08-25 NOTE — Telephone Encounter (Signed)
REFILL 

## 2015-08-28 ENCOUNTER — Ambulatory Visit (HOSPITAL_BASED_OUTPATIENT_CLINIC_OR_DEPARTMENT_OTHER): Payer: Medicare HMO | Attending: Internal Medicine

## 2015-08-28 VITALS — Ht 68.0 in | Wt 235.0 lb

## 2015-08-28 DIAGNOSIS — G478 Other sleep disorders: Secondary | ICD-10-CM | POA: Diagnosis not present

## 2015-08-28 DIAGNOSIS — Z79899 Other long term (current) drug therapy: Secondary | ICD-10-CM | POA: Diagnosis not present

## 2015-08-28 DIAGNOSIS — Z6836 Body mass index (BMI) 36.0-36.9, adult: Secondary | ICD-10-CM | POA: Diagnosis not present

## 2015-08-28 DIAGNOSIS — E669 Obesity, unspecified: Secondary | ICD-10-CM | POA: Diagnosis not present

## 2015-08-28 DIAGNOSIS — R5383 Other fatigue: Secondary | ICD-10-CM | POA: Diagnosis not present

## 2015-08-28 DIAGNOSIS — R0683 Snoring: Secondary | ICD-10-CM | POA: Diagnosis not present

## 2015-08-28 DIAGNOSIS — G4733 Obstructive sleep apnea (adult) (pediatric): Secondary | ICD-10-CM

## 2015-09-21 NOTE — Sleep Study (Signed)
Patient Name: Morgan Torres, Morgan Torres Date: 08/28/2015 Gender: Female D.O.B: 1950-09-10 Age (years): 64 Referring Provider: Nadean Corwin Hilty Height (inches): 68 Interpreting Physician: Shelva Majestic MD, ABSM Weight (lbs): 235 RPSGT: Jonna Coup BMI: 36 MRN: [id number] Neck Size: 15.00  CLINICAL INFORMATION Sleep Study Type: NPSG Indication for sleep study: Fatigue, Obesity, Snoring Epworth Sleepiness Score: 9  SLEEP STUDY TECHNIQUE As per the AASM Manual for the Scoring of Sleep and Associated Events v2.3 (April 2016) with a hypopnea requiring 4% desaturations. The channels recorded and monitored were frontal, central and occipital EEG, electrooculogram (EOG), submentalis EMG (chin), nasal and oral airflow, thoracic and abdominal wall motion, anterior tibialis EMG, snore microphone, electrocardiogram, and pulse oximetry.  MEDICATIONS  Acetaminophen-Guaifenesin 325-200 MG TABS 2 tablet, 3 times daily     albuterol (PROVENTIL HFA;VENTOLIN HFA) 108 (90 BASE) MCG/ACT inhaler 2 puff, Every 4 hours PRN     amLODipine (NORVASC) 5 MG tablet 5 mg, Daily     atorvastatin (LIPITOR) 40 MG tablet      azithromycin (ZITHROMAX) 250 MG tablet 250 mg, Daily     benzonatate (TESSALON) 100 MG capsule 100 mg, 3 times daily PRN     EPINEPHrine (EPIPEN 2-PAK) 0.3 mg/0.3 mL DEVI 0.3 mg, Once PRN     gabapentin (NEURONTIN) 100 MG capsule 100 mg, Daily at bedtime     Note: Patient takes for nerve pain (Written 03/29/2013 1621)   LORazepam (ATIVAN) 1 MG tablet 1 mg, Daily PRN     losartan-hydrochlorothiazide (HYZAAR) 100-12.5 MG per tablet 1 tablet, Daily     meloxicam (MOBIC) 15 MG tablet 1 tablet, As needed     Note: Received from: External Pharmacy Received Sig: (Written 07/28/2015 1639)   metoprolol tartrate (LOPRESSOR) 25 MG tablet      nitroGLYCERIN (NITROSTAT) 0.4 MG SL tablet 0.4 mg, Every 5 min x3 PRN     OVER THE COUNTER MEDICATION 1 tablet, Daily     oxyCODONE-acetaminophen (ROXICET)  5-325 MG per tablet 1-2 tablet, Every 4 hours PRN     predniSONE (DELTASONE) 20 MG tablet 40 mg, Daily   Medications self-administered by patient during sleep study : No sleep medicine administered.  SLEEP ARCHITECTURE The study was initiated at 10:04:23 PM and ended at 4:14:27 AM. Sleep onset time was 9.1 minutes and the sleep efficiency was 87.0%. The total sleep time was 321.9 minutes. Wake after sleep onset (WASO) was 39 minutes. Stage REM latency was 94.0 minutes. The patient spent 3.24% of the night in stage N1 sleep, 83.71% in stage N2 sleep, 0.00% in stage N3 and 13.05% in REM. Alpha intrusion was absent. Supine sleep was 16.43%.  RESPIRATORY PARAMETERS The overall apnea/hypopnea index (AHI) was 6.5 per hour. There were 3 total apneas, including 2 obstructive, 1 central and 0 mixed apneas. There were 32 hypopneas and 14 RERAs. The AHI during Stage REM sleep was 15.7 per hour. AHI while supine was 1.1 per hour. The mean oxygen saturation was 92.49%. The minimum SpO2 during sleep was 87.00%. Loud snoring was noted during this study.  CARDIAC DATA The 2 lead EKG demonstrated sinus rhythm. The mean heart rate was 39.98 beats per minute. Other EKG findings include: None.  LEG MOVEMENT DATA The total PLMS were 4 with a resulting PLMS index of 0.75. Associated arousal with leg movement index was 0.0 .  IMPRESSIONS - Mild obstructive sleep apnea overall (AHI = 6.5/h); but moderate during REM sleep (AHI 15.7/h). - No significant central sleep apnea occurred during this  study (CAI = 0.2/h). - Mild oxygen desaturation to a nadir of 87.00%. Percentage of time under 90% saturation was 14.3% - Loud snoring volume. - No cardiac abnormalities were noted during this study. - Clinically significant periodic limb movements did not occur during sleep. No significant associated arousals.  DIAGNOSIS - Obstructive Sleep Apnea (327.23 [G47.33 ICD-10])  RECOMMENDATIONS - In this patient with  significant symptoms and cardiovascular co-morbidities recommend a CPAP titration trial for treatment of the patient's sleep apnea. - Efforts should be made to optimize nasal and oropharyngeal patency. - Avoid alcohol, sedatives and other CNS depressants that may worsen sleep apnea and disrupt normal sleep architecture. - Sleep hygiene should be reviewed to assess factors that may improve sleep quality. - Weight management (BMI 36) and regular exercise should be initiated or continued if appropriate.   Troy Sine, MD, [place], American Board of Sleep Medicine  ELECTRONICALLY SIGNED ON:  09/21/2015, 7:45 AM [place] PH: [phone]   FX: [phone] [place]

## 2015-09-22 ENCOUNTER — Telehealth: Payer: Self-pay | Admitting: *Deleted

## 2015-09-22 NOTE — Telephone Encounter (Signed)
Left message to return a call to discuss sleep study results. 

## 2015-09-22 NOTE — Telephone Encounter (Signed)
-----   Message from Troy Sine, MD sent at 09/21/2015  7:59 AM EDT ----- Mariann Laster please schedule for CPAP titration study

## 2015-09-22 NOTE — Progress Notes (Signed)
Left message to discuss sleep study results

## 2015-09-25 ENCOUNTER — Other Ambulatory Visit: Payer: Self-pay | Admitting: Internal Medicine

## 2015-09-26 NOTE — Telephone Encounter (Signed)
Rx(s) sent to pharmacy electronically.  

## 2015-10-20 ENCOUNTER — Other Ambulatory Visit: Payer: Self-pay | Admitting: Neurosurgery

## 2015-10-20 DIAGNOSIS — M48061 Spinal stenosis, lumbar region without neurogenic claudication: Secondary | ICD-10-CM

## 2015-10-24 ENCOUNTER — Telehealth: Payer: Self-pay | Admitting: *Deleted

## 2015-10-24 NOTE — Telephone Encounter (Signed)
Patient states that she is a patient of Dr. Debara Pickett, and she is calling regarding her sleep study.  Message has ben forwarded to [facility].

## 2015-10-26 ENCOUNTER — Telehealth: Payer: Self-pay | Admitting: *Deleted

## 2015-10-26 ENCOUNTER — Other Ambulatory Visit: Payer: Self-pay | Admitting: *Deleted

## 2015-10-26 DIAGNOSIS — G4733 Obstructive sleep apnea (adult) (pediatric): Secondary | ICD-10-CM

## 2015-10-26 NOTE — Progress Notes (Signed)
Patient returned a call. Sleep study results and recommendations given to her. She voiced understanding. CPAP titration ordered.

## 2015-10-26 NOTE — Telephone Encounter (Signed)
-----   Message from Troy Sine, MD sent at 09/21/2015  7:59 AM EDT ----- Mariann Laster please schedule for CPAP titration study

## 2015-10-26 NOTE — Telephone Encounter (Signed)
Patient notified of sleep study results and recommendations. 

## 2015-11-01 ENCOUNTER — Other Ambulatory Visit: Payer: Self-pay | Admitting: Neurosurgery

## 2015-11-01 ENCOUNTER — Ambulatory Visit
Admission: RE | Admit: 2015-11-01 | Discharge: 2015-11-01 | Disposition: A | Discharge status: Home / Self Care | Payer: Medicare HMO | Source: Ambulatory Visit | Attending: Neurosurgery | Admitting: Neurosurgery

## 2015-11-01 DIAGNOSIS — M48061 Spinal stenosis, lumbar region without neurogenic claudication: Secondary | ICD-10-CM

## 2015-11-21 ENCOUNTER — Ambulatory Visit (HOSPITAL_BASED_OUTPATIENT_CLINIC_OR_DEPARTMENT_OTHER): Payer: Medicare HMO | Attending: Cardiovascular Disease | Admitting: Cardiovascular Disease

## 2015-11-21 VITALS — Ht 68.0 in | Wt 240.0 lb

## 2015-11-21 DIAGNOSIS — Z79899 Other long term (current) drug therapy: Secondary | ICD-10-CM | POA: Diagnosis not present

## 2015-11-21 DIAGNOSIS — G4730 Sleep apnea, unspecified: Secondary | ICD-10-CM

## 2015-11-21 DIAGNOSIS — G4731 Primary central sleep apnea: Secondary | ICD-10-CM | POA: Diagnosis not present

## 2015-11-21 DIAGNOSIS — G4733 Obstructive sleep apnea (adult) (pediatric): Secondary | ICD-10-CM

## 2015-11-23 NOTE — Procedures (Signed)
Patient Name: Reta, Daywalt Date: 11/21/2015 Gender: Female D.O.B: 09/24/1950 Age (years): 64 Referring Provider: Nadean Corwin Hilty Height (inches): 68 Interpreting Physician: Shelva Majestic MD, ABSM Weight (lbs): 240 RPSGT: Baxter Flattery BMI: 36 MRN: [id number] Neck Size: 15.00  CLINICAL INFORMATION The patient is referred for a CPAP titration to treat sleep apnea.   Date of NPSG, Split Night or HST: 08/28/2015  AHI 6.5/h and RDI 9.1/h overall; AHI during REM sleep 15.7/h  SLEEP STUDY TECHNIQUE As per the AASM Manual for the Scoring of Sleep and Associated Events v2.3 (April 2016) with a hypopnea requiring 4% desaturations. The channels recorded and monitored were frontal, central and occipital EEG, electrooculogram (EOG), submentalis EMG (chin), nasal and oral airflow, thoracic and abdominal wall motion, anterior tibialis EMG, snore microphone, electrocardiogram, and pulse oximetry. Continuous positive airway pressure (CPAP) was initiated at the beginning of the study and titrated to treat sleep-disordered breathing.  MEDICATIONS  Acetaminophen-Guaifenesin 325-200 MG TABS 2 tablet, 3 times daily     albuterol (PROVENTIL HFA;VENTOLIN HFA) 108 (90 BASE) MCG/ACT inhaler 2 puff, Every 4 hours PRN     amLODipine (NORVASC) 5 MG tablet 5 mg, Daily     atorvastatin (LIPITOR) 40 MG tablet      azithromycin (ZITHROMAX) 250 MG tablet 250 mg, Daily     benzonatate (TESSALON) 100 MG capsule 100 mg, 3 times daily PRN     EPINEPHrine (EPIPEN 2-PAK) 0.3 mg/0.3 mL DEVI 0.3 mg, Once PRN     gabapentin (NEURONTIN) 100 MG capsule 100 mg, Daily at bedtime     Note: Patient takes for nerve pain (Written 03/29/2013 1621)   LORazepam (ATIVAN) 1 MG tablet 1 mg, Daily PRN     losartan-hydrochlorothiazide (HYZAAR) 100-12.5 MG per tablet 1 tablet, Daily     meloxicam (MOBIC) 15 MG tablet 1 tablet, As needed     Note: Received from: External Pharmacy Received Sig: (Written 07/28/2015 1639)   metoprolol tartrate (LOPRESSOR) 25 MG tablet      nitroGLYCERIN (NITROSTAT) 0.4 MG SL tablet 0.4 mg, Every 5 min x3 PRN     OVER THE COUNTER MEDICATION 1 tablet, Daily     oxyCODONE-acetaminophen (ROXICET) 5-325 MG per tablet 1-2 tablet, Every 4 hours PRN     predniSON   Medications administered by patient during sleep study : No sleep medicine administered.  TECHNICIAN COMMENTS Comments added by technician: Patient had difficulty initiating sleep. Patient had difficulty maintaining sleep also  Comments added by scorer: N/A  RESPIRATORY PARAMETERS Optimal PAP Pressure (cm): 15 AHI at Optimal Pressure (/hr): 0.0 Overall Minimal O2 (%): 87.00 Supine % at Optimal Pressure (%): 0 Minimal O2 at Optimal Pressure (%): 92.0      SLEEP ARCHITECTURE The study was initiated at 9:39:50 PM and ended at 5:40:47 AM. Sleep onset time was 33.3 minutes and the sleep efficiency was 50.1%. The total sleep time was 241.2 minutes. Wake after sleep onset (WASO) was 206.5 minutes The patient spent 8.29% of the night in stage N1 sleep, 79.89% in stage N2 sleep, 3.94% in stage N3 and 7.88% in REM.Stage REM latency was 321.5 minutes Wake after sleep onset was 206.5. Alpha intrusion was absent. Supine sleep was 8.50%.  CARDIAC DATA The 2 lead EKG demonstrated sinus rhythm. The mean heart rate was 49.82 beats per minute. Other EKG findings include: None.  LEG MOVEMENT DATA The total Periodic Limb Movements of Sleep (PLMS) were 48. The PLMS index was 11.94. A PLMS index of <15 is  considered normal in adults.  IMPRESSIONS - The patient was initially titrated on CPAP to 11 cm and was changed to BiPAP due to central events which persisted up to 15/12; he was changed back to CPAP at 15 cm and tolerated this well with an AHI at 15 cm of 0.  - Moderate Central Sleep Apnea was noted during this titration (CAI = 23.4/h).  - Moderate oxygen desaturations were observed during this titration (min O2 = 87.00%). - No snoring  was audible during this study. - No cardiac abnormalities were observed during this study. - Mild periodic limb movements were observed during this study. Arousals associated with PLMs were rare.  DIAGNOSIS - Obstructive Sleep Apnea (327.23 [G47.33 ICD-10])  RECOMMENDATIONS - Recommend an initial trial of CPAP therapy on 15 cm H2O and heated humidification. A Medium size Fisher[employer]Paykel Full Face Mask Simplus mask was used for the titration.  - Avoid alcohol, sedatives and other CNS depressants that may worsen sleep apnea and disrupt normal sleep architecture. - Sleep hygiene should be reviewed to assess factors that may improve sleep quality. - Weight management (BMI 36) and regular exercise should be initiated or continued. - Recommend a download be obtained in 30 days and sleep clinic follow-up evaluation. Download should also assess for potential central apneic events.    Troy Sine, MD, [place], American Board of Sleep Medicine  ELECTRONICALLY SIGNED ON:  11/23/2015, 7:45 PM [place] PH: [phone]   FX: [phone] [place]

## 2015-12-15 ENCOUNTER — Emergency Department (HOSPITAL_COMMUNITY)
Admission: EM | Admit: 2015-12-15 | Discharge: 2015-12-16 | Disposition: A | Discharge status: Home / Self Care | Payer: Medicare HMO | Attending: Emergency Medicine | Admitting: Emergency Medicine

## 2015-12-15 ENCOUNTER — Emergency Department (HOSPITAL_COMMUNITY): Payer: Medicare HMO

## 2015-12-15 ENCOUNTER — Encounter (HOSPITAL_COMMUNITY): Payer: Self-pay | Admitting: Nurse Practitioner

## 2015-12-15 DIAGNOSIS — M545 Low back pain, unspecified: Secondary | ICD-10-CM

## 2015-12-15 DIAGNOSIS — M5442 Lumbago with sciatica, left side: Secondary | ICD-10-CM | POA: Insufficient documentation

## 2015-12-15 DIAGNOSIS — F17210 Nicotine dependence, cigarettes, uncomplicated: Secondary | ICD-10-CM | POA: Diagnosis not present

## 2015-12-15 DIAGNOSIS — I10 Essential (primary) hypertension: Secondary | ICD-10-CM | POA: Insufficient documentation

## 2015-12-15 DIAGNOSIS — J449 Chronic obstructive pulmonary disease, unspecified: Secondary | ICD-10-CM | POA: Diagnosis not present

## 2015-12-15 DIAGNOSIS — Z96642 Presence of left artificial hip joint: Secondary | ICD-10-CM | POA: Insufficient documentation

## 2015-12-15 DIAGNOSIS — I2510 Atherosclerotic heart disease of native coronary artery without angina pectoris: Secondary | ICD-10-CM | POA: Diagnosis not present

## 2015-12-15 DIAGNOSIS — I252 Old myocardial infarction: Secondary | ICD-10-CM | POA: Insufficient documentation

## 2015-12-15 DIAGNOSIS — Z92241 Personal history of systemic steroid therapy: Secondary | ICD-10-CM

## 2015-12-15 LAB — CBC WITH DIFFERENTIAL/PLATELET
BASOS PCT: 0 %
Basophils Absolute: 0 10*3/uL (ref 0.0–0.1)
Eosinophils Absolute: 0 10*3/uL (ref 0.0–0.7)
Eosinophils Relative: 0 %
HEMATOCRIT: 44.4 % (ref 36.0–46.0)
HEMOGLOBIN: 15.1 g/dL — AB (ref 12.0–15.0)
LYMPHS ABS: 2.1 10*3/uL (ref 0.7–4.0)
LYMPHS PCT: 16 %
MCH: 31.2 pg (ref 26.0–34.0)
MCHC: 34 g/dL (ref 30.0–36.0)
MCV: 91.7 fL (ref 78.0–100.0)
MONOS PCT: 3 %
Monocytes Absolute: 0.4 10*3/uL (ref 0.1–1.0)
NEUTROS ABS: 10.4 10*3/uL — AB (ref 1.7–7.7)
NEUTROS PCT: 81 %
Platelets: 310 10*3/uL (ref 150–400)
RBC: 4.84 MIL/uL (ref 3.87–5.11)
RDW: 13.2 % (ref 11.5–15.5)
WBC: 12.9 10*3/uL — ABNORMAL HIGH (ref 4.0–10.5)

## 2015-12-15 LAB — BASIC METABOLIC PANEL
Anion gap: 8 (ref 5–15)
BUN: 17 mg/dL (ref 6–20)
CALCIUM: 9.7 mg/dL (ref 8.9–10.3)
CO2: 26 mmol/L (ref 22–32)
Chloride: 98 mmol/L — ABNORMAL LOW (ref 101–111)
Creatinine, Ser: 0.75 mg/dL (ref 0.44–1.00)
GFR calc Af Amer: >60 mL/min (ref >60)
GFR calc non Af Amer: >60 mL/min (ref >60)
Glucose, Bld: 173 mg/dL — ABNORMAL HIGH (ref 65–99)
Potassium: 4.4 mmol/L (ref 3.5–5.1)
Sodium: 132 mmol/L — ABNORMAL LOW (ref 135–145)

## 2015-12-15 MED ORDER — ONDANSETRON HCL 4 MG/2ML IJ SOLN: 4.0000 mg | Freq: Once | INTRAMUSCULAR | Status: DC

## 2015-12-15 MED ORDER — LIDOCAINE 5 % EX PTCH
1.0000 | MEDICATED_PATCH | CUTANEOUS | Status: DC
  Administered 2015-12-15: 1 via TRANSDERMAL
  Filled 2015-12-15: qty 1

## 2015-12-15 MED ORDER — ONDANSETRON 4 MG PO TBDP: 4.0000 mg | ORAL_TABLET | Freq: Three times a day (TID) | ORAL | Status: DC | PRN

## 2015-12-15 MED ORDER — HYDROMORPHONE HCL 1 MG/ML IJ SOLN
1.0000 mg | Freq: Once | INTRAMUSCULAR | Status: AC
  Administered 2015-12-15: 1 mg via INTRAVENOUS
  Filled 2015-12-15: qty 1

## 2015-12-15 MED ORDER — ONDANSETRON 4 MG PO TBDP
4.0000 mg | ORAL_TABLET | Freq: Once | ORAL | Status: AC
  Administered 2015-12-15: 4 mg via ORAL
  Filled 2015-12-15: qty 1

## 2015-12-15 MED ORDER — ONDANSETRON HCL 4 MG/2ML IJ SOLN
4.0000 mg | Freq: Once | INTRAMUSCULAR | Status: AC
  Administered 2015-12-15: 4 mg via INTRAVENOUS
  Filled 2015-12-15: qty 2

## 2015-12-15 MED ORDER — GABAPENTIN 300 MG PO CAPS
300.0000 mg | ORAL_CAPSULE | Freq: Once | ORAL | Status: AC
  Administered 2015-12-15: 300 mg via ORAL
  Filled 2015-12-15: qty 1

## 2015-12-15 MED ORDER — DIAZEPAM 5 MG PO TABS
5.0000 mg | ORAL_TABLET | Freq: Once | ORAL | Status: AC
  Administered 2015-12-15: 5 mg via ORAL
  Filled 2015-12-15: qty 1

## 2015-12-15 MED ORDER — OXYCODONE-ACETAMINOPHEN 5-325 MG PO TABS: 2.0000 | ORAL_TABLET | Freq: Once | ORAL | Status: DC

## 2015-12-15 MED ORDER — OXYCODONE-ACETAMINOPHEN 5-325 MG PO TABS: 1.0000 | ORAL_TABLET | ORAL | Status: DC | PRN

## 2015-12-15 MED ORDER — PREDNISONE 20 MG PO TABS
60.0000 mg | ORAL_TABLET | Freq: Once | ORAL | Status: AC
  Administered 2015-12-15: 60 mg via ORAL
  Filled 2015-12-15: qty 3

## 2015-12-15 NOTE — ED Notes (Signed)
Pt brought back from MRI for labs to be drawn

## 2015-12-15 NOTE — ED Notes (Signed)
Pt c/o lower back pain x's 1 week.  Pt now vomiting due to pain.

## 2015-12-15 NOTE — ED Notes (Signed)
Pt c/o lower back pain for past weeks. She was treated with steroid injection for sciatica last week by neurosurgery and started on prednisone and tramadol by PCP but pain is getting worse. She denies bowel/bladder changes, fevers. She is ambulatory with cane. She is crying and states the pain is making her nasueated

## 2015-12-15 NOTE — ED Provider Notes (Signed)
CSN: [id number]     Arrival date & time 12/15/15  1733 History  By signing my name below, I, Rayna Sexton, attest that this documentation has been prepared under the direction and in the presence of Ottis Vacha C. Marius Betts, PA-C. Electronically Signed: Rayna Sexton, ED Scribe. 12/15/2015. 6:07 PM.    Chief Complaint  Patient presents with  . Back Pain   The history is provided by the patient. No language interpreter was used.   HPI Comments: Morgan Torres is a 65 y.o. female with a PMHx of sciatica, lumbar herniated disk and arthritis who presents to the Emergency Department complaining of worsening, moderate, left lower back pain x 3 days. She was evaluated by her PCP yesterday for similar symptoms and was prescribed tramadol and prednisone. Pt states that she took 3x prednisone and 2x tramadol earlier today without relief, but does not know the dosages. Pt states that she received a steroid injection in her spine for sciatica last week in her RLE which was performed at [facility] by Dr. Maryjean Ka. She reports associated nausea due to pain and a radiation of her pain down her LLE. Pt reports a SHx including lumbar fusion and total hip arthroplasty. She denies any recent falls or trauma. Pt confirms her listed allergies stating diclofenac causes throat swelling/hives and codeine and hydrocodone cause itching. She denies incontinence of her bowels or bladder, constipation, urinary changes, neuro deficits, or any other associated symptoms at this time.    Past Medical History  Diagnosis Date  . Lumbar herniated disc   . Sciatica   . Hypertension   . Small bowel obstruction ([place])   . COPD (chronic obstructive pulmonary disease) ([place])   . Depression   . Anxiety   . Bronchitis     Hx: of  . Pneumonia     Hx: of  . Fibromyalgia   . Myocardial infarction [facility]) 2014    followed by Dr. Debara Pickett, treated medically, no stents  . Asthma     seasonal allergies   . Arthritis     hip, lumbar spine    . Coronary artery disease    Past Surgical History  Procedure Laterality Date  . Abdominal surgery      resection of small intestine  . Abdominal hysterectomy    . Tonsillectomy    . Dilation and curettage of uterus    . Colonoscopy      Hx; of  . Colon surgery      resection for bowel - for obstruction   . Back surgery      fusion  . Cardiac catheterization  2014  . Hernia repair  1970's    inguinal hernia   . Salivary gland surgery Left 1970-1980    approached from inside mouth & side of neck  . Total hip arthroplasty Left 05/28/2014    Procedure: LEFT TOTAL HIP ARTHROPLASTY;  Surgeon: Yvette Rack., MD;  Location: [facility];  Service: Orthopedics;  Laterality: Left;  . Total hip arthroplasty Left 05/28/2014    dr caffrey  . Left heart catheterization with coronary angiogram N/A 03/30/2013    Procedure: LEFT HEART CATHETERIZATION WITH CORONARY ANGIOGRAM;  Surgeon: Leonie Man, MD;  Location: [facility] CATH LAB;  Service: Cardiovascular;  Laterality: N/A;   Family History  Problem Relation Age of Onset  . Hypertension Mother   . Diabetes Mother   . Cancer - Other Mother   . Cancer Sister    Social History  Substance Use Topics  .  Smoking status: Current Every Day Smoker -- 0.40 packs/day for 25 years    Types: Cigarettes  . Smokeless tobacco: Never Used     Comment: Currently on the Nicoderm patch."smokes a few"  . Alcohol Use: Yes     Comment: 1 glass of wine occas. (1 every 2 weeks or so)   OB History    No data available     Review of Systems  Constitutional: Negative for fever and chills.  Gastrointestinal: Positive for nausea. Negative for abdominal pain and constipation.  Genitourinary: Negative for dysuria, urgency, hematuria, flank pain and difficulty urinating.  Musculoskeletal: Positive for myalgias and back pain.  Neurological: Negative for dizziness, syncope, weakness, light-headedness, numbness and headaches.  All other systems reviewed and are  negative.   Allergies  Diclofenac; Codeine; and Hydrocodone  Home Medications   Prior to Admission medications   Medication Sig Start Date End Date Taking? Authorizing Provider  Acetaminophen-Guaifenesin 325-200 MG TABS Take 2 tablets by mouth 3 (three) times daily. 10/17/14   Britt Bottom, NP  albuterol (PROVENTIL HFA;VENTOLIN HFA) 108 (90 BASE) MCG/ACT inhaler Inhale 2 puffs into the lungs every 4 (four) hours as needed for wheezing or shortness of breath. 03/27/15   Sherwood Gambler, MD  amLODipine (NORVASC) 5 MG tablet Take 1 tablet (5 mg total) by mouth daily. 09/26/15   Pixie Casino, MD  atorvastatin (LIPITOR) 40 MG tablet TAKE 1 TABLET BY MOUTH DAILY AT 6 PM 05/23/15   Pixie Casino, MD  azithromycin (ZITHROMAX) 250 MG tablet Take 1 tablet (250 mg total) by mouth daily. Take first 2 tablets together, then 1 every day until finished. 10/17/14   Britt Bottom, NP  benzonatate (TESSALON) 100 MG capsule Take 100 mg by mouth 3 (three) times daily as needed for cough.    Historical Provider, MD  EPINEPHrine (EPIPEN 2-PAK) 0.3 mg/0.3 mL DEVI Inject 0.3 mLs (0.3 mg total) into the muscle once as needed (for severe allergic reaction). CAll 911 immediately if you have to use this medicine 11/26/12   Baron Sane, PA-C  gabapentin (NEURONTIN) 100 MG capsule Take 100 mg by mouth at bedtime.    Historical Provider, MD  LORazepam (ATIVAN) 1 MG tablet Take 1 mg by mouth daily as needed for anxiety.    Historical Provider, MD  losartan-hydrochlorothiazide (HYZAAR) 100-12.5 MG per tablet Take 1 tablet by mouth daily.    Historical Provider, MD  meloxicam (MOBIC) 15 MG tablet Take 1 tablet by mouth as needed. 07/23/15   Historical Provider, MD  metoprolol tartrate (LOPRESSOR) 25 MG tablet TAKE 0.5 TABLETS (12.5 MG TOTAL) BY MOUTH 2 (TWO) TIMES DAILY. 08/25/15   Pixie Casino, MD  nitroGLYCERIN (NITROSTAT) 0.4 MG SL tablet Place 1 tablet (0.4 mg total) under the tongue every 5 (five) minutes  x 3 doses as needed for chest pain. 04/01/13   Erlene Quan, PA-C  ondansetron (ZOFRAN ODT) 4 MG disintegrating tablet Take 1 tablet (4 mg total) by mouth every 8 (eight) hours as needed for nausea or vomiting. 12/15/15   Lovelace Cerveny C Jadiel Schmieder, PA-C  OVER THE COUNTER MEDICATION Take 1 tablet by mouth daily. Herbal Life - Dietary Supplement    Historical Provider, MD  oxyCODONE-acetaminophen (PERCOCET/ROXICET) 5-325 MG tablet Take 1-2 tablets by mouth every 4 (four) hours as needed for severe pain. 12/15/15   Lanaiya Lantry C Bevan Vu, PA-C  predniSONE (DELTASONE) 20 MG tablet Take 2 tablets (40 mg total) by mouth daily. 03/28/15   Sherwood Gambler, MD  BP 181/104 mmHg  Pulse 78  Temp(Src) 98.6 F (37 C) (Oral)  Resp 20  SpO2 100%    Physical Exam  Constitutional: She is oriented to person, place, and time. She appears well-developed and well-nourished. No distress.  HENT:  Head: Normocephalic and atraumatic.  Eyes: Conjunctivae are normal.  Neck: Neck supple.  Cardiovascular: Normal rate, regular rhythm, normal heart sounds and intact distal pulses.   Pulmonary/Chest: Effort normal and breath sounds normal. No respiratory distress.  Abdominal: Soft. There is no tenderness. There is no guarding.  Musculoskeletal: She exhibits tenderness. She exhibits no edema.  Tenderness to the left and midline lumbar musculature. Full ROM in all extremities and spine. No other paraspinal tenderness.   Lymphadenopathy:    She has no cervical adenopathy.  Neurological: She is alert and oriented to person, place, and time. She has normal reflexes.  No sensory deficits. Strength 5/5 in all extremities. No gait disturbance. Coordination intact.   Skin: Skin is warm and dry. She is not diaphoretic.  Psychiatric: She has a normal mood and affect. Her behavior is normal.  Nursing note and vitals reviewed.   ED Course  Procedures  DIAGNOSTIC STUDIES: Oxygen Saturation is 100% on RA, normal by my interpretation.    COORDINATION OF  CARE: 6:05 PM Discussed next steps with pt. Pt verbalized understanding and is agreeable with the plan.   Labs Reviewed  BASIC METABOLIC PANEL - Abnormal; Notable for the following:    Sodium 132 (*)    Chloride 98 (*)    Glucose, Bld 173 (*)    All other components within normal limits  CBC WITH DIFFERENTIAL/PLATELET - Abnormal; Notable for the following:    WBC 12.9 (*)    Hemoglobin 15.1 (*)    Neutro Abs 10.4 (*)    All other components within normal limits    Imaging Review Ct Lumbar Spine Wo Contrast  12/15/2015  CLINICAL DATA:  Extreme back pain for a few weeks without response to injection and medication. Previous back surgery. EXAM: CT LUMBAR SPINE WITHOUT CONTRAST TECHNIQUE: Multidetector CT imaging of the lumbar spine was performed without intravenous contrast administration. Multiplanar CT image reconstructions were also generated. COMPARISON:  MRI lumbar spine 11/01/2015 and plain films 05/12/2013 FINDINGS: Subtle curvature convex left. Mild spondylosis throughout the lumbar spine. Vertebral body heights are within normal. Posterior fusion hardware is present at the L5-S1 level with laminectomy at this level. The left L5 pedicle screw extends along the superior endplate of L5 and is unchanged. Intervertebral cage is present at the L5-S1 level. Remaining disc spaces are maintained. Axial images demonstrate facet arthropathy at the T11-12 and T12-L1 levels. The L1-2 level demonstrates no disc herniation, canal stenosis or neural foraminal narrowing. Facet arthropathy is present. The L2-3 level demonstrates a minimal broad-based disc bulge without disc herniation. No significant neural foraminal narrowing or canal stenosis. Moderate facet arthropathy is present. The L3-4 level demonstrates a mild broad-based disc bulge without focal disc herniation. No significant canal stenosis. Mild right-sided neural foraminal narrowing. Moderate facet arthropathy. The L4-5 level demonstrates a  broad-based disc bulge asymmetric to the right far lateral location likely abutting the exiting L4 nerve root. No focal disc herniation. Moderate right-sided neural foraminal narrowing. Moderate facet arthropathy. No significant canal stenosis. L5-S1 level somewhat difficult to visualize due to moderate streak artifact. Mild increased soft tissue density over the canal in this region likely partially due to granulation tissue. No definite neural foraminal narrowing. Degenerative changes of the sacroiliac joints.  Calcified plaque over the abdominal aorta and iliac arteries. IMPRESSION: Mild spondylosis throughout the lumbar spine with mild disc disease as described from the L2-3 level to the L4-5 level. No focal disc herniation. Right far lateral asymmetric disc bulge at the L4-5 level likely abutting the exiting L4 nerve root with moderate right-sided neural foraminal narrowing at this level. Mild right-sided neural foraminal narrowing at the L3-4 level. Posterior fusion hardware at the L5-S1 level intact. Note that the left L5 pedicle screw courses along the superior endplate of L5 unchanged. Previous L5 laminectomy. Aortic atherosclerosis. Electronically Signed   By: Marin Olp M.D.   On: 12/15/2015 19:15   Mr Lumbar Spine W Wo Contrast  12/16/2015  CLINICAL DATA:  Initial evaluation for worsening left lower back pain. EXAM: MRI LUMBAR SPINE WITHOUT AND WITH CONTRAST TECHNIQUE: Multiplanar and multiecho pulse sequences of the lumbar spine were obtained without and with intravenous contrast. CONTRAST:  52mL MULTIHANCE GADOBENATE DIMEGLUMINE 529 MG/ML IV SOLN COMPARISON:  Prior CT from earlier the same day as well as previous MRI from 11/01/2015. FINDINGS: Segmentation: Normal lumbosacral anatomy. Lowest well-formed disc is presumed to be the L5-S1 level. Alignment: Trace anterolisthesis of L5 on S1, stable. Vertebral bodies otherwise normally aligned with preservation of the normal lumbar lordosis. No  interval listhesis or subluxation. Vertebrae: Vertebral body heights maintained. No acute or chronic fracture. Signal intensity within the vertebral body bone marrow is normal. No abnormal enhancement. Conus medullaris: Extends to the L2 level and appears normal. Nerve roots of the cauda equina within normal limits. Paraspinal and other soft tissues: Paraspinous soft demonstrate no acute abnormality. Disc levels: T10-11: Seen only on sagittal projection. Probable small right foraminal disc protrusion without significant stenosis or evidence of neural impingement (series 4, image 4). T11-12: Disc desiccation without significant disc bulge. No stenosis. T12-L1:  Negative. L1-2: No disc bulge or disc protrusion. Mild bilateral facet and ligamentum flavum hypertrophy. No stenosis. L2-3: Shallow left foraminal/far lateral disc protrusion, closely approximating the exiting left L2 nerve root without frank neural impingement. Superimposed mild facet and ligamentum flavum hypertrophy. Short pedicles. Mild canal and bilateral foraminal narrowing. L3-4: Shallow broad-based posterior disc bulge. Superimposed shallow left far lateral disc protrusion without neural impingement, stable. Facet and ligamentum flavum hypertrophy. Short pedicles. Mild canal and foraminal narrowing. L4-5: Shallow broad-based posterior disc bulge. There is a superimposed shallow right foraminal disc protrusion abutting the exiting right L4 nerve root in the right neural foramen (series 4, image 4). Superimposed right worse than left moderate facet arthrosis. Mild ligamentum flavum thickening. Mild to moderate canal stenosis is stable. Moderate bilateral foraminal narrowing also unchanged. L5-S1: Status post posterior decompression with interbody fusion. Hardware is stable. No residual canal stenosis. Foramina remain relatively patent. Enhancing granulation tissue within the right lateral epidural space and surrounding the descending right S1 nerve  root. IMPRESSION: 1. Stable appearance of the lumbar spine relative to recent MRI from 11/01/2015. 2. Status post interbody fusion at L5-S1 without residual stenosis. Enhancing postoperative granulation tissue within the right lateral epidural space and about the descending right S1 nerve root. 3. Broad-based posterior disc bulge at L4-5 with superimposed shallow right foraminal disc protrusion, abutting the exiting right L4 nerve root. Mild to moderate canal with moderate bilateral foraminal stenosis at this level is stable. 4. Shallow left foraminal disc protrusion at L2-3, closely approximating the exiting left L2 nerve root. 5. Multilevel facet arthropathy as above, stable. Electronically Signed   By: Jeannine Boga M.D.   On:  12/16/2015 00:31   MRI lumbar spine on 11/01/2015: IMPRESSION: 1. Interbody fusion at L5-S1 without foraminal or central canal stenosis. 2. At L4-5 there is a mild broad-based disc bulge. Moderate bilateral facet arthropathy. Mild spinal stenosis. Moderate bilateral foraminal stenosis. 3. At L2-3 there is a small right lateral disc protrusion abutting the right extraforaminal L2 nerve root. Mild bilateral facet Arthropathy.  I have personally reviewed and evaluated these images and lab results as part of my medical decision-making.   EKG Interpretation None      MDM   Final diagnoses:  Midline low back pain with left-sided sciatica  Low back pain  Lower back pain  S/P epidural steroid injection    Morgan Torres presents with chronic lower back pain, worse over the last few weeks.  Findings and plan of care discussed with Jola Schmidt, MD. Dr. Venora Maples personally evaluated and examined this patient.  Patient has no neuro or functional deficits. Ambulatory with assistance from a cane, which is normal for her. This seems to be a chronic issue for which she has been seen by multiple providers. CT shows no acute changes. MRI ordered due to the patient's  recent epidural steroid injection. Patient gave myself and the staff some challenges when attempting to provide care for her. Patient would shy away from the slightest touch of her back, scream out, and continually moan no matter what was happening. Patient states she cannot walk, however, she was noted to get off the chair without assistance and walk to the door of her room, opening the door, and lookout into the hallway. An IV was established to facilitate IV contrast as well as IV pain management. Patient started screaming about the pain from the IV before it was even placed. A second attempt had to be made. A patent IV was established, but the patient began yelling and screaming to take the IV out. There was no sign of infiltration or other abnormality. Patient's MRI shows no acute changes. Patient's pain was able to be controlled here in the ED. Patient will need to follow up with neurosurgery as soon as possible.  Filed Vitals:   12/15/15 1738 12/15/15 2156 12/16/15 0019 12/16/15 0207  BP: 181/104 175/81 170/75 189/90  Pulse: 78 82 78 76  Temp: 98.6 F (37 C) 98.7 F (37.1 C) 98.6 F (37 C) 98.2 F (36.8 C)  TempSrc: Oral Oral Oral   Resp: 20 20 20    SpO2: 100% 93% 96% 98%     I personally performed the services described in this documentation, which was scribed in my presence. The recorded information has been reviewed and is accurate.   Lorayne Bender, PA-C 12/16/15 [place], MD 12/18/15 2322

## 2015-12-15 NOTE — Discharge Instructions (Signed)
You have been seen today for back pain. Your imaging showed no acute abnormalities. Follow up with neurosurgery as soon as possible on this matter. Follow up with PCP as needed should symptoms continue. Return to ED should symptoms worsen.

## 2015-12-16 MED ORDER — GADOBENATE DIMEGLUMINE 529 MG/ML IV SOLN
20.0000 mL | Freq: Once | INTRAVENOUS | Status: AC | PRN
  Administered 2015-12-16: 20 mL via INTRAVENOUS

## 2016-05-10 ENCOUNTER — Encounter (HOSPITAL_COMMUNITY): Payer: Self-pay | Admitting: Emergency Medicine

## 2016-05-10 ENCOUNTER — Ambulatory Visit (HOSPITAL_COMMUNITY)
Admission: EM | Admit: 2016-05-10 | Discharge: 2016-05-10 | Disposition: A | Discharge status: Home / Self Care | Payer: Medicare HMO | Attending: Emergency Medicine | Admitting: Emergency Medicine

## 2016-05-10 DIAGNOSIS — J209 Acute bronchitis, unspecified: Secondary | ICD-10-CM | POA: Diagnosis not present

## 2016-05-10 DIAGNOSIS — J441 Chronic obstructive pulmonary disease with (acute) exacerbation: Secondary | ICD-10-CM

## 2016-05-10 MED ORDER — ALBUTEROL SULFATE HFA 108 (90 BASE) MCG/ACT IN AERS: 2.0000 | INHALATION_SPRAY | Freq: Four times a day (QID) | RESPIRATORY_TRACT | 0 refills | Status: AC | PRN

## 2016-05-10 MED ORDER — IPRATROPIUM-ALBUTEROL 0.5-2.5 (3) MG/3ML IN SOLN
RESPIRATORY_TRACT | Status: AC
  Filled 2016-05-10: qty 3

## 2016-05-10 MED ORDER — AZITHROMYCIN 250 MG PO TABS: 250.0000 mg | ORAL_TABLET | Freq: Every day | ORAL | 0 refills | Status: DC

## 2016-05-10 MED ORDER — IPRATROPIUM-ALBUTEROL 0.5-2.5 (3) MG/3ML IN SOLN
3.0000 mL | Freq: Once | RESPIRATORY_TRACT | Status: AC
  Administered 2016-05-10: 3 mL via RESPIRATORY_TRACT

## 2016-05-10 MED ORDER — PREDNISONE 50 MG PO TABS: ORAL_TABLET | ORAL | 0 refills | Status: DC

## 2016-05-10 NOTE — Discharge Instructions (Signed)
Start Zithromax as directed. Take Prednisone 50mg  daily for 5 days. Use Albuterol (Ventolin) inhaler 2 puffs every 6 hours as needed for wheezing. Increase fluids to help loosen up mucus. May take Delsym every 12 hours as needed for cough. Follow-up in 3 to 4 days with your primary care provider if not improving.

## 2016-05-10 NOTE — ED Notes (Signed)
Pt. Stated, I can breath a little better.

## 2016-05-10 NOTE — ED Triage Notes (Signed)
The patient presented to the [facility] with a complaint of a cough for 1 week. The patient reported a cough with congestion and throat and ear pain. 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 The patient also reported using OTC mucinex and robitussin with minimal results.

## 2016-05-10 NOTE — ED Provider Notes (Signed)
CSN: [id number]     Arrival date & time 05/10/16  1449 History   First MD Initiated Contact with Patient 05/10/16 1827     Chief Complaint  Patient presents with  . Cough   (Consider location/radiation/quality/duration/timing/severity/associated sxs/prior Treatment) 65 year old female presents with nasal congestion, cough, chest congestion, sore throat, chills, fatigue and left ear pain for the past week. Denies any fever or GI symptoms. She has taken Mucinex, Robitussin and hot tea with minimal relief. She has history of asthma and COPD and used an Albuterol inhaler today but it expired 2 years ago (Sept 2015).    The history is provided by the patient.    Past Medical History:  Diagnosis Date  . Anxiety   . Arthritis    hip, lumbar spine   . Asthma    seasonal allergies   . Bronchitis    Hx: of  . COPD (chronic obstructive pulmonary disease) ([place])   . Coronary artery disease   . Depression   . Fibromyalgia   . Hypertension   . Lumbar herniated disc   . Myocardial infarction 2014   followed by Dr. Debara Pickett, treated medically, no stents  . Pneumonia    Hx: of  . Sciatica   . Small bowel obstruction    Past Surgical History:  Procedure Laterality Date  . ABDOMINAL HYSTERECTOMY    . ABDOMINAL SURGERY     resection of small intestine  . BACK SURGERY     fusion  . CARDIAC CATHETERIZATION  2014  . COLON SURGERY     resection for bowel - for obstruction   . COLONOSCOPY     Hx; of  . DILATION AND CURETTAGE OF UTERUS    . HERNIA REPAIR  1970's   inguinal hernia   . LEFT HEART CATHETERIZATION WITH CORONARY ANGIOGRAM N/A 03/30/2013   Procedure: LEFT HEART CATHETERIZATION WITH CORONARY ANGIOGRAM;  Surgeon: Leonie Man, MD;  Location: [facility] CATH LAB;  Service: Cardiovascular;  Laterality: N/A;  . SALIVARY GLAND SURGERY Left 1970-1980   approached from inside mouth & side of neck  . TONSILLECTOMY    . TOTAL HIP ARTHROPLASTY Left 05/28/2014   Procedure: LEFT TOTAL HIP  ARTHROPLASTY;  Surgeon: Yvette Rack., MD;  Location: [place];  Service: Orthopedics;  Laterality: Left;  . TOTAL HIP ARTHROPLASTY Left 05/28/2014   dr caffrey   Family History  Problem Relation Age of Onset  . Hypertension Mother   . Diabetes Mother   . Cancer - Other Mother   . Cancer Sister    Social History  Substance Use Topics  . Smoking status: Current Every Day Smoker    Packs/day: 0.40    Years: 25.00    Types: Cigarettes  . Smokeless tobacco: Never Used     Comment: Currently on the Nicoderm patch."smokes a few"  . Alcohol use Yes     Comment: 1 glass of wine occas. (1 every 2 weeks or so)   OB History    No data available     Review of Systems  Constitutional: Positive for chills and fatigue. Negative for diaphoresis and fever.  HENT: Positive for congestion, ear pain, rhinorrhea, sinus pressure and sore throat.   Eyes: Negative for discharge.  Respiratory: Positive for cough, shortness of breath and wheezing. Negative for chest tightness.   Cardiovascular: Negative for chest pain.  Gastrointestinal: Negative for abdominal pain, diarrhea, nausea and vomiting.  Musculoskeletal: Negative for neck pain and neck stiffness.  Skin: Negative  for rash.  Neurological: Positive for headaches. Negative for dizziness, weakness and light-headedness.  Hematological: Negative for adenopathy.    Allergies  Diclofenac; Codeine; and Hydrocodone  Home Medications   Prior to Admission medications   Medication Sig Start Date End Date Taking? Authorizing Provider  amLODipine (NORVASC) 5 MG tablet Take 1 tablet (5 mg total) by mouth daily. 09/26/15  Yes Pixie Casino, MD  atorvastatin (LIPITOR) 40 MG tablet TAKE 1 TABLET BY MOUTH DAILY AT 6 PM 05/23/15  Yes Pixie Casino, MD  gabapentin (NEURONTIN) 100 MG capsule Take 100 mg by mouth at bedtime.   Yes Historical Provider, MD  LORazepam (ATIVAN) 1 MG tablet Take 1 mg by mouth daily as needed for anxiety.   Yes Historical  Provider, MD  losartan-hydrochlorothiazide (HYZAAR) 100-12.5 MG per tablet Take 1 tablet by mouth daily.   Yes Historical Provider, MD  meloxicam (MOBIC) 15 MG tablet Take 1 tablet by mouth as needed. 07/23/15  Yes Historical Provider, MD  albuterol (PROVENTIL HFA;VENTOLIN HFA) 108 (90 Base) MCG/ACT inhaler Inhale 2 puffs into the lungs every 6 (six) hours as needed for wheezing or shortness of breath. 05/10/16   Katy Apo, NP  azithromycin (ZITHROMAX) 250 MG tablet Take 1 tablet (250 mg total) by mouth daily. Take first 2 tablets together, then 1 every day until finished. 05/10/16   Katy Apo, NP  EPINEPHrine (EPIPEN 2-PAK) 0.3 mg/0.3 mL DEVI Inject 0.3 mLs (0.3 mg total) into the muscle once as needed (for severe allergic reaction). CAll 911 immediately if you have to use this medicine 11/26/12   Baron Sane, PA-C  metoprolol tartrate (LOPRESSOR) 25 MG tablet TAKE 0.5 TABLETS (12.5 MG TOTAL) BY MOUTH 2 (TWO) TIMES DAILY. 08/25/15   Pixie Casino, MD  nitroGLYCERIN (NITROSTAT) 0.4 MG SL tablet Place 1 tablet (0.4 mg total) under the tongue every 5 (five) minutes x 3 doses as needed for chest pain. 04/01/13   Erlene Quan, PA-C  ondansetron (ZOFRAN ODT) 4 MG disintegrating tablet Take 1 tablet (4 mg total) by mouth every 8 (eight) hours as needed for nausea or vomiting. 12/15/15   Shawn C Joy, PA-C  OVER THE COUNTER MEDICATION Take 1 tablet by mouth daily. Herbal Life - Dietary Supplement    Historical Provider, MD  oxyCODONE-acetaminophen (PERCOCET/ROXICET) 5-325 MG tablet Take 1-2 tablets by mouth every 4 (four) hours as needed for severe pain. 12/15/15   Shawn C Joy, PA-C  predniSONE (DELTASONE) 50 MG tablet Take 1 tablet by mouth daily for 5 days. 05/10/16   Katy Apo, NP   Meds Ordered and Administered this Visit   Medications  ipratropium-albuterol (DUONEB) 0.5-2.5 (3) MG/3ML nebulizer solution 3 mL (3 mLs Nebulization Given 05/10/16 1841)    BP 148/66 (BP Location:  Right Arm)   Pulse 88   Temp 98.3 F (36.8 C) (Oral)   Resp 20   SpO2 100%  No data found.   Physical Exam  Constitutional: She is oriented to person, place, and time. She appears well-developed and well-nourished. She appears ill. No distress.  HENT:  Head: Normocephalic and atraumatic.  Right Ear: Hearing, tympanic membrane, external ear and ear canal normal.  Left Ear: Hearing, tympanic membrane, external ear and ear canal normal.  Nose: Mucosal edema and rhinorrhea present. Right sinus exhibits maxillary sinus tenderness. Right sinus exhibits no frontal sinus tenderness. Left sinus exhibits maxillary sinus tenderness. Left sinus exhibits no frontal sinus tenderness.  Mouth/Throat: Uvula is midline and mucous  membranes are normal. Posterior oropharyngeal erythema present.  Neck: Normal range of motion. Neck supple.  Cardiovascular: Normal rate, regular rhythm and normal heart sounds.   Pulmonary/Chest: Effort normal. No respiratory distress. She has wheezes in the right lower field and the left lower field. She has rhonchi in the right upper field, the right lower field, the left upper field and the left lower field. She has no rales.  Lymphadenopathy:    She has no cervical adenopathy.  Neurological: She is alert and oriented to person, place, and time.  Skin: Skin is warm and dry. Capillary refill takes less than 2 seconds. No rash noted.  Psychiatric: She has a normal mood and affect. Her behavior is normal. Judgment and thought content normal.    Urgent Care Course   Clinical Course     Procedures (including critical care time)  Labs Review Labs Reviewed - No data to display  Imaging Review No results found.   Visual Acuity Review  Right Eye Distance:   Left Eye Distance:   Bilateral Distance:    Right Eye Near:   Left Eye Near:    Bilateral Near:         MDM   1. Acute bronchitis, unspecified organism   2. COPD with acute exacerbation ([place])     DUONEB provided- patient tolerated well. Less wheezing in all lung fields but still present. Looser course breath sounds. Even air exchange. Discussed with patient that she has bronchitis with underlying exacerbation of COPD and asthma. Recommend start Zithromax as directed. Wrote for new Rx for Albuterol since previous medication had expired- 2 puffs every 6 hours as needed for wheezing. Start Prednisone 50mg  daily for 5 days. Encouraged to d/c/ smoking. Increase fluid intake to help loosen mucus. Follow-up with her primary care provider in 3 to 4 days if not improving.      Katy Apo, NP 05/11/16 1700

## 2016-08-27 ENCOUNTER — Ambulatory Visit (INDEPENDENT_AMBULATORY_CARE_PROVIDER_SITE_OTHER): Payer: Medicare HMO | Admitting: Internal Medicine

## 2016-08-27 ENCOUNTER — Encounter: Payer: Self-pay | Admitting: Internal Medicine

## 2016-08-27 VITALS — BP 136/66 | HR 69 | Ht 68.0 in | Wt 230.8 lb

## 2016-08-27 DIAGNOSIS — E782 Mixed hyperlipidemia: Secondary | ICD-10-CM | POA: Diagnosis not present

## 2016-08-27 DIAGNOSIS — I428 Other cardiomyopathies: Secondary | ICD-10-CM

## 2016-08-27 DIAGNOSIS — G478 Other sleep disorders: Secondary | ICD-10-CM | POA: Diagnosis not present

## 2016-08-27 DIAGNOSIS — I10 Essential (primary) hypertension: Secondary | ICD-10-CM

## 2016-08-27 DIAGNOSIS — R0683 Snoring: Secondary | ICD-10-CM | POA: Diagnosis not present

## 2016-08-27 NOTE — Patient Instructions (Signed)
Your physician wants you to follow-up in: ONE YEAR with Dr. Hilty. You will receive a reminder letter in the mail two months in advance. If you don't receive a letter, please call our office to schedule the follow-up appointment.  

## 2016-08-28 DIAGNOSIS — R0683 Snoring: Secondary | ICD-10-CM | POA: Insufficient documentation

## 2016-08-28 DIAGNOSIS — G478 Other sleep disorders: Secondary | ICD-10-CM | POA: Insufficient documentation

## 2016-08-28 NOTE — Progress Notes (Signed)
OFFICE NOTE  Chief Complaint:  Fatigue  Primary Care Physician: Maggie Font, MD  HPI:  Morgan Torres is a 66 y.o. female smoker with HTN admitted 03/29/13 with [place] and later ruled in for a NSTEMI with a Troponin peak of 1.5. Cath done 03/30/13 revealed angiographically only mild to moderate CAD, with no obvious "Culprit Lesion" for NSTEMI. She also had bnormal LV Gram findings with WMA suggestive of Takotsubo cardiomyopathy. Echo revealed an EF of 55% with no obvious WMA. The plan is for medical Rx. She has been counseled on smoking cessation. It should be noted that she also developed post NSTEMI anterior TWI on EKG. She followed up with [name], PA-C a few months ago and reported doing good. She has not smoked since being discharged. She had some mild CP with day after discharge but none since. She currently denies nausea, vomiting, fever, shortness of breath, orthopnea, dizziness, PND, cough, congestion, abdominal pain, hematochezia, melena, lower extremity edema.  Morgan Torres returns today. She is feeling well from a cardiac standpoint. Recently she has been having problems with her hip and underwent an injection. There is a possibility of surgery however she is not surprisingly not interested in that. Blood pressure is well-controlled. She denies any chest pain or worsening shortness of breath. She says she is working on smoking cessation. She is unable to exercise much due to her hip pain.  I saw Morgan Torres back today in the office. She denies any cardiac sounding chest pain. She reports she is still having problems with fatigue, insominia and general daytime sleepiness.  08/27/2016  Morgan Torres was seen today in follow-up. Overall she seems to be doing well. Blood pressure is well controlled. Weight is still elevated and has not changed much. She reports continued sleepiness. She did have an abnormal sleep study with a suggestion for a referral for CPAP. She has not yet received  her device. He denies any recurrent chest pain.  PMHx:  Past Medical History:  Diagnosis Date  . Anxiety   . Arthritis    hip, lumbar spine   . Asthma    seasonal allergies   . Bronchitis    Hx: of  . COPD (chronic obstructive pulmonary disease) ([place])   . Coronary artery disease   . Depression   . Fibromyalgia   . Hypertension   . Lumbar herniated disc   . Myocardial infarction 2014   followed by Dr. Debara Pickett, treated medically, no stents  . Pneumonia    Hx: of  . Sciatica   . Small bowel obstruction     Past Surgical History:  Procedure Laterality Date  . ABDOMINAL HYSTERECTOMY    . ABDOMINAL SURGERY     resection of small intestine  . BACK SURGERY     fusion  . CARDIAC CATHETERIZATION  2014  . COLON SURGERY     resection for bowel - for obstruction   . COLONOSCOPY     Hx; of  . DILATION AND CURETTAGE OF UTERUS    . HERNIA REPAIR  1970's   inguinal hernia   . LEFT HEART CATHETERIZATION WITH CORONARY ANGIOGRAM N/A 03/30/2013   Procedure: LEFT HEART CATHETERIZATION WITH CORONARY ANGIOGRAM;  Surgeon: Leonie Man, MD;  Location: [facility] CATH LAB;  Service: Cardiovascular;  Laterality: N/A;  . SALIVARY GLAND SURGERY Left 1970-1980   approached from inside mouth & side of neck  . TONSILLECTOMY    . TOTAL HIP ARTHROPLASTY Left 05/28/2014   Procedure:  LEFT TOTAL HIP ARTHROPLASTY;  Surgeon: Yvette Rack., MD;  Location: [place];  Service: Orthopedics;  Laterality: Left;  . TOTAL HIP ARTHROPLASTY Left 05/28/2014   dr caffrey    FAMHx:  Family History  Problem Relation Age of Onset  . Hypertension Mother   . Diabetes Mother   . Cancer - Other Mother   . Cancer Sister     SOCHx:   reports that she has been smoking Cigarettes.  She has a 10.00 pack-year smoking history. She has never used smokeless tobacco. She reports that she drinks alcohol. She reports that she does not use drugs.  ALLERGIES:  Allergies  Allergen Reactions  . Diclofenac Anaphylaxis  . Codeine  Itching  . Hydrocodone Itching    ROS: A comprehensive review of systems was negative except for: Constitutional: positive for sleepiness, fatigue  HOME MEDS: Current Outpatient Prescriptions  Medication Sig Dispense Refill  . albuterol (PROVENTIL HFA;VENTOLIN HFA) 108 (90 Base) MCG/ACT inhaler Inhale 2 puffs into the lungs every 6 (six) hours as needed for wheezing or shortness of breath. 1 Inhaler 0  . amLODipine (NORVASC) 5 MG tablet Take 1 tablet (5 mg total) by mouth daily. 30 tablet 10  . benzonatate (TESSALON) 100 MG capsule Take 1 capsule by mouth 3 (three) times daily as needed for cough.  0  . BESIVANCE 0.6 % SUSP Place 1 drop into surgery eye only at intervals of 4-12 hours.  5  . diazepam (VALIUM) 10 MG tablet Take 1 tablet by mouth daily as needed.  0  . DUREZOL 0.05 % EMUL Place 1 drop into surgery eye only twice daily for 1 week, then once daily for 1 week.  1  . EPINEPHrine (EPIPEN 2-PAK) 0.3 mg/0.3 mL DEVI Inject 0.3 mLs (0.3 mg total) into the muscle once as needed (for severe allergic reaction). CAll 911 immediately if you have to use this medicine 1 Device 1  . gabapentin (NEURONTIN) 300 MG capsule Take 300 mg by mouth at bedtime.  2  . LORazepam (ATIVAN) 1 MG tablet Take 1 mg by mouth daily as needed for anxiety.    Marland Kitchen losartan-hydrochlorothiazide (HYZAAR) 100-12.5 MG per tablet Take 1 tablet by mouth daily.    . metoprolol tartrate (LOPRESSOR) 25 MG tablet TAKE 0.5 TABLETS (12.5 MG TOTAL) BY MOUTH 2 (TWO) TIMES DAILY. 30 tablet 11  . nitroGLYCERIN (NITROSTAT) 0.4 MG SL tablet Place 1 tablet (0.4 mg total) under the tongue every 5 (five) minutes x 3 doses as needed for chest pain. 25 tablet 2  . OVER THE COUNTER MEDICATION Take 1 tablet by mouth daily. Herbal Life - Dietary Supplement    . PROLENSA 0.07 % SOLN Place 1 drop into surgery eye only once daily for 2 weeks then stop.  1  . traMADol (ULTRAM) 50 MG tablet Take 1 tablet by mouth every 4 (four) hours as needed.  0     No current facility-administered medications for this visit.     LABS/IMAGING: No results found for this or any previous visit (from the past 48 hour(s)). No results found.  VITALS: BP 136/66   Pulse 69   Ht 5\' 8"  (1.727 m)   Wt 230 lb 12.8 oz (104.7 kg)   BMI 35.09 kg/m   EXAM: General appearance: alert and no distress Neck: no carotid bruit and no JVD Lungs: clear to auscultation bilaterally Heart: regular rate and rhythm, S1, S2 normal, no murmur, click, rub or gallop Abdomen: soft, non-tender; bowel sounds  normal; no masses,  no organomegaly Extremities: extremities normal, atraumatic, no cyanosis or edema Pulses: 2+ and symmetric Skin: Skin color, texture, turgor normal. No rashes or lesions Neurologic: Grossly normal Psych: Mood, affect normal  EKG: Normal sinus rhythm at 69  ASSESSMENT: 1. History of NSTEMI without any clear culprit lesion on cath 2. Possible Takatsubo cardiomyopathy 3. Dyslipidemia - followed by pcp 4. Hypertension - at goal 5. Tobacco abuse - working on quitting 6. Excessive daytime sleepiness and witnessed apnea  PLAN: 1.   Mrs. Pautsch did have an abnormal sleep study and hopefully will be fitted with CPAP soon. She continues to have excessive daytime sleepiness. Her blood pressure is however improved. She continues to work on smoking cessation. She's had no further chest pain. Follow-up with me annually or sooner as necessary.  Pixie Casino, MD, [facility] Attending Cardiologist [place] 08/28/2016, 5:14 PM

## 2016-08-29 ENCOUNTER — Telehealth: Payer: Self-pay | Admitting: *Deleted

## 2016-08-29 NOTE — Telephone Encounter (Signed)
Attempted to call patient to discuss her sleep study situation. Was unable to leave a message. Mailbox full. Will try again at a later date.

## 2016-08-30 ENCOUNTER — Telehealth: Payer: Self-pay | Admitting: *Deleted

## 2016-08-30 ENCOUNTER — Other Ambulatory Visit: Payer: Self-pay | Admitting: *Deleted

## 2016-08-30 DIAGNOSIS — G4733 Obstructive sleep apnea (adult) (pediatric): Secondary | ICD-10-CM

## 2016-08-30 NOTE — Telephone Encounter (Signed)
Unable to leave a message. Was able however to leave a "SMS" message of Phone #.

## 2016-08-30 NOTE — Telephone Encounter (Signed)
Called and spoke with patient informing her that after reviewing her chart and speaking with [facility], due to the amount that has passed since her last sleep study, her insurance will require that she gets another study. [name] with [facility] recommends that we order a home sleep study. This will be more cost effective and faster for the patient. Patient agrees with plan. I apologized for the inconvenience this might have caused.

## 2016-08-30 NOTE — Telephone Encounter (Signed)
Called and notified patient of her home sleep study consultation scheduled April 4th 11:00 am. [facility].

## 2016-09-05 ENCOUNTER — Other Ambulatory Visit: Payer: Self-pay | Admitting: Internal Medicine

## 2016-09-06 ENCOUNTER — Other Ambulatory Visit: Payer: Self-pay | Admitting: Internal Medicine

## 2016-09-11 ENCOUNTER — Telehealth: Payer: Self-pay | Admitting: *Deleted

## 2016-09-11 NOTE — Telephone Encounter (Signed)
-----   Message from Freada Bergeron, [place] sent at 09/06/2016  1:11 PM EDT ----- Regarding: sleep study Please call this patient she said she talked to you last week about a sleep study. Thanks, Gae Bon

## 2016-09-11 NOTE — Telephone Encounter (Signed)
Returned a call to patient. Answered some additional questions she had about what's next in the process.

## 2016-09-26 ENCOUNTER — Ambulatory Visit (HOSPITAL_BASED_OUTPATIENT_CLINIC_OR_DEPARTMENT_OTHER): Payer: Medicare HMO | Attending: Cardiovascular Disease | Admitting: Cardiovascular Disease

## 2016-09-26 VITALS — Ht 68.0 in | Wt 220.0 lb

## 2016-09-26 DIAGNOSIS — R0683 Snoring: Secondary | ICD-10-CM

## 2016-09-26 DIAGNOSIS — G4733 Obstructive sleep apnea (adult) (pediatric): Secondary | ICD-10-CM | POA: Insufficient documentation

## 2016-09-26 DIAGNOSIS — G4736 Sleep related hypoventilation in conditions classified elsewhere: Secondary | ICD-10-CM | POA: Insufficient documentation

## 2016-10-04 ENCOUNTER — Other Ambulatory Visit (HOSPITAL_BASED_OUTPATIENT_CLINIC_OR_DEPARTMENT_OTHER): Payer: Self-pay

## 2016-10-04 DIAGNOSIS — G4733 Obstructive sleep apnea (adult) (pediatric): Secondary | ICD-10-CM

## 2016-10-13 NOTE — Procedures (Signed)
    Patient Name: Morgan Torres, Morgan Torres Date: 09/26/2016 Gender: Female D.O.B: 02-Dec-1950 Age (years): 29 Referring Provider: Nadean Corwin Hilty Height (inches): 68 Interpreting Physician: Shelva Majestic MD, ABSM Weight (lbs): 220 RPSGT: Jacolyn Reedy BMI: 80 MRN: [id number] Neck Size: 15.00  CLINICAL INFORMATION Sleep Study Type: HST  Indication for sleep study: OSA  Epworth Sleepiness Score: 3  Prior polysomnogram dated 08/28/2015 revealed an AHI of 6.5/h and RDI of 9.1/h. Most recent titration study dated 11/21/2015 was optimal at 15cm H2O with an AHI of 26.9/h.  SLEEP STUDY TECHNIQUE A multi-channel overnight portable sleep study was performed. The channels recorded were: nasal airflow, thoracic respiratory movement, and oxygen saturation with a pulse oximetry. Snoring was also monitored.  MEDICATIONS albuterol (PROVENTIL HFA;VENTOLIN HFA) 108 (90 Base) MCG/ACT inhaler amLODipine (NORVASC) 5 MG tablet benzonatate (TESSALON) 100 MG capsule BESIVANCE 0.6 % SUSP diazepam (VALIUM) 10 MG tablet DUREZOL 0.05 % EMUL EPINEPHrine (EPIPEN 2-PAK) 0.3 mg/0.3 mL DEVI gabapentin (NEURONTIN) 300 MG capsule LORazepam (ATIVAN) 1 MG tablet losartan-hydrochlorothiazide (HYZAAR) 100-12.5 MG per tablet metoprolol tartrate (LOPRESSOR) 25 MG tablet nitroGLYCERIN (NITROSTAT) 0.4 MG SL tablet OVER THE COUNTER MEDICATION PROLENSA 0.07 % SOLN traMADol (ULTRAM) 50 MG tablet  Patient self administered medications include: N/A.  SLEEP ARCHITECTURE Patient was studied for 303.0 minutes. The sleep efficiency was 78.6 % and the patient was supine for 45.1%. The arousal index was 0.0 per hour.  RESPIRATORY PARAMETERS The overall AHI was 24.8 per hour, with a central apnea index of 0.0 per hour.  The oxygen nadir was 80% during sleep.  CARDIAC DATA Mean heart rate during sleep was 73.4 bpm.  IMPRESSIONS - Moderate obstructive sleep apnea occurred during this home study with an overall AHI  24.8/h. - No significant central sleep apnea occurred during this study (CAI = 0.0/h). - There is a mild positional component with supine sleep AHI 27.2/h and non-supine sleep AHI 22.7/h. - Severe oxygen desaturation to a nadir of 80%. - Patient snored 22.3% during the sleep.  DIAGNOSIS - Obstructive Sleep Apnea (327.23 [G47.33 ICD-10]) - Nocturnal Hypoxemia (327.26 [G47.36 ICD-10])  RECOMMENDATIONS - The patient had previously undergone a CPAP titration study in May/2017 and CPAP at 15 cm water pressure was recommended at that time. If a follow-up CPAP titration is not required by the insurance provider recommend initial therapy with CPAP Auto with EPR of 3 at 10 - 20 cm - Efforts should be made to optimize nasal and oropharyngeal patency. - Avoid alcohol, sedatives and other CNS depressants that may worsen sleep apnea and disrupt normal sleep architecture. - Sleep hygiene should be reviewed to assess factors that may improve sleep quality. - Weight management and regular exercise should be initiated or continued. - Recommend a download be obtained in 30 days and sleep clinic evaluation. -    [Electronically signed] 10/13/2016 05:42 PM  Shelva Majestic MD, [facility], ABSM Diplomate, American Board of Sleep Medicine   NPI: [id number] [place] PH: [id number]   FX: [phone] [place]

## 2016-10-16 ENCOUNTER — Telehealth: Payer: Self-pay | Admitting: Internal Medicine

## 2016-10-16 NOTE — Telephone Encounter (Signed)
The pt wants to know if you have Sleep Study results?

## 2016-10-17 ENCOUNTER — Telehealth: Payer: Self-pay | Admitting: Cardiovascular Disease

## 2016-10-17 NOTE — Telephone Encounter (Signed)
Returned the phone call to the patient to inform her that a CPAP machine was being ordered and sent to insurance for approval (per the physician's nurse) The nurse will call her on Friday with specific instructions. She verbalized her understanding.

## 2016-10-17 NOTE — Telephone Encounter (Signed)
New Message    Have you heard anything from the sleep study? She has been waiting awhile and has not gotten response back

## 2016-10-23 NOTE — Telephone Encounter (Signed)
Patient notified of home sleep study results and recommendations. She requests that her CPAP orders be sent to [facility].

## 2016-10-23 NOTE — Progress Notes (Signed)
Faxed order for CPAP to  [facility].

## 2016-10-23 NOTE — Progress Notes (Signed)
Patient referred to [facility] for CPAP set up.

## 2017-02-14 ENCOUNTER — Telehealth: Payer: Self-pay | Admitting: Cardiovascular Disease

## 2017-02-14 NOTE — Telephone Encounter (Signed)
Spoke with pt, she received a call from [employer] telling her they need something before they can supply her needs. Spoke with [facility], copy of last office note and sleep study faxed to [phone] at their request. Patient made aware and she will call if they contact her again.

## 2017-02-14 NOTE — Telephone Encounter (Signed)
New message    Pt is calling asking for a call back about her sleep supplies. Please call.

## 2017-03-12 ENCOUNTER — Telehealth: Payer: Self-pay | Admitting: Cardiovascular Disease

## 2017-03-12 ENCOUNTER — Ambulatory Visit (INDEPENDENT_AMBULATORY_CARE_PROVIDER_SITE_OTHER): Payer: Medicare HMO | Admitting: Cardiovascular Disease

## 2017-03-12 ENCOUNTER — Encounter: Payer: Self-pay | Admitting: Cardiovascular Disease

## 2017-03-12 VITALS — BP 126/60 | HR 73 | Ht 68.0 in | Wt 236.0 lb

## 2017-03-12 DIAGNOSIS — I5181 Takotsubo syndrome: Secondary | ICD-10-CM | POA: Diagnosis not present

## 2017-03-12 DIAGNOSIS — G4733 Obstructive sleep apnea (adult) (pediatric): Secondary | ICD-10-CM | POA: Diagnosis not present

## 2017-03-12 DIAGNOSIS — R079 Chest pain, unspecified: Secondary | ICD-10-CM | POA: Diagnosis not present

## 2017-03-12 DIAGNOSIS — I428 Other cardiomyopathies: Secondary | ICD-10-CM

## 2017-03-12 DIAGNOSIS — E668 Other obesity: Secondary | ICD-10-CM | POA: Diagnosis not present

## 2017-03-12 NOTE — Patient Instructions (Signed)
Medication Instructions:  Your physician recommends that you continue on your current medications as directed. Please refer to the Current Medication list given to you today.  Follow-Up: Your physician recommends that you schedule a follow-up appointment in: as planned with Dr. Debara Pickett.   Any Other Special Instructions Will Be Listed Below (If Applicable).     If you need a refill on your cardiac medications before your next appointment, please call your pharmacy.

## 2017-03-12 NOTE — Telephone Encounter (Signed)
New Message   Went to PCP yesterday and they did EKG and it came back abnormal . Set pt appt 9/19   Pt c/o of Chest Pain: STAT if CP now or developed within 24 hours  1. Are you having CP right now? No   2. Are you experiencing any other symptoms (ex. SOB, nausea, vomiting, sweating)? Indigestion , bp elevated , sob and hot flashes   3. How long have you been experiencing CP? Since sunday  4. Is your CP continuous or coming and going?  Comes and goes   5. Have you taken Nitroglycerin?  no ?

## 2017-03-12 NOTE — Telephone Encounter (Signed)
Returned the call to the patient. She had been placed on Dr. Evette Georges schedule for tomorrow at 9:40. Per the patient, her PCP had wanted her to be seen today. She has been moved to 2:20 today with Dr. Claiborne Billings. A call has been made to her PCP to have her EKG faxed over.

## 2017-03-13 ENCOUNTER — Ambulatory Visit: Payer: Medicare HMO | Admitting: Cardiovascular Disease

## 2017-03-15 NOTE — Progress Notes (Signed)
Cardiology Office Note    Date:  03/18/2017   ID:  Morgan, Torres 02/15/1951, MRN [id number]  PCP:  [name], MD  Cardiologist:  Shelva Majestic, MD   Chief Complaint  Patient presents with  . Follow-up    Abn EKG  . Shortness of Breath  . Chest Pain    Yesterday.    History of Present Illness:  Morgan Torres is a 66 y.o. female who is seen in the office today as a "add-on "with complaints of mild chest discomfort.  She is a patient of Dr. Debara Pickett.  She also was recently started on CPAP therapy.  Morgan Torres was last seen by Dr. Debara Pickett in March 2018.  Prior cardiac catheterization revealed mild to moderate CAD, and she was felt to have a Takotsubo pattern cardiomyopathy noted on left ventriculography.  She has not had any exertional type chest pain symptomatology.  She called the office yesterday with complaints of mild chest discomfort which is sharp and nonexertional and possibly related to indigestion.  She was added onto my schedule to be seen today.  Upon further questioning, she denies any clear-cut exertional symptoms.  Her pain is sharp and lasts seconds.  Other times she has noticed some mild dyspeptic type symptoms and her symptoms seemed to improve with belching.  She was seen by her primary physician yesterday who advised she be added on to be seen in the office today.  She had undergone asleep study for sleep apnea in March 2017 which revealed mild sleep apnea.  A previous titration study showed CPAP.  Optimal pressure at 15 cm.  She recently underwent a home study with an overall AHI of 24.8 per hour.  She had severe oxygen desaturation to 80% and store 22.3% of the time.  She has been on CPAP therapy since May 2018 with [facility].  Since initiating CPAP.  She is sleeping better.  Typically goes to bed at 9, but falls asleep between 11 and midnight and is up at 7.  A download was obtained from August 19 through 03/11/2017 which showed 100% of usage.  However,  usage greater than 4 hours was only 67% and averaging 4 hours and 45 minutes.  She has an AutoSet unit and her 95th percentile pressure was 12.7.  There was mild leak.  AHI was excellent at 2.6.   Past Medical History:  Diagnosis Date  . Anxiety   . Arthritis    hip, lumbar spine   . Asthma    seasonal allergies   . Bronchitis    Hx: of  . COPD (chronic obstructive pulmonary disease) ([place])   . Coronary artery disease   . Depression   . Fibromyalgia   . Hypertension   . Lumbar herniated disc   . Myocardial infarction [facility]) 2014   followed by Dr. Debara Pickett, treated medically, no stents  . Pneumonia    Hx: of  . Sciatica   . Small bowel obstruction [facility])     Past Surgical History:  Procedure Laterality Date  . ABDOMINAL HYSTERECTOMY    . ABDOMINAL SURGERY     resection of small intestine  . BACK SURGERY     fusion  . CARDIAC CATHETERIZATION  2014  . COLON SURGERY     resection for bowel - for obstruction   . COLONOSCOPY     Hx; of  . DILATION AND CURETTAGE OF UTERUS    . HERNIA REPAIR  1970's   inguinal hernia   .  LEFT HEART CATHETERIZATION WITH CORONARY ANGIOGRAM N/A 03/30/2013   Procedure: LEFT HEART CATHETERIZATION WITH CORONARY ANGIOGRAM;  Surgeon: Leonie Man, MD;  Location: [facility] [place] CATH LAB;  Service: Cardiovascular;  Laterality: N/A;  . SALIVARY GLAND SURGERY Left 1970-1980   approached from inside mouth & side of neck  . TONSILLECTOMY    . TOTAL HIP ARTHROPLASTY Left 05/28/2014   Procedure: LEFT TOTAL HIP ARTHROPLASTY;  Surgeon: Yvette Rack., MD;  Location: [place];  Service: Orthopedics;  Laterality: Left;  . TOTAL HIP ARTHROPLASTY Left 05/28/2014   dr caffrey    Current Medications: Outpatient Medications Prior to Visit  Medication Sig Dispense Refill  . albuterol (PROVENTIL HFA;VENTOLIN HFA) 108 (90 Base) MCG/ACT inhaler Inhale 2 puffs into the lungs every 6 (six) hours as needed for wheezing or shortness of breath. 1 Inhaler 0  . amLODipine (NORVASC) 5 MG  tablet TAKE 1 TABLET (5 MG TOTAL) BY MOUTH DAILY. 90 tablet 3  . benzonatate (TESSALON) 100 MG capsule Take 1 capsule by mouth 3 (three) times daily as needed for cough.  0  . DUREZOL 0.05 % EMUL Place 1 drop into surgery eye only twice daily for 1 week, then once daily for 1 week.  1  . EPINEPHrine (EPIPEN 2-PAK) 0.3 mg/0.3 mL DEVI Inject 0.3 mLs (0.3 mg total) into the muscle once as needed (for severe allergic reaction). CAll 911 immediately if you have to use this medicine 1 Device 1  . gabapentin (NEURONTIN) 300 MG capsule Take 300 mg by mouth at bedtime.  2  . LORazepam (ATIVAN) 1 MG tablet Take 1 mg by mouth daily as needed for anxiety.    Marland Kitchen losartan-hydrochlorothiazide (HYZAAR) 100-12.5 MG per tablet Take 1 tablet by mouth daily.    . metoprolol tartrate (LOPRESSOR) 25 MG tablet Take 0.5 tablets (12.5 mg total) by mouth 2 (two) times daily. 30 tablet 11  . nitroGLYCERIN (NITROSTAT) 0.4 MG SL tablet Place 1 tablet (0.4 mg total) under the tongue every 5 (five) minutes x 3 doses as needed for chest pain. 25 tablet 2  . BESIVANCE 0.6 % SUSP Place 1 drop into surgery eye only at intervals of 4-12 hours.  5  . diazepam (VALIUM) 10 MG tablet Take 1 tablet by mouth daily as needed.  0  . OVER THE COUNTER MEDICATION Take 1 tablet by mouth daily. Herbal Life - Dietary Supplement    . PROLENSA 0.07 % SOLN Place 1 drop into surgery eye only once daily for 2 weeks then stop.  1  . traMADol (ULTRAM) 50 MG tablet Take 1 tablet by mouth every 4 (four) hours as needed.  0   No facility-administered medications prior to visit.      Allergies:   Diclofenac; Codeine; and Hydrocodone   Social History   Social History  . Marital status: Divorced    Spouse name: N/A  . Number of children: N/A  . Years of education: N/A   Social History Main Topics  . Smoking status: Current Every Day Smoker    Packs/day: 0.40    Years: 25.00    Types: Cigarettes  . Smokeless tobacco: Never Used     Comment:  Currently on the Nicoderm patch."smokes a few"  . Alcohol use Yes     Comment: 1 glass of wine occas. (1 every 2 weeks or so)  . Drug use: No  . Sexual activity: Not Currently   Other Topics Concern  . None   Social History Narrative  .  None     Family History:  The patient's family history includes Cancer in her sister; Cancer - Other in her mother; Diabetes in her mother; Hypertension in her mother.   ROS General: Negative; No fevers, chills, or night sweats;  HEENT: Negative; No changes in vision or hearing, sinus congestion, difficulty swallowing Pulmonary: Negative; No cough, wheezing, shortness of breath, hemoptysis Cardiovascular: Negative; No chest pain, presyncope, syncope, palpitations GI: Negative; No nausea, vomiting, diarrhea, or abdominal pain GU: Negative; No dysuria, hematuria, or difficulty voiding Musculoskeletal: Negative; no myalgias, joint pain, or weakness Hematologic/Oncology: Negative; no easy bruising, bleeding Endocrine: Negative; no heat/cold intolerance; no diabetes Neuro: Negative; no changes in balance, headaches Skin: Negative; No rashes or skin lesions Psychiatric: Negative; No behavioral problems, depression Sleep: Negative; No snoring, daytime sleepiness, hypersomnolence, bruxism, restless legs, hypnogognic hallucinations, no cataplexy Other comprehensive 14 point system review is negative.   PHYSICAL EXAM:   VS:  BP 126/60   Pulse 73   Ht 5' 8" (1.727 m)   Wt 236 lb (107 kg)   BMI 35.88 kg/m    Wt Readings from Last 3 Encounters:  03/12/17 236 lb (107 kg)  10/03/16 220 lb (99.8 kg)  08/27/16 230 lb 12.8 oz (104.7 kg)    General: Alert, oriented, no distress.  Skin: normal turgor, no rashes, warm and dry HEENT: Normocephalic, atraumatic. Pupils equal round and reactive to light; sclera anicteric; extraocular muscles intact;  Nose without nasal septal hypertrophy Mouth/Parynx benign; Mallinpatti scale 3/4 Neck: No JVD, no carotid  bruits; normal carotid upstroke Lungs: clear to ausculatation and percussion; no wheezing or rales Chest wall: without tenderness to palpitation Heart: PMI not displaced, RRR, s1 s2 normal, 1/6 systolic murmur, no diastolic murmur, no rubs, gallops, thrills, or heaves Abdomen: soft, nontender; no hepatosplenomehaly, BS+; abdominal aorta nontender and not dilated by palpation. Back: no CVA tenderness Pulses 2+ Musculoskeletal: full range of motion, normal strength, no joint deformities Extremities: no clubbing cyanosis or edema, Homan's sign negative  Neurologic: grossly nonfocal; Cranial nerves grossly wnl Psychologic: Normal mood and affect   Studies/Labs Reviewed:   EKG:  EKG is ordered today.  ECG (independently read by me): Normal sinus rhythm at 73 bpm.  No ST segment changes.  Normal intervals.  No ectopy.  Recent Labs: BMP Latest Ref Rng & Units 12/15/2015 03/27/2015 06/03/2014  Glucose 65 - 99 mg/dL 173(H) 93 110(H)  BUN 6 - 20 mg/dL _0 Creatinine 0.44 - 1.00 mg/dL 0.75 0.73 0.66  Sodium 135 - 145 mmol/L 132(L) 137 133(L)  Potassium 3.5 - 5.1 mmol/L 4.4 3.5 4.0  Chloride 101 - 111 mmol/L 98(L) 99(L) 93(L)  CO2 22 - 32 mmol/L _1 Calcium 8.9 - 10.3 mg/dL 9.7 9.2 8.8     Hepatic Function Latest Ref Rng & Units 06/03/2014 05/18/2014 05/02/2010  Total Protein 6.0 - 8.3 g/dL 5.8(L) 7.1 6.7  Albumin 3.5 - 5.2 g/dL 2.2(L) 3.8 3.5  AST 0 - 37 U/L _2 ALT 0 - 35 U/L _3 Alk Phosphatase 39 - 117 U/L 71 103 91  Total Bilirubin 0.3 - 1.2 mg/dL 0.4 0.2(L) 0.7    CBC Latest Ref Rng & Units 12/15/2015 03/27/2015 06/03/2014  WBC 4.0 - 10.5 K/uL 12.9(H) 5.7 9.7  Hemoglobin 12.0 - 15.0 g/dL 15.1(H) 15.5(H) 8.5(L)  Hematocrit 36.0 - 46.0 % 44.4 46.0 25.7(L)  Platelets 150 - 400 K/uL 310 229 280   Lab Results  Component Value  Date   MCV 91.7 12/15/2015   MCV 94.5 03/27/2015   MCV 93.1 06/03/2014   Lab Results  Component Value Date   TSH 0.604 03/29/2013    Lab Results  Component Value Date   HGBA1C 6.0 (H) 03/29/2013     BNP No results found for: BNP  ProBNP No results found for: PROBNP   Lipid Panel     Component Value Date/Time   CHOL 231 (H) 03/30/2013 0929   TRIG 105 03/30/2013 0929   HDL 51 03/30/2013 0929   CHOLHDL 4.5 03/30/2013 0929   VLDL 21 03/30/2013 0929   LDLCALC 159 (H) 03/30/2013 0929     RADIOLOGY: No results found.   Additional studies/ records that were reviewed today include:  I reviewed the prior office records of Dr. Debara Pickett.  I also reviewed the patient's sleep study from 09/26/2016, and prior sleep data from 2017 studies.    ASSESSMENT:    1. Chest pain, unspecified type   2. Nonischemic cardiomyopathy ([place])   3. Takotsubo physiology at cath 03/30/13- EF 45% at cath, 55% by echo   4. OSA (obstructive sleep apnea)   5. Moderate obesity      PLAN:  Morgan Torres is a 66 year old female who was seen as an add-on with complaints of chest discomfort.  Her chest pain has atypical features and is nonexertional and at times sharp.  She also has noticed some vague indigestion symptoms and believes her symptoms improved with belching.  Remotely, she had undergone prior cardiac catheterization and was not found to have high-grade obstructive disease.  At that time she was felt to have a Takotsubo cardiomyopathy pattern to her LV.  Her ECG was reviewed from yesterday when taken by her primary physician, which is unremarkable and did not show any significant abnormality.  There was normal sinus rhythm at 78 bpm with normal intervals and no ST segment changes.  The only questionable area was possible left atrial enlargement.  I do not believe that her chest pain is ischemic in etiology and every use shorter as such.  However, while she was here, I realized that she had recently been started on CPAP therapy in May 2018.  Her DME company is [facility].  I reviewed her most recent download.  We discussed  the importance of improved sleep duration.  She is 100% compliant with reference to usage stays, but only 67% with usage greater than 4 hours.  Her AHI is excellent on her auto setting and her 95th percentile pressure is 12.7cwp.  There is a mild mask leak.  We discussed mask adjustment and cleansing.  She is moderately obese.  Weight loss was strongly recommended.  She will follow-up with Dr. Debara Pickett for a cardiac standpoint.  I will obtain a new download for verification of usage hours per night for compliance standards.   Medication Adjustments/Labs and Tests Ordered: Current medicines are reviewed at length with the patient today.  Concerns regarding medicines are outlined above.  Medication changes, Labs and Tests ordered today are listed in the Patient Instructions below. Patient Instructions  Medication Instructions:  Your physician recommends that you continue on your current medications as directed. Please refer to the Current Medication list given to you today.  Follow-Up: Your physician recommends that you schedule a follow-up appointment in: as planned with Dr. Debara Pickett.   Any Other Special Instructions Will Be Listed Below (If Applicable).     If you need a refill on your cardiac medications before your  next appointment, please call your pharmacy.      Signed, Shelva Majestic, MD  03/18/2017 12:32 PM    [facility] [address], [place], [place], [place]  [address] Phone: [phone]

## 2017-04-03 ENCOUNTER — Telehealth: Payer: Self-pay | Admitting: Cardiovascular Disease

## 2017-04-03 NOTE — Telephone Encounter (Signed)
Morgan Torres is calling because she is needing Dr. Claiborne Billings to send a letter to her insurance Company [employer]) stating that she needs a sanitizer for her Sleep Apnea machine , because she keeps getting sinus affections . Please call

## 2017-04-03 NOTE — Telephone Encounter (Signed)
Returned call to patient.She stated she called [facility] about cpap machine causing sinus infections.Stated she was told she needs a sterilizer,she cannot afford.Stated she needs a letter from [facility] saying she needs  sterilizer for cpap so insurance will pay for.Advised I will send message to Dr.Kelly's RN.

## 2017-04-04 NOTE — Telephone Encounter (Signed)
Will write letter of support

## 2017-04-09 NOTE — Telephone Encounter (Signed)
Letter created, spoke to patient who is aware.    Request letter be faxed to insurance company [employer] (histocompatibility and immunogenetics)) ph # [phone]

## 2017-05-13 ENCOUNTER — Emergency Department (HOSPITAL_COMMUNITY)
Admission: EM | Admit: 2017-05-13 | Discharge: 2017-05-13 | Disposition: A | Discharge status: Home / Self Care | Payer: Medicare HMO | Attending: Emergency Medicine | Admitting: Emergency Medicine

## 2017-05-13 ENCOUNTER — Emergency Department (HOSPITAL_COMMUNITY): Payer: Medicare HMO

## 2017-05-13 ENCOUNTER — Encounter (HOSPITAL_COMMUNITY): Payer: Self-pay | Admitting: Emergency Medicine

## 2017-05-13 ENCOUNTER — Other Ambulatory Visit: Payer: Self-pay

## 2017-05-13 DIAGNOSIS — I2510 Atherosclerotic heart disease of native coronary artery without angina pectoris: Secondary | ICD-10-CM | POA: Insufficient documentation

## 2017-05-13 DIAGNOSIS — Z79899 Other long term (current) drug therapy: Secondary | ICD-10-CM | POA: Diagnosis not present

## 2017-05-13 DIAGNOSIS — J449 Chronic obstructive pulmonary disease, unspecified: Secondary | ICD-10-CM | POA: Insufficient documentation

## 2017-05-13 DIAGNOSIS — M25552 Pain in left hip: Secondary | ICD-10-CM | POA: Insufficient documentation

## 2017-05-13 DIAGNOSIS — M545 Low back pain, unspecified: Secondary | ICD-10-CM

## 2017-05-13 DIAGNOSIS — Z7982 Long term (current) use of aspirin: Secondary | ICD-10-CM | POA: Insufficient documentation

## 2017-05-13 DIAGNOSIS — J45909 Unspecified asthma, uncomplicated: Secondary | ICD-10-CM | POA: Insufficient documentation

## 2017-05-13 DIAGNOSIS — I10 Essential (primary) hypertension: Secondary | ICD-10-CM | POA: Insufficient documentation

## 2017-05-13 DIAGNOSIS — Z96642 Presence of left artificial hip joint: Secondary | ICD-10-CM | POA: Insufficient documentation

## 2017-05-13 DIAGNOSIS — D649 Anemia, unspecified: Secondary | ICD-10-CM | POA: Insufficient documentation

## 2017-05-13 DIAGNOSIS — F17210 Nicotine dependence, cigarettes, uncomplicated: Secondary | ICD-10-CM | POA: Insufficient documentation

## 2017-05-13 MED ORDER — DIAZEPAM 5 MG PO TABS: 5.0000 mg | ORAL_TABLET | Freq: Two times a day (BID) | ORAL | 0 refills | Status: DC

## 2017-05-13 MED ORDER — DIAZEPAM 5 MG PO TABS
5.0000 mg | ORAL_TABLET | Freq: Once | ORAL | Status: AC
  Administered 2017-05-13: 5 mg via ORAL
  Filled 2017-05-13: qty 1

## 2017-05-13 MED ORDER — ONDANSETRON 4 MG PO TBDP
4.0000 mg | ORAL_TABLET | Freq: Once | ORAL | Status: AC
  Administered 2017-05-13: 4 mg via ORAL
  Filled 2017-05-13: qty 1

## 2017-05-13 MED ORDER — OXYCODONE-ACETAMINOPHEN 5-325 MG PO TABS
1.0000 | ORAL_TABLET | ORAL | Status: DC | PRN
Start: 2017-05-13 — End: 2017-05-13
  Administered 2017-05-13: 1 via ORAL
  Filled 2017-05-13: qty 1

## 2017-05-13 MED ORDER — ONDANSETRON HCL 4 MG/2ML IJ SOLN: 4.0000 mg | Freq: Once | INTRAMUSCULAR | Status: DC

## 2017-05-13 MED ORDER — HYDROMORPHONE HCL 1 MG/ML IJ SOLN
1.0000 mg | Freq: Once | INTRAMUSCULAR | Status: AC
  Administered 2017-05-13: 1 mg via INTRAVENOUS
  Filled 2017-05-13: qty 1

## 2017-05-13 NOTE — ED Triage Notes (Signed)
Pt report history of back and hip pain but reports that both are much worse tonight.  She reports yesterday she felt a "pop" in her left hip which has caused more pain.  She reports feeling nauseated but believes it is b/c of the pain.

## 2017-05-13 NOTE — ED Provider Notes (Signed)
[facility] EMERGENCY DEPARTMENT Provider Note   CSN: [id number] Arrival date & time: 05/13/17  0144     History   Chief Complaint Chief Complaint  Patient presents with  . Hip Pain  . Back Pain    HPI Morgan Torres is a 66 y.o. female.  HPI  Pt presenting with c/o low back pain on left side that radiates around left hip.  She states pain began when she was bending over to pick something up off the floor 2 days ago.  Pain has been constant since that time.  Pain worse with moveement and certain position.  No weakness of legs.  No incontinence of urine or stool.  No urinrary retention.  No dysuria or fevers.  She has had similar back pain in the past.  ROS reviewed and all otherwise negative except for mentioned in HPI  Past Medical History:  Diagnosis Date  . Anxiety   . Arthritis    hip, lumbar spine   . Asthma    seasonal allergies   . Bronchitis    Hx: of  . COPD (chronic obstructive pulmonary disease) ([place])   . Coronary artery disease   . Depression   . Fibromyalgia   . Hypertension   . Lumbar herniated disc   . Myocardial infarction [facility]) 2014   followed by Dr. Debara Pickett, treated medically, no stents  . Pneumonia    Hx: of  . Sciatica   . Small bowel obstruction [facility])     Patient Active Problem List   Diagnosis Date Noted  . Morbid obesity, unspecified obesity type ([place]) 08/28/2016  . Non-restorative sleep 08/28/2016  . Snoring 08/28/2016  . Hip pain 06/03/2014  . Acute blood loss anemia 06/01/2014  . Osteoarthritis of left hip 05/28/2014  . Obesity (BMI 30.0-34.9) 04/20/2013  . Nonischemic cardiomyopathy ([place]) 04/01/2013  . Smoking 04/01/2013  . NSTEMI - Troponin pk 1.5 with ant TWI on EKG 03/29/2013  . Spinal stenosis, lumbar region, with neurogenic claudication 01/23/2013  . VAGINAL TRICHOMONIASIS 06/28/2006  . Hyperlipidemia 06/28/2006  . ANEMIA-NOS 06/28/2006  . ANXIETY 06/28/2006  . DEPRESSION 06/28/2006  . SYNDROME, CARPAL  TUNNEL 06/28/2006  . Essential hypertension 06/28/2006  . ALLERGIC RHINITIS 06/28/2006  . ASTHMA 06/28/2006  . PAROTIDITIS 06/28/2006  . LOW BACK PAIN 06/28/2006  . TB SKIN TEST, POSITIVE, HX OF 06/28/2006    Past Surgical History:  Procedure Laterality Date  . ABDOMINAL HYSTERECTOMY    . ABDOMINAL SURGERY     resection of small intestine  . BACK SURGERY     fusion  . CARDIAC CATHETERIZATION  2014  . COLON SURGERY     resection for bowel - for obstruction   . COLONOSCOPY     Hx; of  . DILATION AND CURETTAGE OF UTERUS    . HERNIA REPAIR  1970's   inguinal hernia   . LEFT HEART CATHETERIZATION WITH CORONARY ANGIOGRAM N/A 03/30/2013   Performed by [place]ie Man, MD at [facility] CATH LAB  . LEFT TOTAL HIP ARTHROPLASTY Left 05/28/2014   Performed by Yvette Rack., MD at [facility] OR  . Lumbar Five-Sacral One Posterior Lumbar Interbody Fusion N/A 01/23/2013   Performed by Charlie Pitter, MD at [facility] NEURO ORS  . SALIVARY GLAND SURGERY Left 1970-1980   approached from inside mouth & side of neck  . TONSILLECTOMY    . TOTAL HIP ARTHROPLASTY Left 05/28/2014   dr caffrey    OB History    No  data available       Home Medications    Prior to Admission medications   Medication Sig Start Date End Date Taking? Authorizing Provider  acetaminophen (TYLENOL) 500 MG tablet Take 1,000 mg every 6 (six) hours as needed by mouth for mild pain.   Yes [provider]  amLODipine (NORVASC) 5 MG tablet TAKE 1 TABLET (5 MG TOTAL) BY MOUTH DAILY. 09/05/16  Yes Hilty, Nadean Corwin, MD  aspirin 81 MG tablet Take 81 mg by mouth daily.   Yes [provider]  benzonatate (TESSALON) 100 MG capsule Take 1 capsule by mouth 3 (three) times daily as needed for cough. 08/13/16  Yes [provider]  capsaicin (ZOSTRIX) 0.025 % cream Apply 1 application daily as needed topically.   Yes [provider]  gabapentin (NEURONTIN) 300 MG capsule Take 300 mg by mouth at bedtime. 08/08/16  Yes  [provider]  LORazepam (ATIVAN) 1 MG tablet Take 1 mg by mouth daily as needed for anxiety.   Yes [provider]  losartan-hydrochlorothiazide (HYZAAR) 100-12.5 MG per tablet Take 1 tablet by mouth daily.   Yes [provider]  metoprolol tartrate (LOPRESSOR) 25 MG tablet Take 0.5 tablets (12.5 mg total) by mouth 2 (two) times daily. 09/06/16  Yes Hilty, Nadean Corwin, MD  montelukast (SINGULAIR) 10 MG tablet Take 10 mg by mouth at bedtime.   Yes [provider]  traMADol (ULTRAM) 50 MG tablet Take 50 mg 3 (three) times daily by mouth. 04/25/17  Yes [provider]  albuterol (PROVENTIL HFA;VENTOLIN HFA) 108 (90 Base) MCG/ACT inhaler Inhale 2 puffs into the lungs every 6 (six) hours as needed for wheezing or shortness of breath. 05/10/16   Katy Apo, NP  diazepam (VALIUM) 5 MG tablet Take 1 tablet (5 mg total) 2 (two) times daily by mouth. 05/13/17   Mabe, Forbes Cellar, MD  EPINEPHrine (EPIPEN 2-PAK) 0.3 mg/0.3 mL DEVI Inject 0.3 mLs (0.3 mg total) into the muscle once as needed (for severe allergic reaction). CAll 911 immediately if you have to use this medicine 11/26/12   Piepenbrink, Anderson Malta, PA-C  nitroGLYCERIN (NITROSTAT) 0.4 MG SL tablet Place 1 tablet (0.4 mg total) under the tongue every 5 (five) minutes x 3 doses as needed for chest pain. 04/01/13   Erlene Quan, PA-C    Family History Family History  Problem Relation Age of Onset  . Hypertension Mother   . Diabetes Mother   . Cancer - Other Mother   . Cancer Sister     Social History Social History   Tobacco Use  . Smoking status: Current Every Day Smoker    Packs/day: 0.40    Years: 25.00    Pack years: 10.00    Types: Cigarettes  . Smokeless tobacco: Never Used  . Tobacco comment: Currently on the Nicoderm patch."smokes a few"  Substance Use Topics  . Alcohol use: Yes    Comment: 1 glass of wine occas. (1 every 2 weeks or so)  . Drug use: No     Allergies   Diclofenac;  Codeine; and Hydrocodone   Review of Systems Review of Systems  ROS reviewed and all otherwise negative except for mentioned in HPI   Physical Exam Updated Vital Signs BP (!) 166/73   Pulse 72   Temp 98.2 F (36.8 C) (Oral)   Resp 18   Ht 5\' 8"  (1.727 m)   Wt 99.8 kg (220 lb)   SpO2 93%   BMI 33.45  kg/m  Vitals reviewed Physical Exam Physical Examination: General appearance - alert, well appearing, and in no distress, uncomfortable appearing Mental status - alert, oriented to person, place, and time Eyes - no conjunctival injection, no scleral icterus Chest - clear to auscultation, no wheezes, rales or rhonchi, symmetric air entry Heart - normal rate, regular rhythm, normal S1, S2, no murmurs, rubs, clicks or gallops Abdomen - soft, nontender, nondistended, no masses or organomegaly Back exam -ttp over left lumbar paraspinal region- no midline tenderness, no CVA tenderness Neurological - alert, oriented, normal speech, strength 5/5 in lower extremities, sensation intact Extremities - peripheral pulses normal, no pedal edema, no clubbing or cyanosis Skin - normal coloration and turgor, no rashes  ED Treatments / Results  Labs (all labs ordered are listed, but only abnormal results are displayed) Labs Reviewed - No data to display  EKG  EKG Interpretation None       Radiology Dg Hip Unilat With Pelvis 2-3 Views Left  Result Date: 05/13/2017 CLINICAL DATA:  Left hip pain for 1 week. Hip replacement 2 years ago. EXAM: DG HIP (WITH OR WITHOUT PELVIS) 2-3V LEFT COMPARISON:  05/28/2014 FINDINGS: The postoperative left total hip arthroplasty using non cemented components. Components appear well seated without change in position since previous study. No evidence of acute fracture or dislocation of the left hip. Scattered heterotopic soft tissue calcifications. The pelvis appears intact. SI joints and symphysis pubis are not displaced. Degenerative changes in the right hip.  Postoperative changes of the lumbosacral interspace. Vascular calcifications. IMPRESSION: Left total hip arthroplasty. Components appear well seated. No acute displaced fractures identified. Electronically Signed   By: Lucienne Capers M.D.   On: 05/13/2017 03:22    Procedures Procedures (including critical care time)  Medications Ordered in ED Medications  oxyCODONE-acetaminophen (PERCOCET/ROXICET) 5-325 MG per tablet 1 tablet (1 tablet Oral Given 05/13/17 0451)  ondansetron (ZOFRAN-ODT) disintegrating tablet 4 mg (4 mg Oral Given 05/13/17 0211)  HYDROmorphone (DILAUDID) injection 1 mg (1 mg Intravenous Given 05/13/17 1019)  diazepam (VALIUM) tablet 5 mg (5 mg Oral Given 05/13/17 0947)     Initial Impression / Assessment and Plan / ED Course  I have reviewed the triage vital signs and the nursing notes.  Pertinent labs & imaging results that were available during my care of the patient were reviewed by me and considered in my medical decision making (see chart for details).   pt is feeling much better after meds.  She is agreeable with plan for discharge.  No signs or symptoms of cauda equina.  No fever to suggest epidural abscess.  She was advised to arrange for followup with her primary care doctor and with her neurosurgeon if pain continues.      Final Clinical Impressions(s) / ED Diagnoses   Final diagnoses:  Acute left-sided low back pain without sciatica    ED Discharge Orders        Ordered    diazepam (VALIUM) 5 MG tablet  2 times daily     05/13/17 1052       Mabe, Forbes Cellar, MD 05/13/17 1057

## 2017-05-13 NOTE — Discharge Instructions (Signed)
Return to the ED with any concerns including weakness of legs, not able to urinate, loss of control of bowel or bladder, decreased level of alertness/lethargy, or any other alarming symptoms °

## 2017-05-13 NOTE — ED Notes (Signed)
Patient notably uncomfortable in waiting room.  Due to allergies unable to give most pain meds.  Per patient has taken percocet before.  Given per protocol at this time.

## 2017-05-13 NOTE — ED Notes (Signed)
ED Provider at bedside. 

## 2017-05-13 NOTE — ED Notes (Signed)
Attempted IV X2 right AC, unable to obtain. IV team notified.

## 2017-05-13 NOTE — ED Notes (Signed)
Pt reports she did not have her BP medications today r/t long wait at hospital

## 2017-09-21 ENCOUNTER — Other Ambulatory Visit: Payer: Self-pay | Admitting: Internal Medicine

## 2017-09-24 ENCOUNTER — Other Ambulatory Visit: Payer: Self-pay | Admitting: Internal Medicine

## 2017-09-24 NOTE — Telephone Encounter (Signed)
Rx has been sent to the pharmacy electronically. ° °

## 2017-11-01 ENCOUNTER — Ambulatory Visit (HOSPITAL_COMMUNITY)
Admission: EM | Admit: 2017-11-01 | Discharge: 2017-11-01 | Disposition: A | Discharge status: Home / Self Care | Payer: Medicare HMO | Attending: Family Medicine | Admitting: Family Medicine

## 2017-11-01 ENCOUNTER — Encounter (HOSPITAL_COMMUNITY): Payer: Self-pay | Admitting: Emergency Medicine

## 2017-11-01 DIAGNOSIS — R319 Hematuria, unspecified: Secondary | ICD-10-CM | POA: Diagnosis not present

## 2017-11-01 DIAGNOSIS — Z7982 Long term (current) use of aspirin: Secondary | ICD-10-CM | POA: Insufficient documentation

## 2017-11-01 DIAGNOSIS — F17210 Nicotine dependence, cigarettes, uncomplicated: Secondary | ICD-10-CM | POA: Diagnosis not present

## 2017-11-01 DIAGNOSIS — R350 Frequency of micturition: Secondary | ICD-10-CM | POA: Diagnosis not present

## 2017-11-01 DIAGNOSIS — Z79899 Other long term (current) drug therapy: Secondary | ICD-10-CM | POA: Diagnosis not present

## 2017-11-01 LAB — POCT URINALYSIS DIP (DEVICE)
Bilirubin Urine: NEGATIVE
Glucose, UA: NEGATIVE mg/dL
Ketones, ur: NEGATIVE mg/dL
Leukocytes, UA: NEGATIVE
NITRITE: NEGATIVE
Protein, ur: NEGATIVE mg/dL
Specific Gravity, Urine: 1.015 (ref 1.005–1.030)
Urobilinogen, UA: 0.2 mg/dL (ref 0.0–1.0)
pH: 5 (ref 5.0–8.0)

## 2017-11-01 MED ORDER — SULFAMETHOXAZOLE-TRIMETHOPRIM 800-160 MG PO TABS: 1.0000 | ORAL_TABLET | Freq: Two times a day (BID) | ORAL | 0 refills | Status: AC

## 2017-11-01 NOTE — ED Triage Notes (Signed)
Pt noticed blood in her urine today, states "today i've been going a lot". Denies pain.

## 2017-11-01 NOTE — ED Provider Notes (Signed)
[place]    CSN: [id number] Arrival date & time: 11/01/17  1836     History   Chief Complaint Chief Complaint  Patient presents with  . Hematuria    HPI Morgan Torres is a 67 y.o. female.   The history is provided by the patient. No language interpreter was used.  Hematuria  This is a new (Patient noticed blood in her urine today. Denies previous episode) problem. The current episode started 6 to 12 hours ago. The problem occurs constantly. The problem has been gradually improving. Pertinent negatives include no abdominal pain. Associated symptoms comments: Denies dysuria, no odor to her urine. LMP was many years ago. Nothing aggravates the symptoms. Nothing relieves the symptoms. She has tried nothing for the symptoms.    Past Medical History:  Diagnosis Date  . Anxiety   . Arthritis    hip, lumbar spine   . Asthma    seasonal allergies   . Bronchitis    Hx: of  . COPD (chronic obstructive pulmonary disease) ([place])   . Coronary artery disease   . Depression   . Fibromyalgia   . Hypertension   . Lumbar herniated disc   . Myocardial infarction [facility]) 2014   followed by Dr. Debara Pickett, treated medically, no stents  . Pneumonia    Hx: of  . Sciatica   . Small bowel obstruction [facility])     Patient Active Problem List   Diagnosis Date Noted  . Morbid obesity, unspecified obesity type ([place]) 08/28/2016  . Non-restorative sleep 08/28/2016  . Snoring 08/28/2016  . Hip pain 06/03/2014  . Acute blood loss anemia 06/01/2014  . Osteoarthritis of left hip 05/28/2014  . Obesity (BMI 30.0-34.9) 04/20/2013  . Nonischemic cardiomyopathy ([place]) 04/01/2013  . Smoking 04/01/2013  . NSTEMI - Troponin pk 1.5 with ant TWI on EKG 03/29/2013  . Spinal stenosis, lumbar region, with neurogenic claudication 01/23/2013  . VAGINAL TRICHOMONIASIS 06/28/2006  . Hyperlipidemia 06/28/2006  . ANEMIA-NOS 06/28/2006  . ANXIETY 06/28/2006  . DEPRESSION 06/28/2006  . SYNDROME, CARPAL  TUNNEL 06/28/2006  . Essential hypertension 06/28/2006  . ALLERGIC RHINITIS 06/28/2006  . ASTHMA 06/28/2006  . PAROTIDITIS 06/28/2006  . LOW BACK PAIN 06/28/2006  . TB SKIN TEST, POSITIVE, HX OF 06/28/2006    Past Surgical History:  Procedure Laterality Date  . ABDOMINAL HYSTERECTOMY    . ABDOMINAL SURGERY     resection of small intestine  . BACK SURGERY     fusion  . CARDIAC CATHETERIZATION  2014  . COLON SURGERY     resection for bowel - for obstruction   . COLONOSCOPY     Hx; of  . DILATION AND CURETTAGE OF UTERUS    . HERNIA REPAIR  1970's   inguinal hernia   . LEFT HEART CATHETERIZATION WITH CORONARY ANGIOGRAM N/A 03/30/2013   Procedure: LEFT HEART CATHETERIZATION WITH CORONARY ANGIOGRAM;  Surgeon: Leonie Man, MD;  Location: [facility] CATH LAB;  Service: Cardiovascular;  Laterality: N/A;  . SALIVARY GLAND SURGERY Left 1970-1980   approached from inside mouth & side of neck  . TONSILLECTOMY    . TOTAL HIP ARTHROPLASTY Left 05/28/2014   Procedure: LEFT TOTAL HIP ARTHROPLASTY;  Surgeon: Yvette Rack., MD;  Location: [place];  Service: Orthopedics;  Laterality: Left;  . TOTAL HIP ARTHROPLASTY Left 05/28/2014   dr caffrey    OB History   None      Home Medications    Prior to Admission medications  Medication Sig Start Date End Date Taking? Authorizing Provider  acetaminophen (TYLENOL) 500 MG tablet Take 1,000 mg every 6 (six) hours as needed by mouth for mild pain.    [provider]  albuterol (PROVENTIL HFA;VENTOLIN HFA) 108 (90 Base) MCG/ACT inhaler Inhale 2 puffs into the lungs every 6 (six) hours as needed for wheezing or shortness of breath. 05/10/16   Katy Apo, NP  amLODipine (NORVASC) 5 MG tablet Take 1 tablet (5 mg total) by mouth daily. PT OVERDUE FOR OV PLEASE CALL FOR APPT 09/23/17   Pixie Casino, MD  aspirin 81 MG tablet Take 81 mg by mouth daily.    [provider]  benzonatate (TESSALON) 100 MG capsule Take 1 capsule by mouth 3  (three) times daily as needed for cough. 08/13/16   [provider]  capsaicin (ZOSTRIX) 0.025 % cream Apply 1 application daily as needed topically.    [provider]  diazepam (VALIUM) 5 MG tablet Take 1 tablet (5 mg total) 2 (two) times daily by mouth. 05/13/17   Mabe, Forbes Cellar, MD  EPINEPHrine (EPIPEN 2-PAK) 0.3 mg/0.3 mL DEVI Inject 0.3 mLs (0.3 mg total) into the muscle once as needed (for severe allergic reaction). CAll 911 immediately if you have to use this medicine 11/26/12   Piepenbrink, Anderson Malta, PA-C  gabapentin (NEURONTIN) 300 MG capsule Take 300 mg by mouth at bedtime. 08/08/16   [provider]  LORazepam (ATIVAN) 1 MG tablet Take 1 mg by mouth daily as needed for anxiety.    [provider]  losartan-hydrochlorothiazide (HYZAAR) 100-12.5 MG per tablet Take 1 tablet by mouth daily.    [provider]  metoprolol tartrate (LOPRESSOR) 25 MG tablet TAKE 0.5 TABLETS (12.5 MG TOTAL) BY MOUTH 2 (TWO) TIMES DAILY. 09/24/17   Hilty, Nadean Corwin, MD  montelukast (SINGULAIR) 10 MG tablet Take 10 mg by mouth at bedtime.    [provider]  nitroGLYCERIN (NITROSTAT) 0.4 MG SL tablet Place 1 tablet (0.4 mg total) under the tongue every 5 (five) minutes x 3 doses as needed for chest pain. 04/01/13   Erlene Quan, PA-C  traMADol (ULTRAM) 50 MG tablet Take 50 mg 3 (three) times daily by mouth. 04/25/17   [provider]    Family History Family History  Problem Relation Age of Onset  . Hypertension Mother   . Diabetes Mother   . Cancer - Other Mother   . Cancer Sister     Social History Social History   Tobacco Use  . Smoking status: Current Every Day Smoker    Packs/day: 0.40    Years: 25.00    Pack years: 10.00    Types: Cigarettes  . Smokeless tobacco: Never Used  . Tobacco comment: Currently on the Nicoderm patch."smokes a few"  Substance Use Topics  . Alcohol use: Yes    Comment: 1 glass of wine occas. (1 every 2 weeks or  so)  . Drug use: No     Allergies   Diclofenac; Codeine; and Hydrocodone   Review of Systems Review of Systems  Respiratory: Negative.   Cardiovascular: Negative.   Gastrointestinal: Negative for abdominal pain.  Genitourinary: Positive for frequency and hematuria. Negative for decreased urine volume, difficulty urinating, dysuria, enuresis, flank pain, genital sores, pelvic pain, urgency, vaginal bleeding, vaginal discharge and vaginal pain.  All other systems reviewed and are negative.    Physical Exam Triage Vital Signs ED Triage Vitals [11/01/17 1905]  Enc Vitals Group  BP 128/65     Pulse Rate 72     Resp 18     Temp 98.1 F (36.7 C)     Temp src      SpO2 96 %     Weight      Height      Head Circumference      Peak Flow      Pain Score 0     Pain Loc      Pain Edu?      Excl. in [place]?    No data found.  Updated Vital Signs BP 128/65   Pulse 72   Temp 98.1 F (36.7 C)   Resp 18   SpO2 96%   Visual Acuity Right Eye Distance:   Left Eye Distance:   Bilateral Distance:    Right Eye Near:   Left Eye Near:    Bilateral Near:     Physical Exam  Constitutional: She appears well-developed and well-nourished.  Cardiovascular: Normal rate and regular rhythm.  Pulmonary/Chest: Effort normal and breath sounds normal. No respiratory distress. She has no wheezes.  Abdominal: Soft. Bowel sounds are normal. She exhibits no distension and no mass. There is no tenderness. There is no rebound and no guarding. No hernia.  Nursing note and vitals reviewed.    UC Treatments / Results  Labs (all labs ordered are listed, but only abnormal results are displayed) Labs Reviewed  POCT URINALYSIS DIP (DEVICE) - Abnormal; Notable for the following components:      Result Value   Hgb urine dipstick LARGE (*)    All other components within normal limits    EKG None  Radiology No results found.  Procedures Procedures (including critical care  time)  Medications Ordered in UC Medications - No data to display  Initial Impression / Assessment and Plan / UC Course  I have reviewed the triage vital signs and the nursing notes.  Pertinent labs & imaging results that were available during my care of the patient were reviewed by me and considered in my medical decision making (see chart for details).  Clinical Course as of Nov 01 1936  Fri Nov 01, 2017  6734 Macroscopic Hematuria with Increase urine frequency. UA showed large blood, no nitrite or leukocyte. Urine sent for culture to r/o infection. I discussed referral to urologist if culture is negative and hematuria persists for cystoscopic evaluation. See PCP in 3-5 days for reassessment and referral. She agreed with plan   [KE]    Clinical Course User Index [KE] Kinnie Feil, MD    Hematuria, unspecified type  Frequency of urination   Final Clinical Impressions(s) / UC Diagnoses   Final diagnoses:  None   Discharge Instructions   None    ED Prescriptions    None     Controlled Substance Prescriptions [place] Controlled Substance Registry consulted? Not Applicable   Kinnie Feil, MD 11/01/17 [phone]

## 2017-11-01 NOTE — Discharge Instructions (Addendum)
It was nice seeing you today. Your urine test showed blood. We will treat you for infection while waiting on your urine culture. If culture is negative, please see PCP in about 3-5 day for recheck and referral to a urologist for further evaluation and treatment. Go to the ED if you see clot in your urine, worsening of your hematuria or pain.

## 2017-11-03 LAB — URINE CULTURE: Culture: NO GROWTH

## 2017-11-04 ENCOUNTER — Telehealth: Payer: Self-pay | Admitting: Family Medicine

## 2017-11-04 NOTE — Telephone Encounter (Signed)
HIPPA compliant callback message left.     Note: Urine culture is neg. F/U with PCP and Urologist as recommended.

## 2017-11-11 ENCOUNTER — Other Ambulatory Visit: Payer: Self-pay | Admitting: Family Medicine

## 2017-11-11 DIAGNOSIS — R310 Gross hematuria: Secondary | ICD-10-CM

## 2017-11-22 ENCOUNTER — Ambulatory Visit
Admission: RE | Admit: 2017-11-22 | Discharge: 2017-11-22 | Disposition: A | Discharge status: Home / Self Care | Payer: Medicare HMO | Source: Ambulatory Visit | Attending: Family Medicine | Admitting: Family Medicine

## 2017-11-22 DIAGNOSIS — R310 Gross hematuria: Secondary | ICD-10-CM

## 2017-12-20 ENCOUNTER — Other Ambulatory Visit: Payer: Self-pay | Admitting: Internal Medicine

## 2018-01-07 ENCOUNTER — Other Ambulatory Visit: Payer: Self-pay | Admitting: Internal Medicine

## 2018-01-17 ENCOUNTER — Ambulatory Visit (HOSPITAL_COMMUNITY)
Admission: EM | Admit: 2018-01-17 | Discharge: 2018-01-17 | Disposition: A | Discharge status: Home / Self Care | Payer: Medicare HMO | Attending: Family Medicine | Admitting: Family Medicine

## 2018-01-17 ENCOUNTER — Other Ambulatory Visit: Payer: Self-pay

## 2018-01-17 ENCOUNTER — Encounter (HOSPITAL_COMMUNITY): Payer: Self-pay

## 2018-01-17 DIAGNOSIS — R110 Nausea: Secondary | ICD-10-CM | POA: Diagnosis not present

## 2018-01-17 LAB — POCT I-STAT, CHEM 8
BUN: 15 mg/dL (ref 8–23)
CALCIUM ION: 1.14 mmol/L — AB (ref 1.15–1.40)
Chloride: 98 mmol/L (ref 98–111)
Creatinine, Ser: 1.1 mg/dL — ABNORMAL HIGH (ref 0.44–1.00)
GLUCOSE: 99 mg/dL (ref 70–99)
HCT: 46 % (ref 36.0–46.0)
Hemoglobin: 15.6 g/dL — ABNORMAL HIGH (ref 12.0–15.0)
Potassium: 4.5 mmol/L (ref 3.5–5.1)
SODIUM: 137 mmol/L (ref 135–145)
TCO2: 28 mmol/L (ref 22–32)

## 2018-01-17 LAB — POCT URINALYSIS DIP (DEVICE)
Glucose, UA: NEGATIVE mg/dL
HGB URINE DIPSTICK: NEGATIVE
Ketones, ur: NEGATIVE mg/dL
Leukocytes, UA: NEGATIVE
NITRITE: NEGATIVE
PROTEIN: NEGATIVE mg/dL
Specific Gravity, Urine: 1.025 (ref 1.005–1.030)
Urobilinogen, UA: 0.2 mg/dL (ref 0.0–1.0)
pH: 5 (ref 5.0–8.0)

## 2018-01-17 MED ORDER — ONDANSETRON HCL 8 MG PO TABS: 8.0000 mg | ORAL_TABLET | Freq: Three times a day (TID) | ORAL | 0 refills | Status: DC | PRN

## 2018-01-17 MED ORDER — ONDANSETRON 4 MG PO TBDP
ORAL_TABLET | ORAL | Status: AC
  Filled 2018-01-17: qty 1

## 2018-01-17 MED ORDER — ONDANSETRON 4 MG PO TBDP
4.0000 mg | ORAL_TABLET | Freq: Once | ORAL | Status: AC
  Administered 2018-01-17: 4 mg via ORAL

## 2018-01-17 NOTE — ED Triage Notes (Addendum)
Patient presents to [facility] for loss of appetite and nausea x7 days. Pt also complains of weakness. Pt has taken Pepto Bismol, but has no relief

## 2018-01-17 NOTE — ED Provider Notes (Signed)
Morgan Torres    CSN: [id number] Arrival date & time: 01/17/18  1753     History   Chief Complaint Chief Complaint  Patient presents with  . Anorexia  . Nausea    HPI Morgan Torres is a 67 y.o. female.   HPI  Patient is here for nausea.  She has not felt like eating this week.  She has been drinking fluids.  She does not want solid foods.  She had a couple vomiting episodes but not very many.  She is had normal bowel movements.  She had a bowel movement yesterday.  No fever or chills.  No abdominal pain.  She thinks she may have eaten something on Sunday that did not agree with her, the symptoms started Monday.  She does not have ulcer disease or GERD.  She does not have acid reflux or heartburn.  She states that she has not eaten this week she feels a little bit weak and tired.  She says she thinks she is lost a couple pounds.  She is taking Pepto-Bismol, this is made her stool black but has not helped with the nausea.  No change in medications.  She does not drink alcohol.  She does not take ibuprofen or NSAID drugs.  She takes baby aspirin once a day as prescribed by her personal physician.  No recent illness.  Past Medical History:  Diagnosis Date  . Anxiety   . Arthritis    hip, lumbar spine   . Asthma    seasonal allergies   . Bronchitis    Hx: of  . COPD (chronic obstructive pulmonary disease) ([place])   . Coronary artery disease   . Depression   . Fibromyalgia   . Hypertension   . Lumbar herniated disc   . Myocardial infarction [facility]) 2014   followed by Dr. Debara Pickett, treated medically, no stents  . Pneumonia    Hx: of  . Sciatica   . Small bowel obstruction [facility])     Patient Active Problem List   Diagnosis Date Noted  . Morbid obesity, unspecified obesity type ([place]) 08/28/2016  . Non-restorative sleep 08/28/2016  . Snoring 08/28/2016  . Hip pain 06/03/2014  . Acute blood loss anemia 06/01/2014  . Osteoarthritis of left hip 05/28/2014  . Obesity (BMI  30.0-34.9) 04/20/2013  . Nonischemic cardiomyopathy ([place]) 04/01/2013  . Smoking 04/01/2013  . NSTEMI - Troponin pk 1.5 with ant TWI on EKG 03/29/2013  . Spinal stenosis, lumbar region, with neurogenic claudication 01/23/2013  . VAGINAL TRICHOMONIASIS 06/28/2006  . Hyperlipidemia 06/28/2006  . ANEMIA-NOS 06/28/2006  . ANXIETY 06/28/2006  . DEPRESSION 06/28/2006  . SYNDROME, CARPAL TUNNEL 06/28/2006  . Essential hypertension 06/28/2006  . ALLERGIC RHINITIS 06/28/2006  . ASTHMA 06/28/2006  . PAROTIDITIS 06/28/2006  . LOW BACK PAIN 06/28/2006  . TB SKIN TEST, POSITIVE, HX OF 06/28/2006    Past Surgical History:  Procedure Laterality Date  . ABDOMINAL HYSTERECTOMY    . ABDOMINAL SURGERY     resection of small intestine  . BACK SURGERY     fusion  . CARDIAC CATHETERIZATION  2014  . COLON SURGERY     resection for bowel - for obstruction   . COLONOSCOPY     Hx; of  . DILATION AND CURETTAGE OF UTERUS    . HERNIA REPAIR  1970's   inguinal hernia   . LEFT HEART CATHETERIZATION WITH CORONARY ANGIOGRAM N/A 03/30/2013   Procedure: LEFT HEART CATHETERIZATION WITH CORONARY ANGIOGRAM;  Surgeon: Shanon Brow  Loren Racer, MD;  Location: [facility] CATH LAB;  Service: Cardiovascular;  Laterality: N/A;  . SALIVARY GLAND SURGERY Left 1970-1980   approached from inside mouth & side of neck  . TONSILLECTOMY    . TOTAL HIP ARTHROPLASTY Left 05/28/2014   Procedure: LEFT TOTAL HIP ARTHROPLASTY;  Surgeon: Yvette Rack., MD;  Location: [place];  Service: Orthopedics;  Laterality: Left;  . TOTAL HIP ARTHROPLASTY Left 05/28/2014   dr caffrey    OB History   None      Home Medications    Prior to Admission medications   Medication Sig Start Date End Date Taking? Authorizing Provider  amLODipine (NORVASC) 5 MG tablet Take 1 tablet (5 mg total) by mouth daily. 12/23/17  Yes Troy Sine, MD  amLODipine (NORVASC) 5 MG tablet Take 1 tablet (5 mg total) by mouth daily. 01/07/18  Yes Troy Sine, MD  aspirin 81  MG tablet Take 81 mg by mouth daily.   Yes [provider]  diazepam (VALIUM) 5 MG tablet Take 1 tablet (5 mg total) 2 (two) times daily by mouth. 05/13/17  Yes Mabe, Forbes Cellar, MD  gabapentin (NEURONTIN) 300 MG capsule Take 300 mg by mouth at bedtime. 08/08/16  Yes [provider]  LORazepam (ATIVAN) 1 MG tablet Take 1 mg by mouth daily as needed for anxiety.   Yes [provider]  losartan-hydrochlorothiazide (HYZAAR) 100-12.5 MG per tablet Take 1 tablet by mouth daily.   Yes [provider]  metoprolol tartrate (LOPRESSOR) 25 MG tablet TAKE 0.5 TABLETS (12.5 MG TOTAL) BY MOUTH 2 (TWO) TIMES DAILY. 09/24/17  Yes Hilty, Nadean Corwin, MD  acetaminophen (TYLENOL) 500 MG tablet Take 1,000 mg every 6 (six) hours as needed by mouth for mild pain.    [provider]  albuterol (PROVENTIL HFA;VENTOLIN HFA) 108 (90 Base) MCG/ACT inhaler Inhale 2 puffs into the lungs every 6 (six) hours as needed for wheezing or shortness of breath. 05/10/16   Katy Apo, NP  benzonatate (TESSALON) 100 MG capsule Take 1 capsule by mouth 3 (three) times daily as needed for cough. 08/13/16   [provider]  capsaicin (ZOSTRIX) 0.025 % cream Apply 1 application daily as needed topically.    [provider]  EPINEPHrine (EPIPEN 2-PAK) 0.3 mg/0.3 mL DEVI Inject 0.3 mLs (0.3 mg total) into the muscle once as needed (for severe allergic reaction). CAll 911 immediately if you have to use this medicine 11/26/12   Piepenbrink, Anderson Malta, PA-C  montelukast (SINGULAIR) 10 MG tablet Take 10 mg by mouth at bedtime.    [provider]  nitroGLYCERIN (NITROSTAT) 0.4 MG SL tablet Place 1 tablet (0.4 mg total) under the tongue every 5 (five) minutes x 3 doses as needed for chest pain. 04/01/13   Erlene Quan, PA-C  ondansetron (ZOFRAN) 8 MG tablet Take 1 tablet (8 mg total) by mouth every 8 (eight) hours as needed for nausea or vomiting. 01/17/18   Raylene Everts, MD    traMADol (ULTRAM) 50 MG tablet Take 50 mg 3 (three) times daily by mouth. 04/25/17   [provider]    Family History Family History  Problem Relation Age of Onset  . Hypertension Mother   . Diabetes Mother   . Cancer - Other Mother   . Cancer Sister     Social History Social History   Tobacco Use  . Smoking status: Current Every Day Smoker    Packs/day: 0.40    Years:  25.00    Pack years: 10.00    Types: Cigarettes  . Smokeless tobacco: Never Used  . Tobacco comment: Currently on the Nicoderm patch."smokes a few"  Substance Use Topics  . Alcohol use: Yes    Comment: 1 glass of wine occas. (1 every 2 weeks or so)  . Drug use: No     Allergies   Diclofenac; Codeine; and Hydrocodone   Review of Systems Review of Systems  Constitutional: Positive for activity change, appetite change and unexpected weight change. Negative for chills and fever.  HENT: Negative for ear pain and sore throat.   Eyes: Negative for pain and visual disturbance.  Respiratory: Negative for cough and shortness of breath.   Cardiovascular: Negative for chest pain and palpitations.  Gastrointestinal: Positive for nausea. Negative for abdominal pain, constipation, diarrhea and vomiting.  Genitourinary: Negative for dysuria and hematuria.  Musculoskeletal: Negative for arthralgias and back pain.  Skin: Negative for color change and rash.  Neurological: Negative for seizures and syncope.  All other systems reviewed and are negative.    Physical Exam Triage Vital Signs ED Triage Vitals  Enc Vitals Group     BP 01/17/18 1800 (!) 150/69     Pulse Rate 01/17/18 1800 87     Resp 01/17/18 1800 16     Temp 01/17/18 1800 98.3 F (36.8 C)     Temp Source 01/17/18 1800 Oral     SpO2 01/17/18 1800 97 %     Weight --      Height --      Head Circumference --      Peak Flow --      Pain Score 01/17/18 1850 0     Pain Loc --      Pain Edu? --      Excl. in [place]? --    No data  found.  Updated Vital Signs BP (!) 150/69 (BP Location: Left Arm)   Pulse 87   Temp 98.3 F (36.8 C) (Oral)   Resp 16   SpO2 98%   Visual Acuity Right Eye Distance:   Left Eye Distance:   Bilateral Distance:    Right Eye Near:   Left Eye Near:    Bilateral Near:     Physical Exam  Constitutional: She appears well-developed and well-nourished. No distress.  Appears tired.  No active distress  HENT:  Head: Normocephalic and atraumatic.  Mouth/Throat: Oropharynx is clear and moist.  Eyes: Pupils are equal, round, and reactive to light. Conjunctivae are normal.  Neck: Normal range of motion.  Cardiovascular: Normal rate and regular rhythm.  Pulmonary/Chest: Effort normal and breath sounds normal. No respiratory distress.  Abdominal: Soft. Bowel sounds are normal. She exhibits no distension.  No abdominal tenderness.  Active bowel sounds.  No hepatosplenomegaly  Musculoskeletal: Normal range of motion. She exhibits no edema.  Trace edema.  Normal for patient  Neurological: She is alert.  Skin: Skin is warm and dry.  Psychiatric: She has a normal mood and affect. Her behavior is normal.     UC Treatments / Results  Labs (all labs ordered are listed, but only abnormal results are displayed) Labs Reviewed  POCT URINALYSIS DIP (DEVICE) - Abnormal; Notable for the following components:      Result Value   Bilirubin Urine SMALL (*)    All other components within normal limits  POCT I-STAT, CHEM 8 - Abnormal; Notable for the following components:   Creatinine, Ser 1.10 (*)    Calcium,  Ion 1.14 (*)    Hemoglobin 15.6 (*)    All other components within normal limits    EKG None  Radiology No results found.  Procedures Procedures (including critical care time)  Medications Ordered in UC Medications  ondansetron (ZOFRAN-ODT) disintegrating tablet 4 mg (4 mg Oral Given 01/17/18 1923)    Initial Impression / Assessment and Plan / UC Course  I have reviewed the triage  vital signs and the nursing notes.  Pertinent labs & imaging results that were available during my care of the patient were reviewed by me and considered in my medical decision making (see chart for details).      cHem  8 and UA are reassuring.  Patient is given Zofran.  She feels slightly better.  I reassured her that I do not see anything serious causing the nausea.  She may have eaten something did not agree with her.  May have a mild stomach flu.  She will follow-up with her physician next week. Final Clinical Impressions(s) / UC Diagnoses   Final diagnoses:  Nausea     Discharge Instructions     Plenty of fluids. Bland diet. Take Zofran for the nausea. Return if you feel worse, fever or chills, increasing vomiting, diarrhea, or pain Call your family doctor on Monday for follow-up    ED Prescriptions    Medication Sig Dispense Auth. Provider   ondansetron (ZOFRAN) 8 MG tablet Take 1 tablet (8 mg total) by mouth every 8 (eight) hours as needed for nausea or vomiting. 20 tablet Raylene Everts, MD     Controlled Substance Prescriptions [place] Controlled Substance Registry consulted? Not Applicable   Raylene Everts, MD 01/17/18 2004

## 2018-01-17 NOTE — Discharge Instructions (Signed)
Plenty of fluids. Bland diet. Take Zofran for the nausea. Return if you feel worse, fever or chills, increasing vomiting, diarrhea, or pain Call your family doctor on Monday for follow-up

## 2018-04-17 ENCOUNTER — Other Ambulatory Visit: Payer: Self-pay | Admitting: Internal Medicine

## 2018-04-18 ENCOUNTER — Ambulatory Visit (INDEPENDENT_AMBULATORY_CARE_PROVIDER_SITE_OTHER): Payer: Medicare HMO

## 2018-04-18 ENCOUNTER — Ambulatory Visit (HOSPITAL_COMMUNITY)
Admission: EM | Admit: 2018-04-18 | Discharge: 2018-04-18 | Disposition: A | Discharge status: Home / Self Care | Payer: Medicare HMO | Attending: Emergency Medicine | Admitting: Emergency Medicine

## 2018-04-18 ENCOUNTER — Other Ambulatory Visit: Payer: Self-pay

## 2018-04-18 DIAGNOSIS — R109 Unspecified abdominal pain: Secondary | ICD-10-CM | POA: Diagnosis not present

## 2018-04-18 DIAGNOSIS — R1084 Generalized abdominal pain: Secondary | ICD-10-CM

## 2018-04-18 DIAGNOSIS — R112 Nausea with vomiting, unspecified: Secondary | ICD-10-CM | POA: Diagnosis not present

## 2018-04-18 MED ORDER — ONDANSETRON HCL 4 MG PO TABS: 4.0000 mg | ORAL_TABLET | Freq: Four times a day (QID) | ORAL | 0 refills | Status: DC

## 2018-04-18 NOTE — ED Triage Notes (Signed)
Pt states she took some naproxen on Sunday pt states she has been vomiting  And has been constipated x 4 days.

## 2018-04-18 NOTE — ED Provider Notes (Signed)
[place]   [id number] 04/18/18 Arrival Time: 1815  CC: ABDOMINAL DISCOMFORT  SUBJECTIVE:  Morgan Torres is a 67 y.o. female who presents with complaint of abdominal discomfort that began 5 days ago.  States symptoms began after taking naproxen for knee pain.  Pain is diffuse about the abdomen.  Describes as intermittent, improving and dull/achy in character.  Has tried OTC medications including an antacid, and milk of magnesia with relief.  Denies alleviating or aggravating factors. Denies association with eating. Denies similar symptoms in the past.  Last BM this morning and smaller than usual.  Complains of associated decreased appetite, nausea, and 2 episodes of NB/NB emesis over the past 2 days.  States she is passing gas without difficulty.  Denies fever, chills, chest pain, SOB, diarrhea, constipation, hematochezia, melena, dysuria, difficulty urinating, increased frequency or urgency, flank pain, loss of bowel or bladder function.  No LMP recorded. Patient has had a hysterectomy.   Patient hx significant for SBO x 2, but her symptoms today are not similar.    ROS: As per HPI.  Past Medical History:  Diagnosis Date  . Anxiety   . Arthritis    hip, lumbar spine   . Asthma    seasonal allergies   . Bronchitis    Hx: of  . COPD (chronic obstructive pulmonary disease) ([place])   . Coronary artery disease   . Depression   . Fibromyalgia   . Hypertension   . Lumbar herniated disc   . Myocardial infarction [facility]) 2014   followed by Dr. Debara Pickett, treated medically, no stents  . Pneumonia    Hx: of  . Sciatica   . Small bowel obstruction [facility])    Past Surgical History:  Procedure Laterality Date  . ABDOMINAL HYSTERECTOMY    . ABDOMINAL SURGERY     resection of small intestine  . BACK SURGERY     fusion  . CARDIAC CATHETERIZATION  2014  . COLON SURGERY     resection for bowel - for obstruction   . COLONOSCOPY     Hx; of  . DILATION AND CURETTAGE OF UTERUS      . HERNIA REPAIR  1970's   inguinal hernia   . LEFT HEART CATHETERIZATION WITH CORONARY ANGIOGRAM N/A 03/30/2013   Procedure: LEFT HEART CATHETERIZATION WITH CORONARY ANGIOGRAM;  Surgeon: Leonie Man, MD;  Location: [facility] CATH LAB;  Service: Cardiovascular;  Laterality: N/A;  . SALIVARY GLAND SURGERY Left 1970-1980   approached from inside mouth & side of neck  . TONSILLECTOMY    . TOTAL HIP ARTHROPLASTY Left 05/28/2014   Procedure: LEFT TOTAL HIP ARTHROPLASTY;  Surgeon: Yvette Rack., MD;  Location: [place];  Service: Orthopedics;  Laterality: Left;  . TOTAL HIP ARTHROPLASTY Left 05/28/2014   dr caffrey   Allergies  Allergen Reactions  . Diclofenac Anaphylaxis  . Codeine Itching  . Hydrocodone Itching   No current facility-administered medications on file prior to encounter.    Current Outpatient Medications on File Prior to Encounter  Medication Sig Dispense Refill  . acetaminophen (TYLENOL) 500 MG tablet Take 1,000 mg every 6 (six) hours as needed by mouth for mild pain.    Marland Kitchen albuterol (PROVENTIL HFA;VENTOLIN HFA) 108 (90 Base) MCG/ACT inhaler Inhale 2 puffs into the lungs every 6 (six) hours as needed for wheezing or shortness of breath. 1 Inhaler 0  . amLODipine (NORVASC) 5 MG tablet Take 1 tablet (5 mg total) by mouth daily. 90 tablet 0  .  amLODipine (NORVASC) 5 MG tablet Take 1 tablet (5 mg total) by mouth daily. 90 tablet 0  . aspirin 81 MG tablet Take 81 mg by mouth daily.    . benzonatate (TESSALON) 100 MG capsule Take 1 capsule by mouth 3 (three) times daily as needed for cough.  0  . capsaicin (ZOSTRIX) 0.025 % cream Apply 1 application daily as needed topically.    . diazepam (VALIUM) 5 MG tablet Take 1 tablet (5 mg total) 2 (two) times daily by mouth. 10 tablet 0  . EPINEPHrine (EPIPEN 2-PAK) 0.3 mg/0.3 mL DEVI Inject 0.3 mLs (0.3 mg total) into the muscle once as needed (for severe allergic reaction). CAll 911 immediately if you have to use this medicine 1 Device 1  .  gabapentin (NEURONTIN) 300 MG capsule Take 300 mg by mouth at bedtime.  2  . LORazepam (ATIVAN) 1 MG tablet Take 1 mg by mouth daily as needed for anxiety.    Marland Kitchen losartan-hydrochlorothiazide (HYZAAR) 100-12.5 MG per tablet Take 1 tablet by mouth daily.    . metoprolol tartrate (LOPRESSOR) 25 MG tablet Take 0.5 tablets (12.5 mg total) by mouth 2 (two) times daily. Please schedule yearly appt for more refills, thanks! 1st attmpt 30 tablet 0  . montelukast (SINGULAIR) 10 MG tablet Take 10 mg by mouth at bedtime.    . nitroGLYCERIN (NITROSTAT) 0.4 MG SL tablet Place 1 tablet (0.4 mg total) under the tongue every 5 (five) minutes x 3 doses as needed for chest pain. 25 tablet 2  . traMADol (ULTRAM) 50 MG tablet Take 50 mg 3 (three) times daily by mouth.  1   Social History   Socioeconomic History  . Marital status: Divorced    Spouse name: Not on file  . Number of children: Not on file  . Years of education: Not on file  . Highest education level: Not on file  Occupational History  . Not on file  Social Needs  . Financial resource strain: Not on file  . Food insecurity:    Worry: Not on file    Inability: Not on file  . Transportation needs:    Medical: Not on file    Non-medical: Not on file  Tobacco Use  . Smoking status: Current Every Day Smoker    Packs/day: 0.40    Years: 25.00    Pack years: 10.00    Types: Cigarettes  . Smokeless tobacco: Never Used  . Tobacco comment: Currently on the Nicoderm patch."smokes a few"  Substance and Sexual Activity  . Alcohol use: Yes    Comment: 1 glass of wine occas. (1 every 2 weeks or so)  . Drug use: No  . Sexual activity: Not Currently  Lifestyle  . Physical activity:    Days per week: Not on file    Minutes per session: Not on file  . Stress: Not on file  Relationships  . Social connections:    Talks on phone: Not on file    Gets together: Not on file    Attends religious service: Not on file    Active member of club or  organization: Not on file    Attends meetings of clubs or organizations: Not on file    Relationship status: Not on file  . Intimate partner violence:    Fear of current or ex partner: Not on file    Emotionally abused: Not on file    Physically abused: Not on file    Forced sexual activity: Not  on file  Other Topics Concern  . Not on file  Social History Narrative  . Not on file   Family History  Problem Relation Age of Onset  . Hypertension Mother   . Diabetes Mother   . Cancer - Other Mother   . Cancer Sister      OBJECTIVE:  Vitals:   04/18/18 1842 04/18/18 1844  BP: 140/60   Pulse: 74   Resp: 18   Temp: 98.1 F (36.7 C)   SpO2: 100%   Weight:  217 lb (98.4 kg)    General appearance: AOx3 in no acute distress; nontoxic appearance HEENT: NCAT.  Oropharynx clear.  Lungs: clear to auscultation bilaterally without adventitious breath sounds Heart: regular rate and rhythm.  Radial pulses 2+ symmetrical bilaterally Abdomen: soft, non-distended; normal active bowel sounds; mild diffuse tender about the abdomen; nontender at McBurney's point; negative Murphy's sign; no guarding Back: no CVA tenderness Extremities: no edema; symmetrical with no gross deformities Skin: warm and dry Neurologic: normal gait Psychological: alert and cooperative; normal mood and affect  Imaging: Dg Abd 2 Views  Result Date: 04/18/2018 CLINICAL DATA:  Abdominal pain with nausea and vomiting EXAM: ABDOMEN - 2 VIEW COMPARISON:  Radiograph 05/28/2014 FINDINGS: Lung bases are clear. No free air beneath the diaphragm. Scoliosis of the spine. Postsurgical changes in the lower lumbar spine and left hip. Overall nonobstructed gas pattern. Scattered fluid levels on upright view, mostly over the colon. Moderate stool. IMPRESSION: Overall nonobstructed gas pattern. Scattered fluid levels on the upright exam suggesting possible ileus or enteritis. Electronically Signed   By: Donavan Foil M.D.   On:  04/18/2018 20:13    ASSESSMENT & PLAN:  1. Generalized abdominal pain   2. Non-intractable vomiting with nausea, unspecified vomiting type     Meds ordered this encounter  Medications  . ondansetron (ZOFRAN) 4 MG tablet    Sig: Take 1 tablet (4 mg total) by mouth every 6 (six) hours.    Dispense:  5 tablet    Refill:  0    Order Specific Question:   Supervising Provider    Answer:   OLUWATENIOLA, LEITCH [[id number]]   X-rays showed possible ileus Recommended further evaluation and management at the ED.  Patient declines.   Zofran prescribed.  Take as needed for nausea Recommend clear liquid diet for the next 12 hours.   If symptoms do not improve in the next 12 hours please go to the ED for further evaluation and management of potential ileus   Reviewed expectations re: course of current medical issues. Questions answered. Outlined signs and symptoms indicating need for more acute intervention. Patient verbalized understanding. After Visit Summary given.   Lestine Box, PA-C 04/18/18 2037

## 2018-04-18 NOTE — Discharge Instructions (Signed)
X-rays showed possible ileus Recommended further evaluation and management at the ED.  Patient declines.   Zofran prescribed.  Take as needed for nausea Recommend clear liquid diet for the next 12 hours.   If symptoms do not improve in the next 12 hours please go to the ED for further evaluation and management of potential ileus

## 2018-04-18 NOTE — ED Notes (Signed)
Instructed to get into a gown

## 2018-06-01 ENCOUNTER — Other Ambulatory Visit: Payer: Self-pay | Admitting: Internal Medicine

## 2018-06-03 NOTE — Telephone Encounter (Signed)
Rx(s) sent to pharmacy electronically.  

## 2018-07-09 ENCOUNTER — Encounter (HOSPITAL_COMMUNITY): Payer: Self-pay | Admitting: Family Medicine

## 2018-07-09 ENCOUNTER — Ambulatory Visit (HOSPITAL_COMMUNITY)
Admission: EM | Admit: 2018-07-09 | Discharge: 2018-07-09 | Disposition: A | Discharge status: Home / Self Care | Payer: Medicare Other | Attending: Family Medicine | Admitting: Family Medicine

## 2018-07-09 DIAGNOSIS — J40 Bronchitis, not specified as acute or chronic: Secondary | ICD-10-CM | POA: Diagnosis not present

## 2018-07-09 MED ORDER — PREDNISONE 50 MG PO TABS: ORAL_TABLET | ORAL | 0 refills | Status: DC

## 2018-07-09 MED ORDER — BENZONATATE 100 MG PO CAPS: 100.0000 mg | ORAL_CAPSULE | Freq: Three times a day (TID) | ORAL | 0 refills | Status: DC | PRN

## 2018-07-09 NOTE — ED Triage Notes (Signed)
Pt here for cough and URI sx

## 2018-07-09 NOTE — ED Provider Notes (Signed)
[place]    CSN: [id number] Arrival date & time: 07/09/18  1649     History   Chief Complaint Chief Complaint  Patient presents with  . Cough    HPI Morgan Torres is a 68 y.o. female.   This is a 68 year old woman who has been to this facility, [name].  [facility] urgent care, previously.  She comes in today with complaints of cough and upper respiratory symptoms.  Patient's past medical history is significant for hypertension.  She is allergic to codeine and hydrocodone.     Past Medical History:  Diagnosis Date  . Anxiety   . Arthritis    hip, lumbar spine   . Asthma    seasonal allergies   . Bronchitis    Hx: of  . COPD (chronic obstructive pulmonary disease) ([place])   . Coronary artery disease   . Depression   . Fibromyalgia   . Hypertension   . Lumbar herniated disc   . Myocardial infarction [facility]) 2014   followed by Dr. Debara Pickett, treated medically, no stents  . Pneumonia    Hx: of  . Sciatica   . Small bowel obstruction [facility])     Patient Active Problem List   Diagnosis Date Noted  . Morbid obesity, unspecified obesity type ([place]) 08/28/2016  . Non-restorative sleep 08/28/2016  . Snoring 08/28/2016  . Hip pain 06/03/2014  . Acute blood loss anemia 06/01/2014  . Osteoarthritis of left hip 05/28/2014  . Obesity (BMI 30.0-34.9) 04/20/2013  . Nonischemic cardiomyopathy ([place]) 04/01/2013  . Smoking 04/01/2013  . NSTEMI - Troponin pk 1.5 with ant TWI on EKG 03/29/2013  . Spinal stenosis, lumbar region, with neurogenic claudication 01/23/2013  . VAGINAL TRICHOMONIASIS 06/28/2006  . Hyperlipidemia 06/28/2006  . ANEMIA-NOS 06/28/2006  . ANXIETY 06/28/2006  . DEPRESSION 06/28/2006  . SYNDROME, CARPAL TUNNEL 06/28/2006  . Essential hypertension 06/28/2006  . ALLERGIC RHINITIS 06/28/2006  . ASTHMA 06/28/2006  . PAROTIDITIS 06/28/2006  . LOW BACK PAIN 06/28/2006  . TB SKIN TEST, POSITIVE, HX OF 06/28/2006    Past Surgical History:   Procedure Laterality Date  . ABDOMINAL HYSTERECTOMY    . ABDOMINAL SURGERY     resection of small intestine  . BACK SURGERY     fusion  . CARDIAC CATHETERIZATION  2014  . COLON SURGERY     resection for bowel - for obstruction   . COLONOSCOPY     Hx; of  . DILATION AND CURETTAGE OF UTERUS    . HERNIA REPAIR  1970's   inguinal hernia   . LEFT HEART CATHETERIZATION WITH CORONARY ANGIOGRAM N/A 03/30/2013   Procedure: LEFT HEART CATHETERIZATION WITH CORONARY ANGIOGRAM;  Surgeon: Leonie Man, MD;  Location: [facility] CATH LAB;  Service: Cardiovascular;  Laterality: N/A;  . SALIVARY GLAND SURGERY Left 1970-1980   approached from inside mouth & side of neck  . TONSILLECTOMY    . TOTAL HIP ARTHROPLASTY Left 05/28/2014   Procedure: LEFT TOTAL HIP ARTHROPLASTY;  Surgeon: Yvette Rack., MD;  Location: [place];  Service: Orthopedics;  Laterality: Left;  . TOTAL HIP ARTHROPLASTY Left 05/28/2014   dr caffrey    OB History   No obstetric history on file.      Home Medications    Prior to Admission medications   Medication Sig Start Date End Date Taking? Authorizing Provider  acetaminophen (TYLENOL) 500 MG tablet Take 1,000 mg every 6 (six) hours as needed by mouth for mild pain.  [provider]  albuterol (PROVENTIL HFA;VENTOLIN HFA) 108 (90 Base) MCG/ACT inhaler Inhale 2 puffs into the lungs every 6 (six) hours as needed for wheezing or shortness of breath. 05/10/16   Katy Apo, NP  amLODipine (NORVASC) 5 MG tablet Take 1 tablet (5 mg total) by mouth daily. 12/23/17   Troy Sine, MD  amLODipine (NORVASC) 5 MG tablet Take 1 tablet (5 mg total) by mouth daily. 01/07/18   Troy Sine, MD  aspirin 81 MG tablet Take 81 mg by mouth daily.    [provider]  benzonatate (TESSALON) 100 MG capsule Take 1-2 capsules (100-200 mg total) by mouth 3 (three) times daily as needed for cough. 07/09/18   Robyn Haber, MD  capsaicin (ZOSTRIX) 0.025 % cream Apply 1  application daily as needed topically.    [provider]  diazepam (VALIUM) 5 MG tablet Take 1 tablet (5 mg total) 2 (two) times daily by mouth. 05/13/17   Mabe, Forbes Cellar, MD  EPINEPHrine (EPIPEN 2-PAK) 0.3 mg/0.3 mL DEVI Inject 0.3 mLs (0.3 mg total) into the muscle once as needed (for severe allergic reaction). CAll 911 immediately if you have to use this medicine 11/26/12   Piepenbrink, Anderson Malta, PA-C  gabapentin (NEURONTIN) 300 MG capsule Take 300 mg by mouth at bedtime. 08/08/16   [provider]  LORazepam (ATIVAN) 1 MG tablet Take 1 mg by mouth daily as needed for anxiety.    [provider]  losartan-hydrochlorothiazide (HYZAAR) 100-12.5 MG per tablet Take 1 tablet by mouth daily.    [provider]  metoprolol tartrate (LOPRESSOR) 25 MG tablet TAKE 1/2 TAB BY MOUTH 2 TIMES DAILY. PLEASE SCHEDULE YEARLY APPT FOR MORE REFILLS, THANKS! 06/03/18   Troy Sine, MD  montelukast (SINGULAIR) 10 MG tablet Take 10 mg by mouth at bedtime.    [provider]  nitroGLYCERIN (NITROSTAT) 0.4 MG SL tablet Place 1 tablet (0.4 mg total) under the tongue every 5 (five) minutes x 3 doses as needed for chest pain. 04/01/13   Erlene Quan, PA-C  predniSONE (DELTASONE) 50 MG tablet One daily with food 07/09/18   Robyn Haber, MD  traMADol (ULTRAM) 50 MG tablet Take 50 mg 3 (three) times daily by mouth. 04/25/17   [provider]    Family History Family History  Problem Relation Age of Onset  . Hypertension Mother   . Diabetes Mother   . Cancer - Other Mother   . Cancer Sister     Social History Social History   Tobacco Use  . Smoking status: Current Every Day Smoker    Packs/day: 0.40    Years: 25.00    Pack years: 10.00    Types: Cigarettes  . Smokeless tobacco: Never Used  . Tobacco comment: Currently on the Nicoderm patch."smokes a few"  Substance Use Topics  . Alcohol use: Yes    Comment: 1 glass of wine occas. (1 every 2 weeks or  so)  . Drug use: No     Allergies   Diclofenac; Codeine; and Hydrocodone   Review of Systems Review of Systems  Constitutional: Positive for fatigue. Negative for chills, diaphoresis and fever.  Respiratory: Positive for cough and wheezing.      Physical Exam Triage Vital Signs ED Triage Vitals  Enc Vitals Group     BP 07/09/18 1713 (!) 149/67     Pulse Rate 07/09/18 1713 73     Resp 07/09/18 1713 20     Temp  07/09/18 1713 98.5 F (36.9 C)     Temp Source 07/09/18 1713 Oral     SpO2 07/09/18 1713 97 %     Weight --      Height --      Head Circumference --      Peak Flow --      Pain Score 07/09/18 1740 5     Pain Loc --      Pain Edu? --      Excl. in [place]? --    No data found.  Updated Vital Signs BP (!) 149/67 (BP Location: Left Arm)   Pulse 73   Temp 98.5 F (36.9 C) (Oral)   Resp 20   SpO2 97%    Physical Exam Vitals signs and nursing note reviewed.  Constitutional:      General: She is not in acute distress.    Appearance: Normal appearance. She is not ill-appearing or toxic-appearing.  HENT:     Head: Normocephalic.     Right Ear: Tympanic membrane normal.     Left Ear: Tympanic membrane normal.     Nose: Congestion present.     Mouth/Throat:     Mouth: Mucous membranes are moist.  Eyes:     Conjunctiva/sclera: Conjunctivae normal.     Pupils: Pupils are equal, round, and reactive to light.  Neck:     Musculoskeletal: Normal range of motion and neck supple.  Cardiovascular:     Rate and Rhythm: Normal rate and regular rhythm.     Heart sounds: Normal heart sounds.  Pulmonary:     Effort: Pulmonary effort is normal.     Breath sounds: Normal breath sounds.  Musculoskeletal: Normal range of motion.  Skin:    General: Skin is warm and dry.  Neurological:     General: No focal deficit present.     Mental Status: She is alert.  Psychiatric:        Mood and Affect: Mood normal.      UC Treatments / Results  Labs (all labs ordered are  listed, but only abnormal results are displayed) Labs Reviewed - No data to display  EKG None  Radiology No results found.  Procedures Procedures (including critical care time)  Medications Ordered in UC Medications - No data to display  Initial Impression / Assessment and Plan / UC Course  I have reviewed the triage vital signs and the nursing notes.  Pertinent labs & imaging results that were available during my care of the patient were reviewed by me and considered in my medical decision making (see chart for details).    Final Clinical Impressions(s) / UC Diagnoses   Final diagnoses:  Bronchitis   Discharge Instructions   None    ED Prescriptions    Medication Sig Dispense Auth. Provider   predniSONE (DELTASONE) 50 MG tablet One daily with food 5 tablet Robyn Haber, MD   benzonatate (TESSALON) 100 MG capsule Take 1-2 capsules (100-200 mg total) by mouth 3 (three) times daily as needed for cough. 40 capsule Robyn Haber, MD     Controlled Substance Prescriptions [place] Controlled Substance Registry consulted? Not Applicable   Robyn Haber, MD 07/09/18 1750

## 2018-07-15 ENCOUNTER — Emergency Department (HOSPITAL_COMMUNITY)
Admission: EM | Admit: 2018-07-15 | Discharge: 2018-07-15 | Disposition: A | Discharge status: Home / Self Care | Payer: Medicare Other | Attending: Emergency Medicine | Admitting: Emergency Medicine

## 2018-07-15 ENCOUNTER — Other Ambulatory Visit: Payer: Self-pay

## 2018-07-15 ENCOUNTER — Encounter (HOSPITAL_COMMUNITY): Payer: Self-pay | Admitting: Emergency Medicine

## 2018-07-15 DIAGNOSIS — I10 Essential (primary) hypertension: Secondary | ICD-10-CM | POA: Diagnosis not present

## 2018-07-15 DIAGNOSIS — I2510 Atherosclerotic heart disease of native coronary artery without angina pectoris: Secondary | ICD-10-CM | POA: Insufficient documentation

## 2018-07-15 DIAGNOSIS — J449 Chronic obstructive pulmonary disease, unspecified: Secondary | ICD-10-CM | POA: Insufficient documentation

## 2018-07-15 DIAGNOSIS — F17210 Nicotine dependence, cigarettes, uncomplicated: Secondary | ICD-10-CM | POA: Diagnosis not present

## 2018-07-15 DIAGNOSIS — Z79899 Other long term (current) drug therapy: Secondary | ICD-10-CM | POA: Insufficient documentation

## 2018-07-15 DIAGNOSIS — J45909 Unspecified asthma, uncomplicated: Secondary | ICD-10-CM | POA: Diagnosis not present

## 2018-07-15 LAB — BASIC METABOLIC PANEL
ANION GAP: 7 (ref 5–15)
BUN: 13 mg/dL (ref 8–23)
CALCIUM: 8.9 mg/dL (ref 8.9–10.3)
CO2: 30 mmol/L (ref 22–32)
Chloride: 100 mmol/L (ref 98–111)
Creatinine, Ser: 0.78 mg/dL (ref 0.44–1.00)
GFR calc non Af Amer: >60 mL/min (ref >60)
Glucose, Bld: 127 mg/dL — ABNORMAL HIGH (ref 70–99)
Potassium: 4.9 mmol/L (ref 3.5–5.1)
Sodium: 137 mmol/L (ref 135–145)

## 2018-07-15 LAB — CBC WITH DIFFERENTIAL/PLATELET
Abs Immature Granulocytes: 0.07 10*3/uL (ref 0.00–0.07)
BASOS ABS: 0.1 10*3/uL (ref 0.0–0.1)
BASOS PCT: 1 %
EOS ABS: 0.1 10*3/uL (ref 0.0–0.5)
Eosinophils Relative: 1 %
HCT: 42.5 % (ref 36.0–46.0)
Hemoglobin: 13.3 g/dL (ref 12.0–15.0)
IMMATURE GRANULOCYTES: 1 %
Lymphocytes Relative: 30 %
Lymphs Abs: 3.1 10*3/uL (ref 0.7–4.0)
MCH: 30.6 pg (ref 26.0–34.0)
MCHC: 31.3 g/dL (ref 30.0–36.0)
MCV: 97.9 fL (ref 80.0–100.0)
Monocytes Absolute: 0.7 10*3/uL (ref 0.1–1.0)
Monocytes Relative: 7 %
NEUTROS PCT: 60 %
NRBC: 0 % (ref 0.0–0.2)
Neutro Abs: 6.6 10*3/uL (ref 1.7–7.7)
Platelets: 322 10*3/uL (ref 150–400)
RBC: 4.34 MIL/uL (ref 3.87–5.11)
RDW: 13.8 % (ref 11.5–15.5)
WBC: 10.7 10*3/uL — ABNORMAL HIGH (ref 4.0–10.5)

## 2018-07-15 MED ORDER — AMLODIPINE BESYLATE 5 MG PO TABS
5.0000 mg | ORAL_TABLET | Freq: Once | ORAL | Status: AC
  Administered 2018-07-15: 5 mg via ORAL
  Filled 2018-07-15: qty 1

## 2018-07-15 MED ORDER — METOPROLOL TARTRATE 25 MG PO TABS
12.5000 mg | ORAL_TABLET | Freq: Once | ORAL | Status: AC
  Administered 2018-07-15: 12.5 mg via ORAL
  Filled 2018-07-15: qty 1

## 2018-07-15 MED ORDER — HYDROCHLOROTHIAZIDE 12.5 MG PO CAPS
12.5000 mg | ORAL_CAPSULE | Freq: Once | ORAL | Status: AC
  Administered 2018-07-15: 12.5 mg via ORAL
  Filled 2018-07-15: qty 1

## 2018-07-15 MED ORDER — LOSARTAN POTASSIUM-HCTZ 100-12.5 MG PO TABS: 1.0000 | ORAL_TABLET | Freq: Once | ORAL | Status: DC

## 2018-07-15 MED ORDER — LOSARTAN POTASSIUM 25 MG PO TABS
100.0000 mg | ORAL_TABLET | Freq: Once | ORAL | Status: AC
  Administered 2018-07-15: 100 mg via ORAL
  Filled 2018-07-15: qty 4

## 2018-07-15 NOTE — ED Notes (Signed)
BP checked manually by NT. EDP gave orders to administer all 4 bp meds per primary RN D[place],RN

## 2018-07-15 NOTE — ED Triage Notes (Signed)
Pt states she took her BP today and states it was elevated.  States the systolic was over 791.  Pt is out of her BP meds since today.  Took cough medicine twice today

## 2018-07-15 NOTE — ED Provider Notes (Signed)
[facility] EMERGENCY DEPARTMENT Provider Note   CSN: [id number] Arrival date & time: 07/15/18  1136     History   Chief Complaint Chief Complaint  Patient presents with  . Hypertension    HPI Morgan Torres is a 68 y.o. female.  Pt presents to the ED today with high bp.  Pt said she checked her bp at home and systolic was over 944.  This worried her, so she came in.  No CP.  No sob.  The pt has changed insurance plans, so had to change pharmacies.  Her last dose of bp meds were yesterday and she is out of pills.  She said the pharmacy was supposed to have the medication today, but did not have them meds ready.  Pt has also had a cough and took some old Tussionex and Tessalon perles this morning.     Past Medical History:  Diagnosis Date  . Anxiety   . Arthritis    hip, lumbar spine   . Asthma    seasonal allergies   . Bronchitis    Hx: of  . COPD (chronic obstructive pulmonary disease) ([place])   . Coronary artery disease   . Depression   . Fibromyalgia   . Hypertension   . Lumbar herniated disc   . Myocardial infarction [facility]) 2014   followed by Dr. Debara Pickett, treated medically, no stents  . Pneumonia    Hx: of  . Sciatica   . Small bowel obstruction [facility])     Patient Active Problem List   Diagnosis Date Noted  . Morbid obesity, unspecified obesity type ([place]) 08/28/2016  . Non-restorative sleep 08/28/2016  . Snoring 08/28/2016  . Hip pain 06/03/2014  . Acute blood loss anemia 06/01/2014  . Osteoarthritis of left hip 05/28/2014  . Obesity (BMI 30.0-34.9) 04/20/2013  . Nonischemic cardiomyopathy ([place]) 04/01/2013  . Smoking 04/01/2013  . NSTEMI - Troponin pk 1.5 with ant TWI on EKG 03/29/2013  . Spinal stenosis, lumbar region, with neurogenic claudication 01/23/2013  . VAGINAL TRICHOMONIASIS 06/28/2006  . Hyperlipidemia 06/28/2006  . ANEMIA-NOS 06/28/2006  . ANXIETY 06/28/2006  . DEPRESSION 06/28/2006  . SYNDROME, CARPAL TUNNEL 06/28/2006  . Essential  hypertension 06/28/2006  . ALLERGIC RHINITIS 06/28/2006  . ASTHMA 06/28/2006  . PAROTIDITIS 06/28/2006  . LOW BACK PAIN 06/28/2006  . TB SKIN TEST, POSITIVE, HX OF 06/28/2006    Past Surgical History:  Procedure Laterality Date  . ABDOMINAL HYSTERECTOMY    . ABDOMINAL SURGERY     resection of small intestine  . BACK SURGERY     fusion  . CARDIAC CATHETERIZATION  2014  . COLON SURGERY     resection for bowel - for obstruction   . COLONOSCOPY     Hx; of  . DILATION AND CURETTAGE OF UTERUS    . HERNIA REPAIR  1970's   inguinal hernia   . LEFT HEART CATHETERIZATION WITH CORONARY ANGIOGRAM N/A 03/30/2013   Procedure: LEFT HEART CATHETERIZATION WITH CORONARY ANGIOGRAM;  Surgeon: Leonie Man, MD;  Location: [facility] CATH LAB;  Service: Cardiovascular;  Laterality: N/A;  . SALIVARY GLAND SURGERY Left 1970-1980   approached from inside mouth & side of neck  . TONSILLECTOMY    . TOTAL HIP ARTHROPLASTY Left 05/28/2014   Procedure: LEFT TOTAL HIP ARTHROPLASTY;  Surgeon: Yvette Rack., MD;  Location: [place];  Service: Orthopedics;  Laterality: Left;  . TOTAL HIP ARTHROPLASTY Left 05/28/2014   dr caffrey     OB History  No obstetric history on file.      Home Medications    Prior to Admission medications   Medication Sig Start Date End Date Taking? Authorizing Provider  acetaminophen (TYLENOL) 500 MG tablet Take 1,000 mg every 6 (six) hours as needed by mouth for mild pain.    [provider]  albuterol (PROVENTIL HFA;VENTOLIN HFA) 108 (90 Base) MCG/ACT inhaler Inhale 2 puffs into the lungs every 6 (six) hours as needed for wheezing or shortness of breath. 05/10/16   Katy Apo, NP  amLODipine (NORVASC) 5 MG tablet Take 1 tablet (5 mg total) by mouth daily. 12/23/17   Troy Sine, MD  amLODipine (NORVASC) 5 MG tablet Take 1 tablet (5 mg total) by mouth daily. 01/07/18   Troy Sine, MD  aspirin 81 MG tablet Take 81 mg by mouth daily.    [provider]    benzonatate (TESSALON) 100 MG capsule Take 1-2 capsules (100-200 mg total) by mouth 3 (three) times daily as needed for cough. 07/09/18   Robyn Haber, MD  capsaicin (ZOSTRIX) 0.025 % cream Apply 1 application daily as needed topically.    [provider]  diazepam (VALIUM) 5 MG tablet Take 1 tablet (5 mg total) 2 (two) times daily by mouth. 05/13/17   Mabe, Forbes Cellar, MD  EPINEPHrine (EPIPEN 2-PAK) 0.3 mg/0.3 mL DEVI Inject 0.3 mLs (0.3 mg total) into the muscle once as needed (for severe allergic reaction). CAll 911 immediately if you have to use this medicine 11/26/12   Piepenbrink, Anderson Malta, PA-C  gabapentin (NEURONTIN) 300 MG capsule Take 300 mg by mouth at bedtime. 08/08/16   [provider]  LORazepam (ATIVAN) 1 MG tablet Take 1 mg by mouth daily as needed for anxiety.    [provider]  losartan-hydrochlorothiazide (HYZAAR) 100-12.5 MG per tablet Take 1 tablet by mouth daily.    [provider]  metoprolol tartrate (LOPRESSOR) 25 MG tablet TAKE 1/2 TAB BY MOUTH 2 TIMES DAILY. PLEASE SCHEDULE YEARLY APPT FOR MORE REFILLS, THANKS! 06/03/18   Troy Sine, MD  montelukast (SINGULAIR) 10 MG tablet Take 10 mg by mouth at bedtime.    [provider]  nitroGLYCERIN (NITROSTAT) 0.4 MG SL tablet Place 1 tablet (0.4 mg total) under the tongue every 5 (five) minutes x 3 doses as needed for chest pain. 04/01/13   Erlene Quan, PA-C  predniSONE (DELTASONE) 50 MG tablet One daily with food 07/09/18   Robyn Haber, MD  traMADol (ULTRAM) 50 MG tablet Take 50 mg 3 (three) times daily by mouth. 04/25/17   [provider]    Family History Family History  Problem Relation Age of Onset  . Hypertension Mother   . Diabetes Mother   . Cancer - Other Mother   . Cancer Sister     Social History Social History   Tobacco Use  . Smoking status: Current Every Day Smoker    Packs/day: 0.40    Years: 25.00    Pack years: 10.00    Types: Cigarettes   . Smokeless tobacco: Never Used  . Tobacco comment: Currently on the Nicoderm patch."smokes a few"  Substance Use Topics  . Alcohol use: Yes    Comment: 1 glass of wine occas. (1 every 2 weeks or so)  . Drug use: No     Allergies   Diclofenac; Codeine; and Hydrocodone   Review of Systems Review of Systems  All other systems reviewed and are negative.    Physical  Exam Updated Vital Signs BP (!) 145/56   Pulse (!) 47   Temp 98.2 F (36.8 C) (Oral)   Resp 12   Ht 5\' 6"  (1.676 m)   Wt 102.1 kg   SpO2 99%   BMI 36.32 kg/m   Physical Exam Vitals signs and nursing note reviewed.  Constitutional:      Appearance: Normal appearance.  HENT:     Head: Normocephalic and atraumatic.     Right Ear: External ear normal.     Left Ear: External ear normal.     Nose: Nose normal.     Mouth/Throat:     Mouth: Mucous membranes are moist.     Pharynx: Oropharynx is clear.  Eyes:     Extraocular Movements: Extraocular movements intact.     Conjunctiva/sclera: Conjunctivae normal.     Pupils: Pupils are equal, round, and reactive to light.  Neck:     Musculoskeletal: Normal range of motion and neck supple.  Cardiovascular:     Rate and Rhythm: Normal rate and regular rhythm.     Pulses: Normal pulses.     Heart sounds: Normal heart sounds.  Pulmonary:     Effort: Pulmonary effort is normal.     Breath sounds: Normal breath sounds.  Abdominal:     General: Abdomen is flat. Bowel sounds are normal.     Palpations: Abdomen is soft.  Musculoskeletal: Normal range of motion.  Skin:    General: Skin is warm and dry.     Capillary Refill: Capillary refill takes less than 2 seconds.  Neurological:     General: No focal deficit present.     Mental Status: She is alert and oriented to person, place, and time.  Psychiatric:        Mood and Affect: Mood normal.        Behavior: Behavior normal.      ED Treatments / Results  Labs (all labs ordered are listed, but only  abnormal results are displayed) Labs Reviewed  BASIC METABOLIC PANEL - Abnormal; Notable for the following components:      Result Value   Glucose, Bld 127 (*)    All other components within normal limits  CBC WITH DIFFERENTIAL/PLATELET - Abnormal; Notable for the following components:   WBC 10.7 (*)    All other components within normal limits    EKG EKG Interpretation  Date/Time:  Tuesday July 15 2018 13:21:23 EST Ventricular Rate:  50 PR Interval:    QRS Duration: 72 QT Interval:  451 QTC Calculation: 412 R Axis:   67 Text Interpretation:  Sinus rhythm Low voltage, precordial leads No significant change since last tracing Confirmed by Isla Pence [id number]) on 07/15/2018 1:23:13 PM   Radiology No results found.  Procedures Procedures (including critical care time)  Medications Ordered in ED Medications  amLODipine (NORVASC) tablet 5 mg (5 mg Oral Given 07/15/18 1438)  metoprolol tartrate (LOPRESSOR) tablet 12.5 mg (12.5 mg Oral Given 07/15/18 1437)  losartan (COZAAR) tablet 100 mg (100 mg Oral Given 07/15/18 1437)    And  hydrochlorothiazide (MICROZIDE) capsule 12.5 mg (12.5 mg Oral Given 07/15/18 1437)     Initial Impression / Assessment and Plan / ED Course  I have reviewed the triage vital signs and the nursing notes.  Pertinent labs & imaging results that were available during my care of the patient were reviewed by me and considered in my medical decision making (see chart for details).    Pt given all of  her home meds.  SBP now 150.  She is told to avoid cough syrup and to take her meds as directed.  She will go from here to the pharmacy to pick up her meds.  Return if worse.  F/u with pcp.  Final Clinical Impressions(s) / ED Diagnoses   Final diagnoses:  Essential hypertension    ED Discharge Orders    None       Isla Pence, MD 07/15/18 [phone]

## 2018-08-01 DIAGNOSIS — G4733 Obstructive sleep apnea (adult) (pediatric): Secondary | ICD-10-CM | POA: Diagnosis not present

## 2018-08-14 ENCOUNTER — Other Ambulatory Visit: Payer: Self-pay | Admitting: Cardiovascular Disease

## 2018-08-18 DIAGNOSIS — J449 Chronic obstructive pulmonary disease, unspecified: Secondary | ICD-10-CM | POA: Diagnosis not present

## 2018-08-18 DIAGNOSIS — R310 Gross hematuria: Secondary | ICD-10-CM | POA: Diagnosis not present

## 2018-08-18 DIAGNOSIS — N390 Urinary tract infection, site not specified: Secondary | ICD-10-CM | POA: Diagnosis not present

## 2018-08-20 DIAGNOSIS — M25561 Pain in right knee: Secondary | ICD-10-CM | POA: Diagnosis not present

## 2018-08-26 DIAGNOSIS — Z0000 Encounter for general adult medical examination without abnormal findings: Secondary | ICD-10-CM | POA: Diagnosis not present

## 2018-08-26 DIAGNOSIS — R310 Gross hematuria: Secondary | ICD-10-CM | POA: Diagnosis not present

## 2018-09-05 ENCOUNTER — Ambulatory Visit: Payer: Medicare HMO | Admitting: Cardiovascular Disease

## 2018-09-08 ENCOUNTER — Encounter: Payer: Self-pay | Admitting: *Deleted

## 2018-09-17 DIAGNOSIS — R3129 Other microscopic hematuria: Secondary | ICD-10-CM | POA: Diagnosis not present

## 2018-09-17 DIAGNOSIS — Z87448 Personal history of other diseases of urinary system: Secondary | ICD-10-CM | POA: Diagnosis not present

## 2018-09-18 DIAGNOSIS — R3129 Other microscopic hematuria: Secondary | ICD-10-CM | POA: Diagnosis not present

## 2018-09-18 DIAGNOSIS — Z87448 Personal history of other diseases of urinary system: Secondary | ICD-10-CM | POA: Diagnosis not present

## 2018-09-24 ENCOUNTER — Other Ambulatory Visit: Payer: Self-pay

## 2018-09-24 ENCOUNTER — Telehealth: Payer: Self-pay

## 2018-09-24 NOTE — Telephone Encounter (Signed)
Virtual Visit Pre-Appointment Phone Call  Morgan Torres has been deemed a candidate for a follow-up tele-health visit to limit community exposure during the Covid-19 pandemic. I spoke with the patient via phone to ensure availability of phone/video source, confirm preferred email & phone number, and discuss instructions and expectations.  I reminded Morgan Torres to be prepared with any vital sign and/or heart rhythm information that could potentially be obtained via home monitoring, at the time of her visit. I reminded Morgan Torres to expect a phone call at the time of her visit if her visit.  Did the patient verbally acknowledge consent to treatment? Patient provided verbal consent.   Morgan Torres, [place], [place] 09/24/2018 1:51 PM   DOWNLOADING THE [place]  - If Apple, go to [employer] and type in WebEx in the search bar. [employer], the blue/green circle. The app is free but as with any other app downloads, their phone may require them to verify saved payment information or Apple password. The patient does NOT have to create an account.  - If Android, ask patient to go to [employer] and type in WebEx in the search bar. [place] [employer], the blue/green circle. The app is free but as with any other app downloads, their phone may require them to verify saved payment information or Android password. The patient does NOT have to create an account.   CONSENT FOR TELE-HEALTH VISIT - PLEASE REVIEW  I hereby voluntarily request, consent and authorize [facility] and its employed or contracted physicians, physician assistants, nurse practitioners or other licensed health care professionals (the Practitioner), to provide me with telemedicine health care services (the "Services") as deemed necessary by the treating Practitioner. I acknowledge and consent to receive the Services by the Practitioner via telemedicine. I understand that the  telemedicine visit will involve communicating with the Practitioner through live audiovisual communication technology and the disclosure of certain medical information by electronic transmission. I acknowledge that I have been given the opportunity to request an in-person assessment or other available alternative prior to the telemedicine visit and am voluntarily participating in the telemedicine visit.  I understand that I have the right to withhold or withdraw my consent to the use of telemedicine in the course of my care at any time, without affecting my right to future care or treatment, and that the Practitioner or I may terminate the telemedicine visit at any time. I understand that I have the right to inspect all information obtained and/or recorded in the course of the telemedicine visit and may receive copies of available information for a reasonable fee.  I understand that some of the potential risks of receiving the Services via telemedicine include:  Marland Kitchen Delay or interruption in medical evaluation due to technological equipment failure or disruption; . Information transmitted may not be sufficient (e.g. poor resolution of images) to allow for appropriate medical decision making by the Practitioner; and/or  . In rare instances, security protocols could fail, causing a breach of personal health information.  Furthermore, I acknowledge that it is my responsibility to provide information about my medical history, conditions and care that is complete and accurate to the best of my ability. I acknowledge that Practitioner's advice, recommendations, and/or decision may be based on factors not within their control, such as incomplete or inaccurate data provided by me or distortions of diagnostic images or specimens that may result from electronic transmissions. I understand that the  practice of medicine is not an Chief Strategy Officer and that Practitioner makes no warranties or guarantees regarding treatment  outcomes. I acknowledge that I will receive a copy of this consent concurrently upon execution via email to the email address I last provided but may also request a printed copy by calling the office of [facility].    I understand that my insurance will be billed for this visit.   I have read or had this consent read to me. . I understand the contents of this consent, which adequately explains the benefits and risks of the Services being provided via telemedicine.  . I have been provided ample opportunity to ask questions regarding this consent and the Services and have had my questions answered to my satisfaction. . I give my informed consent for the services to be provided through the use of telemedicine in my medical care  By participating in this telemedicine visit I agree to the above.

## 2018-09-25 ENCOUNTER — Telehealth (INDEPENDENT_AMBULATORY_CARE_PROVIDER_SITE_OTHER): Payer: Medicare Other | Admitting: Internal Medicine

## 2018-09-25 ENCOUNTER — Encounter: Payer: Self-pay | Admitting: Internal Medicine

## 2018-09-25 ENCOUNTER — Other Ambulatory Visit: Payer: Self-pay | Admitting: *Deleted

## 2018-09-25 VITALS — BP 130/62 | HR 60 | Ht 68.0 in | Wt 230.0 lb

## 2018-09-25 DIAGNOSIS — E7800 Pure hypercholesterolemia, unspecified: Secondary | ICD-10-CM | POA: Diagnosis not present

## 2018-09-25 DIAGNOSIS — E782 Mixed hyperlipidemia: Secondary | ICD-10-CM | POA: Diagnosis not present

## 2018-09-25 DIAGNOSIS — I10 Essential (primary) hypertension: Secondary | ICD-10-CM

## 2018-09-25 DIAGNOSIS — G4733 Obstructive sleep apnea (adult) (pediatric): Secondary | ICD-10-CM

## 2018-09-25 DIAGNOSIS — E668 Other obesity: Secondary | ICD-10-CM | POA: Diagnosis not present

## 2018-09-25 DIAGNOSIS — F17200 Nicotine dependence, unspecified, uncomplicated: Secondary | ICD-10-CM

## 2018-09-25 MED ORDER — NITROGLYCERIN 0.4 MG SL SUBL: 0.4000 mg | SUBLINGUAL_TABLET | SUBLINGUAL | 2 refills | Status: AC | PRN

## 2018-09-25 NOTE — Progress Notes (Signed)
Virtual Visit via Telephone Note    Evaluation Performed:  Routine follow-up  This visit type was conducted due to national recommendations for restrictions regarding the COVID-19 Pandemic (e.g. social distancing).  This format is felt to be most appropriate for this patient at this time.  All issues noted in this document were discussed and addressed.  No physical exam was performed (except for noted visual exam findings with Video Visits).  Please refer to the patient's chart (MyChart message for video visits and phone note for telephone visits) for the patient's consent to telehealth for [facility].  Date:  09/25/2018   ID:  Morgan Torres, DOB 1950-10-23, MRN [id number]  Patient Location:  [place] [address]  Provider location:   [address], [place], [place] [address]  PCP:  [name], MD  Cardiologist:  No primary care provider on file. Electrophysiologist:  None   Chief Complaint:  No complaints  History of Present Illness:    Morgan Torres is a 68 y.o. female who presents via audio/video conferencing for a telehealth visit today.  This is a 68 year old female who I followed for a number of years since she had non-ST elevation MI in 2014.  She underwent heart catheterization which showed mild to moderate nonobstructive coronary disease which is out of proportion for her degree of cardiomyopathy.  That demonstrated wall motion abnormalities suggestive of Takatsubo cardiomyopathy based on LV gram however echo subsequently showed an EF of 55% with no wall motion abnormalities.  She also has underlying hypertension and obstructive sleep apnea.  She has been followed by Dr. Claiborne Billings.  Recently she had trouble with her CPAP equipment but has a new provider and for the past several months has been getting her equipment.  She uses it fairly regularly except recently she has been having issues with sinus infections.  She ordered a so clean machine to  try and mitigate the risk of possible infection with CPAP.  Overall she denies any chest pain or worsening shortness of breath.  I have not seen any labs however in evaluation of her dyslipidemia.  This is apparently followed by her PCP however report through the [facility] reporting tool did not show any recent lipid profile.  The patient does not have symptoms concerning for COVID-19 infection (fever, chills, cough, or new SHORTNESS OF BREATH).    Prior CV studies:   The following studies were reviewed today:  [facility] multisite lab tool  PMHx:  Past Medical History:  Diagnosis Date   Anxiety    Arthritis    hip, lumbar spine    Asthma    seasonal allergies    Bronchitis    Hx: of   COPD (chronic obstructive pulmonary disease) ([place])    Coronary artery disease    Depression    Fibromyalgia    Hypertension    Lumbar herniated disc    Myocardial infarction ([place]) 2014   followed by Dr. Debara Pickett, treated medically, no stents   Pneumonia    Hx: of   Sciatica    Small bowel obstruction ([place])     Past Surgical History:  Procedure Laterality Date   ABDOMINAL HYSTERECTOMY     ABDOMINAL SURGERY     resection of small intestine   BACK SURGERY     fusion   CARDIAC CATHETERIZATION  2014   COLON SURGERY     resection for bowel - for obstruction    COLONOSCOPY  Hx; of   DILATION AND CURETTAGE OF UTERUS     HERNIA REPAIR  1970's   inguinal hernia    LEFT HEART CATHETERIZATION WITH CORONARY ANGIOGRAM N/A 03/30/2013   Procedure: LEFT HEART CATHETERIZATION WITH CORONARY ANGIOGRAM;  Surgeon: Leonie Man, MD;  Location: [facility] CATH LAB;  Service: Cardiovascular;  Laterality: N/A;   SALIVARY GLAND SURGERY Left 1970-1980   approached from inside mouth & side of neck   TONSILLECTOMY     TOTAL HIP ARTHROPLASTY Left 05/28/2014   Procedure: LEFT TOTAL HIP ARTHROPLASTY;  Surgeon: Yvette Rack., MD;  Location: [place];  Service: Orthopedics;  Laterality: Left;   TOTAL HIP  ARTHROPLASTY Left 05/28/2014   dr caffrey    FAMHx:  Family History  Problem Relation Age of Onset   Hypertension Mother    Diabetes Mother    Cancer - Other Mother    Cancer Sister     SOCHx:   reports that she has been smoking cigarettes. She has a 10.00 pack-year smoking history. She has never used smokeless tobacco. She reports current alcohol use. She reports that she does not use drugs.  ALLERGIES:  Allergies  Allergen Reactions   Diclofenac Anaphylaxis   Iodinated Diagnostic Agents Other (See Comments)   Codeine Itching   Hydrocodone Itching    MEDS:  Current Meds  Medication Sig   acetaminophen (TYLENOL) 500 MG tablet Take 1,000 mg every 6 (six) hours as needed by mouth for mild pain.   albuterol (PROVENTIL HFA;VENTOLIN HFA) 108 (90 Base) MCG/ACT inhaler Inhale 2 puffs into the lungs every 6 (six) hours as needed for wheezing or shortness of breath.   amLODipine (NORVASC) 5 MG tablet Take 1 tablet (5 mg total) by mouth daily.   aspirin 81 MG tablet Take 81 mg by mouth daily.   EPINEPHrine (EPIPEN 2-PAK) 0.3 mg/0.3 mL DEVI Inject 0.3 mLs (0.3 mg total) into the muscle once as needed (for severe allergic reaction). CAll 911 immediately if you have to use this medicine   gabapentin (NEURONTIN) 300 MG capsule Take 300 mg by mouth at bedtime.   hydrochlorothiazide (HYDRODIURIL) 25 MG tablet Take 1 tablet by mouth daily.   lidocaine (LIDODERM) 5 % Place 1 patch onto the skin every 12 (twelve) hours.   LORazepam (ATIVAN) 1 MG tablet Take 1 mg by mouth daily as needed for anxiety.   losartan (COZAAR) 100 MG tablet Take 1 tablet by mouth daily.   metoprolol tartrate (LOPRESSOR) 25 MG tablet TAKE 1/2 TAB BY MOUTH 2 TIMES DAILY. PLEASE SCHEDULE YEARLY APPT FOR MORE REFILLS, THANKS!   montelukast (SINGULAIR) 10 MG tablet Take 10 mg by mouth at bedtime.   nitroGLYCERIN (NITROSTAT) 0.4 MG SL tablet Place 1 tablet (0.4 mg total) under the tongue every 5 (five)  minutes x 3 doses as needed for chest pain.   predniSONE (DELTASONE) 5 MG tablet Take 5 mg by mouth daily.   [DISCONTINUED] diazepam (VALIUM) 5 MG tablet Take 1 tablet (5 mg total) 2 (two) times daily by mouth.     ROS: Pertinent items noted in HPI and remainder of comprehensive ROS otherwise negative.  Labs/Other Tests and Data Reviewed:    Recent Labs: 07/15/2018: BUN 13; Creatinine, Ser 0.78; Hemoglobin 13.3; Platelets 322; Potassium 4.9; Sodium 137   Recent Lipid Panel Lab Results  Component Value Date/Time   CHOL 231 (H) 03/30/2013 09:29 AM   TRIG 105 03/30/2013 09:29 AM   HDL 51 03/30/2013 09:29 AM   CHOLHDL 4.5  03/30/2013 09:29 AM   LDLCALC 159 (H) 03/30/2013 09:29 AM    Wt Readings from Last 3 Encounters:  09/25/18 230 lb (104.3 kg)  07/15/18 225 lb (102.1 kg)  04/18/18 217 lb (98.4 kg)     Exam:    Vital Signs:  BP 130/62    Pulse 60    Ht 5\' 8"  (1.727 m)    Wt 230 lb (104.3 kg)    BMI 34.97 kg/m    telephone encounter, no exam  ASSESSMENT & PLAN:    1. History of NSTEMI without any clear culprit lesion on cath 2. Possible Takatsubo cardiomyopathy 3. Dyslipidemia - followed by pcp 4. Hypertension - at goal 5. Tobacco abuse - working on quitting 6. OSA on CPAP  Mrs. Hardrick has had no recurrent symptoms of chest pain or concerning features regarding her non-ST elevation MI in 2014.  LVEF had recovered.  There was a question of possible Takatsubo cardiomyopathy however no clear inciting event was noted.  She does have dyslipidemia in the past with LDL of 159 and given the fact that she had mild to moderate nonobstructive disease by cath in 2014, aggressive lipid management with LDL goal less than 70 is recommended.  I will plan to repeat a lipid profile as she is not currently on treatment and she may need to be on a statin. Blood pressure is well-controlled. She has recently been more compliant with CPAP after securing a new equipment provider. Finally, we  discussed smoking cessation - she is currently using a nicotine patch and is interested in quitting.  Follow-up annually or sooner as necessary.  COVID-19 Education: The signs and symptoms of COVID-19 were discussed with the patient and how to seek care for testing (follow up with PCP or arrange E-visit).  The importance of social distancing was discussed today.  Patient Risk:   After full review of this patients clinical status, I feel that they are at least moderate risk at this time.  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     Medication Adjustments/Labs and Tests Ordered: Current medicines are reviewed at length with the patient today.  Concerns regarding medicines are outlined above.   Tests Ordered: Orders Placed This Encounter  Procedures   Lipid panel    Medication Changes: No orders of the defined types were placed in this encounter.   Disposition:  in 1 year(s)  Pixie Casino, MD, [facility], [place] Director of the Advanced Lipid Disorders &  Cardiovascular Risk Reduction Clinic Diplomate of the American Board of Clinical Lipidology Attending Cardiologist  Direct Dial: [phone]   Fax: [phone]  Website:  [email or web address]  Pixie Casino, MD  09/25/2018 9:02 AM

## 2018-09-25 NOTE — Patient Instructions (Signed)
Medication Instructions:  NO CHANGE If you need a refill on your cardiac medications before your next appointment, please call your pharmacy.   Lab work: Your physician recommends that you return for lab work PRIOR TO EATING If you have labs (blood work) drawn today and your tests are completely normal, you will receive your results only by: Marland Kitchen MyChart Message (if you have MyChart) OR . A paper copy in the mail If you have any lab test that is abnormal or we need to change your treatment, we will call you to review the results.  Follow-Up: At [facility], you and your health needs are our priority.  As part of our continuing mission to provide you with exceptional heart care, we have created designated Provider Care Teams.  These Care Teams include your primary Cardiologist (physician) and Advanced Practice Providers (APPs -  Physician Assistants and Nurse Practitioners) who all work together to provide you with the care you need, when you need it. You will need a follow up appointment in 12 months.  Please call our office 2 months in advance to schedule this appointment.  You may see DR HILTY or one of the following Advanced Practice Providers on your designated Care Team: Almyra Deforest, [place] . Fabian Sharp, PA-C

## 2018-10-17 DIAGNOSIS — R310 Gross hematuria: Secondary | ICD-10-CM | POA: Diagnosis not present

## 2018-11-19 DIAGNOSIS — G4733 Obstructive sleep apnea (adult) (pediatric): Secondary | ICD-10-CM | POA: Diagnosis not present

## 2018-11-23 ENCOUNTER — Other Ambulatory Visit: Payer: Self-pay | Admitting: Cardiovascular Disease

## 2018-12-30 DIAGNOSIS — J449 Chronic obstructive pulmonary disease, unspecified: Secondary | ICD-10-CM | POA: Diagnosis not present

## 2018-12-30 DIAGNOSIS — M545 Low back pain: Secondary | ICD-10-CM | POA: Diagnosis not present

## 2018-12-30 DIAGNOSIS — Z720 Tobacco use: Secondary | ICD-10-CM | POA: Diagnosis not present

## 2019-01-16 ENCOUNTER — Other Ambulatory Visit: Payer: Self-pay | Admitting: Cardiovascular Disease

## 2019-01-25 ENCOUNTER — Ambulatory Visit (HOSPITAL_COMMUNITY)
Admission: EM | Admit: 2019-01-25 | Discharge: 2019-01-25 | Disposition: A | Discharge status: Home / Self Care | Payer: Medicare Other | Attending: Urgent Care | Admitting: Urgent Care

## 2019-01-25 ENCOUNTER — Other Ambulatory Visit: Payer: Self-pay

## 2019-01-25 ENCOUNTER — Encounter (HOSPITAL_COMMUNITY): Payer: Self-pay | Admitting: *Deleted

## 2019-01-25 DIAGNOSIS — I10 Essential (primary) hypertension: Secondary | ICD-10-CM | POA: Diagnosis not present

## 2019-01-25 DIAGNOSIS — R350 Frequency of micturition: Secondary | ICD-10-CM

## 2019-01-25 DIAGNOSIS — N3001 Acute cystitis with hematuria: Secondary | ICD-10-CM | POA: Diagnosis not present

## 2019-01-25 LAB — POCT URINALYSIS DIP (DEVICE)
Bilirubin Urine: NEGATIVE
Glucose, UA: NEGATIVE mg/dL
Ketones, ur: NEGATIVE mg/dL
Nitrite: NEGATIVE
Protein, ur: 100 mg/dL — AB
Specific Gravity, Urine: 1.015 (ref 1.005–1.030)
Urobilinogen, UA: 0.2 mg/dL (ref 0.0–1.0)
pH: 6.5 (ref 5.0–8.0)

## 2019-01-25 MED ORDER — CEPHALEXIN 500 MG PO CAPS: 500.0000 mg | ORAL_CAPSULE | Freq: Two times a day (BID) | ORAL | 0 refills | Status: DC

## 2019-01-25 NOTE — ED Provider Notes (Signed)
MRN: [id number] DOB: 11-26-1950  Subjective:   Morgan Torres is a 68 y.o. female presenting for 2-day history of mild to moderate pelvic pressure from urinating, urinary frequency, hematuria.  Patient is working closely with a urologist, has had work-up with normal findings.  She has not had any kidney stones.  She denies frank dysuria, abdominal or pelvic pain, flank pain, fever, nausea, vomiting.  Of note, patient hydrates with about 2-4 bottles of water per day.  She does admit to drinking Pepsi in large quantities every day, also drinks coffee and green tea.   No current facility-administered medications for this encounter.   Current Outpatient Medications:  .  amLODipine (NORVASC) 5 MG tablet, TAKE 1 TABLET BY MOUTH EVERY DAY, Disp: 90 tablet, Rfl: 2 .  aspirin 81 MG tablet, Take 81 mg by mouth daily., Disp: , Rfl:  .  gabapentin (NEURONTIN) 300 MG capsule, Take 300 mg by mouth at bedtime., Disp: , Rfl: 2 .  LOSARTAN POTASSIUM-HCTZ PO, Take by mouth., Disp: , Rfl:  .  metoprolol tartrate (LOPRESSOR) 25 MG tablet, TAKE 1/2 TAB BY MOUTH 2 TIMES DAILY. PLEASE SCHEDULE YEARLY APPT FOR MORE REFILLS, THANKS!, Disp: 90 tablet, Rfl: 0 .  montelukast (SINGULAIR) 10 MG tablet, Take 10 mg by mouth at bedtime., Disp: , Rfl:  .  acetaminophen (TYLENOL) 500 MG tablet, Take 1,000 mg every 6 (six) hours as needed by mouth for mild pain., Disp: , Rfl:  .  albuterol (PROVENTIL HFA;VENTOLIN HFA) 108 (90 Base) MCG/ACT inhaler, Inhale 2 puffs into the lungs every 6 (six) hours as needed for wheezing or shortness of breath., Disp: 1 Inhaler, Rfl: 0 .  EPINEPHrine (EPIPEN 2-PAK) 0.3 mg/0.3 mL DEVI, Inject 0.3 mLs (0.3 mg total) into the muscle once as needed (for severe allergic reaction). CAll 911 immediately if you have to use this medicine, Disp: 1 Device, Rfl: 1 .  LORazepam (ATIVAN) 1 MG tablet, Take 1 mg by mouth daily as needed for anxiety., Disp: , Rfl:  .  nitroGLYCERIN (NITROSTAT) 0.4 MG SL tablet,  Place 1 tablet (0.4 mg total) under the tongue every 5 (five) minutes x 3 doses as needed for chest pain., Disp: 25 tablet, Rfl: 2    Allergies  Allergen Reactions  . Diclofenac Anaphylaxis  . Iodinated Diagnostic Agents Other (See Comments)  . Codeine Itching  . Hydrocodone Itching    Past Medical History:  Diagnosis Date  . Anxiety   . Arthritis    hip, lumbar spine   . Asthma    seasonal allergies   . Bronchitis    Hx: of  . COPD (chronic obstructive pulmonary disease) ([place])   . Coronary artery disease   . Depression   . Fibromyalgia   . Hypertension   . Lumbar herniated disc   . Myocardial infarction [facility]) 2014   followed by Dr. Debara Pickett, treated medically, no stents  . Pneumonia    Hx: of  . Sciatica   . Small bowel obstruction [facility])      Past Surgical History:  Procedure Laterality Date  . ABDOMINAL HYSTERECTOMY    . ABDOMINAL SURGERY     resection of small intestine  . BACK SURGERY     fusion  . CARDIAC CATHETERIZATION  2014  . COLON SURGERY     resection for bowel - for obstruction   . COLONOSCOPY     Hx; of  . DILATION AND CURETTAGE OF UTERUS    . HERNIA REPAIR  1970's  inguinal hernia   . LEFT HEART CATHETERIZATION WITH CORONARY ANGIOGRAM N/A 03/30/2013   Procedure: LEFT HEART CATHETERIZATION WITH CORONARY ANGIOGRAM;  Surgeon: Leonie Man, MD;  Location: [facility] CATH LAB;  Service: Cardiovascular;  Laterality: N/A;  . SALIVARY GLAND SURGERY Left 1970-1980   approached from inside mouth & side of neck  . TONSILLECTOMY    . TOTAL HIP ARTHROPLASTY Left 05/28/2014   Procedure: LEFT TOTAL HIP ARTHROPLASTY;  Surgeon: Yvette Rack., MD;  Location: [facility];  Service: Orthopedics;  Laterality: Left;  . TOTAL HIP ARTHROPLASTY Left 05/28/2014   dr caffrey    ROS  Objective:   Vitals: BP 126/67   Pulse 66   Temp 98.4 F (36.9 C) (Oral)   Resp 16   SpO2 97%    Physical Exam Constitutional:      General: She is not in acute distress.    Appearance:  Normal appearance. She is well-developed and normal weight. She is not ill-appearing, toxic-appearing or diaphoretic.  HENT:     Head: Normocephalic and atraumatic.     Right Ear: External ear normal.     Left Ear: External ear normal.     Nose: Nose normal.     Mouth/Throat:     Mouth: Mucous membranes are moist.     Pharynx: Oropharynx is clear.  Eyes:     General: No scleral icterus.    Extraocular Movements: Extraocular movements intact.     Pupils: Pupils are equal, round, and reactive to light.  Cardiovascular:     Rate and Rhythm: Normal rate and regular rhythm.     Heart sounds: Normal heart sounds. No murmur. No friction rub. No gallop.   Pulmonary:     Effort: Pulmonary effort is normal. No respiratory distress.     Breath sounds: Normal breath sounds. No stridor. No wheezing, rhonchi or rales.  Abdominal:     General: Bowel sounds are normal. There is no distension.     Palpations: Abdomen is soft. There is no mass.     Tenderness: There is no abdominal tenderness. There is no right CVA tenderness, left CVA tenderness, guarding or rebound.  Skin:    General: Skin is warm and dry.     Coloration: Skin is not pale.     Findings: No rash.  Neurological:     General: No focal deficit present.     Mental Status: She is alert and oriented to person, place, and time.  Psychiatric:        Mood and Affect: Mood normal.        Behavior: Behavior normal.        Thought Content: Thought content normal.        Judgment: Judgment normal.      Results for orders placed or performed during the hospital encounter of 01/25/19 (from the past 24 hour(s))  POCT urinalysis dip (device)     Status: Abnormal   Collection Time: 01/25/19  4:36 PM  Result Value Ref Range   Glucose, UA NEGATIVE NEGATIVE mg/dL   Bilirubin Urine NEGATIVE NEGATIVE   Ketones, ur NEGATIVE NEGATIVE mg/dL   Specific Gravity, Urine 1.015 1.005 - 1.030   Hgb urine dipstick LARGE (A) NEGATIVE   pH 6.5 5.0 - 8.0    Protein, ur 100 (A) NEGATIVE mg/dL   Urobilinogen, UA 0.2 0.0 - 1.0 mg/dL   Nitrite NEGATIVE NEGATIVE   Leukocytes,Ua TRACE (A) NEGATIVE    Assessment and Plan :   1. Acute  cystitis with hematuria   2. Urinary frequency   3. Essential hypertension     Counseled patient against drinking as much soda, tea as she is.  Will cover for cystitis given leuko-urea, urinary frequency.  Urine culture pending.  Physical exam findings reassuring.  Patient is to maintain appointment with urologist. Counseled patient on potential for adverse effects with medications prescribed/recommended today, ER and return-to-clinic precautions discussed, patient verbalized understanding.    Jaynee Eagles, [place] 01/25/19 1658

## 2019-01-25 NOTE — Discharge Instructions (Addendum)
Please make sure you are drinking plain water. Try to drink between 48-64 ounces of water. Please limit drinks that are urinary irritants such as coffee, soda, sweet or unsweet tea.

## 2019-01-25 NOTE — ED Triage Notes (Signed)
Reports starting last night with suprapubic abd pain and today has had some hematuria.  Denies fevers.

## 2019-01-26 LAB — URINE CULTURE: Culture: NO GROWTH

## 2019-02-02 DIAGNOSIS — G4733 Obstructive sleep apnea (adult) (pediatric): Secondary | ICD-10-CM | POA: Diagnosis not present

## 2019-02-26 DIAGNOSIS — M159 Polyosteoarthritis, unspecified: Secondary | ICD-10-CM | POA: Diagnosis not present

## 2019-02-26 DIAGNOSIS — I10 Essential (primary) hypertension: Secondary | ICD-10-CM | POA: Diagnosis not present

## 2019-04-06 DIAGNOSIS — M545 Low back pain: Secondary | ICD-10-CM | POA: Diagnosis not present

## 2019-04-06 DIAGNOSIS — J449 Chronic obstructive pulmonary disease, unspecified: Secondary | ICD-10-CM | POA: Diagnosis not present

## 2019-04-06 DIAGNOSIS — Z720 Tobacco use: Secondary | ICD-10-CM | POA: Diagnosis not present

## 2019-04-06 DIAGNOSIS — Z7189 Other specified counseling: Secondary | ICD-10-CM | POA: Diagnosis not present

## 2019-04-06 DIAGNOSIS — I10 Essential (primary) hypertension: Secondary | ICD-10-CM | POA: Diagnosis not present

## 2019-04-10 DIAGNOSIS — M25552 Pain in left hip: Secondary | ICD-10-CM | POA: Diagnosis not present

## 2019-04-16 DIAGNOSIS — M7062 Trochanteric bursitis, left hip: Secondary | ICD-10-CM | POA: Diagnosis not present

## 2019-04-16 DIAGNOSIS — M25552 Pain in left hip: Secondary | ICD-10-CM | POA: Diagnosis not present

## 2019-04-22 DIAGNOSIS — M7062 Trochanteric bursitis, left hip: Secondary | ICD-10-CM | POA: Diagnosis not present

## 2019-05-12 DIAGNOSIS — M545 Low back pain: Secondary | ICD-10-CM | POA: Diagnosis not present

## 2019-05-12 DIAGNOSIS — M7062 Trochanteric bursitis, left hip: Secondary | ICD-10-CM | POA: Diagnosis not present

## 2019-05-13 ENCOUNTER — Other Ambulatory Visit: Payer: Self-pay | Admitting: Cardiovascular Disease

## 2019-05-14 DIAGNOSIS — G4733 Obstructive sleep apnea (adult) (pediatric): Secondary | ICD-10-CM | POA: Diagnosis not present

## 2019-05-15 ENCOUNTER — Other Ambulatory Visit: Payer: Self-pay

## 2019-05-15 ENCOUNTER — Other Ambulatory Visit: Payer: Self-pay | Admitting: Physical Medicine and Rehabilitation

## 2019-05-15 DIAGNOSIS — M545 Low back pain, unspecified: Secondary | ICD-10-CM

## 2019-05-15 NOTE — Patient Outreach (Signed)
[facility]) Care Management  05/15/2019  Morgan Torres 09-Jan-1951 [id number]   Medication Adherence call to Morgan Torres [facility] Identifiers Verify spoke with patient she is past due on Losartan/Hctz 100/12.5 mg,patient explain she takes 1 tablet daily and has medication but ask if we can order from [place] will have the prescription ready for patient on Nov.22 because she pick up late last time.Morgan Torres is showing past due under [place].   [facility] Management Direct Dial [phone]  Fax [phone] [email or web address]

## 2019-05-19 DIAGNOSIS — G4762 Sleep related leg cramps: Secondary | ICD-10-CM | POA: Diagnosis not present

## 2019-05-19 DIAGNOSIS — R739 Hyperglycemia, unspecified: Secondary | ICD-10-CM | POA: Diagnosis not present

## 2019-05-31 ENCOUNTER — Other Ambulatory Visit: Payer: Self-pay

## 2019-05-31 ENCOUNTER — Ambulatory Visit
Admission: RE | Admit: 2019-05-31 | Discharge: 2019-05-31 | Disposition: A | Discharge status: Home / Self Care | Payer: Medicare Other | Source: Ambulatory Visit | Attending: Physical Medicine and Rehabilitation | Admitting: Physical Medicine and Rehabilitation

## 2019-05-31 DIAGNOSIS — M545 Low back pain, unspecified: Secondary | ICD-10-CM

## 2019-05-31 DIAGNOSIS — M48061 Spinal stenosis, lumbar region without neurogenic claudication: Secondary | ICD-10-CM | POA: Diagnosis not present

## 2019-06-04 DIAGNOSIS — M545 Low back pain: Secondary | ICD-10-CM | POA: Diagnosis not present

## 2019-06-11 DIAGNOSIS — M7061 Trochanteric bursitis, right hip: Secondary | ICD-10-CM | POA: Diagnosis not present

## 2019-07-22 ENCOUNTER — Other Ambulatory Visit: Payer: Self-pay

## 2019-07-22 ENCOUNTER — Ambulatory Visit (HOSPITAL_COMMUNITY)
Admission: EM | Admit: 2019-07-22 | Discharge: 2019-07-22 | Disposition: A | Discharge status: Home / Self Care | Payer: Medicare Other | Attending: Urgent Care | Admitting: Urgent Care

## 2019-07-22 ENCOUNTER — Encounter (HOSPITAL_COMMUNITY): Payer: Self-pay

## 2019-07-22 DIAGNOSIS — H938X2 Other specified disorders of left ear: Secondary | ICD-10-CM

## 2019-07-22 DIAGNOSIS — Z20822 Contact with and (suspected) exposure to covid-19: Secondary | ICD-10-CM | POA: Diagnosis not present

## 2019-07-22 DIAGNOSIS — Z7982 Long term (current) use of aspirin: Secondary | ICD-10-CM | POA: Insufficient documentation

## 2019-07-22 DIAGNOSIS — J449 Chronic obstructive pulmonary disease, unspecified: Secondary | ICD-10-CM | POA: Diagnosis not present

## 2019-07-22 DIAGNOSIS — F17210 Nicotine dependence, cigarettes, uncomplicated: Secondary | ICD-10-CM | POA: Diagnosis not present

## 2019-07-22 DIAGNOSIS — Z8249 Family history of ischemic heart disease and other diseases of the circulatory system: Secondary | ICD-10-CM | POA: Insufficient documentation

## 2019-07-22 DIAGNOSIS — I10 Essential (primary) hypertension: Secondary | ICD-10-CM | POA: Diagnosis not present

## 2019-07-22 DIAGNOSIS — J3489 Other specified disorders of nose and nasal sinuses: Secondary | ICD-10-CM | POA: Diagnosis not present

## 2019-07-22 DIAGNOSIS — I252 Old myocardial infarction: Secondary | ICD-10-CM | POA: Diagnosis not present

## 2019-07-22 DIAGNOSIS — Z79899 Other long term (current) drug therapy: Secondary | ICD-10-CM | POA: Insufficient documentation

## 2019-07-22 DIAGNOSIS — Z885 Allergy status to narcotic agent status: Secondary | ICD-10-CM | POA: Diagnosis not present

## 2019-07-22 DIAGNOSIS — J3089 Other allergic rhinitis: Secondary | ICD-10-CM | POA: Diagnosis not present

## 2019-07-22 DIAGNOSIS — I2510 Atherosclerotic heart disease of native coronary artery without angina pectoris: Secondary | ICD-10-CM | POA: Diagnosis not present

## 2019-07-22 DIAGNOSIS — R0982 Postnasal drip: Secondary | ICD-10-CM | POA: Insufficient documentation

## 2019-07-22 DIAGNOSIS — M797 Fibromyalgia: Secondary | ICD-10-CM | POA: Insufficient documentation

## 2019-07-22 DIAGNOSIS — M542 Cervicalgia: Secondary | ICD-10-CM | POA: Insufficient documentation

## 2019-07-22 DIAGNOSIS — H9202 Otalgia, left ear: Secondary | ICD-10-CM | POA: Diagnosis not present

## 2019-07-22 MED ORDER — CETIRIZINE HCL 10 MG PO TABS: 10.0000 mg | ORAL_TABLET | Freq: Every day | ORAL | 0 refills | Status: DC

## 2019-07-22 MED ORDER — PREDNISONE 10 MG PO TABS: 30.0000 mg | ORAL_TABLET | Freq: Every day | ORAL | 0 refills | Status: DC

## 2019-07-22 NOTE — ED Notes (Signed)
150/62 reported to [employer]

## 2019-07-22 NOTE — ED Triage Notes (Signed)
Pt states she has left ear pain and she has neck pain x this has been going on sense last Saturday. Pt states she has used warm compress and muscle rub on her neck.

## 2019-07-22 NOTE — ED Provider Notes (Signed)
[place]   MRN: [id number] DOB: 26-Jun-1950  Subjective:   Morgan Torres is a 69 y.o. female presenting for 4-day history of persistent left ear fullness, runny and stuffy nose, scratchy sore throat, dry cough.  Patient was recently tested for COVID-19 and was negative.  However she has since had another exposure in the past few days and would like to be retested.  She has history of allergic rhinitis and asthma, uses albuterol inhaler as needed but does not take any medications for her allergic rhinitis.  No current facility-administered medications for this encounter.  Current Outpatient Medications:  .  acetaminophen (TYLENOL) 500 MG tablet, Take 1,000 mg every 6 (six) hours as needed by mouth for mild pain., Disp: , Rfl:  .  albuterol (PROVENTIL HFA;VENTOLIN HFA) 108 (90 Base) MCG/ACT inhaler, Inhale 2 puffs into the lungs every 6 (six) hours as needed for wheezing or shortness of breath., Disp: 1 Inhaler, Rfl: 0 .  amLODipine (NORVASC) 5 MG tablet, TAKE 1 TABLET BY MOUTH EVERY DAY, Disp: 90 tablet, Rfl: 2 .  aspirin 81 MG tablet, Take 81 mg by mouth daily., Disp: , Rfl:  .  cephALEXin (KEFLEX) 500 MG capsule, Take 1 capsule (500 mg total) by mouth 2 (two) times daily., Disp: 10 capsule, Rfl: 0 .  EPINEPHrine (EPIPEN 2-PAK) 0.3 mg/0.3 mL DEVI, Inject 0.3 mLs (0.3 mg total) into the muscle once as needed (for severe allergic reaction). CAll 911 immediately if you have to use this medicine, Disp: 1 Device, Rfl: 1 .  gabapentin (NEURONTIN) 300 MG capsule, Take 300 mg by mouth at bedtime., Disp: , Rfl: 2 .  LORazepam (ATIVAN) 1 MG tablet, Take 1 mg by mouth daily as needed for anxiety., Disp: , Rfl:  .  LOSARTAN POTASSIUM-HCTZ PO, Take by mouth., Disp: , Rfl:  .  metoprolol tartrate (LOPRESSOR) 25 MG tablet, TAKE 1/2 TAB BY MOUTH 2 TIMES DAILY. PLEASE SCHEDULE YEARLY APPT FOR MORE REFILLS, THANKS!, Disp: 90 tablet, Rfl: 1 .  montelukast (SINGULAIR) 10 MG tablet, Take 10 mg by  mouth at bedtime., Disp: , Rfl:  .  nitroGLYCERIN (NITROSTAT) 0.4 MG SL tablet, Place 1 tablet (0.4 mg total) under the tongue every 5 (five) minutes x 3 doses as needed for chest pain., Disp: 25 tablet, Rfl: 2   Allergies  Allergen Reactions  . Diclofenac Anaphylaxis  . Iodinated Diagnostic Agents Other (See Comments)  . Codeine Itching  . Hydrocodone Itching    Past Medical History:  Diagnosis Date  . Anxiety   . Arthritis    hip, lumbar spine   . Asthma    seasonal allergies   . Bronchitis    Hx: of  . COPD (chronic obstructive pulmonary disease) ([place])   . Coronary artery disease   . Depression   . Fibromyalgia   . Hypertension   . Lumbar herniated disc   . Myocardial infarction [facility]) 2014   followed by Dr. Debara Pickett, treated medically, no stents  . Pneumonia    Hx: of  . Sciatica   . Small bowel obstruction [facility])      Past Surgical History:  Procedure Laterality Date  . ABDOMINAL HYSTERECTOMY    . ABDOMINAL SURGERY     resection of small intestine  . BACK SURGERY     fusion  . CARDIAC CATHETERIZATION  2014  . COLON SURGERY     resection for bowel - for obstruction   . COLONOSCOPY     Hx; of  .  DILATION AND CURETTAGE OF UTERUS    . HERNIA REPAIR  1970's   inguinal hernia   . LEFT HEART CATHETERIZATION WITH CORONARY ANGIOGRAM N/A 03/30/2013   Procedure: LEFT HEART CATHETERIZATION WITH CORONARY ANGIOGRAM;  Surgeon: Leonie Man, MD;  Location: [facility] CATH LAB;  Service: Cardiovascular;  Laterality: N/A;  . SALIVARY GLAND SURGERY Left 1970-1980   approached from inside mouth & side of neck  . TONSILLECTOMY    . TOTAL HIP ARTHROPLASTY Left 05/28/2014   Procedure: LEFT TOTAL HIP ARTHROPLASTY;  Surgeon: Yvette Rack., MD;  Location: [facility];  Service: Orthopedics;  Laterality: Left;  . TOTAL HIP ARTHROPLASTY Left 05/28/2014   dr caffrey    Family History  Problem Relation Age of Onset  . Hypertension Mother   . Diabetes Mother   . Cancer - Other Mother   .  Cancer Sister     Social History   Tobacco Use  . Smoking status: Current Every Day Smoker    Packs/day: 0.40    Years: 25.00    Pack years: 10.00    Types: Cigarettes  . Smokeless tobacco: Never Used  . Tobacco comment: Currently on the Nicoderm patch."smokes a few"  Substance Use Topics  . Alcohol use: Yes    Comment: occasionally  . Drug use: No    Review of Systems  Constitutional: Negative for fever and malaise/fatigue.  HENT: Positive for congestion, ear pain and sore throat. Negative for sinus pain.   Eyes: Negative for discharge and redness.  Respiratory: Positive for cough. Negative for hemoptysis, shortness of breath and wheezing.   Cardiovascular: Negative for chest pain.  Gastrointestinal: Negative for abdominal pain, diarrhea, nausea and vomiting.  Genitourinary: Negative for dysuria, flank pain and hematuria.  Musculoskeletal: Negative for myalgias.  Skin: Negative for rash.  Neurological: Negative for dizziness, weakness and headaches.  Psychiatric/Behavioral: Negative for depression and substance abuse.     Objective:   Vitals: BP (!) 150/62 (BP Location: Right Arm)   Pulse 76   Temp 98.1 F (36.7 C) (Oral)   Resp 18   Wt 250 lb (113.4 kg)   SpO2 100%   BMI 38.01 kg/m   Physical Exam Constitutional:      General: She is not in acute distress.    Appearance: Normal appearance. She is well-developed. She is obese. She is not ill-appearing, toxic-appearing or diaphoretic.  HENT:     Head: Normocephalic and atraumatic.     Right Ear: Tympanic membrane, ear canal and external ear normal. No drainage or tenderness. No middle ear effusion. There is no impacted cerumen. Tympanic membrane is not erythematous.     Left Ear: Tympanic membrane, ear canal and external ear normal. No drainage or tenderness.  No middle ear effusion. There is no impacted cerumen. Tympanic membrane is not erythematous.     Nose: Congestion present. No rhinorrhea.      Mouth/Throat:     Mouth: Mucous membranes are moist. No oral lesions.     Pharynx: No pharyngeal swelling, oropharyngeal exudate, posterior oropharyngeal erythema or uvula swelling.     Tonsils: No tonsillar exudate or tonsillar abscesses.     Comments: Pnd overlying pharynx. Eyes:     General: No scleral icterus.       Right eye: No discharge.        Left eye: No discharge.     Extraocular Movements:     Right eye: Normal extraocular motion.     Left eye: Normal extraocular motion.  Conjunctiva/sclera: Conjunctivae normal.     Pupils: Pupils are equal, round, and reactive to light.  Neck:     Vascular: No carotid bruit.  Cardiovascular:     Rate and Rhythm: Normal rate.  Pulmonary:     Effort: Pulmonary effort is normal.  Musculoskeletal:     Cervical back: Normal range of motion and neck supple. No rigidity or tenderness.  Lymphadenopathy:     Cervical: No cervical adenopathy.  Skin:    General: Skin is warm and dry.  Neurological:     General: No focal deficit present.     Mental Status: She is alert and oriented to person, place, and time.  Psychiatric:        Mood and Affect: Mood normal.        Behavior: Behavior normal.        Thought Content: Thought content normal.        Judgment: Judgment normal.     Assessment and Plan :   1. Allergic rhinitis due to other allergic trigger, unspecified seasonality   2. Stuffy and runny nose   3. Post-nasal drainage   4. Ear fullness, left   5. Left ear pain   6. Neck pain     Will address sx for allergic rhinitis with prednisone course. Emphasized need to restart allergy tx regimen. COVID 19 testing pending. Use albuterol inhaler prn. Counseled patient on potential for adverse effects with medications prescribed/recommended today, ER and return-to-clinic precautions discussed, patient verbalized understanding.    Jaynee Eagles, PA-C 07/22/19 1525

## 2019-07-24 LAB — NOVEL CORONAVIRUS, NAA (HOSP ORDER, SEND-OUT TO REF LAB; TAT 18-24 HRS): SARS-CoV-2, NAA: NOT DETECTED

## 2019-08-12 DIAGNOSIS — M545 Low back pain: Secondary | ICD-10-CM | POA: Diagnosis not present

## 2019-08-12 DIAGNOSIS — G4733 Obstructive sleep apnea (adult) (pediatric): Secondary | ICD-10-CM | POA: Diagnosis not present

## 2019-08-12 DIAGNOSIS — M546 Pain in thoracic spine: Secondary | ICD-10-CM | POA: Diagnosis not present

## 2019-08-13 DIAGNOSIS — G4733 Obstructive sleep apnea (adult) (pediatric): Secondary | ICD-10-CM | POA: Diagnosis not present

## 2019-08-17 DIAGNOSIS — R739 Hyperglycemia, unspecified: Secondary | ICD-10-CM | POA: Diagnosis not present

## 2019-08-17 DIAGNOSIS — G4762 Sleep related leg cramps: Secondary | ICD-10-CM | POA: Diagnosis not present

## 2019-08-17 DIAGNOSIS — J449 Chronic obstructive pulmonary disease, unspecified: Secondary | ICD-10-CM | POA: Diagnosis not present

## 2019-08-22 DIAGNOSIS — J449 Chronic obstructive pulmonary disease, unspecified: Secondary | ICD-10-CM | POA: Diagnosis not present

## 2019-08-22 DIAGNOSIS — I10 Essential (primary) hypertension: Secondary | ICD-10-CM | POA: Diagnosis not present

## 2019-08-22 DIAGNOSIS — M545 Low back pain: Secondary | ICD-10-CM | POA: Diagnosis not present

## 2019-09-21 DIAGNOSIS — M545 Low back pain: Secondary | ICD-10-CM | POA: Diagnosis not present

## 2019-09-21 DIAGNOSIS — D179 Benign lipomatous neoplasm, unspecified: Secondary | ICD-10-CM | POA: Diagnosis not present

## 2019-10-04 IMAGING — US US ABDOMEN COMPLETE
1 series · 14 of 25 positions shown · non-contrast
Comparison: CT abdomen pelvis 07/13/2010

CLINICAL DATA: Gross hematuria

EXAM:
ABDOMEN ULTRASOUND COMPLETE

[Series 1: us abdomen complete · 0.19mm/px · 14 of 86 slices shown]
[im 1/86]
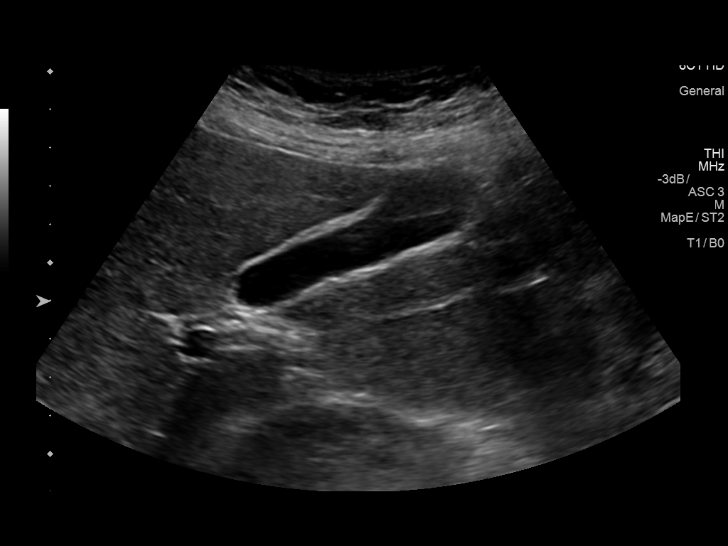
[im 8/86]
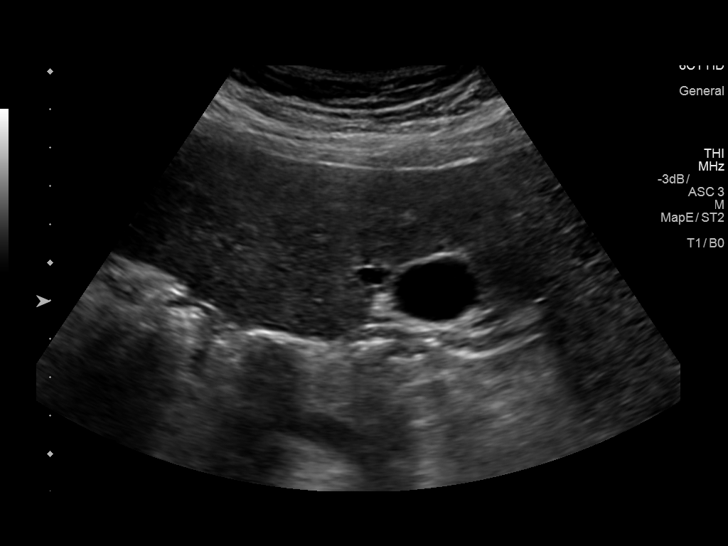
[im 15/86]
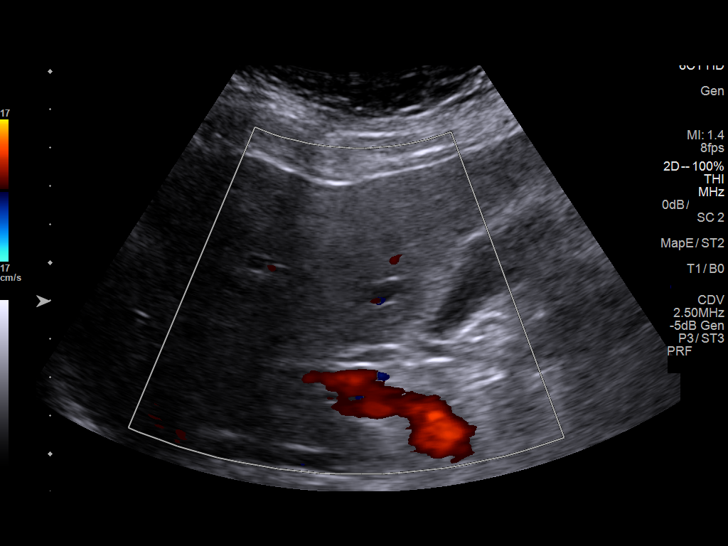
[im 22/86]
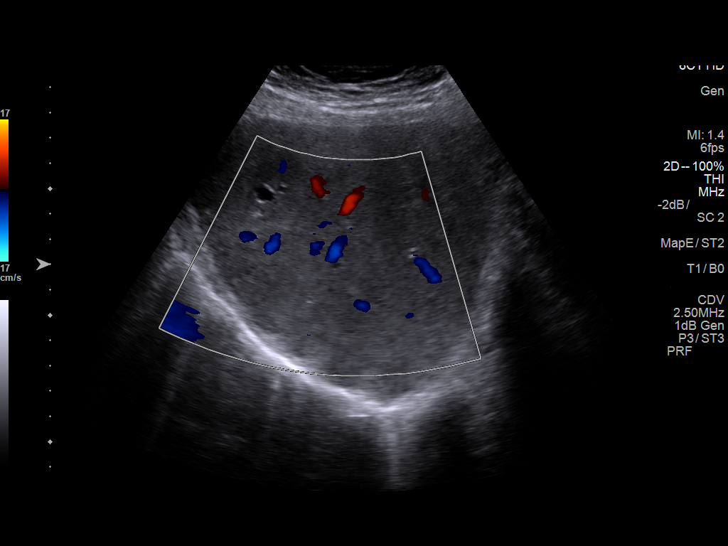
[im 29/86]
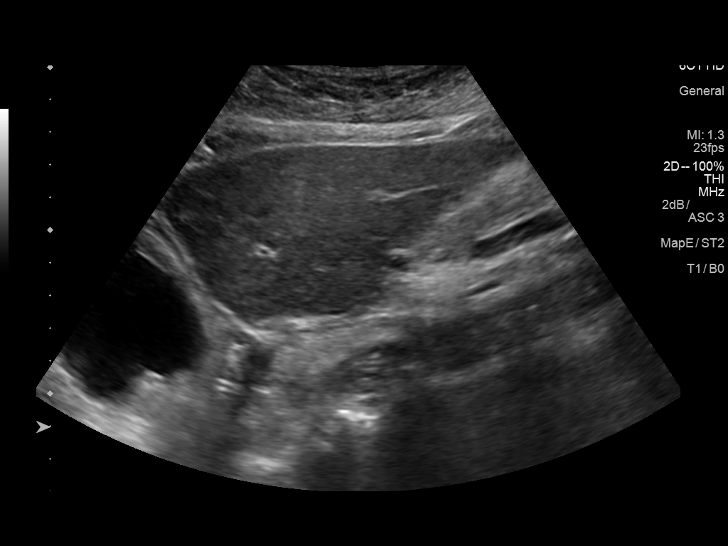
[im 32/86]
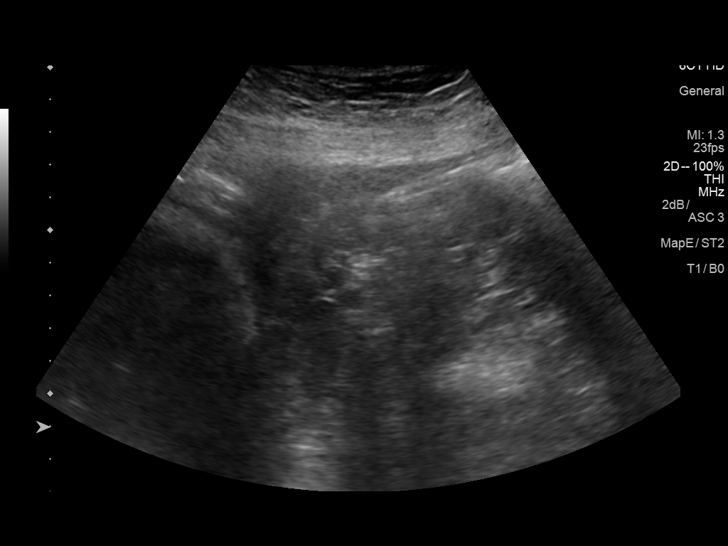
[im 39/86]
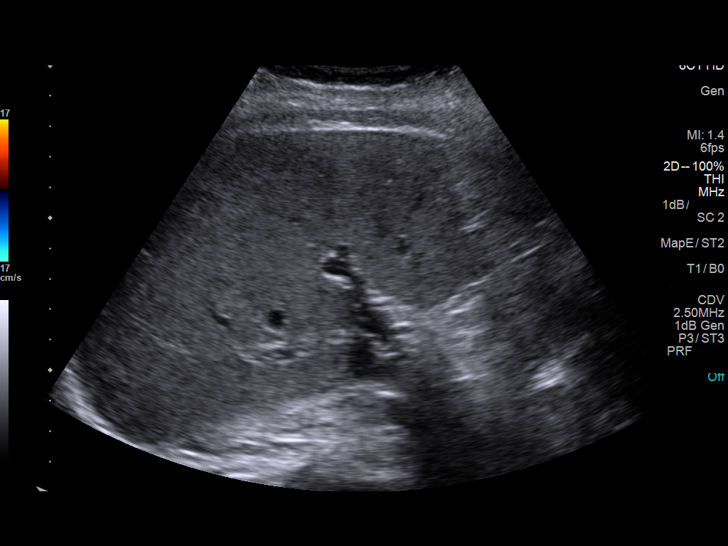
[im 47/86]
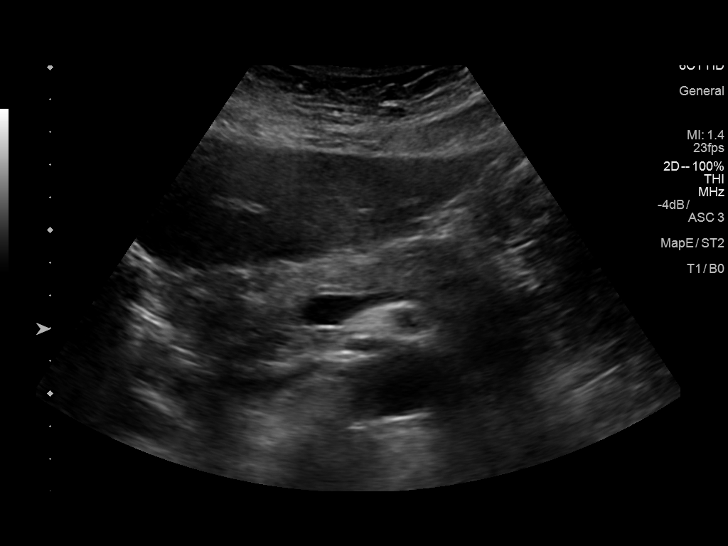
[im 54/86]
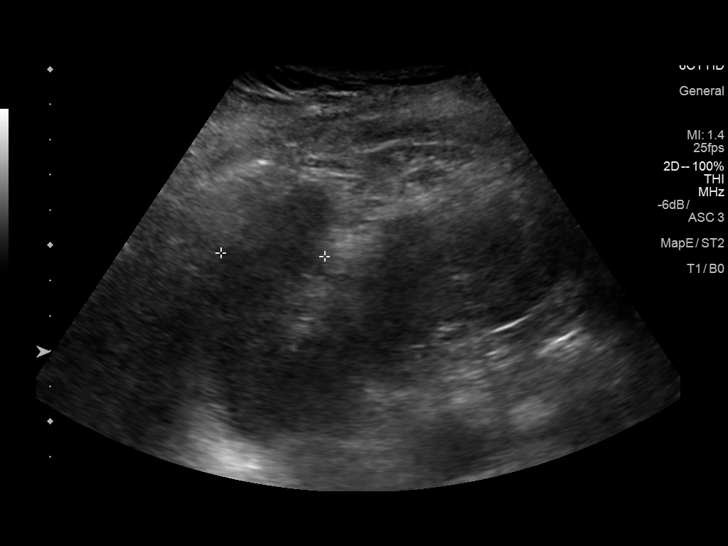
[im 57/86]
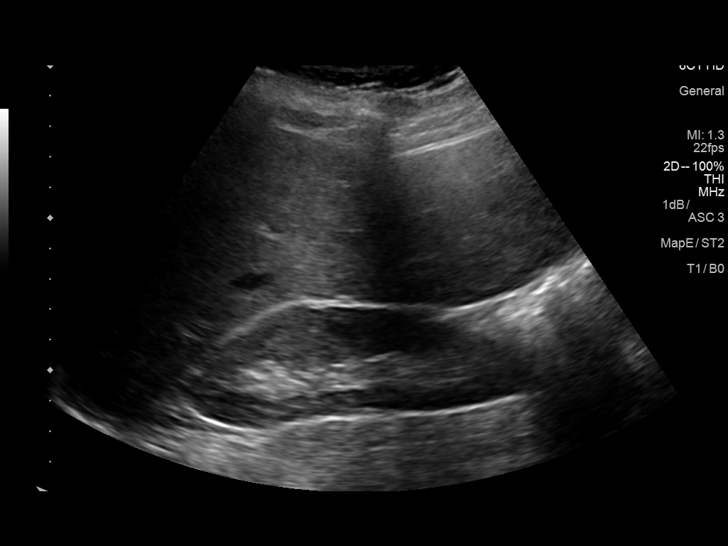
[im 64/86]
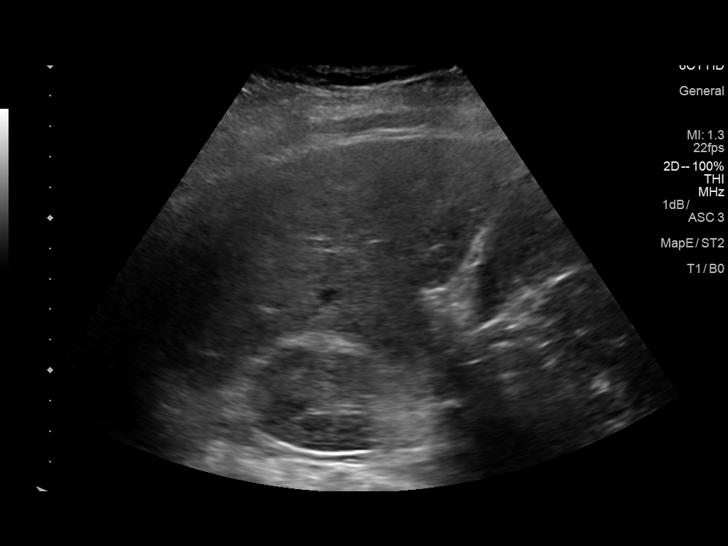
[im 71/86]
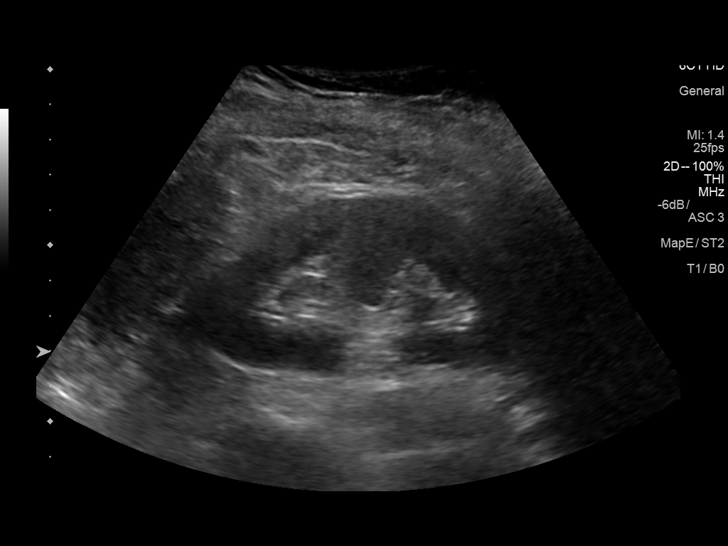
[im 78/86]
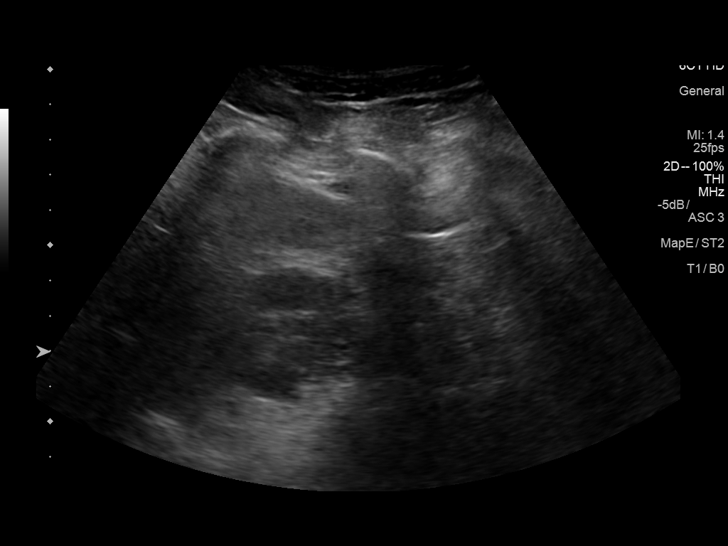
[im 86/86]
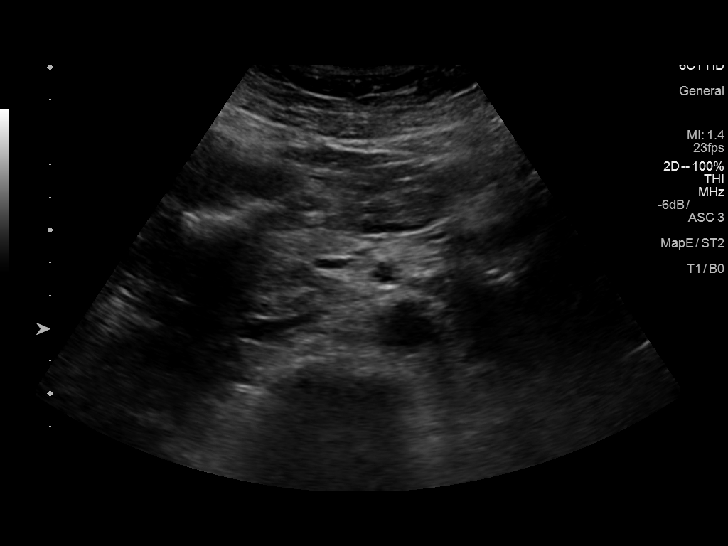

[14 of 25 positions shown; findings below may reference images not displayed]

FINDINGS: Gallbladder: No gallstones or wall thickening visualized. No
sonographic Murphy sign noted by sonographer.

Common bile duct: Diameter: 3.9 mm

Liver: 8 mm cyst right lobe of liver. No mass lesion. portal vein is
patent on color Doppler imaging with normal direction of blood flow
towards the liver.

IVC: No abnormality visualized.

Pancreas: Visualized portion unremarkable.

Spleen: Size and appearance within normal limits.

Right Kidney: Length: 10.7 cm. Echogenicity within normal limits. No
mass or hydronephrosis visualized.

Left Kidney: Length: 10.5 cm. Echogenicity within normal limits. No
mass or hydronephrosis visualized.

Abdominal aorta: No aneurysm visualized.

Other findings: None.
IMPRESSION: Negative ultrasound abdomen.

## 2019-10-05 DIAGNOSIS — M25511 Pain in right shoulder: Secondary | ICD-10-CM | POA: Diagnosis not present

## 2019-10-12 ENCOUNTER — Other Ambulatory Visit: Payer: Self-pay | Admitting: Cardiovascular Disease

## 2019-10-22 ENCOUNTER — Ambulatory Visit (HOSPITAL_COMMUNITY)
Admission: EM | Admit: 2019-10-22 | Discharge: 2019-10-22 | Disposition: A | Discharge status: Home / Self Care | Payer: Medicare Other | Attending: Physician Assistant | Admitting: Physician Assistant

## 2019-10-22 ENCOUNTER — Other Ambulatory Visit: Payer: Self-pay

## 2019-10-22 ENCOUNTER — Encounter (HOSPITAL_COMMUNITY): Payer: Self-pay

## 2019-10-22 DIAGNOSIS — M62838 Other muscle spasm: Secondary | ICD-10-CM | POA: Diagnosis not present

## 2019-10-22 DIAGNOSIS — M25511 Pain in right shoulder: Secondary | ICD-10-CM

## 2019-10-22 MED ORDER — ACETAMINOPHEN 500 MG PO TABS: 1000.0000 mg | ORAL_TABLET | Freq: Four times a day (QID) | ORAL | 0 refills | Status: DC | PRN

## 2019-10-22 MED ORDER — CYCLOBENZAPRINE HCL 10 MG PO TABS: 5.0000 mg | ORAL_TABLET | Freq: Every day | ORAL | 0 refills | Status: DC

## 2019-10-22 MED ORDER — PREDNISONE 10 MG PO TABS: 30.0000 mg | ORAL_TABLET | Freq: Every day | ORAL | 0 refills | Status: AC

## 2019-10-22 NOTE — ED Provider Notes (Signed)
[place]    CSN: [id number] Arrival date & time: 10/22/19  1813      History   Chief Complaint Chief Complaint  Patient presents with  . Shoulder Pain    HPI Morgan Torres is a 69 y.o. female.   Patient presents for right shoulder pain.  She reports this is been ongoing for at least 2 weeks.  She reports is been worsening recently.  She reports it hurts to move above her head.  She reports it is improved with rest.  She reports it hurts more when she lays on her left side at night and is improved when she lays on the right side with a heating pad.  She has tried over-the-counter medicines with minimal relief.  She denies any history of injury or surgeries to the right shoulder.  She is right-handed.  She reports numerous activities previously however has had them decreased due to Covid.  She denies any fevers or chills.  Denies numbness tingling or weakness in the lower part of her arm.  She does report that she feels a little weak when trying to raise the right arm to the side.   She reports also having upper back tension and pain on the right side.  She points to her upper trap and scapular area.  She denies neck pain.  Denies injury.     Past Medical History:  Diagnosis Date  . Anxiety   . Arthritis    hip, lumbar spine   . Asthma    seasonal allergies   . Bronchitis    Hx: of  . COPD (chronic obstructive pulmonary disease) ([place])   . Coronary artery disease   . Depression   . Fibromyalgia   . Hypertension   . Lumbar herniated disc   . Myocardial infarction [facility]) 2014   followed by Dr. Debara Pickett, treated medically, no stents  . Pneumonia    Hx: of  . Sciatica   . Small bowel obstruction [facility])     Patient Active Problem List   Diagnosis Date Noted  . Morbid obesity, unspecified obesity type ([place]) 08/28/2016  . Non-restorative sleep 08/28/2016  . Snoring 08/28/2016  . Hip pain 06/03/2014  . Acute blood loss anemia 06/01/2014  . Osteoarthritis of  left hip 05/28/2014  . Obesity (BMI 30.0-34.9) 04/20/2013  . Nonischemic cardiomyopathy ([place]) 04/01/2013  . Smoking 04/01/2013  . NSTEMI - Troponin pk 1.5 with ant TWI on EKG 03/29/2013  . Spinal stenosis, lumbar region, with neurogenic claudication 01/23/2013  . VAGINAL TRICHOMONIASIS 06/28/2006  . Hyperlipidemia 06/28/2006  . ANEMIA-NOS 06/28/2006  . ANXIETY 06/28/2006  . DEPRESSION 06/28/2006  . SYNDROME, CARPAL TUNNEL 06/28/2006  . Essential hypertension 06/28/2006  . ALLERGIC RHINITIS 06/28/2006  . ASTHMA 06/28/2006  . PAROTIDITIS 06/28/2006  . LOW BACK PAIN 06/28/2006  . TB SKIN TEST, POSITIVE, HX OF 06/28/2006    Past Surgical History:  Procedure Laterality Date  . ABDOMINAL HYSTERECTOMY    . ABDOMINAL SURGERY     resection of small intestine  . BACK SURGERY     fusion  . CARDIAC CATHETERIZATION  2014  . COLON SURGERY     resection for bowel - for obstruction   . COLONOSCOPY     Hx; of  . DILATION AND CURETTAGE OF UTERUS    . HERNIA REPAIR  1970's   inguinal hernia   . LEFT HEART CATHETERIZATION WITH CORONARY ANGIOGRAM N/A 03/30/2013   Procedure: LEFT HEART CATHETERIZATION WITH CORONARY ANGIOGRAM;  Surgeon:  Leonie Man, MD;  Location: [facility] CATH LAB;  Service: Cardiovascular;  Laterality: N/A;  . SALIVARY GLAND SURGERY Left 1970-1980   approached from inside mouth & side of neck  . TONSILLECTOMY    . TOTAL HIP ARTHROPLASTY Left 05/28/2014   Procedure: LEFT TOTAL HIP ARTHROPLASTY;  Surgeon: Yvette Rack., MD;  Location: [place];  Service: Orthopedics;  Laterality: Left;  . TOTAL HIP ARTHROPLASTY Left 05/28/2014   dr caffrey    OB History   No obstetric history on file.      Home Medications    Prior to Admission medications   Medication Sig Start Date End Date Taking? Authorizing Provider  acetaminophen (TYLENOL) 500 MG tablet Take 2 tablets (1,000 mg total) by mouth every 6 (six) hours as needed for mild pain. 10/22/19   Larkin Morelos, Marguerita Beards, PA-C  albuterol  (PROVENTIL HFA;VENTOLIN HFA) 108 (90 Base) MCG/ACT inhaler Inhale 2 puffs into the lungs every 6 (six) hours as needed for wheezing or shortness of breath. 05/10/16   Katy Apo, NP  amLODipine (NORVASC) 5 MG tablet Take 1 tablet (5 mg total) by mouth daily. Please schedule annual appt with Dr. Claiborne Billings for refills. [phone]. 1st attempt. 10/13/19   Troy Sine, MD  aspirin 81 MG tablet Take 81 mg by mouth daily.    [provider]  cephALEXin (KEFLEX) 500 MG capsule Take 1 capsule (500 mg total) by mouth 2 (two) times daily. 01/25/19   Jaynee Eagles, PA-C  cetirizine (ZYRTEC) 10 MG tablet Take 1 tablet (10 mg total) by mouth daily. 07/22/19   Jaynee Eagles, PA-C  cyclobenzaprine (FLEXERIL) 10 MG tablet Take 0.5 tablets (5 mg total) by mouth at bedtime. 10/22/19   Levon Penning, Marguerita Beards, PA-C  EPINEPHrine (EPIPEN 2-PAK) 0.3 mg/0.3 mL DEVI Inject 0.3 mLs (0.3 mg total) into the muscle once as needed (for severe allergic reaction). CAll 911 immediately if you have to use this medicine 11/26/12   Piepenbrink, Anderson Malta, PA-C  gabapentin (NEURONTIN) 300 MG capsule Take 300 mg by mouth at bedtime. 08/08/16   [provider]  LORazepam (ATIVAN) 1 MG tablet Take 1 mg by mouth daily as needed for anxiety.    [provider]  LOSARTAN POTASSIUM-HCTZ PO Take by mouth.    [provider]  metoprolol tartrate (LOPRESSOR) 25 MG tablet TAKE 1/2 TAB BY MOUTH 2 TIMES DAILY. PLEASE SCHEDULE YEARLY APPT FOR MORE REFILLS, THANKS! 05/13/19   Pixie Casino, MD  montelukast (SINGULAIR) 10 MG tablet Take 10 mg by mouth at bedtime.    [provider]  nitroGLYCERIN (NITROSTAT) 0.4 MG SL tablet Place 1 tablet (0.4 mg total) under the tongue every 5 (five) minutes x 3 doses as needed for chest pain. 09/25/18   Hilty, Nadean Corwin, MD  predniSONE (DELTASONE) 10 MG tablet Take 3 tablets (30 mg total) by mouth daily for 5 days. 10/22/19 10/27/19  Lemmie Vanlanen, Marguerita Beards, PA-C    Family History Family History    Problem Relation Age of Onset  . Hypertension Mother   . Diabetes Mother   . Cancer - Other Mother   . Cancer Sister     Social History Social History   Tobacco Use  . Smoking status: Current Every Day Smoker    Packs/day: 0.40    Years: 25.00    Pack years: 10.00    Types: Cigarettes  . Smokeless tobacco: Never Used  . Tobacco comment: Currently on the Nicoderm patch."smokes a few"  Substance  Use Topics  . Alcohol use: Yes    Comment: occasionally  . Drug use: No     Allergies   Diclofenac, Iodinated diagnostic agents, Codeine, and Hydrocodone   Review of Systems Review of Systems   Physical Exam Triage Vital Signs ED Triage Vitals  Enc Vitals Group     BP 10/22/19 1836 124/78     Pulse Rate 10/22/19 1836 67     Resp 10/22/19 1836 18     Temp 10/22/19 1836 98 F (36.7 C)     Temp Source 10/22/19 1836 Oral     SpO2 10/22/19 1836 99 %     Weight 10/22/19 1834 235 lb (106.6 kg)     Height --      Head Circumference --      Peak Flow --      Pain Score 10/22/19 1834 8     Pain Loc --      Pain Edu? --      Excl. in [place]? --    No data found.  Updated Vital Signs BP 124/78 (BP Location: Right Arm)   Pulse 67   Temp 98 F (36.7 C) (Oral)   Resp 18   Wt 235 lb (106.6 kg)   SpO2 99%   BMI 35.73 kg/m   Visual Acuity Right Eye Distance:   Left Eye Distance:   Bilateral Distance:    Right Eye Near:   Left Eye Near:    Bilateral Near:     Physical Exam Vitals and nursing note reviewed.  Constitutional:      General: She is not in acute distress.    Appearance: She is well-developed. She is not ill-appearing.  HENT:     Head: Normocephalic and atraumatic.  Eyes:     Conjunctiva/sclera: Conjunctivae normal.  Neck:     Comments: There is spasm in the right trapezius. No midline tenderness. Cardiovascular:     Rate and Rhythm: Normal rate and regular rhythm.     Heart sounds: No murmur.  Pulmonary:     Effort: Pulmonary effort is normal. No  respiratory distress.     Breath sounds: Normal breath sounds.  Musculoskeletal:     Cervical back: Neck supple. Tenderness present.     Comments: No deformity, erythema or ecchymosis of the right shoulder. Patient has limited range of motion with shoulder flexion and extension. Patient unable to actively raise shoulder above parallel laterally. Passive range of motion is full. She does have weakness with lowering of arm. There is tenderness to palpation over the posterior and anterior shoulder to include bicipital groove. Pain elicited with empty can test.  Strength 5/5 the elbow and grip. Sensation grossly intact equally bilaterally. Pulses equal bilaterally.  Skin:    General: Skin is warm and dry.     Findings: No rash.  Neurological:     Mental Status: She is alert.      UC Treatments / Results  Labs (all labs ordered are listed, but only abnormal results are displayed) Labs Reviewed - No data to display  EKG   Radiology No results found.  Procedures Procedures (including critical care time)  Medications Ordered in UC Medications - No data to display  Initial Impression / Assessment and Plan / UC Course  I have reviewed the triage vital signs and the nursing notes.  Pertinent labs & imaging results that were available during my care of the patient were reviewed by me and considered in my medical decision making (see  chart for details).     #Acute right shoulder pain #Muscle spasm Patient is a 69 year old female presenting with acute right shoulder pain and muscle spasm of the neck and upper back muscles. Differential for shoulder pain would include rotator cuff pathology vs biceps tendinitis vs shoulder impingement syndrome vs bursitis. Believe all will respond to anti-inflammatory, so will start with prednisone and Tylenol. Patient reports she has taken ibuprofen however has listed anaphylactic reaction to diclofenac. Discussed stopping NSAID therapies for now. Will  trial 5 mg muscle relaxer. Discussed side effects of this. Will give sling for comfort. Recommended follow-up with sports medicine group for further evaluation. Patient verbalized understanding the plan. Final Clinical Impressions(s) / UC Diagnoses   Final diagnoses:  Acute pain of right shoulder  Muscle spasm     Discharge Instructions     Take the prednisone 3 times a day for the next 5 days Take 2 extra strength Tylenol every 8 hours Take the muscle relaxer 5 mg/ 1/2 tablet at night.  This may make you drowsy so do not drive or drink alcohol after taking this  You may wear the sling for comfort.  Follow-up with the sports medicine group for further evaluation.     ED Prescriptions    Medication Sig Dispense Auth. Provider   predniSONE (DELTASONE) 10 MG tablet Take 3 tablets (30 mg total) by mouth daily for 5 days. 15 tablet Saphyra Hutt, Marguerita Beards, PA-C   acetaminophen (TYLENOL) 500 MG tablet Take 2 tablets (1,000 mg total) by mouth every 6 (six) hours as needed for mild pain. 30 tablet Carson Bogden, Marguerita Beards, PA-C   cyclobenzaprine (FLEXERIL) 10 MG tablet Take 0.5 tablets (5 mg total) by mouth at bedtime. 20 tablet Janely Gullickson, Marguerita Beards, PA-C     PDMP not reviewed this encounter.   Purnell Shoemaker, PA-C 10/22/19 2133

## 2019-10-22 NOTE — Discharge Instructions (Signed)
Take the prednisone 3 times a day for the next 5 days Take 2 extra strength Tylenol every 8 hours Take the muscle relaxer 5 mg/ 1/2 tablet at night.  This may make you drowsy so do not drive or drink alcohol after taking this  You may wear the sling for comfort.  Follow-up with the sports medicine group for further evaluation.

## 2019-10-22 NOTE — ED Triage Notes (Signed)
Pt states she has right shoulder and arm pain. X 2 weeks or more.

## 2019-11-02 ENCOUNTER — Other Ambulatory Visit: Payer: Self-pay | Admitting: Internal Medicine

## 2019-11-12 DIAGNOSIS — M542 Cervicalgia: Secondary | ICD-10-CM | POA: Diagnosis not present

## 2019-11-12 DIAGNOSIS — M5412 Radiculopathy, cervical region: Secondary | ICD-10-CM | POA: Diagnosis not present

## 2019-11-13 ENCOUNTER — Other Ambulatory Visit: Payer: Self-pay | Admitting: Cardiovascular Disease

## 2019-11-14 LAB — GLUCOSE, POCT (MANUAL RESULT ENTRY): POC Glucose: 150 mg/dl — AB (ref 70–99)

## 2019-11-19 DIAGNOSIS — M542 Cervicalgia: Secondary | ICD-10-CM | POA: Diagnosis not present

## 2019-11-19 NOTE — Telephone Encounter (Signed)
Patient is overdue for follow up 1 year office visit, no answer when I called to see if I could get her scheduled. Have to give 15 day supply of amlodipine 5 mg daily until f/u appt is made.

## 2019-11-22 ENCOUNTER — Ambulatory Visit (HOSPITAL_COMMUNITY)
Admission: EM | Admit: 2019-11-22 | Discharge: 2019-11-22 | Disposition: A | Discharge status: Home / Self Care | Payer: Medicare Other | Attending: Family Medicine | Admitting: Family Medicine

## 2019-11-22 ENCOUNTER — Encounter (HOSPITAL_COMMUNITY): Payer: Self-pay

## 2019-11-22 ENCOUNTER — Other Ambulatory Visit: Payer: Self-pay

## 2019-11-22 DIAGNOSIS — T7840XA Allergy, unspecified, initial encounter: Secondary | ICD-10-CM | POA: Diagnosis not present

## 2019-11-22 MED ORDER — METHYLPREDNISOLONE SODIUM SUCC 125 MG IJ SOLR
INTRAMUSCULAR | Status: AC
  Filled 2019-11-22: qty 2

## 2019-11-22 MED ORDER — METHYLPREDNISOLONE SODIUM SUCC 125 MG IJ SOLR
60.0000 mg | Freq: Once | INTRAMUSCULAR | Status: AC
  Administered 2019-11-22: 60 mg via INTRAMUSCULAR

## 2019-11-22 NOTE — Discharge Instructions (Addendum)
Believe this is most likely allergic reaction based on the swelling in your symptoms. Keep taking the antihistamines Steroid injection given here. Follow-up with your eye doctor as needed and please return here for any worsening symptoms over the next day or so.

## 2019-11-22 NOTE — ED Triage Notes (Signed)
C/o eye irritation x2 days. Reports more itching than pain.

## 2019-11-23 NOTE — ED Provider Notes (Signed)
[place]    CSN: [id number] Arrival date & time: 11/22/19  1623      History   Chief Complaint Chief Complaint  Patient presents with  . Eye Problem    HPI Morgan Torres is a 69 y.o. female.   Patient is a 69 year old female presents today for right eye lid swelling, irritation and itching for the past 2 days.  Symptoms been constant and worsening.  She believes she may have gotten bit by something.  She has a swollen area just above the eyebrow.  No noticeable redness.  The eyes also been slightly watery.  No pain in the eye or trouble with vision.  She has been doing cool compresses to the eye with some relief and decreasing swelling.  Taking Benadryl with some relief.  ROS per HPI      Past Medical History:  Diagnosis Date  . Anxiety   . Arthritis    hip, lumbar spine   . Asthma    seasonal allergies   . Bronchitis    Hx: of  . COPD (chronic obstructive pulmonary disease) ([place])   . Coronary artery disease   . Depression   . Fibromyalgia   . Hypertension   . Lumbar herniated disc   . Myocardial infarction [facility]) 2014   followed by Dr. Debara Pickett, treated medically, no stents  . Pneumonia    Hx: of  . Sciatica   . Small bowel obstruction [facility])     Patient Active Problem List   Diagnosis Date Noted  . Morbid obesity, unspecified obesity type ([place]) 08/28/2016  . Non-restorative sleep 08/28/2016  . Snoring 08/28/2016  . Hip pain 06/03/2014  . Acute blood loss anemia 06/01/2014  . Osteoarthritis of left hip 05/28/2014  . Obesity (BMI 30.0-34.9) 04/20/2013  . Nonischemic cardiomyopathy ([place]) 04/01/2013  . Smoking 04/01/2013  . NSTEMI - Troponin pk 1.5 with ant TWI on EKG 03/29/2013  . Spinal stenosis, lumbar region, with neurogenic claudication 01/23/2013  . VAGINAL TRICHOMONIASIS 06/28/2006  . Hyperlipidemia 06/28/2006  . ANEMIA-NOS 06/28/2006  . ANXIETY 06/28/2006  . DEPRESSION 06/28/2006  . SYNDROME, CARPAL TUNNEL 06/28/2006  . Essential  hypertension 06/28/2006  . ALLERGIC RHINITIS 06/28/2006  . ASTHMA 06/28/2006  . PAROTIDITIS 06/28/2006  . LOW BACK PAIN 06/28/2006  . TB SKIN TEST, POSITIVE, HX OF 06/28/2006    Past Surgical History:  Procedure Laterality Date  . ABDOMINAL HYSTERECTOMY    . ABDOMINAL SURGERY     resection of small intestine  . BACK SURGERY     fusion  . CARDIAC CATHETERIZATION  2014  . COLON SURGERY     resection for bowel - for obstruction   . COLONOSCOPY     Hx; of  . DILATION AND CURETTAGE OF UTERUS    . HERNIA REPAIR  1970's   inguinal hernia   . LEFT HEART CATHETERIZATION WITH CORONARY ANGIOGRAM N/A 03/30/2013   Procedure: LEFT HEART CATHETERIZATION WITH CORONARY ANGIOGRAM;  Surgeon: Leonie Man, MD;  Location: [facility] CATH LAB;  Service: Cardiovascular;  Laterality: N/A;  . SALIVARY GLAND SURGERY Left 1970-1980   approached from inside mouth & side of neck  . TONSILLECTOMY    . TOTAL HIP ARTHROPLASTY Left 05/28/2014   Procedure: LEFT TOTAL HIP ARTHROPLASTY;  Surgeon: Yvette Rack., MD;  Location: [place];  Service: Orthopedics;  Laterality: Left;  . TOTAL HIP ARTHROPLASTY Left 05/28/2014   dr caffrey    OB History   No obstetric history on file.  Home Medications    Prior to Admission medications   Medication Sig Start Date End Date Taking? Authorizing Provider  acetaminophen (TYLENOL) 500 MG tablet Take 2 tablets (1,000 mg total) by mouth every 6 (six) hours as needed for mild pain. 10/22/19   Darr, Marguerita Beards, PA-C  albuterol (PROVENTIL HFA;VENTOLIN HFA) 108 (90 Base) MCG/ACT inhaler Inhale 2 puffs into the lungs every 6 (six) hours as needed for wheezing or shortness of breath. 05/10/16   Katy Apo, NP  amLODipine (NORVASC) 5 MG tablet Take 1 tablet (5 mg total) by mouth daily. Please call and schedule follow up office visit to receive further refills. Thank you. [id number] 11/19/19   Pixie Casino, MD  aspirin 81 MG tablet Take 81 mg by mouth daily.    [provider]  cephALEXin (KEFLEX) 500 MG capsule Take 1 capsule (500 mg total) by mouth 2 (two) times daily. 01/25/19   Jaynee Eagles, PA-C  cetirizine (ZYRTEC) 10 MG tablet Take 1 tablet (10 mg total) by mouth daily. 07/22/19   Jaynee Eagles, PA-C  cyclobenzaprine (FLEXERIL) 10 MG tablet Take 0.5 tablets (5 mg total) by mouth at bedtime. 10/22/19   Darr, Marguerita Beards, PA-C  EPINEPHrine (EPIPEN 2-PAK) 0.3 mg/0.3 mL DEVI Inject 0.3 mLs (0.3 mg total) into the muscle once as needed (for severe allergic reaction). CAll 911 immediately if you have to use this medicine 11/26/12   Piepenbrink, Anderson Malta, PA-C  gabapentin (NEURONTIN) 300 MG capsule Take 300 mg by mouth at bedtime. 08/08/16   [provider]  LORazepam (ATIVAN) 1 MG tablet Take 1 mg by mouth daily as needed for anxiety.    [provider]  LOSARTAN POTASSIUM-HCTZ PO Take by mouth.    [provider]  metoprolol tartrate (LOPRESSOR) 25 MG tablet TAKE 1/2 TAB BY MOUTH 2 TIMES DAILY. PLEASE SCHEDULE YEARLY APPT FOR MORE REFILLS, THANKS! 11/03/19   Pixie Casino, MD  montelukast (SINGULAIR) 10 MG tablet Take 10 mg by mouth at bedtime.    [provider]  nitroGLYCERIN (NITROSTAT) 0.4 MG SL tablet Place 1 tablet (0.4 mg total) under the tongue every 5 (five) minutes x 3 doses as needed for chest pain. 09/25/18   Hilty, Nadean Corwin, MD    Family History Family History  Problem Relation Age of Onset  . Hypertension Mother   . Diabetes Mother   . Cancer - Other Mother   . Cancer Sister     Social History Social History   Tobacco Use  . Smoking status: Current Every Day Smoker    Packs/day: 0.40    Years: 25.00    Pack years: 10.00    Types: Cigarettes  . Smokeless tobacco: Never Used  . Tobacco comment: Currently on the Nicoderm patch."smokes a few"  Substance Use Topics  . Alcohol use: Yes    Comment: occasionally  . Drug use: No     Allergies   Diclofenac, Iodinated diagnostic agents, Codeine, and  Hydrocodone   Review of Systems Review of Systems   Physical Exam Triage Vital Signs ED Triage Vitals  Enc Vitals Group     BP 11/22/19 1706 (!) 128/58     Pulse Rate 11/22/19 1706 62     Resp 11/22/19 1706 16     Temp 11/22/19 1706 99 F (37.2 C)     Temp Source 11/22/19 1706 Oral     SpO2 11/22/19 1706 98 %     Weight --  Height --      Head Circumference --      Peak Flow --      Pain Score 11/22/19 1704 2     Pain Loc --      Pain Edu? --      Excl. in [place]? --    No data found.  Updated Vital Signs BP (!) 128/58 (BP Location: Left Arm)   Pulse 62   Temp 99 F (37.2 C) (Oral)   Resp 16   SpO2 98%   Visual Acuity Right Eye Distance: 20 50 Left Eye Distance: 20 25 Bilateral Distance: 20 25  Right Eye Near:   Left Eye Near:    Bilateral Near:     Physical Exam Vitals and nursing note reviewed.  Constitutional:      General: She is not in acute distress.    Appearance: Normal appearance. She is not ill-appearing, toxic-appearing or diaphoretic.  HENT:     Head: Normocephalic.     Mouth/Throat:     Pharynx: Oropharynx is clear.  Eyes:     Extraocular Movements: Extraocular movements intact.     Conjunctiva/sclera: Conjunctivae normal.     Pupils: Pupils are equal, round, and reactive to light.     Comments: Moderate right upper lid swelling mildly extending into eyelid.  No redness.  No pain with palpation in orbital area or periorbital area. No pain with EOM  Pulmonary:     Effort: Pulmonary effort is normal.  Musculoskeletal:        General: Normal range of motion.     Cervical back: Normal range of motion.  Skin:    General: Skin is warm and dry.     Findings: No rash.  Neurological:     Mental Status: She is alert.  Psychiatric:        Mood and Affect: Mood normal.      UC Treatments / Results  Labs (all labs ordered are listed, but only abnormal results are displayed) Labs Reviewed - No data to display  EKG   Radiology No  results found.  Procedures Procedures (including critical care time)  Medications Ordered in UC Medications  methylPREDNISolone sodium succinate (SOLU-MEDROL) 125 mg/2 mL injection 60 mg (60 mg Intramuscular Given 11/22/19 1732)    Initial Impression / Assessment and Plan / UC Course  I have reviewed the triage vital signs and the nursing notes.  Pertinent labs & imaging results that were available during my care of the patient were reviewed by me and considered in my medical decision making (see chart for details).     Allergic reaction to possible bug bite. Moderate swelling to right upper lid and to the eyebrow area.  No erythema, periorbital pain. No pain with EOM.  Not concerned for cellulitis at this time.  We will go ahead and treat with Solu-Medrol injection and have her continue the antihistamines.  Cool compresses to the eye. Follow up as needed for continued or worsening symptoms  Final Clinical Impressions(s) / UC Diagnoses   Final diagnoses:  Allergic reaction, initial encounter     Discharge Instructions     Believe this is most likely allergic reaction based on the swelling in your symptoms. Keep taking the antihistamines Steroid injection given here. Follow-up with your eye doctor as needed and please return here for any worsening symptoms over the next day or so.    ED Prescriptions    None     PDMP not reviewed this encounter.  Loura Halt A, NP 11/23/19 1125

## 2019-11-27 ENCOUNTER — Telehealth: Payer: Self-pay

## 2019-11-27 DIAGNOSIS — G4733 Obstructive sleep apnea (adult) (pediatric): Secondary | ICD-10-CM | POA: Diagnosis not present

## 2019-11-27 NOTE — Telephone Encounter (Signed)
   Primary Cardiologist:Kenneth C Hilty, MD  Chart reviewed as part of pre-operative protocol coverage. Because of Morgan Torres's past medical history and time since last visit, they will require a follow-up visit in order to better assess preoperative cardiovascular risk.  Pre-op covering staff: - Please schedule appointment and call patient to inform them. If patient already had an upcoming appointment within acceptable timeframe, please add "pre-op clearance" to the appointment notes so provider is aware. - Please contact requesting surgeon's office via preferred method (i.e, phone, fax) to inform them of need for appointment prior to surgery.  If applicable, this message will also be routed to pharmacy pool and/or primary cardiologist for input on holding anticoagulant/antiplatelet agent as requested below so that this information is available to the clearing provider at time of patient's appointment.   [place], [place]  11/27/2019, 5:47 PM

## 2019-11-27 NOTE — Telephone Encounter (Signed)
   [facility] Pre-operative Risk Assessment    HEARTCARE STAFF: - Please ensure there is not already an duplicate clearance open for this procedure. - Under Visit Info/Reason for Call, type in Other and utilize the format Clearance MM/DD/YY or Clearance TBD. Do not use dashes or single digits. - If request is for dental extraction, please clarify the # of teeth to be extracted.  Request for surgical clearance:  1. What type of surgery is being performed? c7-T1 IL ESI  2. When is this surgery scheduled? TBD  3. What type of clearance is required (medical clearance vs. Pharmacy clearance to hold med vs. Both)? both  4. Are there any medications that need to be held prior to surgery and how long? Aspirin - 7 days   5. Practice name and name of physician performing surgery? Laroy Apple, MD / [place]   6. What is the office phone number? [phone]   7.   What is the office fax number? [phone] Att: X-RAY  8.   Anesthesia type (None, local, MAC, general) ? unknown   Sherrie Mustache 11/27/2019, 4:31 PM  _________________________________________________________________   (provider comments below)

## 2019-11-28 DIAGNOSIS — H01111 Allergic dermatitis of right upper eyelid: Secondary | ICD-10-CM | POA: Diagnosis not present

## 2019-12-01 NOTE — Telephone Encounter (Signed)
Attempted to contact patient to schedule an appointment for pre-op clearance, but there was no answer and VM was not working.

## 2019-12-02 DIAGNOSIS — M542 Cervicalgia: Secondary | ICD-10-CM | POA: Diagnosis not present

## 2019-12-02 DIAGNOSIS — M5412 Radiculopathy, cervical region: Secondary | ICD-10-CM | POA: Diagnosis not present

## 2019-12-02 NOTE — Telephone Encounter (Signed)
Pt has been scheduled to see Jory Sims, DNP 12/23/19 @ 9:45 for pre op clearance. I will forward clearance notes to DNP for upcoming appt. I will send FYI to surgeon, pt has appt with cardiologist 12/23/19 for pre assessment. I will remove from the pre op call back pool.

## 2019-12-02 NOTE — Telephone Encounter (Signed)
Tried to reach pt to schedule appt for pre op clearance. VM is fill could not leave message.

## 2019-12-04 ENCOUNTER — Other Ambulatory Visit: Payer: Self-pay | Admitting: Internal Medicine

## 2019-12-07 ENCOUNTER — Other Ambulatory Visit: Payer: Self-pay | Admitting: Internal Medicine

## 2019-12-22 ENCOUNTER — Other Ambulatory Visit: Payer: Self-pay | Admitting: Internal Medicine

## 2019-12-23 ENCOUNTER — Encounter: Payer: Self-pay | Admitting: Adult Health

## 2019-12-23 ENCOUNTER — Other Ambulatory Visit: Payer: Self-pay

## 2019-12-23 ENCOUNTER — Ambulatory Visit: Payer: Medicare Other | Admitting: Adult Health

## 2019-12-23 VITALS — BP 152/78 | HR 82 | Ht 68.0 in | Wt 244.6 lb

## 2019-12-23 DIAGNOSIS — I10 Essential (primary) hypertension: Secondary | ICD-10-CM | POA: Diagnosis not present

## 2019-12-23 DIAGNOSIS — Z01810 Encounter for preprocedural cardiovascular examination: Secondary | ICD-10-CM

## 2019-12-23 DIAGNOSIS — G4733 Obstructive sleep apnea (adult) (pediatric): Secondary | ICD-10-CM | POA: Diagnosis not present

## 2019-12-23 DIAGNOSIS — I2510 Atherosclerotic heart disease of native coronary artery without angina pectoris: Secondary | ICD-10-CM | POA: Diagnosis not present

## 2019-12-23 DIAGNOSIS — J45998 Other asthma: Secondary | ICD-10-CM | POA: Diagnosis not present

## 2019-12-23 DIAGNOSIS — J449 Chronic obstructive pulmonary disease, unspecified: Secondary | ICD-10-CM | POA: Diagnosis not present

## 2019-12-23 DIAGNOSIS — Z720 Tobacco use: Secondary | ICD-10-CM | POA: Diagnosis not present

## 2019-12-23 NOTE — Progress Notes (Signed)
Cardiology Office Note   Date:  12/23/2019   ID:  Morgan, Torres 02/20/1951, MRN [id number]  PCP:  [name], MD  Cardiologist: Dr. Debara Pickett CC: Preoperative evaluation   History of Present Illness: Morgan Torres is a 69 y.o. female who presents for ongoing assessment and management of coronary artery disease, history of NSTEMI in 2014.  Cardiac cath showed mild to moderate nonobstructive coronary artery disease which was out of proportion to her degree of cardiomyopathy.  It was felt that this was related to Takotsubo cardiomyopathy based on LV gram, follow-up echocardiogram revealed EF of 55% with no wall motion abnormalities.  The patient also has a history of hypertension, hyperlipidemia and obstructive sleep apnea followed by Dr. Claiborne Billings.  Unfortunately the patient continues to smoke.  Was last seen by Dr. Debara Pickett on 09/25/2018, follow-up lipid study was ordered.  She was to continue using CPAP and being followed by sleep medicine.  She comes today with right arm pain.  She has been seen by [facility] which found a cervical disc pinched nerve and she requires epidural injection.  She is here for preoperative evaluation and recommendations concerning holding aspirin.  Past Medical History:  Diagnosis Date  . Anxiety   . Arthritis    hip, lumbar spine   . Asthma    seasonal allergies   . Bronchitis    Hx: of  . COPD (chronic obstructive pulmonary disease) ([place])   . Coronary artery disease   . Depression   . Fibromyalgia   . Hypertension   . Lumbar herniated disc   . Myocardial infarction [facility]) 2014   followed by Dr. Debara Pickett, treated medically, no stents  . Pneumonia    Hx: of  . Sciatica   . Small bowel obstruction [facility])     Past Surgical History:  Procedure Laterality Date  . ABDOMINAL HYSTERECTOMY    . ABDOMINAL SURGERY     resection of small intestine  . BACK SURGERY     fusion  . CARDIAC CATHETERIZATION  2014  . COLON SURGERY     resection for bowel - for  obstruction   . COLONOSCOPY     Hx; of  . DILATION AND CURETTAGE OF UTERUS    . HERNIA REPAIR  1970's   inguinal hernia   . LEFT HEART CATHETERIZATION WITH CORONARY ANGIOGRAM N/A 03/30/2013   Procedure: LEFT HEART CATHETERIZATION WITH CORONARY ANGIOGRAM;  Surgeon: Leonie Man, MD;  Location: [facility] CATH LAB;  Service: Cardiovascular;  Laterality: N/A;  . SALIVARY GLAND SURGERY Left 1970-1980   approached from inside mouth & side of neck  . TONSILLECTOMY    . TOTAL HIP ARTHROPLASTY Left 05/28/2014   Procedure: LEFT TOTAL HIP ARTHROPLASTY;  Surgeon: Yvette Rack., MD;  Location: [facility];  Service: Orthopedics;  Laterality: Left;  . TOTAL HIP ARTHROPLASTY Left 05/28/2014   dr caffrey     Current Outpatient Medications  Medication Sig Dispense Refill  . acetaminophen (TYLENOL) 500 MG tablet Take 2 tablets (1,000 mg total) by mouth every 6 (six) hours as needed for mild pain. 30 tablet 0  . albuterol (PROVENTIL HFA;VENTOLIN HFA) 108 (90 Base) MCG/ACT inhaler Inhale 2 puffs into the lungs every 6 (six) hours as needed for wheezing or shortness of breath. 1 Inhaler 0  . amLODipine (NORVASC) 5 MG tablet Take 1 tablet (5 mg total) by mouth daily. 90 tablet 0  . aspirin 81 MG tablet Take 81 mg by mouth daily.    Marland Kitchen  cephALEXin (KEFLEX) 500 MG capsule Take 1 capsule (500 mg total) by mouth 2 (two) times daily. 10 capsule 0  . cetirizine (ZYRTEC) 10 MG tablet Take 1 tablet (10 mg total) by mouth daily. 90 tablet 0  . cyclobenzaprine (FLEXERIL) 10 MG tablet Take 0.5 tablets (5 mg total) by mouth at bedtime. 20 tablet 0  . EPINEPHrine (EPIPEN 2-PAK) 0.3 mg/0.3 mL DEVI Inject 0.3 mLs (0.3 mg total) into the muscle once as needed (for severe allergic reaction). CAll 911 immediately if you have to use this medicine 1 Device 1  . gabapentin (NEURONTIN) 300 MG capsule Take 300 mg by mouth at bedtime.  2  . LORazepam (ATIVAN) 1 MG tablet Take 1 mg by mouth daily as needed for anxiety.    Marland Kitchen LOSARTAN  POTASSIUM-HCTZ PO Take by mouth.    . metoprolol tartrate (LOPRESSOR) 25 MG tablet TAKE 1/2 TAB BY MOUTH 2 TIMES DAILY. PLEASE SCHEDULE YEARLY APPT FOR MORE REFILLS, THANKS! 30 tablet 2  . montelukast (SINGULAIR) 10 MG tablet Take 10 mg by mouth at bedtime.    . nitroGLYCERIN (NITROSTAT) 0.4 MG SL tablet Place 1 tablet (0.4 mg total) under the tongue every 5 (five) minutes x 3 doses as needed for chest pain. 25 tablet 2   No current facility-administered medications for this visit.    Allergies:   Diclofenac, Iodinated diagnostic agents, Codeine, and Hydrocodone    Social History:  The patient  reports that she has been smoking cigarettes. She has a 10.00 pack-year smoking history. She has never used smokeless tobacco. She reports current alcohol use. She reports that she does not use drugs.   Family History:  The patient's family history includes Cancer in her sister; Cancer - Other in her mother; Diabetes in her mother; Hypertension in her mother.    ROS: All other systems are reviewed and negative. Unless otherwise mentioned in H&P    PHYSICAL EXAM: VS:  BP (!) 152/78   Pulse 82   Ht 5\' 8"  (1.727 m)   Wt 244 lb 9.6 oz (110.9 kg)   SpO2 95%   BMI 37.19 kg/m  , BMI Body mass index is 37.19 kg/m. GEN: Well nourished, well developed, in no acute distress HEENT: normal Neck: no JVD, carotid bruits, or masses Cardiac: RRR; no murmurs, rubs, or gallops,no edema  Respiratory:  Clear to auscultation bilaterally, normal work of breathing GI: soft, nontender, nondistended, + BS MS: no deformity or atrophy, pain with movement of her right arm and shoulder Skin: warm and dry, no rash Neuro:  Strength and sensation are intact Psych: euthymic mood, full affect   EKG:   Recent Labs: No results found for requested labs within last 8760 hours.    Lipid Panel    Component Value Date/Time   CHOL 231 (H) 03/30/2013 0929   TRIG 105 03/30/2013 0929   HDL 51 03/30/2013 0929   CHOLHDL  4.5 03/30/2013 0929   VLDL 21 03/30/2013 0929   LDLCALC 159 (H) 03/30/2013 0929      Wt Readings from Last 3 Encounters:  12/23/19 244 lb 9.6 oz (110.9 kg)  10/22/19 235 lb (106.6 kg)  07/22/19 250 lb (113.4 kg)      Other studies Reviewed: Echocardiogram 05/26/14 Left ventricle: The cavity size was normal. Systolic function was  normal. The estimated ejection fraction was in the range of 55%  to 60%. Wall motion was normal; there were no regional wall  motion abnormalities. There was an increased relative  contribution of atrial contraction to ventricular filling.     ASSESSMENT AND PLAN:  1. Pre-Operative Cardiac Evaluation:    Chart reviewed as part of pre-operative protocol coverage. Given past medical history and time since last visit, based on ACC/AHA guidelines, Morgan Torres would be at acceptable risk for the planned procedure without further cardiovascular testing.  Okay to hold aspirin prior to injection, but will need to restart as soon as possible thereafter  2.  Hypertension: Blood pressure is elevated today.  She is in a good bit of pain from her shoulder.  She has not taken any pain medication today so that she can drive to our office.  We will continue to monitor this after she has pain relief.  If continued elevation may need to consider adjustment in her medications.  For now continue amlodipine 5 mg daily, losartan HCTZ daily, and metoprolol 12.5 mg twice daily.   3.  CAD: Cardiac catheterization 2014 revealed nonobstructive coronary artery disease.  She offers no complaints of chest pain or dyspnea on exertion.  Main complaint is constant right shoulder pain and right arm pain with tingling.  Do not plan cardiac testing at this time unless new symptoms arise.  4.  OSA: Remains on CPAP and followed by Dr. Claiborne Billings.  She remains compliant.  Current medicines are reviewed at length with the patient today.  I have spent 25 minutes dedicated to the care of  this patient on the date of this encounter to include pre-visit review of records, assessment, management and diagnostic testing,with shared decision making.  Labs/ tests ordered today include: None   Phill Myron. West Pugh, ANP, [facility]   12/23/2019 10:49 AM    [facility] [place] [address] Office [phone] Fax [phone]  Notice: This dictation was prepared with Dragon dictation along with smaller phrase technology. Any transcriptional errors that result from this process are unintentional and may not be corrected upon review.

## 2019-12-23 NOTE — Patient Instructions (Signed)
Medication Instructions:  Continue current medications  *If you need a refill on your cardiac medications before your next appointment, please call your pharmacy*   Lab Work: None Ordered   Testing/Procedures: None ordered   Follow-Up: At [facility], you and your health needs are our priority.  As part of our continuing mission to provide you with exceptional heart care, we have created designated Provider Care Teams.  These Care Teams include your primary Cardiologist (physician) and Advanced Practice Providers (APPs -  Physician Assistants and Nurse Practitioners) who all work together to provide you with the care you need, when you need it.  We recommend signing up for the patient portal called "MyChart".  Sign up information is provided on this After Visit Summary.  MyChart is used to connect with patients for Virtual Visits (Telemedicine).  Patients are able to view lab/test results, encounter notes, upcoming appointments, etc.  Non-urgent messages can be sent to your provider as well.   To learn more about what you can do with MyChart, go to [email or web address].    Your next appointment:   6 month(s)  The format for your next appointment:   In Person  Provider:   You may see Pixie Casino, MD or one of the following Advanced Practice Providers on your designated Care Team:    Almyra Deforest, PA-C  Fabian Sharp, PA-C or   Roby Lofts, [place]

## 2019-12-24 NOTE — Telephone Encounter (Signed)
RECEIVED DUPLICATE CLEARANCE FAX.  Faxed request along with Jory Sims OV note dated 12-23-19 to requesting providers via Epic Fax function.

## 2019-12-24 NOTE — Telephone Encounter (Signed)
Alyssa from Dr. Sharol Roussel office is calling because she cannot find the clearance that was just faxed over. Please re-fax.

## 2019-12-31 DIAGNOSIS — M5412 Radiculopathy, cervical region: Secondary | ICD-10-CM | POA: Diagnosis not present

## 2019-12-31 DIAGNOSIS — M542 Cervicalgia: Secondary | ICD-10-CM | POA: Diagnosis not present

## 2020-01-04 DIAGNOSIS — M542 Cervicalgia: Secondary | ICD-10-CM | POA: Diagnosis not present

## 2020-01-11 DIAGNOSIS — R3915 Urgency of urination: Secondary | ICD-10-CM | POA: Diagnosis not present

## 2020-01-11 DIAGNOSIS — M542 Cervicalgia: Secondary | ICD-10-CM | POA: Diagnosis not present

## 2020-01-12 DIAGNOSIS — Z1231 Encounter for screening mammogram for malignant neoplasm of breast: Secondary | ICD-10-CM | POA: Diagnosis not present

## 2020-01-15 DIAGNOSIS — M542 Cervicalgia: Secondary | ICD-10-CM | POA: Diagnosis not present

## 2020-01-15 DIAGNOSIS — M5412 Radiculopathy, cervical region: Secondary | ICD-10-CM | POA: Diagnosis not present

## 2020-02-02 DIAGNOSIS — Z720 Tobacco use: Secondary | ICD-10-CM | POA: Diagnosis not present

## 2020-02-02 DIAGNOSIS — M542 Cervicalgia: Secondary | ICD-10-CM | POA: Diagnosis not present

## 2020-02-25 DIAGNOSIS — G4733 Obstructive sleep apnea (adult) (pediatric): Secondary | ICD-10-CM | POA: Diagnosis not present

## 2020-03-08 DIAGNOSIS — R0981 Nasal congestion: Secondary | ICD-10-CM | POA: Diagnosis not present

## 2020-03-08 DIAGNOSIS — E785 Hyperlipidemia, unspecified: Secondary | ICD-10-CM | POA: Diagnosis not present

## 2020-03-08 DIAGNOSIS — R0982 Postnasal drip: Secondary | ICD-10-CM | POA: Diagnosis not present

## 2020-03-09 DIAGNOSIS — E785 Hyperlipidemia, unspecified: Secondary | ICD-10-CM | POA: Diagnosis not present

## 2020-04-05 DIAGNOSIS — R519 Headache, unspecified: Secondary | ICD-10-CM | POA: Diagnosis not present

## 2020-04-05 DIAGNOSIS — H9312 Tinnitus, left ear: Secondary | ICD-10-CM | POA: Diagnosis not present

## 2020-04-05 DIAGNOSIS — R0981 Nasal congestion: Secondary | ICD-10-CM | POA: Diagnosis not present

## 2020-04-15 DIAGNOSIS — J310 Chronic rhinitis: Secondary | ICD-10-CM | POA: Diagnosis not present

## 2020-04-15 DIAGNOSIS — M26609 Unspecified temporomandibular joint disorder, unspecified side: Secondary | ICD-10-CM | POA: Diagnosis not present

## 2020-04-16 ENCOUNTER — Other Ambulatory Visit: Payer: Self-pay | Admitting: Cardiovascular Disease

## 2020-04-16 DIAGNOSIS — I10 Essential (primary) hypertension: Secondary | ICD-10-CM | POA: Diagnosis not present

## 2020-04-16 DIAGNOSIS — M5440 Lumbago with sciatica, unspecified side: Secondary | ICD-10-CM | POA: Diagnosis not present

## 2020-04-16 DIAGNOSIS — Z23 Encounter for immunization: Secondary | ICD-10-CM | POA: Diagnosis not present

## 2020-04-22 DIAGNOSIS — M170 Bilateral primary osteoarthritis of knee: Secondary | ICD-10-CM | POA: Diagnosis not present

## 2020-04-23 DIAGNOSIS — Z720 Tobacco use: Secondary | ICD-10-CM | POA: Diagnosis not present

## 2020-04-23 DIAGNOSIS — J449 Chronic obstructive pulmonary disease, unspecified: Secondary | ICD-10-CM | POA: Diagnosis not present

## 2020-04-23 DIAGNOSIS — J45998 Other asthma: Secondary | ICD-10-CM | POA: Diagnosis not present

## 2020-04-23 DIAGNOSIS — I10 Essential (primary) hypertension: Secondary | ICD-10-CM | POA: Diagnosis not present

## 2020-04-29 DIAGNOSIS — Z23 Encounter for immunization: Secondary | ICD-10-CM | POA: Diagnosis not present

## 2020-05-03 DIAGNOSIS — M1712 Unilateral primary osteoarthritis, left knee: Secondary | ICD-10-CM | POA: Diagnosis not present

## 2020-05-07 DIAGNOSIS — M25562 Pain in left knee: Secondary | ICD-10-CM | POA: Diagnosis not present

## 2020-05-09 DIAGNOSIS — F17210 Nicotine dependence, cigarettes, uncomplicated: Secondary | ICD-10-CM | POA: Diagnosis not present

## 2020-05-09 DIAGNOSIS — M545 Low back pain, unspecified: Secondary | ICD-10-CM | POA: Diagnosis not present

## 2020-05-09 DIAGNOSIS — M170 Bilateral primary osteoarthritis of knee: Secondary | ICD-10-CM | POA: Diagnosis not present

## 2020-05-09 DIAGNOSIS — M25462 Effusion, left knee: Secondary | ICD-10-CM | POA: Diagnosis not present

## 2020-05-09 DIAGNOSIS — Z7189 Other specified counseling: Secondary | ICD-10-CM | POA: Diagnosis not present

## 2020-05-10 DIAGNOSIS — M1711 Unilateral primary osteoarthritis, right knee: Secondary | ICD-10-CM | POA: Diagnosis not present

## 2020-05-17 DIAGNOSIS — M1711 Unilateral primary osteoarthritis, right knee: Secondary | ICD-10-CM | POA: Diagnosis not present

## 2020-05-24 ENCOUNTER — Telehealth: Payer: Self-pay | Admitting: Internal Medicine

## 2020-05-24 DIAGNOSIS — J45998 Other asthma: Secondary | ICD-10-CM | POA: Diagnosis not present

## 2020-05-24 DIAGNOSIS — I10 Essential (primary) hypertension: Secondary | ICD-10-CM | POA: Diagnosis not present

## 2020-05-24 DIAGNOSIS — J449 Chronic obstructive pulmonary disease, unspecified: Secondary | ICD-10-CM | POA: Diagnosis not present

## 2020-05-24 DIAGNOSIS — M1711 Unilateral primary osteoarthritis, right knee: Secondary | ICD-10-CM | POA: Diagnosis not present

## 2020-05-24 DIAGNOSIS — Z720 Tobacco use: Secondary | ICD-10-CM | POA: Diagnosis not present

## 2020-05-24 NOTE — Telephone Encounter (Signed)
   [facility] Pre-operative Risk Assessment   Request for surgical clearance:  1. What type of surgery is being performed? Left knee scope   2. When is this surgery scheduled? TBD   3. What type of clearance is required (medical clearance vs. Pharmacy clearance to hold med vs. Both)? Both   4. Are there any medications that need to be held prior to surgery and how long? ASA    5. Practice name and name of physician performing surgery? Earlie Server MD - [facility]   6. What is the office phone number? [phone]   7.   What is the office fax number? [phone] Attn: Claiborne Billings  8.   Anesthesia type (None, local, MAC, general) ? Not specified    Morgan Torres 05/24/2020, 4:23 PM  _________________________________________________________________   (provider comments below)

## 2020-05-25 NOTE — Telephone Encounter (Signed)
   Primary Cardiologist: Pixie Casino, MD  Chart reviewed as part of pre-operative protocol coverage. Patient was contacted 05/25/2020 in reference to pre-operative risk assessment for pending surgery as outlined below.  JAYLEENA STILLE was last seen on 12/23/19 by Jory Sims NP.  Since that day, SAYDI KOBEL has done well. She is limited by knee and back pain, but can still complete 4.0 METS without angina. Heart cath in 2014 with nonobstructive disease. We prefer to continue ASA throughout the perioperative period; however, if doing so increases morbidity or mortality, may hold 5-7 days prior to surgery.  Therefore, based on ACC/AHA guidelines, the patient would be at acceptable risk for the planned procedure without further cardiovascular testing.   The patient was advised that if she develops new symptoms prior to surgery to contact our office to arrange for a follow-up visit, and she verbalized understanding.  I will route this recommendation to the requesting party via Epic fax function and remove from pre-op pool. Please call with questions.  Tami Lin Marvis Bakken, PA 05/25/2020, 10:36 AM

## 2020-05-27 ENCOUNTER — Ambulatory Visit: Payer: Medicare Other | Attending: Internal Medicine

## 2020-05-27 DIAGNOSIS — Z23 Encounter for immunization: Secondary | ICD-10-CM

## 2020-05-27 NOTE — Progress Notes (Signed)
   Covid-19 Vaccination Clinic  Name:  Morgan Torres    MRN: [id number] DOB: 03/12/1951  05/27/2020  [name] was observed post Covid-19 immunization for 15 minutes without incident. She was provided with Vaccine Information Sheet and instruction to access the V-Safe system.   [name] was instructed to call 911 with any severe reactions post vaccine: Marland Kitchen Difficulty breathing  . Swelling of face and throat  . A fast heartbeat  . A bad rash all over body  . Dizziness and weakness   Immunizations Administered    Name Date Dose VIS Date Route   Pfizer COVID-19 Vaccine 05/27/2020  5:03 PM 0.3 mL 04/13/2020 Intramuscular   Manufacturer: [place]   Lot: [id number]   NDC: [address]-8

## 2020-05-31 DIAGNOSIS — M545 Low back pain, unspecified: Secondary | ICD-10-CM | POA: Diagnosis not present

## 2020-05-31 DIAGNOSIS — G4733 Obstructive sleep apnea (adult) (pediatric): Secondary | ICD-10-CM | POA: Diagnosis not present

## 2020-06-02 DIAGNOSIS — M7061 Trochanteric bursitis, right hip: Secondary | ICD-10-CM | POA: Diagnosis not present

## 2020-06-06 DIAGNOSIS — M7061 Trochanteric bursitis, right hip: Secondary | ICD-10-CM | POA: Diagnosis not present

## 2020-06-15 DIAGNOSIS — S83232A Complex tear of medial meniscus, current injury, left knee, initial encounter: Secondary | ICD-10-CM | POA: Diagnosis not present

## 2020-06-15 DIAGNOSIS — S83282A Other tear of lateral meniscus, current injury, left knee, initial encounter: Secondary | ICD-10-CM | POA: Diagnosis not present

## 2020-06-15 DIAGNOSIS — S83272A Complex tear of lateral meniscus, current injury, left knee, initial encounter: Secondary | ICD-10-CM | POA: Diagnosis not present

## 2020-06-15 DIAGNOSIS — G8918 Other acute postprocedural pain: Secondary | ICD-10-CM | POA: Diagnosis not present

## 2020-06-15 DIAGNOSIS — S83242A Other tear of medial meniscus, current injury, left knee, initial encounter: Secondary | ICD-10-CM | POA: Diagnosis not present

## 2020-06-15 DIAGNOSIS — M1712 Unilateral primary osteoarthritis, left knee: Secondary | ICD-10-CM | POA: Diagnosis not present

## 2020-06-15 DIAGNOSIS — M948X6 Other specified disorders of cartilage, lower leg: Secondary | ICD-10-CM | POA: Diagnosis not present

## 2020-06-24 DIAGNOSIS — I10 Essential (primary) hypertension: Secondary | ICD-10-CM | POA: Diagnosis not present

## 2020-06-24 DIAGNOSIS — J449 Chronic obstructive pulmonary disease, unspecified: Secondary | ICD-10-CM | POA: Diagnosis not present

## 2020-06-24 DIAGNOSIS — Z720 Tobacco use: Secondary | ICD-10-CM | POA: Diagnosis not present

## 2020-06-24 DIAGNOSIS — J45998 Other asthma: Secondary | ICD-10-CM | POA: Diagnosis not present

## 2020-06-29 DIAGNOSIS — M6281 Muscle weakness (generalized): Secondary | ICD-10-CM | POA: Diagnosis not present

## 2020-06-29 DIAGNOSIS — R269 Unspecified abnormalities of gait and mobility: Secondary | ICD-10-CM | POA: Diagnosis not present

## 2020-06-29 DIAGNOSIS — M1712 Unilateral primary osteoarthritis, left knee: Secondary | ICD-10-CM | POA: Diagnosis not present

## 2020-06-29 DIAGNOSIS — M25662 Stiffness of left knee, not elsewhere classified: Secondary | ICD-10-CM | POA: Diagnosis not present

## 2020-07-06 DIAGNOSIS — M6281 Muscle weakness (generalized): Secondary | ICD-10-CM | POA: Diagnosis not present

## 2020-07-06 DIAGNOSIS — R269 Unspecified abnormalities of gait and mobility: Secondary | ICD-10-CM | POA: Diagnosis not present

## 2020-07-06 DIAGNOSIS — M1712 Unilateral primary osteoarthritis, left knee: Secondary | ICD-10-CM | POA: Diagnosis not present

## 2020-07-06 DIAGNOSIS — M25662 Stiffness of left knee, not elsewhere classified: Secondary | ICD-10-CM | POA: Diagnosis not present

## 2020-07-08 ENCOUNTER — Other Ambulatory Visit: Payer: Self-pay | Admitting: Internal Medicine

## 2020-08-01 DIAGNOSIS — M25462 Effusion, left knee: Secondary | ICD-10-CM | POA: Diagnosis not present

## 2020-08-01 DIAGNOSIS — M25562 Pain in left knee: Secondary | ICD-10-CM | POA: Diagnosis not present

## 2020-08-01 DIAGNOSIS — M1712 Unilateral primary osteoarthritis, left knee: Secondary | ICD-10-CM | POA: Diagnosis not present

## 2020-08-18 DIAGNOSIS — M1712 Unilateral primary osteoarthritis, left knee: Secondary | ICD-10-CM | POA: Diagnosis not present

## 2020-08-22 ENCOUNTER — Ambulatory Visit: Payer: Medicare Other | Admitting: Internal Medicine

## 2020-08-22 DIAGNOSIS — I10 Essential (primary) hypertension: Secondary | ICD-10-CM | POA: Diagnosis not present

## 2020-08-22 DIAGNOSIS — Z720 Tobacco use: Secondary | ICD-10-CM | POA: Diagnosis not present

## 2020-08-22 DIAGNOSIS — J45998 Other asthma: Secondary | ICD-10-CM | POA: Diagnosis not present

## 2020-08-22 DIAGNOSIS — J449 Chronic obstructive pulmonary disease, unspecified: Secondary | ICD-10-CM | POA: Diagnosis not present

## 2020-08-23 DIAGNOSIS — M1712 Unilateral primary osteoarthritis, left knee: Secondary | ICD-10-CM | POA: Diagnosis not present

## 2020-08-25 ENCOUNTER — Other Ambulatory Visit: Payer: Self-pay | Admitting: Internal Medicine

## 2020-08-29 DIAGNOSIS — I10 Essential (primary) hypertension: Secondary | ICD-10-CM | POA: Diagnosis not present

## 2020-08-29 DIAGNOSIS — J449 Chronic obstructive pulmonary disease, unspecified: Secondary | ICD-10-CM | POA: Diagnosis not present

## 2020-08-29 DIAGNOSIS — E785 Hyperlipidemia, unspecified: Secondary | ICD-10-CM | POA: Diagnosis not present

## 2020-08-29 DIAGNOSIS — Z7189 Other specified counseling: Secondary | ICD-10-CM | POA: Diagnosis not present

## 2020-08-29 DIAGNOSIS — Z720 Tobacco use: Secondary | ICD-10-CM | POA: Diagnosis not present

## 2020-08-29 DIAGNOSIS — M25562 Pain in left knee: Secondary | ICD-10-CM | POA: Diagnosis not present

## 2020-08-30 DIAGNOSIS — M1712 Unilateral primary osteoarthritis, left knee: Secondary | ICD-10-CM | POA: Diagnosis not present

## 2020-09-01 DIAGNOSIS — G4733 Obstructive sleep apnea (adult) (pediatric): Secondary | ICD-10-CM | POA: Diagnosis not present

## 2020-09-06 DIAGNOSIS — M1712 Unilateral primary osteoarthritis, left knee: Secondary | ICD-10-CM | POA: Diagnosis not present

## 2020-10-22 DIAGNOSIS — I10 Essential (primary) hypertension: Secondary | ICD-10-CM | POA: Diagnosis not present

## 2020-10-22 DIAGNOSIS — Z720 Tobacco use: Secondary | ICD-10-CM | POA: Diagnosis not present

## 2020-10-22 DIAGNOSIS — J45998 Other asthma: Secondary | ICD-10-CM | POA: Diagnosis not present

## 2020-10-22 DIAGNOSIS — J449 Chronic obstructive pulmonary disease, unspecified: Secondary | ICD-10-CM | POA: Diagnosis not present

## 2020-11-03 DIAGNOSIS — M170 Bilateral primary osteoarthritis of knee: Secondary | ICD-10-CM | POA: Diagnosis not present

## 2020-11-22 DIAGNOSIS — J45998 Other asthma: Secondary | ICD-10-CM | POA: Diagnosis not present

## 2020-11-22 DIAGNOSIS — J449 Chronic obstructive pulmonary disease, unspecified: Secondary | ICD-10-CM | POA: Diagnosis not present

## 2020-11-22 DIAGNOSIS — I10 Essential (primary) hypertension: Secondary | ICD-10-CM | POA: Diagnosis not present

## 2020-11-22 DIAGNOSIS — Z720 Tobacco use: Secondary | ICD-10-CM | POA: Diagnosis not present

## 2020-11-28 DIAGNOSIS — I10 Essential (primary) hypertension: Secondary | ICD-10-CM | POA: Diagnosis not present

## 2020-11-28 DIAGNOSIS — R3121 Asymptomatic microscopic hematuria: Secondary | ICD-10-CM | POA: Diagnosis not present

## 2020-11-28 DIAGNOSIS — R5383 Other fatigue: Secondary | ICD-10-CM | POA: Diagnosis not present

## 2020-11-28 DIAGNOSIS — E785 Hyperlipidemia, unspecified: Secondary | ICD-10-CM | POA: Diagnosis not present

## 2020-11-29 ENCOUNTER — Other Ambulatory Visit: Payer: Self-pay | Admitting: Internal Medicine

## 2020-11-29 DIAGNOSIS — R310 Gross hematuria: Secondary | ICD-10-CM | POA: Diagnosis not present

## 2020-12-01 DIAGNOSIS — G4733 Obstructive sleep apnea (adult) (pediatric): Secondary | ICD-10-CM | POA: Diagnosis not present

## 2020-12-05 DIAGNOSIS — R310 Gross hematuria: Secondary | ICD-10-CM | POA: Diagnosis not present

## 2020-12-06 ENCOUNTER — Other Ambulatory Visit: Payer: Self-pay | Admitting: Family Medicine

## 2020-12-06 DIAGNOSIS — R310 Gross hematuria: Secondary | ICD-10-CM

## 2020-12-13 ENCOUNTER — Ambulatory Visit
Admission: RE | Admit: 2020-12-13 | Discharge: 2020-12-13 | Disposition: A | Discharge status: Home / Self Care | Payer: Medicare Other | Source: Ambulatory Visit | Attending: Family Medicine | Admitting: Family Medicine

## 2020-12-13 DIAGNOSIS — R944 Abnormal results of kidney function studies: Secondary | ICD-10-CM | POA: Diagnosis not present

## 2020-12-13 DIAGNOSIS — R310 Gross hematuria: Secondary | ICD-10-CM

## 2020-12-13 DIAGNOSIS — N1330 Unspecified hydronephrosis: Secondary | ICD-10-CM | POA: Diagnosis not present

## 2020-12-19 DIAGNOSIS — Z91041 Radiographic dye allergy status: Secondary | ICD-10-CM | POA: Diagnosis not present

## 2020-12-19 DIAGNOSIS — F17200 Nicotine dependence, unspecified, uncomplicated: Secondary | ICD-10-CM | POA: Diagnosis not present

## 2020-12-19 DIAGNOSIS — Z87448 Personal history of other diseases of urinary system: Secondary | ICD-10-CM | POA: Diagnosis not present

## 2020-12-19 DIAGNOSIS — N2889 Other specified disorders of kidney and ureter: Secondary | ICD-10-CM | POA: Diagnosis not present

## 2020-12-19 DIAGNOSIS — N1330 Unspecified hydronephrosis: Secondary | ICD-10-CM | POA: Diagnosis not present

## 2020-12-19 DIAGNOSIS — Z09 Encounter for follow-up examination after completed treatment for conditions other than malignant neoplasm: Secondary | ICD-10-CM | POA: Diagnosis not present

## 2020-12-19 DIAGNOSIS — N390 Urinary tract infection, site not specified: Secondary | ICD-10-CM | POA: Diagnosis not present

## 2020-12-19 DIAGNOSIS — R310 Gross hematuria: Secondary | ICD-10-CM | POA: Diagnosis not present

## 2020-12-22 ENCOUNTER — Other Ambulatory Visit: Payer: Self-pay | Admitting: Internal Medicine

## 2020-12-22 DIAGNOSIS — J449 Chronic obstructive pulmonary disease, unspecified: Secondary | ICD-10-CM | POA: Diagnosis not present

## 2020-12-22 DIAGNOSIS — I10 Essential (primary) hypertension: Secondary | ICD-10-CM | POA: Diagnosis not present

## 2020-12-22 DIAGNOSIS — J45998 Other asthma: Secondary | ICD-10-CM | POA: Diagnosis not present

## 2020-12-22 DIAGNOSIS — Z720 Tobacco use: Secondary | ICD-10-CM | POA: Diagnosis not present

## 2020-12-23 DIAGNOSIS — I10 Essential (primary) hypertension: Secondary | ICD-10-CM | POA: Diagnosis not present

## 2020-12-27 ENCOUNTER — Other Ambulatory Visit (HOSPITAL_BASED_OUTPATIENT_CLINIC_OR_DEPARTMENT_OTHER): Payer: Self-pay | Admitting: Urology

## 2020-12-27 ENCOUNTER — Other Ambulatory Visit: Payer: Self-pay | Admitting: Urology

## 2020-12-27 DIAGNOSIS — N2889 Other specified disorders of kidney and ureter: Secondary | ICD-10-CM

## 2020-12-27 DIAGNOSIS — R310 Gross hematuria: Secondary | ICD-10-CM

## 2020-12-30 ENCOUNTER — Ambulatory Visit (HOSPITAL_BASED_OUTPATIENT_CLINIC_OR_DEPARTMENT_OTHER): Admission: RE | Admit: 2020-12-30 | Payer: Medicare Other | Source: Ambulatory Visit

## 2021-01-02 ENCOUNTER — Other Ambulatory Visit: Payer: Self-pay

## 2021-01-02 ENCOUNTER — Ambulatory Visit (HOSPITAL_BASED_OUTPATIENT_CLINIC_OR_DEPARTMENT_OTHER)
Admission: RE | Admit: 2021-01-02 | Discharge: 2021-01-02 | Disposition: A | Discharge status: Home / Self Care | Payer: Medicare Other | Source: Ambulatory Visit | Attending: Urology | Admitting: Urology

## 2021-01-02 DIAGNOSIS — R310 Gross hematuria: Secondary | ICD-10-CM | POA: Insufficient documentation

## 2021-01-02 DIAGNOSIS — N200 Calculus of kidney: Secondary | ICD-10-CM | POA: Diagnosis not present

## 2021-01-02 DIAGNOSIS — N2889 Other specified disorders of kidney and ureter: Secondary | ICD-10-CM | POA: Diagnosis not present

## 2021-01-02 MED ORDER — IOHEXOL 300 MG/ML  SOLN
125.0000 mL | Freq: Once | INTRAMUSCULAR | Status: AC | PRN
  Administered 2021-01-02: 125 mL via INTRAVENOUS

## 2021-01-05 LAB — POCT I-STAT CREATININE: Creatinine, Ser: 0.8 mg/dL (ref 0.44–1.00)

## 2021-01-11 DIAGNOSIS — N2889 Other specified disorders of kidney and ureter: Secondary | ICD-10-CM | POA: Diagnosis not present

## 2021-01-11 DIAGNOSIS — Z87440 Personal history of urinary (tract) infections: Secondary | ICD-10-CM | POA: Diagnosis not present

## 2021-01-11 DIAGNOSIS — N390 Urinary tract infection, site not specified: Secondary | ICD-10-CM | POA: Diagnosis not present

## 2021-01-11 DIAGNOSIS — R310 Gross hematuria: Secondary | ICD-10-CM | POA: Diagnosis not present

## 2021-01-11 DIAGNOSIS — R319 Hematuria, unspecified: Secondary | ICD-10-CM | POA: Diagnosis not present

## 2021-01-12 DIAGNOSIS — D4112 Neoplasm of uncertain behavior of left renal pelvis: Secondary | ICD-10-CM | POA: Diagnosis not present

## 2021-01-12 DIAGNOSIS — R310 Gross hematuria: Secondary | ICD-10-CM | POA: Diagnosis not present

## 2021-01-16 DIAGNOSIS — R310 Gross hematuria: Secondary | ICD-10-CM | POA: Diagnosis not present

## 2021-01-18 ENCOUNTER — Telehealth: Payer: Self-pay | Admitting: Internal Medicine

## 2021-01-18 NOTE — Telephone Encounter (Signed)
       [facility] Pre-operative Risk Assessment    Patient Name: Morgan Torres  DOB: 12-02-1950 MRN: [id number]  HEARTCARE STAFF:  - IMPORTANT!!!!!! Under Visit Info/Reason for Call, type in Other and utilize the format Clearance MM/DD/YY or Clearance TBD. Do not use dashes or single digits. - Please review there is not already an duplicate clearance open for this procedure. - If request is for dental extraction, please clarify the # of teeth to be extracted. - If the patient is currently at the dentist's office, call Pre-Op Callback Staff (MA/nurse) to input urgent request.  - If the patient is not currently in the dentist office, please route to the Pre-Op pool.  Request for surgical clearance:  What type of surgery is being performed? Cystoscopy ureteroscopy transurethral resection ureteral orifice and robotic nephroureterectomy   When is this surgery scheduled? TBD   What type of clearance is required (medical clearance vs. Pharmacy clearance to hold med vs. Both)? Both  Are there any medications that need to be held prior to surgery and how long? Asprin 81 mg 5 days prior procedure  Practice name and name of physician performing surgery? Dr. Ellison Hughs - [facility]  What is the office phone number? [place]   7.   What is the office fax number? [phone]  8.   Anesthesia type (None, local, MAC, general) ? General   Morgan Torres 01/18/2021, 2:23 PM  _________________________________________________________________   (provider comments below)

## 2021-01-19 NOTE — Telephone Encounter (Signed)
Pt is scheduled to see Laurann Montana, NP, 02/13/2021, surgery clearance will be addressed at that time. Will route back to the requesting surgeon's office to make them aware.

## 2021-01-19 NOTE — Telephone Encounter (Signed)
Primary Cardiologist:Kenneth C Hilty, MD  Chart reviewed as part of pre-operative protocol coverage. Because of Morgan Torres's past medical history and time since last visit, he/she will require a follow-up visit in order to better assess preoperative cardiovascular risk.  Pre-op covering staff: - Please schedule appointment and call patient to inform them. - Please contact requesting surgeon's office via preferred method (i.e, phone, fax) to inform them of need for appointment prior to surgery.  If applicable, this message will also be routed to pharmacy pool and/or primary cardiologist for input on holding anticoagulant/antiplatelet agent as requested below so that this information is available at time of patient's appointment.   Deberah Pelton, NP  01/19/2021, 9:45 AM

## 2021-01-23 DIAGNOSIS — D49512 Neoplasm of unspecified behavior of left kidney: Secondary | ICD-10-CM | POA: Diagnosis not present

## 2021-01-23 DIAGNOSIS — R310 Gross hematuria: Secondary | ICD-10-CM | POA: Diagnosis not present

## 2021-01-23 NOTE — Telephone Encounter (Signed)
S/w the pt and stated that the surgeon stated to [place] surgery was of urgent nature and asked if we could get her in sooner. I have scheduled the pt on DOD schedule 01/26/21 with Dr. Gilman Schmidt @ 4:30. Pt is grateful for the call ant the help today. I will send FYI update to surgeon that we have moved up her appt for pre op clearance. Will send clearance notes to MD for upcoming appt as well.

## 2021-01-24 ENCOUNTER — Other Ambulatory Visit: Payer: Self-pay | Admitting: Urology

## 2021-01-25 ENCOUNTER — Other Ambulatory Visit (HOSPITAL_COMMUNITY): Payer: Self-pay | Admitting: Urology

## 2021-01-25 ENCOUNTER — Other Ambulatory Visit: Payer: Self-pay | Admitting: Urology

## 2021-01-25 DIAGNOSIS — R1900 Intra-abdominal and pelvic swelling, mass and lump, unspecified site: Secondary | ICD-10-CM

## 2021-01-25 DIAGNOSIS — N2889 Other specified disorders of kidney and ureter: Secondary | ICD-10-CM

## 2021-01-25 NOTE — Patient Instructions (Addendum)
DUE TO COVID-19 ONLY ONE VISITOR IS ALLOWED TO COME WITH YOU AND STAY IN THE WAITING ROOM ONLY DURING PRE OP AND PROCEDURE DAY OF SURGERY. THE 1 VISITOR  MAY VISIT WITH YOU AFTER SURGERY IN YOUR PRIVATE ROOM DURING VISITING HOURS ONLY!  YOU NEED TO HAVE A COVID 19 TEST ON: 02/08/21, THIS TEST MUST BE DONE BEFORE SURGERY,  COVID TESTING SITE: [address]. Phone: [phone]. No appointment needed. Open M - F  8 AM - 3 PM. It's a drive-up.               Morgan Torres   Your procedure is scheduled on: 02/10/21   Report to [facility] Main  Entrance   Report to admitting at : 10:30 AM     Call this number if you have problems the morning of surgery [phone]    Remember: Do not eat solid food :After Midnight. Clear liquids until: 9:30 AM.  CLEAR LIQUID DIET  Foods Allowed                                                                     Foods Excluded  Coffee and tea, regular and decaf                             liquids that you cannot  Plain Jell-O any favor except red or purple                                           see through such as: Fruit ices (not with fruit pulp)                                     milk, soups, orange juice  Iced Popsicles                                    All solid food Carbonated beverages, regular and diet                                    Cranberry, grape and apple juices Sports drinks like Gatorade Lightly seasoned clear broth or consume(fat free) Sugar, honey syrup  Sample Menu Breakfast                                Lunch                                     Supper Cranberry juice                    Beef broth  Chicken broth Jell-O                                     Grape juice                           Apple juice Coffee or tea                        Jell-O                                      Popsicle                                                Coffee or tea                         Coffee or tea  _____________________________________________________________________  BRUSH YOUR TEETH MORNING OF SURGERY AND RINSE YOUR MOUTH OUT, NO CHEWING GUM CANDY OR MINTS.    Take these medicines the morning of surgery with A SIP OF WATER: metoprolol,amlodipine.Use inhalers as usual.Lorazepam as needed.                               You may not have any metal on your body including hair pins and              piercings  Do not wear jewelry, make-up, lotions, powders or perfumes, deodorant             Do not wear nail polish on your fingernails.  Do not shave  48 hours prior to surgery.    Do not bring valuables to the hospital. [place].  Contacts, dentures or bridgework may not be worn into surgery.  Leave suitcase in the car. After surgery it may be brought to your room.     Patients discharged the day of surgery will not be allowed to drive home. IF YOU ARE HAVING SURGERY AND GOING HOME THE SAME DAY, YOU MUST HAVE AN ADULT TO DRIVE YOU HOME AND BE WITH YOU FOR 24 HOURS. YOU MAY GO HOME BY TAXI OR UBER OR ORTHERWISE, BUT AN ADULT MUST ACCOMPANY YOU HOME AND STAY WITH YOU FOR 24 HOURS.  Name and phone number of your driver:  Special Instructions: N/A              Please read over the following fact sheets you were given: _____________________________________________________________________           [facility] - Preparing for Surgery Before surgery, you can play an important role.  Because skin is not sterile, your skin needs to be as free of germs as possible.  You can reduce the number of germs on your skin by washing with CHG (chlorahexidine gluconate) soap before surgery.  CHG is an antiseptic cleaner which kills germs and bonds with the skin to continue killing germs even after washing. Please DO NOT use if you have  an allergy to CHG or antibacterial soaps.  If your skin becomes reddened/irritated stop using the CHG and  inform your nurse when you arrive at Short Stay. Do not shave (including legs and underarms) for at least 48 hours prior to the first CHG shower.  You may shave your face/neck. Please follow these instructions carefully:  1.  Shower with CHG Soap the night before surgery and the  morning of Surgery.  2.  If you choose to wash your hair, wash your hair first as usual with your  normal  shampoo.  3.  After you shampoo, rinse your hair and body thoroughly to remove the  shampoo.                           4.  Use CHG as you would any other liquid soap.  You can apply chg directly  to the skin and wash                       Gently with a scrungie or clean washcloth.  5.  Apply the CHG Soap to your body ONLY FROM THE NECK DOWN.   Do not use on face/ open                           Wound or open sores. Avoid contact with eyes, ears mouth and genitals (private parts).                       Wash face,  Genitals (private parts) with your normal soap.             6.  Wash thoroughly, paying special attention to the area where your surgery  will be performed.  7.  Thoroughly rinse your body with warm water from the neck down.  8.  DO NOT shower/wash with your normal soap after using and rinsing off  the CHG Soap.                9.  Pat yourself dry with a clean towel.            10.  Wear clean pajamas.            11.  Place clean sheets on your bed the night of your first shower and do not  sleep with pets. Day of Surgery : Do not apply any lotions/deodorants the morning of surgery.  Please wear clean clothes to the hospital/surgery center.  FAILURE TO FOLLOW THESE INSTRUCTIONS MAY RESULT IN THE CANCELLATION OF YOUR SURGERY PATIENT SIGNATURE_________________________________  NURSE SIGNATURE__________________________________  ________________________________________________________________________   Morgan Torres  An incentive spirometer is a tool that can help keep your lungs clear and active.  This tool measures how well you are filling your lungs with each breath. Taking long deep breaths may help reverse or decrease the chance of developing breathing (pulmonary) problems (especially infection) following: A long period of time when you are unable to move or be active. BEFORE THE PROCEDURE  If the spirometer includes an indicator to show your best effort, your nurse or respiratory therapist will set it to a desired goal. If possible, sit up straight or lean slightly forward. Try not to slouch. Hold the incentive spirometer in an upright position. INSTRUCTIONS FOR USE  Sit on the edge of your bed if possible, or sit up as far as you can in  bed or on a chair. Hold the incentive spirometer in an upright position. Breathe out normally. Place the mouthpiece in your mouth and seal your lips tightly around it. Breathe in slowly and as deeply as possible, raising the piston or the ball toward the top of the column. Hold your breath for 3-5 seconds or for as long as possible. Allow the piston or ball to fall to the bottom of the column. Remove the mouthpiece from your mouth and breathe out normally. Rest for a few seconds and repeat Steps 1 through 7 at least 10 times every 1-2 hours when you are awake. Take your time and take a few normal breaths between deep breaths. The spirometer may include an indicator to show your best effort. Use the indicator as a goal to work toward during each repetition. After each set of 10 deep breaths, practice coughing to be sure your lungs are clear. If you have an incision (the cut made at the time of surgery), support your incision when coughing by placing a pillow or rolled up towels firmly against it. Once you are able to get out of bed, walk around indoors and cough well. You may stop using the incentive spirometer when instructed by your caregiver.  RISKS AND COMPLICATIONS Take your time so you do not get dizzy or light-headed. If you are in pain, you may  need to take or ask for pain medication before doing incentive spirometry. It is harder to take a deep breath if you are having pain. AFTER USE Rest and breathe slowly and easily. It can be helpful to keep track of a log of your progress. Your caregiver can provide you with a simple table to help with this. If you are using the spirometer at home, follow these instructions: [place] IF:  You are having difficultly using the spirometer. You have trouble using the spirometer as often as instructed. Your pain medication is not giving enough relief while using the spirometer. You develop fever of 100.5 F (38.1 C) or higher. SEEK IMMEDIATE MEDICAL CARE IF:  You cough up bloody sputum that had not been present before. You develop fever of 102 F (38.9 C) or greater. You develop worsening pain at or near the incision site. MAKE SURE YOU:  Understand these instructions. Will watch your condition. Will get help right away if you are not doing well or get worse. Document Released: 10/22/2006 Document Revised: 09/03/2011 Document Reviewed: 12/23/2006 [facility] Patient Information 2014 [place], [place].   WHAT IS A BLOOD TRANSFUSION? Blood Transfusion Information  A transfusion is the replacement of blood or some of its parts. Blood is made up of multiple cells which provide different functions. Red blood cells carry oxygen and are used for blood loss replacement. White blood cells fight against infection. Platelets control bleeding. Plasma helps clot blood. Other blood products are available for specialized needs, such as hemophilia or other clotting disorders. BEFORE THE TRANSFUSION  Who gives blood for transfusions?  Healthy volunteers who are fully evaluated to make sure their blood is safe. This is blood bank blood. Transfusion therapy is the safest it has ever been in the practice of medicine. Before blood is taken from a donor, a complete history is taken to make sure that  person has no history of diseases nor engages in risky social behavior (examples are intravenous drug use or sexual activity with multiple partners). The donor's travel history is screened to minimize risk of transmitting infections, such as malaria. The donated  blood is tested for signs of infectious diseases, such as HIV and hepatitis. The blood is then tested to be sure it is compatible with you in order to minimize the chance of a transfusion reaction. If you or a relative donates blood, this is often done in anticipation of surgery and is not appropriate for emergency situations. It takes many days to process the donated blood. RISKS AND COMPLICATIONS Although transfusion therapy is very safe and saves many lives, the main dangers of transfusion include:  Getting an infectious disease. Developing a transfusion reaction. This is an allergic reaction to something in the blood you were given. Every precaution is taken to prevent this. The decision to have a blood transfusion has been considered carefully by your caregiver before blood is given. Blood is not given unless the benefits outweigh the risks. AFTER THE TRANSFUSION Right after receiving a blood transfusion, you will usually feel much better and more energetic. This is especially true if your red blood cells have gotten low (anemic). The transfusion raises the level of the red blood cells which carry oxygen, and this usually causes an energy increase. The nurse administering the transfusion will monitor you carefully for complications. HOME CARE INSTRUCTIONS  No special instructions are needed after a transfusion. You may find your energy is better. Speak with your caregiver about any limitations on activity for underlying diseases you may have. SEEK MEDICAL CARE IF:  Your condition is not improving after your transfusion. You develop redness or irritation at the intravenous (IV) site. SEEK IMMEDIATE MEDICAL CARE IF:  Any of the following  symptoms occur over the next 12 hours: Shaking chills. You have a temperature by mouth above 102 F (38.9 C), not controlled by medicine. Chest, back, or muscle pain. People around you feel you are not acting correctly or are confused. Shortness of breath or difficulty breathing. Dizziness and fainting. You get a rash or develop hives. You have a decrease in urine output. Your urine turns a dark color or changes to pink, red, or brown. Any of the following symptoms occur over the next 10 days: You have a temperature by mouth above 102 F (38.9 C), not controlled by medicine. Shortness of breath. Weakness after normal activity. The white part of the eye turns yellow (jaundice). You have a decrease in the amount of urine or are urinating less often. Your urine turns a dark color or changes to pink, red, or brown. Document Released: 06/08/2000 Document Revised: 09/03/2011 Document Reviewed: 01/26/2008 [facility] Patient Information 2014 [place], [place].  _______________________________________________________________________  ________________________________________________________________________

## 2021-01-26 ENCOUNTER — Other Ambulatory Visit: Payer: Self-pay

## 2021-01-26 ENCOUNTER — Ambulatory Visit: Payer: Medicare Other | Admitting: Cardiology

## 2021-01-26 ENCOUNTER — Encounter: Payer: Self-pay | Admitting: Cardiology

## 2021-01-26 ENCOUNTER — Encounter (HOSPITAL_COMMUNITY)
Admission: RE | Admit: 2021-01-26 | Discharge: 2021-01-26 | Disposition: A | Discharge status: Home / Self Care | Payer: Medicare Other | Source: Ambulatory Visit | Attending: Urology | Admitting: Urology

## 2021-01-26 ENCOUNTER — Encounter (HOSPITAL_COMMUNITY): Payer: Self-pay

## 2021-01-26 VITALS — BP 124/70 | HR 56 | Ht 68.0 in | Wt 215.2 lb

## 2021-01-26 DIAGNOSIS — I129 Hypertensive chronic kidney disease with stage 1 through stage 4 chronic kidney disease, or unspecified chronic kidney disease: Secondary | ICD-10-CM | POA: Diagnosis not present

## 2021-01-26 DIAGNOSIS — Z7982 Long term (current) use of aspirin: Secondary | ICD-10-CM | POA: Insufficient documentation

## 2021-01-26 DIAGNOSIS — Z79899 Other long term (current) drug therapy: Secondary | ICD-10-CM | POA: Insufficient documentation

## 2021-01-26 DIAGNOSIS — G4730 Sleep apnea, unspecified: Secondary | ICD-10-CM | POA: Diagnosis not present

## 2021-01-26 DIAGNOSIS — Z01818 Encounter for other preprocedural examination: Secondary | ICD-10-CM | POA: Insufficient documentation

## 2021-01-26 DIAGNOSIS — N2889 Other specified disorders of kidney and ureter: Secondary | ICD-10-CM | POA: Diagnosis not present

## 2021-01-26 DIAGNOSIS — Z01810 Encounter for preprocedural cardiovascular examination: Secondary | ICD-10-CM | POA: Diagnosis not present

## 2021-01-26 DIAGNOSIS — N189 Chronic kidney disease, unspecified: Secondary | ICD-10-CM | POA: Insufficient documentation

## 2021-01-26 DIAGNOSIS — J449 Chronic obstructive pulmonary disease, unspecified: Secondary | ICD-10-CM | POA: Diagnosis not present

## 2021-01-26 DIAGNOSIS — R0609 Other forms of dyspnea: Secondary | ICD-10-CM

## 2021-01-26 DIAGNOSIS — F17200 Nicotine dependence, unspecified, uncomplicated: Secondary | ICD-10-CM | POA: Diagnosis not present

## 2021-01-26 DIAGNOSIS — R0600 Dyspnea, unspecified: Secondary | ICD-10-CM | POA: Diagnosis not present

## 2021-01-26 HISTORY — DX: Chronic kidney disease, unspecified: N18.9

## 2021-01-26 HISTORY — DX: Sleep apnea, unspecified: G47.30

## 2021-01-26 LAB — CBC
HCT: 42.7 % (ref 36.0–46.0)
Hemoglobin: 13.7 g/dL (ref 12.0–15.0)
MCH: 31.1 pg (ref 26.0–34.0)
MCHC: 32.1 g/dL (ref 30.0–36.0)
MCV: 97 fL (ref 80.0–100.0)
Platelets: 300 10*3/uL (ref 150–400)
RBC: 4.4 MIL/uL (ref 3.87–5.11)
RDW: 13.6 % (ref 11.5–15.5)
WBC: 5.8 10*3/uL (ref 4.0–10.5)
nRBC: 0 % (ref 0.0–0.2)

## 2021-01-26 LAB — BASIC METABOLIC PANEL
Anion gap: 5 (ref 5–15)
BUN: 15 mg/dL (ref 8–23)
CO2: 26 mmol/L (ref 22–32)
Calcium: 9.2 mg/dL (ref 8.9–10.3)
Chloride: 105 mmol/L (ref 98–111)
Creatinine, Ser: 0.91 mg/dL (ref 0.44–1.00)
GFR, Estimated: >60 mL/min (ref >60)
Glucose, Bld: 98 mg/dL (ref 70–99)
Potassium: 4.1 mmol/L (ref 3.5–5.1)
Sodium: 136 mmol/L (ref 135–145)

## 2021-01-26 NOTE — Patient Instructions (Addendum)
Medication Instructions:  No Changes In Medications at this time.  *If you need a refill on your cardiac medications before your next appointment, please call your pharmacy*  Testing/Procedures: Your physician has requested that you have a lexiscan myoview This will take place at [address] . For further information please visit [email or web address]. Please follow instruction sheet, as given.   The test will take approximately 3 to 4 hours to complete; you may bring reading material.  If someone comes with you to your appointment, they will need to remain in the main lobby due to limited space in the testing area.    How to prepare for your Myocardial Perfusion Test: Do not eat or drink 3 hours prior to your test, except you may have water. Do not consume products containing caffeine (regular or decaffeinated) 12 hours prior to your test. (ex: coffee, chocolate, sodas, tea). Do wear comfortable clothes (no dresses or overalls) and walking shoes, tennis shoes preferred (No heels or open toe shoes are allowed). Do NOT wear cologne, perfume, aftershave, or lotions (deodorant is allowed). If you use an inhaler, use it the AM of your test and bring it with you.  If you use a nebulizer, use it the AM of your test.  If these instructions are not followed, your test will have to be rescheduled. Your physician has requested that you have an echocardiogram. Echocardiography is a painless test that uses sound waves to create images of your heart. It provides your doctor with information about the size and shape of your heart and how well your heart's chambers and valves are working. You may receive an ultrasound enhancing agent through an IV if needed to better visualize your heart during the echo.This procedure takes approximately one hour. There are no restrictions for this procedure. This will take place at the [address].   Follow-Up: At [facility], you and your  health needs are our priority.  As part of our continuing mission to provide you with exceptional heart care, we have created designated Provider Care Teams.  These Care Teams include your primary Cardiologist (physician) and Advanced Practice Providers (APPs -  Physician Assistants and Nurse Practitioners) who all work together to provide you with the care you need, when you need it.  Your next appointment:   6 month(s)  The format for your next appointment:   In Person  Provider:   Oswaldo Milian, MD

## 2021-01-26 NOTE — Progress Notes (Addendum)
COVID Vaccine Completed: Yes Date COVID Vaccine completed: 05/27/21. [place] COVID vaccine manufacturer: Pfizer      PCP - Dr. [name] Cardiologist - Dr. Lyman Bishop.  Chest x-ray -  EKG -  Stress Test -  ECHO - 04/29/14 Cardiac Cath -  Pacemaker/ICD device last checked:  Sleep Study - Yes CPAP - Yes  Fasting Blood Sugar -  Checks Blood Sugar _____ times a day  Blood Thinner Instructions:Dr. Winter Aspirin Instructions: On hold. Last Dose:  Anesthesia review: Hx: HTN,MI,COPD,CAD, smoker,OSA(CPAP)  Patient denies shortness of breath, fever, cough and chest pain at PAT appointment   Patient verbalized understanding of instructions that were given to them at the PAT appointment. Patient was also instructed that they will need to review over the PAT instructions again at home before surgery.

## 2021-01-26 NOTE — Progress Notes (Signed)
Cardiology Office Note:    Date:  01/26/2021   ID:  Morgan Torres, DOB 1950/12/28, MRN [id number]  PCP:  [name], MD  Cardiologist:  Pixie Casino, MD  Electrophysiologist:  None   Referring MD: [name], MD   Chief Complaint  Patient presents with   Pre-op Exam     History of Present Illness:    Morgan Torres is a 70 y.o. female with a hx of nonobstructive CAD, Takotubo cardiomyopathy, HTN, HLD, OSA, tobacco use, COPD, CKD who presents for preop evaluation.  CT abdomen pelvis on 01/02/2021 shows mass left kidney with imaging features consistent with transitional cell carcinoma.  Planning resection with urology on 02/10/2021.  She had an NSTEMI in 2014, cath showed moderate nonobstructive CAD.  Felt to likely be Takotsubo cardiomyopathy, subsequent echo showed EF 55%.  She denies any chest pain.  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  She does not exercise, but occasionally will go for short walks.  She does report dyspnea on exertion.  States that when walking up stairs she has to stop halfway through to catch her breath.  She denies any lightheadedness or syncope.  Denies any lower extremity edema.  Past Medical History:  Diagnosis Date   Anxiety    Arthritis    hip, lumbar spine    Asthma    seasonal allergies    Bronchitis    Hx: of   Chronic kidney disease    COPD (chronic obstructive pulmonary disease) (HCC)    Coronary artery disease    Depression    Fibromyalgia    Hypertension    Lumbar herniated disc    Myocardial infarction ([place]) 06/25/2012   followed by Dr. Debara Pickett, treated medically, no stents   Pneumonia    Hx: of   Sciatica    Sleep apnea    Small bowel obstruction ([place])     Past Surgical History:  Procedure Laterality Date   ABDOMINAL HYSTERECTOMY     ABDOMINAL SURGERY     resection of small intestine   BACK SURGERY     fusion   CARDIAC CATHETERIZATION  2014   COLON SURGERY     resection for bowel - for obstruction     COLONOSCOPY     Hx; of   DILATION AND CURETTAGE OF UTERUS     HERNIA REPAIR  1970's   inguinal hernia    LEFT HEART CATHETERIZATION WITH CORONARY ANGIOGRAM N/A 03/30/2013   Procedure: LEFT HEART CATHETERIZATION WITH CORONARY ANGIOGRAM;  Surgeon: Leonie Man, MD;  Location: [facility] CATH LAB;  Service: Cardiovascular;  Laterality: N/A;   SALIVARY GLAND SURGERY Left 1970-1980   approached from inside mouth & side of neck   TONSILLECTOMY     TOTAL HIP ARTHROPLASTY Left 05/28/2014   Procedure: LEFT TOTAL HIP ARTHROPLASTY;  Surgeon: Yvette Rack., MD;  Location: [facility];  Service: Orthopedics;  Laterality: Left;   TOTAL HIP ARTHROPLASTY Left 05/28/2014   dr French Ana    Current Medications: Current Meds  Medication Sig   acetaminophen (TYLENOL) 500 MG tablet Take 2 tablets (1,000 mg total) by mouth every 6 (six) hours as needed for mild pain.   albuterol (PROVENTIL HFA;VENTOLIN HFA) 108 (90 Base) MCG/ACT inhaler Inhale 2 puffs into the lungs every 6 (six) hours as needed for wheezing or shortness of breath.   amLODipine (NORVASC) 5 MG tablet TAKE 1 TABLET BY MOUTH EVERY DAY (Patient taking differently: Take 5 mg by mouth daily.)  LORazepam (ATIVAN) 0.5 MG tablet Take 0.5 mg by mouth daily as needed for anxiety.   losartan-hydrochlorothiazide (HYZAAR) 100-12.5 MG tablet Take 1 tablet by mouth daily.   magnesium hydroxide (MILK OF MAGNESIA) 400 MG/5ML suspension Take 15 mLs by mouth daily as needed for mild constipation.   metoprolol tartrate (LOPRESSOR) 25 MG tablet Take 1 tablet (25 mg total) by mouth 2 (two) times daily. Pt needs to make appt with provider for further refills - 1st attempt (Patient taking differently: Take 12.5 mg by mouth 2 (two) times daily.)   montelukast (SINGULAIR) 10 MG tablet Take 10 mg by mouth at bedtime.   Multiple Vitamin (MULTIVITAMIN WITH MINERALS) TABS tablet Take 1 tablet by mouth daily.     Allergies:   Diclofenac, Codeine, Hydrocodone, and Ciprofloxacin    Social History   Socioeconomic History   Marital status: Married    Spouse name: Not on file   Number of children: Not on file   Years of education: Not on file   Highest education level: Not on file  Occupational History   Not on file  Tobacco Use   Smoking status: Every Day    Packs/day: 0.40    Years: 25.00    Pack years: 10.00    Types: Cigarettes   Smokeless tobacco: Never   Tobacco comments:    Currently on the Nicoderm patch."smokes a few"  Vaping Use   Vaping Use: Never used  Substance and Sexual Activity   Alcohol use: Yes    Comment: occasionally   Drug use: No   Sexual activity: Not Currently  Other Topics Concern   Not on file  Social History Narrative   Not on file   Social Determinants of Health   Financial Resource Strain: Not on file  Food Insecurity: Not on file  Transportation Needs: Not on file  Physical Activity: Not on file  Stress: Not on file  Social Connections: Not on file     Family History: The patient's family history includes Cancer in her sister; Cancer - Other in her mother; Diabetes in her mother; Hypertension in her mother.  ROS:   Please see the history of present illness.     All other systems reviewed and are negative.  EKGs/Labs/Other Studies Reviewed:    The following studies were reviewed today:   EKG:  EKG is  ordered today.  The ekg ordered today demonstrates sinus bradycardia, rate 52, low voltage, no ST abnormalities  Recent Labs: 01/26/2021: BUN 15; Creatinine, Ser 0.91; Hemoglobin 13.7; Platelets 300; Potassium 4.1; Sodium 136  Recent Lipid Panel    Component Value Date/Time   CHOL 231 (H) 03/30/2013 0929   TRIG 105 03/30/2013 0929   HDL 51 03/30/2013 0929   CHOLHDL 4.5 03/30/2013 0929   VLDL 21 03/30/2013 0929   LDLCALC 159 (H) 03/30/2013 0929    Physical Exam:    VS:  BP 124/70 (BP Location: Left Arm, Patient Position: Sitting, Cuff Size: Large)   Pulse (!) 56   Ht '5\' 8"'$  (1.727 m)   Wt 215 lb 3.2  oz (97.6 kg)   SpO2 94%   BMI 32.72 kg/m     Wt Readings from Last 3 Encounters:  01/26/21 215 lb 3.2 oz (97.6 kg)  01/26/21 212 lb (96.2 kg)  12/23/19 244 lb 9.6 oz (110.9 kg)     GEN:  Well nourished, well developed in no acute distress HEENT: Normal NECK: No JVD; No carotid bruits LYMPHATICS: No lymphadenopathy CARDIAC: RRR, no  murmurs, rubs, gallops RESPIRATORY:  Clear to auscultation without rales, wheezing or rhonchi  ABDOMEN: Soft, non-tender, non-distended MUSCULOSKELETAL:  No edema; No deformity  SKIN: Warm and dry NEUROLOGIC:  Alert and oriented x 3 PSYCHIATRIC:  Normal affect   ASSESSMENT:    1. Pre-operative cardiovascular examination   2. Dyspnea on exertion    PLAN:    Pre-op exam: Prior to resection of kidney mass.  She reports dyspnea on exertion, denies any chest pain.  Reports she cannot walk up a flight of stairs without stopping due to shortness of breath.  Given this, recommend Lexiscan Myoview and echocardiogram for further evaluation prior to surgery.  DOE: Plan Lexiscan Myoview and echocardiogram as above  Shared Decision Making/Informed Consent The risks [chest pain, shortness of breath, cardiac arrhythmias, dizziness, blood pressure fluctuations, myocardial infarction, stroke/transient ischemic attack, nausea, vomiting, allergic reaction, radiation exposure, metallic taste sensation and life-threatening complications (estimated to be 1 in 10,000)], benefits (risk stratification, diagnosing coronary artery disease, treatment guidance) and alternatives of a nuclear stress test were discussed in detail with Morgan Torres and she agrees to proceed.   Medication Adjustments/Labs and Tests Ordered: Current medicines are reviewed at length with the patient today.  Concerns regarding medicines are outlined above.  Orders Placed This Encounter  Procedures   MYOCARDIAL PERFUSION IMAGING   ECHOCARDIOGRAM COMPLETE    No orders of the defined types were placed  in this encounter.   Patient Instructions  Medication Instructions:  No Changes In Medications at this time.  *If you need a refill on your cardiac medications before your next appointment, please call your pharmacy*  Testing/Procedures: Your physician has requested that you have a lexiscan myoview This will take place at [address] . For further information please visit [email or web address]. Please follow instruction sheet, as given.   The test will take approximately 3 to 4 hours to complete; you may bring reading material.  If someone comes with you to your appointment, they will need to remain in the main lobby due to limited space in the testing area.    How to prepare for your Myocardial Perfusion Test: Do not eat or drink 3 hours prior to your test, except you may have water. Do not consume products containing caffeine (regular or decaffeinated) 12 hours prior to your test. (ex: coffee, chocolate, sodas, tea). Do wear comfortable clothes (no dresses or overalls) and walking shoes, tennis shoes preferred (No heels or open toe shoes are allowed). Do NOT wear cologne, perfume, aftershave, or lotions (deodorant is allowed). If you use an inhaler, use it the AM of your test and bring it with you.  If you use a nebulizer, use it the AM of your test.  If these instructions are not followed, your test will have to be rescheduled. Your physician has requested that you have an echocardiogram. Echocardiography is a painless test that uses sound waves to create images of your heart. It provides your doctor with information about the size and shape of your heart and how well your heart's chambers and valves are working. You may receive an ultrasound enhancing agent through an IV if needed to better visualize your heart during the echo.This procedure takes approximately one hour. There are no restrictions for this procedure. This will take place at the [address].    Follow-Up: At [facility], you and your health needs are our priority.  As part of our continuing mission to provide you with  exceptional heart care, we have created designated Provider Care Teams.  These Care Teams include your primary Cardiologist (physician) and Advanced Practice Providers (APPs -  Physician Assistants and Nurse Practitioners) who all work together to provide you with the care you need, when you need it.  Your next appointment:   6 month(s)  The format for your next appointment:   In Person  Provider:   Oswaldo Milian, MD    Signed, Donato Heinz, MD  01/26/2021 5:39 PM    [place]

## 2021-01-30 ENCOUNTER — Encounter (HOSPITAL_COMMUNITY): Payer: Self-pay | Admitting: *Deleted

## 2021-01-30 ENCOUNTER — Telehealth (HOSPITAL_COMMUNITY): Payer: Self-pay | Admitting: *Deleted

## 2021-01-30 NOTE — Telephone Encounter (Signed)
Patient given detailed instructions per Myocardial Perfusion Study Information Sheet for the test on 01/31/21 Patient notified to arrive 15 minutes early and that it is imperative to arrive on time for appointment to keep from having the test rescheduled.  If you need to cancel or reschedule your appointment, please call the office within 24 hours of your appointment. . Patient verbalized understanding. Kirstie Peri

## 2021-01-31 ENCOUNTER — Ambulatory Visit (HOSPITAL_COMMUNITY): Payer: Medicare Other | Attending: Cardiology

## 2021-01-31 ENCOUNTER — Other Ambulatory Visit: Payer: Self-pay

## 2021-01-31 DIAGNOSIS — R0600 Dyspnea, unspecified: Secondary | ICD-10-CM | POA: Diagnosis not present

## 2021-01-31 DIAGNOSIS — R0609 Other forms of dyspnea: Secondary | ICD-10-CM

## 2021-01-31 LAB — MYOCARDIAL PERFUSION IMAGING
LV dias vol: 63 mL (ref 46–106)
LV sys vol: 20 mL
Peak HR: 64 {beats}/min
Rest HR: 47 {beats}/min
SDS: 1
SRS: 0
SSS: 1
TID: 0.92

## 2021-01-31 MED ORDER — REGADENOSON 0.4 MG/5ML IV SOLN
0.4000 mg | Freq: Once | INTRAVENOUS | Status: AC
  Administered 2021-01-31: 0.4 mg via INTRAVENOUS

## 2021-01-31 MED ORDER — TECHNETIUM TC 99M TETROFOSMIN IV KIT
31.5000 | PACK | Freq: Once | INTRAVENOUS | Status: AC | PRN
  Administered 2021-01-31: 31.5 via INTRAVENOUS
  Filled 2021-01-31: qty 32

## 2021-01-31 MED ORDER — TECHNETIUM TC 99M TETROFOSMIN IV KIT
10.9000 | PACK | Freq: Once | INTRAVENOUS | Status: AC | PRN
  Administered 2021-01-31: 10.9 via INTRAVENOUS
  Filled 2021-01-31: qty 11

## 2021-02-01 ENCOUNTER — Encounter (HOSPITAL_COMMUNITY): Payer: Self-pay | Admitting: Anesthesiology

## 2021-02-01 ENCOUNTER — Encounter (HOSPITAL_COMMUNITY): Payer: Self-pay | Admitting: Physician Assistant

## 2021-02-01 NOTE — Progress Notes (Addendum)
Anesthesia Chart Review   Case: [id number] Date/Time: 02/10/21 1215   Procedures:      XI ROBOT ASSITED LAPAROSCOPIC NEPHROURETERECTOMY/ CYSTOSCOPY WITH LEFT URETEROSCOPY WITH TRANSURETHRAL RESECTION OF URETERAL ORIFICE (Left)     BIOPSY TRANSRECTAL ULTRASONIC PROSTATE (TUBP)   Anesthesia type: General   Pre-op diagnosis: LEFT RENAL PELVIC MASS   Location: [facility] ROOM 03 / [facility]   Surgeons: Ceasar Mons, MD       DISCUSSION:70 y.o. ever day smoker with h/o HTN, COPD, sleep apnea, CAD, CKD, left renal pelvic mass scheduled for above procedure 02/10/2021 with Dr. Harrell Gave Lovena Neighbours.   Pt seen by cardiologist 01/26/2021. Per OV note, "Pre-op exam: Prior to resection of kidney mass.  She reports dyspnea on exertion, denies any chest pain.  Reports she cannot walk up a flight of stairs without stopping due to shortness of breath.  Given this, recommend Lexiscan Myoview and echocardiogram for further evaluation prior to surgery."  Stress test 01/31/2021, low risk stress test.   Echo scheduled 02/02/21.   Addendum 02/03/2021:  Echo 02/02/21 with EF 60-65%, trivial MR, no aortic stenosis.  VS: BP (!) 147/68   Pulse 61   Temp 36.8 C (Oral)   Ht '5\' 8"'$  (1.727 m)   Wt 96.2 kg   SpO2 98%   BMI 32.23 kg/m   PROVIDERS: [name], MD is PCP   Oswaldo Milian, MD is Cardiologist  LABS: Labs reviewed: Acceptable for surgery. (all labs ordered are listed, but only abnormal results are displayed)  Labs Reviewed  CBC  BASIC METABOLIC PANEL     IMAGES:   EKG: 01/26/2021 Rate 52 bpm  Sinus bradycardia with sinus arrhythmia Low voltage QRS Borderline ECG No significant change since last tracing  CV: Stress Test 01/31/2021 The left ventricular ejection fraction is hyperdynamic (>65%). Nuclear stress EF: 68%. There was no ST segment deviation noted during stress. The study is normal. This is a low risk study.   Normal stress nuclear study with no ischemia or infarction.   Gated ejection fraction 68% with normal wall motion.  Echo 04/29/2014 - Left ventricle: The cavity size was normal. Systolic function was    normal. The estimated ejection fraction was in the range of 55%    to 60%. Wall motion was normal; there were no regional wall    motion abnormalities. There was an increased relative    contribution of atrial contraction to ventricular filling. Past Medical History:  Diagnosis Date   Anxiety    Arthritis    hip, lumbar spine    Asthma    seasonal allergies    Bronchitis    Hx: of   Chronic kidney disease    COPD (chronic obstructive pulmonary disease) (HCC)    Coronary artery disease    Depression    Fibromyalgia    Hypertension    Lumbar herniated disc    Myocardial infarction ([place]) 06/25/2012   followed by Dr. Debara Pickett, treated medically, no stents   Pneumonia    Hx: of   Sciatica    Sleep apnea    Small bowel obstruction ([place])     Past Surgical History:  Procedure Laterality Date   ABDOMINAL HYSTERECTOMY     ABDOMINAL SURGERY     resection of small intestine   BACK SURGERY     fusion   CARDIAC CATHETERIZATION  2014   COLON SURGERY     resection for bowel - for obstruction    COLONOSCOPY     Hx; of  DILATION AND CURETTAGE OF UTERUS     HERNIA REPAIR  1970's   inguinal hernia    LEFT HEART CATHETERIZATION WITH CORONARY ANGIOGRAM N/A 03/30/2013   Procedure: LEFT HEART CATHETERIZATION WITH CORONARY ANGIOGRAM;  Surgeon: Leonie Man, MD;  Location: [facility] CATH LAB;  Service: Cardiovascular;  Laterality: N/A;   SALIVARY GLAND SURGERY Left 1970-1980   approached from inside mouth & side of neck   TONSILLECTOMY     TOTAL HIP ARTHROPLASTY Left 05/28/2014   Procedure: LEFT TOTAL HIP ARTHROPLASTY;  Surgeon: Yvette Rack., MD;  Location: [place];  Service: Orthopedics;  Laterality: Left;   TOTAL HIP ARTHROPLASTY Left 05/28/2014   dr caffrey    MEDICATIONS:  acetaminophen (TYLENOL) 500 MG tablet   albuterol (PROVENTIL HFA;VENTOLIN  HFA) 108 (90 Base) MCG/ACT inhaler   amLODipine (NORVASC) 5 MG tablet   aspirin 81 MG tablet   atorvastatin (LIPITOR) 20 MG tablet   cetirizine (ZYRTEC) 10 MG tablet   cyclobenzaprine (FLEXERIL) 10 MG tablet   EPINEPHrine (EPIPEN 2-PAK) 0.3 mg/0.3 mL DEVI   LORazepam (ATIVAN) 0.5 MG tablet   losartan-hydrochlorothiazide (HYZAAR) 100-12.5 MG tablet   magnesium hydroxide (MILK OF MAGNESIA) 400 MG/5ML suspension   metoprolol tartrate (LOPRESSOR) 25 MG tablet   montelukast (SINGULAIR) 10 MG tablet   Multiple Vitamin (MULTIVITAMIN WITH MINERALS) TABS tablet   nicotine (NICODERM CQ - DOSED IN MG/24 HOURS) 21 mg/24hr patch   nicotine polacrilex (NICORETTE) 4 MG gum   nitroGLYCERIN (NITROSTAT) 0.4 MG SL tablet   No current facility-administered medications for this encounter.    Konrad Felix, PA-C [facility] [phone]

## 2021-02-02 ENCOUNTER — Other Ambulatory Visit: Payer: Self-pay

## 2021-02-02 ENCOUNTER — Ambulatory Visit (HOSPITAL_COMMUNITY)
Admission: RE | Admit: 2021-02-02 | Discharge: 2021-02-02 | Disposition: A | Discharge status: Home / Self Care | Payer: Medicare Other | Source: Ambulatory Visit | Attending: Cardiology | Admitting: Cardiology

## 2021-02-02 DIAGNOSIS — I252 Old myocardial infarction: Secondary | ICD-10-CM | POA: Insufficient documentation

## 2021-02-02 DIAGNOSIS — I10 Essential (primary) hypertension: Secondary | ICD-10-CM | POA: Diagnosis not present

## 2021-02-02 DIAGNOSIS — R0609 Other forms of dyspnea: Secondary | ICD-10-CM | POA: Diagnosis not present

## 2021-02-02 DIAGNOSIS — I358 Other nonrheumatic aortic valve disorders: Secondary | ICD-10-CM | POA: Insufficient documentation

## 2021-02-02 DIAGNOSIS — I2510 Atherosclerotic heart disease of native coronary artery without angina pectoris: Secondary | ICD-10-CM | POA: Insufficient documentation

## 2021-02-02 DIAGNOSIS — E785 Hyperlipidemia, unspecified: Secondary | ICD-10-CM | POA: Diagnosis not present

## 2021-02-02 DIAGNOSIS — R0600 Dyspnea, unspecified: Secondary | ICD-10-CM

## 2021-02-02 DIAGNOSIS — F17210 Nicotine dependence, cigarettes, uncomplicated: Secondary | ICD-10-CM | POA: Insufficient documentation

## 2021-02-02 DIAGNOSIS — G4730 Sleep apnea, unspecified: Secondary | ICD-10-CM | POA: Insufficient documentation

## 2021-02-02 DIAGNOSIS — I429 Cardiomyopathy, unspecified: Secondary | ICD-10-CM | POA: Diagnosis not present

## 2021-02-02 DIAGNOSIS — J449 Chronic obstructive pulmonary disease, unspecified: Secondary | ICD-10-CM | POA: Insufficient documentation

## 2021-02-02 LAB — ECHOCARDIOGRAM COMPLETE
Area-P 1/2: 3.27 cm2
Calc EF: 63.1 %
S' Lateral: 2.8 cm
Single Plane A2C EF: 60.2 %
Single Plane A4C EF: 63.9 %

## 2021-02-02 NOTE — Progress Notes (Signed)
  Echocardiogram 2D Echocardiogram has been performed.  Morgan Torres 02/02/2021, 9:58 AM

## 2021-02-03 ENCOUNTER — Telehealth: Payer: Self-pay | Admitting: Internal Medicine

## 2021-02-03 NOTE — Telephone Encounter (Signed)
Called patient back, pt wanted to review her echocardiogram and lexiscan results. Relayed the following from Dr. Gardiner Rhyme regarding the lexiscan:  Donato Heinz, MD  02/01/2021  6:50 AM EDT     Normal stress test   Pt verbalized understanding. Nurse let patient know that her echocardiogram results had not yet been reviewed by Dr. Gardiner Rhyme. Advised pt when Dr. Gardiner Rhyme reviews the result he will input a result note with any necessary changes to treatment. Pt verbalized understanding. All questions/concerns addressed at this time, pt made aware to call back if she has any other concerns.

## 2021-02-03 NOTE — Anesthesia Preprocedure Evaluation (Deleted)
Anesthesia Evaluation    Reviewed: Allergy & Precautions, Patient's Chart, lab work & pertinent test results, reviewed documented beta blocker date and time   History of Anesthesia Complications Negative for: history of anesthetic complications  Airway        Dental   Pulmonary asthma , sleep apnea , COPD,  COPD inhaler, Current Smoker,           Cardiovascular hypertension, Pt. on medications and Pt. on home beta blockers + CAD and + Past MI    TTE 02/02/21: EF 60-65%, mild LVH, valves ok   Neuro/Psych Anxiety Depression    GI/Hepatic negative GI ROS, Neg liver ROS,   Endo/Other  negative endocrine ROS  Renal/GU Renal InsufficiencyRenal disease  negative genitourinary   Musculoskeletal  (+) Arthritis , Fibromyalgia -  Abdominal   Peds  Hematology negative hematology ROS (+)   Anesthesia Other Findings Day of surgery medications reviewed with patient.  Reproductive/Obstetrics negative OB ROS                            Anesthesia Physical Anesthesia Plan  ASA: 3  Anesthesia Plan: General   Post-op Pain Management:    Induction: Intravenous  PONV Risk Score and Plan: 4 or greater and Treatment may vary due to age or medical condition, Ondansetron and Dexamethasone  Airway Management Planned: Oral ETT  Additional Equipment: None  Intra-op Plan:   Post-operative Plan: Extubation in OR  Informed Consent:   Plan Discussed with:   Anesthesia Plan Comments: (See PAT note 01/26/2021, Konrad Felix, PA-C)       Anesthesia Quick Evaluation

## 2021-02-03 NOTE — Telephone Encounter (Signed)
Patient would like to get a call back to discuss her results that are in [place]. Please advise

## 2021-02-06 NOTE — Telephone Encounter (Signed)
Echocardiogram shows no significant abnormalities

## 2021-02-06 NOTE — Telephone Encounter (Signed)
Attempted to call patient, unable to reach patient or leave VM at this time. Will try again at another time.

## 2021-02-06 NOTE — Telephone Encounter (Signed)
Patient calling wanting to know her results she stated somebody was supposed to be calling her back. Please advise

## 2021-02-06 NOTE — Telephone Encounter (Signed)
Returned call to patient who states she was calling in regard to her Echo results. Advised patient as soon as Dr. Gardiner Rhyme reviews the Echo someone will give her a call to go over the results with her. Patient states that her Surgery is scheduled for this Friday 8/19 and would like to know before then. Advised patient I would forward message to Dr. Gardiner Rhyme. Patient verbalized understanding.

## 2021-02-07 ENCOUNTER — Encounter: Payer: Self-pay | Admitting: Cardiology

## 2021-02-07 NOTE — Telephone Encounter (Addendum)
Spoke to patient, aware of echo and stress test results.   Surgical clearance faxed to Dr. [facility].

## 2021-02-08 ENCOUNTER — Other Ambulatory Visit: Payer: Self-pay | Admitting: Urology

## 2021-02-09 ENCOUNTER — Ambulatory Visit (HOSPITAL_COMMUNITY): Payer: Medicare Other

## 2021-02-09 LAB — SARS CORONAVIRUS 2 (TAT 6-24 HRS): SARS Coronavirus 2: NEGATIVE

## 2021-02-10 ENCOUNTER — Encounter (HOSPITAL_COMMUNITY)
Admission: RE | Disposition: A | Discharge status: Home / Self Care | Payer: Self-pay | Source: Home / Self Care | Attending: Urology

## 2021-02-10 ENCOUNTER — Ambulatory Visit (HOSPITAL_COMMUNITY)
Admission: RE | Admit: 2021-02-10 | Discharge: 2021-02-10 | Disposition: A | Discharge status: Home / Self Care | Payer: Medicare Other | Attending: Urology | Admitting: Urology

## 2021-02-10 ENCOUNTER — Ambulatory Visit (HOSPITAL_COMMUNITY)
Admission: RE | Admit: 2021-02-10 | Discharge: 2021-02-10 | Disposition: A | Discharge status: Home / Self Care | Payer: Medicare Other | Source: Ambulatory Visit | Attending: Urology | Admitting: Urology

## 2021-02-10 DIAGNOSIS — Z538 Procedure and treatment not carried out for other reasons: Secondary | ICD-10-CM | POA: Insufficient documentation

## 2021-02-10 DIAGNOSIS — N2889 Other specified disorders of kidney and ureter: Secondary | ICD-10-CM | POA: Insufficient documentation

## 2021-02-10 SURGERY — NEPHROURETERECTOMY, ROBOT-ASSISTED, LAPAROSCOPIC
Anesthesia: General | Laterality: Left

## 2021-02-10 MED ORDER — CEFAZOLIN SODIUM-DEXTROSE 2-4 GM/100ML-% IV SOLN
2.0000 g | Freq: Once | INTRAVENOUS | Status: DC
  Filled 2021-02-10: qty 100

## 2021-02-10 MED ORDER — ORAL CARE MOUTH RINSE: 15.0000 mL | Freq: Once | OROMUCOSAL | Status: DC

## 2021-02-10 MED ORDER — FENTANYL CITRATE (PF) 250 MCG/5ML IJ SOLN
INTRAMUSCULAR | Status: AC
  Filled 2021-02-10: qty 5

## 2021-02-10 MED ORDER — LACTATED RINGERS IV SOLN: INTRAVENOUS | Status: DC

## 2021-02-10 MED ORDER — PROPOFOL 10 MG/ML IV BOLUS
INTRAVENOUS | Status: AC
  Filled 2021-02-10: qty 20

## 2021-02-10 MED ORDER — CHLORHEXIDINE GLUCONATE 0.12 % MT SOLN: 15.0000 mL | Freq: Once | OROMUCOSAL | Status: DC

## 2021-02-10 NOTE — Progress Notes (Signed)
Upon pre-op assessment, pt states she had coffee with creamer at 0700 today. Dr. Daiva Huge and Dr. Lovena Neighbours made aware. Dr. Lovena Neighbours states he wants to re-schedule patient. Pt notes understanding.

## 2021-02-13 ENCOUNTER — Other Ambulatory Visit: Payer: Self-pay | Admitting: Internal Medicine

## 2021-02-13 ENCOUNTER — Ambulatory Visit (HOSPITAL_BASED_OUTPATIENT_CLINIC_OR_DEPARTMENT_OTHER): Payer: Medicare Other | Admitting: Family

## 2021-02-22 ENCOUNTER — Other Ambulatory Visit (HOSPITAL_COMMUNITY): Payer: Self-pay

## 2021-02-22 DIAGNOSIS — J45998 Other asthma: Secondary | ICD-10-CM | POA: Diagnosis not present

## 2021-02-22 DIAGNOSIS — Z720 Tobacco use: Secondary | ICD-10-CM | POA: Diagnosis not present

## 2021-02-22 DIAGNOSIS — I10 Essential (primary) hypertension: Secondary | ICD-10-CM | POA: Diagnosis not present

## 2021-02-22 DIAGNOSIS — J449 Chronic obstructive pulmonary disease, unspecified: Secondary | ICD-10-CM | POA: Diagnosis not present

## 2021-02-23 NOTE — Progress Notes (Addendum)
COVID swab appointment:   03-07-21  COVID Vaccine Completed:  Yes x4 Date COVID Vaccine completed: Has received booster: Yes x4 COVID vaccine manufacturer: [place]   Date of COVID positive in last 90 days:  No  PCP - [name], MD.  Office note requested. Cardiologist - Lyman Bishop, MD  Chest x-ray - N/A EKG - 01-26-21 Epic Stress Test - 01-31-21 Epic ECHO - 02-02-21 Epic Cardiac Cath -  Pacemaker/ICD device last checked: Spinal Cord Stimulator:  Sleep Study - Yes 2018, +sleep apnea CPAP - Yes  Fasting Blood Sugar - N/A Checks Blood Sugar _____ times a day  Blood Thinner Instructions:  N/A Aspirin Instructions: Last Dose:  Activity level:  Can go up a flight of stairs and perform activities of daily living without stopping and without symptoms of chest pain or shortness of breath.     Anesthesia review:  Cardiomyopathy, Hx of NSTEMI, COPD, HTN, OSA, CKD  Patient denies shortness of breath, fever, cough and chest pain at PAT appointment   Patient verbalized understanding of instructions that were given to them at the PAT appointment. Patient was also instructed that they will need to review over the PAT instructions again at home before surgery.

## 2021-02-23 NOTE — Patient Instructions (Addendum)
DUE TO COVID-19 ONLY ONE VISITOR IS ALLOWED TO COME WITH YOU AND STAY IN THE WAITING ROOM ONLY DURING PRE OP AND PROCEDURE.   **NO VISITORS ARE ALLOWED IN THE SHORT STAY AREA OR RECOVERY ROOM!!**  IF YOU WILL BE ADMITTED INTO THE HOSPITAL YOU ARE ALLOWED ONLY TWO SUPPORT PEOPLE DURING VISITATION HOURS ONLY (10AM -8PM)   The support person(s) may change daily. The support person(s) must pass our screening, gel in and out, and wear a mask at all times, including in the patient's room. Patients must also wear a mask when staff or their support person are in the room.  No visitors under the age of 32. Any visitor under the age of 5 must be accompanied by an adult.   COVID SWAB TESTING MUST BE COMPLETED ON:  Tuesday, 03-07-21 Between the hours of 8 and 3  **MUST PRESENT COMPLETED FORM AT TESTING SITE**    [place] [facility] (backside of the building)  You are not required to quarantine, however you are required to wear a well-fitted mask when you are out and around people not in your household.  Hand Hygiene often Do NOT share personal items Notify your provider if you are in close contact with someone who has COVID or you develop fever 100.4 or greater, new onset of sneezing, cough, sore throat, shortness of breath or body aches.    Your procedure is scheduled on: Thursday, 03-09-21   Report to [facility] Main  Entrance    Report to admitting at 10:00 AM   Call this number if you have problems the morning of surgery [phone]   Do not eat food :After Midnight.   May have liquids until 9:00 AM day of surgery  CLEAR LIQUID DIET  Foods Allowed                                                                     Foods Excluded  Water, Black Coffee (no milk/no creamer) and tea, regular and decaf                              liquids that you cannot  Plain Jell-O in any flavor  (No red)                         see through such as: Fruit ices (not with fruit  pulp)                                 milk, soups, orange juice  Iced Popsicles (No red)                                    All solid food                             Apple juices Sports drinks like Gatorade (No red) Lightly seasoned clear broth or consume(fat free) Sugar      Oral Hygiene is also important to  reduce your risk of infection.                                    Remember - BRUSH YOUR TEETH THE MORNING OF SURGERY WITH YOUR REGULAR TOOTHPASTE   Do NOT smoke after Midnight   Take these medicines the morning of surgery with A SIP OF WATER:  Amlodipine, Lorazepam, Metoprolol, Tramadol   Bring CPAP mask and tubing day of surgery             You may not have any metal on your body including hair pins, jewelry, and body piercing             Do not wear make-up, lotions, powders, perfumes or deodorant  Do not wear nail polish including gel and S&S, artificial/acrylic nails, or any other type of covering on natural nails including finger and toenails. If you have artificial nails, gel coating, etc. that needs to be removed by a nail salon please have this removed prior to surgery or surgery may need to be canceled/ delayed if the surgeon/ anesthesia feels like they are unable to be safely monitored.   Do not shave  48 hours prior to surgery.           Do not bring valuables to the hospital. [place].   Contacts, dentures or bridgework may not be worn into surgery.   Bring small overnight bag day of surgery.   Please read over the following fact sheets you were given: IF YOU HAVE QUESTIONS ABOUT YOUR PRE OP INSTRUCTIONS PLEASE CALL [place] - Preparing for Surgery Before surgery, you can play an important role.  Because skin is not sterile, your skin needs to be as free of germs as possible.  You can reduce the number of germs on your skin by washing with CHG (chlorahexidine gluconate) soap before surgery.  CHG is an  antiseptic cleaner which kills germs and bonds with the skin to continue killing germs even after washing. Please DO NOT use if you have an allergy to CHG or antibacterial soaps.  If your skin becomes reddened/irritated stop using the CHG and inform your nurse when you arrive at Short Stay. Do not shave (including legs and underarms) for at least 48 hours prior to the first CHG shower.  You may shave your face/neck.  Please follow these instructions carefully:  1.  Shower with CHG Soap the night before surgery and the  morning of surgery.  2.  If you choose to wash your hair, wash your hair first as usual with your normal  shampoo.  3.  After you shampoo, rinse your hair and body thoroughly to remove the shampoo.                             4.  Use CHG as you would any other liquid soap.  You can apply chg directly to the skin and wash.  Gently with a scrungie or clean washcloth.  5.  Apply the CHG Soap to your body ONLY FROM THE NECK DOWN.   Do   not use on face/ open                           Wound or open sores. Avoid contact with eyes, ears  mouth and   genitals (private parts).                       Wash face,  Genitals (private parts) with your normal soap.             6.  Wash thoroughly, paying special attention to the area where your    surgery  will be performed.  7.  Thoroughly rinse your body with warm water from the neck down.  8.  DO NOT shower/wash with your normal soap after using and rinsing off the CHG Soap.                9.  Pat yourself dry with a clean towel.            10.  Wear clean pajamas.            11.  Place clean sheets on your bed the night of your first shower and do not  sleep with pets. Day of Surgery : Do not apply any lotions/deodorants the morning of surgery.  Please wear clean clothes to the hospital/surgery center.  FAILURE TO FOLLOW THESE INSTRUCTIONS MAY RESULT IN THE CANCELLATION OF YOUR SURGERY  PATIENT  SIGNATURE_________________________________  NURSE SIGNATURE__________________________________  ________________________________________________________________________  WHAT IS A BLOOD TRANSFUSION? Blood Transfusion Information  A transfusion is the replacement of blood or some of its parts. Blood is made up of multiple cells which provide different functions. Red blood cells carry oxygen and are used for blood loss replacement. White blood cells fight against infection. Platelets control bleeding. Plasma helps clot blood. Other blood products are available for specialized needs, such as hemophilia or other clotting disorders. BEFORE THE TRANSFUSION  Who gives blood for transfusions?  Healthy volunteers who are fully evaluated to make sure their blood is safe. This is blood bank blood. Transfusion therapy is the safest it has ever been in the practice of medicine. Before blood is taken from a donor, a complete history is taken to make sure that person has no history of diseases nor engages in risky social behavior (examples are intravenous drug use or sexual activity with multiple partners). The donor's travel history is screened to minimize risk of transmitting infections, such as malaria. The donated blood is tested for signs of infectious diseases, such as HIV and hepatitis. The blood is then tested to be sure it is compatible with you in order to minimize the chance of a transfusion reaction. If you or a relative donates blood, this is often done in anticipation of surgery and is not appropriate for emergency situations. It takes many days to process the donated blood. RISKS AND COMPLICATIONS Although transfusion therapy is very safe and saves many lives, the main dangers of transfusion include:  Getting an infectious disease. Developing a transfusion reaction. This is an allergic reaction to something in the blood you were given. Every precaution is taken to prevent this. The decision to  have a blood transfusion has been considered carefully by your caregiver before blood is given. Blood is not given unless the benefits outweigh the risks. AFTER THE TRANSFUSION Right after receiving a blood transfusion, you will usually feel much better and more energetic. This is especially true if your red blood cells have gotten low (anemic). The transfusion raises the level of the red blood cells which carry oxygen, and this usually causes an energy increase. The nurse administering the transfusion will monitor you carefully for complications. HOME CARE INSTRUCTIONS  No special instructions are needed after a transfusion. You may find your energy is better. Speak with your caregiver about any limitations on activity for underlying diseases you may have. SEEK MEDICAL CARE IF:  Your condition is not improving after your transfusion. You develop redness or irritation at the intravenous (IV) site. SEEK IMMEDIATE MEDICAL CARE IF:  Any of the following symptoms occur over the next 12 hours: Shaking chills. You have a temperature by mouth above 102 F (38.9 C), not controlled by medicine. Chest, back, or muscle pain. People around you feel you are not acting correctly or are confused. Shortness of breath or difficulty breathing. Dizziness and fainting. You get a rash or develop hives. You have a decrease in urine output. Your urine turns a dark color or changes to pink, red, or brown. Any of the following symptoms occur over the next 10 days: You have a temperature by mouth above 102 F (38.9 C), not controlled by medicine. Shortness of breath. Weakness after normal activity. The white part of the eye turns yellow (jaundice). You have a decrease in the amount of urine or are urinating less often. Your urine turns a dark color or changes to pink, red, or brown. Document Released: 06/08/2000 Document Revised: 09/03/2011 Document Reviewed: 01/26/2008 [facility] Patient Information 2014  [place], [place].  _______________________________________________________________________

## 2021-02-28 ENCOUNTER — Encounter (HOSPITAL_COMMUNITY): Payer: Self-pay

## 2021-02-28 ENCOUNTER — Other Ambulatory Visit: Payer: Self-pay | Admitting: Internal Medicine

## 2021-02-28 ENCOUNTER — Other Ambulatory Visit: Payer: Self-pay

## 2021-02-28 ENCOUNTER — Encounter (HOSPITAL_COMMUNITY)
Admission: RE | Admit: 2021-02-28 | Discharge: 2021-02-28 | Disposition: A | Discharge status: Home / Self Care | Payer: Medicare Other | Source: Ambulatory Visit | Attending: Urology | Admitting: Urology

## 2021-02-28 ENCOUNTER — Other Ambulatory Visit: Payer: Self-pay | Admitting: Urology

## 2021-02-28 DIAGNOSIS — Z01812 Encounter for preprocedural laboratory examination: Secondary | ICD-10-CM | POA: Insufficient documentation

## 2021-02-28 LAB — CBC
HCT: 41.5 % (ref 36.0–46.0)
Hemoglobin: 13.6 g/dL (ref 12.0–15.0)
MCH: 31.8 pg (ref 26.0–34.0)
MCHC: 32.8 g/dL (ref 30.0–36.0)
MCV: 97 fL (ref 80.0–100.0)
Platelets: 296 10*3/uL (ref 150–400)
RBC: 4.28 MIL/uL (ref 3.87–5.11)
RDW: 13.6 % (ref 11.5–15.5)
WBC: 6.8 10*3/uL (ref 4.0–10.5)
nRBC: 0 % (ref 0.0–0.2)

## 2021-02-28 LAB — BASIC METABOLIC PANEL
Anion gap: 9 (ref 5–15)
BUN: 22 mg/dL (ref 8–23)
CO2: 24 mmol/L (ref 22–32)
Calcium: 9.5 mg/dL (ref 8.9–10.3)
Chloride: 104 mmol/L (ref 98–111)
Creatinine, Ser: 0.96 mg/dL (ref 0.44–1.00)
GFR, Estimated: >60 mL/min (ref >60)
Glucose, Bld: 96 mg/dL (ref 70–99)
Potassium: 4.1 mmol/L (ref 3.5–5.1)
Sodium: 137 mmol/L (ref 135–145)

## 2021-03-03 DIAGNOSIS — G4733 Obstructive sleep apnea (adult) (pediatric): Secondary | ICD-10-CM | POA: Diagnosis not present

## 2021-03-07 ENCOUNTER — Other Ambulatory Visit: Payer: Self-pay | Admitting: Urology

## 2021-03-07 LAB — SARS CORONAVIRUS 2 (TAT 6-24 HRS): SARS Coronavirus 2: NEGATIVE

## 2021-03-09 ENCOUNTER — Other Ambulatory Visit (HOSPITAL_COMMUNITY): Payer: Self-pay

## 2021-03-09 ENCOUNTER — Encounter (HOSPITAL_COMMUNITY)
Admission: RE | Disposition: A | Discharge status: Home / Self Care | Payer: Self-pay | Source: Home / Self Care | Attending: Urology

## 2021-03-09 ENCOUNTER — Other Ambulatory Visit: Payer: Self-pay

## 2021-03-09 ENCOUNTER — Inpatient Hospital Stay (HOSPITAL_COMMUNITY): Payer: Medicare Other | Admitting: Certified Registered Nurse Anesthetist

## 2021-03-09 ENCOUNTER — Encounter (HOSPITAL_COMMUNITY): Payer: Self-pay | Admitting: Urology

## 2021-03-09 ENCOUNTER — Inpatient Hospital Stay (HOSPITAL_COMMUNITY): Payer: Medicare Other | Admitting: Physician Assistant

## 2021-03-09 ENCOUNTER — Inpatient Hospital Stay (HOSPITAL_COMMUNITY)
Admission: RE | Admit: 2021-03-09 | Discharge: 2021-03-12 | DRG: 658 | Disposition: A | Discharge status: Home / Self Care | Payer: Medicare Other | Attending: Urology | Admitting: Urology

## 2021-03-09 DIAGNOSIS — Z881 Allergy status to other antibiotic agents status: Secondary | ICD-10-CM

## 2021-03-09 DIAGNOSIS — E785 Hyperlipidemia, unspecified: Secondary | ICD-10-CM | POA: Diagnosis not present

## 2021-03-09 DIAGNOSIS — Z20822 Contact with and (suspected) exposure to covid-19: Secondary | ICD-10-CM | POA: Diagnosis not present

## 2021-03-09 DIAGNOSIS — Z90710 Acquired absence of both cervix and uterus: Secondary | ICD-10-CM

## 2021-03-09 DIAGNOSIS — Z833 Family history of diabetes mellitus: Secondary | ICD-10-CM | POA: Diagnosis not present

## 2021-03-09 DIAGNOSIS — I252 Old myocardial infarction: Secondary | ICD-10-CM | POA: Diagnosis not present

## 2021-03-09 DIAGNOSIS — Z888 Allergy status to other drugs, medicaments and biological substances status: Secondary | ICD-10-CM

## 2021-03-09 DIAGNOSIS — Z885 Allergy status to narcotic agent status: Secondary | ICD-10-CM

## 2021-03-09 DIAGNOSIS — Z96642 Presence of left artificial hip joint: Secondary | ICD-10-CM | POA: Diagnosis present

## 2021-03-09 DIAGNOSIS — Z79899 Other long term (current) drug therapy: Secondary | ICD-10-CM

## 2021-03-09 DIAGNOSIS — M797 Fibromyalgia: Secondary | ICD-10-CM | POA: Diagnosis not present

## 2021-03-09 DIAGNOSIS — F17210 Nicotine dependence, cigarettes, uncomplicated: Secondary | ICD-10-CM | POA: Diagnosis present

## 2021-03-09 DIAGNOSIS — I2510 Atherosclerotic heart disease of native coronary artery without angina pectoris: Secondary | ICD-10-CM | POA: Diagnosis not present

## 2021-03-09 DIAGNOSIS — C652 Malignant neoplasm of left renal pelvis: Secondary | ICD-10-CM | POA: Diagnosis not present

## 2021-03-09 DIAGNOSIS — Z8249 Family history of ischemic heart disease and other diseases of the circulatory system: Secondary | ICD-10-CM

## 2021-03-09 DIAGNOSIS — J449 Chronic obstructive pulmonary disease, unspecified: Secondary | ICD-10-CM | POA: Diagnosis not present

## 2021-03-09 DIAGNOSIS — N2889 Other specified disorders of kidney and ureter: Secondary | ICD-10-CM | POA: Diagnosis not present

## 2021-03-09 DIAGNOSIS — N289 Disorder of kidney and ureter, unspecified: Secondary | ICD-10-CM | POA: Diagnosis present

## 2021-03-09 DIAGNOSIS — N1832 Chronic kidney disease, stage 3b: Secondary | ICD-10-CM | POA: Diagnosis present

## 2021-03-09 DIAGNOSIS — I10 Essential (primary) hypertension: Secondary | ICD-10-CM | POA: Diagnosis not present

## 2021-03-09 DIAGNOSIS — G4730 Sleep apnea, unspecified: Secondary | ICD-10-CM | POA: Diagnosis present

## 2021-03-09 DIAGNOSIS — I129 Hypertensive chronic kidney disease with stage 1 through stage 4 chronic kidney disease, or unspecified chronic kidney disease: Secondary | ICD-10-CM | POA: Diagnosis present

## 2021-03-09 HISTORY — PX: ROBOT ASSITED LAPAROSCOPIC NEPHROURETERECTOMY: SHX6077

## 2021-03-09 LAB — TYPE AND SCREEN
ABO/RH(D): B NEG
Antibody Screen: NEGATIVE

## 2021-03-09 LAB — HEMOGLOBIN AND HEMATOCRIT, BLOOD
HCT: 43.4 % (ref 36.0–46.0)
Hemoglobin: 14.1 g/dL (ref 12.0–15.0)

## 2021-03-09 SURGERY — NEPHROURETERECTOMY, ROBOT-ASSISTED, LAPAROSCOPIC
Anesthesia: General | Laterality: Left

## 2021-03-09 MED ORDER — FENTANYL CITRATE (PF) 100 MCG/2ML IJ SOLN
INTRAMUSCULAR | Status: AC
  Filled 2021-03-09: qty 2

## 2021-03-09 MED ORDER — EPHEDRINE 5 MG/ML INJ
INTRAVENOUS | Status: AC
  Filled 2021-03-09: qty 5

## 2021-03-09 MED ORDER — ALBUTEROL SULFATE (2.5 MG/3ML) 0.083% IN NEBU
2.5000 mg | INHALATION_SOLUTION | Freq: Four times a day (QID) | RESPIRATORY_TRACT | Status: DC | PRN
  Administered 2021-03-11: 2.5 mg via RESPIRATORY_TRACT
  Filled 2021-03-09: qty 3

## 2021-03-09 MED ORDER — LIDOCAINE 2% (20 MG/ML) 5 ML SYRINGE
INTRAMUSCULAR | Status: DC | PRN
  Administered 2021-03-09: 40 mg via INTRAVENOUS

## 2021-03-09 MED ORDER — LACTATED RINGERS IV SOLN: INTRAVENOUS | Status: DC | PRN

## 2021-03-09 MED ORDER — PHENYLEPHRINE HCL (PRESSORS) 10 MG/ML IV SOLN
INTRAVENOUS | Status: AC
  Filled 2021-03-09: qty 2

## 2021-03-09 MED ORDER — ROCURONIUM BROMIDE 10 MG/ML (PF) SYRINGE
PREFILLED_SYRINGE | INTRAVENOUS | Status: AC
  Filled 2021-03-09: qty 10

## 2021-03-09 MED ORDER — ACETAMINOPHEN 325 MG PO TABS
650.0000 mg | ORAL_TABLET | ORAL | Status: DC | PRN
  Administered 2021-03-11 – 2021-03-12 (×3): 650 mg via ORAL
  Filled 2021-03-09 (×3): qty 2

## 2021-03-09 MED ORDER — PROPOFOL 10 MG/ML IV BOLUS
INTRAVENOUS | Status: DC | PRN
  Administered 2021-03-09: 120 mg via INTRAVENOUS

## 2021-03-09 MED ORDER — DEXTROSE-NACL 5-0.45 % IV SOLN: INTRAVENOUS | Status: DC

## 2021-03-09 MED ORDER — MIDAZOLAM HCL 2 MG/2ML IJ SOLN
INTRAMUSCULAR | Status: AC
  Filled 2021-03-09: qty 2

## 2021-03-09 MED ORDER — AMLODIPINE BESYLATE 5 MG PO TABS
5.0000 mg | ORAL_TABLET | Freq: Every day | ORAL | Status: DC
  Administered 2021-03-10 – 2021-03-12 (×3): 5 mg via ORAL
  Filled 2021-03-09 (×3): qty 1

## 2021-03-09 MED ORDER — METOPROLOL TARTRATE 25 MG PO TABS
25.0000 mg | ORAL_TABLET | Freq: Two times a day (BID) | ORAL | Status: DC
  Administered 2021-03-09 – 2021-03-12 (×6): 25 mg via ORAL
  Filled 2021-03-09 (×6): qty 1

## 2021-03-09 MED ORDER — PROPOFOL 10 MG/ML IV BOLUS
INTRAVENOUS | Status: AC
  Filled 2021-03-09: qty 20

## 2021-03-09 MED ORDER — NICOTINE 21 MG/24HR TD PT24
21.0000 mg | MEDICATED_PATCH | Freq: Every day | TRANSDERMAL | Status: DC
  Administered 2021-03-09 – 2021-03-12 (×4): 21 mg via TRANSDERMAL
  Filled 2021-03-09 (×4): qty 1

## 2021-03-09 MED ORDER — LIDOCAINE HCL (PF) 2 % IJ SOLN
INTRAMUSCULAR | Status: AC
  Filled 2021-03-09: qty 5

## 2021-03-09 MED ORDER — DIPHENHYDRAMINE HCL 12.5 MG/5ML PO ELIX: 12.5000 mg | ORAL_SOLUTION | Freq: Four times a day (QID) | ORAL | Status: DC | PRN

## 2021-03-09 MED ORDER — FENTANYL CITRATE (PF) 250 MCG/5ML IJ SOLN
INTRAMUSCULAR | Status: AC
  Filled 2021-03-09: qty 5

## 2021-03-09 MED ORDER — ONDANSETRON HCL 4 MG/2ML IJ SOLN
INTRAMUSCULAR | Status: DC | PRN
  Administered 2021-03-09: 4 mg via INTRAVENOUS

## 2021-03-09 MED ORDER — ONDANSETRON HCL 4 MG/2ML IJ SOLN
4.0000 mg | INTRAMUSCULAR | Status: DC | PRN
  Administered 2021-03-10: 4 mg via INTRAVENOUS
  Filled 2021-03-09: qty 2

## 2021-03-09 MED ORDER — NITROGLYCERIN 0.4 MG SL SUBL: 0.4000 mg | SUBLINGUAL_TABLET | SUBLINGUAL | Status: DC | PRN

## 2021-03-09 MED ORDER — LACTATED RINGERS IR SOLN
Status: DC | PRN
  Administered 2021-03-09: 1000 mL

## 2021-03-09 MED ORDER — GLYCOPYRROLATE 0.2 MG/ML IJ SOLN
INTRAMUSCULAR | Status: DC | PRN
  Administered 2021-03-09: .2 mg via INTRAVENOUS

## 2021-03-09 MED ORDER — CHLORHEXIDINE GLUCONATE 0.12 % MT SOLN
15.0000 mL | Freq: Once | OROMUCOSAL | Status: AC
  Administered 2021-03-09: 15 mL via OROMUCOSAL

## 2021-03-09 MED ORDER — PROPOFOL 500 MG/50ML IV EMUL
INTRAVENOUS | Status: DC | PRN
Start: 2021-03-09 — End: 2021-03-09
  Administered 2021-03-09: 50 ug/kg/min via INTRAVENOUS

## 2021-03-09 MED ORDER — SODIUM CHLORIDE (PF) 0.9 % IJ SOLN
INTRAMUSCULAR | Status: DC | PRN
  Administered 2021-03-09: 20 mL

## 2021-03-09 MED ORDER — MONTELUKAST SODIUM 10 MG PO TABS
10.0000 mg | ORAL_TABLET | Freq: Every day | ORAL | Status: DC
  Administered 2021-03-09 – 2021-03-11 (×3): 10 mg via ORAL
  Filled 2021-03-09 (×3): qty 1

## 2021-03-09 MED ORDER — SODIUM CHLORIDE (PF) 0.9 % IJ SOLN
INTRAMUSCULAR | Status: AC
  Filled 2021-03-09: qty 20

## 2021-03-09 MED ORDER — DOCUSATE SODIUM 100 MG PO CAPS
100.0000 mg | ORAL_CAPSULE | Freq: Two times a day (BID) | ORAL | Status: DC
Start: 2021-03-09 — End: 2021-03-12
  Administered 2021-03-09 – 2021-03-12 (×5): 100 mg via ORAL
  Filled 2021-03-09 (×6): qty 1

## 2021-03-09 MED ORDER — FENTANYL CITRATE (PF) 100 MCG/2ML IJ SOLN
INTRAMUSCULAR | Status: DC | PRN
  Administered 2021-03-09: 50 ug via INTRAVENOUS
  Administered 2021-03-09 (×2): 25 ug via INTRAVENOUS
  Administered 2021-03-09 (×3): 50 ug via INTRAVENOUS
  Administered 2021-03-09: 100 ug via INTRAVENOUS

## 2021-03-09 MED ORDER — ONDANSETRON HCL 4 MG/2ML IJ SOLN: 4.0000 mg | Freq: Once | INTRAMUSCULAR | Status: DC | PRN

## 2021-03-09 MED ORDER — GLYCOPYRROLATE 0.2 MG/ML IJ SOLN
INTRAMUSCULAR | Status: AC
  Filled 2021-03-09: qty 1

## 2021-03-09 MED ORDER — EPHEDRINE SULFATE-NACL 50-0.9 MG/10ML-% IV SOSY
PREFILLED_SYRINGE | INTRAVENOUS | Status: DC | PRN
  Administered 2021-03-09 (×4): 5 mg via INTRAVENOUS
  Administered 2021-03-09: 10 mg via INTRAVENOUS

## 2021-03-09 MED ORDER — ROCURONIUM BROMIDE 10 MG/ML (PF) SYRINGE
PREFILLED_SYRINGE | INTRAVENOUS | Status: DC | PRN
  Administered 2021-03-09: 60 mg via INTRAVENOUS
  Administered 2021-03-09: 40 mg via INTRAVENOUS
  Administered 2021-03-09: 20 mg via INTRAVENOUS

## 2021-03-09 MED ORDER — STERILE WATER FOR IRRIGATION IR SOLN
Status: DC | PRN
  Administered 2021-03-09: 1000 mL

## 2021-03-09 MED ORDER — TRAMADOL HCL 50 MG PO TABS
50.0000 mg | ORAL_TABLET | Freq: Four times a day (QID) | ORAL | Status: DC | PRN
  Administered 2021-03-10: 100 mg via ORAL
  Administered 2021-03-10: 50 mg via ORAL
  Administered 2021-03-10: 100 mg via ORAL
  Administered 2021-03-11 – 2021-03-12 (×3): 50 mg via ORAL
  Filled 2021-03-09 (×4): qty 1
  Filled 2021-03-09 (×2): qty 2

## 2021-03-09 MED ORDER — BELLADONNA ALKALOIDS-OPIUM 16.2-60 MG RE SUPP: 1.0000 | Freq: Four times a day (QID) | RECTAL | Status: DC | PRN

## 2021-03-09 MED ORDER — BUPIVACAINE LIPOSOME 1.3 % IJ SUSP
INTRAMUSCULAR | Status: AC
  Filled 2021-03-09: qty 20

## 2021-03-09 MED ORDER — ONDANSETRON HCL 4 MG/2ML IJ SOLN
INTRAMUSCULAR | Status: AC
  Filled 2021-03-09: qty 2

## 2021-03-09 MED ORDER — PHENYLEPHRINE 40 MCG/ML (10ML) SYRINGE FOR IV PUSH (FOR BLOOD PRESSURE SUPPORT)
PREFILLED_SYRINGE | INTRAVENOUS | Status: DC | PRN
  Administered 2021-03-09 (×2): 80 ug via INTRAVENOUS

## 2021-03-09 MED ORDER — PHENYLEPHRINE 40 MCG/ML (10ML) SYRINGE FOR IV PUSH (FOR BLOOD PRESSURE SUPPORT)
PREFILLED_SYRINGE | INTRAVENOUS | Status: AC
  Filled 2021-03-09: qty 10

## 2021-03-09 MED ORDER — HYDROMORPHONE HCL 1 MG/ML IJ SOLN
0.5000 mg | INTRAMUSCULAR | Status: DC | PRN
  Administered 2021-03-09 – 2021-03-10 (×2): 0.5 mg via INTRAVENOUS
  Filled 2021-03-09 (×2): qty 1

## 2021-03-09 MED ORDER — DEXAMETHASONE SODIUM PHOSPHATE 10 MG/ML IJ SOLN
INTRAMUSCULAR | Status: AC
  Filled 2021-03-09: qty 1

## 2021-03-09 MED ORDER — LACTATED RINGERS IV SOLN: INTRAVENOUS | Status: DC

## 2021-03-09 MED ORDER — DEXAMETHASONE SODIUM PHOSPHATE 4 MG/ML IJ SOLN
INTRAMUSCULAR | Status: DC | PRN
  Administered 2021-03-09: 5 mg via INTRAVENOUS

## 2021-03-09 MED ORDER — LORAZEPAM 0.5 MG PO TABS
0.5000 mg | ORAL_TABLET | Freq: Every day | ORAL | Status: DC | PRN
  Administered 2021-03-11: 0.5 mg via ORAL
  Filled 2021-03-09: qty 1

## 2021-03-09 MED ORDER — CEFAZOLIN SODIUM-DEXTROSE 2-4 GM/100ML-% IV SOLN
2.0000 g | INTRAVENOUS | Status: AC
  Administered 2021-03-09: 2 g via INTRAVENOUS
  Filled 2021-03-09: qty 100

## 2021-03-09 MED ORDER — CEFAZOLIN SODIUM-DEXTROSE 1-4 GM/50ML-% IV SOLN
1.0000 g | Freq: Three times a day (TID) | INTRAVENOUS | Status: AC
  Administered 2021-03-09 – 2021-03-10 (×2): 1 g via INTRAVENOUS
  Filled 2021-03-09 (×2): qty 50

## 2021-03-09 MED ORDER — DIPHENHYDRAMINE HCL 50 MG/ML IJ SOLN: 12.5000 mg | Freq: Four times a day (QID) | INTRAMUSCULAR | Status: DC | PRN

## 2021-03-09 MED ORDER — TRAMADOL HCL 50 MG PO TABS
50.0000 mg | ORAL_TABLET | Freq: Four times a day (QID) | ORAL | 0 refills | Status: DC | PRN
  Filled 2021-03-09: qty 20, 3d supply, fill #0

## 2021-03-09 MED ORDER — CHLORHEXIDINE GLUCONATE CLOTH 2 % EX PADS
6.0000 | MEDICATED_PAD | Freq: Every day | CUTANEOUS | Status: DC
  Administered 2021-03-10: 6 via TOPICAL

## 2021-03-09 MED ORDER — SODIUM CHLORIDE 0.9 % IR SOLN
Status: DC | PRN
  Administered 2021-03-09: 3000 mL

## 2021-03-09 MED ORDER — ORAL CARE MOUTH RINSE: 15.0000 mL | Freq: Once | OROMUCOSAL | Status: AC

## 2021-03-09 MED ORDER — SUGAMMADEX SODIUM 200 MG/2ML IV SOLN
INTRAVENOUS | Status: DC | PRN
Start: 2021-03-09 — End: 2021-03-09
  Administered 2021-03-09: 300 mg via INTRAVENOUS

## 2021-03-09 MED ORDER — BUPIVACAINE LIPOSOME 1.3 % IJ SUSP
INTRAMUSCULAR | Status: DC | PRN
  Administered 2021-03-09: 20 mL

## 2021-03-09 MED ORDER — DOCUSATE SODIUM 100 MG PO CAPS: 100.0000 mg | ORAL_CAPSULE | Freq: Two times a day (BID) | ORAL | Status: DC

## 2021-03-09 MED ORDER — PROMETHAZINE HCL 12.5 MG PO TABS
12.5000 mg | ORAL_TABLET | ORAL | 0 refills | Status: DC | PRN
  Filled 2021-03-09: qty 15, 3d supply, fill #0

## 2021-03-09 MED ORDER — MIDAZOLAM HCL 5 MG/5ML IJ SOLN
INTRAMUSCULAR | Status: DC | PRN
  Administered 2021-03-09: 2 mg via INTRAVENOUS

## 2021-03-09 MED ORDER — HYDROMORPHONE HCL 1 MG/ML IJ SOLN
0.2500 mg | INTRAMUSCULAR | Status: DC | PRN
  Administered 2021-03-09 (×3): 0.5 mg via INTRAVENOUS

## 2021-03-09 MED ORDER — HYDROMORPHONE HCL 1 MG/ML IJ SOLN
INTRAMUSCULAR | Status: AC
  Filled 2021-03-09: qty 2

## 2021-03-09 MED ORDER — ATORVASTATIN CALCIUM 20 MG PO TABS
20.0000 mg | ORAL_TABLET | Freq: Every day | ORAL | Status: DC
  Administered 2021-03-09 – 2021-03-11 (×3): 20 mg via ORAL
  Filled 2021-03-09 (×3): qty 1

## 2021-03-09 SURGICAL SUPPLY — 63 items
BAG COUNTER SPONGE SURGICOUNT (BAG) IMPLANT
BAG LAPAROSCOPIC 12 15 PORT 16 (BASKET) ×1 IMPLANT
BAG RETRIEVAL 12/15 (BASKET) ×2
CATH 18FR 3 WAY 30 (CATHETERS) ×2
CATH 18FR 3WAY 30CC (CATHETERS) ×1 IMPLANT
CATH URET FLEX-TIP 2 LUMEN 10F (CATHETERS) ×2 IMPLANT
CHLORAPREP W/TINT 26 (MISCELLANEOUS) ×2 IMPLANT
CLIP LIGATING HEMO LOK XL GOLD (MISCELLANEOUS) ×2 IMPLANT
CLIP LIGATING HEMO O LOK GREEN (MISCELLANEOUS) ×4 IMPLANT
COVER SURGICAL LIGHT HANDLE (MISCELLANEOUS) ×2 IMPLANT
COVER TIP SHEARS 8 DVNC (MISCELLANEOUS) ×1 IMPLANT
COVER TIP SHEARS 8MM DA VINCI (MISCELLANEOUS) ×2
CUTTER ECHEON FLEX ENDO 45 340 (ENDOMECHANICALS) ×2 IMPLANT
DECANTER SPIKE VIAL GLASS SM (MISCELLANEOUS) ×2 IMPLANT
DERMABOND ADVANCED (GAUZE/BANDAGES/DRESSINGS) ×1
DERMABOND ADVANCED .7 DNX12 (GAUZE/BANDAGES/DRESSINGS) ×1 IMPLANT
DRAIN CHANNEL 15F RND FF 3/16 (WOUND CARE) ×2 IMPLANT
DRAPE ARM DVNC X/XI (DISPOSABLE) ×4 IMPLANT
DRAPE COLUMN DVNC XI (DISPOSABLE) ×1 IMPLANT
DRAPE DA VINCI XI ARM (DISPOSABLE) ×8
DRAPE DA VINCI XI COLUMN (DISPOSABLE) ×2
DRAPE INCISE IOBAN 66X45 STRL (DRAPES) ×2 IMPLANT
DRAPE LAPAROSCOPIC ABDOMINAL (DRAPES) ×2 IMPLANT
DRAPE SHEET LG 3/4 BI-LAMINATE (DRAPES) ×2 IMPLANT
DRSG TEGADERM 4X4.75 (GAUZE/BANDAGES/DRESSINGS) ×2 IMPLANT
ELECT REM PT RETURN 15FT ADLT (MISCELLANEOUS) ×2 IMPLANT
EVACUATOR SILICONE 100CC (DRAIN) ×2 IMPLANT
GLOVE SURG ENC MOIS LTX SZ6.5 (GLOVE) ×2 IMPLANT
GLOVE SURG ENC TEXT LTX SZ7.5 (GLOVE) ×4 IMPLANT
GOWN STRL REUS W/TWL LRG LVL3 (GOWN DISPOSABLE) ×6 IMPLANT
GUIDEWIRE ANG ZIPWIRE 038X150 (WIRE) ×2 IMPLANT
IRRIG SUCT STRYKERFLOW 2 WTIP (MISCELLANEOUS)
IRRIGATION SUCT STRKRFLW 2 WTP (MISCELLANEOUS) IMPLANT
KIT BASIN OR (CUSTOM PROCEDURE TRAY) ×2 IMPLANT
KIT TURNOVER KIT A (KITS) ×2 IMPLANT
NEEDLE INSUFFLATION 14GA 120MM (NEEDLE) ×2 IMPLANT
NS IRRIG 1000ML POUR BTL (IV SOLUTION) ×2 IMPLANT
PROTECTOR NERVE ULNAR (MISCELLANEOUS) ×4 IMPLANT
SCISSORS LAP 5X45 EPIX DISP (ENDOMECHANICALS) ×2 IMPLANT
SEAL CANN UNIV 5-8 DVNC XI (MISCELLANEOUS) ×4 IMPLANT
SEAL XI 5MM-8MM UNIVERSAL (MISCELLANEOUS) ×8
SET TUBE SMOKE EVAC HIGH FLOW (TUBING) ×2 IMPLANT
SOLUTION ELECTROLUBE (MISCELLANEOUS) ×2 IMPLANT
STAPLE RELOAD 45 WHT (STAPLE) ×2 IMPLANT
STAPLE RELOAD 45MM WHITE (STAPLE) ×4
STENT URET 6FRX24 CONTOUR (STENTS) ×2 IMPLANT
SUT ETHILON 3 0 PS 1 (SUTURE) ×2 IMPLANT
SUT MNCRL AB 4-0 PS2 18 (SUTURE) ×4 IMPLANT
SUT PDS AB 1 CTX 36 (SUTURE) ×4 IMPLANT
SUT V-LOC BARB 180 2/0GR6 GS22 (SUTURE)
SUT VIC AB 2-0 CT2 27 (SUTURE) ×2 IMPLANT
SUT VICRYL 0 UR6 27IN ABS (SUTURE) ×4 IMPLANT
SUT VLOC BARB 180 ABS3/0GR12 (SUTURE) ×2
SUTURE V-LC BRB 180 2/0GR6GS22 (SUTURE) IMPLANT
SUTURE VLOC BRB 180 ABS3/0GR12 (SUTURE) ×1 IMPLANT
SYR 30ML LL (SYRINGE) ×2 IMPLANT
TOWEL OR NON WOVEN STRL DISP B (DISPOSABLE) ×2 IMPLANT
TRAY FOLEY MTR SLVR 16FR STAT (SET/KITS/TRAYS/PACK) ×2 IMPLANT
TRAY LAPAROSCOPIC (CUSTOM PROCEDURE TRAY) ×2 IMPLANT
TROCAR BLADELESS OPT 5 100 (ENDOMECHANICALS) ×2 IMPLANT
TROCAR UNIVERSAL OPT 12M 100M (ENDOMECHANICALS) IMPLANT
TROCAR XCEL 12X100 BLDLESS (ENDOMECHANICALS) ×2 IMPLANT
WATER STERILE IRR 1000ML POUR (IV SOLUTION) ×4 IMPLANT

## 2021-03-09 NOTE — Op Note (Signed)
Operative Note  Preoperative diagnosis:  1.  Left renal pelvis mass  Postoperative diagnosis: 1.  Left renal pelvis mass with features consistent with urothelial cell carcinoma  Procedure(s): 1.  Cystoscopy with left ureteroscopy, left ureteral stent placement and TUR of the left ureteral orifice 2.  Robot-assisted laparoscopic left nephro ureterectomy  Surgeon: Ellison Hughs, MD  Assistants: Debbrah Alar, PA-C  An assistant was required for this surgical procedure.  The duties of the assistant included but were not limited to suctioning, passing suture, camera manipulation, retraction.  This procedure would not be able to be performed without an Environmental consultant.   Anesthesia:  General  Complications:  None  EBL: 50 mL  Specimens: 1.  Left kidney and ureter  Drains/Catheters: 1.  18 French three-way Foley catheter with 10 mL of sterile water in the balloon 2.  Left lower quadrant JP drain  Intraoperative findings:   Left ureteroscopy revealed a large papillary mass extensively involving the left renal pelvis with features consistent with urothelial cell carcinoma  Indication:  Morgan Torres is a 70 y.o. female with a solid and enhancing left renal pelvis mass with features concerning for urothelial cell carcinoma.  She has been consented for the above procedures, voices understanding and wishes to proceed.  Description of procedure:  After informed consent was obtained, the patient was brought to the operating room and general endotracheal anesthesia was administered. The patient was then placed in the dorsolithotomy position and prepped and draped in the usual sterile fashion. A timeout was performed. A 23 French rigid cystoscope was then inserted into the urethral meatus and advanced into the bladder under direct vision. A complete bladder survey revealed no intravesical pathology.  A Glidewire was then advanced up the left ureter to the left renal pelvis.  A flexible  ureteroscope was then advanced over the wire and up to the left renal pelvis, under direct vision.  A complete survey of the left renal pelvis revealed extensive papillary mass involving the upper pole calyces with features concerning for urothelial cell carcinoma.  There were no lesions seen within the lumen of the left ureter.  The flexible ureteroscope was then removed, leaving the wire in place.  A 6 French, 24 cm ureteral stent was then advanced over the wire and into position within the left collecting system.  A 26 French resectoscope with a hot Collins knife was then inserted into the urethra and advanced into the bladder.  The left ureteral orifice was then circumferentially resected down to the perivesical fat.  The area of resection was extensively fulgurated until hemostasis was achieved.  The resectoscope was then removed and an 45 French three-way Foley catheter was then inserted with return of pink urine.  The Foley catheter balloon was inflated with 10 mL of sterile water and placed to gravity drainage.  The patient was then placed in the right lateral decubitus position and prepped and draped in usual sterile fashion.  A timeout was performed.  An 8 mm incision was then made lateral to the left rectus muscle at the level of the left 12th rib.  Abdominal access was obtained via a Veress needle.  The abdominal cavity was then insufflated up to 15 mmHg.  An 8 mm port was then introduced into the abdominal cavity.  Inspection of the port entry site by the robotic camera revealed no adjacent organ injury.  We then placed 3 additional 8 mm robotic ports to triangulate the left renal hilum.  A 12 mm  assistant port was then placed between the carmera port and 3rd robotic arm.  The white line of [place]oldt along the descending colon was incised sharply and the colon, along with its mesocolonic fat, was reflected medially until the aorta was identified.  We then made a small window adjacent to the lower  pole of the left kidney, identifying the left psoas muscle, left ureter and left gonadal vein.  The left ureter and gonadal vein were then reflected anteriorly allowing [place] to then incised the perihilar attachments using electrocautery until the left renal artery and vein were clearly visualized.    A 45 mm endovascular stapler was then used to ligate the left renal artery and then the left renal vein, achieving excellent hemostasis.  The remaining peri-renal attachments were then excised using a combination of blunt dissection and electrocautery.  The left adrenal gland was spared.    The left ureter was then traced down to the pelvis into the bladder hiatus where it was removed en bloc.  A 3-0 V-Loc suture was then used to reapproximate the bladder aperture created by removing the left ureter.  Approximately 300 mL of saline was then instilled into the three-way Foley catheter there was no evidence of a leak from the bladder hiatus.  The remaining perinephric attachments were then incised using electrocautery.  Once the kidney was freely mobile, it was placed in Endo Catch bag to be be retrieved at the conclusion of the case.  A JP drain was then inserted into the most lateral robotic port and into the dependent portion of the pelvis.  JP drain was secured in place using a nylon suture and placed to bulb suction.  The robot was then de-docked.  A left lower quadrant Gibson incision was then made and the mass was removed within the Endo Catch bag.  The fascia within the midline assistant port was then closed using an interrupted 0 Vicryl suture.  The fascia of the internal and external oblique was then closed using a 0 PDS suture in a running fashion.  The subcutaneous tissue within the [facility] incision was then closed using a running 0 Vicryl suture.  All skin incisions were then closed using 4-0 Monocryl and then dressed with Dermabond.  The patient tolerated the procedure well and was transferred to the  postanesthesia in stable condition.    Plan: Monitor on the floor overnight

## 2021-03-09 NOTE — H&P (Signed)
Urology Preoperative H&P   Chief Complaint:  Left renal pelvis mass  History of Present Illness: Morgan Torres is a 70 y.o. female referred by Dr. Louis Meckel, with a solid and enhancing left renal lesion involving the upper pole and extending into the renal pelvis. The lesion was initially discovered on CT wo/w on 01/03/21 during an evaluation for gross hematuria.   -Long time 1 ppd smoker  -Hx of open small bowel resection x2 due to SBO  -Hx of CAD, but has never required coronary cath or CABG  -Hx of COPD--sporadically uses a CPAP  -Gross hematuria has resolved   Past Medical History:  Diagnosis Date   Anxiety    Arthritis    hip, lumbar spine    Asthma    seasonal allergies    Bronchitis    Hx: of   Chronic kidney disease    COPD (chronic obstructive pulmonary disease) ([place])    Coronary artery disease    Depression    Fibromyalgia    Hypertension    Lumbar herniated disc    Myocardial infarction ([place]) 06/25/2012   followed by Dr. Debara Pickett, treated medically, no stents   Pneumonia    Hx: of   Sciatica    Sleep apnea    Small bowel obstruction ([place])     Past Surgical History:  Procedure Laterality Date   ABDOMINAL HYSTERECTOMY     ABDOMINAL SURGERY     resection of small intestine   BACK SURGERY     fusion   CARDIAC CATHETERIZATION  2014   COLON SURGERY     resection for bowel - for obstruction    COLONOSCOPY     Hx; of   DILATION AND CURETTAGE OF UTERUS     HERNIA REPAIR  1970's   inguinal hernia    LEFT HEART CATHETERIZATION WITH CORONARY ANGIOGRAM N/A 03/30/2013   Procedure: LEFT HEART CATHETERIZATION WITH CORONARY ANGIOGRAM;  Surgeon: Leonie Man, MD;  Location: [facility] CATH LAB;  Service: Cardiovascular;  Laterality: N/A;   SALIVARY GLAND SURGERY Left 1970-1980   approached from inside mouth & side of neck   TONSILLECTOMY     TOTAL HIP ARTHROPLASTY Left 05/28/2014   Procedure: LEFT TOTAL HIP ARTHROPLASTY;  Surgeon: Yvette Rack., MD;  Location: [place];   Service: Orthopedics;  Laterality: Left;   TOTAL HIP ARTHROPLASTY Left 05/28/2014   dr caffrey    Allergies:  Allergies  Allergen Reactions   Diclofenac Anaphylaxis   Codeine Itching   Hydrocodone Itching   Ciprofloxacin Rash    Family History  Problem Relation Age of Onset   Hypertension Mother    Diabetes Mother    Cancer - Other Mother    Cancer Sister     Social History:  reports that she has been smoking cigarettes. She has a 10.00 pack-year smoking history. She has never used smokeless tobacco. She reports current alcohol use. She reports that she does not use drugs.  ROS: A complete review of systems was performed.  All systems are negative except for pertinent findings as noted.  Physical Exam:  Vital signs in last 24 hours: Temp:  [98 F (36.7 C)] 98 F (36.7 C) (09/15 1054) Pulse Rate:  [59] 59 (09/15 1054) Resp:  [18] 18 (09/15 1054) BP: (113)/(66) 113/66 (09/15 1054) SpO2:  [99 %] 99 % (09/15 1054) Weight:  [97.5 kg] 97.5 kg (09/15 1044) Constitutional:  Alert and oriented, No acute distress Cardiovascular: Regular rate and rhythm, No JVD Respiratory:  Normal respiratory effort, Lungs clear bilaterally GI: Abdomen is soft, nontender, nondistended, no abdominal masses GU: No CVA tenderness Lymphatic: No lymphadenopathy Neurologic: Grossly intact, no focal deficits Psychiatric: Normal mood and affect  Laboratory Data:  No results for input(s): WBC, HGB, HCT, PLT in the last 72 hours.  No results for input(s): NA, K, CL, GLUCOSE, BUN, CALCIUM, CREATININE in the last 72 hours.  Invalid input(s): CO3   No results found for this or any previous visit (from the past 24 hour(s)). Recent Results (from the past 240 hour(s))  SARS Coronavirus 2 (TAT 6-24 hrs)     Status: None   Collection Time: 03/07/21 12:00 AM  Result Value Ref Range Status   SARS Coronavirus 2 RESULT: NEGATIVE  Final    Comment: RESULT: NEGATIVESARS-CoV-2 INTERPRETATION:A NEGATIVE  test  result means that SARS-CoV-2 RNA was not present in the specimen above the limit of detection of this test. This does not preclude a possible SARS-CoV-2 infection and should not be used as the  sole basis for patient management decisions. Negative results must be combined with clinical observations, patient history, and epidemiological information. Optimum specimen types and timing for peak viral levels during infections caused by SARS-CoV-2  have not been determined. Collection of multiple specimens or types of specimens may be necessary to detect virus. Improper specimen collection and handling, sequence variability under primers/probes, or organism present below the limit of detection may  lead to false negative results. Positive and negative predictive values of testing are highly dependent on prevalence. False negative test results are more likely when prevalence of disease is high.The expected result is NEGATIVE.Fact S heet for  Healthcare Providers: LocalChronicle.no Sheet for Patients: SalonLookup.es Reference Range - Negative     Renal Function: No results for input(s): CREATININE in the last 168 hours. Estimated Creatinine Clearance: 65.6 mL/min (by C-G formula based on SCr of 0.96 mg/dL).  Radiologic Imaging: CLINICAL DATA: Gross hematuria.   EXAM:  CT ABDOMEN AND PELVIS WITHOUT AND WITH CONTRAST   TECHNIQUE:  Multidetector CT imaging of the abdomen and pelvis was performed  following the standard protocol before and following the bolus  administration of intravenous contrast.   CONTRAST: 198m OMNIPAQUE IOHEXOL 300 MG/ML SOLN   COMPARISON: Ultrasound exam from 12/13/2020. CT scan 07/13/2010.   FINDINGS:  Lower chest: Unremarkable.   Hepatobiliary: Scattered tiny hypodensities measuring up to 7 mm,  too small to characterize but likely benign. 10 mm low-density  lesion identified in the subcapsular right liver  adjacent to the  kidney on precontrast imaging shows peripheral nodular enhancement  (image 28/series 7) on postcontrast imaging compatible with  hemangioma. There is no evidence for gallstones, gallbladder wall  thickening, or pericholecystic fluid. No intrahepatic or  extrahepatic biliary dilation.   Pancreas: No focal mass lesion. No dilatation of the main duct. No  intraparenchymal cyst. No peripancreatic edema.   Spleen: No splenomegaly. No focal mass lesion.   Adrenals/Urinary Tract: No adrenal nodule or mass.   Precontrast imaging shows high attenuation urine in the intrarenal  collecting system and renal pelvis of both kidneys as well as in the  ureters bilaterally. This probably represents in invert and early  administration of intravenous contrast material during IV  placement.. This mildly limits the assessment for stone disease, but  no stones are noted in either kidney or ureter. Bladder is partially  obscured by beam hardening artifact from left hip replacement  without evidence of stone disease.   There is some cortical  scarring in the right kidney noted after  contrast administration. Right kidney otherwise unremarkable.   3.6 x 3.6 x 3.6 cm somewhat lobular relatively homogeneous lesion is  identified in the upper pole left kidney and appears to extend into  the collecting system and then out into the renal pelvis to the  level of the UPJ. This is well demonstrated on delayed postcontrast  imaging (axial 41/13 and coronal 60/16.   Right ureter is well opacified and unremarkable. Left ureter  incompletely opacified but no evidence for focal hydroureter or mass  lesion below the proximal ureter. Bladder is partially obscured by  beam hardening artifact but no focal bladder wall thickening noted  within the visible portions.   Stomach/Bowel: Stomach is nondistended. Duodenum is normally  positioned as is the ligament of Treitz. No small bowel wall  thickening. No  small bowel dilatation. The terminal ileum is normal.  The appendix is not well visualized, but there is no edema or  inflammation in the region of the cecum. No gross colonic mass. No  colonic wall thickening.   Vascular/Lymphatic: There is moderate atherosclerotic calcification  of the abdominal aorta without aneurysm. Non opacification of the  right common iliac arteries suggest occlusion. There is no  gastrohepatic or hepatoduodenal ligament lymphadenopathy. No  retroperitoneal or mesenteric lymphadenopathy. No pelvic sidewall  lymphadenopathy.   Reproductive: The uterus is surgically absent. There is no adnexal  mass.   Other: No intraperitoneal free fluid.   Musculoskeletal: Status post left total hip replacement. Lumbosacral  fusion hardware evident. No worrisome lytic or sclerotic osseous  abnormality.   IMPRESSION:  1. 3.6 x 3.6 x 3.6 cm somewhat lobular relatively homogeneous lesion  in the upper pole left kidney extends into the collecting system and  then out into the renal pelvis to the level of possibly involving  the UPJ. This tissue in the collecting system and renal pelvis  appears to enhance after IV contrast administration suggesting tumor  related soft tissue. Imaging features compatible with transitional  cell carcinoma. No evidence for metastatic disease in the abdomen or  pelvis.  2. 10 mm subcapsular right liver lesion with peripheral nodular  enhancement, compatible with hemangioma.  3. Non opacification of the right common iliac artery suggests  occlusion.  4. Scattered tiny hypodensities in the liver measuring up to 7 mm,  too small to characterize but likely benign.  5. Aortic Atherosclerosis (ICD10-I70.0).    Electronically Signed  By: Misty Stanley M.D.  On: 01/03/2021 16:14     I independently reviewed the above imaging studies.  Assessment and Plan THALIYAH MIDDLEMAN is a 70 y.o. female with a solid and enhancing mass in the left renal pelvis  with features concerning for urothelial cell carcinoma  -Pathology and imaging results discussed with the patient, which are consistent with an upper tract urothelial cell carcinoma. The risks, benefits and alternatives of cystoscopy with, LEFT ureteroscopy, TUR of the LEFT ureteral orifice and robot-assisted laparoscopic LEFT nephroureterectomy were discussed in detail including but not limited to: negative pathology, open conversion, infection of the skin/abdominal cavity, VTE, MI/CVA, lymphatic leak, injury to adjacent solid/hollow viscus organs, bleeding requiring a blood transfusion, catastrophic bleeding, bladder perforation hernia formation and other imponderables. The patient voices understanding and wishes to proceed.      Ellison Hughs, MD 03/09/2021, 11:48 AM  [facility] Pager: [phone]

## 2021-03-09 NOTE — Anesthesia Procedure Notes (Signed)
Procedure Name: Intubation Date/Time: 03/09/2021 12:40 PM Performed by: Claudia Desanctis, CRNA Pre-anesthesia Checklist: Patient identified, Emergency Drugs available, Suction available and Patient being monitored Patient Re-evaluated:Patient Re-evaluated prior to induction Oxygen Delivery Method: Circle system utilized Preoxygenation: Pre-oxygenation with 100% oxygen Induction Type: IV induction Ventilation: Mask ventilation without difficulty Laryngoscope Size: 2 and Miller Grade View: Grade I Tube type: Oral Tube size: 7.0 mm Number of attempts: 1 Airway Equipment and Method: Stylet Placement Confirmation: ETT inserted through vocal cords under direct vision, positive ETCO2 and breath sounds checked- equal and bilateral Secured at: 22 cm Tube secured with: Tape Dental Injury: Teeth and Oropharynx as per pre-operative assessment

## 2021-03-09 NOTE — Transfer of Care (Signed)
Immediate Anesthesia Transfer of Care Note  Patient: Morgan Torres  Procedure(s) Performed: XI ROBOT ASSITED LAPAROSCOPIC NEPHROURETERECTOMY/ CYSTOSCOPY WITH LEFT URETEROSCOPY WITH TRANSURETHRAL RESECTION OF URETERAL ORIFICE (Left)  Patient Location: PACU  Anesthesia Type:General  Level of Consciousness: awake and patient cooperative  Airway & Oxygen Therapy: Patient Spontanous Breathing and Patient connected to face mask  Post-op Assessment: Report given to RN and Post -op Vital signs reviewed and stable  Post vital signs: Reviewed and stable  Last Vitals:  Vitals Value Taken Time  BP    Temp    Pulse    Resp    SpO2      Last Pain:  Vitals:   03/09/21 1054  TempSrc: Oral  PainSc:          Complications: No notable events documented.

## 2021-03-09 NOTE — Anesthesia Preprocedure Evaluation (Addendum)
Anesthesia Evaluation  Patient identified by MRN, date of birth, ID band Patient awake    Reviewed: Allergy & Precautions, NPO status , Patient's Chart, lab work & pertinent test results, reviewed documented beta blocker date and time   Airway Mallampati: II  TM Distance: >3 FB Neck ROM: Full    Dental  (+) Edentulous Upper, Dental Advisory Given, Poor Dentition, Missing,    Pulmonary asthma , sleep apnea and Continuous Positive Airway Pressure Ventilation , pneumonia, resolved, COPD,  COPD inhaler, Current Smoker and Patient abstained from smoking.,    Pulmonary exam normal breath sounds clear to auscultation       Cardiovascular hypertension, Pt. on medications and Pt. on home beta blockers + CAD and + Past MI  Normal cardiovascular exam Rhythm:Regular Rate:Normal  Hx/o Non ischemic CM Hx/o MI no stents  EKG 01/26/21 SB, low voltage  Echo 02/02/21 1. Left ventricular ejection fraction, by estimation, is 60 to 65%. Theleft ventricle has normal function. The left ventricle has no regional wall motion abnormalities. There is mild left ventricular hypertrophy. Left ventricular diastolic parameters were normal. The average left ventricular global longitudinal strain is -18.4 %. The global longitudinal strain is normal.  2. Right ventricular systolic function is normal. The right ventricular size is normal.  3. The mitral valve is abnormal. Trivial mitral valve regurgitation. No evidence of mitral stenosis.  4. The aortic valve is tricuspid. Aortic valve regurgitation is not visualized. Mild to moderate aortic valve sclerosis/calcification is present, without any evidence of aortic stenosis.  5. The inferior vena cava is normal in size with greater than 50% respiratory variability, suggesting right atrial pressure of 3 mmHg.   Myocardia perfusion study 01/31/21 The left ventricular ejection fraction is hyperdynamic (>65%). Nuclear stress  EF: 68%. There was no ST segment deviation noted during stress. The study is normal. This is a low risk study.    Neuro/Psych PSYCHIATRIC DISORDERS Anxiety Depression  Neuromuscular disease    GI/Hepatic negative GI ROS, Neg liver ROS,   Endo/Other  Hyperlipidemia  Renal/GU Renal diseaseLeft renal pelvic mass  negative genitourinary   Musculoskeletal  (+) Arthritis , Osteoarthritis,  Fibromyalgia -  Abdominal (+) + obese,   Peds  Hematology  (+) anemia ,   Anesthesia Other Findings   Reproductive/Obstetrics                            Anesthesia Physical Anesthesia Plan  ASA: 3  Anesthesia Plan: General   Post-op Pain Management:    Induction: Intravenous  PONV Risk Score and Plan: 4 or greater and Treatment may vary due to age or medical condition, Ondansetron and Dexamethasone  Airway Management Planned: Oral ETT  Additional Equipment:   Intra-op Plan:   Post-operative Plan: Extubation in OR  Informed Consent: I have reviewed the patients History and Physical, chart, labs and discussed the procedure including the risks, benefits and alternatives for the proposed anesthesia with the patient or authorized representative who has indicated his/her understanding and acceptance.     Dental advisory given  Plan Discussed with: CRNA and Anesthesiologist  Anesthesia Plan Comments:         Anesthesia Quick Evaluation

## 2021-03-09 NOTE — Discharge Instructions (Signed)

## 2021-03-10 ENCOUNTER — Other Ambulatory Visit (HOSPITAL_COMMUNITY): Payer: Self-pay

## 2021-03-10 ENCOUNTER — Encounter (HOSPITAL_COMMUNITY): Payer: Self-pay | Admitting: Urology

## 2021-03-10 LAB — BASIC METABOLIC PANEL
Anion gap: 8 (ref 5–15)
BUN: 17 mg/dL (ref 8–23)
CO2: 25 mmol/L (ref 22–32)
Calcium: 8.7 mg/dL — ABNORMAL LOW (ref 8.9–10.3)
Chloride: 102 mmol/L (ref 98–111)
Creatinine, Ser: 1.43 mg/dL — ABNORMAL HIGH (ref 0.44–1.00)
GFR, Estimated: 39 mL/min — ABNORMAL LOW (ref >60)
Glucose, Bld: 157 mg/dL — ABNORMAL HIGH (ref 70–99)
Potassium: 4.6 mmol/L (ref 3.5–5.1)
Sodium: 135 mmol/L (ref 135–145)

## 2021-03-10 LAB — HEMOGLOBIN AND HEMATOCRIT, BLOOD
HCT: 40.2 % (ref 36.0–46.0)
Hemoglobin: 13.5 g/dL (ref 12.0–15.0)

## 2021-03-10 LAB — CREATININE, FLUID (PLEURAL, PERITONEAL, JP DRAINAGE): Creat, Fluid: 1.3 mg/dL

## 2021-03-10 MED ORDER — SIMETHICONE 80 MG PO CHEW
80.0000 mg | CHEWABLE_TABLET | Freq: Four times a day (QID) | ORAL | Status: DC | PRN
  Administered 2021-03-10 – 2021-03-11 (×2): 80 mg via ORAL
  Filled 2021-03-10 (×2): qty 1

## 2021-03-10 MED ORDER — BISACODYL 10 MG RE SUPP
10.0000 mg | Freq: Once | RECTAL | Status: AC
  Administered 2021-03-10: 10 mg via RECTAL
  Filled 2021-03-10 (×2): qty 1

## 2021-03-10 NOTE — Progress Notes (Signed)
1 Day Post-Op Subjective: Patient reports nausea and pain control adequate.  UO good and JP output appropriate. Amb this am without difficulty.  No flatus or BM. Using IS. Expected rise in Cr. Hb stable.  Objective: Vital signs in last 24 hours: Temp:  [96 F (35.6 C)-98 F (36.7 C)] 97.7 F (36.5 C) (09/16 [id number]) Pulse Rate:  [59-77] 70 (09/16 0632) Resp:  [10-24] 16 (09/16 [id number]) BP: (113-156)/(61-88) 145/63 (09/16 0632) SpO2:  [91 %-100 %] 97 % (09/16 [id number]) Weight:  [97.5 kg] 97.5 kg (09/15 1859)  Intake/Output from previous day: 09/15 0701 - 09/16 0700 In: 3057.1 [P.O.:360; I.V.:2497.1; IV Piggyback:200] Out: 1750 [Urine:1450; Drains:200; Blood:100] Intake/Output this shift: No intake/output data recorded.  Physical Exam:  General:alert, cooperative, and no distress Cardiovascular: RRR Lungs: Non labored breathing  GI: soft, appropriately tender, decreased bowel sounds, no palpable masses, no organomegaly; JP in place with serosang drainage Incisions: C/D/I Urine: clear Extremities: warm and dry  Lab Results: Recent Labs    03/09/21 1820 03/10/21 0343  HGB 14.1 13.5  HCT 43.4 40.2   BMET Recent Labs    03/10/21 0343  NA 135  K 4.6  CL 102  CO2 25  GLUCOSE 157*  BUN 17  CREATININE 1.43*  CALCIUM 8.7*   No results for input(s): LABPT, INR in the last 72 hours. No results for input(s): LABURIN in the last 72 hours. Results for orders placed or performed in visit on 03/07/21  SARS Coronavirus 2 (TAT 6-24 hrs)     Status: None   Collection Time: 03/07/21 12:00 AM  Result Value Ref Range Status   SARS Coronavirus 2 RESULT: NEGATIVE  Final    Comment: RESULT: NEGATIVESARS-CoV-2 INTERPRETATION:A NEGATIVE  test result means that SARS-CoV-2 RNA was not present in the specimen above the limit of detection of this test. This does not preclude a possible SARS-CoV-2 infection and should not be used as the  sole basis for patient management decisions. Negative results  must be combined with clinical observations, patient history, and epidemiological information. Optimum specimen types and timing for peak viral levels during infections caused by SARS-CoV-2  have not been determined. Collection of multiple specimens or types of specimens may be necessary to detect virus. Improper specimen collection and handling, sequence variability under primers/probes, or organism present below the limit of detection may  lead to false negative results. Positive and negative predictive values of testing are highly dependent on prevalence. False negative test results are more likely when prevalence of disease is high.The expected result is NEGATIVE.Fact S heet for  Healthcare Providers: LocalChronicle.no Sheet for Patients: SalonLookup.es Reference Range - Negative     Studies/Results: No results found.  Assessment/Plan: 1 Day Post-Op, Procedure(s) (LRB): XI ROBOT ASSITED LAPAROSCOPIC NEPHROURETERECTOMY/ CYSTOSCOPY WITH LEFT URETEROSCOPY WITH TRANSURETHRAL RESECTION OF URETERAL ORIFICE (Left)  Ambulate, Incentive spirometry Transition to PO pain medications Check drain creatinine level SCDs Dulcolax supp Rise in Cr expected; recheck in am  If JP Cr consistent with serum plan to d/c drain and foley  Leave diet at clears as pt had nausea this am and no return of bowel function yet Plan for d/c in am if continues to progress     LOS: 1 day   Debbrah Alar 03/10/2021, 7:15 AM

## 2021-03-10 NOTE — Progress Notes (Signed)
Pt decided to take Suppository and asked if we can leave IV out. PA contacted, order received, will monitor for acute changes specifically to BP and intake status, OK to leave out. SRP, RN.

## 2021-03-10 NOTE — Progress Notes (Signed)
JP drain removed as ordered. Pt tolerated well. Suppository given, after about 15 minutes pt up to BR passing gas and small BM. Will cont to monitor. SRP, RN

## 2021-03-10 NOTE — Progress Notes (Signed)
Pt refused Colace and suppository this am as she stated she was passing gas and wanted to try without it first. Pt educated and agreed to take colace and suppository if no BM this am. Morgan Torres

## 2021-03-11 LAB — BASIC METABOLIC PANEL
Anion gap: 9 (ref 5–15)
BUN: 15 mg/dL (ref 8–23)
CO2: 27 mmol/L (ref 22–32)
Calcium: 8.7 mg/dL — ABNORMAL LOW (ref 8.9–10.3)
Chloride: 98 mmol/L (ref 98–111)
Creatinine, Ser: 1.43 mg/dL — ABNORMAL HIGH (ref 0.44–1.00)
GFR, Estimated: 39 mL/min — ABNORMAL LOW (ref >60)
Glucose, Bld: 117 mg/dL — ABNORMAL HIGH (ref 70–99)
Potassium: 4.6 mmol/L (ref 3.5–5.1)
Sodium: 134 mmol/L — ABNORMAL LOW (ref 135–145)

## 2021-03-11 NOTE — Final Progress Note (Signed)
Urology Inpatient Progress Report  Renal mass [N28.89]  Procedure(s): XI ROBOT ASSITED LAPAROSCOPIC NEPHROURETERECTOMY/ CYSTOSCOPY WITH LEFT URETEROSCOPY WITH TRANSURETHRAL RESECTION OF URETERAL ORIFICE  2 Days Post-Op   Intv/Subj: No acute events overnight. Patient is without complaint. Foley removed.  She is having some irritative voiding and voiding small amounts.  She is concerned about having retention when she goes home and having a problem.  Still little bit of fatigue with walking.  Active Problems:   Renal mass  Current Facility-Administered Medications  Medication Dose Route Frequency Provider Last Rate Last Admin   acetaminophen (TYLENOL) tablet 650 mg  650 mg Oral Q4H PRN Debbrah Alar, PA-C   650 mg at 03/11/21 1009   albuterol (PROVENTIL) (2.5 MG/3ML) 0.083% nebulizer solution 2.5 mg  2.5 mg Nebulization Q6H PRN Debbrah Alar, PA-C   2.5 mg at 03/11/21 1329   amLODipine (NORVASC) tablet 5 mg  5 mg Oral Daily Debbrah Alar, PA-C   5 mg at 03/11/21 1009   atorvastatin (LIPITOR) tablet 20 mg  20 mg Oral QHS Dancy, Amanda, PA-C   20 mg at 03/10/21 2129   belladonna-opium (B&O) suppository 16.2-'60mg'$   1 suppository Rectal Q6H PRN Debbrah Alar, PA-C       dextrose 5 %-0.45 % sodium chloride infusion   Intravenous Continuous Debbrah Alar, PA-C 75 mL/hr at 03/10/21 0433 New Bag at 03/10/21 0433   diphenhydrAMINE (BENADRYL) injection 12.5 mg  12.5 mg Intravenous Q6H PRN Debbrah Alar, PA-C       Or   diphenhydrAMINE (BENADRYL) 12.5 MG/5ML elixir 12.5 mg  12.5 mg Oral Q6H PRN Dancy, Amanda, PA-C       docusate sodium (COLACE) capsule 100 mg  100 mg Oral BID Dancy, Amanda, PA-C   100 mg at 03/11/21 1009   HYDROmorphone (DILAUDID) injection 0.5-1 mg  0.5-1 mg Intravenous Q2H PRN Debbrah Alar, PA-C   0.5 mg at 03/10/21 0859   LORazepam (ATIVAN) tablet 0.5 mg  0.5 mg Oral Daily PRN Debbrah Alar, PA-C   0.5 mg at 03/11/21 1351   metoprolol tartrate (LOPRESSOR) tablet 25 mg  25 mg  Oral BID Debbrah Alar, PA-C   25 mg at 03/11/21 1009   montelukast (SINGULAIR) tablet 10 mg  10 mg Oral QHS Dancy, Amanda, PA-C   10 mg at 03/10/21 2128   nicotine (NICODERM CQ - dosed in mg/24 hours) patch 21 mg  21 mg Transdermal Daily Debbrah Alar, PA-C   21 mg at 03/11/21 1009   nitroGLYCERIN (NITROSTAT) SL tablet 0.4 mg  0.4 mg Sublingual Q5 Min x 3 PRN Debbrah Alar, PA-C       ondansetron (ZOFRAN) injection 4 mg  4 mg Intravenous Q4H PRN Debbrah Alar, PA-C   4 mg at 03/10/21 0238   simethicone (MYLICON) chewable tablet 80 mg  80 mg Oral QID PRN Debbrah Alar, PA-C   80 mg at 03/10/21 1606   traMADol (ULTRAM) tablet 50-100 mg  50-100 mg Oral Q6H PRN Debbrah Alar, PA-C   50 mg at 03/11/21 1009     Objective: Vital: Vitals:   03/10/21 1405 03/10/21 2130 03/11/21 0524 03/11/21 1329  BP: (!) 142/62 (!) 148/49 124/70   Pulse: 60 64 65   Resp: '16 20 20   '$ Temp: 98.3 F (36.8 C) 97.8 F (36.6 C) 97.9 F (36.6 C)   TempSrc: Oral Oral Oral   SpO2: 91% 92% 96% 93%  Weight:      Height:       I/Os: I/O last 3  completed shifts: In: 2817.1 [P.O.:1920; I.V.:797.1; IV Piggyback:100] Out: 3400 [Urine:3150; Drains:250]  Physical Exam:  General: Patient is in no apparent distress Lungs: Normal respiratory effort, chest expands symmetrically. GI: Incisions are c/d/i. The abdomen is soft and minimally tender without mass. Ext: lower extremities symmetric  Lab Results: Recent Labs    03/09/21 1820 03/10/21 0343  HGB 14.1 13.5  HCT 43.4 40.2   Recent Labs    03/10/21 0343 03/11/21 0413  NA 135 134*  K 4.6 4.6  CL 102 98  CO2 25 27  GLUCOSE 157* 117*  BUN 17 15  CREATININE 1.43* 1.43*  CALCIUM 8.7* 8.7*   No results for input(s): LABPT, INR in the last 72 hours. No results for input(s): LABURIN in the last 72 hours. Results for orders placed or performed in visit on 03/07/21  SARS Coronavirus 2 (TAT 6-24 hrs)     Status: None   Collection Time: 03/07/21 12:00 AM   Result Value Ref Range Status   SARS Coronavirus 2 RESULT: NEGATIVE  Final    Comment: RESULT: NEGATIVESARS-CoV-2 INTERPRETATION:A NEGATIVE  test result means that SARS-CoV-2 RNA was not present in the specimen above the limit of detection of this test. This does not preclude a possible SARS-CoV-2 infection and should not be used as the  sole basis for patient management decisions. Negative results must be combined with clinical observations, patient history, and epidemiological information. Optimum specimen types and timing for peak viral levels during infections caused by SARS-CoV-2  have not been determined. Collection of multiple specimens or types of specimens may be necessary to detect virus. Improper specimen collection and handling, sequence variability under primers/probes, or organism present below the limit of detection may  lead to false negative results. Positive and negative predictive values of testing are highly dependent on prevalence. False negative test results are more likely when prevalence of disease is high.The expected result is NEGATIVE.Fact S heet for  Healthcare Providers: LocalChronicle.no Sheet for Patients: SalonLookup.es Reference Range - Negative     Studies/Results: No results found.  Assessment: Left renal pelvic TCC  Procedure(s): XI ROBOT ASSITED LAPAROSCOPIC NEPHROURETERECTOMY/ CYSTOSCOPY WITH LEFT URETEROSCOPY WITH TRANSURETHRAL RESECTION OF URETERAL ORIFICE, 2 Days Post-Op  doing well.  Plan: Will follow for another day and ensure she does well with voiding and has no problems overnight.   Link Snuffer, MD Urology 03/11/2021, 2:50 PM

## 2021-03-11 NOTE — Plan of Care (Signed)

## 2021-03-11 NOTE — Progress Notes (Signed)
Cancel discharge for today, enc fluids, IS and ambulation. SRP, RN

## 2021-03-11 NOTE — Discharge Summary (Addendum)
Physician Discharge Summary  Patient ID: Morgan Torres MRN: [id number] DOB/AGE: December 12, 1950 70 y.o.  Admit date: 03/09/2021 Discharge date: 03/12/2021  Admission Diagnoses:  Discharge Diagnoses:  Active Problems:   Renal mass   Discharged Condition: good  Hospital Course: Patient underwent robotic assisted laparoscopic left nephro ureterectomy on 03/09/2021.  She tolerated the procedure well and was stable postoperative.  JP drain removed on postoperative day 1.  Foley catheter removed for voiding trial on postoperative day 2.  She is ambulating well, voiding with minimal complaints.  She was voiding small amounts and also feeling a little bit weak in the afternoon and therefore stayed for another night.  The following day, she is voiding well and she is stable for discharge.  Consults: None  Significant Diagnostic Studies: None  Treatments: surgery: As above  Discharge Exam: Blood pressure 124/70, pulse 65, temperature 97.9 F (36.6 C), temperature source Oral, resp. rate 20, height '4\' 10"'$  (1.473 m), weight 97.5 kg, SpO2 96 %. General appearance: alert no acute distress Adequate perfusion of extremities Nonlabored respiration Abdomen soft, minimally tender, incisions clean dry and intact  Disposition: Discharge disposition: 01-Home or Self Care       Discharge Instructions     No wound care   Complete by: As directed       Allergies as of 03/11/2021       Reactions   Diclofenac Anaphylaxis   Codeine Itching   Hydrocodone Itching   Ciprofloxacin Rash        Medication List     TAKE these medications    acetaminophen 500 MG tablet Commonly known as: TYLENOL Take 2 tablets (1,000 mg total) by mouth every 6 (six) hours as needed for mild pain.   albuterol 108 (90 Base) MCG/ACT inhaler Commonly known as: VENTOLIN HFA Inhale 2 puffs into the lungs every 6 (six) hours as needed for wheezing or shortness of breath.   amLODipine 5 MG tablet Commonly known  as: NORVASC TAKE 1 TABLET BY MOUTH EVERY DAY   atorvastatin 20 MG tablet Commonly known as: LIPITOR Take 20 mg by mouth at bedtime.   cetirizine 10 MG tablet Commonly known as: ZYRTEC Take 1 tablet (10 mg total) by mouth daily.   cyclobenzaprine 10 MG tablet Commonly known as: FLEXERIL Take 0.5 tablets (5 mg total) by mouth at bedtime.   docusate sodium 100 MG capsule Commonly known as: COLACE Take 1 capsule (100 mg total) by mouth 2 (two) times daily.   EPINEPHrine 0.3 mg/0.3 mL Soaj injection Commonly known as: EpiPen 2-Pak Inject 0.3 mLs (0.3 mg total) into the muscle once as needed (for severe allergic reaction). CAll 911 immediately if you have to use this medicine   LORazepam 0.5 MG tablet Commonly known as: ATIVAN Take 0.5 mg by mouth daily as needed for anxiety.   losartan-hydrochlorothiazide 100-12.5 MG tablet Commonly known as: HYZAAR Take 1 tablet by mouth daily.   magnesium hydroxide 400 MG/5ML suspension Commonly known as: MILK OF MAGNESIA Take 15 mLs by mouth daily as needed for mild constipation.   metoprolol tartrate 25 MG tablet Commonly known as: LOPRESSOR TAKE 1 TABLET (25 MG TOTAL) BY MOUTH 2 (TWO) TIMES DAILY. PT NEEDS TO MAKE APPT WITH PROVIDER FOR FURTHER REFILLS - 1ST ATTEMPT   montelukast 10 MG tablet Commonly known as: SINGULAIR Take 10 mg by mouth at bedtime.   nicotine 21 mg/24hr patch Commonly known as: NICODERM CQ - dosed in mg/24 hours Place 21 mg onto the skin daily.  nicotine polacrilex 4 MG gum Commonly known as: NICORETTE Take 4 mg by mouth as needed for smoking cessation.   nitroGLYCERIN 0.4 MG SL tablet Commonly known as: NITROSTAT Place 1 tablet (0.4 mg total) under the tongue every 5 (five) minutes x 3 doses as needed for chest pain.   promethazine 12.5 MG tablet Commonly known as: PHENERGAN Take 1 tablet (12.5 mg total) by mouth every 4 (four) hours as needed for nausea or vomiting.   traMADol 50 MG tablet Commonly  known as: ULTRAM Take 1-2 tablets (50-100 mg total) by mouth every 6 (six) hours as needed for moderate pain. What changed:  how much to take when to take this        Follow-up Information     Ceasar Mons, MD Follow up on 03/17/2021.   Specialty: Urology Why: at 10:00 Contact information: [address] [place] [address] [phone]                 Signed: Marton Redwood, III 03/11/2021, 11:57 AM

## 2021-03-11 NOTE — Progress Notes (Signed)
Pt became sleepy and tired after Tramadol this am. Voided 100 cc this am 0930, post foley removal . Pt has ambulated in hall 3 times currently. Request breathing treatment, r/o to SOB and congestion. Encourage IS, Respiratory at bedside with treatment. SRP RN

## 2021-03-11 NOTE — Progress Notes (Signed)
Foley d/c'ed at this time per MD order. Pt tolerated well.

## 2021-03-12 NOTE — Progress Notes (Signed)
Went over discharge teaching with the patient and spouse at bedside. Patient stated that she understood discharge instructions and had no further questions.

## 2021-03-13 LAB — SURGICAL PATHOLOGY

## 2021-03-13 NOTE — Anesthesia Postprocedure Evaluation (Signed)
Anesthesia Post Note  Patient: Morgan Torres  Procedure(s) Performed: XI ROBOT ASSITED LAPAROSCOPIC NEPHROURETERECTOMY/ CYSTOSCOPY WITH LEFT URETEROSCOPY WITH TRANSURETHRAL RESECTION OF URETERAL ORIFICE (Left)     Patient location during evaluation: PACU Anesthesia Type: General Level of consciousness: awake and alert Pain management: pain level controlled Vital Signs Assessment: post-procedure vital signs reviewed and stable Respiratory status: spontaneous breathing, nonlabored ventilation, respiratory function stable and patient connected to nasal cannula oxygen Cardiovascular status: blood pressure returned to baseline and stable Postop Assessment: no apparent nausea or vomiting Anesthetic complications: no   No notable events documented.            Effie Berkshire

## 2021-03-17 DIAGNOSIS — C652 Malignant neoplasm of left renal pelvis: Secondary | ICD-10-CM | POA: Diagnosis not present

## 2021-03-21 DIAGNOSIS — C652 Malignant neoplasm of left renal pelvis: Secondary | ICD-10-CM | POA: Diagnosis not present

## 2021-03-21 DIAGNOSIS — I10 Essential (primary) hypertension: Secondary | ICD-10-CM | POA: Diagnosis not present

## 2021-03-21 DIAGNOSIS — J449 Chronic obstructive pulmonary disease, unspecified: Secondary | ICD-10-CM | POA: Diagnosis not present

## 2021-03-24 DIAGNOSIS — J449 Chronic obstructive pulmonary disease, unspecified: Secondary | ICD-10-CM | POA: Diagnosis not present

## 2021-03-24 DIAGNOSIS — I10 Essential (primary) hypertension: Secondary | ICD-10-CM | POA: Diagnosis not present

## 2021-03-24 DIAGNOSIS — J45998 Other asthma: Secondary | ICD-10-CM | POA: Diagnosis not present

## 2021-03-24 DIAGNOSIS — Z720 Tobacco use: Secondary | ICD-10-CM | POA: Diagnosis not present

## 2021-03-29 ENCOUNTER — Other Ambulatory Visit: Payer: Self-pay | Admitting: Internal Medicine

## 2021-04-18 DIAGNOSIS — I10 Essential (primary) hypertension: Secondary | ICD-10-CM | POA: Diagnosis not present

## 2021-04-18 DIAGNOSIS — Z720 Tobacco use: Secondary | ICD-10-CM | POA: Diagnosis not present

## 2021-04-18 DIAGNOSIS — I7389 Other specified peripheral vascular diseases: Secondary | ICD-10-CM | POA: Diagnosis not present

## 2021-04-18 DIAGNOSIS — Z23 Encounter for immunization: Secondary | ICD-10-CM | POA: Diagnosis not present

## 2021-04-18 DIAGNOSIS — R739 Hyperglycemia, unspecified: Secondary | ICD-10-CM | POA: Diagnosis not present

## 2021-04-18 DIAGNOSIS — J449 Chronic obstructive pulmonary disease, unspecified: Secondary | ICD-10-CM | POA: Diagnosis not present

## 2021-04-18 DIAGNOSIS — C652 Malignant neoplasm of left renal pelvis: Secondary | ICD-10-CM | POA: Diagnosis not present

## 2021-04-19 ENCOUNTER — Other Ambulatory Visit (HOSPITAL_COMMUNITY): Payer: Self-pay | Admitting: Family Medicine

## 2021-04-19 DIAGNOSIS — L819 Disorder of pigmentation, unspecified: Secondary | ICD-10-CM

## 2021-04-21 DIAGNOSIS — C652 Malignant neoplasm of left renal pelvis: Secondary | ICD-10-CM | POA: Diagnosis not present

## 2021-04-27 ENCOUNTER — Other Ambulatory Visit: Payer: Self-pay

## 2021-04-27 ENCOUNTER — Ambulatory Visit (HOSPITAL_COMMUNITY)
Admission: RE | Admit: 2021-04-27 | Discharge: 2021-04-27 | Disposition: A | Discharge status: Home / Self Care | Payer: Medicare Other | Source: Ambulatory Visit | Attending: Family Medicine | Admitting: Family Medicine

## 2021-04-27 ENCOUNTER — Ambulatory Visit (HOSPITAL_BASED_OUTPATIENT_CLINIC_OR_DEPARTMENT_OTHER)
Admission: RE | Admit: 2021-04-27 | Discharge: 2021-04-27 | Disposition: A | Discharge status: Home / Self Care | Payer: Medicare Other | Source: Ambulatory Visit

## 2021-04-27 DIAGNOSIS — I70201 Unspecified atherosclerosis of native arteries of extremities, right leg: Secondary | ICD-10-CM | POA: Diagnosis not present

## 2021-04-27 DIAGNOSIS — L819 Disorder of pigmentation, unspecified: Secondary | ICD-10-CM | POA: Insufficient documentation

## 2021-04-27 NOTE — Progress Notes (Signed)
VASCULAR LAB    Right lower extremity arterial duplex has been performed.  See CV proc for preliminary results.   Matraca Hunkins, RVT 04/27/2021, 2:06 PM

## 2021-04-27 NOTE — Progress Notes (Signed)
VASCULAR LAB    ABIs have been performed.  See CV proc for preliminary results.   Orion Vandervort, RVT 04/27/2021, 2:05 PM

## 2021-05-08 NOTE — Progress Notes (Signed)
[facility] [place]  ASSESSMENT / PLAN: Morgan Torres is a 70 y.o. female with atherosclerosis of native arteries of right lower extremity causing  atypical symptoms .  Counseled the patient that she has no clear limb threatening symptoms, but does have worrisome noninvasive findings.  Recommend the following which can slow the progression of atherosclerosis and reduce the risk of major adverse cardiac / limb events:  Complete cessation from all tobacco products. Blood glucose control with goal A1c < 7%. Blood pressure control with goal blood pressure < 140/90 mmHg. Lipid reduction therapy with goal LDL-C <100 mg/dL (<70 if symptomatic from PAD).  Aspirin 81mg  PO QD.  Atorvastatin 40-80mg  PO QD (or other "high intensity" statin therapy). Daily walking to and past the point of discomfort. Patient counseled to keep a log of exercise distance.  We will plan to see her in close interval follow-up to monitor her worrisome ABI.  I counseled the patient that should she develop symptoms of chronic limb threatening ischemia (e.g. continuous pain in the foot, or ulceration about the foot) that she should present to care as soon as possible.  Otherwise we will see her in 1 to 2 months for a recheck.  CHIEF COMPLAINT: right foot numbness  HISTORY OF PRESENT ILLNESS: Morgan Torres is a 70 y.o. female who presents to clinic for evaluation of peripheral arterial disease. She report chronic fatigue. She describes exercise intolerance. She reports numbness in the left foot and a sensation of coldness. The patient does not report classic symptoms of claudication. She does report being awoken by discomfort in the right foot, but no constant pain relieved by dependency. She denies ulceration about the right foot.  Past Medical History:  Diagnosis Date   Anxiety    Arthritis    hip, lumbar spine    Asthma    seasonal allergies    Bronchitis    Hx: of   Chronic kidney disease     COPD (chronic obstructive pulmonary disease) (HCC)    Coronary artery disease    Depression    Fibromyalgia    Hypertension    Lumbar herniated disc    Myocardial infarction ([place]) 06/25/2012   followed by Dr. Debara Pickett, treated medically, no stents   Pneumonia    Hx: of   Sciatica    Sleep apnea    Small bowel obstruction ([place])     Past Surgical History:  Procedure Laterality Date   ABDOMINAL HYSTERECTOMY     ABDOMINAL SURGERY     resection of small intestine   BACK SURGERY     fusion   CARDIAC CATHETERIZATION  2014   COLON SURGERY     resection for bowel - for obstruction    COLONOSCOPY     Hx; of   DILATION AND CURETTAGE OF UTERUS     HERNIA REPAIR  1970's   inguinal hernia    LEFT HEART CATHETERIZATION WITH CORONARY ANGIOGRAM N/A 03/30/2013   Procedure: LEFT HEART CATHETERIZATION WITH CORONARY ANGIOGRAM;  Surgeon: Leonie Man, MD;  Location: [facility] CATH LAB;  Service: Cardiovascular;  Laterality: N/A;   ROBOT ASSITED LAPAROSCOPIC NEPHROURETERECTOMY Left 03/09/2021   Procedure: XI ROBOT ASSITED LAPAROSCOPIC NEPHROURETERECTOMY/ CYSTOSCOPY WITH LEFT URETEROSCOPY WITH TRANSURETHRAL RESECTION OF URETERAL ORIFICE;  Surgeon: Ceasar Mons, MD;  Location: [facility];  Service: Urology;  Laterality: Left;   SALIVARY GLAND SURGERY Left 1970-1980   approached from inside mouth & side of neck   TONSILLECTOMY  TOTAL HIP ARTHROPLASTY Left 05/28/2014   Procedure: LEFT TOTAL HIP ARTHROPLASTY;  Surgeon: Yvette Rack., MD;  Location: [facility];  Service: Orthopedics;  Laterality: Left;   TOTAL HIP ARTHROPLASTY Left 05/28/2014   dr caffrey    Family History  Problem Relation Age of Onset   Hypertension Mother    Diabetes Mother    Cancer - Other Mother    Cancer Sister     Social History   Socioeconomic History   Marital status: Married    Spouse name: Not on file   Number of children: Not on file   Years of education: Not on file   Highest education level: Not on file   Occupational History   Not on file  Tobacco Use   Smoking status: Every Day    Packs/day: 0.40    Years: 25.00    Pack years: 10.00    Types: Cigarettes   Smokeless tobacco: Never   Tobacco comments:    Currently on the Nicoderm patch."smokes a few"  Vaping Use   Vaping Use: Never used  Substance and Sexual Activity   Alcohol use: Yes    Comment: occasionally   Drug use: No   Sexual activity: Not Currently  Other Topics Concern   Not on file  Social History Narrative   Not on file   Social Determinants of Health   Financial Resource Strain: Not on file  Food Insecurity: Not on file  Transportation Needs: Not on file  Physical Activity: Not on file  Stress: Not on file  Social Connections: Not on file  Intimate Partner Violence: Not on file    Allergies  Allergen Reactions   Diclofenac Anaphylaxis   Codeine Itching   Hydrocodone Itching   Ciprofloxacin Rash    Current Outpatient Medications  Medication Sig Dispense Refill   acetaminophen (TYLENOL) 500 MG tablet Take 2 tablets (1,000 mg total) by mouth every 6 (six) hours as needed for mild pain. 30 tablet 0   albuterol (PROVENTIL HFA;VENTOLIN HFA) 108 (90 Base) MCG/ACT inhaler Inhale 2 puffs into the lungs every 6 (six) hours as needed for wheezing or shortness of breath. 1 Inhaler 0   amLODipine (NORVASC) 5 MG tablet TAKE 1 TABLET BY MOUTH EVERY DAY 90 tablet 3   atorvastatin (LIPITOR) 20 MG tablet Take 20 mg by mouth at bedtime.     cetirizine (ZYRTEC) 10 MG tablet Take 1 tablet (10 mg total) by mouth daily. (Patient not taking: No sig reported) 90 tablet 0   cyclobenzaprine (FLEXERIL) 10 MG tablet Take 0.5 tablets (5 mg total) by mouth at bedtime. (Patient not taking: No sig reported) 20 tablet 0   docusate sodium (COLACE) 100 MG capsule Take 1 capsule (100 mg total) by mouth 2 (two) times daily.     EPINEPHrine (EPIPEN 2-PAK) 0.3 mg/0.3 mL DEVI Inject 0.3 mLs (0.3 mg total) into the muscle once as needed (for  severe allergic reaction). CAll 911 immediately if you have to use this medicine 1 Device 1   LORazepam (ATIVAN) 0.5 MG tablet Take 0.5 mg by mouth daily as needed for anxiety.     losartan-hydrochlorothiazide (HYZAAR) 100-12.5 MG tablet Take 1 tablet by mouth daily.     magnesium hydroxide (MILK OF MAGNESIA) 400 MG/5ML suspension Take 15 mLs by mouth daily as needed for mild constipation.     metoprolol tartrate (LOPRESSOR) 25 MG tablet TAKE 1 TABLET (25 MG TOTAL) BY MOUTH 2 (TWO) TIMES DAILY. PT NEEDS TO MAKE APPT  WITH PROVIDER FOR FURTHER REFILLS - 1ST ATTEMPT 90 tablet 0   montelukast (SINGULAIR) 10 MG tablet Take 10 mg by mouth at bedtime.     nicotine (NICODERM CQ - DOSED IN MG/24 HOURS) 21 mg/24hr patch Place 21 mg onto the skin daily.     nicotine polacrilex (NICORETTE) 4 MG gum Take 4 mg by mouth as needed for smoking cessation.     nitroGLYCERIN (NITROSTAT) 0.4 MG SL tablet Place 1 tablet (0.4 mg total) under the tongue every 5 (five) minutes x 3 doses as needed for chest pain. 25 tablet 2   promethazine (PHENERGAN) 12.5 MG tablet Take 1 tablet (12.5 mg total) by mouth every 4 (four) hours as needed for nausea or vomiting. 15 tablet 0   traMADol (ULTRAM) 50 MG tablet Take 1-2 tablets (50-100 mg total) by mouth every 6 (six) hours as needed for moderate pain. 20 tablet 0   No current facility-administered medications for this visit.    REVIEW OF SYSTEMS:  [X]  denotes positive finding, [ ]  denotes negative finding Cardiac  Comments:  Chest pain or chest pressure:    Shortness of breath upon exertion:    Short of breath when lying flat:    Irregular heart rhythm:        Vascular    Pain in calf, thigh, or hip brought on by ambulation:    Pain in feet at night that wakes you up from your sleep:     Blood clot in your veins:    Leg swelling:         Pulmonary    Oxygen at home:    Productive cough:     Wheezing:         Neurologic    Sudden weakness in arms or legs:      Sudden numbness in arms or legs:     Sudden onset of difficulty speaking or slurred speech:    Temporary loss of vision in one eye:     Problems with dizziness:         Gastrointestinal    Blood in stool:     Vomited blood:         Genitourinary    Burning when urinating:     Blood in urine:        Psychiatric    Major depression:         Hematologic    Bleeding problems:    Problems with blood clotting too easily:        Skin    Rashes or ulcers:        Constitutional    Fever or chills:      PHYSICAL EXAM Vitals:   05/09/21 1452  BP: 134/71  Pulse: 63  Resp: 20  Temp: 97.8 F (36.6 C)  SpO2: 94%  Weight: 208 lb (94.3 kg)  Height: 4\' 10"  (1.473 m)    Constitutional: well appearing. no distress. Appears well nourished.  Neurologic: CN intact. no focal findings. no sensory loss. Psychiatric:  Mood and affect symmetric and appropriate. Eyes:  No icterus. No conjunctival pallor. Ears, nose, throat:  mucous membranes moist. Midline trachea.  Cardiac: regular rate and rhythm.  Respiratory:  unlabored. Abdominal:  soft, non-tender, non-distended.  Peripheral vascular: no palpable pedal or popliteal pulses Extremity: no edema. no cyanosis. no pallor.  Skin: no gangrene. no ulceration.  Lymphatic: no Stemmer's sign. no palpable lymphadenopathy.  PERTINENT LABORATORY AND RADIOLOGIC DATA  Most recent CBC CBC Latest Ref Rng &  Units 03/10/2021 03/09/2021 02/28/2021  WBC 4.0 - 10.5 K/uL - - 6.8  Hemoglobin 12.0 - 15.0 g/dL 13.5 14.1 13.6  Hematocrit 36.0 - 46.0 % 40.2 43.4 41.5  Platelets 150 - 400 K/uL - - 296     Most recent CMP CMP Latest Ref Rng & Units 03/11/2021 03/10/2021 02/28/2021  Glucose 70 - 99 mg/dL 117(H) 157(H) 96  BUN 8 - 23 mg/dL 15 17 22   Creatinine 0.44 - 1.00 mg/dL 1.43(H) 1.43(H) 0.96  Sodium 135 - 145 mmol/L 134(L) 135 137  Potassium 3.5 - 5.1 mmol/L 4.6 4.6 4.1  Chloride 98 - 111 mmol/L 98 102 104  CO2 22 - 32 mmol/L 27 25 24   Calcium 8.9 -  10.3 mg/dL 8.7(L) 8.7(L) 9.5  Total Protein 6.0 - 8.3 g/dL - - -  Total Bilirubin 0.3 - 1.2 mg/dL - - -  Alkaline Phos 39 - 117 U/L - - -  AST 0 - 37 U/L - - -  ALT 0 - 35 U/L - - -    Renal function CrCl cannot be calculated (Patient's most recent lab result is older than the maximum 21 days allowed.).  Hgb A1c MFr Bld (%)  Date Value  03/29/2013 6.0 (H)    LDL Cholesterol  Date Value Ref Range Status  03/30/2013 159 (H) 0 - 99 mg/dL Final    Comment:           Total Cholesterol/HDL:CHD Risk Coronary Heart Disease Risk Table                     Men   Women  1/2 Average Risk   3.4   3.3  Average Risk       5.0   4.4  2 X Average Risk   9.6   7.1  3 X Average Risk  23.4   11.0        Use the calculated Patient Ratio above and the CHD Risk Table to determine the patient's CHD Risk.        ATP III CLASSIFICATION (LDL):  <100     mg/dL   Optimal  100-129  mg/dL   Near or Above                    Optimal  130-159  mg/dL   Borderline  160-189  mg/dL   High  >190     mg/dL   Very High     Vascular Imaging:  ABI Findings:  +---------+------------------+-----+-------------------+--------+  Right    Rt Pressure (mmHg)IndexWaveform           Comment   +---------+------------------+-----+-------------------+--------+  Brachial 120                    multiphasic                  +---------+------------------+-----+-------------------+--------+  PTA                             absent                       +---------+------------------+-----+-------------------+--------+  DP       57                0.48 dampened monophasic          +---------+------------------+-----+-------------------+--------+  Rella Larve  0.00 Absent                       +---------+------------------+-----+-------------------+--------+   +---------+------------------+-----+-----------+-------+  Left     Lt Pressure (mmHg)IndexWaveform   Comment   +---------+------------------+-----+-----------+-------+  Brachial 117                    multiphasic         +---------+------------------+-----+-----------+-------+  PTA      128               1.07 multiphasic         +---------+------------------+-----+-----------+-------+  DP       136               1.13 multiphasic         +---------+------------------+-----+-----------+-------+  Great Toe49                0.41                     +---------+------------------+-----+-----------+-------+   Lower extremity duplex Right: Diffuse calcific plaque and monophasic waveforms throughout the  right lower extremity. The distal posterior tibial artery appears occluded  distally. There appears to be collateral flow at the distal anterior  tibial artery.   Yevonne Aline. Stanford Breed, MD [facility] Phone Number: [phone] 05/08/2021 6:19 PM  Total time spent on preparing this encounter including chart review, data review, collecting history, examining the patient, coordinating care for this new patient, 60 minutes.  Portions of this report may have been transcribed using voice recognition software.  Every effort has been made to ensure accuracy; however, inadvertent computerized transcription errors may still be present.

## 2021-05-09 ENCOUNTER — Ambulatory Visit: Payer: Medicare Other | Admitting: Vascular Surgery

## 2021-05-09 ENCOUNTER — Other Ambulatory Visit: Payer: Self-pay

## 2021-05-09 ENCOUNTER — Encounter: Payer: Self-pay | Admitting: Vascular Surgery

## 2021-05-09 VITALS — BP 134/71 | HR 63 | Temp 97.8°F | Resp 20 | Ht <= 58 in | Wt 208.0 lb

## 2021-05-09 DIAGNOSIS — I739 Peripheral vascular disease, unspecified: Secondary | ICD-10-CM | POA: Diagnosis not present

## 2021-05-10 ENCOUNTER — Other Ambulatory Visit (HOSPITAL_COMMUNITY): Payer: Self-pay | Admitting: Family Medicine

## 2021-05-10 DIAGNOSIS — I7389 Other specified peripheral vascular diseases: Secondary | ICD-10-CM

## 2021-05-11 DIAGNOSIS — Z1231 Encounter for screening mammogram for malignant neoplasm of breast: Secondary | ICD-10-CM | POA: Diagnosis not present

## 2021-05-17 ENCOUNTER — Ambulatory Visit: Payer: Medicare Other | Admitting: Internal Medicine

## 2021-05-23 DIAGNOSIS — Z720 Tobacco use: Secondary | ICD-10-CM | POA: Diagnosis not present

## 2021-05-23 DIAGNOSIS — E785 Hyperlipidemia, unspecified: Secondary | ICD-10-CM | POA: Diagnosis not present

## 2021-05-23 DIAGNOSIS — I7092 Chronic total occlusion of artery of the extremities: Secondary | ICD-10-CM | POA: Diagnosis not present

## 2021-05-23 DIAGNOSIS — J449 Chronic obstructive pulmonary disease, unspecified: Secondary | ICD-10-CM | POA: Diagnosis not present

## 2021-05-23 DIAGNOSIS — I10 Essential (primary) hypertension: Secondary | ICD-10-CM | POA: Diagnosis not present

## 2021-06-01 DIAGNOSIS — M170 Bilateral primary osteoarthritis of knee: Secondary | ICD-10-CM | POA: Diagnosis not present

## 2021-06-01 DIAGNOSIS — G4733 Obstructive sleep apnea (adult) (pediatric): Secondary | ICD-10-CM | POA: Diagnosis not present

## 2021-06-08 DIAGNOSIS — M170 Bilateral primary osteoarthritis of knee: Secondary | ICD-10-CM | POA: Diagnosis not present

## 2021-06-15 DIAGNOSIS — M170 Bilateral primary osteoarthritis of knee: Secondary | ICD-10-CM | POA: Diagnosis not present

## 2021-06-23 DIAGNOSIS — J449 Chronic obstructive pulmonary disease, unspecified: Secondary | ICD-10-CM | POA: Diagnosis not present

## 2021-06-23 DIAGNOSIS — J45998 Other asthma: Secondary | ICD-10-CM | POA: Diagnosis not present

## 2021-06-23 DIAGNOSIS — I10 Essential (primary) hypertension: Secondary | ICD-10-CM | POA: Diagnosis not present

## 2021-06-26 NOTE — Progress Notes (Signed)
[facility] [place]  ASSESSMENT / PLAN: Morgan Torres is a 71 y.o. female with atherosclerosis of native arteries of right lower extremity causing  atypical symptoms .  Counseled the patient that she has no clear limb threatening symptoms, but does have worrisome noninvasive findings.  Recommend the following which can slow the progression of atherosclerosis and reduce the risk of major adverse cardiac / limb events:  Complete cessation from all tobacco products. Blood glucose control with goal A1c < 7%. Blood pressure control with goal blood pressure < 140/90 mmHg. Lipid reduction therapy with goal LDL-C <100 mg/dL (<70 if symptomatic from PAD).  Aspirin 81mg  PO QD.  Atorvastatin 40-80mg  PO QD (or other "high intensity" statin therapy). Daily walking to and past the point of discomfort. Patient counseled to keep a log of exercise distance.   I counseled the patient that should she develop symptoms of chronic limb threatening ischemia (e.g. continuous pain in the foot, or ulceration about the foot) that she should present to care as soon as possible.  Otherwise we will see her in 6 months with repeat ABI.  CHIEF COMPLAINT: right foot numbness  HISTORY OF PRESENT ILLNESS: Morgan Torres is a 71 y.o. female who presents to clinic for evaluation of peripheral arterial disease. She report chronic fatigue. She describes exercise intolerance. She reports numbness in the left foot and a sensation of coldness. The patient does not report classic symptoms of claudication. She does report being awoken by discomfort in the right foot, but no constant pain relieved by dependency. She denies ulceration about the right foot.  06/27/20: Returns to clinic for evaluation.  She reports no real change in her symptoms.  She does not have ischemic rest pain in her right foot.  She has no ulcers about her right foot.  Past Medical History:  Diagnosis Date   Anxiety    Arthritis    hip,  lumbar spine    Asthma    seasonal allergies    Bronchitis    Hx: of   Chronic kidney disease    COPD (chronic obstructive pulmonary disease) (HCC)    Coronary artery disease    Depression    Fibromyalgia    Hypertension    Lumbar herniated disc    Myocardial infarction ([place]) 06/25/2012   followed by Dr. Debara Pickett, treated medically, no stents   Pneumonia    Hx: of   Sciatica    Sleep apnea    Small bowel obstruction ([place])     Past Surgical History:  Procedure Laterality Date   ABDOMINAL HYSTERECTOMY     ABDOMINAL SURGERY     resection of small intestine   BACK SURGERY     fusion   CARDIAC CATHETERIZATION  2014   COLON SURGERY     resection for bowel - for obstruction    COLONOSCOPY     Hx; of   DILATION AND CURETTAGE OF UTERUS     HERNIA REPAIR  1970's   inguinal hernia    LEFT HEART CATHETERIZATION WITH CORONARY ANGIOGRAM N/A 03/30/2013   Procedure: LEFT HEART CATHETERIZATION WITH CORONARY ANGIOGRAM;  Surgeon: Leonie Man, MD;  Location: [facility] CATH LAB;  Service: Cardiovascular;  Laterality: N/A;   ROBOT ASSITED LAPAROSCOPIC NEPHROURETERECTOMY Left 03/09/2021   Procedure: XI ROBOT ASSITED LAPAROSCOPIC NEPHROURETERECTOMY/ CYSTOSCOPY WITH LEFT URETEROSCOPY WITH TRANSURETHRAL RESECTION OF URETERAL ORIFICE;  Surgeon: Ceasar Mons, MD;  Location: [facility];  Service: Urology;  Laterality: Left;   SALIVARY GLAND  SURGERY Left 1970-1980   approached from inside mouth & side of neck   TONSILLECTOMY     TOTAL HIP ARTHROPLASTY Left 05/28/2014   Procedure: LEFT TOTAL HIP ARTHROPLASTY;  Surgeon: Yvette Rack., MD;  Location: [facility];  Service: Orthopedics;  Laterality: Left;   TOTAL HIP ARTHROPLASTY Left 05/28/2014   dr caffrey    Family History  Problem Relation Age of Onset   Hypertension Mother    Diabetes Mother    Cancer - Other Mother    Cancer Sister     Social History   Socioeconomic History   Marital status: Married    Spouse name: Not on file   Number  of children: Not on file   Years of education: Not on file   Highest education level: Not on file  Occupational History   Not on file  Tobacco Use   Smoking status: Every Day    Packs/day: 0.50    Years: 25.00    Pack years: 12.50    Types: Cigarettes   Smokeless tobacco: Never   Tobacco comments:    Currently on the Nicoderm patch."smokes a few"  Vaping Use   Vaping Use: Never used  Substance and Sexual Activity   Alcohol use: Yes    Comment: occasionally   Drug use: No   Sexual activity: Not Currently  Other Topics Concern   Not on file  Social History Narrative   Not on file   Social Determinants of Health   Financial Resource Strain: Not on file  Food Insecurity: Not on file  Transportation Needs: Not on file  Physical Activity: Not on file  Stress: Not on file  Social Connections: Not on file  Intimate Partner Violence: Not on file    Allergies  Allergen Reactions   Diclofenac Anaphylaxis   Codeine Itching   Hydrocodone Itching   Ciprofloxacin Rash    Current Outpatient Medications  Medication Sig Dispense Refill   acetaminophen (TYLENOL) 500 MG tablet Take 2 tablets (1,000 mg total) by mouth every 6 (six) hours as needed for mild pain. 30 tablet 0   albuterol (PROVENTIL HFA;VENTOLIN HFA) 108 (90 Base) MCG/ACT inhaler Inhale 2 puffs into the lungs every 6 (six) hours as needed for wheezing or shortness of breath. 1 Inhaler 0   amLODipine (NORVASC) 5 MG tablet TAKE 1 TABLET BY MOUTH EVERY DAY 90 tablet 3   atorvastatin (LIPITOR) 20 MG tablet Take 20 mg by mouth at bedtime.     cetirizine (ZYRTEC) 10 MG tablet Take 1 tablet (10 mg total) by mouth daily. 90 tablet 0   cilostazol (PLETAL) 100 MG tablet Take 100 mg by mouth 2 (two) times daily.     cyclobenzaprine (FLEXERIL) 10 MG tablet Take 0.5 tablets (5 mg total) by mouth at bedtime. 20 tablet 0   docusate sodium (COLACE) 100 MG capsule Take 1 capsule (100 mg total) by mouth 2 (two) times daily.      EPINEPHrine (EPIPEN 2-PAK) 0.3 mg/0.3 mL DEVI Inject 0.3 mLs (0.3 mg total) into the muscle once as needed (for severe allergic reaction). CAll 911 immediately if you have to use this medicine 1 Device 1   LORazepam (ATIVAN) 0.5 MG tablet Take 0.5 mg by mouth daily as needed for anxiety.     losartan-hydrochlorothiazide (HYZAAR) 100-12.5 MG tablet Take 1 tablet by mouth daily.     magnesium hydroxide (MILK OF MAGNESIA) 400 MG/5ML suspension Take 15 mLs by mouth daily as needed for mild constipation.  metoprolol tartrate (LOPRESSOR) 25 MG tablet TAKE 1 TABLET (25 MG TOTAL) BY MOUTH 2 (TWO) TIMES DAILY. PT NEEDS TO MAKE APPT WITH PROVIDER FOR FURTHER REFILLS - 1ST ATTEMPT 90 tablet 0   montelukast (SINGULAIR) 10 MG tablet Take 10 mg by mouth at bedtime.     nicotine (NICODERM CQ - DOSED IN MG/24 HOURS) 21 mg/24hr patch Place 21 mg onto the skin daily.     nicotine polacrilex (NICORETTE) 4 MG gum Take 4 mg by mouth as needed for smoking cessation.     nitroGLYCERIN (NITROSTAT) 0.4 MG SL tablet Place 1 tablet (0.4 mg total) under the tongue every 5 (five) minutes x 3 doses as needed for chest pain. 25 tablet 2   promethazine (PHENERGAN) 12.5 MG tablet Take 1 tablet (12.5 mg total) by mouth every 4 (four) hours as needed for nausea or vomiting. 15 tablet 0   traMADol (ULTRAM) 50 MG tablet Take 1-2 tablets (50-100 mg total) by mouth every 6 (six) hours as needed for moderate pain. 20 tablet 0   No current facility-administered medications for this visit.    REVIEW OF SYSTEMS:  [X]  denotes positive finding, [ ]  denotes negative finding Cardiac  Comments:  Chest pain or chest pressure:    Shortness of breath upon exertion:    Short of breath when lying flat:    Irregular heart rhythm:        Vascular    Pain in calf, thigh, or hip brought on by ambulation:    Pain in feet at night that wakes you up from your sleep:     Blood clot in your veins:    Leg swelling:         Pulmonary    Oxygen at  home:    Productive cough:     Wheezing:         Neurologic    Sudden weakness in arms or legs:     Sudden numbness in arms or legs:     Sudden onset of difficulty speaking or slurred speech:    Temporary loss of vision in one eye:     Problems with dizziness:         Gastrointestinal    Blood in stool:     Vomited blood:         Genitourinary    Burning when urinating:     Blood in urine:        Psychiatric    Major depression:         Hematologic    Bleeding problems:    Problems with blood clotting too easily:        Skin    Rashes or ulcers:        Constitutional    Fever or chills:      PHYSICAL EXAM There were no vitals filed for this visit.   Constitutional: well appearing. no distress. Appears well nourished.  Neurologic: CN intact. no focal findings. no sensory loss. Psychiatric:  Mood and affect symmetric and appropriate. Eyes:  No icterus. No conjunctival pallor. Ears, nose, throat:  mucous membranes moist. Midline trachea.  Cardiac: regular rate and rhythm.  Respiratory:  unlabored. Abdominal:  soft, non-tender, non-distended.  Peripheral vascular: no palpable pedal or popliteal pulses Extremity: no edema. no cyanosis. no pallor.  Skin: no gangrene. no ulceration.  Lymphatic: no Stemmer's sign. no palpable lymphadenopathy.  PERTINENT LABORATORY AND RADIOLOGIC DATA  Most recent CBC CBC Latest Ref Rng & Units 03/10/2021 03/09/2021 02/28/2021  WBC 4.0 - 10.5 K/uL - - 6.8  Hemoglobin 12.0 - 15.0 g/dL 13.5 14.1 13.6  Hematocrit 36.0 - 46.0 % 40.2 43.4 41.5  Platelets 150 - 400 K/uL - - 296     Most recent CMP CMP Latest Ref Rng & Units 03/11/2021 03/10/2021 02/28/2021  Glucose 70 - 99 mg/dL 117(H) 157(H) 96  BUN 8 - 23 mg/dL 15 17 22   Creatinine 0.44 - 1.00 mg/dL 1.43(H) 1.43(H) 0.96  Sodium 135 - 145 mmol/L 134(L) 135 137  Potassium 3.5 - 5.1 mmol/L 4.6 4.6 4.1  Chloride 98 - 111 mmol/L 98 102 104  CO2 22 - 32 mmol/L 27 25 24   Calcium 8.9 - 10.3  mg/dL 8.7(L) 8.7(L) 9.5  Total Protein 6.0 - 8.3 g/dL - - -  Total Bilirubin 0.3 - 1.2 mg/dL - - -  Alkaline Phos 39 - 117 U/L - - -  AST 0 - 37 U/L - - -  ALT 0 - 35 U/L - - -    Renal function CrCl cannot be calculated (Patient's most recent lab result is older than the maximum 21 days allowed.).  Hgb A1c MFr Bld (%)  Date Value  03/29/2013 6.0 (H)    LDL Cholesterol  Date Value Ref Range Status  03/30/2013 159 (H) 0 - 99 mg/dL Final    Comment:           Total Cholesterol/HDL:CHD Risk Coronary Heart Disease Risk Table                     Men   Women  1/2 Average Risk   3.4   3.3  Average Risk       5.0   4.4  2 X Average Risk   9.6   7.1  3 X Average Risk  23.4   11.0        Use the calculated Patient Ratio above and the CHD Risk Table to determine the patient's CHD Risk.        ATP III CLASSIFICATION (LDL):  <100     mg/dL   Optimal  100-129  mg/dL   Near or Above                    Optimal  130-159  mg/dL   Borderline  160-189  mg/dL   High  >190     mg/dL   Very High     Vascular Imaging:  +-------+-----------+-----------+------------+------------+   ABI/TBI Today's ABI Today's TBI Previous ABI Previous TBI   +-------+-----------+-----------+------------+------------+   Right   0.58        0.00        0.48         0.00           +-------+-----------+-----------+------------+------------+   Left    0.88        0.40        1.13         0.41           +-------+-----------+-----------+------------+------------+    Yevonne Aline. Stanford Breed, MD [facility] Phone Number: [phone] 06/26/2021 12:01 PM  Total time spent on preparing this encounter including chart review, data review, collecting history, examining the patient, coordinating care for this established patient, 20 minutes  Portions of this report may have been transcribed using voice recognition software.  Every effort has been made to ensure accuracy; however,  inadvertent computerized transcription errors may still  be present.

## 2021-06-27 ENCOUNTER — Ambulatory Visit (HOSPITAL_COMMUNITY)
Admission: RE | Admit: 2021-06-27 | Discharge: 2021-06-27 | Disposition: A | Discharge status: Home / Self Care | Payer: Medicare Other | Source: Ambulatory Visit | Attending: Family Medicine | Admitting: Family Medicine

## 2021-06-27 ENCOUNTER — Ambulatory Visit: Payer: Medicare Other | Admitting: Vascular Surgery

## 2021-06-27 ENCOUNTER — Encounter: Payer: Self-pay | Admitting: Vascular Surgery

## 2021-06-27 ENCOUNTER — Other Ambulatory Visit: Payer: Self-pay

## 2021-06-27 VITALS — BP 109/63 | HR 68 | Temp 98.4°F | Resp 20 | Ht 68.0 in | Wt 199.0 lb

## 2021-06-27 DIAGNOSIS — I739 Peripheral vascular disease, unspecified: Secondary | ICD-10-CM | POA: Diagnosis not present

## 2021-06-27 DIAGNOSIS — I7389 Other specified peripheral vascular diseases: Secondary | ICD-10-CM | POA: Diagnosis not present

## 2021-06-28 ENCOUNTER — Other Ambulatory Visit: Payer: Self-pay

## 2021-06-28 DIAGNOSIS — I739 Peripheral vascular disease, unspecified: Secondary | ICD-10-CM

## 2021-07-11 DIAGNOSIS — M25511 Pain in right shoulder: Secondary | ICD-10-CM | POA: Diagnosis not present

## 2021-07-14 DIAGNOSIS — C652 Malignant neoplasm of left renal pelvis: Secondary | ICD-10-CM | POA: Diagnosis not present

## 2021-07-17 DIAGNOSIS — C652 Malignant neoplasm of left renal pelvis: Secondary | ICD-10-CM | POA: Diagnosis not present

## 2021-07-18 DIAGNOSIS — M25511 Pain in right shoulder: Secondary | ICD-10-CM | POA: Diagnosis not present

## 2021-07-21 DIAGNOSIS — C652 Malignant neoplasm of left renal pelvis: Secondary | ICD-10-CM | POA: Diagnosis not present

## 2021-07-21 DIAGNOSIS — C678 Malignant neoplasm of overlapping sites of bladder: Secondary | ICD-10-CM | POA: Diagnosis not present

## 2021-08-07 ENCOUNTER — Other Ambulatory Visit: Payer: Self-pay | Admitting: Urology

## 2021-08-17 ENCOUNTER — Other Ambulatory Visit: Payer: Self-pay

## 2021-08-17 ENCOUNTER — Encounter (HOSPITAL_BASED_OUTPATIENT_CLINIC_OR_DEPARTMENT_OTHER): Payer: Self-pay | Admitting: Urology

## 2021-08-17 DIAGNOSIS — C678 Malignant neoplasm of overlapping sites of bladder: Secondary | ICD-10-CM | POA: Diagnosis not present

## 2021-08-17 NOTE — Progress Notes (Signed)
Spoke w/ via phone for pre-op interview--- [employer]----    ISTAT           Lab results------ Current EKG in Epic dated 01/2021. COVID test -----patient states asymptomatic no test needed Arrive at -------0630 NPO after MN NO Solid Food.   Med rec completed Medications to take morning of surgery -----Norvasc, Metoprolol and bring Albuterol inhaler. Diabetic medication ----- Patient instructed no nail polish to be worn day of surgery Patient instructed to bring photo id and insurance card day of surgery Patient aware to have Driver (ride )  Husband Morgan Torres/ caregiver    for 24 hours after surgery  Patient Special Instructions -----Bring CPAP day of surgery. Pre-Op special Istructions ----- Patient verbalized understanding of instructions that were given at this phone interview. Patient denies shortness of breath, chest pain, fever, cough at this phone interview.

## 2021-08-22 DIAGNOSIS — Z720 Tobacco use: Secondary | ICD-10-CM | POA: Diagnosis not present

## 2021-08-22 DIAGNOSIS — I10 Essential (primary) hypertension: Secondary | ICD-10-CM | POA: Diagnosis not present

## 2021-08-22 DIAGNOSIS — J45998 Other asthma: Secondary | ICD-10-CM | POA: Diagnosis not present

## 2021-08-22 DIAGNOSIS — J449 Chronic obstructive pulmonary disease, unspecified: Secondary | ICD-10-CM | POA: Diagnosis not present

## 2021-08-23 ENCOUNTER — Encounter (HOSPITAL_BASED_OUTPATIENT_CLINIC_OR_DEPARTMENT_OTHER)
Admission: RE | Disposition: A | Discharge status: Home / Self Care | Payer: Self-pay | Source: Ambulatory Visit | Attending: Urology

## 2021-08-23 ENCOUNTER — Other Ambulatory Visit (HOSPITAL_COMMUNITY): Payer: Self-pay

## 2021-08-23 ENCOUNTER — Ambulatory Visit (HOSPITAL_BASED_OUTPATIENT_CLINIC_OR_DEPARTMENT_OTHER): Payer: Medicare Other | Admitting: Anesthesiology

## 2021-08-23 ENCOUNTER — Encounter (HOSPITAL_BASED_OUTPATIENT_CLINIC_OR_DEPARTMENT_OTHER): Payer: Self-pay | Admitting: Urology

## 2021-08-23 ENCOUNTER — Ambulatory Visit (HOSPITAL_BASED_OUTPATIENT_CLINIC_OR_DEPARTMENT_OTHER)
Admission: RE | Admit: 2021-08-23 | Discharge: 2021-08-23 | Disposition: A | Discharge status: Home / Self Care | Payer: Medicare Other | Source: Ambulatory Visit | Attending: Urology | Admitting: Urology

## 2021-08-23 DIAGNOSIS — I10 Essential (primary) hypertension: Secondary | ICD-10-CM | POA: Insufficient documentation

## 2021-08-23 DIAGNOSIS — D759 Disease of blood and blood-forming organs, unspecified: Secondary | ICD-10-CM | POA: Insufficient documentation

## 2021-08-23 DIAGNOSIS — G4730 Sleep apnea, unspecified: Secondary | ICD-10-CM | POA: Diagnosis not present

## 2021-08-23 DIAGNOSIS — D090 Carcinoma in situ of bladder: Secondary | ICD-10-CM | POA: Diagnosis not present

## 2021-08-23 DIAGNOSIS — Z803 Family history of malignant neoplasm of breast: Secondary | ICD-10-CM | POA: Diagnosis not present

## 2021-08-23 DIAGNOSIS — I2510 Atherosclerotic heart disease of native coronary artery without angina pectoris: Secondary | ICD-10-CM | POA: Insufficient documentation

## 2021-08-23 DIAGNOSIS — C679 Malignant neoplasm of bladder, unspecified: Secondary | ICD-10-CM | POA: Insufficient documentation

## 2021-08-23 DIAGNOSIS — Z8553 Personal history of malignant neoplasm of renal pelvis: Secondary | ICD-10-CM | POA: Insufficient documentation

## 2021-08-23 DIAGNOSIS — J449 Chronic obstructive pulmonary disease, unspecified: Secondary | ICD-10-CM | POA: Insufficient documentation

## 2021-08-23 DIAGNOSIS — Z905 Acquired absence of kidney: Secondary | ICD-10-CM | POA: Insufficient documentation

## 2021-08-23 DIAGNOSIS — I252 Old myocardial infarction: Secondary | ICD-10-CM

## 2021-08-23 DIAGNOSIS — D649 Anemia, unspecified: Secondary | ICD-10-CM | POA: Diagnosis not present

## 2021-08-23 DIAGNOSIS — F419 Anxiety disorder, unspecified: Secondary | ICD-10-CM | POA: Insufficient documentation

## 2021-08-23 DIAGNOSIS — F17210 Nicotine dependence, cigarettes, uncomplicated: Secondary | ICD-10-CM | POA: Insufficient documentation

## 2021-08-23 DIAGNOSIS — D494 Neoplasm of unspecified behavior of bladder: Secondary | ICD-10-CM | POA: Diagnosis not present

## 2021-08-23 DIAGNOSIS — M797 Fibromyalgia: Secondary | ICD-10-CM | POA: Diagnosis not present

## 2021-08-23 HISTORY — PX: TRANSURETHRAL RESECTION OF BLADDER TUMOR: SHX2575

## 2021-08-23 LAB — POCT I-STAT, CHEM 8
BUN: 31 mg/dL — ABNORMAL HIGH (ref 8–23)
Calcium, Ion: 1.11 mmol/L — ABNORMAL LOW (ref 1.15–1.40)
Chloride: 109 mmol/L (ref 98–111)
Creatinine, Ser: 1.5 mg/dL — ABNORMAL HIGH (ref 0.44–1.00)
Glucose, Bld: 107 mg/dL — ABNORMAL HIGH (ref 70–99)
HCT: 40 % (ref 36.0–46.0)
Hemoglobin: 13.6 g/dL (ref 12.0–15.0)
Potassium: 4.3 mmol/L (ref 3.5–5.1)
Sodium: 138 mmol/L (ref 135–145)
TCO2: 24 mmol/L (ref 22–32)

## 2021-08-23 SURGERY — TURBT (TRANSURETHRAL RESECTION OF BLADDER TUMOR)
Anesthesia: General | Site: Bladder

## 2021-08-23 MED ORDER — STERILE WATER FOR IRRIGATION IR SOLN
Status: DC | PRN
  Administered 2021-08-23: 500 mL via INTRAVESICAL

## 2021-08-23 MED ORDER — OXYCODONE-ACETAMINOPHEN 5-325 MG PO TABS
1.0000 | ORAL_TABLET | ORAL | 0 refills | Status: DC | PRN
  Filled 2021-08-23: qty 12, 2d supply, fill #0

## 2021-08-23 MED ORDER — FENTANYL CITRATE (PF) 100 MCG/2ML IJ SOLN
INTRAMUSCULAR | Status: DC | PRN
  Administered 2021-08-23: 50 ug via INTRAVENOUS
  Administered 2021-08-23: 25 ug via INTRAVENOUS

## 2021-08-23 MED ORDER — CEFAZOLIN SODIUM-DEXTROSE 2-4 GM/100ML-% IV SOLN
2.0000 g | Freq: Once | INTRAVENOUS | Status: AC
  Administered 2021-08-23: 2 g via INTRAVENOUS

## 2021-08-23 MED ORDER — GLYCOPYRROLATE PF 0.2 MG/ML IJ SOSY
PREFILLED_SYRINGE | INTRAMUSCULAR | Status: AC
  Filled 2021-08-23: qty 1

## 2021-08-23 MED ORDER — 0.9 % SODIUM CHLORIDE (POUR BTL) OPTIME
TOPICAL | Status: DC | PRN
  Administered 2021-08-23: 500 mL

## 2021-08-23 MED ORDER — ONDANSETRON HCL 4 MG/2ML IJ SOLN
INTRAMUSCULAR | Status: AC
  Filled 2021-08-23: qty 2

## 2021-08-23 MED ORDER — SUGAMMADEX SODIUM 200 MG/2ML IV SOLN
INTRAVENOUS | Status: DC | PRN
  Administered 2021-08-23: 200 mg via INTRAVENOUS

## 2021-08-23 MED ORDER — FENTANYL CITRATE (PF) 100 MCG/2ML IJ SOLN: 25.0000 ug | INTRAMUSCULAR | Status: DC | PRN

## 2021-08-23 MED ORDER — MIDAZOLAM HCL 5 MG/5ML IJ SOLN
INTRAMUSCULAR | Status: DC | PRN
  Administered 2021-08-23: 1 mg via INTRAVENOUS

## 2021-08-23 MED ORDER — ACETAMINOPHEN 500 MG PO TABS
ORAL_TABLET | ORAL | Status: AC
  Filled 2021-08-23: qty 2

## 2021-08-23 MED ORDER — AMISULPRIDE (ANTIEMETIC) 5 MG/2ML IV SOLN: 10.0000 mg | Freq: Once | INTRAVENOUS | Status: DC | PRN

## 2021-08-23 MED ORDER — LIDOCAINE 2% (20 MG/ML) 5 ML SYRINGE
INTRAMUSCULAR | Status: DC | PRN
Start: 2021-08-23 — End: 2021-08-23
  Administered 2021-08-23: 60 mg via INTRAVENOUS

## 2021-08-23 MED ORDER — SODIUM CHLORIDE 0.9 % IR SOLN
Status: DC | PRN
Start: 2021-08-23 — End: 2021-08-23
  Administered 2021-08-23 (×2): 3000 mL via INTRAVESICAL
  Administered 2021-08-23: 6000 mL via INTRAVESICAL

## 2021-08-23 MED ORDER — EPHEDRINE 5 MG/ML INJ
INTRAVENOUS | Status: AC
  Filled 2021-08-23: qty 5

## 2021-08-23 MED ORDER — LACTATED RINGERS IV SOLN: INTRAVENOUS | Status: DC

## 2021-08-23 MED ORDER — ROCURONIUM BROMIDE 10 MG/ML (PF) SYRINGE
PREFILLED_SYRINGE | INTRAVENOUS | Status: DC | PRN
  Administered 2021-08-23: 50 mg via INTRAVENOUS

## 2021-08-23 MED ORDER — GEMCITABINE CHEMO FOR BLADDER INSTILLATION 2000 MG
2000.0000 mg | Freq: Once | INTRAVENOUS | Status: AC
  Administered 2021-08-23: 2000 mg via INTRAVESICAL
  Filled 2021-08-23: qty 2000

## 2021-08-23 MED ORDER — PHENYLEPHRINE 40 MCG/ML (10ML) SYRINGE FOR IV PUSH (FOR BLOOD PRESSURE SUPPORT)
PREFILLED_SYRINGE | INTRAVENOUS | Status: DC | PRN
  Administered 2021-08-23: 80 ug via INTRAVENOUS
  Administered 2021-08-23: 120 ug via INTRAVENOUS
  Administered 2021-08-23: 80 ug via INTRAVENOUS

## 2021-08-23 MED ORDER — GLYCOPYRROLATE PF 0.2 MG/ML IJ SOSY
PREFILLED_SYRINGE | INTRAMUSCULAR | Status: DC | PRN
  Administered 2021-08-23: .2 mg via INTRAVENOUS

## 2021-08-23 MED ORDER — OXYCODONE HCL 5 MG/5ML PO SOLN: 5.0000 mg | Freq: Once | ORAL | Status: DC | PRN

## 2021-08-23 MED ORDER — PHENYLEPHRINE 40 MCG/ML (10ML) SYRINGE FOR IV PUSH (FOR BLOOD PRESSURE SUPPORT)
PREFILLED_SYRINGE | INTRAVENOUS | Status: AC
  Filled 2021-08-23: qty 10

## 2021-08-23 MED ORDER — PROPOFOL 10 MG/ML IV BOLUS
INTRAVENOUS | Status: AC
  Filled 2021-08-23: qty 20

## 2021-08-23 MED ORDER — DEXAMETHASONE SODIUM PHOSPHATE 10 MG/ML IJ SOLN
INTRAMUSCULAR | Status: DC | PRN
  Administered 2021-08-23: 5 mg via INTRAVENOUS

## 2021-08-23 MED ORDER — EPHEDRINE SULFATE-NACL 50-0.9 MG/10ML-% IV SOSY
PREFILLED_SYRINGE | INTRAVENOUS | Status: DC | PRN
  Administered 2021-08-23: 5 mg via INTRAVENOUS
  Administered 2021-08-23: 10 mg via INTRAVENOUS

## 2021-08-23 MED ORDER — MIDAZOLAM HCL 2 MG/2ML IJ SOLN
INTRAMUSCULAR | Status: AC
  Filled 2021-08-23: qty 2

## 2021-08-23 MED ORDER — OXYBUTYNIN CHLORIDE 5 MG PO TABS
5.0000 mg | ORAL_TABLET | Freq: Three times a day (TID) | ORAL | 1 refills | Status: DC | PRN
  Filled 2021-08-23: qty 30, 10d supply, fill #0

## 2021-08-23 MED ORDER — ONDANSETRON HCL 4 MG/2ML IJ SOLN
INTRAMUSCULAR | Status: DC | PRN
  Administered 2021-08-23: 4 mg via INTRAVENOUS

## 2021-08-23 MED ORDER — DEXAMETHASONE SODIUM PHOSPHATE 10 MG/ML IJ SOLN
INTRAMUSCULAR | Status: AC
  Filled 2021-08-23: qty 1

## 2021-08-23 MED ORDER — OXYCODONE HCL 5 MG PO TABS: 5.0000 mg | ORAL_TABLET | Freq: Once | ORAL | Status: DC | PRN

## 2021-08-23 MED ORDER — PHENAZOPYRIDINE HCL 200 MG PO TABS
200.0000 mg | ORAL_TABLET | Freq: Three times a day (TID) | ORAL | 0 refills | Status: AC | PRN
  Filled 2021-08-23: qty 30, 10d supply, fill #0

## 2021-08-23 MED ORDER — ACETAMINOPHEN 500 MG PO TABS
1000.0000 mg | ORAL_TABLET | Freq: Once | ORAL | Status: AC
  Administered 2021-08-23: 1000 mg via ORAL

## 2021-08-23 MED ORDER — PROPOFOL 10 MG/ML IV BOLUS
INTRAVENOUS | Status: DC | PRN
  Administered 2021-08-23: 150 mg via INTRAVENOUS

## 2021-08-23 MED ORDER — CEFAZOLIN SODIUM-DEXTROSE 2-4 GM/100ML-% IV SOLN
INTRAVENOUS | Status: AC
  Filled 2021-08-23: qty 100

## 2021-08-23 MED ORDER — SODIUM CHLORIDE (PF) 0.9 % IJ SOLN
INTRAMUSCULAR | Status: DC | PRN
  Administered 2021-08-23: 7 mL

## 2021-08-23 MED ORDER — FENTANYL CITRATE (PF) 100 MCG/2ML IJ SOLN
INTRAMUSCULAR | Status: AC
  Filled 2021-08-23: qty 2

## 2021-08-23 SURGICAL SUPPLY — 23 items
BAG DRAIN URO-CYSTO SKYTR STRL (DRAIN) ×2 IMPLANT
BAG URINE DRAIN 2000ML AR STRL (UROLOGICAL SUPPLIES) ×1 IMPLANT
BAG URINE LEG 500ML (DRAIN) IMPLANT
CATH FOLEY 2WAY SLVR  5CC 16FR (CATHETERS) ×2
CATH FOLEY 2WAY SLVR 5CC 16FR (CATHETERS) IMPLANT
CATH URET 5FR 28IN OPEN ENDED (CATHETERS) ×1 IMPLANT
GLOVE SURG ENC MOIS LTX SZ7.5 (GLOVE) ×2 IMPLANT
GOWN STRL REUS W/TWL XL LVL3 (GOWN DISPOSABLE) ×2 IMPLANT
GUIDEWIRE ZIPWRE .038 STRAIGHT (WIRE) ×1 IMPLANT
HOLDER FOLEY CATH W/STRAP (MISCELLANEOUS) ×1 IMPLANT
IV NS IRRIG 3000ML ARTHROMATIC (IV SOLUTION) ×2 IMPLANT
KIT TURNOVER CYSTO (KITS) ×2 IMPLANT
LOOP CUT BIPOLAR 24F LRG (ELECTROSURGICAL) IMPLANT
MANIFOLD NEPTUNE II (INSTRUMENTS) ×2 IMPLANT
NS IRRIG 500ML POUR BTL (IV SOLUTION) ×1 IMPLANT
PACK CYSTO (CUSTOM PROCEDURE TRAY) ×2 IMPLANT
STENT URET 6FRX24 CONTOUR (STENTS) ×1 IMPLANT
SYR TOOMEY IRRIG 70ML (MISCELLANEOUS)
SYRINGE TOOMEY IRRIG 70ML (MISCELLANEOUS) IMPLANT
TUBE CONNECTING 12X1/4 (SUCTIONS) ×2 IMPLANT
TUBING UROLOGY SET (TUBING) ×2 IMPLANT
WATER STERILE IRR 3000ML UROMA (IV SOLUTION) ×3 IMPLANT
WATER STERILE IRR 500ML POUR (IV SOLUTION) ×1 IMPLANT

## 2021-08-23 NOTE — Anesthesia Preprocedure Evaluation (Signed)
Anesthesia Evaluation  ?Patient identified by MRN, date of birth, ID band ?Patient awake ? ? ? ?Reviewed: ?Allergy & Precautions, NPO status , Patient's Chart, lab work & pertinent test results, reviewed documented beta blocker date and time  ? ?Airway ?Mallampati: II ? ?TM Distance: >3 FB ?Neck ROM: Full ? ? ? Dental ? ?(+) Edentulous Upper, Dental Advisory Given, Poor Dentition, Missing,  ?  ?Pulmonary ?asthma , sleep apnea and Continuous Positive Airway Pressure Ventilation , pneumonia, resolved, COPD,  COPD inhaler, Current Smoker and Patient abstained from smoking.,  ?  ?Pulmonary exam normal ?breath sounds clear to auscultation ? ? ? ? ? ? Cardiovascular ?hypertension, Pt. on medications and Pt. on home beta blockers ?+ CAD and + Past MI  ?Normal cardiovascular exam ?Rhythm:Regular Rate:Normal ? ?Hx/o Non ischemic CM ?Hx/o MI no stents ? ?EKG 01/26/21 ?SB, low voltage ? ?Echo 02/02/21 ?1. Left ventricular ejection fraction, by estimation, is 60 to 65%. Theleft ventricle has normal function. The left ventricle has no regional wall motion abnormalities. There is mild left ventricular hypertrophy. Left ventricular diastolic parameters were normal. The average left ventricular global longitudinal strain is -18.4 %. The global longitudinal strain is normal.  ??2. Right ventricular systolic function is normal. The right ventricular size is normal.  ??3. The mitral valve is abnormal. Trivial mitral valve regurgitation. No evidence of mitral stenosis.  ??4. The aortic valve is tricuspid. Aortic valve regurgitation is not visualized. Mild to moderate aortic valve sclerosis/calcification is present, without any evidence of aortic stenosis.  ??5. The inferior vena cava is normal in size with greater than 50% respiratory variability, suggesting right atrial pressure of 3 mmHg.  ? ?Myocardia perfusion study 01/31/21 ?The left ventricular ejection fraction is hyperdynamic (>65%). ?Nuclear stress  EF: 68%. ?There was no ST segment deviation noted during stress. ?The study is normal. ?This is a low risk study. ? ?  ?Neuro/Psych ?PSYCHIATRIC DISORDERS Anxiety Depression  Neuromuscular disease   ? GI/Hepatic ?negative GI ROS, Neg liver ROS,   ?Endo/Other  ?Hyperlipidemia ? Renal/GU ?Renal diseaseLeft renal pelvic mass s/p nephroureterectomy  ?negative genitourinary ?  ?Musculoskeletal ? ?(+) Arthritis , Osteoarthritis,  Fibromyalgia - ? Abdominal ?(+) + obese,   ?Peds ? Hematology ? ?(+) Blood dyscrasia, anemia ,   ?Anesthesia Other Findings ? ? Reproductive/Obstetrics ? ?  ? ? ? ? ? ? ? ? ? ? ? ? ? ?  ?  ? ? ? ? ? ? ? ? ?Anesthesia Physical ? ?Anesthesia Plan ? ?ASA: 3 ? ?Anesthesia Plan: General  ? ?Post-op Pain Management: Tylenol PO (pre-op)* and Minimal or no pain anticipated  ? ?Induction: Intravenous ? ?PONV Risk Score and Plan: 4 or greater and Treatment may vary due to age or medical condition, Ondansetron and Dexamethasone ? ?Airway Management Planned: LMA ? ?Additional Equipment:  ? ?Intra-op Plan:  ? ?Post-operative Plan: Extubation in OR ? ?Informed Consent: I have reviewed the patients History and Physical, chart, labs and discussed the procedure including the risks, benefits and alternatives for the proposed anesthesia with the patient or authorized representative who has indicated his/her understanding and acceptance.  ? ? ? ?Dental advisory given ? ?Plan Discussed with: CRNA and Anesthesiologist ? ?Anesthesia Plan Comments:   ? ? ? ? ? ? ?Anesthesia Quick Evaluation ? ?

## 2021-08-23 NOTE — Transfer of Care (Signed)
Immediate Anesthesia Transfer of Care Note ? ?Patient: Morgan Torres ? ?Procedure(s) Performed: TRANSURETHRAL RESECTION OF BLADDER TUMOR (TURBT) WITH CYSTOSCOPY/ POSTOPERATIVE INSTILLATION OF GEMCITABINE (Bladder) ? ?Patient Location: PACU ? ?Anesthesia Type:General ? ?Level of Consciousness: awake, alert , oriented and patient cooperative ? ?Airway & Oxygen Therapy: Patient Spontanous Breathing ? ?Post-op Assessment: Report given to RN and Post -op Vital signs reviewed and stable ? ?Post vital signs: Reviewed and stable ? ?Last Vitals:  ?Vitals Value Taken Time  ?BP 115/54 08/23/21 0938  ?Temp    ?Pulse    ?Resp 22 08/23/21 0940  ?SpO2    ?Vitals shown include unvalidated device data. ? ?Last Pain:  ?Vitals:  ? 08/23/21 0700  ?TempSrc: Oral  ?PainSc: 0-No pain  ?   ? ?Patients Stated Pain Goal: 4 (08/23/21 0700) ? ?Complications: No notable events documented. ?

## 2021-08-23 NOTE — Discharge Instructions (Signed)
  Post Anesthesia Home Care Instructions  Activity: Get plenty of rest for the remainder of the day. A responsible individual must stay with you for 24 hours following the procedure.  For the next 24 hours, DO NOT: -Drive a car -Operate machinery -Drink alcoholic beverages -Take any medication unless instructed by your physician -Make any legal decisions or sign important papers.  Meals: Start with liquid foods such as gelatin or soup. Progress to regular foods as tolerated. Avoid greasy, spicy, heavy foods. If nausea and/or vomiting occur, drink only clear liquids until the nausea and/or vomiting subsides. Call your physician if vomiting continues.  Special Instructions/Symptoms: Your throat may feel dry or sore from the anesthesia or the breathing tube placed in your throat during surgery. If this causes discomfort, gargle with warm salt water. The discomfort should disappear within 24 hours.  CYSTOSCOPY HOME CARE INSTRUCTIONS  Activity: Rest for the remainder of the day.  Do not drive or operate equipment today.  You may resume normal activities in one to two days as instructed by your physician.   Meals: Drink plenty of liquids and eat light foods such as gelatin or soup this evening.  You may return to a normal meal plan tomorrow.  Return to Work: You may return to work in one to two days or as instructed by your physician.  Special Instructions / Symptoms: Call your physician if any of these symptoms occur:   -persistent or heavy bleeding  -bleeding which continues after first few urination  -large blood clots that are difficult to pass  -urine stream diminishes or stops completely  -fever equal to or higher than 101 degrees Farenheit.  -cloudy urine with a strong, foul odor  -severe pain  Females should always wipe from front to back after elimination.  You may feel some burning pain when you urinate.  This should disappear with time.  Applying moist heat to the lower  abdomen or a hot tub bath may help relieve the pain.   

## 2021-08-23 NOTE — Anesthesia Postprocedure Evaluation (Signed)
Anesthesia Post Note ? ?Patient: Morgan Torres ? ?Procedure(s) Performed: TRANSURETHRAL RESECTION OF BLADDER TUMOR (TURBT) WITH CYSTOSCOPY/ POSTOPERATIVE INSTILLATION OF GEMCITABINE/retrograde pyelogram and stent placement (right) (Bladder) ? ?  ? ?Patient location during evaluation: PACU ?Anesthesia Type: General ?Level of consciousness: awake and alert ?Pain management: pain level controlled ?Vital Signs Assessment: post-procedure vital signs reviewed and stable ?Respiratory status: spontaneous breathing, nonlabored ventilation, respiratory function stable and patient connected to nasal cannula oxygen ?Cardiovascular status: blood pressure returned to baseline and stable ?Postop Assessment: no apparent nausea or vomiting ?Anesthetic complications: no ? ? ?No notable events documented. ? ?Last Vitals:  ?Vitals:  ? 08/23/21 1100 08/23/21 1115  ?BP: (!) 148/69 (!) 160/65  ?Pulse: 65 63  ?Resp: 15 (!) 21  ?Temp:    ?SpO2:  96%  ?  ?Last Pain:  ?Vitals:  ? 08/23/21 1100  ?TempSrc:   ?PainSc: 0-No pain  ? ? ?  ?  ?  ?  ?  ?  ? ?Suzette Battiest E ? ? ? ? ?

## 2021-08-23 NOTE — H&P (Signed)
PRE-OP H&P  Office Visit Report     08/17/2021   --------------------------------------------------------------------------------   Morgan Torres  MRN: [id number]  DOB: [birth year], 71 year old Female  PRIMARY CARE:  Morgan Beard, MD  REFERRING:  Morgan Beard, MD  PROVIDER:  Louis Torres, M.D.  TREATING:  Morgan Gravel, NP  LOCATION:  [facility] 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   03/17/21: The patient is here today for routine postop visit. She reports a 4 to 5-day history of abdominal bloating and discomfort. She admits that she has not had a bowel movement for several days. She passed a small bowel movement while in the hospital, but has only since passed a hard, round and firm stool since then. She denies nausea/vomiting, fever/chills or incisional pain. She is passing flatus. She reports serous discharge from her lateral port incision, which is where her JP drain exited. Otherwise, she is ambulating without difficulty and has no urinary complaints today. Denies hematuria or dysuria.   04/21/2021: Back today for follow-up visit. Doing well from a urological perspective. Her issues with abdominal bloating and discomfort as well as constipation have all grossly resolved. She is no longer having any significant or notable pain and discomfort in the left flank, lower back or abdomen. Voiding symptoms grossly stable. No changes in baseline frequency/urgency, interval burning or dysuria, no interval gross hematuria. She has had no further issues with wound healing including resolution of drainage from port sites. No interval bleeding from the affected areas. Unfortunately the patient has been having numbness in her right fifth digit as well as numbness in the  right foot. Primary care has referred her to heart and vascular for evaluation which she has an appointment with him early next week. Recent laboratory data assess showed an improvement in creatinine from last assessment here. Decreased from 1.4-1.3 assessed earlier this week. She denies interval fevers or chills, nausea/vomiting. She continues to have fatigue and poor appetite but is trying to stay hydrated and nourished to the best of her ability. She denies interval chest pain, shortness of breath, dyspnea upon exertion.   07/21/21: The patient is here today for a routine follow-up. Recent surveillance CT urogram showed no clear evidence of metastatic disease. She was noted to have some indeterminate left retroperitoneal lymph nodes. She is urinating without difficulty and denies interval UTIs, dysuria or episodes of gross hematuria.   08/17/2021: 71 year old female who presents today for preoperative appointment prior to undergoing a TURBT. She is trying to quit smoking but has not been successful at this. She has a daughter that is undergoing chemotherapy for breast cancer, she is quite tearful today and has some questions regarding her upcoming surgery. 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 She is wondering if she will be able to work postoperatively. She denies interval fevers and chills. No complaints of shortness of breath or chest pain.     ALLERGIES: Codeine NSAIDs    MEDICATIONS: Metoprolol Tartrate 25 mg tablet  Albuterol Sulfate  Amlodipine Besilate  Atorvastatin Calcium 20 mg tablet  Cilostazol  Epinephrine 0.3 mg/0.3 ml auto-injector  Lorazepam 1 mg tablet  Losartan Potassium  Prednisone     GU PSH: Cystoscopy - 07/21/2021 Cystoscopy Insert Stent - 03/09/2021 Lap Nephro Ureterectomy - 03/09/2021  Locm 300-399Mg /Ml Iodine,1Ml - 07/17/2021     NON-GU PSH: No Non-GU PSH    GU PMH: Bladder Cancer overlapping sites - 07/21/2021 Renal pelvis cancer,  left - 07/21/2021, - 07/17/2021, - 04/21/2021, - 03/17/2021, - 01/12/2021 Gross hematuria - 01/23/2021, - 01/12/2021 Left renal neoplasm - 01/23/2021      PMH Notes: Renal ultrasound 11/22/17 revealed normal kidneys bilaterally without mass, stones or hydronephrosis.  Heart attack   NON-GU PMH: Anxiety Asthma Hypertension Sleep Apnea    FAMILY HISTORY: 1 Daughter - Daughter 1 son - Son Death In The Family Mother - Mother Kidney Cancer - Sister   SOCIAL HISTORY: Marital Status: Married Preferred Language: English; Ethnicity: Not Hispanic Or Latino; Race: Black or African American Current Smoking Status: Patient smokes. Has smoked since 12/24/1990. Smokes 1 pack per day.   Tobacco Use Assessment Completed: Used Tobacco in last 30 days? Has never drank.  Drinks 2 caffeinated drinks per day.    REVIEW OF SYSTEMS:    GU Review Female:   Patient reports get up at night to urinate. Patient denies frequent urination, hard to postpone urination, burning /pain with urination, leakage of urine, stream starts and stops, trouble starting your stream, have to strain to urinate, and being pregnant.  Gastrointestinal (Upper):   Patient denies nausea, vomiting, and indigestion/ heartburn.  Gastrointestinal (Lower):   Patient denies diarrhea and constipation.  Constitutional:   Patient denies fever, night sweats, weight loss, and fatigue.  Skin:   Patient denies skin rash/ lesion and itching.  Eyes:   Patient denies blurred vision and double vision.  Ears/ Nose/ Throat:   Patient denies sore throat and sinus problems.  Hematologic/Lymphatic:   Patient denies swollen glands and easy bruising.  Cardiovascular:   Patient denies leg swelling and chest pains.  Musculoskeletal:   Patient denies back pain and joint pain.  Neurological:   Patient denies headaches and dizziness.  Psychologic:   Patient denies depression and anxiety.   VITAL SIGNS:      08/17/2021 08:16 AM  Weight 190 lb / 86.18 kg  Height 68  in / 172.72 cm  BP 127/73 mmHg  Pulse 66 /min  Temperature 97.2 F / 36.2 C  BMI 28.9 kg/m   MULTI-SYSTEM PHYSICAL EXAMINATION:    Constitutional: Well-nourished. No physical deformities. Normally developed. Good grooming.  Respiratory: No labored breathing, no use of accessory muscles.   Cardiovascular: Normal temperature, normal extremity pulses, no swelling, no varicosities.  Skin: No paleness, no jaundice, no cyanosis. No lesion, no ulcer, no rash.  Neurologic / Psychiatric: Oriented to time, oriented to place, oriented to person. No depression, no anxiety, no agitation.  Gastrointestinal: No mass, no tenderness, no rigidity, non obese abdomen.     Complexity of Data:  Source Of History:  Patient  Records Review:   Previous Doctor Records, Previous Patient Records  Urine Test Review:   Urinalysis  X-Ray Review: C.T. Abdomen/Pelvis: Reviewed Films.     08/17/21  Urinalysis  Urine Appearance Clear   Urine Color Yellow   Urine Glucose Neg mg/dL  Urine Bilirubin Neg mg/dL  Urine Ketones Neg mg/dL  Urine Specific Gravity 1.020   Urine Blood Neg ery/uL  Urine pH 5.5   Urine Protein Neg mg/dL  Urine Urobilinogen 0.2 mg/dL  Urine Nitrites Neg   Urine Leukocyte Esterase Neg leu/uL   PROCEDURES:         Flexible Cystoscopy - 52000  Risks, benefits, and some of the potential complications of the procedure were  discussed at length with the patient including infection, bleeding, voiding discomfort, urinary retention, fever, chills, sepsis, and others. All questions were answered. Informed consent was obtained. Antibiotic prophylaxis was given. Sterile technique and intraurethral analgesia were used.  Meatus:  Normal size. Normal location. Normal condition.  Urethra:  No hypermobility. No leakage.  Ureteral Orifices:  Absent left orifice.  Bladder:  Multiple 5 mm papillary bladder tumors were seen throughout the bladder mucosa. There were small tumors adjacent to the right ureteral  orifice as well as adjacent to the previously resected left UO.      The lower urinary tract was carefully examined. The procedure was well-tolerated and without complications. Antibiotic instructions were given. Instructions were given to call the office immediately for bloody urine, difficulty urinating, urinary retention, painful or frequent urination, fever, chills, nausea, vomiting or other illness. The patient stated that she understood these instructions and would comply with them.         Urinalysis Dipstick Dipstick Cont'd  Color: Yellow Bilirubin: Neg mg/dL  Appearance: Clear Ketones: Neg mg/dL  Specific Gravity: 1.020 Blood: Neg ery/uL  pH: 5.5 Protein: Neg mg/dL  Glucose: Neg mg/dL Urobilinogen: 0.2 mg/dL    Nitrites: Neg    Leukocyte Esterase: Neg leu/uL    ASSESSMENT:      ICD-10 Details  1 GU:   Bladder Cancer overlapping sites - C67.8 Chronic, Stable   PLAN:            Medications Stop Meds: Ondansetron Hcl 4 mg tablet 1 tablet PO Q 4 H PRN  Start: 03/29/2021  Discontinue: 08/17/2021  - Reason: The medication cycle was completed.            Document Letter(s):  Created for Patient: Clinical Summary         Notes: The risks, benefits and alternatives of cystoscopy with TURBT with gemcitabine instillation was discussed with the patient. The risks include, but are not limited to, bleeding, urinary tract infection, bladder perforation requiring prolonged catheterization and/or open bladder repair, ureteral obstruction, voiding dysfunction and the inherent risks of general anesthesia. The patient voices understanding and wishes to proceed.

## 2021-08-23 NOTE — Op Note (Signed)
Operative Note ? ?Preoperative diagnosis:  ?1.  Multiple papillary bladder tumors measuring 2 to 10 mm throughout the bladder mucosa ?2.  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 ?Postoperative diagnosis: ?1.  1.  Multiple papillary bladder tumors measuring 2 to 10 mm throughout the bladder mucosa ?2.  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 ?Procedure(s): ?1.  Cystoscopy with TURBT small ?2.  Right ureteral stent placement ?3.  Right retrograde pyelogram with intraoperative interpretation of fluoroscopic imaging ?4.  Intravesical instillation of gemcitabine ? ?Surgeon: Ellison Hughs, MD ? ?Assistants:  None ? ?Anesthesia:  General ? ?Complications:  None ? ?EBL: Less than 5 mL ? ?Specimens: ?1.  Multiple bladder tumors ? ?Drains/Catheters: ?1.  Right 6 French, 24 cm JJ stent with tether ?2.  16 French Foley catheter ? ?Intraoperative findings:   ?Numerous papillary bladder tumors were seen throughout the bladder mucosa.  All of these tumors were extremely superficial and grossly noninvasive.  There was a small tumor immediately adjacent to the right ureteral orifice, but not directly involving it.  Due to the close proximity of the resection, a ureteral stent was placed to prevent ureteral obstruction from edema around the ureteral orifice. ? ?Indication:  Morgan Torres is a 71 y.o. female with a history of pT2 urothelial cell carcinoma of the left renal pelvis, status post left robotic nephroureterectomy.  The patient had a surveillance cystoscopy in January 2023 that revealed numerous papillary bladder tumors throughout the bladder mucosa.  She has been consented for the above procedures, voices understanding and wishes to proceed. ? ?Description of procedure: ? ?After informed consent was obtained, the patient was brought to the operating room  and general endotracheal anesthesia was administered. The patient was then placed in the dorsolithotomy position and prepped and draped in the usual sterile fashion. A timeout was performed. A 23 French rigid cystoscope was then inserted into the urethral meatus and advanced into the bladder under direct vision. A complete bladder survey revealed numerous 2 to 10 mm papillary bladder tumors scattered throughout the bladder mucosa. ? ?The rigid cystoscope was then exchanged for a 26 French resectoscope with a bipolar loop working element.  All of the small and superficial tumors were resected and sent for permanent section and bulk.  The areas of resection were then extensively fulgurated until hemostasis was achieved.  She did have a 5 mm papillary tumor immediately adjacent, but not directly involving, the right ureteral orifice.  Due to the close proximity of this tumor and the right UO, I made the decision to place her ureteral stent. ? ?A 5 French ureteral catheter was then inserted into the right ureteral orifice and a retrograde pyelogram was obtained, with the findings listed above.  A Glidewire was then used to intubate the lumen of the ureteral catheter and was advanced up to the right renal pelvis, under fluoroscopic guidance.  The catheter was then removed, leaving the wire in place.  A 6 French, 24 cm JJ stent was then advanced over the wire and into good position within the right collecting system, confirming placement via fluoroscopy.  Reinspection of the bladder revealed no evidence of bladder perforation.  All tumor fragments were removed. ? ?A 95 French Foley catheter was then placed and set to gravity drainage.  The tether the stent was tucked in the vaginal vault. ? ?  While in the recovery room 2000 mg of gemcitabine in 50 mL of water was instilled in the bladder through the catheter and the catheter was plugged. This will remain indwelling for approximately one hour. It will then be drained from  the bladder and the catheter will be removed and the patient discharged home. ? ?Plan: Remove Foley catheter 1 hour after gemcitabine instillation.  The patient has been instructed to remove her ureteral stent at 7 AM on 08/28/2021. ? ?

## 2021-08-23 NOTE — Anesthesia Procedure Notes (Signed)
Procedure Name: Intubation ?Date/Time: 08/23/2021 8:37 AM ?Performed by: Rogers Blocker, CRNA ?Pre-anesthesia Checklist: Patient identified, Emergency Drugs available, Suction available and Patient being monitored ?Patient Re-evaluated:Patient Re-evaluated prior to induction ?Oxygen Delivery Method: Circle System Utilized ?Preoxygenation: Pre-oxygenation with 100% oxygen ?Induction Type: IV induction ?Ventilation: Mask ventilation without difficulty ?Laryngoscope Size: Mac and 3 ?Grade View: Grade I ?Tube type: Oral ?Tube size: 7.0 mm ?Number of attempts: 1 ?Airway Equipment and Method: Stylet and Bite block ?Placement Confirmation: ETT inserted through vocal cords under direct vision, positive ETCO2 and breath sounds checked- equal and bilateral ?Secured at: 22 cm ?Tube secured with: Tape ?Dental Injury: Teeth and Oropharynx as per pre-operative assessment  ? ? ? ? ?

## 2021-08-24 LAB — SURGICAL PATHOLOGY

## 2021-08-25 ENCOUNTER — Encounter (HOSPITAL_BASED_OUTPATIENT_CLINIC_OR_DEPARTMENT_OTHER): Payer: Self-pay | Admitting: Urology

## 2021-08-30 DIAGNOSIS — G4733 Obstructive sleep apnea (adult) (pediatric): Secondary | ICD-10-CM | POA: Diagnosis not present

## 2021-09-01 DIAGNOSIS — C652 Malignant neoplasm of left renal pelvis: Secondary | ICD-10-CM | POA: Diagnosis not present

## 2021-09-01 DIAGNOSIS — R310 Gross hematuria: Secondary | ICD-10-CM | POA: Diagnosis not present

## 2021-09-01 DIAGNOSIS — C678 Malignant neoplasm of overlapping sites of bladder: Secondary | ICD-10-CM | POA: Diagnosis not present

## 2021-09-05 DIAGNOSIS — Z720 Tobacco use: Secondary | ICD-10-CM | POA: Diagnosis not present

## 2021-09-05 DIAGNOSIS — C678 Malignant neoplasm of overlapping sites of bladder: Secondary | ICD-10-CM | POA: Diagnosis not present

## 2021-09-05 DIAGNOSIS — M79674 Pain in right toe(s): Secondary | ICD-10-CM | POA: Diagnosis not present

## 2021-09-05 DIAGNOSIS — I7389 Other specified peripheral vascular diseases: Secondary | ICD-10-CM | POA: Diagnosis not present

## 2021-09-05 DIAGNOSIS — I10 Essential (primary) hypertension: Secondary | ICD-10-CM | POA: Diagnosis not present

## 2021-09-05 DIAGNOSIS — J449 Chronic obstructive pulmonary disease, unspecified: Secondary | ICD-10-CM | POA: Diagnosis not present

## 2021-09-05 DIAGNOSIS — C652 Malignant neoplasm of left renal pelvis: Secondary | ICD-10-CM | POA: Diagnosis not present

## 2021-09-19 DIAGNOSIS — M79674 Pain in right toe(s): Secondary | ICD-10-CM | POA: Diagnosis not present

## 2021-10-10 DIAGNOSIS — Z5111 Encounter for antineoplastic chemotherapy: Secondary | ICD-10-CM | POA: Diagnosis not present

## 2021-10-10 DIAGNOSIS — C678 Malignant neoplasm of overlapping sites of bladder: Secondary | ICD-10-CM | POA: Diagnosis not present

## 2021-10-17 DIAGNOSIS — Z5111 Encounter for antineoplastic chemotherapy: Secondary | ICD-10-CM | POA: Diagnosis not present

## 2021-10-17 DIAGNOSIS — C678 Malignant neoplasm of overlapping sites of bladder: Secondary | ICD-10-CM | POA: Diagnosis not present

## 2021-10-23 DIAGNOSIS — M79674 Pain in right toe(s): Secondary | ICD-10-CM | POA: Diagnosis not present

## 2021-10-24 DIAGNOSIS — Z5111 Encounter for antineoplastic chemotherapy: Secondary | ICD-10-CM | POA: Diagnosis not present

## 2021-10-24 DIAGNOSIS — C678 Malignant neoplasm of overlapping sites of bladder: Secondary | ICD-10-CM | POA: Diagnosis not present

## 2021-10-31 DIAGNOSIS — C678 Malignant neoplasm of overlapping sites of bladder: Secondary | ICD-10-CM | POA: Diagnosis not present

## 2021-10-31 DIAGNOSIS — Z5111 Encounter for antineoplastic chemotherapy: Secondary | ICD-10-CM | POA: Diagnosis not present

## 2021-11-07 DIAGNOSIS — C678 Malignant neoplasm of overlapping sites of bladder: Secondary | ICD-10-CM | POA: Diagnosis not present

## 2021-11-07 DIAGNOSIS — Z5111 Encounter for antineoplastic chemotherapy: Secondary | ICD-10-CM | POA: Diagnosis not present

## 2021-11-13 DIAGNOSIS — M79674 Pain in right toe(s): Secondary | ICD-10-CM | POA: Diagnosis not present

## 2021-11-14 DIAGNOSIS — C678 Malignant neoplasm of overlapping sites of bladder: Secondary | ICD-10-CM | POA: Diagnosis not present

## 2021-11-14 DIAGNOSIS — Z5111 Encounter for antineoplastic chemotherapy: Secondary | ICD-10-CM | POA: Diagnosis not present

## 2021-11-21 DIAGNOSIS — M79674 Pain in right toe(s): Secondary | ICD-10-CM | POA: Diagnosis not present

## 2021-11-21 DIAGNOSIS — Z0000 Encounter for general adult medical examination without abnormal findings: Secondary | ICD-10-CM | POA: Diagnosis not present

## 2021-12-14 DIAGNOSIS — G4733 Obstructive sleep apnea (adult) (pediatric): Secondary | ICD-10-CM | POA: Diagnosis not present

## 2021-12-25 DIAGNOSIS — C678 Malignant neoplasm of overlapping sites of bladder: Secondary | ICD-10-CM | POA: Diagnosis not present

## 2022-01-01 DIAGNOSIS — C652 Malignant neoplasm of left renal pelvis: Secondary | ICD-10-CM | POA: Diagnosis not present

## 2022-01-03 ENCOUNTER — Encounter: Payer: Self-pay | Admitting: Podiatry

## 2022-01-03 ENCOUNTER — Ambulatory Visit: Payer: Medicare Other | Admitting: Podiatry

## 2022-01-03 DIAGNOSIS — M79674 Pain in right toe(s): Secondary | ICD-10-CM | POA: Diagnosis not present

## 2022-01-03 DIAGNOSIS — I999 Unspecified disorder of circulatory system: Secondary | ICD-10-CM

## 2022-01-03 DIAGNOSIS — B351 Tinea unguium: Secondary | ICD-10-CM

## 2022-01-03 DIAGNOSIS — M79675 Pain in left toe(s): Secondary | ICD-10-CM | POA: Diagnosis not present

## 2022-01-03 NOTE — Progress Notes (Signed)
Subjective:   Patient ID: Morgan Torres, female   DOB: 71 y.o.   MRN: [id number]   HPI Patient presents with nail disease 1-5 both feet with patient who also has circulation issues and sees a doctor for this.  Still smokes a half a pack per day trying to reduce and is not currently active   Review of Systems  All other systems reviewed and are negative.       Objective:  Physical Exam Vitals and nursing note reviewed.  Constitutional:      Appearance: She is well-developed.  Pulmonary:     Effort: Pulmonary effort is normal.  Musculoskeletal:        General: Normal range of motion.  Skin:    General: Skin is warm.  Neurological:     Mental Status: She is alert.     Neuro sensation intact with patient found to have diminished PT and DP PT pulses bilateral right over left status intact muscle strength found to be adequate range of motion adequate with patient found to have thick yellow brittle nailbeds 1-5 both feet that become incurvated and gets sore and are difficult for her to cut     Assessment:  Vascular disease complicating factor with chronic mycotic nail infection with pain 1-5 both feet     Plan:  H&P reviewed and today I did debride nailbeds 1-5 both feet no iatrogenic bleeding noted discussed vascular disease and I am concerned about long-term as far as that goes.  Patient will be seen back for [place] to recheck is encouraged to call with questions concerns

## 2022-01-04 ENCOUNTER — Other Ambulatory Visit: Payer: Self-pay | Admitting: Urology

## 2022-01-04 DIAGNOSIS — C678 Malignant neoplasm of overlapping sites of bladder: Secondary | ICD-10-CM | POA: Diagnosis not present

## 2022-01-04 DIAGNOSIS — C652 Malignant neoplasm of left renal pelvis: Secondary | ICD-10-CM | POA: Diagnosis not present

## 2022-01-05 ENCOUNTER — Encounter (HOSPITAL_BASED_OUTPATIENT_CLINIC_OR_DEPARTMENT_OTHER): Payer: Self-pay | Admitting: Urology

## 2022-01-05 ENCOUNTER — Other Ambulatory Visit: Payer: Self-pay

## 2022-01-05 NOTE — Progress Notes (Addendum)
Spoke w/ via phone for pre-op interview---pt Lab needs dos---- I stat              Lab results------ COVID test -----patient states asymptomatic no test needed Arrive at -------1230 pm 01-10-2022 NPO after MN NO Solid Food.  Clear liquids from MN until---1130 am Med rec completed Medications to take morning of surgery -----albuterol prb/bring inhaler, amlodipine, metoprolol tartrate, wellbutrin, nicotine patch, ativan prn Diabetic medication -----n/a Patient instructed no nail polish to be worn day of surgery Patient instructed to bring photo id and insurance card day of surgery Patient aware to have Driver (ride ) / caregiver  husband harold   for 24 hours after surgery  Patient Special Instructions -----no smoking x 24 hours before surgery Pre-Op special Istructions -----81 mg aspirin last dose 01-05-2022 per dr pace instructions Patient verbalized understanding of instructions that were given at this phone interview. Patient denies shortness of breath, chest pain, fever, cough at this phone interview.  Ekg 01-26-2021 chart/epic Echo 02-02-2021 epic gated stress test 8-02-24-2021 epic [facility] cardiology 01-24-2021 dr c schuman epic [facility] dr Stanford Breed 06-27-2021 epic  Reviewed pt history and [facility] cardiology 01-24-2021 with dr Royetta Car, mda, pt ok for 01-10-2022 surgery at [place] per dr Royetta Car, mda

## 2022-01-10 ENCOUNTER — Encounter (HOSPITAL_BASED_OUTPATIENT_CLINIC_OR_DEPARTMENT_OTHER)
Admission: RE | Disposition: A | Discharge status: Home / Self Care | Payer: Self-pay | Source: Home / Self Care | Attending: Urology

## 2022-01-10 ENCOUNTER — Ambulatory Visit (HOSPITAL_BASED_OUTPATIENT_CLINIC_OR_DEPARTMENT_OTHER): Payer: Medicare Other | Admitting: Anesthesiology

## 2022-01-10 ENCOUNTER — Ambulatory Visit (HOSPITAL_BASED_OUTPATIENT_CLINIC_OR_DEPARTMENT_OTHER)
Admission: RE | Admit: 2022-01-10 | Discharge: 2022-01-10 | Disposition: A | Discharge status: Home / Self Care | Payer: Medicare Other | Attending: Urology | Admitting: Urology

## 2022-01-10 ENCOUNTER — Encounter (HOSPITAL_BASED_OUTPATIENT_CLINIC_OR_DEPARTMENT_OTHER): Payer: Self-pay | Admitting: Urology

## 2022-01-10 DIAGNOSIS — Z906 Acquired absence of other parts of urinary tract: Secondary | ICD-10-CM | POA: Insufficient documentation

## 2022-01-10 DIAGNOSIS — J449 Chronic obstructive pulmonary disease, unspecified: Secondary | ICD-10-CM

## 2022-01-10 DIAGNOSIS — F17200 Nicotine dependence, unspecified, uncomplicated: Secondary | ICD-10-CM | POA: Diagnosis not present

## 2022-01-10 DIAGNOSIS — G4730 Sleep apnea, unspecified: Secondary | ICD-10-CM

## 2022-01-10 DIAGNOSIS — C678 Malignant neoplasm of overlapping sites of bladder: Secondary | ICD-10-CM | POA: Diagnosis not present

## 2022-01-10 DIAGNOSIS — Z8553 Personal history of malignant neoplasm of renal pelvis: Secondary | ICD-10-CM

## 2022-01-10 DIAGNOSIS — C768 Malignant neoplasm of other specified ill-defined sites: Secondary | ICD-10-CM | POA: Diagnosis not present

## 2022-01-10 DIAGNOSIS — I129 Hypertensive chronic kidney disease with stage 1 through stage 4 chronic kidney disease, or unspecified chronic kidney disease: Secondary | ICD-10-CM | POA: Diagnosis not present

## 2022-01-10 DIAGNOSIS — I2510 Atherosclerotic heart disease of native coronary artery without angina pectoris: Secondary | ICD-10-CM | POA: Diagnosis not present

## 2022-01-10 DIAGNOSIS — Z01818 Encounter for other preprocedural examination: Secondary | ICD-10-CM

## 2022-01-10 DIAGNOSIS — F17210 Nicotine dependence, cigarettes, uncomplicated: Secondary | ICD-10-CM

## 2022-01-10 DIAGNOSIS — R911 Solitary pulmonary nodule: Secondary | ICD-10-CM | POA: Diagnosis not present

## 2022-01-10 DIAGNOSIS — C679 Malignant neoplasm of bladder, unspecified: Secondary | ICD-10-CM | POA: Diagnosis not present

## 2022-01-10 DIAGNOSIS — N189 Chronic kidney disease, unspecified: Secondary | ICD-10-CM | POA: Diagnosis not present

## 2022-01-10 DIAGNOSIS — D090 Carcinoma in situ of bladder: Secondary | ICD-10-CM | POA: Diagnosis not present

## 2022-01-10 DIAGNOSIS — Z905 Acquired absence of kidney: Secondary | ICD-10-CM | POA: Diagnosis not present

## 2022-01-10 HISTORY — DX: Peripheral vascular disease, unspecified: I73.9

## 2022-01-10 HISTORY — PX: TRANSURETHRAL RESECTION OF BLADDER TUMOR: SHX2575

## 2022-01-10 LAB — POCT I-STAT, CHEM 8
BUN: 28 mg/dL — ABNORMAL HIGH (ref 8–23)
Calcium, Ion: 1.25 mmol/L (ref 1.15–1.40)
Chloride: 106 mmol/L (ref 98–111)
Creatinine, Ser: 1.4 mg/dL — ABNORMAL HIGH (ref 0.44–1.00)
Glucose, Bld: 96 mg/dL (ref 70–99)
HCT: 41 % (ref 36.0–46.0)
Hemoglobin: 13.9 g/dL (ref 12.0–15.0)
Potassium: 4.4 mmol/L (ref 3.5–5.1)
Sodium: 138 mmol/L (ref 135–145)
TCO2: 23 mmol/L (ref 22–32)

## 2022-01-10 SURGERY — TURBT (TRANSURETHRAL RESECTION OF BLADDER TUMOR)
Anesthesia: General | Site: Bladder

## 2022-01-10 MED ORDER — OXYCODONE HCL 5 MG PO TABS: 5.0000 mg | ORAL_TABLET | Freq: Once | ORAL | Status: DC | PRN

## 2022-01-10 MED ORDER — ACETAMINOPHEN 500 MG PO TABS
ORAL_TABLET | ORAL | Status: AC
  Filled 2022-01-10: qty 1

## 2022-01-10 MED ORDER — PHENYLEPHRINE 80 MCG/ML (10ML) SYRINGE FOR IV PUSH (FOR BLOOD PRESSURE SUPPORT)
PREFILLED_SYRINGE | INTRAVENOUS | Status: DC | PRN
  Administered 2022-01-10 (×2): 160 ug via INTRAVENOUS
  Administered 2022-01-10: 240 ug via INTRAVENOUS

## 2022-01-10 MED ORDER — FENTANYL CITRATE (PF) 100 MCG/2ML IJ SOLN
INTRAMUSCULAR | Status: DC | PRN
  Administered 2022-01-10: 25 ug via INTRAVENOUS

## 2022-01-10 MED ORDER — PHENYLEPHRINE 80 MCG/ML (10ML) SYRINGE FOR IV PUSH (FOR BLOOD PRESSURE SUPPORT)
PREFILLED_SYRINGE | INTRAVENOUS | Status: AC
  Filled 2022-01-10: qty 10

## 2022-01-10 MED ORDER — CEFAZOLIN SODIUM-DEXTROSE 2-4 GM/100ML-% IV SOLN
2.0000 g | Freq: Once | INTRAVENOUS | Status: AC
  Administered 2022-01-10: 2 g via INTRAVENOUS

## 2022-01-10 MED ORDER — GEMCITABINE CHEMO FOR BLADDER INSTILLATION 2000 MG
2000.0000 mg | Freq: Once | INTRAVENOUS | Status: AC
  Administered 2022-01-10: 2000 mg via INTRAVESICAL
  Filled 2022-01-10: qty 2000

## 2022-01-10 MED ORDER — PHENYLEPHRINE HCL-NACL 20-0.9 MG/250ML-% IV SOLN
INTRAVENOUS | Status: DC | PRN
  Administered 2022-01-10: 100 ug/min via INTRAVENOUS

## 2022-01-10 MED ORDER — PHENYLEPHRINE HCL (PRESSORS) 10 MG/ML IV SOLN
INTRAVENOUS | Status: AC
  Filled 2022-01-10: qty 1

## 2022-01-10 MED ORDER — FENTANYL CITRATE (PF) 100 MCG/2ML IJ SOLN: 25.0000 ug | INTRAMUSCULAR | Status: DC | PRN

## 2022-01-10 MED ORDER — OXYCODONE HCL 5 MG/5ML PO SOLN: 5.0000 mg | Freq: Once | ORAL | Status: DC | PRN

## 2022-01-10 MED ORDER — ONDANSETRON HCL 4 MG/2ML IJ SOLN
INTRAMUSCULAR | Status: DC | PRN
  Administered 2022-01-10: 4 mg via INTRAVENOUS

## 2022-01-10 MED ORDER — MIDAZOLAM HCL 5 MG/5ML IJ SOLN
INTRAMUSCULAR | Status: DC | PRN
  Administered 2022-01-10 (×2): 1 mg via INTRAVENOUS

## 2022-01-10 MED ORDER — ACETAMINOPHEN 500 MG PO TABS
1000.0000 mg | ORAL_TABLET | Freq: Once | ORAL | Status: AC
  Administered 2022-01-10: 1000 mg via ORAL

## 2022-01-10 MED ORDER — LIDOCAINE HCL (PF) 2 % IJ SOLN
INTRAMUSCULAR | Status: AC
  Filled 2022-01-10: qty 5

## 2022-01-10 MED ORDER — SUGAMMADEX SODIUM 200 MG/2ML IV SOLN
INTRAVENOUS | Status: DC | PRN
  Administered 2022-01-10: 200 mg via INTRAVENOUS

## 2022-01-10 MED ORDER — GLYCOPYRROLATE PF 0.2 MG/ML IJ SOSY
PREFILLED_SYRINGE | INTRAMUSCULAR | Status: DC | PRN
  Administered 2022-01-10 (×2): .2 mg via INTRAVENOUS

## 2022-01-10 MED ORDER — SODIUM CHLORIDE 0.9 % IR SOLN
Status: DC | PRN
  Administered 2022-01-10: 6000 mL via INTRAVESICAL

## 2022-01-10 MED ORDER — MIDAZOLAM HCL 2 MG/2ML IJ SOLN
INTRAMUSCULAR | Status: AC
  Filled 2022-01-10: qty 2

## 2022-01-10 MED ORDER — DEXAMETHASONE SODIUM PHOSPHATE 10 MG/ML IJ SOLN
INTRAMUSCULAR | Status: AC
  Filled 2022-01-10: qty 1

## 2022-01-10 MED ORDER — ONDANSETRON HCL 4 MG/2ML IJ SOLN: 4.0000 mg | Freq: Once | INTRAMUSCULAR | Status: DC | PRN

## 2022-01-10 MED ORDER — GLYCOPYRROLATE PF 0.2 MG/ML IJ SOSY
PREFILLED_SYRINGE | INTRAMUSCULAR | Status: AC
  Filled 2022-01-10: qty 1

## 2022-01-10 MED ORDER — ROCURONIUM BROMIDE 10 MG/ML (PF) SYRINGE
PREFILLED_SYRINGE | INTRAVENOUS | Status: DC | PRN
  Administered 2022-01-10: 60 mg via INTRAVENOUS

## 2022-01-10 MED ORDER — DEXAMETHASONE SODIUM PHOSPHATE 10 MG/ML IJ SOLN
INTRAMUSCULAR | Status: DC | PRN
  Administered 2022-01-10: 10 mg via INTRAVENOUS

## 2022-01-10 MED ORDER — LACTATED RINGERS IV SOLN: INTRAVENOUS | Status: DC

## 2022-01-10 MED ORDER — PROPOFOL 10 MG/ML IV BOLUS
INTRAVENOUS | Status: AC
  Filled 2022-01-10: qty 20

## 2022-01-10 MED ORDER — CEFAZOLIN SODIUM-DEXTROSE 2-4 GM/100ML-% IV SOLN
INTRAVENOUS | Status: AC
  Filled 2022-01-10: qty 100

## 2022-01-10 MED ORDER — AMISULPRIDE (ANTIEMETIC) 5 MG/2ML IV SOLN
10.0000 mg | Freq: Once | INTRAVENOUS | Status: DC | PRN
Start: 2022-01-10 — End: 2022-01-10

## 2022-01-10 MED ORDER — ROCURONIUM BROMIDE 10 MG/ML (PF) SYRINGE
PREFILLED_SYRINGE | INTRAVENOUS | Status: AC
  Filled 2022-01-10: qty 10

## 2022-01-10 MED ORDER — PROPOFOL 10 MG/ML IV BOLUS
INTRAVENOUS | Status: DC | PRN
  Administered 2022-01-10: 80 mg via INTRAVENOUS
  Administered 2022-01-10: 120 mg via INTRAVENOUS

## 2022-01-10 MED ORDER — FENTANYL CITRATE (PF) 100 MCG/2ML IJ SOLN
INTRAMUSCULAR | Status: AC
  Filled 2022-01-10: qty 2

## 2022-01-10 MED ORDER — ONDANSETRON HCL 4 MG/2ML IJ SOLN
INTRAMUSCULAR | Status: AC
  Filled 2022-01-10: qty 2

## 2022-01-10 MED ORDER — LIDOCAINE 2% (20 MG/ML) 5 ML SYRINGE
INTRAMUSCULAR | Status: DC | PRN
  Administered 2022-01-10: 100 mg via INTRAVENOUS

## 2022-01-10 SURGICAL SUPPLY — 22 items
BAG DRAIN URO-CYSTO SKYTR STRL (DRAIN) ×2 IMPLANT
BAG DRN RND TRDRP ANRFLXCHMBR (UROLOGICAL SUPPLIES) ×1
BAG DRN UROCATH (DRAIN) ×1
BAG URINE DRAIN 2000ML AR STRL (UROLOGICAL SUPPLIES) ×1 IMPLANT
BAG URINE LEG 500ML (DRAIN) IMPLANT
CATH FOLEY 2WAY SLVR  5CC 18FR (CATHETERS) ×2
CATH FOLEY 2WAY SLVR 5CC 18FR (CATHETERS) IMPLANT
GLOVE BIO SURGEON STRL SZ7.5 (GLOVE) ×2 IMPLANT
GOWN STRL REUS W/TWL XL LVL3 (GOWN DISPOSABLE) ×2 IMPLANT
HOLDER FOLEY CATH W/STRAP (MISCELLANEOUS) IMPLANT
IV NS IRRIG 3000ML ARTHROMATIC (IV SOLUTION) ×2 IMPLANT
KIT TURNOVER CYSTO (KITS) ×2 IMPLANT
LOOP CUT BIPOLAR 24F LRG (ELECTROSURGICAL) IMPLANT
MANIFOLD NEPTUNE II (INSTRUMENTS) ×2 IMPLANT
NS IRRIG 500ML POUR BTL (IV SOLUTION) IMPLANT
PACK CYSTO (CUSTOM PROCEDURE TRAY) ×2 IMPLANT
SYR TOOMEY IRRIG 70ML (MISCELLANEOUS)
SYRINGE TOOMEY IRRIG 70ML (MISCELLANEOUS) IMPLANT
TUBE CONNECTING 12X1/4 (SUCTIONS) ×2 IMPLANT
TUBING UROLOGY SET (TUBING) ×2 IMPLANT
WATER STERILE IRR 3000ML UROMA (IV SOLUTION) IMPLANT
WATER STERILE IRR 500ML POUR (IV SOLUTION) IMPLANT

## 2022-01-10 NOTE — Anesthesia Postprocedure Evaluation (Signed)
Anesthesia Post Note  Patient: Morgan Torres  Procedure(s) Performed: TRANSURETHRAL RESECTION OF BLADDER TUMOR (TURBT) WITH CYSTOSCOPY / GEMCITABINE INSTILLATION post-operatively (Bladder)     Patient location during evaluation: PACU Anesthesia Type: General Level of consciousness: awake and alert Pain management: pain level controlled Vital Signs Assessment: post-procedure vital signs reviewed and stable Respiratory status: spontaneous breathing, nonlabored ventilation and respiratory function stable Cardiovascular status: blood pressure returned to baseline and stable Postop Assessment: no apparent nausea or vomiting Anesthetic complications: no   No notable events documented.  Last Vitals:  Vitals:   01/10/22 1545 01/10/22 1600  BP: (!) 141/63 (!) 120/56  Pulse: (!) 55 (!) 53  Resp: 10 16  Temp: (!) 36.3 C (!) 36.3 C  SpO2: 96% 94%    Last Pain:  Vitals:   01/10/22 1545  TempSrc:   PainSc: 0-No pain                 Lidia Collum

## 2022-01-10 NOTE — Discharge Instructions (Addendum)
No acetaminophen/Tylenol until after 6:00pm today if needed for pain.    Post Anesthesia Home Care Instructions  Activity: Get plenty of rest for the remainder of the day. A responsible individual must stay with you for 24 hours following the procedure.  For the next 24 hours, DO NOT: -Drive a car -Paediatric nurse -Drink alcoholic beverages -Take any medication unless instructed by your physician -Make any legal decisions or sign important papers.  Meals: Start with liquid foods such as gelatin or soup. Progress to regular foods as tolerated. Avoid greasy, spicy, heavy foods. If nausea and/or vomiting occur, drink only clear liquids until the nausea and/or vomiting subsides. Call your physician if vomiting continues.  Special Instructions/Symptoms: Your throat may feel dry or sore from the anesthesia or the breathing tube placed in your throat during surgery. If this causes discomfort, gargle with warm salt water. The discomfort should disappear within 24 hours.   **If patient is unable to void within 6 hours of discharge, call physician and go to nearest ER**

## 2022-01-10 NOTE — H&P (Signed)
PRE-OP H&P  Office Visit Report     01/04/2022   --------------------------------------------------------------------------------   Morgan Torres  MRN: [id number]  DOB: 1951/05/13, 71 year old Female  PRIMARY CARE:  Morgan Beard, MD  REFERRING:  Morgan Torres. Morgan Neighbours, MD  PROVIDER:  Ellison Torres, M.D.  LOCATION:  [facility] 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 She was found to have multiple papillary bladder tumors on surveillance cystoscopy in January 2023 and is status post TURBT with gemcitabine instillation on 08/23/2021. TURBT path showed HG Ta UCC. Induction BCG completed 11/14/21.   03/17/21: The patient is here today for routine postop visit. She reports a 4 to 5-day history of abdominal bloating and discomfort. She admits that she has not had a bowel movement for several days. She passed a small bowel movement while in the hospital, but has only since passed a hard, round and firm stool since then. She denies nausea/vomiting, fever/chills or incisional pain. She is passing flatus. She reports serous discharge from her lateral port incision, which is where her JP drain exited. Otherwise, she is ambulating without difficulty and has no urinary complaints today. Denies hematuria or dysuria.   04/21/2021: Back today for follow-up visit. Doing well from a urological perspective. Her issues with abdominal bloating and discomfort as well as constipation have all grossly resolved. She is no longer having any significant or notable pain and discomfort in the left flank, lower back or abdomen. Voiding symptoms grossly stable. No changes in baseline frequency/urgency, interval burning or dysuria, no interval gross hematuria. She has had no further issues with wound  healing including resolution of drainage from port sites. No interval bleeding from the affected areas. Unfortunately the patient has been having numbness in her right fifth digit as well as numbness in the right foot. Primary care has referred her to heart and vascular for evaluation which she has an appointment with him early next week. Recent laboratory data assess showed an improvement in creatinine from last assessment here. Decreased from 1.4-1.3 assessed earlier this week. She denies interval fevers or chills, nausea/vomiting. She continues to have fatigue and poor appetite but is trying to stay hydrated and nourished to the best of her ability. She denies interval chest pain, shortness of breath, dyspnea upon exertion.   07/21/21: The patient is here today for a routine follow-up. Recent surveillance CT urogram showed no clear evidence of metastatic disease. She was noted to have some indeterminate left retroperitoneal lymph nodes. She is urinating without difficulty and denies interval UTIs, dysuria or episodes of gross hematuria.   08/17/2021: 71 year old female who presents today for preoperative appointment prior to undergoing a TURBT. She is trying to quit smoking but has not been successful at this. She has a daughter that is undergoing chemotherapy for breast cancer, she is quite tearful today and has some questions regarding her upcoming surgery. 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 She is wondering if she will be able to work postoperatively. She denies interval fevers and chills. No complaints of shortness of breath or chest pain.   09/01/21: The patient is here today for routine follow-up after TURBT on 08/23/2021, which showed Ta noninvasive high-grade UCC (muscularis was present and uninvolved). She had a right ureteral stent placed due to  the close proximity of one of her small tumors, but the stent was removed prematurely after the string was inadvertently  pulled. She is urinating without difficulty and denies flank pain, dysuria or hematuria. She does report soreness around her urethra, but states that it is progressively improving.   01/04/22: The patient is here today for a routine follow-up and surveillance cystoscopy after completing induction BCG. Her recent surveillance CT scan showed a 5 mm left subpleural nodule with no other signs of metastatic disease. She states that she tolerated BCG treatments well and denies interval UTIs, dysuria or gross hematuria.     ALLERGIES: Codeine NSAIDs    MEDICATIONS: Metoprolol Tartrate 25 mg tablet  Albuterol Sulfate  Amlodipine Besilate  Atorvastatin Calcium 20 mg tablet  Cilostazol  Epinephrine 0.3 mg/0.3 ml auto-injector  Lorazepam 1 mg tablet  Losartan Potassium  Wellbutrin Sr     GU PSH: Bladder Instill AntiCA Agent - 11/14/2021, 11/07/2021, 10/31/2021, 10/24/2021, 10/17/2021, 10/10/2021, 08/23/2021 Cystoscopy - 07/21/2021 Cystoscopy Insert Stent - 08/23/2021, 03/09/2021 Cystoscopy TURBT <2 cm - 08/23/2021 Lap Nephro Ureterectomy - 03/09/2021 Locm 300-399Mg/Ml Iodine,1Ml - 01/01/2022, 07/17/2021     NON-GU PSH: No Non-GU PSH    GU PMH: Renal pelvis cancer, left - 01/01/2022, - 09/01/2021, - 07/21/2021, - 07/17/2021, - 04/21/2021, - 03/17/2021, - 01/12/2021 Bladder Cancer overlapping sites - 11/14/2021, - 11/07/2021, - 10/31/2021, - 10/24/2021, - 10/17/2021, - 10/10/2021, - 09/01/2021, - 08/17/2021, - 07/21/2021 Gross hematuria - 01/23/2021, - 01/12/2021 Left renal neoplasm - 01/23/2021      PMH Notes: Renal ultrasound 11/22/17 revealed normal kidneys bilaterally without mass, stones or hydronephrosis.  Heart attack   NON-GU PMH: Anxiety Asthma Hypertension Sleep Apnea    FAMILY HISTORY: 1 Daughter - Daughter 1 son - Son Death In The Family Mother - Mother Kidney Cancer - Sister   SOCIAL HISTORY: Marital Status: Married Preferred Language: English; Ethnicity: Not Hispanic Or Latino; Race: Black or African  American Current Smoking Status: Patient smokes. Has smoked since 12/24/1990. Smokes 1 pack per day.   Tobacco Use Assessment Completed: Used Tobacco in last 30 days? Has never drank.  Drinks 2 caffeinated drinks per day.    VITAL SIGNS:      01/04/2022 09:40 AM  Weight 10 lb / 4.54 kg  Height 68 in / 172.72 cm  BP 149/78 mmHg  Pulse 60 /min  Temperature 97.5 F / 36.3 C  BMI 1.5 kg/m   MULTI-SYSTEM PHYSICAL EXAMINATION:    Constitutional: Well-nourished. No physical deformities. Normally developed. Good grooming.  Neurologic / Psychiatric: Oriented to time, oriented to place, oriented to person. No depression, no anxiety, no agitation.     Complexity of Data:  Records Review:   Pathology Reports, Previous Patient Records  X-Ray Review: C.T. Chest/ Abd/Pelvis: Reviewed Films. Reviewed Report. Discussed With Patient.     12/25/21  General Chemistry  Sodium 131 mEq/L  Potassium 4.3 mEq/L  BUN 21 mg/dL  Creatinine 1.0 mg/dL  Chloride 102 mEq/L  CO2 26 mEq/L  Glucose 98 mg/dL  Calcium 9.3 mg/dL  eGFR African American 65.2   eGFR Non-Afr. American 56.2    PROCEDURES:         Flexible Cystoscopy - 52000  Risks, benefits, and some of the potential complications of the procedure were discussed at length with the patient including infection, bleeding, voiding discomfort, urinary retention, fever, chills, sepsis, and others. All questions were answered. Informed consent was obtained. Antibiotic prophylaxis was given. Sterile technique and intraurethral analgesia were used.  Meatus:  Normal size. Normal location. Normal condition.  Urethra:  No hypermobility. No leakage.  Ureteral Orifices:  Normal location. Normal size. Normal shape. Effluxed clear urine.  Bladder:  A posterior wall tumor. A trigone tumor. A dome tumor. < 1/2 cm tumor. No trabeculation. Normal mucosa. No stones.      The lower urinary tract was carefully examined. The procedure was well-tolerated and without  complications. Antibiotic instructions were given. Instructions were given to call the office immediately for bloody urine, difficulty urinating, urinary retention, painful or frequent urination, fever, chills, nausea, vomiting or other illness. The patient stated that she understood these instructions and would comply with them.         Urinalysis Dipstick Dipstick Cont'd  Color: Yellow Bilirubin: Neg mg/dL  Appearance: Clear Ketones: Neg mg/dL  Specific Gravity: 1.015 Blood: Neg ery/uL  pH: 5.5 Protein: Neg mg/dL  Glucose: Neg mg/dL Urobilinogen: 0.2 mg/dL    Nitrites: Neg    Leukocyte Esterase: Neg leu/uL    ASSESSMENT:      ICD-10 Details  1 GU:   Renal pelvis cancer, left - C65.2 Chronic, Stable  2   Bladder Cancer overlapping sites - C67.8 Chronic, Stable  3 NON-GU:   Solitary pulmonary nodule - R91.1    PLAN:           Orders         Schedule Labs: 4 Months - CMP  X-Rays: 4 Months - C.T. Chest/ Abd/Pelvis With and Without I.V. Contrast  Return Visit/Planned Activity: Next Available Appointment - Schedule Surgery          Document Letter(s):  Created for Morgan Beard, MD   Created for Patient: Clinical Summary         Notes:    -Cystoscopy revealed 3 papillary bladder tumors 1 at the posterior bladder wall, one of the bladder dome and 1 at the left bladder neck.   The risks, benefits and alternatives of cystoscopy with TURBT and gemcitabine instillation was discussed with the patient. The risks include, but are not limited to, bleeding, urinary tract infection, bladder perforation requiring prolonged catheterization and/or open bladder repair, ureteral obstruction, voiding dysfunction and the inherent risks of general anesthesia. The patient voices understanding and wishes to proceed.

## 2022-01-10 NOTE — Anesthesia Procedure Notes (Signed)
Procedure Name: Intubation Date/Time: 01/10/2022 1:55 PM  Performed by: Rogers Blocker, CRNAPre-anesthesia Checklist: Patient identified, Emergency Drugs available, Suction available and Patient being monitored Patient Re-evaluated:Patient Re-evaluated prior to induction Oxygen Delivery Method: Circle System Utilized Preoxygenation: Pre-oxygenation with 100% oxygen Induction Type: IV induction Ventilation: Mask ventilation without difficulty Laryngoscope Size: Mac and 3 Grade View: Grade I Tube type: Oral Tube size: 7.0 mm Number of attempts: 1 Airway Equipment and Method: Stylet Placement Confirmation: ETT inserted through vocal cords under direct vision, positive ETCO2 and breath sounds checked- equal and bilateral Secured at: 22 cm Tube secured with: Tape Dental Injury: Teeth and Oropharynx as per pre-operative assessment  Comments: Attempted LMA 4, unable to seat; attempted LMA 4 curved, unable to seat. Decision made to intubate pt. +EtcO2, no difficulty.

## 2022-01-10 NOTE — Transfer of Care (Signed)
Immediate Anesthesia Transfer of Care Note  Patient: Morgan Torres  Procedure(s) Performed: TRANSURETHRAL RESECTION OF BLADDER TUMOR (TURBT) WITH CYSTOSCOPY / GEMCITABINE INSTILLATION post-operatively (Bladder)  Patient Location: PACU  Anesthesia Type:General  Level of Consciousness: awake, alert , oriented and patient cooperative  Airway & Oxygen Therapy: Patient Spontanous Breathing and Patient connected to face mask oxygen  Post-op Assessment: Report given to RN and Post -op Vital signs reviewed and stable  Post vital signs: Reviewed and stable  Last Vitals:  Vitals Value Taken Time  BP 113/61 01/10/22 1439  Temp    Pulse    Resp 17 01/10/22 1442  SpO2    Vitals shown include unvalidated device data.  Last Pain:  Vitals:   01/10/22 1146  TempSrc: Oral  PainSc: 0-No pain      Patients Stated Pain Goal: 5 (56/43/32 9518)  Complications: No notable events documented.

## 2022-01-10 NOTE — Op Note (Signed)
Operative Note  Preoperative diagnosis:  1.  Recurrent bladder cancer 2.  History of high-grade urothelial cell carcinoma of the left renal pelvis, s/p left robotic nephroureterectomy on 03/09/21  Postoperative diagnosis: 1.  Recurrent bladder cancer 2.  History of high-grade urothelial cell carcinoma of the left renal pelvis, s/p left robotic nephroureterectomy on 03/09/21  Procedure(s): 1.  Cystoscopy with TURBT (small) 2.  Intravesical instillation of gemcitabine  Surgeon: Ellison Hughs, MD  Assistants:  None  Anesthesia:  General  Complications:  None  EBL: Less than 5 mL  Specimens: 1.  Bladder tumors  Drains/Catheters: 1.  18 French Foley catheter  Intraoperative findings:   Papillary bladder tumors involving the posterior bladder wall, bladder dome and previous left ureteral orifice resection site.  The bladder dome tumor was very superficial and bluntly removed with the bipolar loop and then extensively fulgurated.  The other 2 tumors were excised with the cutting loop  Indication:  Morgan Torres is a 71 y.o. female with a history of high-grade urothelial carcinoma of the left renal pelvis, status post left robotic nephro ureterectomy on 03/09/2021.  She was subsequently found to have multiple bladder tumor recurrences on surveillance cystoscopy that were ultimately found to be high-grade TA UCC.  Recent surveillance cystoscopy following induction BCG revealed multiple bladder tumor recurrences.  She has been consented for the above procedures, voices understanding and wishes to proceed.    Description of procedure:  After informed consent was obtained, the patient was brought to the operating room and general endotracheal anesthesia was administered. The patient was then placed in the dorsolithotomy position and prepped and draped in the usual sterile fashion. A timeout was performed. A 23 French rigid cystoscope was then inserted into the urethral meatus and advanced  into the bladder under direct vision. A complete bladder survey revealed the findings listed above.  The rigid cystoscope was then exchanged for a 26 French resectoscope with a bipolar loop working element.  The posterior bladder wall and left posterior bladder wall tumors were then resected down to the detrusor musculature and sent for permanent section.  The areas of resection were then extensively fulgurated until hemostasis was achieved.  These tumors measured approximately 5 mm in greatest dimension.  A small papillary lesion was seen involving the bladder dome that became detached with manipulation of the bipolar loop.  This tumor was evacuated through the sheath of the cystoscope.  The area that the tumor resided at was extensively fulgurated until hemostasis was achieved.  There were no other suspicious areas involving the bladder mucosa or evidence of bladder perforation.  While in the recovery room 2000 mg of gemcitabine in 50 mL of water was instilled in the bladder through the catheter and the catheter was plugged. This will remain indwelling for approximately one hour. It will then be drained from the bladder and the catheter will be removed and the patient discharged home.  Plan: Remove Foley catheter 1 hour after gemcitabine instillation and discharge home.

## 2022-01-10 NOTE — Anesthesia Preprocedure Evaluation (Signed)
Anesthesia Evaluation  Patient identified by MRN, date of birth, ID band Patient awake    Reviewed: Allergy & Precautions, NPO status , Patient's Chart, lab work & pertinent test results  History of Anesthesia Complications Negative for: history of anesthetic complications  Airway Mallampati: II  TM Distance: >3 FB Neck ROM: Full    Dental  (+) Dental Advisory Given, Edentulous Upper   Pulmonary asthma , sleep apnea , COPD, Current Smoker,    Pulmonary exam normal        Cardiovascular hypertension, + CAD, + Past MI and + Peripheral Vascular Disease  Normal cardiovascular exam   Echo 02/02/21: EF 60-65%, no RWMA, mild LVH, normal RVSF, trivial MR, mild-mod AV sclerosis w/o stenosis  Stress test 01/31/21: EF 68%, no ST segment deviation during stress, normal, low risk study   Neuro/Psych negative neurological ROS     GI/Hepatic negative GI ROS, Neg liver ROS,   Endo/Other  negative endocrine ROS  Renal/GU CRFRenal disease (Cr 1.40)   Bladder cancer    Musculoskeletal  (+) Fibromyalgia -  Abdominal   Peds  Hematology negative hematology ROS (+)   Anesthesia Other Findings   Reproductive/Obstetrics                            Anesthesia Physical Anesthesia Plan  ASA: 3  Anesthesia Plan: General   Post-op Pain Management: Tylenol PO (pre-op)* and Toradol IV (intra-op)*   Induction: Intravenous  PONV Risk Score and Plan: 2 and Ondansetron, Dexamethasone, Midazolam and Treatment may vary due to age or medical condition  Airway Management Planned: LMA  Additional Equipment: None  Intra-op Plan:   Post-operative Plan: Extubation in OR  Informed Consent: I have reviewed the patients History and Physical, chart, labs and discussed the procedure including the risks, benefits and alternatives for the proposed anesthesia with the patient or authorized representative who has indicated  his/her understanding and acceptance.     Dental advisory given  Plan Discussed with:   Anesthesia Plan Comments:         Anesthesia Quick Evaluation

## 2022-01-11 ENCOUNTER — Encounter (HOSPITAL_BASED_OUTPATIENT_CLINIC_OR_DEPARTMENT_OTHER): Payer: Self-pay | Admitting: Urology

## 2022-01-18 DIAGNOSIS — C652 Malignant neoplasm of left renal pelvis: Secondary | ICD-10-CM | POA: Diagnosis not present

## 2022-01-18 DIAGNOSIS — C678 Malignant neoplasm of overlapping sites of bladder: Secondary | ICD-10-CM | POA: Diagnosis not present

## 2022-02-09 ENCOUNTER — Ambulatory Visit (HOSPITAL_COMMUNITY)
Admission: EM | Admit: 2022-02-09 | Discharge: 2022-02-09 | Disposition: A | Discharge status: Home / Self Care | Payer: Medicare Other | Attending: Internal Medicine | Admitting: Internal Medicine

## 2022-02-09 ENCOUNTER — Encounter (HOSPITAL_COMMUNITY): Payer: Self-pay

## 2022-02-09 ENCOUNTER — Ambulatory Visit (INDEPENDENT_AMBULATORY_CARE_PROVIDER_SITE_OTHER): Payer: Medicare Other

## 2022-02-09 DIAGNOSIS — W19XXXA Unspecified fall, initial encounter: Secondary | ICD-10-CM

## 2022-02-09 DIAGNOSIS — M1990 Unspecified osteoarthritis, unspecified site: Secondary | ICD-10-CM | POA: Diagnosis not present

## 2022-02-09 DIAGNOSIS — Y92009 Unspecified place in unspecified non-institutional (private) residence as the place of occurrence of the external cause: Secondary | ICD-10-CM

## 2022-02-09 DIAGNOSIS — M25561 Pain in right knee: Secondary | ICD-10-CM

## 2022-02-09 MED ORDER — ACETAMINOPHEN 500 MG PO TABS: 1000.0000 mg | ORAL_TABLET | Freq: Four times a day (QID) | ORAL | 0 refills | Status: DC | PRN

## 2022-02-09 MED ORDER — MUPIROCIN 2 % EX OINT: 1.0000 | TOPICAL_OINTMENT | Freq: Two times a day (BID) | CUTANEOUS | 0 refills | Status: DC

## 2022-02-09 MED ORDER — TETANUS-DIPHTH-ACELL PERTUSSIS 5-2.5-18.5 LF-MCG/0.5 IM SUSY: 0.5000 mL | PREFILLED_SYRINGE | Freq: Once | INTRAMUSCULAR | Status: DC

## 2022-02-09 MED ORDER — AMOXICILLIN-POT CLAVULANATE 875-125 MG PO TABS: 1.0000 | ORAL_TABLET | Freq: Two times a day (BID) | ORAL | 0 refills | Status: DC

## 2022-02-09 MED ORDER — PREDNISONE 20 MG PO TABS: 20.0000 mg | ORAL_TABLET | Freq: Every day | ORAL | 0 refills | Status: DC

## 2022-02-09 NOTE — Discharge Instructions (Addendum)
Your knee x-ray was negative for acute fracture.  X-ray shows significant arthritis to the right knee that is likely causing acute inflammation and pain related to your fall.  I would like for you to start taking prednisone 20 mg every morning with breakfast for 5 days starting tomorrow.  Make sure you take this medicine with food to avoid stomach upset.   Start taking Augmentin antibiotic to treat infected wound to your right knee as result of your fall. Take Augmentin 2 times daily for the next 7 days.   Place mupirocin ointment to your wound and change the dressing with nonstick gauze 2 times daily.  Wear the knee brace provided in clinic to reduce swelling and provide stability to the knee. Rest, ice, and elevate your right knee over the next few days to reduce swelling as well.   You may take Tylenol 1000 mg every 6 hours as needed for discomfort.  Follow-up with PCP as scheduled on Wednesday.  If you develop any new or worsening symptoms or do not improve in the next 2 to 3 days, please return.  If your symptoms are severe, please go to the emergency room.  Follow-up with your primary care provider for further evaluation and management of your symptoms as well as ongoing wellness visits.  I hope you feel better!

## 2022-02-09 NOTE — ED Triage Notes (Signed)
Pt injured right knee x3 days ago due to a fall . Pt denies head injury . Pt complains of pain and discomfort.

## 2022-02-09 NOTE — ED Provider Notes (Signed)
[place]    CSN: [id number] Arrival date & time: 02/09/22  1709      History   Chief Complaint Chief Complaint  Patient presents with   Knee Injury    Pt fell and injured right knee, pt complains of pain and discomfort x3days. Pt denies head injury    HPI Morgan Torres is a 71 y.o. female.   Patient presents to urgent care for evaluation of right knee pain after she tripped and fell while attempting to walk to the bathroom in the morning 3 days ago.  Patient states that she had the urge to urinate and was attempting to walk to the toilet quickly when her right foot got caught on the handle of one of her overnight bags on the bathroom floor and she fell onto her right knee.  She suffered an abrasion to the right knee and did not hit her head.  She currently states that her pain to her right knee is a 6 on a scale of 0-10 and mostly to the anterior aspect of the right knee.  She is able to ambulate with a steady gait without difficulty although she reports her knee became very sore yesterday and today.  She has attempted use of Tylenol at home for pain but states that this has not helped as much as she would like it to and she is having a difficult time sleeping due to the soreness and pain to the knee.  No numbness or tingling to bilateral lower extremities.  Denies dizziness, syncope, and urinary symptoms.  No fever/chills.  Reports previous injury to bilateral knees and states that she has had surgery to both of her knees in the past.  She has chronic arthritis and bone spurs to her right knee for which she normally takes Tylenol with relief.  Abrasion is draining some purulent fluid. Denies redness and warmth to the right knee.  No neck pain, pain to other joints in her body, or any other aggravating or relieving factors for symptoms.     Past Medical History:  Diagnosis Date   Anxiety    Arthritis    hip, lumbar spine    Asthma    seasonal allergies    Bronchitis     Hx: of   Chronic kidney disease    COPD (chronic obstructive pulmonary disease) (HCC)    Coronary artery disease    Depression    Fibromyalgia    Hypertension    Lumbar herniated disc    Myocardial infarction ([place]) 06/25/2012   followed by Dr. Debara Pickett, treated medically, no stents   Peripheral arterial disease ([place])    of right foot   Pneumonia    Hx: of yrs ago   Sciatica    Sleep apnea    Small bowel obstruction ([place])    several yrs ago    Patient Active Problem List   Diagnosis Date Noted   Renal mass 03/09/2021   Morbid obesity, unspecified obesity type ([place]) 08/28/2016   Non-restorative sleep 08/28/2016   Snoring 08/28/2016   Hip pain 06/03/2014   Acute blood loss anemia 06/01/2014   Osteoarthritis of left hip 05/28/2014   Obesity (BMI 30.0-34.9) 04/20/2013   Nonischemic cardiomyopathy ([place]) 04/01/2013   Smoking 04/01/2013   NSTEMI - Troponin pk 1.5 with ant TWI on EKG 03/29/2013   Spinal stenosis, lumbar region, with neurogenic claudication 01/23/2013   VAGINAL TRICHOMONIASIS 06/28/2006   Hyperlipidemia 06/28/2006   ANEMIA-NOS 06/28/2006   ANXIETY  06/28/2006   DEPRESSION 06/28/2006   SYNDROME, CARPAL TUNNEL 06/28/2006   Essential hypertension 06/28/2006   ALLERGIC RHINITIS 06/28/2006   ASTHMA 06/28/2006   PAROTIDITIS 06/28/2006   LOW BACK PAIN 06/28/2006   TB SKIN TEST, POSITIVE, HX OF 06/28/2006    Past Surgical History:  Procedure Laterality Date   ABDOMINAL SURGERY     resection of small intestine   ANTERIOR CRUCIATE LIGAMENT REPAIR Bilateral    BACK SURGERY     fusion of lumbar   CARDIAC CATHETERIZATION  06/25/2012   COLON SURGERY     resection for bowel - for obstruction  6 to 7 yrs ago per pt on 01-05-2022   COLONOSCOPY     Hx; of   DILATION AND CURETTAGE OF UTERUS     EYE SURGERY Bilateral    cataracts removed   HERNIA REPAIR  02/23/1969   inguinal hernia    LEFT HEART CATHETERIZATION WITH CORONARY ANGIOGRAM N/A 03/30/2013   Procedure:  LEFT HEART CATHETERIZATION WITH CORONARY ANGIOGRAM;  Surgeon: Leonie Man, MD;  Location: [facility] CATH LAB;  Service: Cardiovascular;  Laterality: N/A;   left thumb surgery     yrs ago   ROBOT ASSITED LAPAROSCOPIC NEPHROURETERECTOMY Left 03/09/2021   Procedure: XI ROBOT ASSITED LAPAROSCOPIC NEPHROURETERECTOMY/ CYSTOSCOPY WITH LEFT URETEROSCOPY WITH TRANSURETHRAL RESECTION OF URETERAL ORIFICE;  Surgeon: Ceasar Mons, MD;  Location: [facility];  Service: Urology;  Laterality: Left;   SALIVARY GLAND SURGERY Left    approached from inside mouth & side of neck 1970-1980   TONSILLECTOMY     as child   TOTAL HIP ARTHROPLASTY Left 05/28/2014   Procedure: LEFT TOTAL HIP ARTHROPLASTY;  Surgeon: Yvette Rack., MD;  Location: [facility];  Service: Orthopedics;  Laterality: Left;   TRANSURETHRAL RESECTION OF BLADDER TUMOR N/A 08/23/2021   Procedure: TRANSURETHRAL RESECTION OF BLADDER TUMOR (TURBT) WITH CYSTOSCOPY/ POSTOPERATIVE INSTILLATION OF GEMCITABINE/retrograde pyelogram and stent placement (right);  Surgeon: Ceasar Mons, MD;  Location: [facility];  Service: Urology;  Laterality: N/A;   TRANSURETHRAL RESECTION OF BLADDER TUMOR N/A 01/10/2022   Procedure: TRANSURETHRAL RESECTION OF BLADDER TUMOR (TURBT) WITH CYSTOSCOPY / GEMCITABINE INSTILLATION post-operatively;  Surgeon: Ceasar Mons, MD;  Location: [facility];  Service: Urology;  Laterality: N/A;  ONLY NEEDS 30 MIN    OB History   No obstetric history on file.      Home Medications    Prior to Admission medications   Medication Sig Start Date End Date Taking? Authorizing Provider  amoxicillin-clavulanate (AUGMENTIN) 875-125 MG tablet Take 1 tablet by mouth every 12 (twelve) hours. 02/09/22  Yes Talbot Grumbling, FNP  mupirocin ointment (BACTROBAN) 2 % Apply 1 Application topically 2 (two) times daily. 02/09/22  Yes Talbot Grumbling, FNP  predniSONE (DELTASONE) 20 MG tablet  Take 1 tablet (20 mg total) by mouth daily. 02/09/22  Yes Talbot Grumbling, FNP  acetaminophen (TYLENOL) 500 MG tablet Take 2 tablets (1,000 mg total) by mouth every 6 (six) hours as needed for mild pain. 02/09/22   Talbot Grumbling, FNP  albuterol (PROVENTIL HFA;VENTOLIN HFA) 108 (90 Base) MCG/ACT inhaler Inhale 2 puffs into the lungs every 6 (six) hours as needed for wheezing or shortness of breath. 05/10/16   Katy Apo, NP  amLODipine (NORVASC) 5 MG tablet TAKE 1 TABLET BY MOUTH EVERY DAY 03/30/21   Hilty, Nadean Corwin, MD  aspirin EC 81 MG tablet Take 81 mg by mouth daily. Swallow whole.  [provider]  atorvastatin (LIPITOR) 20 MG tablet Take 20 mg by mouth. Monday wed and Friday in am 11/25/20   [provider]  buPROPion (WELLBUTRIN SR) 150 MG 12 hr tablet Take 150 mg by mouth 2 (two) times daily.    [provider]  cilostazol (PLETAL) 100 MG tablet Take 100 mg by mouth 2 (two) times daily.    [provider]  EPINEPHrine (EPIPEN 2-PAK) 0.3 mg/0.3 mL DEVI Inject 0.3 mLs (0.3 mg total) into the muscle once as needed (for severe allergic reaction). CAll 911 immediately if you have to use this medicine 11/26/12   Piepenbrink, Anderson Malta, PA-C  LORazepam (ATIVAN) 0.5 MG tablet Take 0.5 mg by mouth daily as needed for anxiety.    [provider]  losartan-hydrochlorothiazide (HYZAAR) 100-12.5 MG tablet Take 1 tablet by mouth daily.    [provider]  metoprolol tartrate (LOPRESSOR) 25 MG tablet TAKE 1 TABLET (25 MG TOTAL) BY MOUTH 2 (TWO) TIMES DAILY. PT NEEDS TO MAKE APPT WITH PROVIDER FOR FURTHER REFILLS - 1ST ATTEMPT 02/14/21   Pixie Casino, MD  montelukast (SINGULAIR) 10 MG tablet Take 10 mg by mouth at bedtime. Patient not taking: Reported on 01/05/2022    [provider]  nicotine (NICODERM CQ - DOSED IN MG/24 HOURS) 21 mg/24hr patch Place 21 mg onto the skin daily.    [provider]  nicotine polacrilex  (NICORETTE) 4 MG gum Take 4 mg by mouth as needed for smoking cessation.    [provider]  nitroGLYCERIN (NITROSTAT) 0.4 MG SL tablet Place 1 tablet (0.4 mg total) under the tongue every 5 (five) minutes x 3 doses as needed for chest pain. 09/25/18   Hilty, Nadean Corwin, MD  oxybutynin (DITROPAN) 5 MG tablet Take 1 tablet (5 mg total) by mouth every 8 (eight) hours as needed for bladder spasms. Patient not taking: Reported on 01/03/2022 08/23/21   Ceasar Mons, MD  oxyCODONE-acetaminophen (PERCOCET/ROXICET) 5-325 MG tablet Take by mouth every 4 (four) hours as needed for severe pain. Patient not taking: Reported on 01/05/2022    [provider]  phenazopyridine (PYRIDIUM) 200 MG tablet Take 1 tablet by mouth 3 (three) times daily as needed (for pain with urination). Patient not taking: Reported on 01/03/2022 08/23/21 08/23/22  Ceasar Mons, MD  polyethylene glycol (MIRALAX / GLYCOLAX) 17 g packet Take 17 g by mouth as needed.    [provider]  traMADol (ULTRAM) 50 MG tablet Take by mouth every 6 (six) hours as needed. Not taking    [provider]    Family History Family History  Problem Relation Age of Onset   Hypertension Mother    Diabetes Mother    Cancer - Other Mother    Cancer Sister     Social History Social History   Tobacco Use   Smoking status: Every Day    Packs/day: 0.50    Years: 25.00    Total pack years: 12.50    Types: Cigarettes   Smokeless tobacco: Never   Tobacco comments:    Currently on the Nicoderm patch."smokes a few"  Vaping Use   Vaping Use: Never used  Substance Use Topics   Alcohol use: Yes    Comment: occasionally   Drug use: No     Allergies   Diclofenac, Codeine, Hydrocodone, and Ciprofloxacin   Review of Systems Review of Systems Per HPI  Physical Exam Triage Vital Signs ED Triage Vitals  Enc Vitals Group  BP 02/09/22 1726 137/74     Pulse Rate 02/09/22 1726 60     Resp  02/09/22 1726 12     Temp 02/09/22 1726 97.6 F (36.4 C)     Temp Source 02/09/22 1726 Oral     SpO2 02/09/22 1726 98 %     Weight 02/09/22 1725 196 lb (88.9 kg)     Height 02/09/22 1725 '5\' 8"'$  (1.727 m)     Head Circumference --      Peak Flow --      Pain Score 02/09/22 1724 6     Pain Loc --      Pain Edu? --      Excl. in [place]? --    No data found.  Updated Vital Signs BP 137/74 (BP Location: Left Arm)   Pulse 60   Temp 97.6 F (36.4 C) (Oral)   Resp 12   Ht '5\' 8"'$  (1.727 m)   Wt 196 lb (88.9 kg)   SpO2 98%   BMI 29.80 kg/m   Visual Acuity Right Eye Distance:   Left Eye Distance:   Bilateral Distance:    Right Eye Near:   Left Eye Near:    Bilateral Near:     Physical Exam Vitals and nursing note reviewed.  Constitutional:      Appearance: Normal appearance. She is not ill-appearing or toxic-appearing.     Comments: Very pleasant patient sitting on exam in position of comfort table in no acute distress.   HENT:     Head: Normocephalic and atraumatic.     Right Ear: Hearing and external ear normal.     Left Ear: Hearing and external ear normal.     Nose: Nose normal.     Mouth/Throat:     Lips: Pink.     Mouth: Mucous membranes are moist.  Eyes:     General: Lids are normal. Vision grossly intact. Gaze aligned appropriately.     Extraocular Movements: Extraocular movements intact.     Conjunctiva/sclera: Conjunctivae normal.  Cardiovascular:     Heart sounds: S1 normal and S2 normal.  Pulmonary:     Breath sounds: Normal air entry.  Abdominal:     General: Bowel sounds are normal.     Palpations: Abdomen is soft.     Tenderness: There is no abdominal tenderness. There is no right CVA tenderness, left CVA tenderness or guarding.  Musculoskeletal:     Cervical back: Neck supple.     Comments: Swelling and tenderness to palpation of the anterior patella to the right knee.  There is a large abrasion that is pictured below.  See image below for further detail  of abrasion.  Slight warmth present to the surrounding tissue of the abrasion.  Range of motion to the knee is normal and not limited by tenderness.  5/5 strength against resistance to the right knee with flexion and extension of the right knee.  +2 popliteal pulse present.  Sensation intact distal to injury.  No calf pain to palpation.  Negative Homans' sign.  Skin:    General: Skin is warm and dry.     Capillary Refill: Capillary refill takes less than 2 seconds.     Findings: No rash.  Neurological:     General: No focal deficit present.     Mental Status: She is alert and oriented to person, place, and time. Mental status is at baseline.     Cranial Nerves: No dysarthria or facial asymmetry.  Gait: Gait is intact.  Psychiatric:        Mood and Affect: Mood normal.        Speech: Speech normal.        Behavior: Behavior normal.        Thought Content: Thought content normal.        Judgment: Judgment normal.    Right knee     UC Treatments / Results  Labs (all labs ordered are listed, but only abnormal results are displayed) Labs Reviewed - No data to display  EKG   Radiology DG Knee Complete 4 Views Right  Result Date: 02/09/2022 CLINICAL DATA:  Fall, pain EXAM: RIGHT KNEE - COMPLETE 4+ VIEW COMPARISON:  None Available. FINDINGS: No fracture or dislocation of the right knee. Severe tricompartmental arthrosis, worst in the medial compartment. No knee joint effusion. Soft tissues unremarkable. IMPRESSION: 1.  No fracture or dislocation of the right knee. 2. Severe tricompartmental arthrosis, worst in the medial compartment. Electronically Signed   By: Delanna Ahmadi M.D.   On: 02/09/2022 17:42    Procedures Procedures (including critical care time)  Medications Ordered in UC Medications - No data to display  Initial Impression / Assessment and Plan / UC Course  I have reviewed the triage vital signs and the nursing notes.  Pertinent labs & imaging results that were  available during my care of the patient were reviewed by me and considered in my medical decision making (see chart for details).   1.  Acute pain of right knee X-ray negative for acute bony abnormality to the right knee.  Shows severe tricompartmental arthrosis that is worst in the medial compartment.  Patient likely is experiencing a flareup of inflammation related to chronic arthritis and bone spurs.  Plan to prescribe 20 mg of prednisone once daily to be taken in the mornings for the next 5 days to decrease inflammation to the right knee causing significant pain.  Patient is unable to take NSAIDs due to anaphylactic reaction to diclofenac as well as kidney disease/kidney cancer.  Prescribed Augmentin antibiotic to be taken twice daily for the next 7 days to treat infected abrasion to the right knee.  Advised patient to take these medicines with food to avoid stomach upset.  She may use Tylenol 1000 mg every 6 hours as needed for pain and discomfort to the right knee.  Wound cleansed and dressed with a nonstick gauze dressing and bacitracin ointment in the clinic.  Patient to place mupirocin ointment to the wound twice daily and change the dressings twice daily with nonstick gauze and tape.  Knee sleeve applied to provide stability and compression to the right knee to reduce swelling.  Advised patient to rest, ice, compress, and elevate the right knee over the next few days to reduce swelling and pain.  She has a PCP appointment scheduled for next Wednesday and may follow-up at that time to ensure appropriate healing of the abrasion to the right knee.  Patient agreeable with this plan.   Discussed physical exam and available lab work findings in clinic with patient.  Counseled patient regarding appropriate use of medications and potential side effects for all medications recommended or prescribed today. Discussed red flag signs and symptoms of worsening condition,when to call the PCP office, return to  urgent care, and when to seek higher level of care in the emergency department. Patient verbalizes understanding and agreement with plan. All questions answered. Patient discharged in stable condition.  Final Clinical Impressions(s) /  UC Diagnoses   Final diagnoses:  Acute pain of right knee  Fall in home, initial encounter  Arthritis     Discharge Instructions      Your knee x-ray was negative for acute fracture.  X-ray shows significant arthritis to the right knee that is likely causing acute inflammation and pain related to your fall.  I would like for you to start taking prednisone 20 mg every morning with breakfast for 5 days starting tomorrow.  Make sure you take this medicine with food to avoid stomach upset.   Start taking Augmentin antibiotic to treat infected wound to your right knee as result of your fall. Take Augmentin 2 times daily for the next 7 days.   Place mupirocin ointment to your wound and change the dressing with nonstick gauze 2 times daily.  Wear the knee brace provided in clinic to reduce swelling and provide stability to the knee. Rest, ice, and elevate your right knee over the next few days to reduce swelling as well.   You may take Tylenol 1000 mg every 6 hours as needed for discomfort.  Follow-up with PCP as scheduled on Wednesday.  If you develop any new or worsening symptoms or do not improve in the next 2 to 3 days, please return.  If your symptoms are severe, please go to the emergency room.  Follow-up with your primary care provider for further evaluation and management of your symptoms as well as ongoing wellness visits.  I hope you feel better!   ED Prescriptions     Medication Sig Dispense Auth. Provider   amoxicillin-clavulanate (AUGMENTIN) 875-125 MG tablet Take 1 tablet by mouth every 12 (twelve) hours. 14 tablet Joella Prince M, FNP   predniSONE (DELTASONE) 20 MG tablet Take 1 tablet (20 mg total) by mouth daily. 5 tablet Joella Prince M, FNP   acetaminophen (TYLENOL) 500 MG tablet Take 2 tablets (1,000 mg total) by mouth every 6 (six) hours as needed for mild pain. 30 tablet Talbot Grumbling, FNP   mupirocin ointment (BACTROBAN) 2 % Apply 1 Application topically 2 (two) times daily. 22 g Talbot Grumbling, FNP      PDMP not reviewed this encounter.   Talbot Grumbling, Pablo Pena 02/09/22 1909

## 2022-02-13 DIAGNOSIS — S81001A Unspecified open wound, right knee, initial encounter: Secondary | ICD-10-CM | POA: Diagnosis not present

## 2022-02-27 ENCOUNTER — Other Ambulatory Visit: Payer: Self-pay | Admitting: Internal Medicine

## 2022-02-27 DIAGNOSIS — Z5111 Encounter for antineoplastic chemotherapy: Secondary | ICD-10-CM | POA: Diagnosis not present

## 2022-02-27 DIAGNOSIS — C678 Malignant neoplasm of overlapping sites of bladder: Secondary | ICD-10-CM | POA: Diagnosis not present

## 2022-03-05 DIAGNOSIS — S81001D Unspecified open wound, right knee, subsequent encounter: Secondary | ICD-10-CM | POA: Diagnosis not present

## 2022-03-05 DIAGNOSIS — M25561 Pain in right knee: Secondary | ICD-10-CM | POA: Diagnosis not present

## 2022-03-06 DIAGNOSIS — Z5111 Encounter for antineoplastic chemotherapy: Secondary | ICD-10-CM | POA: Diagnosis not present

## 2022-03-06 DIAGNOSIS — C678 Malignant neoplasm of overlapping sites of bladder: Secondary | ICD-10-CM | POA: Diagnosis not present

## 2022-03-13 DIAGNOSIS — Z5111 Encounter for antineoplastic chemotherapy: Secondary | ICD-10-CM | POA: Diagnosis not present

## 2022-03-13 DIAGNOSIS — C678 Malignant neoplasm of overlapping sites of bladder: Secondary | ICD-10-CM | POA: Diagnosis not present

## 2022-03-15 DIAGNOSIS — G4733 Obstructive sleep apnea (adult) (pediatric): Secondary | ICD-10-CM | POA: Diagnosis not present

## 2022-03-19 DIAGNOSIS — Z5111 Encounter for antineoplastic chemotherapy: Secondary | ICD-10-CM | POA: Diagnosis not present

## 2022-03-19 DIAGNOSIS — C678 Malignant neoplasm of overlapping sites of bladder: Secondary | ICD-10-CM | POA: Diagnosis not present

## 2022-03-27 DIAGNOSIS — Z5111 Encounter for antineoplastic chemotherapy: Secondary | ICD-10-CM | POA: Diagnosis not present

## 2022-03-27 DIAGNOSIS — C678 Malignant neoplasm of overlapping sites of bladder: Secondary | ICD-10-CM | POA: Diagnosis not present

## 2022-03-28 ENCOUNTER — Other Ambulatory Visit: Payer: Self-pay | Admitting: Internal Medicine

## 2022-03-30 ENCOUNTER — Other Ambulatory Visit: Payer: Self-pay | Admitting: Obstetrics and Gynecology

## 2022-03-30 DIAGNOSIS — N63 Unspecified lump in unspecified breast: Secondary | ICD-10-CM

## 2022-03-30 DIAGNOSIS — N6320 Unspecified lump in the left breast, unspecified quadrant: Secondary | ICD-10-CM | POA: Diagnosis not present

## 2022-04-02 NOTE — Progress Notes (Unsigned)
[facility] [place]  ASSESSMENT / PLAN: Morgan Torres is a 71 y.o. female with atherosclerosis of native arteries of right lower extremity causing  atypical symptoms .  Counseled the patient that she has no clear limb threatening symptoms, but does have worrisome noninvasive findings.  Recommend the following which can slow the progression of atherosclerosis and reduce the risk of major adverse cardiac / limb events:  Complete cessation from all tobacco products. Blood glucose control with goal A1c < 7%. Blood pressure control with goal blood pressure < 140/90 mmHg. Lipid reduction therapy with goal LDL-C <100 mg/dL (<70 if symptomatic from PAD).  Aspirin '81mg'$  PO QD.  Atorvastatin 40-'80mg'$  PO QD (or other "high intensity" statin therapy). Daily walking to and past the point of discomfort. Patient counseled to keep a log of exercise distance.   I counseled the patient that should she develop symptoms of chronic limb threatening ischemia (e.g. continuous pain in the foot, or ulceration about the foot) that she should present to care as soon as possible.  Otherwise we will see her in 6 months with repeat ABI.  CHIEF COMPLAINT: right foot numbness  HISTORY OF PRESENT ILLNESS: Morgan Torres is a 71 y.o. female who presents to clinic for evaluation of peripheral arterial disease. She report chronic fatigue. She describes exercise intolerance. She reports numbness in the left foot and a sensation of coldness. The patient does not report classic symptoms of claudication. She does report being awoken by discomfort in the right foot, but no constant pain relieved by dependency. She denies ulceration about the right foot.  06/27/20: Returns to clinic for evaluation.  She reports no real change in her symptoms.  She does not have ischemic rest pain in her right foot.  She has no ulcers about her right foot.  Past Medical History:  Diagnosis Date   Anxiety    Arthritis    hip,  lumbar spine    Asthma    seasonal allergies    Bronchitis    Hx: of   Chronic kidney disease    COPD (chronic obstructive pulmonary disease) (HCC)    Coronary artery disease    Depression    Fibromyalgia    Hypertension    Lumbar herniated disc    Myocardial infarction ([place]) 06/25/2012   followed by Dr. Debara Pickett, treated medically, no stents   Peripheral arterial disease ([place])    of right foot   Pneumonia    Hx: of yrs ago   Sciatica    Sleep apnea    Small bowel obstruction ([place])    several yrs ago    Past Surgical History:  Procedure Laterality Date   ABDOMINAL SURGERY     resection of small intestine   ANTERIOR CRUCIATE LIGAMENT REPAIR Bilateral    BACK SURGERY     fusion of lumbar   CARDIAC CATHETERIZATION  06/25/2012   COLON SURGERY     resection for bowel - for obstruction  6 to 7 yrs ago per pt on 01-05-2022   COLONOSCOPY     Hx; of   DILATION AND CURETTAGE OF UTERUS     EYE SURGERY Bilateral    cataracts removed   HERNIA REPAIR  02/23/1969   inguinal hernia    LEFT HEART CATHETERIZATION WITH CORONARY ANGIOGRAM N/A 03/30/2013   Procedure: LEFT HEART CATHETERIZATION WITH CORONARY ANGIOGRAM;  Surgeon: Leonie Man, MD;  [facility] CATH LAB;  Service: Cardiovascular;  Laterality: N/A;   left thumb  surgery     yrs ago   ROBOT ASSITED LAPAROSCOPIC NEPHROURETERECTOMY Left 03/09/2021   Procedure: XI ROBOT ASSITED LAPAROSCOPIC NEPHROURETERECTOMY/ CYSTOSCOPY WITH LEFT URETEROSCOPY WITH TRANSURETHRAL RESECTION OF URETERAL ORIFICE;  Surgeon: Ceasar Mons, MD;  Location: [facility];  Service: Urology;  Laterality: Left;   SALIVARY GLAND SURGERY Left    approached from inside mouth & side of neck 1970-1980   TONSILLECTOMY     as child   TOTAL HIP ARTHROPLASTY Left 05/28/2014   Procedure: LEFT TOTAL HIP ARTHROPLASTY;  Surgeon: Yvette Rack., MD;  Location: [facility];  Service: Orthopedics;  Laterality: Left;   TRANSURETHRAL RESECTION OF BLADDER TUMOR N/A  08/23/2021   Procedure: TRANSURETHRAL RESECTION OF BLADDER TUMOR (TURBT) WITH CYSTOSCOPY/ POSTOPERATIVE INSTILLATION OF GEMCITABINE/retrograde pyelogram and stent placement (right);  Surgeon: Ceasar Mons, MD;  Location[facility];  Service: Urology;  Laterality: N/A;   TRANSURETHRAL RESECTION OF BLADDER TUMOR N/A 01/10/2022   Procedure: TRANSURETHRAL RESECTION OF BLADDER TUMOR (TURBT) WITH CYSTOSCOPY / GEMCITABINE INSTILLATION post-operatively;  Surgeon: Ceasar Mons, MD;  Location: [facility];  Service: Urology;  Laterality: N/A;  ONLY NEEDS 27 MIN    Family History  Problem Relation Age of Onset   Hypertension Mother    Diabetes Mother    Cancer - Other Mother    Cancer Sister     Social History   Socioeconomic History   Marital status: Married    Spouse name: Not on file   Number of children: Not on file   Years of education: Not on file   Highest education level: Not on file  Occupational History   Not on file  Tobacco Use   Smoking status: Every Day    Packs/day: 0.50    Years: 25.00    Total pack years: 12.50    Types: Cigarettes   Smokeless tobacco: Never   Tobacco comments:    Currently on the Nicoderm patch."smokes a few"  Vaping Use   Vaping Use: Never used  Substance and Sexual Activity   Alcohol use: Yes    Comment: occasionally   Drug use: No   Sexual activity: Not Currently  Other Topics Concern   Not on file  Social History Narrative   Not on file   Social Determinants of Health   Financial Resource Strain: Not on file  Food Insecurity: Not on file  Transportation Needs: Not on file  Physical Activity: Not on file  Stress: Not on file  Social Connections: Not on file  Intimate Partner Violence: Not on file    Allergies  Allergen Reactions   Diclofenac Anaphylaxis   Codeine Itching   Hydrocodone Itching   Ciprofloxacin Rash    Current Outpatient Medications  Medication Sig  Dispense Refill   acetaminophen (TYLENOL) 500 MG tablet Take 2 tablets (1,000 mg total) by mouth every 6 (six) hours as needed for mild pain. 30 tablet 0   albuterol (PROVENTIL HFA;VENTOLIN HFA) 108 (90 Base) MCG/ACT inhaler Inhale 2 puffs into the lungs every 6 (six) hours as needed for wheezing or shortness of breath. 1 Inhaler 0   amLODipine (NORVASC) 5 MG tablet TAKE 1 TABLET BY MOUTH EVERY DAY 60 tablet 1   amoxicillin-clavulanate (AUGMENTIN) 875-125 MG tablet Take 1 tablet by mouth every 12 (twelve) hours. 14 tablet 0   aspirin EC 81 MG tablet Take 81 mg by mouth daily. Swallow whole.     atorvastatin (LIPITOR) 20 MG tablet Take 20 mg by  mouth. Monday wed and Friday in am     buPROPion (WELLBUTRIN SR) 150 MG 12 hr tablet Take 150 mg by mouth 2 (two) times daily.     cilostazol (PLETAL) 100 MG tablet Take 100 mg by mouth 2 (two) times daily.     EPINEPHrine (EPIPEN 2-PAK) 0.3 mg/0.3 mL DEVI Inject 0.3 mLs (0.3 mg total) into the muscle once as needed (for severe allergic reaction). CAll 911 immediately if you have to use this medicine 1 Device 1   LORazepam (ATIVAN) 0.5 MG tablet Take 0.5 mg by mouth daily as needed for anxiety.     losartan-hydrochlorothiazide (HYZAAR) 100-12.5 MG tablet Take 1 tablet by mouth daily.     metoprolol tartrate (LOPRESSOR) 25 MG tablet Take 1 tablet (25 mg total) by mouth 2 (two) times daily. PLEASE KEEP APPOINTMENT FOR FUTURE REFILLS 90 tablet 0   montelukast (SINGULAIR) 10 MG tablet Take 10 mg by mouth at bedtime. (Patient not taking: Reported on 01/05/2022)     mupirocin ointment (BACTROBAN) 2 % Apply 1 Application topically 2 (two) times daily. 22 g 0   nicotine (NICODERM CQ - DOSED IN MG/24 HOURS) 21 mg/24hr patch Place 21 mg onto the skin daily.     nicotine polacrilex (NICORETTE) 4 MG gum Take 4 mg by mouth as needed for smoking cessation.     nitroGLYCERIN (NITROSTAT) 0.4 MG SL tablet Place 1 tablet (0.4 mg total) under the tongue every 5 (five) minutes x 3  doses as needed for chest pain. 25 tablet 2   oxybutynin (DITROPAN) 5 MG tablet Take 1 tablet (5 mg total) by mouth every 8 (eight) hours as needed for bladder spasms. (Patient not taking: Reported on 01/03/2022) 30 tablet 1   oxyCODONE-acetaminophen (PERCOCET/ROXICET) 5-325 MG tablet Take by mouth every 4 (four) hours as needed for severe pain. (Patient not taking: Reported on 01/05/2022)     phenazopyridine (PYRIDIUM) 200 MG tablet Take 1 tablet by mouth 3 (three) times daily as needed (for pain with urination). (Patient not taking: Reported on 01/03/2022) 30 tablet 0   polyethylene glycol (MIRALAX / GLYCOLAX) 17 g packet Take 17 g by mouth as needed.     predniSONE (DELTASONE) 20 MG tablet Take 1 tablet (20 mg total) by mouth daily. 5 tablet 0   traMADol (ULTRAM) 50 MG tablet Take by mouth every 6 (six) hours as needed. Not taking     No current facility-administered medications for this visit.    REVIEW OF SYSTEMS:  '[X]'$  denotes positive finding, '[ ]'$  denotes negative finding Cardiac  Comments:  Chest pain or chest pressure:    Shortness of breath upon exertion:    Short of breath when lying flat:    Irregular heart rhythm:        Vascular    Pain in calf, thigh, or hip brought on by ambulation:    Pain in feet at night that wakes you up from your sleep:     Blood clot in your veins:    Leg swelling:         Pulmonary    Oxygen at home:    Productive cough:     Wheezing:         Neurologic    Sudden weakness in arms or legs:     Sudden numbness in arms or legs:     Sudden onset of difficulty speaking or slurred speech:    Temporary loss of vision in one eye:     Problems  with dizziness:         Gastrointestinal    Blood in stool:     Vomited blood:         Genitourinary    Burning when urinating:     Blood in urine:        Psychiatric    Major depression:         Hematologic    Bleeding problems:    Problems with blood clotting too easily:        Skin    Rashes or  ulcers:        Constitutional    Fever or chills:      PHYSICAL EXAM There were no vitals filed for this visit.   Constitutional: well appearing. no distress. Appears well nourished.  Neurologic: CN intact. no focal findings. no sensory loss. Psychiatric:  Mood and affect symmetric and appropriate. Eyes:  No icterus. No conjunctival pallor. Ears, nose, throat:  mucous membranes moist. Midline trachea.  Cardiac: regular rate and rhythm.  Respiratory:  unlabored. Abdominal:  soft, non-tender, non-distended.  Peripheral vascular: no palpable pedal or popliteal pulses Extremity: no edema. no cyanosis. no pallor.  Skin: no gangrene. no ulceration.  Lymphatic: no Stemmer's sign. no palpable lymphadenopathy.  PERTINENT LABORATORY AND RADIOLOGIC DATA  Most recent CBC    Latest Ref Rng & Units 01/10/2022   11:43 AM 08/23/2021    7:24 AM 03/10/2021    3:43 AM  CBC  Hemoglobin 12.0 - 15.0 g/dL 13.9  13.6  13.5   Hematocrit 36.0 - 46.0 % 41.0  40.0  40.2      Most recent CMP    Latest Ref Rng & Units 01/10/2022   11:43 AM 08/23/2021    7:24 AM 03/11/2021    4:13 AM  CMP  Glucose 70 - 99 mg/dL 96  107  117   BUN 8 - 23 mg/dL '28  31  15   '$ Creatinine 0.44 - 1.00 mg/dL 1.40  1.50  1.43   Sodium 135 - 145 mmol/L 138  138  134   Potassium 3.5 - 5.1 mmol/L 4.4  4.3  4.6   Chloride 98 - 111 mmol/L 106  109  98   CO2 22 - 32 mmol/L   27   Calcium 8.9 - 10.3 mg/dL   8.7     Renal function CrCl cannot be calculated (Patient's most recent lab result is older than the maximum 21 days allowed.).  Hgb A1c MFr Bld (%)  Date Value  03/29/2013 6.0 (H)    LDL Cholesterol  Date Value Ref Range Status  03/30/2013 159 (H) 0 - 99 mg/dL Final    Comment:           Total Cholesterol/HDL:CHD Risk Coronary Heart Disease Risk Table                     Men   Women  1/2 Average Risk   3.4   3.3  Average Risk       5.0   4.4  2 X Average Risk   9.6   7.1  3 X Average Risk  23.4   11.0         Use the calculated Patient Ratio above and the CHD Risk Table to determine the patient's CHD Risk.        ATP III CLASSIFICATION (LDL):  <100     mg/dL   Optimal  100-129  mg/dL   Near or  Above                    Optimal  130-159  mg/dL   Borderline  160-189  mg/dL   High  >190     mg/dL   Very High     Vascular Imaging:  +-------+-----------+-----------+------------+------------+  ABI/TBIToday's ABIToday's TBIPrevious ABIPrevious TBI  +-------+-----------+-----------+------------+------------+  Right  0.58       0.00       0.48        0.00          +-------+-----------+-----------+------------+------------+  Left   0.88       0.40       1.13        0.41          +-------+-----------+-----------+------------+------------+    Yevonne Aline. Stanford Breed, MD [facility] Phone Number: [phone] 04/02/2022 7:41 PM  Total time spent on preparing this encounter including chart review, data review, collecting history, examining the patient, coordinating care for this established patient, 20 minutes  Portions of this report may have been transcribed using voice recognition software.  Every effort has been made to ensure accuracy; however, inadvertent computerized transcription errors may still be present.

## 2022-04-03 ENCOUNTER — Other Ambulatory Visit: Payer: Self-pay | Admitting: Internal Medicine

## 2022-04-03 ENCOUNTER — Ambulatory Visit: Payer: Medicare Other | Admitting: Vascular Surgery

## 2022-04-03 ENCOUNTER — Ambulatory Visit (HOSPITAL_COMMUNITY)
Admission: RE | Admit: 2022-04-03 | Discharge: 2022-04-03 | Disposition: A | Discharge status: Home / Self Care | Payer: Medicare Other | Source: Ambulatory Visit | Attending: Vascular Surgery | Admitting: Vascular Surgery

## 2022-04-03 ENCOUNTER — Encounter: Payer: Self-pay | Admitting: Vascular Surgery

## 2022-04-03 VITALS — BP 149/71 | HR 60 | Temp 98.4°F | Resp 20 | Ht 68.0 in | Wt 189.0 lb

## 2022-04-03 DIAGNOSIS — I739 Peripheral vascular disease, unspecified: Secondary | ICD-10-CM | POA: Diagnosis not present

## 2022-04-03 DIAGNOSIS — C678 Malignant neoplasm of overlapping sites of bladder: Secondary | ICD-10-CM | POA: Diagnosis not present

## 2022-04-03 DIAGNOSIS — Z5111 Encounter for antineoplastic chemotherapy: Secondary | ICD-10-CM | POA: Diagnosis not present

## 2022-04-03 DIAGNOSIS — C652 Malignant neoplasm of left renal pelvis: Secondary | ICD-10-CM | POA: Diagnosis not present

## 2022-04-04 ENCOUNTER — Ambulatory Visit
Admission: RE | Admit: 2022-04-04 | Discharge: 2022-04-04 | Disposition: A | Discharge status: Home / Self Care | Payer: Medicare Other | Source: Ambulatory Visit | Attending: Obstetrics and Gynecology | Admitting: Obstetrics and Gynecology

## 2022-04-04 ENCOUNTER — Other Ambulatory Visit: Payer: Self-pay | Admitting: Obstetrics and Gynecology

## 2022-04-04 ENCOUNTER — Telehealth: Payer: Self-pay | Admitting: Internal Medicine

## 2022-04-04 DIAGNOSIS — N63 Unspecified lump in unspecified breast: Secondary | ICD-10-CM

## 2022-04-04 DIAGNOSIS — N6489 Other specified disorders of breast: Secondary | ICD-10-CM | POA: Diagnosis not present

## 2022-04-04 DIAGNOSIS — R928 Other abnormal and inconclusive findings on diagnostic imaging of breast: Secondary | ICD-10-CM | POA: Diagnosis not present

## 2022-04-04 NOTE — Telephone Encounter (Signed)
*  STAT* If patient is at the pharmacy, call can be transferred to refill team.   1. Which medications need to be refilled? (please list name of each medication and dose if known)   metoprolol tartrate (LOPRESSOR) 25 MG tablet    2. Which pharmacy/location (including street and city if local pharmacy) is medication to be sent to?  [id number]- [place], [place] - [address]  3. Do they need a 30 day or 90 day supply? 90  Pt made an appt for 04/18/22 with APP LQuentin Ore pt completely out

## 2022-04-05 MED ORDER — METOPROLOL TARTRATE 25 MG PO TABS: 25.0000 mg | ORAL_TABLET | Freq: Two times a day (BID) | ORAL | 1 refills | Status: DC

## 2022-04-11 ENCOUNTER — Other Ambulatory Visit: Payer: Medicare Other

## 2022-04-16 NOTE — Progress Notes (Deleted)
Cardiology Office Note:    Date:  04/16/2022   ID:  Morgan Torres, DOB 1950-10-21, MRN [id number]  PCP:  [name], [place] Cardiologist: Pixie Casino, MD   Reason for visit: 1 year follow-up/ med refill  History of Present Illness:    Morgan Torres is a 71 y.o. female with a hx of nonobstructive CAD, Takotubo cardiomyopathy, HTN, HLD, OSA, tobacco use, COPD, CKD.  She had an NSTEMI in 2014, cath showed moderate nonobstructive CAD.  Felt to likely be Takotsubo cardiomyopathy, subsequent echo showed EF 55%.  He last saw Dr. Gardiner Rhyme in August 2022 for preop evaluation after CT showed kidney cancer.  Patient had moderate dyspnea on exertion, needing to stop halfway up a flight of stairs to catch her breath.  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  Patient had a normal Lexiscan stress test.  2D echo showed EF 60 to 65%, mild LVH, normal RV and no significant valve disease.  She was cleared for surgery.  Today, ***  Coronary artery disease, *** -NSTEMI 2014, LHC showed moderate nonobstructive disease -Stress test August 2022: Negative for ischemia -***  Dyspnea on exertion -Negative stress test 01/2021 -Echo 01/2021: Normal EF, no significant valve disease  Hypertension -*** -Goal BP is <130/80.  Recommend DASH diet (high in vegetables, fruits, low-fat dairy products, whole grains, poultry, fish, and nuts and low in sweets, sugar-sweetened beverages, and red meats), salt restriction and increase physical activity.  Hyperlipidemia -*** -Discussed cholesterol lowering diets - Mediterranean diet, DASH diet, vegetarian diet, low-carbohydrate diet and avoidance of trans fats.  Discussed healthier choice substitutes.  Nuts, high-fiber foods, and fiber supplements may also improve lipids.    Obesity -Discussed how even a 5-10% weight loss can have cardiovascular benefits.   -Recommend moderate intensity activity for 30 minutes 5 days/week and the DASH  diet.  Disposition - Follow-up in ***     Past Medical History:  Diagnosis Date   Anxiety    Arthritis    hip, lumbar spine    Asthma    seasonal allergies    Bronchitis    Hx: of   Chronic kidney disease    COPD (chronic obstructive pulmonary disease) (HCC)    Coronary artery disease    Depression    Fibromyalgia    Hypertension    Lumbar herniated disc    Myocardial infarction ([place]) 06/25/2012   followed by Dr. Debara Pickett, treated medically, no stents   Peripheral arterial disease ([place])    of right foot   Pneumonia    Hx: of yrs ago   Sciatica    Sleep apnea    Small bowel obstruction ([place])    several yrs ago    Past Surgical History:  Procedure Laterality Date   ABDOMINAL SURGERY     resection of small intestine   ANTERIOR CRUCIATE LIGAMENT REPAIR Bilateral    BACK SURGERY     fusion of lumbar   CARDIAC CATHETERIZATION  06/25/2012   COLON SURGERY     resection for bowel - for obstruction  6 to 7 yrs ago per pt on 01-05-2022   COLONOSCOPY     Hx; of   DILATION AND CURETTAGE OF UTERUS     EYE SURGERY Bilateral    cataracts removed   HERNIA REPAIR  02/23/1969   inguinal hernia    LEFT HEART CATHETERIZATION WITH CORONARY ANGIOGRAM N/A 03/30/2013   Procedure: LEFT HEART CATHETERIZATION WITH CORONARY ANGIOGRAM;  Surgeon: Leonie Green  Ellyn Hack, MD;  Location: [facility] CATH LAB;  Service: Cardiovascular;  Laterality: N/A;   left thumb surgery     yrs ago   ROBOT ASSITED LAPAROSCOPIC NEPHROURETERECTOMY Left 03/09/2021   Procedure: XI ROBOT ASSITED LAPAROSCOPIC NEPHROURETERECTOMY/ CYSTOSCOPY WITH LEFT URETEROSCOPY WITH TRANSURETHRAL RESECTION OF URETERAL ORIFICE;  Surgeon: Ceasar Mons, MD;  Location: [facility];  Service: Urology;  Laterality: Left;   SALIVARY GLAND SURGERY Left    approached from inside mouth & side of neck 1970-1980   TONSILLECTOMY     as child   TOTAL HIP ARTHROPLASTY Left 05/28/2014   Procedure: LEFT TOTAL HIP ARTHROPLASTY;  Surgeon: Yvette Rack., MD;  Location: [facility];  Service: Orthopedics;  Laterality: Left;   TRANSURETHRAL RESECTION OF BLADDER TUMOR N/A 08/23/2021   Procedure: TRANSURETHRAL RESECTION OF BLADDER TUMOR (TURBT) WITH CYSTOSCOPY/ POSTOPERATIVE INSTILLATION OF GEMCITABINE/retrograde pyelogram and stent placement (right);  Surgeon: Ceasar Mons, MD;  Location: [facility];  Service: Urology;  Laterality: N/A;   TRANSURETHRAL RESECTION OF BLADDER TUMOR N/A 01/10/2022   Procedure: TRANSURETHRAL RESECTION OF BLADDER TUMOR (TURBT) WITH CYSTOSCOPY / GEMCITABINE INSTILLATION post-operatively;  Surgeon: Ceasar Mons, MD;  Location: [facility];  Service: Urology;  Laterality: N/A;  ONLY NEEDS 30 MIN    Current Medications: No outpatient medications have been marked as taking for the 04/18/22 encounter (Appointment) with Warren Lacy, PA-C.     Allergies:   Diclofenac, Codeine, Hydrocodone, and Ciprofloxacin   Social History   Socioeconomic History   Marital status: Married    Spouse name: Not on file   Number of children: Not on file   Years of education: Not on file   Highest education level: Not on file  Occupational History   Not on file  Tobacco Use   Smoking status: Every Day    Packs/day: 0.50    Years: 25.00    Total pack years: 12.50    Types: Cigarettes   Smokeless tobacco: Never   Tobacco comments:    Currently on the Nicoderm patch."smokes a few"  Vaping Use   Vaping Use: Never used  Substance and Sexual Activity   Alcohol use: Yes    Comment: occasionally   Drug use: No   Sexual activity: Not Currently  Other Topics Concern   Not on file  Social History Narrative   Not on file   Social Determinants of Health   Financial Resource Strain: Not on file  Food Insecurity: Not on file  Transportation Needs: Not on file  Physical Activity: Not on file  Stress: Not on file  Social Connections: Not on file     Family History: The  patient's family history includes Breast cancer in her daughter; Cancer in her sister; Cancer - Other in her mother; Diabetes in her mother; Hypertension in her mother.  ROS:   Please see the history of present illness.     EKGs/Labs/Other Studies Reviewed:    EKG:  The ekg ordered today demonstrates ***  Recent Labs: 01/10/2022: BUN 28; Creatinine, Ser 1.40; Hemoglobin 13.9; Potassium 4.4; Sodium 138   Recent Lipid Panel Lab Results  Component Value Date/Time   CHOL 231 (H) 03/30/2013 09:29 AM   TRIG 105 03/30/2013 09:29 AM   HDL 51 03/30/2013 09:29 AM   LDLCALC 159 (H) 03/30/2013 09:29 AM    Physical Exam:    VS:  There were no vitals taken for this visit.   No data found.  No BP recorded.  {  Refresh Note OR Click here to enter BP  :1}***    Wt Readings from Last 3 Encounters:  04/03/22 189 lb (85.7 kg)  02/09/22 196 lb (88.9 kg)  01/10/22 191 lb 9.6 oz (86.9 kg)     GEN: *** Well nourished, well developed in no acute distress HEENT: Normal NECK: No JVD; No carotid bruits CARDIAC: ***RRR, no murmurs, rubs, gallops RESPIRATORY:  Clear to auscultation without rales, wheezing or rhonchi  ABDOMEN: Soft, non-tender, non-distended MUSCULOSKELETAL: No edema SKIN: Warm and dry NEUROLOGIC:  Alert and oriented PSYCHIATRIC:  Normal affect     ASSESSMENT AND PLAN   ***   {Are you ordering a CV Procedure (e.g. stress test, cath, DCCV, TEE, etc)?   Press F2        :[id number]}    Medication Adjustments/Labs and Tests Ordered: Current medicines are reviewed at length with the patient today.  Concerns regarding medicines are outlined above.  No orders of the defined types were placed in this encounter.  No orders of the defined types were placed in this encounter.   There are no Patient Instructions on file for this visit.   Signed, Warren Lacy, PA-C  04/16/2022 9:14 AM    [place]

## 2022-04-18 ENCOUNTER — Ambulatory Visit: Payer: Medicare Other | Attending: Physician Assistant | Admitting: Physician Assistant

## 2022-04-18 DIAGNOSIS — I10 Essential (primary) hypertension: Secondary | ICD-10-CM

## 2022-04-18 DIAGNOSIS — I2510 Atherosclerotic heart disease of native coronary artery without angina pectoris: Secondary | ICD-10-CM

## 2022-04-18 DIAGNOSIS — E782 Mixed hyperlipidemia: Secondary | ICD-10-CM

## 2022-04-20 DIAGNOSIS — C652 Malignant neoplasm of left renal pelvis: Secondary | ICD-10-CM | POA: Diagnosis not present

## 2022-04-24 DIAGNOSIS — M25511 Pain in right shoulder: Secondary | ICD-10-CM | POA: Diagnosis not present

## 2022-05-08 DIAGNOSIS — R918 Other nonspecific abnormal finding of lung field: Secondary | ICD-10-CM | POA: Diagnosis not present

## 2022-05-08 DIAGNOSIS — C652 Malignant neoplasm of left renal pelvis: Secondary | ICD-10-CM | POA: Diagnosis not present

## 2022-05-08 DIAGNOSIS — K869 Disease of pancreas, unspecified: Secondary | ICD-10-CM | POA: Diagnosis not present

## 2022-05-10 ENCOUNTER — Telehealth: Payer: Self-pay | Admitting: Internal Medicine

## 2022-05-10 NOTE — Telephone Encounter (Signed)
Spoke with patient confirming new patient appointment 12/6

## 2022-05-12 ENCOUNTER — Other Ambulatory Visit: Payer: Self-pay | Admitting: Internal Medicine

## 2022-05-30 ENCOUNTER — Inpatient Hospital Stay: Payer: Medicare Other | Attending: Internal Medicine | Admitting: Internal Medicine

## 2022-05-30 ENCOUNTER — Inpatient Hospital Stay: Payer: Medicare Other

## 2022-05-30 ENCOUNTER — Other Ambulatory Visit: Payer: Self-pay

## 2022-05-30 VITALS — BP 157/65 | HR 76 | Temp 98.2°F | Resp 16 | Wt 187.7 lb

## 2022-05-30 DIAGNOSIS — Z808 Family history of malignant neoplasm of other organs or systems: Secondary | ICD-10-CM | POA: Insufficient documentation

## 2022-05-30 DIAGNOSIS — Z803 Family history of malignant neoplasm of breast: Secondary | ICD-10-CM | POA: Insufficient documentation

## 2022-05-30 DIAGNOSIS — R5383 Other fatigue: Secondary | ICD-10-CM

## 2022-05-30 DIAGNOSIS — F17210 Nicotine dependence, cigarettes, uncomplicated: Secondary | ICD-10-CM | POA: Diagnosis not present

## 2022-05-30 DIAGNOSIS — G4730 Sleep apnea, unspecified: Secondary | ICD-10-CM | POA: Insufficient documentation

## 2022-05-30 DIAGNOSIS — Z8249 Family history of ischemic heart disease and other diseases of the circulatory system: Secondary | ICD-10-CM | POA: Insufficient documentation

## 2022-05-30 DIAGNOSIS — Z809 Family history of malignant neoplasm, unspecified: Secondary | ICD-10-CM

## 2022-05-30 DIAGNOSIS — Z79899 Other long term (current) drug therapy: Secondary | ICD-10-CM | POA: Diagnosis not present

## 2022-05-30 DIAGNOSIS — C7800 Secondary malignant neoplasm of unspecified lung: Secondary | ICD-10-CM | POA: Insufficient documentation

## 2022-05-30 DIAGNOSIS — M797 Fibromyalgia: Secondary | ICD-10-CM | POA: Insufficient documentation

## 2022-05-30 DIAGNOSIS — Z885 Allergy status to narcotic agent status: Secondary | ICD-10-CM | POA: Insufficient documentation

## 2022-05-30 DIAGNOSIS — C679 Malignant neoplasm of bladder, unspecified: Secondary | ICD-10-CM | POA: Insufficient documentation

## 2022-05-30 DIAGNOSIS — F419 Anxiety disorder, unspecified: Secondary | ICD-10-CM | POA: Insufficient documentation

## 2022-05-30 DIAGNOSIS — Z8051 Family history of malignant neoplasm of kidney: Secondary | ICD-10-CM | POA: Insufficient documentation

## 2022-05-30 DIAGNOSIS — I10 Essential (primary) hypertension: Secondary | ICD-10-CM | POA: Insufficient documentation

## 2022-05-30 DIAGNOSIS — Z833 Family history of diabetes mellitus: Secondary | ICD-10-CM | POA: Diagnosis not present

## 2022-05-30 DIAGNOSIS — Z7902 Long term (current) use of antithrombotics/antiplatelets: Secondary | ICD-10-CM | POA: Insufficient documentation

## 2022-05-30 DIAGNOSIS — C7910 Secondary malignant neoplasm of unspecified urinary organs: Secondary | ICD-10-CM

## 2022-05-30 DIAGNOSIS — I2510 Atherosclerotic heart disease of native coronary artery without angina pectoris: Secondary | ICD-10-CM | POA: Insufficient documentation

## 2022-05-30 DIAGNOSIS — I252 Old myocardial infarction: Secondary | ICD-10-CM | POA: Insufficient documentation

## 2022-05-30 DIAGNOSIS — Z881 Allergy status to other antibiotic agents status: Secondary | ICD-10-CM | POA: Insufficient documentation

## 2022-05-30 DIAGNOSIS — Z5111 Encounter for antineoplastic chemotherapy: Secondary | ICD-10-CM | POA: Insufficient documentation

## 2022-05-30 DIAGNOSIS — J4489 Other specified chronic obstructive pulmonary disease: Secondary | ICD-10-CM | POA: Insufficient documentation

## 2022-05-30 NOTE — Progress Notes (Signed)
START ON PATHWAY REGIMEN - Bladder     A cycle is every 21 days:     Gemcitabine      Carboplatin   **Always confirm dose/schedule in your pharmacy ordering system**  Patient Characteristics: Advanced/Metastatic Disease, First Line, No Prior Platinum-Based Therapy OR Prior Platinum-Based Therapy and Relapse > 12 Months, Poor Renal Function (CrCl < 60 mL/min), Initial Chemotherapy Preferred Therapeutic Status: Advanced/Metastatic Disease Line of Therapy: First Line Prior Therapy and Relapse Status: No Prior Platinum-Based Therapy Renal Function: Poor Renal Function (CrCl < 60 mL/min) Intent of Therapy: Non-Curative / Palliative Intent, Discussed with Patient

## 2022-05-30 NOTE — Progress Notes (Signed)
[facility] [phone]   [phone]  CONSULT NOTE  REFERRING PHYSICIAN: Dr. Ellison Hughs  REASON FOR CONSULTATION:  71 years old African-American female with metastatic urothelial carcinoma.  HPI Morgan Torres is a 71 y.o. female with past medical history significant for COPD, coronary artery disease status postmyocardial infarction, and anxiety, hypertension, fibromyalgia, sleep apnea as well as long history of smoking.  The patient mentioned that in July 2022 she presented with gross hematuria and she had CT scan of the abdomen and pelvis performed on January 02, 2021 and that showed 3.6 x 3.6 x 3.6 cm somewhat lobular relatively hemogenius lesion in the upper pole of the left kidney extending into the collecting system and then out into the renal pelvis to the level of possibly involving the UPJ.  This tissue in the collecting system and renal pelvis appears to enhance after IV contrast administration suggesting tumor related soft tissue.  The imaging features were compatible with transitional cell carcinoma and no evidence for metastatic disease in the abdomen or pelvis.  She was seen by Dr. Lovena Neighbours and on 03/09/2021 she underwent cystoscopy with left ureteroscopy, left ureteral stent placement and TUR of the left ureteral orifice.  She also underwent robotic assisted laparoscopic left nephroureterectomy.  The final pathology ([id number]) showed high-grade papillary urothelial carcinoma of the renal pelvis measuring 5.5 cm.  The carcinoma invades the muscularis (pT2).  All margins of resection were negative for tumor.  There was one lymph node dissected and it was negative for malignancy.  On August 23, 2021 the patient underwent cystoscopy with TURPT small, right ureteral stent placement and right retrograde pyelogram with intravesical instillation of gemcitabine for multiple papillary bladder tumors measuring 0.2 to 1.0 cm throughout the bladder mucosa  that was found on surveillance cystoscopy in January 2023 again under the care of Dr. Lovena Neighbours.  On January 04, 2022 she has a follow-up and surveillance cystoscopy by Dr. Lovena Neighbours and on January 10, 2022 she underwent again repeat cystoscopy with TURBT and intravesical instillation of gemcitabine.  The patient was followed by observation and on May 08, 2022 she had CT scan of the chest, abdomen and pelvis without contrast and it showed increased size of the left lower lobe pulmonary nodule with multiple new solid bilateral pulmonary nodules compatible with metastatic disease.  There was no convincing evidence of metastatic disease in the abdomen or pelvis.  There was a stable prominent retroperitoneal lymph nodes nonspecific and need continuous monitoring.  There was also hypodense 0.7 cm lesion in the pancreatic uncinate process and 0.4 cm lesion in the tail of the pancreas without suspicious postcontrast enhancement and favored to reflect a small sidebranch IPMN's there was also stable prominent retroperitoneal lymph node that is nonspecific. Dr. Lovena Neighbours kindly referred the patient to me today for evaluation and recommendation regarding treatment of her condition especially with the new findings. When seen today she is very anxious about her condition.  She has no significant chest pain, shortness of breath but has mild cough with no hemoptysis.  She has no nausea, vomiting, diarrhea or constipation.  She has no headache or visual changes.  She has no recent weight loss or night sweats. Family history significant for mother with high blood pressure.  Sister had kidney cancer.  Daughter had breast cancer and father had bone cancer. The patient has a partner.  She had 2 children, 1 living daughter and 1 deceased son.  She was accompanied by her girlfriend  Daphne.  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  She has a history of smoking 1 pack/day for around 40 years and  still smoking.  She drinks alcohol occasionally and also smokes marijuana.  HPI  Past Medical History:  Diagnosis Date   Anxiety    Arthritis    hip, lumbar spine    Asthma    seasonal allergies    Bronchitis    Hx: of   Chronic kidney disease    COPD (chronic obstructive pulmonary disease) (HCC)    Coronary artery disease    Depression    Fibromyalgia    Hypertension    Lumbar herniated disc    Myocardial infarction ([place]) 06/25/2012   followed by Dr. Debara Pickett, treated medically, no stents   Peripheral arterial disease ([place])    of right foot   Pneumonia    Hx: of yrs ago   Sciatica    Sleep apnea    Small bowel obstruction ([place])    several yrs ago    Past Surgical History:  Procedure Laterality Date   ABDOMINAL SURGERY     resection of small intestine   ANTERIOR CRUCIATE LIGAMENT REPAIR Bilateral    BACK SURGERY     fusion of lumbar   CARDIAC CATHETERIZATION  06/25/2012   COLON SURGERY     resection for bowel - for obstruction  6 to 7 yrs ago per pt on 01-05-2022   COLONOSCOPY     Hx; of   DILATION AND CURETTAGE OF UTERUS     EYE SURGERY Bilateral    cataracts removed   HERNIA REPAIR  02/23/1969   inguinal hernia    LEFT HEART CATHETERIZATION WITH CORONARY ANGIOGRAM N/A 03/30/2013   Procedure: LEFT HEART CATHETERIZATION WITH CORONARY ANGIOGRAM;  Surgeon: Leonie Man, MD;  Location: [facility] CATH LAB;  Service: Cardiovascular;  Laterality: N/A;   left thumb surgery     yrs ago   ROBOT ASSITED LAPAROSCOPIC NEPHROURETERECTOMY Left 03/09/2021   Procedure: XI ROBOT ASSITED LAPAROSCOPIC NEPHROURETERECTOMY/ CYSTOSCOPY WITH LEFT URETEROSCOPY WITH TRANSURETHRAL RESECTION OF URETERAL ORIFICE;  Surgeon: Ceasar Mons, MD;  Location: [facility];  Service: Urology;  Laterality: Left;   SALIVARY GLAND SURGERY Left    approached from inside mouth & side of neck 1970-1980   TONSILLECTOMY     as child   TOTAL HIP ARTHROPLASTY Left 05/28/2014   Procedure: LEFT TOTAL HIP  ARTHROPLASTY;  Surgeon: Yvette Rack., MD;  Location: [facility];  Service: Orthopedics;  Laterality: Left;   TRANSURETHRAL RESECTION OF BLADDER TUMOR N/A 08/23/2021   Procedure: TRANSURETHRAL RESECTION OF BLADDER TUMOR (TURBT) WITH CYSTOSCOPY/ POSTOPERATIVE INSTILLATION OF GEMCITABINE/retrograde pyelogram and stent placement (right);  Surgeon: Ceasar Mons, MD;  Location: [facility];  Service: Urology;  Laterality: N/A;   TRANSURETHRAL RESECTION OF BLADDER TUMOR N/A 01/10/2022   Procedure: TRANSURETHRAL RESECTION OF BLADDER TUMOR (TURBT) WITH CYSTOSCOPY / GEMCITABINE INSTILLATION post-operatively;  Surgeon: Ceasar Mons, MD;  Location: [facility];  Service: Urology;  Laterality: N/A;  ONLY NEEDS 49 MIN    Family History  Problem Relation Age of Onset   Hypertension Mother    Diabetes Mother    Cancer - Other Mother    Cancer Sister    Breast cancer Daughter     Social History Social History   Tobacco Use   Smoking status: Every Day    Packs/day: 0.50    Years:  25.00    Total pack years: 12.50    Types: Cigarettes   Smokeless tobacco: Never   Tobacco comments:    Currently on the Nicoderm patch."smokes a few"  Vaping Use   Vaping Use: Never used  Substance Use Topics   Alcohol use: Yes    Comment: occasionally   Drug use: No    Allergies  Allergen Reactions   Diclofenac Anaphylaxis   Codeine Itching   Hydrocodone Itching   Ciprofloxacin Rash    Current Outpatient Medications  Medication Sig Dispense Refill   acetaminophen (TYLENOL) 500 MG tablet Take 2 tablets (1,000 mg total) by mouth every 6 (six) hours as needed for mild pain. 30 tablet 0   albuterol (PROVENTIL HFA;VENTOLIN HFA) 108 (90 Base) MCG/ACT inhaler Inhale 2 puffs into the lungs every 6 (six) hours as needed for wheezing or shortness of breath. 1 Inhaler 0   amLODipine (NORVASC) 5 MG tablet TAKE 1 TABLET BY MOUTH EVERY DAY 90 tablet 0   aspirin EC 81 MG  tablet Take 81 mg by mouth daily. Swallow whole.     atorvastatin (LIPITOR) 20 MG tablet Take 20 mg by mouth. Monday wed and Friday in am     buPROPion (WELLBUTRIN SR) 150 MG 12 hr tablet Take 150 mg by mouth 2 (two) times daily.     cilostazol (PLETAL) 100 MG tablet Take 100 mg by mouth 2 (two) times daily.     EPINEPHrine (EPIPEN 2-PAK) 0.3 mg/0.3 mL DEVI Inject 0.3 mLs (0.3 mg total) into the muscle once as needed (for severe allergic reaction). CAll 911 immediately if you have to use this medicine 1 Device 1   LORazepam (ATIVAN) 0.5 MG tablet Take 0.5 mg by mouth daily as needed for anxiety.     losartan-hydrochlorothiazide (HYZAAR) 100-12.5 MG tablet Take 1 tablet by mouth daily.     metoprolol tartrate (LOPRESSOR) 25 MG tablet Take 1 tablet (25 mg total) by mouth 2 (two) times daily. PLEASE KEEP APPOINTMENT FOR FUTURE REFILLS 90 tablet 1   montelukast (SINGULAIR) 10 MG tablet Take 10 mg by mouth at bedtime.     mupirocin ointment (BACTROBAN) 2 % Apply 1 Application topically 2 (two) times daily. 22 g 0   nicotine (NICODERM CQ - DOSED IN MG/24 HOURS) 21 mg/24hr patch Place 21 mg onto the skin daily.     nicotine polacrilex (NICORETTE) 4 MG gum Take 4 mg by mouth as needed for smoking cessation.     nitroGLYCERIN (NITROSTAT) 0.4 MG SL tablet Place 1 tablet (0.4 mg total) under the tongue every 5 (five) minutes x 3 doses as needed for chest pain. 25 tablet 2   oxybutynin (DITROPAN) 5 MG tablet Take 1 tablet (5 mg total) by mouth every 8 (eight) hours as needed for bladder spasms. 30 tablet 1   phenazopyridine (PYRIDIUM) 200 MG tablet Take 1 tablet by mouth 3 (three) times daily as needed (for pain with urination). 30 tablet 0   polyethylene glycol (MIRALAX / GLYCOLAX) 17 g packet Take 17 g by mouth as needed.     traMADol (ULTRAM) 50 MG tablet Take by mouth every 6 (six) hours as needed. Not taking     No current facility-administered medications for this visit.    Review of  Systems  Constitutional: positive for fatigue Eyes: negative Ears, nose, mouth, throat, and face: negative Respiratory: positive for cough Cardiovascular: negative Gastrointestinal: negative Genitourinary:negative Integument/breast: negative Hematologic/lymphatic: negative Musculoskeletal:negative Neurological: negative Behavioral/Psych: negative Endocrine: negative Allergic/Immunologic: negative  Physical Exam  ZTI:WPYKD, healthy, no distress, well nourished, well developed, and anxious SKIN: skin color, texture, turgor are normal, no rashes or significant lesions HEAD: Normocephalic, No masses, lesions, tenderness or abnormalities EYES: normal, PERRLA, Conjunctiva are pink and non-injected EARS: External ears normal, Canals clear OROPHARYNX:no exudate, no erythema, and lips, buccal mucosa, and tongue normal  NECK: supple, no adenopathy, no JVD LYMPH:  no palpable lymphadenopathy, no hepatosplenomegaly BREAST:not examined LUNGS: clear to auscultation , and palpation HEART: regular rate & rhythm, no murmurs, and no gallops ABDOMEN:abdomen soft, non-tender, normal bowel sounds, and no masses or organomegaly BACK: Back symmetric, no curvature., No CVA tenderness EXTREMITIES:no joint deformities, effusion, or inflammation, no edema  NEURO: alert & oriented x 3 with fluent speech, no focal motor/sensory deficits  PERFORMANCE STATUS: ECOG 1  LABORATORY DATA: Lab Results  Component Value Date   WBC 6.8 02/28/2021   HGB 13.9 01/10/2022   HCT 41.0 01/10/2022   MCV 97.0 02/28/2021   PLT 296 02/28/2021      Chemistry      Component Value Date/Time   NA 138 01/10/2022 1143   K 4.4 01/10/2022 1143   CL 106 01/10/2022 1143   CO2 27 03/11/2021 0413   BUN 28 (H) 01/10/2022 1143   CREATININE 1.40 (H) 01/10/2022 1143      Component Value Date/Time   CALCIUM 8.7 (L) 03/11/2021 0413   ALKPHOS 71 06/03/2014 0041   AST 18 06/03/2014 0041   ALT 17 06/03/2014 0041   BILITOT 0.4  06/03/2014 0041       RADIOGRAPHIC STUDIES: No results found.  ASSESSMENT: This is a very pleasant 71 years old African-American female with now metastatic urothelial carcinoma that was initially diagnosed as stage II (T2, N0, M0) in September 2022 status post right nephroureterectomy as well as intravesical treatment and resection of superficial tumor in the bladder several times and March 2023 as well as July 2023.  The patient was found to have enlarging and new pulmonary nodules consistent with metastatic disease in November 2023.   PLAN: I had a lengthy discussion with the patient and her friend today about her current disease stage, prognosis and treatment options. I personally and independently reviewed the scan images and discussed the result with the patient and her friend. I explained to the patient that she has incurable condition at this point and all the treatment will be of palliative nature. I gave the patient the option of palliative care and hospice referral versus consideration of palliative systemic chemotherapy with carboplatin for AUC of 5 on day 1 and gemcitabine 1000 mg/M2 on days 1 and 8 every 3 weeks.  The patient will not be a good candidate for treatment with cisplatin because of her renal insufficiency and left nephrectomy. The patient is interested in treatment.  I discussed with her the adverse effect of this treatment including but not limited to alopecia, myelosuppression, nausea and vomiting, peripheral neuropathy, liver or renal dysfunction She is expected to start the first dose of this treatment after Christmas. I will arrange for the patient a follow-up appointment with me at that time. I will also consider sending her tissue block for molecular studies and PD-L1 expression. The patient will have a chemotherapy education class before the first dose of her treatment. I will call her pharmacy with prescription for Compazine 10 mg p.o. every 6 hours as needed for  nausea as well as Emla cream to be applied to the Port-A-Cath site before treatment. I will also  arrange for the patient to have Port-A-Cath placed before the first cycle of her treatment. The patient was advised to call immediately if she has any other concerning symptoms in the interval. The patient voices understanding of current disease status and treatment options and is in agreement with the current care plan.  All questions were answered. The patient knows to call the clinic with any problems, questions or concerns. We can certainly see the patient much sooner if necessary.  Thank you so much for allowing me to participate in the care of Morgan Torres. I will continue to follow up the patient with you and assist in her care.  The total time spent in the appointment was 90 minutes.  Disclaimer: This note was dictated with voice recognition software. Similar sounding words can inadvertently be transcribed and may not be corrected upon review.   Eilleen Kempf May 30, 2022, 1:49 PM

## 2022-06-01 ENCOUNTER — Other Ambulatory Visit: Payer: Self-pay

## 2022-06-03 ENCOUNTER — Other Ambulatory Visit: Payer: Self-pay

## 2022-06-04 ENCOUNTER — Other Ambulatory Visit: Payer: Self-pay

## 2022-06-06 ENCOUNTER — Other Ambulatory Visit: Payer: Self-pay | Admitting: Radiology

## 2022-06-07 ENCOUNTER — Other Ambulatory Visit: Payer: Self-pay | Admitting: Internal Medicine

## 2022-06-07 ENCOUNTER — Encounter (HOSPITAL_COMMUNITY): Payer: Self-pay

## 2022-06-07 ENCOUNTER — Other Ambulatory Visit: Payer: Self-pay

## 2022-06-07 ENCOUNTER — Ambulatory Visit (HOSPITAL_COMMUNITY)
Admission: RE | Admit: 2022-06-07 | Discharge: 2022-06-07 | Disposition: A | Discharge status: Home / Self Care | Payer: Medicare Other | Source: Ambulatory Visit | Attending: Internal Medicine | Admitting: Internal Medicine

## 2022-06-07 DIAGNOSIS — Z452 Encounter for adjustment and management of vascular access device: Secondary | ICD-10-CM | POA: Diagnosis not present

## 2022-06-07 DIAGNOSIS — G4730 Sleep apnea, unspecified: Secondary | ICD-10-CM | POA: Diagnosis not present

## 2022-06-07 DIAGNOSIS — I129 Hypertensive chronic kidney disease with stage 1 through stage 4 chronic kidney disease, or unspecified chronic kidney disease: Secondary | ICD-10-CM | POA: Diagnosis not present

## 2022-06-07 DIAGNOSIS — I2510 Atherosclerotic heart disease of native coronary artery without angina pectoris: Secondary | ICD-10-CM | POA: Diagnosis not present

## 2022-06-07 DIAGNOSIS — C7910 Secondary malignant neoplasm of unspecified urinary organs: Secondary | ICD-10-CM | POA: Insufficient documentation

## 2022-06-07 DIAGNOSIS — J449 Chronic obstructive pulmonary disease, unspecified: Secondary | ICD-10-CM | POA: Diagnosis not present

## 2022-06-07 DIAGNOSIS — F17210 Nicotine dependence, cigarettes, uncomplicated: Secondary | ICD-10-CM | POA: Diagnosis not present

## 2022-06-07 DIAGNOSIS — N189 Chronic kidney disease, unspecified: Secondary | ICD-10-CM | POA: Insufficient documentation

## 2022-06-07 DIAGNOSIS — F329 Major depressive disorder, single episode, unspecified: Secondary | ICD-10-CM | POA: Insufficient documentation

## 2022-06-07 DIAGNOSIS — I739 Peripheral vascular disease, unspecified: Secondary | ICD-10-CM | POA: Insufficient documentation

## 2022-06-07 DIAGNOSIS — C7800 Secondary malignant neoplasm of unspecified lung: Secondary | ICD-10-CM | POA: Diagnosis not present

## 2022-06-07 DIAGNOSIS — C679 Malignant neoplasm of bladder, unspecified: Secondary | ICD-10-CM | POA: Diagnosis not present

## 2022-06-07 DIAGNOSIS — Z5111 Encounter for antineoplastic chemotherapy: Secondary | ICD-10-CM

## 2022-06-07 HISTORY — PX: IR IMAGING GUIDED PORT INSERTION: IMG5740

## 2022-06-07 MED ORDER — MIDAZOLAM HCL 2 MG/2ML IJ SOLN
INTRAMUSCULAR | Status: AC
  Filled 2022-06-07: qty 2

## 2022-06-07 MED ORDER — FENTANYL CITRATE (PF) 100 MCG/2ML IJ SOLN
INTRAMUSCULAR | Status: AC
  Filled 2022-06-07: qty 2

## 2022-06-07 MED ORDER — FENTANYL CITRATE (PF) 100 MCG/2ML IJ SOLN
INTRAMUSCULAR | Status: AC | PRN
  Administered 2022-06-07 (×4): 50 ug via INTRAVENOUS

## 2022-06-07 MED ORDER — SODIUM CHLORIDE 0.9 % IV SOLN: INTRAVENOUS | Status: DC

## 2022-06-07 MED ORDER — MIDAZOLAM HCL 2 MG/2ML IJ SOLN
INTRAMUSCULAR | Status: AC | PRN
  Administered 2022-06-07 (×4): 1 mg via INTRAVENOUS

## 2022-06-07 MED ORDER — HEPARIN SOD (PORK) LOCK FLUSH 100 UNIT/ML IV SOLN
INTRAVENOUS | Status: AC
  Administered 2022-06-07: 500 [IU] via INTRAVENOUS
  Filled 2022-06-07: qty 5

## 2022-06-07 MED ORDER — LIDOCAINE-EPINEPHRINE 1 %-1:100000 IJ SOLN
INTRAMUSCULAR | Status: AC
  Administered 2022-06-07: 15 mL via INTRADERMAL
  Filled 2022-06-07: qty 1

## 2022-06-07 NOTE — Procedures (Signed)
Interventional Radiology Procedure Note  Procedure: Placement of a right IJ approach single lumen PowerPort.  Tip is positioned at the superior cavoatrial junction and catheter is ready for immediate use.  Complications: No immediate Recommendations:  - Ok to shower tomorrow - Do not submerge for 7 days - Routine line care   Signed,  Taquila Leys K. Kieron Kantner, MD   

## 2022-06-07 NOTE — Consult Note (Signed)
Chief Complaint: Patient was seen in consultation today for Port-A-Cath placement  Referring Physician(s): Mohamed,Mohamed  Supervising Physician: Jacqulynn Cadet  Patient Status: [facility] - Out-pt  History of Present Illness: Morgan Torres is a 71 y.o. female smoker with past medical history of anxiety/depression, arthritis, asthma, COPD, chronic kidney disease, prior bowel obstruction with resection, coronary artery disease with prior MI, fibromyalgia, hypertension,peripheral vascular disease, sleep apnea who presents now with metastatic urothelial carcinoma that was initially diagnosed as stage II in September 2022.  She is status post right nephroureterectomy as well as intravesical treatment and resection of superficial tumor in the bladder several times March 2023 as well as July 2023.  She now has new and enlarging pulmonary nodules consistent with metastatic disease in November of this year.  She presents now for Port-A-Cath placement to assist with treatment.    Past Medical History:  Diagnosis Date   Anxiety    Arthritis    hip, lumbar spine    Asthma    seasonal allergies    Bronchitis    Hx: of   Chronic kidney disease    COPD (chronic obstructive pulmonary disease) (HCC)    Coronary artery disease    Depression    Fibromyalgia    Hypertension    Lumbar herniated disc    Myocardial infarction ([place]) 06/25/2012   followed by Dr. Debara Pickett, treated medically, no stents   Peripheral arterial disease ([place])    of right foot   Pneumonia    Hx: of yrs ago   Sciatica    Sleep apnea    Small bowel obstruction ([place])    several yrs ago    Past Surgical History:  Procedure Laterality Date   ABDOMINAL SURGERY     resection of small intestine   ANTERIOR CRUCIATE LIGAMENT REPAIR Bilateral    BACK SURGERY     fusion of lumbar   CARDIAC CATHETERIZATION  06/25/2012   COLON SURGERY     resection for bowel - for obstruction  6 to 7 yrs ago per pt on 01-05-2022    COLONOSCOPY     Hx; of   DILATION AND CURETTAGE OF UTERUS     EYE SURGERY Bilateral    cataracts removed   HERNIA REPAIR  02/23/1969   inguinal hernia    LEFT HEART CATHETERIZATION WITH CORONARY ANGIOGRAM N/A 03/30/2013   Procedure: LEFT HEART CATHETERIZATION WITH CORONARY ANGIOGRAM;  Surgeon: Leonie Man, MD;  Location: [facility] CATH LAB;  Service: Cardiovascular;  Laterality: N/A;   left thumb surgery     yrs ago   ROBOT ASSITED LAPAROSCOPIC NEPHROURETERECTOMY Left 03/09/2021   Procedure: XI ROBOT ASSITED LAPAROSCOPIC NEPHROURETERECTOMY/ CYSTOSCOPY WITH LEFT URETEROSCOPY WITH TRANSURETHRAL RESECTION OF URETERAL ORIFICE;  Surgeon: Ceasar Mons, MD;  Location: [facility] ORS;  Service: Urology;  Laterality: Left;   SALIVARY GLAND SURGERY Left    approached from inside mouth & side of neck 1970-1980   TONSILLECTOMY     as child   TOTAL HIP ARTHROPLASTY Left 05/28/2014   Procedure: LEFT TOTAL HIP ARTHROPLASTY;  Surgeon: Yvette Rack., MD;  Location: [place];  Service: Orthopedics;  Laterality: Left;   TRANSURETHRAL RESECTION OF BLADDER TUMOR N/A 08/23/2021   Procedure: TRANSURETHRAL RESECTION OF BLADDER TUMOR (TURBT) WITH CYSTOSCOPY/ POSTOPERATIVE INSTILLATION OF GEMCITABINE/retrograde pyelogram and stent placement (right);  Surgeon: Ceasar Mons, MD;  Location: [place];  Service: Urology;  Laterality: N/A;   TRANSURETHRAL RESECTION OF BLADDER TUMOR N/A 01/10/2022   Procedure:  TRANSURETHRAL RESECTION OF BLADDER TUMOR (TURBT) WITH CYSTOSCOPY / GEMCITABINE INSTILLATION post-operatively;  Surgeon: Ceasar Mons, MD;  Location: [facility];  Service: Urology;  Laterality: N/A;  ONLY NEEDS 30 MIN    Allergies: Diclofenac, Codeine, Hydrocodone, and Ciprofloxacin  Medications: Prior to Admission medications   Medication Sig Start Date End Date Taking? Authorizing Provider  acetaminophen (TYLENOL) 500 MG tablet Take 2 tablets (1,000 mg  total) by mouth every 6 (six) hours as needed for mild pain. 02/09/22   Talbot Grumbling, FNP  albuterol (PROVENTIL HFA;VENTOLIN HFA) 108 (90 Base) MCG/ACT inhaler Inhale 2 puffs into the lungs every 6 (six) hours as needed for wheezing or shortness of breath. 05/10/16   Katy Apo, NP  amLODipine (NORVASC) 5 MG tablet TAKE 1 TABLET BY MOUTH EVERY DAY 05/14/22   Hilty, Nadean Corwin, MD  aspirin EC 81 MG tablet Take 81 mg by mouth daily. Swallow whole.    [provider]  atorvastatin (LIPITOR) 20 MG tablet Take 20 mg by mouth. Monday wed and Friday in am 11/25/20   [provider]  buPROPion (WELLBUTRIN SR) 150 MG 12 hr tablet Take 150 mg by mouth 2 (two) times daily.    [provider]  cilostazol (PLETAL) 100 MG tablet Take 100 mg by mouth 2 (two) times daily.    [provider]  EPINEPHrine (EPIPEN 2-PAK) 0.3 mg/0.3 mL DEVI Inject 0.3 mLs (0.3 mg total) into the muscle once as needed (for severe allergic reaction). CAll 911 immediately if you have to use this medicine 11/26/12   Piepenbrink, Anderson Malta, PA-C  LORazepam (ATIVAN) 0.5 MG tablet Take 0.5 mg by mouth daily as needed for anxiety.    [provider]  losartan-hydrochlorothiazide (HYZAAR) 100-12.5 MG tablet Take 1 tablet by mouth daily.    [provider]  metoprolol tartrate (LOPRESSOR) 25 MG tablet Take 1 tablet (25 mg total) by mouth 2 (two) times daily. PLEASE KEEP APPOINTMENT FOR FUTURE REFILLS 04/05/22   Pixie Casino, MD  montelukast (SINGULAIR) 10 MG tablet Take 10 mg by mouth at bedtime.    [provider]  mupirocin ointment (BACTROBAN) 2 % Apply 1 Application topically 2 (two) times daily. 02/09/22   Talbot Grumbling, FNP  nicotine (NICODERM CQ - DOSED IN MG/24 HOURS) 21 mg/24hr patch Place 21 mg onto the skin daily.    [provider]  nicotine polacrilex (NICORETTE) 4 MG gum Take 4 mg by mouth as needed for smoking cessation.    [provider]  nitroGLYCERIN (NITROSTAT) 0.4 MG SL tablet Place 1 tablet (0.4 mg total) under the tongue every 5 (five) minutes x 3 doses as needed for chest pain. 09/25/18   Hilty, Nadean Corwin, MD  oxybutynin (DITROPAN) 5 MG tablet Take 1 tablet (5 mg total) by mouth every 8 (eight) hours as needed for bladder spasms. 08/23/21   Ceasar Mons, MD  phenazopyridine (PYRIDIUM) 200 MG tablet Take 1 tablet by mouth 3 (three) times daily as needed (for pain with urination). 08/23/21 08/23/22  Ceasar Mons, MD  polyethylene glycol (MIRALAX / GLYCOLAX) 17 g packet Take 17 g by mouth as needed.    [provider]  traMADol (ULTRAM) 50 MG tablet Take by mouth every 6 (six) hours as needed. Not taking    [provider]     Family History  Problem Relation Age of Onset   Hypertension Mother    Diabetes Mother    Cancer -  Other Mother    Cancer Sister    Breast cancer Daughter     Social History   Socioeconomic History   Marital status: Married    Spouse name: Not on file   Number of children: Not on file   Years of education: Not on file   Highest education level: Not on file  Occupational History   Not on file  Tobacco Use   Smoking status: Every Day    Packs/day: 0.50    Years: 25.00    Total pack years: 12.50    Types: Cigarettes   Smokeless tobacco: Never   Tobacco comments:    Currently on the Nicoderm patch."smokes a few"  Vaping Use   Vaping Use: Never used  Substance and Sexual Activity   Alcohol use: Yes    Comment: occasionally   Drug use: No   Sexual activity: Not Currently  Other Topics Concern   Not on file  Social History Narrative   Not on file   Social Determinants of Health   Financial Resource Strain: Not on file  Food Insecurity: Not on file  Transportation Needs: Not on file  Physical Activity: Not on file  Stress: Not on file  Social Connections: Not on file      Review of Systems:  denies fever,HA,CP, worsening  dyspnea, N/V or bleeding; she does have occ cough, left lat abd/back discomfort and is anxious  Vital Signs: Vitals:   06/07/22 1152  BP: (!) 142/72  Pulse: (!) 59  Resp: 16  Temp: 98 F (36.7 C)  SpO2: 96%         Physical Exam awake/alert; chest- distant but clear BS bilat; heart- sl bradycardic but reg rhythm; abd- soft,+BS, some mild left lat tenderness; no LE edema  Imaging: No results found.  Labs:  CBC: Recent Labs    08/23/21 0724 01/10/22 1143  HGB 13.6 13.9  HCT 40.0 41.0    COAGS: No results for input(s): "INR", "APTT" in the last 8760 hours.  BMP: Recent Labs    08/23/21 0724 01/10/22 1143  NA 138 138  K 4.3 4.4  CL 109 106  GLUCOSE 107* 96  BUN 31* 28*  CREATININE 1.50* 1.40*    LIVER FUNCTION TESTS: No results for input(s): "BILITOT", "AST", "ALT", "ALKPHOS", "PROT", "ALBUMIN" in the last 8760 hours.  TUMOR MARKERS: No results for input(s): "AFPTM", "CEA", "CA199", "CHROMGRNA" in the last 8760 hours.  Assessment and Plan: 71 y.o. female smoker with past medical history of anxiety/depression, arthritis, asthma, COPD, chronic kidney disease, prior bowel obstruction with resection, coronary artery disease with prior MI, fibromyalgia, hypertension,peripheral vascular disease, sleep apnea who presents now with metastatic urothelial carcinoma that was initially diagnosed as stage II in September 2022.  She is status post right nephroureterectomy as well as intravesical treatment and resection of superficial tumor in the bladder several times March 2023 as well as July 2023.  She now has new and enlarging pulmonary nodules consistent with metastatic disease in November of this year.  She presents now for Port-A-Cath placement to assist with treatment.Risks and benefits of image guided port-a-catheter placement was discussed with the patient including, but not limited to bleeding, infection, pneumothorax, or fibrin sheath development and need for  additional procedures.  All of the patient's questions were answered, patient is agreeable to proceed. Consent signed and in chart.    Thank you for this interesting consult.  I greatly enjoyed meeting AUNE ADAMI and look forward to participating in  their care.  A copy of this report was sent to the requesting provider on this date.  Electronically Signed: D. Rowe Robert, PA-C 06/07/2022, 11:41 AM   I spent a total of  25 minutes in face to face in clinical consultation, greater than 50% of which was counseling/coordinating care for port a cath placement

## 2022-06-11 DIAGNOSIS — J449 Chronic obstructive pulmonary disease, unspecified: Secondary | ICD-10-CM | POA: Diagnosis not present

## 2022-06-11 DIAGNOSIS — C7800 Secondary malignant neoplasm of unspecified lung: Secondary | ICD-10-CM | POA: Diagnosis not present

## 2022-06-11 DIAGNOSIS — Z720 Tobacco use: Secondary | ICD-10-CM | POA: Diagnosis not present

## 2022-06-11 DIAGNOSIS — G4733 Obstructive sleep apnea (adult) (pediatric): Secondary | ICD-10-CM | POA: Diagnosis not present

## 2022-06-11 DIAGNOSIS — I10 Essential (primary) hypertension: Secondary | ICD-10-CM | POA: Diagnosis not present

## 2022-06-11 DIAGNOSIS — C679 Malignant neoplasm of bladder, unspecified: Secondary | ICD-10-CM | POA: Diagnosis not present

## 2022-06-11 DIAGNOSIS — C7910 Secondary malignant neoplasm of unspecified urinary organs: Secondary | ICD-10-CM | POA: Diagnosis not present

## 2022-06-12 NOTE — Progress Notes (Signed)
Pharmacist Chemotherapy Monitoring - Initial Assessment    Anticipated start date: 06/21/22   The following has been reviewed per standard work regarding the patient's treatment regimen: The patient's diagnosis, treatment plan and drug doses, and organ/hematologic function Lab orders and baseline tests specific to treatment regimen  The treatment plan start date, drug sequencing, and pre-medications Prior authorization status  Patient's documented medication list, including drug-drug interaction screen and prescriptions for anti-emetics and supportive care specific to the treatment regimen The drug concentrations, fluid compatibility, administration routes, and timing of the medications to be used The patient's access for treatment and lifetime cumulative dose history, if applicable  The patient's medication allergies and previous infusion related reactions, if applicable   Changes made to treatment plan:  N/A  Follow up needed:  N/A   Kennith Center, Pharm.D., CPP 06/12/2022'@3'$ :29 PM

## 2022-06-19 ENCOUNTER — Telehealth: Payer: Self-pay

## 2022-06-19 NOTE — Telephone Encounter (Signed)
This nurse received a message from this patient stating that she is scheduled to receive her first treatment on 12/28 and she would like to know if it is ok for her to get her Covid and Flu shot before the first treatment.  This nurse has forwarded patients question to provider for advisement.  No further questions noted at this time.

## 2022-06-20 ENCOUNTER — Encounter: Payer: Self-pay | Admitting: Internal Medicine

## 2022-06-20 MED FILL — Dexamethasone Sodium Phosphate Inj 100 MG/10ML: INTRAMUSCULAR | Qty: 1 | Status: AC

## 2022-06-20 MED FILL — Fosaprepitant Dimeglumine For IV Infusion 150 MG (Base Eq): INTRAVENOUS | Qty: 5 | Status: AC

## 2022-06-20 NOTE — Progress Notes (Signed)
Called pt to introduce myself as her Arboriculturist and to discuss the [employer].  I went over what it covers and gave her the income requirement.  She would like to apply so she will bring her proof of income on 06/21/22.  If approved I will give her an expense sheet and my card for any questions or concerns she may have in the future.

## 2022-06-21 ENCOUNTER — Inpatient Hospital Stay: Payer: Medicare Other | Admitting: Internal Medicine

## 2022-06-21 ENCOUNTER — Inpatient Hospital Stay: Payer: Medicare Other

## 2022-06-21 ENCOUNTER — Inpatient Hospital Stay (HOSPITAL_BASED_OUTPATIENT_CLINIC_OR_DEPARTMENT_OTHER): Payer: Medicare Other

## 2022-06-21 ENCOUNTER — Other Ambulatory Visit: Payer: Self-pay

## 2022-06-21 ENCOUNTER — Encounter: Payer: Self-pay | Admitting: Internal Medicine

## 2022-06-21 VITALS — BP 108/53 | HR 64 | Temp 97.3°F | Resp 17 | Wt 187.6 lb

## 2022-06-21 DIAGNOSIS — C7910 Secondary malignant neoplasm of unspecified urinary organs: Secondary | ICD-10-CM

## 2022-06-21 DIAGNOSIS — C7800 Secondary malignant neoplasm of unspecified lung: Secondary | ICD-10-CM | POA: Diagnosis not present

## 2022-06-21 DIAGNOSIS — Z803 Family history of malignant neoplasm of breast: Secondary | ICD-10-CM | POA: Diagnosis not present

## 2022-06-21 DIAGNOSIS — I10 Essential (primary) hypertension: Secondary | ICD-10-CM | POA: Diagnosis not present

## 2022-06-21 DIAGNOSIS — Z885 Allergy status to narcotic agent status: Secondary | ICD-10-CM | POA: Diagnosis not present

## 2022-06-21 DIAGNOSIS — Z95828 Presence of other vascular implants and grafts: Secondary | ICD-10-CM

## 2022-06-21 DIAGNOSIS — Z8249 Family history of ischemic heart disease and other diseases of the circulatory system: Secondary | ICD-10-CM | POA: Diagnosis not present

## 2022-06-21 DIAGNOSIS — J4489 Other specified chronic obstructive pulmonary disease: Secondary | ICD-10-CM | POA: Diagnosis not present

## 2022-06-21 DIAGNOSIS — G4730 Sleep apnea, unspecified: Secondary | ICD-10-CM | POA: Diagnosis not present

## 2022-06-21 DIAGNOSIS — F419 Anxiety disorder, unspecified: Secondary | ICD-10-CM | POA: Diagnosis not present

## 2022-06-21 DIAGNOSIS — M797 Fibromyalgia: Secondary | ICD-10-CM | POA: Diagnosis not present

## 2022-06-21 DIAGNOSIS — Z881 Allergy status to other antibiotic agents status: Secondary | ICD-10-CM | POA: Diagnosis not present

## 2022-06-21 DIAGNOSIS — Z7902 Long term (current) use of antithrombotics/antiplatelets: Secondary | ICD-10-CM | POA: Diagnosis not present

## 2022-06-21 DIAGNOSIS — C679 Malignant neoplasm of bladder, unspecified: Secondary | ICD-10-CM | POA: Diagnosis not present

## 2022-06-21 DIAGNOSIS — F17210 Nicotine dependence, cigarettes, uncomplicated: Secondary | ICD-10-CM | POA: Diagnosis not present

## 2022-06-21 DIAGNOSIS — Z5111 Encounter for antineoplastic chemotherapy: Secondary | ICD-10-CM | POA: Diagnosis not present

## 2022-06-21 DIAGNOSIS — E785 Hyperlipidemia, unspecified: Secondary | ICD-10-CM | POA: Diagnosis not present

## 2022-06-21 DIAGNOSIS — Z808 Family history of malignant neoplasm of other organs or systems: Secondary | ICD-10-CM | POA: Diagnosis not present

## 2022-06-21 DIAGNOSIS — I252 Old myocardial infarction: Secondary | ICD-10-CM | POA: Diagnosis not present

## 2022-06-21 DIAGNOSIS — Z8051 Family history of malignant neoplasm of kidney: Secondary | ICD-10-CM | POA: Diagnosis not present

## 2022-06-21 DIAGNOSIS — I2510 Atherosclerotic heart disease of native coronary artery without angina pectoris: Secondary | ICD-10-CM | POA: Diagnosis not present

## 2022-06-21 DIAGNOSIS — Z79899 Other long term (current) drug therapy: Secondary | ICD-10-CM | POA: Diagnosis not present

## 2022-06-21 LAB — CMP (CANCER CENTER ONLY)
ALT: 10 U/L (ref 0–44)
AST: 13 U/L — ABNORMAL LOW (ref 15–41)
Albumin: 3.9 g/dL (ref 3.5–5.0)
Alkaline Phosphatase: 97 U/L (ref 38–126)
Anion gap: 5 (ref 5–15)
BUN: 24 mg/dL — ABNORMAL HIGH (ref 8–23)
CO2: 26 mmol/L (ref 22–32)
Calcium: 9.2 mg/dL (ref 8.9–10.3)
Chloride: 105 mmol/L (ref 98–111)
Creatinine: 1.24 mg/dL — ABNORMAL HIGH (ref 0.44–1.00)
GFR, Estimated: 46 mL/min — ABNORMAL LOW (ref >60)
Glucose, Bld: 103 mg/dL — ABNORMAL HIGH (ref 70–99)
Potassium: 4.3 mmol/L (ref 3.5–5.1)
Sodium: 136 mmol/L (ref 135–145)
Total Bilirubin: 0.4 mg/dL (ref 0.3–1.2)
Total Protein: 6.7 g/dL (ref 6.5–8.1)

## 2022-06-21 LAB — CBC WITH DIFFERENTIAL (CANCER CENTER ONLY)
Abs Immature Granulocytes: 0.01 10*3/uL (ref 0.00–0.07)
Basophils Absolute: 0.1 10*3/uL (ref 0.0–0.1)
Basophils Relative: 1 %
Eosinophils Absolute: 0.2 10*3/uL (ref 0.0–0.5)
Eosinophils Relative: 3 %
HCT: 38.2 % (ref 36.0–46.0)
Hemoglobin: 12.9 g/dL (ref 12.0–15.0)
Immature Granulocytes: 0 %
Lymphocytes Relative: 39 %
Lymphs Abs: 2.5 10*3/uL (ref 0.7–4.0)
MCH: 31.9 pg (ref 26.0–34.0)
MCHC: 33.8 g/dL (ref 30.0–36.0)
MCV: 94.6 fL (ref 80.0–100.0)
Monocytes Absolute: 0.6 10*3/uL (ref 0.1–1.0)
Monocytes Relative: 9 %
Neutro Abs: 3.1 10*3/uL (ref 1.7–7.7)
Neutrophils Relative %: 48 %
Platelet Count: 267 10*3/uL (ref 150–400)
RBC: 4.04 MIL/uL (ref 3.87–5.11)
RDW: 14 % (ref 11.5–15.5)
WBC Count: 6.4 10*3/uL (ref 4.0–10.5)
nRBC: 0 % (ref 0.0–0.2)

## 2022-06-21 MED ORDER — PROCHLORPERAZINE MALEATE 10 MG PO TABS: 10.0000 mg | ORAL_TABLET | Freq: Four times a day (QID) | ORAL | 0 refills | Status: DC | PRN

## 2022-06-21 MED ORDER — SODIUM CHLORIDE 0.9 % IV SOLN
10.0000 mg | Freq: Once | INTRAVENOUS | Status: AC
  Administered 2022-06-21: 10 mg via INTRAVENOUS
  Filled 2022-06-21: qty 10

## 2022-06-21 MED ORDER — HEPARIN SOD (PORK) LOCK FLUSH 100 UNIT/ML IV SOLN
500.0000 [IU] | Freq: Once | INTRAVENOUS | Status: AC | PRN
  Administered 2022-06-21: 500 [IU]

## 2022-06-21 MED ORDER — PALONOSETRON HCL INJECTION 0.25 MG/5ML
0.2500 mg | Freq: Once | INTRAVENOUS | Status: AC
  Administered 2022-06-21: 0.25 mg via INTRAVENOUS
  Filled 2022-06-21: qty 5

## 2022-06-21 MED ORDER — LIDOCAINE-PRILOCAINE 2.5-2.5 % EX CREA: TOPICAL_CREAM | CUTANEOUS | 0 refills | Status: DC

## 2022-06-21 MED ORDER — SODIUM CHLORIDE 0.9 % IV SOLN
1000.0000 mg/m2 | Freq: Once | INTRAVENOUS | Status: AC
  Administered 2022-06-21: 2015 mg via INTRAVENOUS
  Filled 2022-06-21: qty 52.99

## 2022-06-21 MED ORDER — SODIUM CHLORIDE 0.9 % IV SOLN
150.0000 mg | Freq: Once | INTRAVENOUS | Status: AC
  Administered 2022-06-21: 150 mg via INTRAVENOUS
  Filled 2022-06-21: qty 150

## 2022-06-21 MED ORDER — SODIUM CHLORIDE 0.9% FLUSH
10.0000 mL | INTRAVENOUS | Status: DC | PRN
  Administered 2022-06-21: 10 mL

## 2022-06-21 MED ORDER — SODIUM CHLORIDE 0.9 % IV SOLN: Freq: Once | INTRAVENOUS | Status: AC

## 2022-06-21 MED ORDER — SODIUM CHLORIDE 0.9 % IV SOLN
396.5000 mg | Freq: Once | INTRAVENOUS | Status: AC
  Administered 2022-06-21: 400 mg via INTRAVENOUS
  Filled 2022-06-21: qty 40

## 2022-06-21 MED ORDER — SODIUM CHLORIDE 0.9% FLUSH
10.0000 mL | INTRAVENOUS | Status: AC | PRN
  Administered 2022-06-21: 10 mL

## 2022-06-21 NOTE — Progress Notes (Signed)
Pt is approved for the $1000 [employer] grant.  

## 2022-06-21 NOTE — Progress Notes (Signed)
[facility] Telephone:([phone]) [phone]   Fax:(336) [phone]  OFFICE PROGRESS NOTE  Morgan Beard, MD [address]  [place] [address]  DIAGNOSIS: Metastatic urothelial carcinoma that was initially diagnosed as stage II (T2, N0, M0) in September 2022 status post right nephroureterectomy as well as intravesical treatment and resection of superficial tumor in the bladder several times and March 2023 as well as July 2023.  The patient was found to have enlarging and new pulmonary nodules consistent with metastatic disease in November 2023.   PRIOR THERAPY: None  CURRENT THERAPY: Palliative systemic chemotherapy with carboplatin for AUC of 5 on day 1 and gemcitabine 1000 mg/M2 on days 1 and 8 every 3 weeks.  First cycle June 21, 2022.  INTERVAL HISTORY: Morgan Torres 71 y.o. female returns to the clinic today for follow-up visit.  The patient is feeling fine today with no concerning complaints except for mild fatigue.  She denied having any current chest pain, shortness of breath, cough or hemoptysis.  She has no nausea, vomiting, diarrhea or constipation.  She has no headache or visual changes.  She has no recent weight loss or night sweats.  She is here today for evaluation before starting the first cycle of her treatment with chemotherapy.  MEDICAL HISTORY: Past Medical History:  Diagnosis Date   Anxiety    Arthritis    hip, lumbar spine    Asthma    seasonal allergies    Bronchitis    Hx: of   Chronic kidney disease    COPD (chronic obstructive pulmonary disease) (HCC)    Coronary artery disease    Depression    Fibromyalgia    Hypertension    Lumbar herniated disc    Myocardial infarction (Morgan Torres) 06/25/2012   followed by Dr. Debara Torres, treated medically, no stents   Peripheral arterial disease ([place])    of right foot   Pneumonia    Hx: of yrs ago   Sciatica    Sleep apnea    Small bowel obstruction (Morgan Torres)    several yrs ago    ALLERGIES:  is  allergic to diclofenac, codeine, hydrocodone, and ciprofloxacin.  MEDICATIONS:  Current Outpatient Medications  Medication Sig Dispense Refill   acetaminophen (TYLENOL) 500 MG tablet Take 2 tablets (1,000 mg total) by mouth every 6 (six) hours as needed for mild pain. 30 tablet 0   albuterol (PROVENTIL HFA;VENTOLIN HFA) 108 (90 Base) MCG/ACT inhaler Inhale 2 puffs into the lungs every 6 (six) hours as needed for wheezing or shortness of breath. 1 Inhaler 0   amLODipine (NORVASC) 5 MG tablet TAKE 1 TABLET BY MOUTH EVERY DAY 90 tablet 0   aspirin EC 81 MG tablet Take 81 mg by mouth daily. Swallow whole.     atorvastatin (LIPITOR) 20 MG tablet Take 20 mg by mouth. Monday wed and Friday in am     buPROPion (WELLBUTRIN SR) 150 MG 12 hr tablet Take 150 mg by mouth 2 (two) times daily.     cilostazol (PLETAL) 100 MG tablet Take 100 mg by mouth 2 (two) times daily.     EPINEPHrine (EPIPEN 2-PAK) 0.3 mg/0.3 mL DEVI Inject 0.3 mLs (0.3 mg total) into the muscle once as needed (for severe allergic reaction). CAll 911 immediately if you have to use this medicine 1 Device 1   LORazepam (ATIVAN) 0.5 MG tablet Take 0.5 mg by mouth daily as needed for anxiety.     losartan-hydrochlorothiazide (HYZAAR) 100-12.5  MG tablet Take 1 tablet by mouth daily.     metoprolol tartrate (LOPRESSOR) 25 MG tablet Take 1 tablet (25 mg total) by mouth 2 (two) times daily. PLEASE KEEP APPOINTMENT FOR FUTURE REFILLS 90 tablet 1   montelukast (SINGULAIR) 10 MG tablet Take 10 mg by mouth at bedtime.     mupirocin ointment (BACTROBAN) 2 % Apply 1 Application topically 2 (two) times daily. 22 g 0   nicotine (NICODERM CQ - DOSED IN MG/24 HOURS) 21 mg/24hr patch Place 21 mg onto the skin daily.     nicotine polacrilex (NICORETTE) 4 MG gum Take 4 mg by mouth as needed for smoking cessation.     nitroGLYCERIN (NITROSTAT) 0.4 MG SL tablet Place 1 tablet (0.4 mg total) under the tongue every 5 (five) minutes x 3 doses as needed for chest  pain. 25 tablet 2   oxybutynin (DITROPAN) 5 MG tablet Take 1 tablet (5 mg total) by mouth every 8 (eight) hours as needed for bladder spasms. 30 tablet 1   phenazopyridine (PYRIDIUM) 200 MG tablet Take 1 tablet by mouth 3 (three) times daily as needed (for pain with urination). 30 tablet 0   polyethylene glycol (MIRALAX / GLYCOLAX) 17 g packet Take 17 g by mouth as needed.     traMADol (ULTRAM) 50 MG tablet Take by mouth every 6 (six) hours as needed. Not taking     No current facility-administered medications for this visit.    SURGICAL HISTORY:  Past Surgical History:  Procedure Laterality Date   ABDOMINAL SURGERY     resection of small intestine   ANTERIOR CRUCIATE LIGAMENT REPAIR Bilateral    BACK SURGERY     fusion of lumbar   CARDIAC CATHETERIZATION  06/25/2012   COLON SURGERY     resection for bowel - for obstruction  6 to 7 yrs ago per pt on 01-05-2022   COLONOSCOPY     Hx; of   DILATION AND CURETTAGE OF UTERUS     EYE SURGERY Bilateral    cataracts removed   HERNIA REPAIR  02/23/1969   inguinal hernia    IR IMAGING GUIDED PORT INSERTION  06/07/2022   LEFT HEART CATHETERIZATION WITH CORONARY ANGIOGRAM N/A 03/30/2013   Procedure: LEFT HEART CATHETERIZATION WITH CORONARY ANGIOGRAM;  Surgeon: Leonie Man, MD;  Location: [facility] CATH LAB;  Service: Cardiovascular;  Laterality: N/A;   left thumb surgery     yrs ago   ROBOT ASSITED LAPAROSCOPIC NEPHROURETERECTOMY Left 03/09/2021   Procedure: XI ROBOT ASSITED LAPAROSCOPIC NEPHROURETERECTOMY/ CYSTOSCOPY WITH LEFT URETEROSCOPY WITH TRANSURETHRAL RESECTION OF URETERAL ORIFICE;  Surgeon: Ceasar Mons, MD;  Location: [facility];  Service: Urology;  Laterality: Left;   SALIVARY GLAND SURGERY Left    approached from inside mouth & side of neck 1970-1980   TONSILLECTOMY     as child   TOTAL HIP ARTHROPLASTY Left 05/28/2014   Procedure: LEFT TOTAL HIP ARTHROPLASTY;  Surgeon: Yvette Rack., MD;  Location: [facility];  Service:  Orthopedics;  Laterality: Left;   TRANSURETHRAL RESECTION OF BLADDER TUMOR N/A 08/23/2021   Procedure: TRANSURETHRAL RESECTION OF BLADDER TUMOR (TURBT) WITH CYSTOSCOPY/ POSTOPERATIVE INSTILLATION OF GEMCITABINE/retrograde pyelogram and stent placement (right);  Surgeon: Ceasar Mons, MD;  Location: [facility];  Service: Urology;  Laterality: N/A;   TRANSURETHRAL RESECTION OF BLADDER TUMOR N/A 01/10/2022   Procedure: TRANSURETHRAL RESECTION OF BLADDER TUMOR (TURBT) WITH CYSTOSCOPY / GEMCITABINE INSTILLATION post-operatively;  Surgeon: Ceasar Mons, MD;  Location: [facility];  Service: Urology;  Laterality: N/A;  ONLY NEEDS 30 MIN    REVIEW OF SYSTEMS:  A comprehensive review of systems was negative except for: Constitutional: positive for fatigue   PHYSICAL EXAMINATION: General appearance: alert, cooperative, fatigued, and no distress Head: Normocephalic, without obvious abnormality, atraumatic Neck: no adenopathy, no JVD, supple, symmetrical, trachea midline, and thyroid not enlarged, symmetric, no tenderness/mass/nodules Lymph nodes: Cervical, supraclavicular, and axillary nodes normal. Resp: clear to auscultation bilaterally Back: symmetric, no curvature. ROM normal. No CVA tenderness. Cardio: regular rate and rhythm, S1, S2 normal, no murmur, click, rub or gallop GI: soft, non-tender; bowel sounds normal; no masses,  no organomegaly Extremities: extremities normal, atraumatic, no cyanosis or edema  ECOG PERFORMANCE STATUS: 1 - Symptomatic but completely ambulatory  Blood pressure (!) 108/53, pulse 64, temperature (!) 97.3 F (36.3 C), temperature source Tympanic, resp. rate 17, weight 187 lb 9 oz (85.1 kg), SpO2 95 %.  LABORATORY DATA: Lab Results  Component Value Date   WBC 6.4 06/21/2022   HGB 12.9 06/21/2022   HCT 38.2 06/21/2022   MCV 94.6 06/21/2022   PLT 267 06/21/2022      Chemistry      Component Value Date/Time    NA 136 06/21/2022 1125   K 4.3 06/21/2022 1125   CL 105 06/21/2022 1125   CO2 26 06/21/2022 1125   BUN 24 (H) 06/21/2022 1125   CREATININE 1.24 (H) 06/21/2022 1125      Component Value Date/Time   CALCIUM 9.2 06/21/2022 1125   ALKPHOS 97 06/21/2022 1125   AST 13 (L) 06/21/2022 1125   ALT 10 06/21/2022 1125   BILITOT 0.4 06/21/2022 1125       RADIOGRAPHIC STUDIES: IR IMAGING GUIDED PORT INSERTION  Result Date: 06/07/2022 INDICATION: 71 year old female in need of durable venous access for chemotherapy. History of urothelial carcinoma metastatic to the lungs. EXAM: IMPLANTED PORT A CATH PLACEMENT WITH ULTRASOUND AND FLUOROSCOPIC GUIDANCE MEDICATIONS: None. ANESTHESIA/SEDATION: Versed 4 mg IV; Fentanyl 200 mcg IV; Moderate Sedation Time:  16 minutes The patient's vital signs and level of consciousness were continuously monitored during the procedure by the interventional radiology nurse under my direct supervision. FLUOROSCOPY: Pretty a shin exposure index: 2 mGy reference air kerma COMPLICATIONS: None immediate. PROCEDURE: The right neck and chest was prepped with chlorhexidine, and draped in the usual sterile fashion using maximum barrier technique (cap and mask, sterile gown, sterile gloves, large sterile sheet, hand hygiene and cutaneous antiseptic). Local anesthesia was attained by infiltration with 1% lidocaine with epinephrine. Ultrasound demonstrated patency of the right internal jugular vein, and this was documented with an image. Under real-time ultrasound guidance, this vein was accessed with a 21 gauge micropuncture needle and image documentation was performed. A small dermatotomy was made at the access site with an 11 scalpel. A 0.018" wire was advanced into the SVC and the access needle exchanged for a 60F micropuncture vascular sheath. The 0.018" wire was then removed and a 0.035" wire advanced into the IVC. An appropriate location for the subcutaneous reservoir was selected below  the clavicle and an incision was made through the skin and underlying soft tissues. The subcutaneous tissues were then dissected using a combination of blunt and sharp surgical technique and a pocket was formed. A single lumen power injectable portacatheter was then tunneled through the subcutaneous tissues from the pocket to the dermatotomy and the port reservoir placed within the subcutaneous pocket. The venous access site was then serially dilated and a peel away  vascular sheath placed over the wire. The wire was removed and the port catheter advanced into position under fluoroscopic guidance. The catheter tip is positioned in the superior cavoatrial junction. This was documented with a spot image. The portacatheter was then tested and found to flush and aspirate well. The port was flushed with saline followed by 100 units/mL heparinized saline. The pocket was then closed in two layers using first subdermal inverted interrupted absorbable sutures followed by a running subcuticular suture. The epidermis was then sealed with Dermabond. The dermatotomy at the venous access site was also closed with Dermabond. IMPRESSION: Successful placement of a right IJ approach Power Port with ultrasound and fluoroscopic guidance. The catheter is ready for use. Electronically Signed   By: Jacqulynn Cadet M.D.   On: 06/07/2022 15:48    ASSESSMENT AND PLAN: This is a very pleasant 71 years old African-American female with Metastatic urothelial carcinoma that was initially diagnosed as stage II (T2, N0, M0) in September 2022 status post right nephroureterectomy as well as intravesical treatment and resection of superficial tumor in the bladder several times and March 2023 as well as July 2023.  The patient was found to have enlarging and new pulmonary nodules consistent with metastatic disease in November 2023.  I will send her tissue biopsy for molecular studies by foundation 1 as well as PD-L1 expression. The patient is  currently undergoing systemic chemotherapy with carboplatin for AUC of 5 on day 1 and gemcitabine 1000 mg/M2 on days 1 and 8 every 3 weeks.  First dose June 21, 2022. I recommended for the patient to proceed with her treatment today as planned. I will see her back for follow-up visit in 3 weeks for evaluation before the start of cycle #2. I will call her pharmacy with prescription for Compazine as well as Emla cream. She was advised to call immediately if she has any other concerning symptoms in the interval. The patient voices understanding of current disease status and treatment options and is in agreement with the current care plan.  All questions were answered. The patient knows to call the clinic with any problems, questions or concerns. We can certainly see the patient much sooner if necessary.  The total time spent in the appointment was 20 minutes.  Disclaimer: This note was dictated with voice recognition software. Similar sounding words can inadvertently be transcribed and may not be corrected upon review.

## 2022-06-21 NOTE — Patient Instructions (Signed)
[facility] ONCOLOGY  Discharge Instructions: Thank you for choosing [facility] to provide your oncology and hematology care.   If you have a lab appointment with the [facility], please go directly to the [facility] and check in at the registration area.   Wear comfortable clothing and clothing appropriate for easy access to any Portacath or PICC line.   We strive to give you quality time with your provider. You may need to reschedule your appointment if you arrive late (15 or more minutes).  Arriving late affects you and other patients whose appointments are after yours.  Also, if you miss three or more appointments without notifying the office, you may be dismissed from the clinic at the provider's discretion.      For prescription refill requests, have your pharmacy contact our office and allow 72 hours for refills to be completed.    Today you received the following chemotherapy and/or immunotherapy agents; Carboplatin & Gemcitabine      To help prevent nausea and vomiting after your treatment, we encourage you to take your nausea medication as directed.  BELOW ARE SYMPTOMS THAT SHOULD BE REPORTED IMMEDIATELY: *FEVER GREATER THAN 100.4 F (38 C) OR HIGHER *CHILLS OR SWEATING *NAUSEA AND VOMITING THAT IS NOT CONTROLLED WITH YOUR NAUSEA MEDICATION *UNUSUAL SHORTNESS OF BREATH *UNUSUAL BRUISING OR BLEEDING *URINARY PROBLEMS (pain or burning when urinating, or frequent urination) *BOWEL PROBLEMS (unusual diarrhea, constipation, pain near the anus) TENDERNESS IN MOUTH AND THROAT WITH OR WITHOUT PRESENCE OF ULCERS (sore throat, sores in mouth, or a toothache) UNUSUAL RASH, SWELLING OR PAIN  UNUSUAL VAGINAL DISCHARGE OR ITCHING   Items with * indicate a potential emergency and should be followed up as soon as possible or go to the Emergency Department if any problems should occur.  Please show the CHEMOTHERAPY ALERT CARD or IMMUNOTHERAPY ALERT CARD  at check-in to the Emergency Department and triage nurse.  Should you have questions after your visit or need to cancel or reschedule your appointment, please contact [facility]  Dept: [phone]  and follow the prompts.  Office hours are 8:00 a.m. to 4:30 p.m. Monday - Friday. Please note that voicemails left after 4:00 p.m. may not be returned until the following business day.  We are closed weekends and major holidays. You have access to a nurse at all times for urgent questions. Please call the main number to the clinic Dept: [phone] and follow the prompts.   For any non-urgent questions, you may also contact your provider using MyChart. We now offer e-Visits for anyone 62 and older to request care online for non-urgent symptoms. For details visit [email or web address].   Also download the MyChart app! Go to the app store, search "MyChart", open the app, select [facility], and log in with your MyChart username and password.

## 2022-06-22 ENCOUNTER — Other Ambulatory Visit: Payer: Self-pay

## 2022-06-22 NOTE — Progress Notes (Signed)
PDL! Testing has been requested for this patient today through the [facility] Pathology department.  Per there request the information and diagnosis code has been faxed to [phone].  No further concerns at this time.

## 2022-06-23 DIAGNOSIS — I10 Essential (primary) hypertension: Secondary | ICD-10-CM | POA: Diagnosis not present

## 2022-06-23 DIAGNOSIS — J449 Chronic obstructive pulmonary disease, unspecified: Secondary | ICD-10-CM | POA: Diagnosis not present

## 2022-06-23 DIAGNOSIS — J45998 Other asthma: Secondary | ICD-10-CM | POA: Diagnosis not present

## 2022-06-28 ENCOUNTER — Inpatient Hospital Stay: Payer: Medicare Other | Attending: Internal Medicine | Admitting: Internal Medicine

## 2022-06-28 ENCOUNTER — Inpatient Hospital Stay: Payer: Medicare Other

## 2022-06-28 ENCOUNTER — Other Ambulatory Visit: Payer: Self-pay

## 2022-06-28 VITALS — BP 128/60 | HR 67 | Temp 97.7°F | Resp 16

## 2022-06-28 DIAGNOSIS — Z5111 Encounter for antineoplastic chemotherapy: Secondary | ICD-10-CM | POA: Insufficient documentation

## 2022-06-28 DIAGNOSIS — R252 Cramp and spasm: Secondary | ICD-10-CM | POA: Diagnosis not present

## 2022-06-28 DIAGNOSIS — C7910 Secondary malignant neoplasm of unspecified urinary organs: Secondary | ICD-10-CM | POA: Diagnosis not present

## 2022-06-28 DIAGNOSIS — C7800 Secondary malignant neoplasm of unspecified lung: Secondary | ICD-10-CM | POA: Insufficient documentation

## 2022-06-28 DIAGNOSIS — I2510 Atherosclerotic heart disease of native coronary artery without angina pectoris: Secondary | ICD-10-CM | POA: Diagnosis not present

## 2022-06-28 DIAGNOSIS — K5900 Constipation, unspecified: Secondary | ICD-10-CM | POA: Insufficient documentation

## 2022-06-28 DIAGNOSIS — G8929 Other chronic pain: Secondary | ICD-10-CM | POA: Insufficient documentation

## 2022-06-28 DIAGNOSIS — C679 Malignant neoplasm of bladder, unspecified: Secondary | ICD-10-CM | POA: Diagnosis not present

## 2022-06-28 DIAGNOSIS — R110 Nausea: Secondary | ICD-10-CM | POA: Diagnosis not present

## 2022-06-28 DIAGNOSIS — G629 Polyneuropathy, unspecified: Secondary | ICD-10-CM | POA: Diagnosis not present

## 2022-06-28 DIAGNOSIS — Z5189 Encounter for other specified aftercare: Secondary | ICD-10-CM | POA: Diagnosis not present

## 2022-06-28 DIAGNOSIS — Z87891 Personal history of nicotine dependence: Secondary | ICD-10-CM | POA: Diagnosis not present

## 2022-06-28 DIAGNOSIS — I252 Old myocardial infarction: Secondary | ICD-10-CM | POA: Diagnosis not present

## 2022-06-28 DIAGNOSIS — Z885 Allergy status to narcotic agent status: Secondary | ICD-10-CM | POA: Diagnosis not present

## 2022-06-28 DIAGNOSIS — Z7902 Long term (current) use of antithrombotics/antiplatelets: Secondary | ICD-10-CM | POA: Diagnosis not present

## 2022-06-28 DIAGNOSIS — I129 Hypertensive chronic kidney disease with stage 1 through stage 4 chronic kidney disease, or unspecified chronic kidney disease: Secondary | ICD-10-CM | POA: Insufficient documentation

## 2022-06-28 DIAGNOSIS — Z79899 Other long term (current) drug therapy: Secondary | ICD-10-CM | POA: Insufficient documentation

## 2022-06-28 DIAGNOSIS — Z881 Allergy status to other antibiotic agents status: Secondary | ICD-10-CM | POA: Diagnosis not present

## 2022-06-28 DIAGNOSIS — Z95828 Presence of other vascular implants and grafts: Secondary | ICD-10-CM

## 2022-06-28 LAB — CMP (CANCER CENTER ONLY)
ALT: 13 U/L (ref 0–44)
AST: 16 U/L (ref 15–41)
Albumin: 3.7 g/dL (ref 3.5–5.0)
Alkaline Phosphatase: 105 U/L (ref 38–126)
Anion gap: 6 (ref 5–15)
BUN: 28 mg/dL — ABNORMAL HIGH (ref 8–23)
CO2: 27 mmol/L (ref 22–32)
Calcium: 9 mg/dL (ref 8.9–10.3)
Chloride: 105 mmol/L (ref 98–111)
Creatinine: 1.37 mg/dL — ABNORMAL HIGH (ref 0.44–1.00)
GFR, Estimated: 41 mL/min — ABNORMAL LOW (ref >60)
Glucose, Bld: 100 mg/dL — ABNORMAL HIGH (ref 70–99)
Potassium: 4.1 mmol/L (ref 3.5–5.1)
Sodium: 138 mmol/L (ref 135–145)
Total Bilirubin: 0.2 mg/dL — ABNORMAL LOW (ref 0.3–1.2)
Total Protein: 6 g/dL — ABNORMAL LOW (ref 6.5–8.1)

## 2022-06-28 LAB — CBC WITH DIFFERENTIAL (CANCER CENTER ONLY)
Abs Immature Granulocytes: 0 10*3/uL (ref 0.00–0.07)
Basophils Absolute: 0 10*3/uL (ref 0.0–0.1)
Basophils Relative: 1 %
Eosinophils Absolute: 0.1 10*3/uL (ref 0.0–0.5)
Eosinophils Relative: 2 %
HCT: 37 % (ref 36.0–46.0)
Hemoglobin: 12.6 g/dL (ref 12.0–15.0)
Immature Granulocytes: 0 %
Lymphocytes Relative: 74 %
Lymphs Abs: 1.7 10*3/uL (ref 0.7–4.0)
MCH: 32.1 pg (ref 26.0–34.0)
MCHC: 34.1 g/dL (ref 30.0–36.0)
MCV: 94.1 fL (ref 80.0–100.0)
Monocytes Absolute: 0.1 10*3/uL (ref 0.1–1.0)
Monocytes Relative: 2 %
Neutro Abs: 0.5 10*3/uL — ABNORMAL LOW (ref 1.7–7.7)
Neutrophils Relative %: 21 %
Platelet Count: 169 10*3/uL (ref 150–400)
RBC: 3.93 MIL/uL (ref 3.87–5.11)
RDW: 13.5 % (ref 11.5–15.5)
Smear Review: NORMAL
WBC Count: 2.3 10*3/uL — ABNORMAL LOW (ref 4.0–10.5)
nRBC: 0 % (ref 0.0–0.2)

## 2022-06-28 MED ORDER — HEPARIN SOD (PORK) LOCK FLUSH 100 UNIT/ML IV SOLN
500.0000 [IU] | Freq: Once | INTRAVENOUS | Status: AC | PRN
  Administered 2022-06-28: 500 [IU]

## 2022-06-28 MED ORDER — SODIUM CHLORIDE 0.9 % IV SOLN
1000.0000 mg/m2 | Freq: Once | INTRAVENOUS | Status: AC
  Administered 2022-06-28: 2015 mg via INTRAVENOUS
  Filled 2022-06-28: qty 52.99

## 2022-06-28 MED ORDER — SODIUM CHLORIDE 0.9% FLUSH
10.0000 mL | INTRAVENOUS | Status: DC | PRN
  Administered 2022-06-28: 10 mL

## 2022-06-28 MED ORDER — PROCHLORPERAZINE MALEATE 10 MG PO TABS
10.0000 mg | ORAL_TABLET | Freq: Once | ORAL | Status: AC
  Administered 2022-06-28: 10 mg via ORAL
  Filled 2022-06-28: qty 1

## 2022-06-28 MED ORDER — SODIUM CHLORIDE 0.9 % IV SOLN: Freq: Once | INTRAVENOUS | Status: AC

## 2022-06-28 MED ORDER — SODIUM CHLORIDE 0.9% FLUSH
10.0000 mL | INTRAVENOUS | Status: AC | PRN
  Administered 2022-06-28: 10 mL

## 2022-06-28 NOTE — Patient Instructions (Signed)
[facility] ONCOLOGY  Discharge Instructions: Thank you for choosing [facility] to provide your oncology and hematology care.   If you have a lab appointment with the [facility], please go directly to the [facility] and check in at the registration area.   Wear comfortable clothing and clothing appropriate for easy access to any Portacath or PICC line.   We strive to give you quality time with your provider. You may need to reschedule your appointment if you arrive late (15 or more minutes).  Arriving late affects you and other patients whose appointments are after yours.  Also, if you miss three or more appointments without notifying the office, you may be dismissed from the clinic at the provider's discretion.      For prescription refill requests, have your pharmacy contact our office and allow 72 hours for refills to be completed.    Today you received the following chemotherapy and/or immunotherapy agents;  Gemcitabine      To help prevent nausea and vomiting after your treatment, we encourage you to take your nausea medication as directed.  BELOW ARE SYMPTOMS THAT SHOULD BE REPORTED IMMEDIATELY: *FEVER GREATER THAN 100.4 F (38 C) OR HIGHER *CHILLS OR SWEATING *NAUSEA AND VOMITING THAT IS NOT CONTROLLED WITH YOUR NAUSEA MEDICATION *UNUSUAL SHORTNESS OF BREATH *UNUSUAL BRUISING OR BLEEDING *URINARY PROBLEMS (pain or burning when urinating, or frequent urination) *BOWEL PROBLEMS (unusual diarrhea, constipation, pain near the anus) TENDERNESS IN MOUTH AND THROAT WITH OR WITHOUT PRESENCE OF ULCERS (sore throat, sores in mouth, or a toothache) UNUSUAL RASH, SWELLING OR PAIN  UNUSUAL VAGINAL DISCHARGE OR ITCHING   Items with * indicate a potential emergency and should be followed up as soon as possible or go to the Emergency Department if any problems should occur.  Please show the CHEMOTHERAPY ALERT CARD or IMMUNOTHERAPY ALERT CARD at check-in  to the Emergency Department and triage nurse.  Should you have questions after your visit or need to cancel or reschedule your appointment, please contact [facility]  Dept: [phone]  and follow the prompts.  Office hours are 8:00 a.m. to 4:30 p.m. Monday - Friday. Please note that voicemails left after 4:00 p.m. may not be returned until the following business day.  We are closed weekends and major holidays. You have access to a nurse at all times for urgent questions. Please call the main number to the clinic Dept: [phone] and follow the prompts.   For any non-urgent questions, you may also contact your provider using MyChart. We now offer e-Visits for anyone 85 and older to request care online for non-urgent symptoms. For details visit [email or web address].   Also download the MyChart app! Go to the app store, search "MyChart", open the app, select [facility], and log in with your MyChart username and password.

## 2022-06-28 NOTE — Progress Notes (Signed)
[facility] [phone]   [phone]  OFFICE PROGRESS NOTE  [name], MD [address] [place] [address]  DIAGNOSIS: Metastatic urothelial carcinoma that was initially diagnosed as stage II (T2, N0, M0) in September 2022 status post right nephroureterectomy as well as intravesical treatment and resection of superficial tumor in the bladder several times and March 2023 as well as July 2023.  The patient was found to have enlarging and new pulmonary nodules consistent with metastatic disease in November 2023.   PRIOR THERAPY: None  CURRENT THERAPY: Palliative systemic chemotherapy with carboplatin for AUC of 5 on day 1 and gemcitabine 1000 mg/M2 on days 1 and 8 every 3 weeks.  First cycle June 21, 2022.  INTERVAL HISTORY: Morgan Torres 72 y.o. female returns to the clinic today for follow-up visit.  The patient is feeling fine today with no concerning complaints except for some cramps in her legs.  She tolerated the first week of her treatment fairly well with no concerning adverse effects except for 1 episode of nausea and she did not have to take her nausea medication because it resolves on its own.  She denied having any fever or chills.  She has no current nausea, vomiting, diarrhea or constipation.  She has no headache or visual changes.  She denied having any recent weight loss or night sweats.  She has no chest pain, shortness of breath, cough or hemoptysis.  She is here today for day 8 of cycle #1.  MEDICAL HISTORY: Past Medical History:  Diagnosis Date   Anxiety    Arthritis    hip, lumbar spine    Asthma    seasonal allergies    Bronchitis    Hx: of   Chronic kidney disease    COPD (chronic obstructive pulmonary disease) (HCC)    Coronary artery disease    Depression    Fibromyalgia    Hypertension    Lumbar herniated disc    Myocardial infarction ([place]) 06/25/2012   followed by Dr. Debara Pickett, treated medically, no  stents   Peripheral arterial disease ([place])    of right foot   Pneumonia    Hx: of yrs ago   Sciatica    Sleep apnea    Small bowel obstruction ([place])    several yrs ago    ALLERGIES:  is allergic to diclofenac, codeine, hydrocodone, and ciprofloxacin.  MEDICATIONS:  Current Outpatient Medications  Medication Sig Dispense Refill   acetaminophen (TYLENOL) 500 MG tablet Take 2 tablets (1,000 mg total) by mouth every 6 (six) hours as needed for mild pain. 30 tablet 0   albuterol (PROVENTIL HFA;VENTOLIN HFA) 108 (90 Base) MCG/ACT inhaler Inhale 2 puffs into the lungs every 6 (six) hours as needed for wheezing or shortness of breath. 1 Inhaler 0   amLODipine (NORVASC) 5 MG tablet TAKE 1 TABLET BY MOUTH EVERY DAY 90 tablet 0   aspirin EC 81 MG tablet Take 81 mg by mouth daily. Swallow whole.     atorvastatin (LIPITOR) 20 MG tablet Take 20 mg by mouth. Monday wed and Friday in am     buPROPion (WELLBUTRIN SR) 150 MG 12 hr tablet Take 150 mg by mouth 2 (two) times daily.     cilostazol (PLETAL) 100 MG tablet Take 100 mg by mouth 2 (two) times daily.     EPINEPHrine (EPIPEN 2-PAK) 0.3 mg/0.3 mL DEVI Inject 0.3 mLs (0.3 mg total) into the muscle once  as needed (for severe allergic reaction). CAll 911 immediately if you have to use this medicine 1 Device 1   lidocaine-prilocaine (EMLA) cream Apply to the Port-A-Cath site 30-60-minute before treatment 30 g 0   LORazepam (ATIVAN) 0.5 MG tablet Take 0.5 mg by mouth daily as needed for anxiety.     losartan-hydrochlorothiazide (HYZAAR) 100-12.5 MG tablet Take 1 tablet by mouth daily.     metoprolol tartrate (LOPRESSOR) 25 MG tablet Take 1 tablet (25 mg total) by mouth 2 (two) times daily. PLEASE KEEP APPOINTMENT FOR FUTURE REFILLS 90 tablet 1   montelukast (SINGULAIR) 10 MG tablet Take 10 mg by mouth at bedtime.     mupirocin ointment (BACTROBAN) 2 % Apply 1 Application topically 2 (two) times daily. 22 g 0   nicotine (NICODERM CQ - DOSED IN MG/24  HOURS) 21 mg/24hr patch Place 21 mg onto the skin daily.     nicotine polacrilex (NICORETTE) 4 MG gum Take 4 mg by mouth as needed for smoking cessation.     nitroGLYCERIN (NITROSTAT) 0.4 MG SL tablet Place 1 tablet (0.4 mg total) under the tongue every 5 (five) minutes x 3 doses as needed for chest pain. 25 tablet 2   oxybutynin (DITROPAN) 5 MG tablet Take 1 tablet (5 mg total) by mouth every 8 (eight) hours as needed for bladder spasms. 30 tablet 1   phenazopyridine (PYRIDIUM) 200 MG tablet Take 1 tablet by mouth 3 (three) times daily as needed (for pain with urination). 30 tablet 0   polyethylene glycol (MIRALAX / GLYCOLAX) 17 g packet Take 17 g by mouth as needed.     prochlorperazine (COMPAZINE) 10 MG tablet Take 1 tablet (10 mg total) by mouth every 6 (six) hours as needed for nausea or vomiting. 30 tablet 0   traMADol (ULTRAM) 50 MG tablet Take by mouth every 6 (six) hours as needed. Not taking     No current facility-administered medications for this visit.    SURGICAL HISTORY:  Past Surgical History:  Procedure Laterality Date   ABDOMINAL SURGERY     resection of small intestine   ANTERIOR CRUCIATE LIGAMENT REPAIR Bilateral    BACK SURGERY     fusion of lumbar   CARDIAC CATHETERIZATION  06/25/2012   COLON SURGERY     resection for bowel - for obstruction  6 to 7 yrs ago per pt on 01-05-2022   COLONOSCOPY     Hx; of   DILATION AND CURETTAGE OF UTERUS     EYE SURGERY Bilateral    cataracts removed   HERNIA REPAIR  02/23/1969   inguinal hernia    IR IMAGING GUIDED PORT INSERTION  06/07/2022   LEFT HEART CATHETERIZATION WITH CORONARY ANGIOGRAM N/A 03/30/2013   Procedure: LEFT HEART CATHETERIZATION WITH CORONARY ANGIOGRAM;  Surgeon: Leonie Man, MD;  Location: [facility] CATH LAB;  Service: Cardiovascular;  Laterality: N/A;   left thumb surgery     yrs ago   ROBOT ASSITED LAPAROSCOPIC NEPHROURETERECTOMY Left 03/09/2021   Procedure: XI ROBOT ASSITED LAPAROSCOPIC NEPHROURETERECTOMY/  CYSTOSCOPY WITH LEFT URETEROSCOPY WITH TRANSURETHRAL RESECTION OF URETERAL ORIFICE;  Surgeon: Ceasar Mons, MD;  Location: [facility] ORS;  Service: Urology;  Laterality: Left;   SALIVARY GLAND SURGERY Left    approached from inside mouth & side of neck 1970-1980   TONSILLECTOMY     as child   TOTAL HIP ARTHROPLASTY Left 05/28/2014   Procedure: LEFT TOTAL HIP ARTHROPLASTY;  Surgeon: Yvette Rack., MD;  Location: [facility];  Service: Orthopedics;  Laterality: Left;   TRANSURETHRAL RESECTION OF BLADDER TUMOR N/A 08/23/2021   Procedure: TRANSURETHRAL RESECTION OF BLADDER TUMOR (TURBT) WITH CYSTOSCOPY/ POSTOPERATIVE INSTILLATION OF GEMCITABINE/retrograde pyelogram and stent placement (right);  Surgeon: Ceasar Mons, MD;  Location: [facility];  Service: Urology;  Laterality: N/A;   TRANSURETHRAL RESECTION OF BLADDER TUMOR N/A 01/10/2022   Procedure: TRANSURETHRAL RESECTION OF BLADDER TUMOR (TURBT) WITH CYSTOSCOPY / GEMCITABINE INSTILLATION post-operatively;  Surgeon: Ceasar Mons, MD;  Location: [facility];  Service: Urology;  Laterality: N/A;  ONLY NEEDS 30 MIN    REVIEW OF SYSTEMS:  A comprehensive review of systems was negative except for: Constitutional: positive for fatigue   PHYSICAL EXAMINATION: General appearance: alert, cooperative, fatigued, and no distress Head: Normocephalic, without obvious abnormality, atraumatic Neck: no adenopathy, no JVD, supple, symmetrical, trachea midline, and thyroid not enlarged, symmetric, no tenderness/mass/nodules Lymph nodes: Cervical, supraclavicular, and axillary nodes normal. Resp: clear to auscultation bilaterally Back: symmetric, no curvature. ROM normal. No CVA tenderness. Cardio: regular rate and rhythm, S1, S2 normal, no murmur, click, rub or gallop GI: soft, non-tender; bowel sounds normal; no masses,  no organomegaly Extremities: extremities normal, atraumatic, no cyanosis or  edema  ECOG PERFORMANCE STATUS: 1 - Symptomatic but completely ambulatory  Blood pressure 137/65, pulse 81, temperature (!) 97.5 F (36.4 C), temperature source Temporal, resp. rate 16, weight 187 lb 4.8 oz (85 kg), SpO2 95 %.  LABORATORY DATA: Lab Results  Component Value Date   WBC 6.4 06/21/2022   HGB 12.9 06/21/2022   HCT 38.2 06/21/2022   MCV 94.6 06/21/2022   PLT 267 06/21/2022      Chemistry      Component Value Date/Time   NA 136 06/21/2022 1125   K 4.3 06/21/2022 1125   CL 105 06/21/2022 1125   CO2 26 06/21/2022 1125   BUN 24 (H) 06/21/2022 1125   CREATININE 1.24 (H) 06/21/2022 1125      Component Value Date/Time   CALCIUM 9.2 06/21/2022 1125   ALKPHOS 97 06/21/2022 1125   AST 13 (L) 06/21/2022 1125   ALT 10 06/21/2022 1125   BILITOT 0.4 06/21/2022 1125       RADIOGRAPHIC STUDIES: IR IMAGING GUIDED PORT INSERTION  Result Date: 06/07/2022 INDICATION: 72 year old female in need of durable venous access for chemotherapy. History of urothelial carcinoma metastatic to the lungs. EXAM: IMPLANTED PORT A CATH PLACEMENT WITH ULTRASOUND AND FLUOROSCOPIC GUIDANCE MEDICATIONS: None. ANESTHESIA/SEDATION: Versed 4 mg IV; Fentanyl 200 mcg IV; Moderate Sedation Time:  16 minutes The patient's vital signs and level of consciousness were continuously monitored during the procedure by the interventional radiology nurse under my direct supervision. FLUOROSCOPY: Pretty a shin exposure index: 2 mGy reference air kerma COMPLICATIONS: None immediate. PROCEDURE: The right neck and chest was prepped with chlorhexidine, and draped in the usual sterile fashion using maximum barrier technique (cap and mask, sterile gown, sterile gloves, large sterile sheet, hand hygiene and cutaneous antiseptic). Local anesthesia was attained by infiltration with 1% lidocaine with epinephrine. Ultrasound demonstrated patency of the right internal jugular vein, and this was documented with an image. Under  real-time ultrasound guidance, this vein was accessed with a 21 gauge micropuncture needle and image documentation was performed. A small dermatotomy was made at the access site with an 11 scalpel. A 0.018" wire was advanced into the SVC and the access needle exchanged for a 52F micropuncture vascular sheath. The 0.018" wire was then removed and a 0.035" wire advanced into  the IVC. An appropriate location for the subcutaneous reservoir was selected below the clavicle and an incision was made through the skin and underlying soft tissues. The subcutaneous tissues were then dissected using a combination of blunt and sharp surgical technique and a pocket was formed. A single lumen power injectable portacatheter was then tunneled through the subcutaneous tissues from the pocket to the dermatotomy and the port reservoir placed within the subcutaneous pocket. The venous access site was then serially dilated and a peel away vascular sheath placed over the wire. The wire was removed and the port catheter advanced into position under fluoroscopic guidance. The catheter tip is positioned in the superior cavoatrial junction. This was documented with a spot image. The portacatheter was then tested and found to flush and aspirate well. The port was flushed with saline followed by 100 units/mL heparinized saline. The pocket was then closed in two layers using first subdermal inverted interrupted absorbable sutures followed by a running subcuticular suture. The epidermis was then sealed with Dermabond. The dermatotomy at the venous access site was also closed with Dermabond. IMPRESSION: Successful placement of a right IJ approach Power Port with ultrasound and fluoroscopic guidance. The catheter is ready for use. Electronically Signed   By: Jacqulynn Cadet M.D.   On: 06/07/2022 15:48    ASSESSMENT AND PLAN: This is a very pleasant 72 years old African-American female with Metastatic urothelial carcinoma that was initially  diagnosed as stage II (T2, N0, M0) in September 2022 status post right nephroureterectomy as well as intravesical treatment and resection of superficial tumor in the bladder several times and March 2023 as well as July 2023.  The patient was found to have enlarging and new pulmonary nodules consistent with metastatic disease in November 2023.  I will send her tissue biopsy for molecular studies by foundation 1 as well as PD-L1 expression. The patient is currently undergoing systemic chemotherapy with carboplatin for AUC of 5 on day 1 and gemcitabine 1000 mg/M2 on days 1 and 8 every 3 weeks.  First dose June 21, 2022.  She is status post day 1 of cycle #1. The patient tolerated the first week of her treatment well with no concerning adverse effects. I recommended for her to proceed with day 8 of cycle #1 today as planned. She will come back for follow-up visit in 2 weeks for evaluation before starting cycle #2. For the smoking cessation, she is currently using NicoDerm patch to quit smoking. The patient was advised to call immediately if she has any other concerning symptoms in the interval. The patient voices understanding of current disease status and treatment options and is in agreement with the current care plan.  All questions were answered. The patient knows to call the clinic with any problems, questions or concerns. We can certainly see the patient much sooner if necessary.  The total time spent in the appointment was 20 minutes.  Disclaimer: This note was dictated with voice recognition software. Similar sounding words can inadvertently be transcribed and may not be corrected upon review.

## 2022-06-28 NOTE — Progress Notes (Signed)
Per Dr. Julien Nordmann ,it is ok to treat pt today with Gemcitabine and Carboplatin. Neulasta has been ordered and schedule message sent.

## 2022-06-29 ENCOUNTER — Inpatient Hospital Stay: Payer: Medicare Other

## 2022-06-29 ENCOUNTER — Other Ambulatory Visit (HOSPITAL_COMMUNITY): Payer: Self-pay

## 2022-06-29 VITALS — BP 132/65 | HR 66 | Temp 98.4°F | Resp 18

## 2022-06-29 DIAGNOSIS — I2510 Atherosclerotic heart disease of native coronary artery without angina pectoris: Secondary | ICD-10-CM | POA: Diagnosis not present

## 2022-06-29 DIAGNOSIS — Z87891 Personal history of nicotine dependence: Secondary | ICD-10-CM | POA: Diagnosis not present

## 2022-06-29 DIAGNOSIS — I129 Hypertensive chronic kidney disease with stage 1 through stage 4 chronic kidney disease, or unspecified chronic kidney disease: Secondary | ICD-10-CM | POA: Diagnosis not present

## 2022-06-29 DIAGNOSIS — G629 Polyneuropathy, unspecified: Secondary | ICD-10-CM | POA: Diagnosis not present

## 2022-06-29 DIAGNOSIS — C7910 Secondary malignant neoplasm of unspecified urinary organs: Secondary | ICD-10-CM

## 2022-06-29 DIAGNOSIS — Z79899 Other long term (current) drug therapy: Secondary | ICD-10-CM | POA: Diagnosis not present

## 2022-06-29 DIAGNOSIS — G8929 Other chronic pain: Secondary | ICD-10-CM | POA: Diagnosis not present

## 2022-06-29 DIAGNOSIS — K5900 Constipation, unspecified: Secondary | ICD-10-CM | POA: Diagnosis not present

## 2022-06-29 DIAGNOSIS — Z5189 Encounter for other specified aftercare: Secondary | ICD-10-CM | POA: Diagnosis not present

## 2022-06-29 DIAGNOSIS — R252 Cramp and spasm: Secondary | ICD-10-CM | POA: Diagnosis not present

## 2022-06-29 DIAGNOSIS — C7800 Secondary malignant neoplasm of unspecified lung: Secondary | ICD-10-CM | POA: Diagnosis not present

## 2022-06-29 DIAGNOSIS — C679 Malignant neoplasm of bladder, unspecified: Secondary | ICD-10-CM | POA: Diagnosis not present

## 2022-06-29 DIAGNOSIS — Z7902 Long term (current) use of antithrombotics/antiplatelets: Secondary | ICD-10-CM | POA: Diagnosis not present

## 2022-06-29 DIAGNOSIS — Z885 Allergy status to narcotic agent status: Secondary | ICD-10-CM | POA: Diagnosis not present

## 2022-06-29 DIAGNOSIS — Z881 Allergy status to other antibiotic agents status: Secondary | ICD-10-CM | POA: Diagnosis not present

## 2022-06-29 DIAGNOSIS — Z5111 Encounter for antineoplastic chemotherapy: Secondary | ICD-10-CM | POA: Diagnosis not present

## 2022-06-29 DIAGNOSIS — I252 Old myocardial infarction: Secondary | ICD-10-CM | POA: Diagnosis not present

## 2022-06-29 DIAGNOSIS — R110 Nausea: Secondary | ICD-10-CM | POA: Diagnosis not present

## 2022-06-29 MED ORDER — PEGFILGRASTIM INJECTION 6 MG/0.6ML ~~LOC~~
6.0000 mg | PREFILLED_SYRINGE | Freq: Once | SUBCUTANEOUS | Status: AC
  Administered 2022-06-29: 6 mg via SUBCUTANEOUS
  Filled 2022-06-29: qty 0.6

## 2022-06-29 NOTE — Patient Instructions (Signed)

## 2022-07-05 ENCOUNTER — Encounter (HOSPITAL_COMMUNITY): Payer: Self-pay | Admitting: Internal Medicine

## 2022-07-05 DIAGNOSIS — C679 Malignant neoplasm of bladder, unspecified: Secondary | ICD-10-CM | POA: Diagnosis not present

## 2022-07-06 ENCOUNTER — Other Ambulatory Visit: Payer: Self-pay | Admitting: Internal Medicine

## 2022-07-09 DIAGNOSIS — M533 Sacrococcygeal disorders, not elsewhere classified: Secondary | ICD-10-CM | POA: Diagnosis not present

## 2022-07-10 NOTE — Progress Notes (Signed)
[facility] OFFICE PROGRESS NOTE  Morgan Torres, [address] [place] [address]  DIAGNOSIS: Metastatic urothelial carcinoma that was initially diagnosed as stage II (T2, N0, M0) in September 2022 status post right nephroureterectomy as well as intravesical treatment and resection of superficial tumor in the bladder several times and March 2023 as well as July 2023.  The patient was found to have enlarging and new pulmonary nodules consistent with metastatic disease in November 2023.   PRIOR THERAPY: Right nephroureterectomy as well as intravesical treatment and resection of superficial tumor in the bladder several times and March 2023 as well as July 2023.    CURRENT THERAPY: Palliative systemic chemotherapy with carboplatin for AUC of 5 on day 1 and gemcitabine 1000 mg/M2 on days 1 and 8 every 3 weeks. First cycle June 21, 2022. Status post 1 cycle.   INTERVAL HISTORY: Morgan Torres 72 y.o. female returns to the clinic today for a follow up visit.  The patient was last seen by Dr. Julien Nordmann on 06/28/2022.  The patient is currently undergoing systemic chemotherapy on days 1 and 8 IV every 3 weeks.  She is tolerating treatment well without any adverse side effects. She mentions she fell in the interval and saw her orthopedic provider at [facility] for her tail bone. She is using a donut to sit on. She states she picked up what sounds like is OTC Lidoderm patches. She states they weren't sure if she was allowed to be prescribed pain medication with her chemo. She is wondering if this is ok. Today she denies any fever, chills, night sweats, or unexplained weight loss. She reports a fine appetite. She denies any chest pain, shortness of breath, cough, or hemoptysis.  Denies any nausea, vomiting, or diarrhea. She has occasional constipation for which she took miralax and smooth move tea. She denies any blood in the urine. She has chronic back pain and denies changes.  She denies  any abdominal pain.  She denies any dysuria or foul-smelling urine. She is here today for evaluation and repeat blood work before starting day 1 cycle 2.   MEDICAL HISTORY: Past Medical History:  Diagnosis Date   Anxiety    Arthritis    hip, lumbar spine    Asthma    seasonal allergies    Bronchitis    Hx: of   Chronic kidney disease    COPD (chronic obstructive pulmonary disease) (HCC)    Coronary artery disease    Depression    Fibromyalgia    Hypertension    Lumbar herniated disc    Myocardial infarction ([place]) 06/25/2012   followed by Dr. Debara Pickett, treated medically, no stents   Peripheral arterial disease ([place])    of right foot   Pneumonia    Hx: of yrs ago   Sciatica    Sleep apnea    Small bowel obstruction ([place])    several yrs ago    ALLERGIES:  is allergic to diclofenac, codeine, hydrocodone, and ciprofloxacin.  MEDICATIONS:  Current Outpatient Medications  Medication Sig Dispense Refill   acetaminophen (TYLENOL) 500 MG tablet Take 2 tablets (1,000 mg total) by mouth every 6 (six) hours as needed for mild pain. 30 tablet 0   albuterol (PROVENTIL HFA;VENTOLIN HFA) 108 (90 Base) MCG/ACT inhaler Inhale 2 puffs into the lungs every 6 (six) hours as needed for wheezing or shortness of breath. 1 Inhaler 0   amLODipine (NORVASC) 5 MG tablet TAKE 1 TABLET BY  MOUTH EVERY DAY 90 tablet 0   aspirin EC 81 MG tablet Take 81 mg by mouth daily. Swallow whole.     atorvastatin (LIPITOR) 20 MG tablet Take 20 mg by mouth. Monday wed and Friday in am     buPROPion (WELLBUTRIN SR) 150 MG 12 hr tablet Take 150 mg by mouth 2 (two) times daily.     cilostazol (PLETAL) 100 MG tablet Take 100 mg by mouth 2 (two) times daily.     EPINEPHrine (EPIPEN 2-PAK) 0.3 mg/0.3 mL DEVI Inject 0.3 mLs (0.3 mg total) into the muscle once as needed (for severe allergic reaction). CAll 911 immediately if you have to use this medicine 1 Device 1   lidocaine-prilocaine (EMLA) cream Apply to the Port-A-Cath  site 30-60-minute before treatment 30 g 0   LORazepam (ATIVAN) 0.5 MG tablet Take 0.5 mg by mouth daily as needed for anxiety.     losartan-hydrochlorothiazide (HYZAAR) 100-12.5 MG tablet Take 1 tablet by mouth daily.     metoprolol tartrate (LOPRESSOR) 25 MG tablet Take 1 tablet (25 mg total) by mouth 2 (two) times daily. Please schedule appointment for further refills 30 tablet 1   montelukast (SINGULAIR) 10 MG tablet Take 10 mg by mouth at bedtime.     mupirocin ointment (BACTROBAN) 2 % Apply 1 Application topically 2 (two) times daily. 22 g 0   nicotine (NICODERM CQ - DOSED IN MG/24 HOURS) 21 mg/24hr patch Place 21 mg onto the skin daily.     nicotine polacrilex (NICORETTE) 4 MG gum Take 4 mg by mouth as needed for smoking cessation.     nitroGLYCERIN (NITROSTAT) 0.4 MG SL tablet Place 1 tablet (0.4 mg total) under the tongue every 5 (five) minutes x 3 doses as needed for chest pain. 25 tablet 2   oxybutynin (DITROPAN) 5 MG tablet Take 1 tablet (5 mg total) by mouth every 8 (eight) hours as needed for bladder spasms. 30 tablet 1   phenazopyridine (PYRIDIUM) 200 MG tablet Take 1 tablet by mouth 3 (three) times daily as needed (for pain with urination). 30 tablet 0   polyethylene glycol (MIRALAX / GLYCOLAX) 17 g packet Take 17 g by mouth as needed.     prochlorperazine (COMPAZINE) 10 MG tablet Take 1 tablet (10 mg total) by mouth every 6 (six) hours as needed for nausea or vomiting. 30 tablet 0   traMADol (ULTRAM) 50 MG tablet Take by mouth every 6 (six) hours as needed. Not taking     No current facility-administered medications for this visit.    SURGICAL HISTORY:  Past Surgical History:  Procedure Laterality Date   ABDOMINAL SURGERY     resection of small intestine   ANTERIOR CRUCIATE LIGAMENT REPAIR Bilateral    BACK SURGERY     fusion of lumbar   CARDIAC CATHETERIZATION  06/25/2012   COLON SURGERY     resection for bowel - for obstruction  6 to 7 yrs ago per pt on 01-05-2022    COLONOSCOPY     Hx; of   DILATION AND CURETTAGE OF UTERUS     EYE SURGERY Bilateral    cataracts removed   HERNIA REPAIR  02/23/1969   inguinal hernia    IR IMAGING GUIDED PORT INSERTION  06/07/2022   LEFT HEART CATHETERIZATION WITH CORONARY ANGIOGRAM N/A 03/30/2013   Procedure: LEFT HEART CATHETERIZATION WITH CORONARY ANGIOGRAM;  Surgeon: Leonie Man, MD;  Location: [facility] CATH LAB;  Service: Cardiovascular;  Laterality: N/A;   left thumb surgery  yrs ago   [place] NEPHROURETERECTOMY Left 03/09/2021   Procedure: XI ROBOT ASSITED LAPAROSCOPIC NEPHROURETERECTOMY/ CYSTOSCOPY WITH LEFT URETEROSCOPY WITH TRANSURETHRAL RESECTION OF URETERAL ORIFICE;  Surgeon: Ceasar Mons, MD;  Location: [facility];  Service: Urology;  Laterality: Left;   SALIVARY GLAND SURGERY Left    approached from inside mouth & side of neck 1970-1980   TONSILLECTOMY     as child   TOTAL HIP ARTHROPLASTY Left 05/28/2014   Procedure: LEFT TOTAL HIP ARTHROPLASTY;  Surgeon: Yvette Rack., MD;  Location: [facility];  Service: Orthopedics;  Laterality: Left;   TRANSURETHRAL RESECTION OF BLADDER TUMOR N/A 08/23/2021   Procedure: TRANSURETHRAL RESECTION OF BLADDER TUMOR (TURBT) WITH CYSTOSCOPY/ POSTOPERATIVE INSTILLATION OF GEMCITABINE/retrograde pyelogram and stent placement (right);  Surgeon: Ceasar Mons, MD;  Location: [facility];  Service: Urology;  Laterality: N/A;   TRANSURETHRAL RESECTION OF BLADDER TUMOR N/A 01/10/2022   Procedure: TRANSURETHRAL RESECTION OF BLADDER TUMOR (TURBT) WITH CYSTOSCOPY / GEMCITABINE INSTILLATION post-operatively;  Surgeon: Ceasar Mons, MD;  Location: [facility];  Service: Urology;  Laterality: N/A;  ONLY NEEDS 30 MIN    REVIEW OF SYSTEMS:   Review of Systems  Constitutional: Negative for appetite change, chills, fatigue, fever and unexpected weight change.  HENT:   Negative for mouth sores, nosebleeds, sore  throat and trouble swallowing.   Eyes: Negative for eye problems and icterus.  Respiratory: Negative for cough, hemoptysis, shortness of breath and wheezing.   Cardiovascular: Negative for chest pain and leg swelling.  Gastrointestinal: Negative for abdominal pain, constipation, diarrhea, nausea and vomiting.  Genitourinary: Negative for bladder incontinence, difficulty urinating, dysuria, frequency and hematuria.   Musculoskeletal: Positive for chronic back pain. Positive for coccyx pain. Negative for gait problem, neck pain and neck stiffness.  Skin: Negative for itching and rash.  Neurological: Positive for baseline peripheral neuropathy. Negative for dizziness, extremity weakness, gait problem, headaches, light-headedness and seizures.  Hematological: Negative for adenopathy. Does not bruise/bleed easily.  Psychiatric/Behavioral: Negative for confusion, depression and sleep disturbance. The patient is not nervous/anxious.     PHYSICAL EXAMINATION:  Blood pressure 120/65, pulse 64, temperature 98.2 F (36.8 C), temperature source Oral, resp. rate 16, weight 192 lb (87.1 kg), SpO2 98 %.  ECOG PERFORMANCE STATUS: 1  Physical Exam  Constitutional: Oriented to person, place, and time and well-developed, well-nourished, and in no distress.  HENT:  Head: Normocephalic and atraumatic.  Mouth/Throat: Oropharynx is clear and moist. No oropharyngeal exudate.  Eyes: Conjunctivae are normal. Right eye exhibits no discharge. Left eye exhibits no discharge. No scleral icterus.  Neck: Normal range of motion. Neck supple.  Cardiovascular: Normal rate, regular rhythm, normal heart sounds and intact distal pulses.   Pulmonary/Chest: Effort normal and breath sounds normal. No respiratory distress. No wheezes. No rales.  Abdominal: Soft. Bowel sounds are normal. Exhibits no distension and no mass. There is no tenderness.  Musculoskeletal: Normal range of motion. Exhibits no edema.  Lymphadenopathy:     No cervical adenopathy.  Neurological: Alert and oriented to person, place, and time. Exhibits normal muscle tone. Gait normal. Coordination normal.  Skin: Skin is warm and dry. No rash noted. Not diaphoretic. No erythema. No pallor.  Psychiatric: Mood, memory and judgment normal.  Vitals reviewed.  LABORATORY DATA: Lab Results  Component Value Date   WBC 6.6 07/12/2022   HGB 10.5 (L) 07/12/2022   HCT 31.2 (L) 07/12/2022   MCV 94.5 07/12/2022   PLT 194 07/12/2022  Chemistry      Component Value Date/Time   NA 139 07/12/2022 1035   K 4.3 07/12/2022 1035   CL 107 07/12/2022 1035   CO2 28 07/12/2022 1035   BUN 15 07/12/2022 1035   CREATININE 1.24 (H) 07/12/2022 1035      Component Value Date/Time   CALCIUM 8.8 (L) 07/12/2022 1035   ALKPHOS 119 07/12/2022 1035   AST 13 (L) 07/12/2022 1035   ALT 13 07/12/2022 1035   BILITOT 0.2 (L) 07/12/2022 1035       RADIOGRAPHIC STUDIES:  No results found.   ASSESSMENT/PLAN:  This is a very pleasant 72 year old African-American female with metastatic urothelial carcinoma.  She was initially diagnosed as a stage II (T2, N0, M0) in September 2022.  She is status post right nephroureterectomy as well as  intravesical treatment and resection of superficial tumor in the bladder several times and March 2023 as well as July 2023.  The patient was found to have enlarging and new pulmonary nodules consistent with metastatic disease in November 2023.   Dr. Julien Nordmann sent her tissue for foundation 1 and PD-L1 expression.  The patient is currently undergoing systemic chemotherapy with carboplatin for an AUC of 5 on day 1 and gemcitabine 1000 mg/m on days 1 and 8 IV every 3 weeks with Neulasta support.  Her first dose of treatment was on 06/21/2022.  She is status post 1 cycle of treatment.   Labs were reviewed.  Recommend that she proceed with cycle #2 today scheduled.  We will see her back for follow-up visit in 3 weeks for evaluation for  repeat blood work before starting cycle #3.  We will arrange for her to receive tylenol in the infusion room. It is ok for her ortho provider to prescribe pain medication as long as she is not taking NSAIDs. I reviewed with patient.   The patient was advised to call immediately if she has any concerning symptoms in the interval. The patient voices understanding of current disease status and treatment options and is in agreement with the current care plan. All questions were answered. The patient knows to call the clinic with any problems, questions or concerns. We can certainly see the patient much sooner if necessary   No orders of the defined types were placed in this encounter.    The total time spent in the appointment was 20-29 minutes.   Sandralee Tarkington L Shaunta Oncale, PA-C 07/12/22

## 2022-07-12 ENCOUNTER — Inpatient Hospital Stay: Payer: Medicare Other

## 2022-07-12 ENCOUNTER — Other Ambulatory Visit: Payer: Medicare Other

## 2022-07-12 ENCOUNTER — Inpatient Hospital Stay (HOSPITAL_BASED_OUTPATIENT_CLINIC_OR_DEPARTMENT_OTHER): Payer: Medicare Other | Admitting: Physician Assistant

## 2022-07-12 ENCOUNTER — Other Ambulatory Visit: Payer: Self-pay

## 2022-07-12 VITALS — BP 120/65 | HR 64 | Temp 98.2°F | Resp 16 | Wt 192.0 lb

## 2022-07-12 DIAGNOSIS — R110 Nausea: Secondary | ICD-10-CM | POA: Diagnosis not present

## 2022-07-12 DIAGNOSIS — R252 Cramp and spasm: Secondary | ICD-10-CM | POA: Diagnosis not present

## 2022-07-12 DIAGNOSIS — C7910 Secondary malignant neoplasm of unspecified urinary organs: Secondary | ICD-10-CM | POA: Diagnosis not present

## 2022-07-12 DIAGNOSIS — Z885 Allergy status to narcotic agent status: Secondary | ICD-10-CM | POA: Diagnosis not present

## 2022-07-12 DIAGNOSIS — Z79899 Other long term (current) drug therapy: Secondary | ICD-10-CM | POA: Diagnosis not present

## 2022-07-12 DIAGNOSIS — Z881 Allergy status to other antibiotic agents status: Secondary | ICD-10-CM | POA: Diagnosis not present

## 2022-07-12 DIAGNOSIS — Z87891 Personal history of nicotine dependence: Secondary | ICD-10-CM | POA: Diagnosis not present

## 2022-07-12 DIAGNOSIS — I252 Old myocardial infarction: Secondary | ICD-10-CM | POA: Diagnosis not present

## 2022-07-12 DIAGNOSIS — Z95828 Presence of other vascular implants and grafts: Secondary | ICD-10-CM

## 2022-07-12 DIAGNOSIS — C7800 Secondary malignant neoplasm of unspecified lung: Secondary | ICD-10-CM

## 2022-07-12 DIAGNOSIS — G629 Polyneuropathy, unspecified: Secondary | ICD-10-CM | POA: Diagnosis not present

## 2022-07-12 DIAGNOSIS — G8929 Other chronic pain: Secondary | ICD-10-CM | POA: Diagnosis not present

## 2022-07-12 DIAGNOSIS — Z5111 Encounter for antineoplastic chemotherapy: Secondary | ICD-10-CM | POA: Diagnosis not present

## 2022-07-12 DIAGNOSIS — C679 Malignant neoplasm of bladder, unspecified: Secondary | ICD-10-CM | POA: Diagnosis not present

## 2022-07-12 DIAGNOSIS — I2510 Atherosclerotic heart disease of native coronary artery without angina pectoris: Secondary | ICD-10-CM | POA: Diagnosis not present

## 2022-07-12 DIAGNOSIS — Z7902 Long term (current) use of antithrombotics/antiplatelets: Secondary | ICD-10-CM | POA: Diagnosis not present

## 2022-07-12 DIAGNOSIS — I129 Hypertensive chronic kidney disease with stage 1 through stage 4 chronic kidney disease, or unspecified chronic kidney disease: Secondary | ICD-10-CM | POA: Diagnosis not present

## 2022-07-12 DIAGNOSIS — K5900 Constipation, unspecified: Secondary | ICD-10-CM | POA: Diagnosis not present

## 2022-07-12 DIAGNOSIS — Z5189 Encounter for other specified aftercare: Secondary | ICD-10-CM | POA: Diagnosis not present

## 2022-07-12 LAB — CMP (CANCER CENTER ONLY)
ALT: 13 U/L (ref 0–44)
AST: 13 U/L — ABNORMAL LOW (ref 15–41)
Albumin: 3.5 g/dL (ref 3.5–5.0)
Alkaline Phosphatase: 119 U/L (ref 38–126)
Anion gap: 4 — ABNORMAL LOW (ref 5–15)
BUN: 15 mg/dL (ref 8–23)
CO2: 28 mmol/L (ref 22–32)
Calcium: 8.8 mg/dL — ABNORMAL LOW (ref 8.9–10.3)
Chloride: 107 mmol/L (ref 98–111)
Creatinine: 1.24 mg/dL — ABNORMAL HIGH (ref 0.44–1.00)
GFR, Estimated: 46 mL/min — ABNORMAL LOW (ref >60)
Glucose, Bld: 89 mg/dL (ref 70–99)
Potassium: 4.3 mmol/L (ref 3.5–5.1)
Sodium: 139 mmol/L (ref 135–145)
Total Bilirubin: 0.2 mg/dL — ABNORMAL LOW (ref 0.3–1.2)
Total Protein: 5.9 g/dL — ABNORMAL LOW (ref 6.5–8.1)

## 2022-07-12 LAB — CBC WITH DIFFERENTIAL (CANCER CENTER ONLY)
Abs Immature Granulocytes: 0.1 10*3/uL — ABNORMAL HIGH (ref 0.00–0.07)
Basophils Absolute: 0 10*3/uL (ref 0.0–0.1)
Basophils Relative: 0 %
Eosinophils Absolute: 0.1 10*3/uL (ref 0.0–0.5)
Eosinophils Relative: 1 %
HCT: 31.2 % — ABNORMAL LOW (ref 36.0–46.0)
Hemoglobin: 10.5 g/dL — ABNORMAL LOW (ref 12.0–15.0)
Immature Granulocytes: 2 %
Lymphocytes Relative: 35 %
Lymphs Abs: 2.3 10*3/uL (ref 0.7–4.0)
MCH: 31.8 pg (ref 26.0–34.0)
MCHC: 33.7 g/dL (ref 30.0–36.0)
MCV: 94.5 fL (ref 80.0–100.0)
Monocytes Absolute: 1 10*3/uL (ref 0.1–1.0)
Monocytes Relative: 14 %
Neutro Abs: 3.1 10*3/uL (ref 1.7–7.7)
Neutrophils Relative %: 48 %
Platelet Count: 194 10*3/uL (ref 150–400)
RBC: 3.3 MIL/uL — ABNORMAL LOW (ref 3.87–5.11)
RDW: 13.4 % (ref 11.5–15.5)
WBC Count: 6.6 10*3/uL (ref 4.0–10.5)
nRBC: 0 % (ref 0.0–0.2)

## 2022-07-12 MED ORDER — SODIUM CHLORIDE 0.9% FLUSH
10.0000 mL | Freq: Once | INTRAVENOUS | Status: AC
  Administered 2022-07-12: 10 mL

## 2022-07-12 MED ORDER — HEPARIN SOD (PORK) LOCK FLUSH 100 UNIT/ML IV SOLN
500.0000 [IU] | Freq: Once | INTRAVENOUS | Status: AC | PRN
  Administered 2022-07-12: 500 [IU]

## 2022-07-12 MED ORDER — SODIUM CHLORIDE 0.9 % IV SOLN
150.0000 mg | Freq: Once | INTRAVENOUS | Status: AC
  Administered 2022-07-12: 150 mg via INTRAVENOUS
  Filled 2022-07-12: qty 150

## 2022-07-12 MED ORDER — SODIUM CHLORIDE 0.9 % IV SOLN
1000.0000 mg/m2 | Freq: Once | INTRAVENOUS | Status: AC
  Administered 2022-07-12: 2015 mg via INTRAVENOUS
  Filled 2022-07-12: qty 52.99

## 2022-07-12 MED ORDER — SODIUM CHLORIDE 0.9 % IV SOLN
10.0000 mg | Freq: Once | INTRAVENOUS | Status: AC
  Administered 2022-07-12: 10 mg via INTRAVENOUS
  Filled 2022-07-12: qty 10

## 2022-07-12 MED ORDER — SODIUM CHLORIDE 0.9 % IV SOLN
396.5000 mg | Freq: Once | INTRAVENOUS | Status: AC
  Administered 2022-07-12: 400 mg via INTRAVENOUS
  Filled 2022-07-12: qty 40

## 2022-07-12 MED ORDER — SODIUM CHLORIDE 0.9% FLUSH
10.0000 mL | INTRAVENOUS | Status: DC | PRN
  Administered 2022-07-12: 10 mL

## 2022-07-12 MED ORDER — ACETAMINOPHEN 325 MG PO TABS
650.0000 mg | ORAL_TABLET | Freq: Once | ORAL | Status: AC
  Administered 2022-07-12: 650 mg via ORAL
  Filled 2022-07-12: qty 2

## 2022-07-12 MED ORDER — PALONOSETRON HCL INJECTION 0.25 MG/5ML
0.2500 mg | Freq: Once | INTRAVENOUS | Status: AC
  Administered 2022-07-12: 0.25 mg via INTRAVENOUS
  Filled 2022-07-12: qty 5

## 2022-07-12 MED ORDER — SODIUM CHLORIDE 0.9 % IV SOLN: Freq: Once | INTRAVENOUS | Status: AC

## 2022-07-12 MED ORDER — SODIUM CHLORIDE 0.9 % IV SOLN: Freq: Once | INTRAVENOUS | Status: DC

## 2022-07-12 NOTE — Patient Instructions (Signed)
[facility]  Discharge Instructions: Thank you for choosing [facility] to provide your oncology and hematology care.   If you have a lab appointment with the [facility], please go directly to the [facility] and check in at the registration area.   Wear comfortable clothing and clothing appropriate for easy access to any Portacath or PICC line.   We strive to give you quality time with your provider. You may need to reschedule your appointment if you arrive late (15 or more minutes).  Arriving late affects you and other patients whose appointments are after yours.  Also, if you miss three or more appointments without notifying the office, you may be dismissed from the clinic at the provider's discretion.      For prescription refill requests, have your pharmacy contact our office and allow 72 hours for refills to be completed.    Today you received the following chemotherapy and/or immunotherapy agents: Gemcitabine, Carboplatin.       To help prevent nausea and vomiting after your treatment, we encourage you to take your nausea medication as directed.  BELOW ARE SYMPTOMS THAT SHOULD BE REPORTED IMMEDIATELY: *FEVER GREATER THAN 100.4 F (38 C) OR HIGHER *CHILLS OR SWEATING *NAUSEA AND VOMITING THAT IS NOT CONTROLLED WITH YOUR NAUSEA MEDICATION *UNUSUAL SHORTNESS OF BREATH *UNUSUAL BRUISING OR BLEEDING *URINARY PROBLEMS (pain or burning when urinating, or frequent urination) *BOWEL PROBLEMS (unusual diarrhea, constipation, pain near the anus) TENDERNESS IN MOUTH AND THROAT WITH OR WITHOUT PRESENCE OF ULCERS (sore throat, sores in mouth, or a toothache) UNUSUAL RASH, SWELLING OR PAIN  UNUSUAL VAGINAL DISCHARGE OR ITCHING   Items with * indicate a potential emergency and should be followed up as soon as possible or go to the Emergency Department if any problems should occur.  Please show the CHEMOTHERAPY ALERT CARD or IMMUNOTHERAPY ALERT  CARD at check-in to the Emergency Department and triage nurse.  Should you have questions after your visit or need to cancel or reschedule your appointment, please contact [facility]  Dept: [phone]  and follow the prompts.  Office hours are 8:00 a.m. to 4:30 p.m. Monday - Friday. Please note that voicemails left after 4:00 p.m. may not be returned until the following business day.  We are closed weekends and major holidays. You have access to a nurse at all times for urgent questions. Please call the main number to the clinic Dept: [phone] and follow the prompts.   For any non-urgent questions, you may also contact your provider using MyChart. We now offer e-Visits for anyone 30 and older to request care online for non-urgent symptoms. For details visit [email or web address].   Also download the MyChart app! Go to the app store, search "MyChart", open the app, select [facility], and log in with your MyChart username and password.

## 2022-07-13 ENCOUNTER — Other Ambulatory Visit: Payer: Self-pay

## 2022-07-13 ENCOUNTER — Inpatient Hospital Stay: Payer: Medicare Other | Admitting: Licensed Clinical Social Worker

## 2022-07-13 DIAGNOSIS — C7910 Secondary malignant neoplasm of unspecified urinary organs: Secondary | ICD-10-CM

## 2022-07-13 NOTE — Progress Notes (Signed)
[facility] CSW Progress Note  Clinical Education officer, museum  received a voicemail from pt requesting information about supportive services.  CSW discussed services available through [facility] and followed up w/ an email containing the flyer for supportive services.  Pt also inquired about who she may call to book the [facility].  Contact information for Etheleen Sia given to patient.  Pt booked counseling w/ CSW for 1/26.    Henriette Combs, LCSW

## 2022-07-18 ENCOUNTER — Telehealth: Payer: Self-pay | Admitting: Internal Medicine

## 2022-07-18 NOTE — Telephone Encounter (Signed)
Called patient regarding upcoming February-March appointments, patient is notified.

## 2022-07-19 ENCOUNTER — Other Ambulatory Visit: Payer: Self-pay

## 2022-07-19 ENCOUNTER — Inpatient Hospital Stay: Payer: Medicare Other

## 2022-07-19 ENCOUNTER — Other Ambulatory Visit: Payer: Medicare Other

## 2022-07-19 VITALS — BP 126/70 | HR 71 | Temp 98.4°F | Resp 18 | Wt 191.5 lb

## 2022-07-19 DIAGNOSIS — Z87891 Personal history of nicotine dependence: Secondary | ICD-10-CM | POA: Diagnosis not present

## 2022-07-19 DIAGNOSIS — Z885 Allergy status to narcotic agent status: Secondary | ICD-10-CM | POA: Diagnosis not present

## 2022-07-19 DIAGNOSIS — K5900 Constipation, unspecified: Secondary | ICD-10-CM | POA: Diagnosis not present

## 2022-07-19 DIAGNOSIS — G8929 Other chronic pain: Secondary | ICD-10-CM | POA: Diagnosis not present

## 2022-07-19 DIAGNOSIS — Z5189 Encounter for other specified aftercare: Secondary | ICD-10-CM | POA: Diagnosis not present

## 2022-07-19 DIAGNOSIS — C679 Malignant neoplasm of bladder, unspecified: Secondary | ICD-10-CM

## 2022-07-19 DIAGNOSIS — R252 Cramp and spasm: Secondary | ICD-10-CM | POA: Diagnosis not present

## 2022-07-19 DIAGNOSIS — G629 Polyneuropathy, unspecified: Secondary | ICD-10-CM | POA: Diagnosis not present

## 2022-07-19 DIAGNOSIS — C7910 Secondary malignant neoplasm of unspecified urinary organs: Secondary | ICD-10-CM

## 2022-07-19 DIAGNOSIS — Z79899 Other long term (current) drug therapy: Secondary | ICD-10-CM | POA: Diagnosis not present

## 2022-07-19 DIAGNOSIS — Z5111 Encounter for antineoplastic chemotherapy: Secondary | ICD-10-CM | POA: Diagnosis not present

## 2022-07-19 DIAGNOSIS — Z7902 Long term (current) use of antithrombotics/antiplatelets: Secondary | ICD-10-CM | POA: Diagnosis not present

## 2022-07-19 DIAGNOSIS — Z881 Allergy status to other antibiotic agents status: Secondary | ICD-10-CM | POA: Diagnosis not present

## 2022-07-19 DIAGNOSIS — I252 Old myocardial infarction: Secondary | ICD-10-CM | POA: Diagnosis not present

## 2022-07-19 DIAGNOSIS — I2510 Atherosclerotic heart disease of native coronary artery without angina pectoris: Secondary | ICD-10-CM | POA: Diagnosis not present

## 2022-07-19 DIAGNOSIS — I129 Hypertensive chronic kidney disease with stage 1 through stage 4 chronic kidney disease, or unspecified chronic kidney disease: Secondary | ICD-10-CM | POA: Diagnosis not present

## 2022-07-19 DIAGNOSIS — C7800 Secondary malignant neoplasm of unspecified lung: Secondary | ICD-10-CM | POA: Diagnosis not present

## 2022-07-19 DIAGNOSIS — R110 Nausea: Secondary | ICD-10-CM | POA: Diagnosis not present

## 2022-07-19 DIAGNOSIS — Z95828 Presence of other vascular implants and grafts: Secondary | ICD-10-CM

## 2022-07-19 LAB — CMP (CANCER CENTER ONLY)
ALT: 19 U/L (ref 0–44)
AST: 18 U/L (ref 15–41)
Albumin: 3.4 g/dL — ABNORMAL LOW (ref 3.5–5.0)
Alkaline Phosphatase: 114 U/L (ref 38–126)
Anion gap: 4 — ABNORMAL LOW (ref 5–15)
BUN: 29 mg/dL — ABNORMAL HIGH (ref 8–23)
CO2: 26 mmol/L (ref 22–32)
Calcium: 8.5 mg/dL — ABNORMAL LOW (ref 8.9–10.3)
Chloride: 107 mmol/L (ref 98–111)
Creatinine: 1.49 mg/dL — ABNORMAL HIGH (ref 0.44–1.00)
GFR, Estimated: 37 mL/min — ABNORMAL LOW (ref >60)
Glucose, Bld: 92 mg/dL (ref 70–99)
Potassium: 4.7 mmol/L (ref 3.5–5.1)
Sodium: 137 mmol/L (ref 135–145)
Total Bilirubin: 0.2 mg/dL — ABNORMAL LOW (ref 0.3–1.2)
Total Protein: 6.3 g/dL — ABNORMAL LOW (ref 6.5–8.1)

## 2022-07-19 LAB — CBC WITH DIFFERENTIAL (CANCER CENTER ONLY)
Abs Immature Granulocytes: 0.01 10*3/uL (ref 0.00–0.07)
Basophils Absolute: 0 10*3/uL (ref 0.0–0.1)
Basophils Relative: 1 %
Eosinophils Absolute: 0 10*3/uL (ref 0.0–0.5)
Eosinophils Relative: 0 %
HCT: 29.8 % — ABNORMAL LOW (ref 36.0–46.0)
Hemoglobin: 10.1 g/dL — ABNORMAL LOW (ref 12.0–15.0)
Immature Granulocytes: 0 %
Lymphocytes Relative: 51 %
Lymphs Abs: 1.7 10*3/uL (ref 0.7–4.0)
MCH: 31.5 pg (ref 26.0–34.0)
MCHC: 33.9 g/dL (ref 30.0–36.0)
MCV: 92.8 fL (ref 80.0–100.0)
Monocytes Absolute: 0.3 10*3/uL (ref 0.1–1.0)
Monocytes Relative: 8 %
Neutro Abs: 1.3 10*3/uL — ABNORMAL LOW (ref 1.7–7.7)
Neutrophils Relative %: 40 %
Platelet Count: 282 10*3/uL (ref 150–400)
RBC: 3.21 MIL/uL — ABNORMAL LOW (ref 3.87–5.11)
RDW: 13.8 % (ref 11.5–15.5)
WBC Count: 3.4 10*3/uL — ABNORMAL LOW (ref 4.0–10.5)
nRBC: 0 % (ref 0.0–0.2)

## 2022-07-19 MED ORDER — SODIUM CHLORIDE 0.9 % IV SOLN: Freq: Once | INTRAVENOUS | Status: AC

## 2022-07-19 MED ORDER — PROCHLORPERAZINE MALEATE 10 MG PO TABS
10.0000 mg | ORAL_TABLET | Freq: Once | ORAL | Status: AC
  Administered 2022-07-19: 10 mg via ORAL
  Filled 2022-07-19: qty 1

## 2022-07-19 MED ORDER — SODIUM CHLORIDE 0.9% FLUSH
10.0000 mL | Freq: Once | INTRAVENOUS | Status: AC
  Administered 2022-07-19: 10 mL

## 2022-07-19 MED ORDER — SODIUM CHLORIDE 0.9 % IV SOLN
2000.0000 mg | Freq: Once | INTRAVENOUS | Status: AC
  Administered 2022-07-19: 2000 mg via INTRAVENOUS
  Filled 2022-07-19: qty 52.6

## 2022-07-19 NOTE — Progress Notes (Signed)
Per Cassie Heilingoetter, PA, ok to treat with low ANC

## 2022-07-19 NOTE — Patient Instructions (Signed)
[facility]  Discharge Instructions: Thank you for choosing [facility] to provide your oncology and hematology care.   If you have a lab appointment with the Cancer Center, please go directly to the Cancer Center and check in at the registration area.   Wear comfortable clothing and clothing appropriate for easy access to any Portacath or PICC line.   We strive to give you quality time with your provider. You may need to reschedule your appointment if you arrive late (15 or more minutes).  Arriving late affects you and other patients whose appointments are after yours.  Also, if you miss three or more appointments without notifying the office, you may be dismissed from the clinic at the provider's discretion.      For prescription refill requests, have your pharmacy contact our office and allow 72 hours for refills to be completed.    Today you received the following chemotherapy and/or immunotherapy agents: gemcitabine      To help prevent nausea and vomiting after your treatment, we encourage you to take your nausea medication as directed.  BELOW ARE SYMPTOMS THAT SHOULD BE REPORTED IMMEDIATELY: *FEVER GREATER THAN 100.4 F (38 C) OR HIGHER *CHILLS OR SWEATING *NAUSEA AND VOMITING THAT IS NOT CONTROLLED WITH YOUR NAUSEA MEDICATION *UNUSUAL SHORTNESS OF BREATH *UNUSUAL BRUISING OR BLEEDING *URINARY PROBLEMS (pain or burning when urinating, or frequent urination) *BOWEL PROBLEMS (unusual diarrhea, constipation, pain near the anus) TENDERNESS IN MOUTH AND THROAT WITH OR WITHOUT PRESENCE OF ULCERS (sore throat, sores in mouth, or a toothache) UNUSUAL RASH, SWELLING OR PAIN  UNUSUAL VAGINAL DISCHARGE OR ITCHING   Items with * indicate a potential emergency and should be followed up as soon as possible or go to the Emergency Department if any problems should occur.  Please show the CHEMOTHERAPY ALERT CARD or IMMUNOTHERAPY ALERT CARD at  check-in to the Emergency Department and triage nurse.  Should you have questions after your visit or need to cancel or reschedule your appointment, please contact [facility]  Dept: [phone]  and follow the prompts.  Office hours are 8:00 a.m. to 4:30 p.m. Monday - Friday. Please note that voicemails left after 4:00 p.m. may not be returned until the following business day.  We are closed weekends and major holidays. You have access to a nurse at all times for urgent questions. Please call the main number to the clinic Dept: [phone] and follow the prompts.   For any non-urgent questions, you may also contact your provider using MyChart. We now offer e-Visits for anyone 18 and older to request care online for non-urgent symptoms. For details visit [email or web address].   Also download the MyChart app! Go to the app store, search "MyChart", open the app, select [facility], and log in with your MyChart username and password.   

## 2022-07-20 ENCOUNTER — Inpatient Hospital Stay: Payer: Medicare Other | Admitting: Licensed Clinical Social Worker

## 2022-07-20 DIAGNOSIS — C7910 Secondary malignant neoplasm of unspecified urinary organs: Secondary | ICD-10-CM

## 2022-07-20 NOTE — Progress Notes (Signed)
[facility] CSW Counseling Note  Patient was referred by medical provider. Treatment type: Individual  Presenting Concerns: Patient and/or family reports the following symptoms/concerns: anxiety, depression, and stress Duration of problem: 3 months; Severity of problem: moderate   Orientation:oriented to person, place, time/date, situation, day of week, month of year, and year.   Affect: Full Range Risk of harm to self or others: No plan to harm self or others  Patient and/or Family's Strengths/Protective Factors: Social connections, Social and Emotional competence, Concrete supports in place (healthy food, safe environments, etc.), and Sense of purposeAbility for insight  Capable of independent living  Communication skills  Motivation for treatment/growth  Religious Affiliation  Special hobby/interest  Supportive family/friends      Goals Addressed: Patient will:  Reduce symptoms of: anxiety and depression Increase knowledge and/or ability of: coping skills  Increase healthy adjustment to current life circumstances and Increase adequate support systems for patient/family   Progress towards Goals: Initial   Interventions: Interventions utilized:  CBT, Solution Focused, Strength-based, and Supportive      Assessment: Patient currently experiencing anxiety and depression connected to recent recurrence and metastatic spread of cancer.  Pt has experienced a great deal of loss on her life and within the last year she has lost a sister to cancer and watched her daughter undergo chemotherapy.  Pt also has a number of cousins and close relatives who seem to have recently been diagnosed with some form of cancer.  Pt is experiencing fatigue due to chemo and recently fell which has caused severe pain in her back from previous back surgeries.  The limits physically are adding to pt's depression as she values her independence and it is difficult to look around and see things undone in her home and  know she doesn't have the energy to do it.  Emotional support provided.  CSW utilized solutions focused therapy to discuss ways to accomplish tasks which utilize pt's resources and friends.  Pt has been very active in volunteer work.  CSW discussed w/ pt her motivation for engaging in volunteer work and how assisting others makes her feel.  Pt encouraged to try to view asking others for assistance from that perspective as many friends and family members would benefit from being able to give back.  Pt encouraged to identify individuals in her life she believes she can count on to assist with some tasks at home.  At next session CSW and pt to collaborate on a plan to utilize her resources.      Plan: Follow up with CSW: Pt to call CSW to book next session Behavioral recommendations:  Referral(s): Support group(s):  CSW to discuss [facility] group with pt in March       Jari Carollo R Kimberli Winne, LCSW

## 2022-07-21 ENCOUNTER — Inpatient Hospital Stay: Payer: Medicare Other

## 2022-07-21 VITALS — BP 141/64 | HR 81 | Temp 97.3°F | Resp 17

## 2022-07-21 DIAGNOSIS — Z5111 Encounter for antineoplastic chemotherapy: Secondary | ICD-10-CM | POA: Diagnosis not present

## 2022-07-21 DIAGNOSIS — C679 Malignant neoplasm of bladder, unspecified: Secondary | ICD-10-CM | POA: Diagnosis not present

## 2022-07-21 DIAGNOSIS — G629 Polyneuropathy, unspecified: Secondary | ICD-10-CM | POA: Diagnosis not present

## 2022-07-21 DIAGNOSIS — I129 Hypertensive chronic kidney disease with stage 1 through stage 4 chronic kidney disease, or unspecified chronic kidney disease: Secondary | ICD-10-CM | POA: Diagnosis not present

## 2022-07-21 DIAGNOSIS — I2510 Atherosclerotic heart disease of native coronary artery without angina pectoris: Secondary | ICD-10-CM | POA: Diagnosis not present

## 2022-07-21 DIAGNOSIS — Z79899 Other long term (current) drug therapy: Secondary | ICD-10-CM | POA: Diagnosis not present

## 2022-07-21 DIAGNOSIS — Z885 Allergy status to narcotic agent status: Secondary | ICD-10-CM | POA: Diagnosis not present

## 2022-07-21 DIAGNOSIS — Z881 Allergy status to other antibiotic agents status: Secondary | ICD-10-CM | POA: Diagnosis not present

## 2022-07-21 DIAGNOSIS — C7800 Secondary malignant neoplasm of unspecified lung: Secondary | ICD-10-CM | POA: Diagnosis not present

## 2022-07-21 DIAGNOSIS — C7910 Secondary malignant neoplasm of unspecified urinary organs: Secondary | ICD-10-CM

## 2022-07-21 DIAGNOSIS — G8929 Other chronic pain: Secondary | ICD-10-CM | POA: Diagnosis not present

## 2022-07-21 DIAGNOSIS — Z87891 Personal history of nicotine dependence: Secondary | ICD-10-CM | POA: Diagnosis not present

## 2022-07-21 DIAGNOSIS — Z7902 Long term (current) use of antithrombotics/antiplatelets: Secondary | ICD-10-CM | POA: Diagnosis not present

## 2022-07-21 DIAGNOSIS — R110 Nausea: Secondary | ICD-10-CM | POA: Diagnosis not present

## 2022-07-21 DIAGNOSIS — K5900 Constipation, unspecified: Secondary | ICD-10-CM | POA: Diagnosis not present

## 2022-07-21 DIAGNOSIS — Z5189 Encounter for other specified aftercare: Secondary | ICD-10-CM | POA: Diagnosis not present

## 2022-07-21 DIAGNOSIS — R252 Cramp and spasm: Secondary | ICD-10-CM | POA: Diagnosis not present

## 2022-07-21 DIAGNOSIS — I252 Old myocardial infarction: Secondary | ICD-10-CM | POA: Diagnosis not present

## 2022-07-21 MED ORDER — PEGFILGRASTIM INJECTION 6 MG/0.6ML ~~LOC~~
6.0000 mg | PREFILLED_SYRINGE | Freq: Once | SUBCUTANEOUS | Status: AC
  Administered 2022-07-21: 6 mg via SUBCUTANEOUS

## 2022-07-25 ENCOUNTER — Other Ambulatory Visit: Payer: Self-pay | Admitting: Cardiology

## 2022-07-30 DIAGNOSIS — M545 Low back pain, unspecified: Secondary | ICD-10-CM | POA: Diagnosis not present

## 2022-07-30 DIAGNOSIS — M25551 Pain in right hip: Secondary | ICD-10-CM | POA: Diagnosis not present

## 2022-08-01 MED FILL — Dexamethasone Sodium Phosphate Inj 100 MG/10ML: INTRAMUSCULAR | Qty: 1 | Status: AC

## 2022-08-01 MED FILL — Fosaprepitant Dimeglumine For IV Infusion 150 MG (Base Eq): INTRAVENOUS | Qty: 5 | Status: AC

## 2022-08-01 NOTE — Progress Notes (Unsigned)
[facility] OFFICE PROGRESS NOTE  Morgan Torres, [address] [place] [address]  DIAGNOSIS: Metastatic urothelial carcinoma that was initially diagnosed as stage II (T2, N0, M0) in September 2022 status post right nephroureterectomy as well as intravesical treatment and resection of superficial tumor in the bladder several times and March 2023 as well as July 2023.  The patient was found to have enlarging and new pulmonary nodules consistent with metastatic disease in November 2023.   PRIOR THERAPY: Right nephroureterectomy as well as intravesical treatment and resection of superficial tumor in the bladder several times and March 2023 as well as July 2023.      CURRENT THERAPY: Palliative systemic chemotherapy with carboplatin for AUC of 5 on day 1 and gemcitabine 1000 mg/M2 on days 1 and 8 every 3 weeks. First cycle June 21, 2022. Status post  2 cycles.    INTERVAL HISTORY: Morgan Torres 72 y.o. female returns to the clinic today for a follow-up visit.  The patient was last seen by myself on 07/12/2022.  The patient is currently undergoing systemic chemotherapy is on days 1 and 8 IV every 3 weeks.  She is thus far tolerating treatment well without any adverse side effects except she sometimes gets some degree of neutropenia for which she receives neulasta. She also has some fatigue. She is wondering today what she can do about fatigue. She also mentions her PCP prescribes her ativan but she thinks the dose is not effective. She is seeing Education officer, [employer] regarding her depression and anxiety. She also mentions she has a hard time sleeping.  She denies any signs and symptoms of infection in the interval.  She denies any fever, chills, night sweats, unexplained weight loss.  She reports she has a good appetite.  Denies any chest pain, shortness of breath, cough, or hemoptysis.  She denies any nausea, vomiting, or diarrhea.  She occasionally gets constipation for which she takes  MiraLAX and smooth move tea.  She denies any blood in the urine.  She denies any changes in her chronic back pain. She had a fall a few weeks ago and injured her tail bone. She received a steroid injection which has helped and she seems a lot more comfortable today compared to when I saw her last time. She is wondering if she periodically can have a glass of wine. She also wants to know when she should get the COVID and RSV vaccine. She is here today for evaluation and repeat blood work before undergoing cycle #3.     MEDICAL HISTORY: Past Medical History:  Diagnosis Date   Anxiety    Arthritis    hip, lumbar spine    Asthma    seasonal allergies    Bronchitis    Hx: of   Chronic kidney disease    COPD (chronic obstructive pulmonary disease) (HCC)    Coronary artery disease    Depression    Fibromyalgia    Hypertension    Lumbar herniated disc    Myocardial infarction ([place]) 06/25/2012   followed by Dr. Debara Pickett, treated medically, no stents   Peripheral arterial disease ([place])    of right foot   Pneumonia    Hx: of yrs ago   Sciatica    Sleep apnea    Small bowel obstruction ([place])    several yrs ago    ALLERGIES:  is allergic to diclofenac, codeine, hydrocodone, and ciprofloxacin.  MEDICATIONS:  Current Outpatient Medications  Medication  Sig Dispense Refill   acetaminophen (TYLENOL) 500 MG tablet Take 2 tablets (1,000 mg total) by mouth every 6 (six) hours as needed for mild pain. 30 tablet 0   albuterol (PROVENTIL HFA;VENTOLIN HFA) 108 (90 Base) MCG/ACT inhaler Inhale 2 puffs into the lungs every 6 (six) hours as needed for wheezing or shortness of breath. 1 Inhaler 0   amLODipine (NORVASC) 5 MG tablet TAKE 1 TABLET BY MOUTH EVERY DAY 90 tablet 0   aspirin EC 81 MG tablet Take 81 mg by mouth daily. Swallow whole.     atorvastatin (LIPITOR) 20 MG tablet Take 20 mg by mouth. Monday wed and Friday in am     buPROPion (WELLBUTRIN SR) 150 MG 12 hr tablet Take 150 mg by mouth 2  (two) times daily.     cilostazol (PLETAL) 100 MG tablet Take 100 mg by mouth 2 (two) times daily.     EPINEPHrine (EPIPEN 2-PAK) 0.3 mg/0.3 mL DEVI Inject 0.3 mLs (0.3 mg total) into the muscle once as needed (for severe allergic reaction). CAll 911 immediately if you have to use this medicine 1 Device 1   lidocaine-prilocaine (EMLA) cream Apply to the Port-A-Cath site 30-60-minute before treatment 30 g 0   LORazepam (ATIVAN) 0.5 MG tablet Take 0.5 mg by mouth daily as needed for anxiety.     losartan-hydrochlorothiazide (HYZAAR) 100-12.5 MG tablet Take 1 tablet by mouth daily.     metoprolol tartrate (LOPRESSOR) 25 MG tablet TAKE 1 TABLET BY MOUTH 2 TIMES DAILY. PLEASE SCHEDULE APPOINTMENT FOR FURTHER REFILLS 60 tablet 1   montelukast (SINGULAIR) 10 MG tablet Take 10 mg by mouth at bedtime.     mupirocin ointment (BACTROBAN) 2 % Apply 1 Application topically 2 (two) times daily. 22 g 0   nicotine (NICODERM CQ - DOSED IN MG/24 HOURS) 21 mg/24hr patch Place 21 mg onto the skin daily.     nicotine polacrilex (NICORETTE) 4 MG gum Take 4 mg by mouth as needed for smoking cessation.     nitroGLYCERIN (NITROSTAT) 0.4 MG SL tablet Place 1 tablet (0.4 mg total) under the tongue every 5 (five) minutes x 3 doses as needed for chest pain. 25 tablet 2   oxybutynin (DITROPAN) 5 MG tablet Take 1 tablet (5 mg total) by mouth every 8 (eight) hours as needed for bladder spasms. 30 tablet 1   phenazopyridine (PYRIDIUM) 200 MG tablet Take 1 tablet by mouth 3 (three) times daily as needed (for pain with urination). 30 tablet 0   polyethylene glycol (MIRALAX / GLYCOLAX) 17 g packet Take 17 g by mouth as needed.     prochlorperazine (COMPAZINE) 10 MG tablet Take 1 tablet (10 mg total) by mouth every 6 (six) hours as needed for nausea or vomiting. 30 tablet 0   traMADol (ULTRAM) 50 MG tablet Take by mouth every 6 (six) hours as needed. Not taking     No current facility-administered medications for this visit.     SURGICAL HISTORY:  Past Surgical History:  Procedure Laterality Date   ABDOMINAL SURGERY     resection of small intestine   ANTERIOR CRUCIATE LIGAMENT REPAIR Bilateral    BACK SURGERY     fusion of lumbar   CARDIAC CATHETERIZATION  06/25/2012   COLON SURGERY     resection for bowel - for obstruction  6 to 7 yrs ago per pt on 01-05-2022   COLONOSCOPY     Hx; of   DILATION AND CURETTAGE OF UTERUS  EYE SURGERY Bilateral    cataracts removed   HERNIA REPAIR  02/23/1969   inguinal hernia    IR IMAGING GUIDED PORT INSERTION  06/07/2022   LEFT HEART CATHETERIZATION WITH CORONARY ANGIOGRAM N/A 03/30/2013   Procedure: LEFT HEART CATHETERIZATION WITH CORONARY ANGIOGRAM;  Surgeon: Leonie Man, MD;  Location: [facility] CATH LAB;  Service: Cardiovascular;  Laterality: N/A;   left thumb surgery     yrs ago   ROBOT ASSITED LAPAROSCOPIC NEPHROURETERECTOMY Left 03/09/2021   Procedure: XI ROBOT ASSITED LAPAROSCOPIC NEPHROURETERECTOMY/ CYSTOSCOPY WITH LEFT URETEROSCOPY WITH TRANSURETHRAL RESECTION OF URETERAL ORIFICE;  Surgeon: Ceasar Mons, MD;  Location: [facility] ORS;  Service: Urology;  Laterality: Left;   SALIVARY GLAND SURGERY Left    approached from inside mouth & side of neck 1970-1980   TONSILLECTOMY     as child   TOTAL HIP ARTHROPLASTY Left 05/28/2014   Procedure: LEFT TOTAL HIP ARTHROPLASTY;  Surgeon: Yvette Rack., MD;  Location: [facility];  Service: Orthopedics;  Laterality: Left;   TRANSURETHRAL RESECTION OF BLADDER TUMOR N/A 08/23/2021   Procedure: TRANSURETHRAL RESECTION OF BLADDER TUMOR (TURBT) WITH CYSTOSCOPY/ POSTOPERATIVE INSTILLATION OF GEMCITABINE/retrograde pyelogram and stent placement (right);  Surgeon: Ceasar Mons, MD;  Location: [facility];  Service: Urology;  Laterality: N/A;   TRANSURETHRAL RESECTION OF BLADDER TUMOR N/A 01/10/2022   Procedure: TRANSURETHRAL RESECTION OF BLADDER TUMOR (TURBT) WITH CYSTOSCOPY / GEMCITABINE INSTILLATION  post-operatively;  Surgeon: Ceasar Mons, MD;  Location[facility];  Service: Urology;  Laterality: N/A;  ONLY NEEDS 30 MIN    REVIEW OF SYSTEMS:   Review of Systems  Constitutional: Positive for fatigue. Negative for appetite change, chills, fever and unexpected weight change.  HENT: Negative for mouth sores, nosebleeds, sore throat and trouble swallowing.   Eyes: Negative for eye problems and icterus.  Respiratory: Negative for cough, hemoptysis, shortness of breath and wheezing.   Cardiovascular: Negative for chest pain and leg swelling.  Gastrointestinal: Positive for occasional constipation. Negative for abdominal pain,  diarrhea, nausea and vomiting.  Genitourinary: Negative for bladder incontinence, difficulty urinating, dysuria, frequency and hematuria.   Musculoskeletal: Negative for back pain, gait problem, neck pain and neck stiffness.  Skin: Negative for itching and rash.  Neurological: Negative for dizziness, extremity weakness, gait problem, headaches, light-headedness and seizures.  Hematological: Negative for adenopathy. Does not bruise/bleed easily.  Psychiatric/Behavioral: Negative for confusion, depression and sleep disturbance. The patient is not nervous/anxious.     PHYSICAL EXAMINATION:  There were no vitals taken for this visit.  ECOG PERFORMANCE STATUS: 1  Physical Exam  Constitutional: Oriented to person, place, and time and well-developed, well-nourished, and in no distress.  HENT:  Head: Normocephalic and atraumatic.  Mouth/Throat: Oropharynx is clear and moist. No oropharyngeal exudate.  Eyes: Conjunctivae are normal. Right eye exhibits no discharge. Left eye exhibits no discharge. No scleral icterus.  Neck: Normal range of motion. Neck supple.  Cardiovascular: Normal rate, regular rhythm, normal heart sounds and intact distal pulses.   Pulmonary/Chest: Effort normal and breath sounds normal. No respiratory distress. No  wheezes. No rales.  Abdominal: Soft. Bowel sounds are normal. Exhibits no distension and no mass. There is no tenderness.  Musculoskeletal: Normal range of motion. Exhibits no edema.  Lymphadenopathy:    No cervical adenopathy.  Neurological: Alert and oriented to person, place, and time. Exhibits normal muscle tone. Gait normal. Coordination normal.  Skin: Skin is warm and dry. No rash noted. Not diaphoretic. No erythema. No  pallor.  Psychiatric: Mood, memory and judgment normal.  Vitals reviewed.  LABORATORY DATA: Lab Results  Component Value Date   WBC 3.4 (L) 07/19/2022   HGB 10.1 (L) 07/19/2022   HCT 29.8 (L) 07/19/2022   MCV 92.8 07/19/2022   PLT 282 07/19/2022      Chemistry      Component Value Date/Time   NA 137 07/19/2022 1210   K 4.7 07/19/2022 1210   CL 107 07/19/2022 1210   CO2 26 07/19/2022 1210   BUN 29 (H) 07/19/2022 1210   CREATININE 1.49 (H) 07/19/2022 1210      Component Value Date/Time   CALCIUM 8.5 (L) 07/19/2022 1210   ALKPHOS 114 07/19/2022 1210   AST 18 07/19/2022 1210   ALT 19 07/19/2022 1210   BILITOT 0.2 (L) 07/19/2022 1210       RADIOGRAPHIC STUDIES:  No results found.   ASSESSMENT/PLAN:  This is a very pleasant 72 year old African-American female with metastatic urothelial carcinoma.  She was initially diagnosed as a stage II (T2, N0, M0) in September 2022.   She is status post right nephroureterectomy as well as  intravesical treatment and resection of superficial tumor in the bladder several times and March 2023 as well as July 2023.  The patient was found to have enlarging and new pulmonary nodules consistent with metastatic disease in November 2023.    Dr. Julien Nordmann sent her tissue for foundation 1 and PD-L1 expression.   The patient is currently undergoing systemic chemotherapy with carboplatin for an AUC of 5 on day 1 and gemcitabine 1000 mg/m on days 1 and 8 IV every 3 weeks with Neulasta support.  Her first dose of treatment was  on 06/21/2022.  She is status post 2 cycles of treatment.  Labs were reviewed.  Recommend that she proceed with cycle #3 today scheduled.   I will arrange for restaging CT scan of chest, abdomen, pelvis prior to her next appointment.  I will order this without contrast due to her having history of right nephroureterectomy some degree of CKD.  We will see her back for follow-up visit in 3 weeks for evaluation to review her scan results before starting cycle #4.  Regarding her fatigue, she does have some worsening anemia which is likely due to the cumulative doses of chemotherapy.  Encouraged her to take a multivitamin/iron supplement.  Regarding her fatigue I also encouraged her to increase her activity to avoid deconditioning.  Regarding her sleep, encouraged her to try melatonin or Tylenol PM.  Regarding the RSV and COVID vaccines I encouraged her to receive her vaccines on her off week of treatment.  Discussed with the patient that it should be okay for her to have a glass of wine every now and then, if possible, try to space it out from her chemo infusions  The patient is going to talk to her PCP about possibly increasing the dose of her Ativan considering the changes in her health with the cancer diagnosis since she last asked him a year ago for dose adjustment. She will continue to follow up with social work.    The patient was advised to call immediately if she has any concerning symptoms in the interval. The patient voices understanding of current disease status and treatment options and is in agreement with the current care plan. All questions were answered. The patient knows to call the clinic with any problems, questions or concerns. We can certainly see the patient much sooner if necessary  No orders of the defined types were placed in this encounter.    The total time spent in the appointment was 20-29 minutes.   Morgan Torres L Sabeen Piechocki, PA-C 08/01/22

## 2022-08-02 ENCOUNTER — Inpatient Hospital Stay: Payer: Medicare Other | Attending: Internal Medicine

## 2022-08-02 ENCOUNTER — Inpatient Hospital Stay: Payer: Medicare Other

## 2022-08-02 ENCOUNTER — Inpatient Hospital Stay: Payer: Medicare Other | Admitting: Physician Assistant

## 2022-08-02 ENCOUNTER — Other Ambulatory Visit: Payer: Medicare Other

## 2022-08-02 ENCOUNTER — Other Ambulatory Visit: Payer: Self-pay

## 2022-08-02 VITALS — BP 140/62 | HR 70 | Temp 98.2°F | Resp 20 | Wt 190.8 lb

## 2022-08-02 DIAGNOSIS — I252 Old myocardial infarction: Secondary | ICD-10-CM | POA: Diagnosis not present

## 2022-08-02 DIAGNOSIS — Z7902 Long term (current) use of antithrombotics/antiplatelets: Secondary | ICD-10-CM | POA: Diagnosis not present

## 2022-08-02 DIAGNOSIS — D6481 Anemia due to antineoplastic chemotherapy: Secondary | ICD-10-CM | POA: Diagnosis not present

## 2022-08-02 DIAGNOSIS — I10 Essential (primary) hypertension: Secondary | ICD-10-CM | POA: Diagnosis not present

## 2022-08-02 DIAGNOSIS — C7910 Secondary malignant neoplasm of unspecified urinary organs: Secondary | ICD-10-CM

## 2022-08-02 DIAGNOSIS — C7800 Secondary malignant neoplasm of unspecified lung: Secondary | ICD-10-CM

## 2022-08-02 DIAGNOSIS — Z95828 Presence of other vascular implants and grafts: Secondary | ICD-10-CM

## 2022-08-02 DIAGNOSIS — Z79630 Long term (current) use of alkylating agent: Secondary | ICD-10-CM | POA: Diagnosis not present

## 2022-08-02 DIAGNOSIS — F419 Anxiety disorder, unspecified: Secondary | ICD-10-CM | POA: Diagnosis not present

## 2022-08-02 DIAGNOSIS — Z79899 Other long term (current) drug therapy: Secondary | ICD-10-CM | POA: Diagnosis not present

## 2022-08-02 DIAGNOSIS — T451X5A Adverse effect of antineoplastic and immunosuppressive drugs, initial encounter: Secondary | ICD-10-CM | POA: Diagnosis not present

## 2022-08-02 DIAGNOSIS — Z5111 Encounter for antineoplastic chemotherapy: Secondary | ICD-10-CM | POA: Diagnosis not present

## 2022-08-02 DIAGNOSIS — M5136 Other intervertebral disc degeneration, lumbar region: Secondary | ICD-10-CM | POA: Insufficient documentation

## 2022-08-02 DIAGNOSIS — C679 Malignant neoplasm of bladder, unspecified: Secondary | ICD-10-CM | POA: Insufficient documentation

## 2022-08-02 DIAGNOSIS — K7689 Other specified diseases of liver: Secondary | ICD-10-CM | POA: Diagnosis not present

## 2022-08-02 DIAGNOSIS — K5900 Constipation, unspecified: Secondary | ICD-10-CM | POA: Insufficient documentation

## 2022-08-02 DIAGNOSIS — I739 Peripheral vascular disease, unspecified: Secondary | ICD-10-CM | POA: Diagnosis not present

## 2022-08-02 DIAGNOSIS — Z885 Allergy status to narcotic agent status: Secondary | ICD-10-CM | POA: Insufficient documentation

## 2022-08-02 DIAGNOSIS — K862 Cyst of pancreas: Secondary | ICD-10-CM | POA: Insufficient documentation

## 2022-08-02 DIAGNOSIS — I3139 Other pericardial effusion (noninflammatory): Secondary | ICD-10-CM | POA: Diagnosis not present

## 2022-08-02 DIAGNOSIS — R5383 Other fatigue: Secondary | ICD-10-CM | POA: Insufficient documentation

## 2022-08-02 DIAGNOSIS — Z881 Allergy status to other antibiotic agents status: Secondary | ICD-10-CM | POA: Insufficient documentation

## 2022-08-02 DIAGNOSIS — Z9181 History of falling: Secondary | ICD-10-CM | POA: Diagnosis not present

## 2022-08-02 DIAGNOSIS — F32A Depression, unspecified: Secondary | ICD-10-CM | POA: Diagnosis not present

## 2022-08-02 DIAGNOSIS — Z79631 Long term (current) use of antimetabolite agent: Secondary | ICD-10-CM | POA: Diagnosis not present

## 2022-08-02 DIAGNOSIS — Z905 Acquired absence of kidney: Secondary | ICD-10-CM | POA: Insufficient documentation

## 2022-08-02 LAB — CMP (CANCER CENTER ONLY)
ALT: 19 U/L (ref 0–44)
AST: 15 U/L (ref 15–41)
Albumin: 3.6 g/dL (ref 3.5–5.0)
Alkaline Phosphatase: 154 U/L — ABNORMAL HIGH (ref 38–126)
Anion gap: 7 (ref 5–15)
BUN: 19 mg/dL (ref 8–23)
CO2: 25 mmol/L (ref 22–32)
Calcium: 8.6 mg/dL — ABNORMAL LOW (ref 8.9–10.3)
Chloride: 105 mmol/L (ref 98–111)
Creatinine: 1.35 mg/dL — ABNORMAL HIGH (ref 0.44–1.00)
GFR, Estimated: 41 mL/min — ABNORMAL LOW (ref >60)
Glucose, Bld: 92 mg/dL (ref 70–99)
Potassium: 4.5 mmol/L (ref 3.5–5.1)
Sodium: 137 mmol/L (ref 135–145)
Total Bilirubin: 0.2 mg/dL — ABNORMAL LOW (ref 0.3–1.2)
Total Protein: 6.1 g/dL — ABNORMAL LOW (ref 6.5–8.1)

## 2022-08-02 LAB — CBC WITH DIFFERENTIAL (CANCER CENTER ONLY)
Abs Immature Granulocytes: 0.69 10*3/uL — ABNORMAL HIGH (ref 0.00–0.07)
Basophils Absolute: 0.1 10*3/uL (ref 0.0–0.1)
Basophils Relative: 0 %
Eosinophils Absolute: 0.1 10*3/uL (ref 0.0–0.5)
Eosinophils Relative: 1 %
HCT: 27.6 % — ABNORMAL LOW (ref 36.0–46.0)
Hemoglobin: 9.5 g/dL — ABNORMAL LOW (ref 12.0–15.0)
Immature Granulocytes: 3 %
Lymphocytes Relative: 14 %
Lymphs Abs: 3 10*3/uL (ref 0.7–4.0)
MCH: 32.3 pg (ref 26.0–34.0)
MCHC: 34.4 g/dL (ref 30.0–36.0)
MCV: 93.9 fL (ref 80.0–100.0)
Monocytes Absolute: 2.3 10*3/uL — ABNORMAL HIGH (ref 0.1–1.0)
Monocytes Relative: 11 %
Neutro Abs: 14.5 10*3/uL — ABNORMAL HIGH (ref 1.7–7.7)
Neutrophils Relative %: 71 %
Platelet Count: 106 10*3/uL — ABNORMAL LOW (ref 150–400)
RBC: 2.94 MIL/uL — ABNORMAL LOW (ref 3.87–5.11)
RDW: 15 % (ref 11.5–15.5)
WBC Count: 20.6 10*3/uL — ABNORMAL HIGH (ref 4.0–10.5)
nRBC: 0.2 % (ref 0.0–0.2)

## 2022-08-02 MED ORDER — PALONOSETRON HCL INJECTION 0.25 MG/5ML
0.2500 mg | Freq: Once | INTRAVENOUS | Status: AC
  Administered 2022-08-02: 0.25 mg via INTRAVENOUS
  Filled 2022-08-02: qty 5

## 2022-08-02 MED ORDER — SODIUM CHLORIDE 0.9 % IV SOLN
150.0000 mg | Freq: Once | INTRAVENOUS | Status: AC
  Administered 2022-08-02: 150 mg via INTRAVENOUS
  Filled 2022-08-02: qty 150

## 2022-08-02 MED ORDER — SODIUM CHLORIDE 0.9% FLUSH
10.0000 mL | INTRAVENOUS | Status: DC | PRN
  Administered 2022-08-02: 10 mL

## 2022-08-02 MED ORDER — SODIUM CHLORIDE 0.9 % IV SOLN
1000.0000 mg/m2 | Freq: Once | INTRAVENOUS | Status: AC
  Administered 2022-08-02: 2015 mg via INTRAVENOUS
  Filled 2022-08-02: qty 53

## 2022-08-02 MED ORDER — HEPARIN SOD (PORK) LOCK FLUSH 100 UNIT/ML IV SOLN
500.0000 [IU] | Freq: Once | INTRAVENOUS | Status: AC | PRN
  Administered 2022-08-02: 500 [IU]

## 2022-08-02 MED ORDER — SODIUM CHLORIDE 0.9 % IV SOLN
396.5000 mg | Freq: Once | INTRAVENOUS | Status: AC
  Administered 2022-08-02: 400 mg via INTRAVENOUS
  Filled 2022-08-02: qty 40

## 2022-08-02 MED ORDER — SODIUM CHLORIDE 0.9 % IV SOLN: Freq: Once | INTRAVENOUS | Status: AC

## 2022-08-02 MED ORDER — SODIUM CHLORIDE 0.9 % IV SOLN
10.0000 mg | Freq: Once | INTRAVENOUS | Status: AC
  Administered 2022-08-02: 10 mg via INTRAVENOUS
  Filled 2022-08-02: qty 10

## 2022-08-02 MED ORDER — SODIUM CHLORIDE 0.9% FLUSH
10.0000 mL | Freq: Once | INTRAVENOUS | Status: AC
  Administered 2022-08-02: 10 mL

## 2022-08-02 NOTE — Patient Instructions (Signed)
[facility]  Discharge Instructions: Thank you for choosing [facility] to provide your oncology and hematology care.   If you have a lab appointment with the Cancer Center, please go directly to the Cancer Center and check in at the registration area.   Wear comfortable clothing and clothing appropriate for easy access to any Portacath or PICC line.   We strive to give you quality time with your provider. You may need to reschedule your appointment if you arrive late (15 or more minutes).  Arriving late affects you and other patients whose appointments are after yours.  Also, if you miss three or more appointments without notifying the office, you may be dismissed from the clinic at the provider's discretion.      For prescription refill requests, have your pharmacy contact our office and allow 72 hours for refills to be completed.    Today you received the following chemotherapy and/or immunotherapy agent; Gemcitabine & Carboplatin      To help prevent nausea and vomiting after your treatment, we encourage you to take your nausea medication as directed.  BELOW ARE SYMPTOMS THAT SHOULD BE REPORTED IMMEDIATELY: *FEVER GREATER THAN 100.4 F (38 C) OR HIGHER *CHILLS OR SWEATING *NAUSEA AND VOMITING THAT IS NOT CONTROLLED WITH YOUR NAUSEA MEDICATION *UNUSUAL SHORTNESS OF BREATH *UNUSUAL BRUISING OR BLEEDING *URINARY PROBLEMS (pain or burning when urinating, or frequent urination) *BOWEL PROBLEMS (unusual diarrhea, constipation, pain near the anus) TENDERNESS IN MOUTH AND THROAT WITH OR WITHOUT PRESENCE OF ULCERS (sore throat, sores in mouth, or a toothache) UNUSUAL RASH, SWELLING OR PAIN  UNUSUAL VAGINAL DISCHARGE OR ITCHING   Items with * indicate a potential emergency and should be followed up as soon as possible or go to the Emergency Department if any problems should occur.  Please show the CHEMOTHERAPY ALERT CARD or IMMUNOTHERAPY  ALERT CARD at check-in to the Emergency Department and triage nurse.  Should you have questions after your visit or need to cancel or reschedule your appointment, please contact [facility]  Dept: [phone]  and follow the prompts.  Office hours are 8:00 a.m. to 4:30 p.m. Monday - Friday. Please note that voicemails left after 4:00 p.m. may not be returned until the following business day.  We are closed weekends and major holidays. You have access to a nurse at all times for urgent questions. Please call the main number to the clinic Dept: [phone] and follow the prompts.   For any non-urgent questions, you may also contact your provider using MyChart. We now offer e-Visits for anyone 18 and older to request care online for non-urgent symptoms. For details visit [email or web address].   Also download the MyChart app! Go to the app store, search "MyChart", open the app, select , and log in with your MyChart username and password. 

## 2022-08-08 DIAGNOSIS — Z8551 Personal history of malignant neoplasm of bladder: Secondary | ICD-10-CM | POA: Diagnosis not present

## 2022-08-09 ENCOUNTER — Other Ambulatory Visit: Payer: Self-pay | Admitting: Physician Assistant

## 2022-08-09 ENCOUNTER — Inpatient Hospital Stay: Payer: Medicare Other

## 2022-08-09 ENCOUNTER — Other Ambulatory Visit: Payer: Self-pay

## 2022-08-09 ENCOUNTER — Other Ambulatory Visit: Payer: Medicare Other

## 2022-08-09 VITALS — BP 122/65 | HR 60 | Temp 97.9°F | Resp 16 | Wt 191.5 lb

## 2022-08-09 DIAGNOSIS — K7689 Other specified diseases of liver: Secondary | ICD-10-CM | POA: Diagnosis not present

## 2022-08-09 DIAGNOSIS — C7800 Secondary malignant neoplasm of unspecified lung: Secondary | ICD-10-CM | POA: Diagnosis not present

## 2022-08-09 DIAGNOSIS — D6481 Anemia due to antineoplastic chemotherapy: Secondary | ICD-10-CM | POA: Diagnosis not present

## 2022-08-09 DIAGNOSIS — D649 Anemia, unspecified: Secondary | ICD-10-CM

## 2022-08-09 DIAGNOSIS — Z881 Allergy status to other antibiotic agents status: Secondary | ICD-10-CM | POA: Diagnosis not present

## 2022-08-09 DIAGNOSIS — Z5111 Encounter for antineoplastic chemotherapy: Secondary | ICD-10-CM | POA: Diagnosis not present

## 2022-08-09 DIAGNOSIS — Z79630 Long term (current) use of alkylating agent: Secondary | ICD-10-CM | POA: Diagnosis not present

## 2022-08-09 DIAGNOSIS — K5900 Constipation, unspecified: Secondary | ICD-10-CM | POA: Diagnosis not present

## 2022-08-09 DIAGNOSIS — Z885 Allergy status to narcotic agent status: Secondary | ICD-10-CM | POA: Diagnosis not present

## 2022-08-09 DIAGNOSIS — T451X5A Adverse effect of antineoplastic and immunosuppressive drugs, initial encounter: Secondary | ICD-10-CM | POA: Diagnosis not present

## 2022-08-09 DIAGNOSIS — R5383 Other fatigue: Secondary | ICD-10-CM | POA: Diagnosis not present

## 2022-08-09 DIAGNOSIS — M5136 Other intervertebral disc degeneration, lumbar region: Secondary | ICD-10-CM | POA: Diagnosis not present

## 2022-08-09 DIAGNOSIS — Z95828 Presence of other vascular implants and grafts: Secondary | ICD-10-CM

## 2022-08-09 DIAGNOSIS — Z9181 History of falling: Secondary | ICD-10-CM | POA: Diagnosis not present

## 2022-08-09 DIAGNOSIS — R7989 Other specified abnormal findings of blood chemistry: Secondary | ICD-10-CM

## 2022-08-09 DIAGNOSIS — Z79631 Long term (current) use of antimetabolite agent: Secondary | ICD-10-CM | POA: Diagnosis not present

## 2022-08-09 DIAGNOSIS — Z79899 Other long term (current) drug therapy: Secondary | ICD-10-CM | POA: Diagnosis not present

## 2022-08-09 DIAGNOSIS — C7910 Secondary malignant neoplasm of unspecified urinary organs: Secondary | ICD-10-CM

## 2022-08-09 DIAGNOSIS — C679 Malignant neoplasm of bladder, unspecified: Secondary | ICD-10-CM | POA: Diagnosis not present

## 2022-08-09 DIAGNOSIS — I739 Peripheral vascular disease, unspecified: Secondary | ICD-10-CM | POA: Diagnosis not present

## 2022-08-09 DIAGNOSIS — I252 Old myocardial infarction: Secondary | ICD-10-CM | POA: Diagnosis not present

## 2022-08-09 DIAGNOSIS — I3139 Other pericardial effusion (noninflammatory): Secondary | ICD-10-CM | POA: Diagnosis not present

## 2022-08-09 DIAGNOSIS — Z7902 Long term (current) use of antithrombotics/antiplatelets: Secondary | ICD-10-CM | POA: Diagnosis not present

## 2022-08-09 DIAGNOSIS — Z905 Acquired absence of kidney: Secondary | ICD-10-CM | POA: Diagnosis not present

## 2022-08-09 DIAGNOSIS — K862 Cyst of pancreas: Secondary | ICD-10-CM | POA: Diagnosis not present

## 2022-08-09 DIAGNOSIS — F32A Depression, unspecified: Secondary | ICD-10-CM | POA: Diagnosis not present

## 2022-08-09 DIAGNOSIS — I10 Essential (primary) hypertension: Secondary | ICD-10-CM | POA: Diagnosis not present

## 2022-08-09 LAB — CMP (CANCER CENTER ONLY)
ALT: 13 U/L (ref 0–44)
AST: 16 U/L (ref 15–41)
Albumin: 3.6 g/dL (ref 3.5–5.0)
Alkaline Phosphatase: 123 U/L (ref 38–126)
Anion gap: 6 (ref 5–15)
BUN: 35 mg/dL — ABNORMAL HIGH (ref 8–23)
CO2: 26 mmol/L (ref 22–32)
Calcium: 8.3 mg/dL — ABNORMAL LOW (ref 8.9–10.3)
Chloride: 103 mmol/L (ref 98–111)
Creatinine: 1.73 mg/dL — ABNORMAL HIGH (ref 0.44–1.00)
GFR, Estimated: 31 mL/min — ABNORMAL LOW (ref >60)
Glucose, Bld: 100 mg/dL — ABNORMAL HIGH (ref 70–99)
Potassium: 4.2 mmol/L (ref 3.5–5.1)
Sodium: 135 mmol/L (ref 135–145)
Total Bilirubin: 0.3 mg/dL (ref 0.3–1.2)
Total Protein: 6.1 g/dL — ABNORMAL LOW (ref 6.5–8.1)

## 2022-08-09 LAB — CBC WITH DIFFERENTIAL (CANCER CENTER ONLY)
Abs Immature Granulocytes: 0.03 10*3/uL (ref 0.00–0.07)
Basophils Absolute: 0.1 10*3/uL (ref 0.0–0.1)
Basophils Relative: 1 %
Eosinophils Absolute: 0 10*3/uL (ref 0.0–0.5)
Eosinophils Relative: 0 %
HCT: 23.5 % — ABNORMAL LOW (ref 36.0–46.0)
Hemoglobin: 8.1 g/dL — ABNORMAL LOW (ref 12.0–15.0)
Immature Granulocytes: 1 %
Lymphocytes Relative: 34 %
Lymphs Abs: 1.7 10*3/uL (ref 0.7–4.0)
MCH: 32.4 pg (ref 26.0–34.0)
MCHC: 34.5 g/dL (ref 30.0–36.0)
MCV: 94 fL (ref 80.0–100.0)
Monocytes Absolute: 0.3 10*3/uL (ref 0.1–1.0)
Monocytes Relative: 5 %
Neutro Abs: 2.9 10*3/uL (ref 1.7–7.7)
Neutrophils Relative %: 59 %
Platelet Count: 214 10*3/uL (ref 150–400)
RBC: 2.5 MIL/uL — ABNORMAL LOW (ref 3.87–5.11)
RDW: 14.6 % (ref 11.5–15.5)
WBC Count: 4.9 10*3/uL (ref 4.0–10.5)
nRBC: 0 % (ref 0.0–0.2)

## 2022-08-09 MED ORDER — SODIUM CHLORIDE 0.9 % IV SOLN: Freq: Once | INTRAVENOUS | Status: AC

## 2022-08-09 MED ORDER — PROCHLORPERAZINE MALEATE 10 MG PO TABS
10.0000 mg | ORAL_TABLET | Freq: Once | ORAL | Status: AC
  Administered 2022-08-09: 10 mg via ORAL
  Filled 2022-08-09: qty 1

## 2022-08-09 MED ORDER — SODIUM CHLORIDE 0.9% FLUSH
10.0000 mL | Freq: Once | INTRAVENOUS | Status: AC
  Administered 2022-08-09: 10 mL

## 2022-08-09 MED ORDER — SODIUM CHLORIDE 0.9 % IV SOLN
1000.0000 mg/m2 | Freq: Once | INTRAVENOUS | Status: AC
  Administered 2022-08-09: 2015 mg via INTRAVENOUS
  Filled 2022-08-09: qty 53

## 2022-08-09 NOTE — Progress Notes (Signed)
Per Cassie Heilingoetter, PA, ok to proceed without CMP results. Ok to treat with elevated Scr. "Please give 500cc fluid with treatment."

## 2022-08-09 NOTE — Patient Instructions (Signed)
[facility]  Discharge Instructions: Thank you for choosing [facility] to provide your oncology and hematology care.   If you have a lab appointment with the [facility], please go directly to the [facility] and check in at the registration area.   Wear comfortable clothing and clothing appropriate for easy access to any Portacath or PICC line.   We strive to give you quality time with your provider. You may need to reschedule your appointment if you arrive late (15 or more minutes).  Arriving late affects you and other patients whose appointments are after yours.  Also, if you miss three or more appointments without notifying the office, you may be dismissed from the clinic at the provider's discretion.      For prescription refill requests, have your pharmacy contact our office and allow 72 hours for refills to be completed.    Today you received the following chemotherapy and/or immunotherapy agents gemzar      To help prevent nausea and vomiting after your treatment, we encourage you to take your nausea medication as directed.  BELOW ARE SYMPTOMS THAT SHOULD BE REPORTED IMMEDIATELY: *FEVER GREATER THAN 100.4 F (38 C) OR HIGHER *CHILLS OR SWEATING *NAUSEA AND VOMITING THAT IS NOT CONTROLLED WITH YOUR NAUSEA MEDICATION *UNUSUAL SHORTNESS OF BREATH *UNUSUAL BRUISING OR BLEEDING *URINARY PROBLEMS (pain or burning when urinating, or frequent urination) *BOWEL PROBLEMS (unusual diarrhea, constipation, pain near the anus) TENDERNESS IN MOUTH AND THROAT WITH OR WITHOUT PRESENCE OF ULCERS (sore throat, sores in mouth, or a toothache) UNUSUAL RASH, SWELLING OR PAIN  UNUSUAL VAGINAL DISCHARGE OR ITCHING   Items with * indicate a potential emergency and should be followed up as soon as possible or go to the Emergency Department if any problems should occur.  Please show the CHEMOTHERAPY ALERT CARD or IMMUNOTHERAPY ALERT CARD at check-in  to the Emergency Department and triage nurse.  Should you have questions after your visit or need to cancel or reschedule your appointment, please contact [facility]  Dept: [phone]  and follow the prompts.  Office hours are 8:00 a.m. to 4:30 p.m. Monday - Friday. Please note that voicemails left after 4:00 p.m. may not be returned until the following business day.  We are closed weekends and major holidays. You have access to a nurse at all times for urgent questions. Please call the main number to the clinic Dept: [phone] and follow the prompts.   For any non-urgent questions, you may also contact your provider using MyChart. We now offer e-Visits for anyone 65 and older to request care online for non-urgent symptoms. For details visit [email or web address].   Also download the MyChart app! Go to the app store, search "MyChart", open the app, select [facility], and log in with your MyChart username and password.

## 2022-08-11 ENCOUNTER — Inpatient Hospital Stay: Payer: Medicare Other

## 2022-08-11 VITALS — BP 135/51 | HR 72 | Temp 97.5°F | Resp 16

## 2022-08-11 DIAGNOSIS — F32A Depression, unspecified: Secondary | ICD-10-CM | POA: Diagnosis not present

## 2022-08-11 DIAGNOSIS — Z7902 Long term (current) use of antithrombotics/antiplatelets: Secondary | ICD-10-CM | POA: Diagnosis not present

## 2022-08-11 DIAGNOSIS — Z79899 Other long term (current) drug therapy: Secondary | ICD-10-CM | POA: Diagnosis not present

## 2022-08-11 DIAGNOSIS — Z881 Allergy status to other antibiotic agents status: Secondary | ICD-10-CM | POA: Diagnosis not present

## 2022-08-11 DIAGNOSIS — T451X5A Adverse effect of antineoplastic and immunosuppressive drugs, initial encounter: Secondary | ICD-10-CM | POA: Diagnosis not present

## 2022-08-11 DIAGNOSIS — I739 Peripheral vascular disease, unspecified: Secondary | ICD-10-CM | POA: Diagnosis not present

## 2022-08-11 DIAGNOSIS — I252 Old myocardial infarction: Secondary | ICD-10-CM | POA: Diagnosis not present

## 2022-08-11 DIAGNOSIS — Z5111 Encounter for antineoplastic chemotherapy: Secondary | ICD-10-CM | POA: Diagnosis not present

## 2022-08-11 DIAGNOSIS — K862 Cyst of pancreas: Secondary | ICD-10-CM | POA: Diagnosis not present

## 2022-08-11 DIAGNOSIS — Z885 Allergy status to narcotic agent status: Secondary | ICD-10-CM | POA: Diagnosis not present

## 2022-08-11 DIAGNOSIS — C7910 Secondary malignant neoplasm of unspecified urinary organs: Secondary | ICD-10-CM

## 2022-08-11 DIAGNOSIS — Z79631 Long term (current) use of antimetabolite agent: Secondary | ICD-10-CM | POA: Diagnosis not present

## 2022-08-11 DIAGNOSIS — M5136 Other intervertebral disc degeneration, lumbar region: Secondary | ICD-10-CM | POA: Diagnosis not present

## 2022-08-11 DIAGNOSIS — I3139 Other pericardial effusion (noninflammatory): Secondary | ICD-10-CM | POA: Diagnosis not present

## 2022-08-11 DIAGNOSIS — Z79630 Long term (current) use of alkylating agent: Secondary | ICD-10-CM | POA: Diagnosis not present

## 2022-08-11 DIAGNOSIS — D6481 Anemia due to antineoplastic chemotherapy: Secondary | ICD-10-CM | POA: Diagnosis not present

## 2022-08-11 DIAGNOSIS — I10 Essential (primary) hypertension: Secondary | ICD-10-CM | POA: Diagnosis not present

## 2022-08-11 DIAGNOSIS — K5900 Constipation, unspecified: Secondary | ICD-10-CM | POA: Diagnosis not present

## 2022-08-11 DIAGNOSIS — C679 Malignant neoplasm of bladder, unspecified: Secondary | ICD-10-CM | POA: Diagnosis not present

## 2022-08-11 DIAGNOSIS — R5383 Other fatigue: Secondary | ICD-10-CM | POA: Diagnosis not present

## 2022-08-11 DIAGNOSIS — K7689 Other specified diseases of liver: Secondary | ICD-10-CM | POA: Diagnosis not present

## 2022-08-11 DIAGNOSIS — Z9181 History of falling: Secondary | ICD-10-CM | POA: Diagnosis not present

## 2022-08-11 DIAGNOSIS — Z905 Acquired absence of kidney: Secondary | ICD-10-CM | POA: Diagnosis not present

## 2022-08-11 DIAGNOSIS — C7800 Secondary malignant neoplasm of unspecified lung: Secondary | ICD-10-CM | POA: Diagnosis not present

## 2022-08-11 MED ORDER — PEGFILGRASTIM INJECTION 6 MG/0.6ML ~~LOC~~
6.0000 mg | PREFILLED_SYRINGE | Freq: Once | SUBCUTANEOUS | Status: AC
  Administered 2022-08-11: 6 mg via SUBCUTANEOUS

## 2022-08-13 ENCOUNTER — Other Ambulatory Visit: Payer: Self-pay | Admitting: Physician Assistant

## 2022-08-13 ENCOUNTER — Telehealth: Payer: Self-pay | Admitting: Medical Oncology

## 2022-08-13 DIAGNOSIS — R531 Weakness: Secondary | ICD-10-CM

## 2022-08-13 DIAGNOSIS — D6481 Anemia due to antineoplastic chemotherapy: Secondary | ICD-10-CM

## 2022-08-13 NOTE — Telephone Encounter (Signed)
More tired than normal- " I am just so tired. I got fluids last week . I think I might need them again" Denies dizzy, lightheaded.  She drank 32 oz yesterday. Today she already drank 32 ounces.   HGB 8.1 last week . Neulasta given last week .   I instructed her to rest some , but keep active  ,continue fluid intake, multivitamin  and add protein /greens to her meals.

## 2022-08-14 ENCOUNTER — Other Ambulatory Visit: Payer: Self-pay | Admitting: Physician Assistant

## 2022-08-14 ENCOUNTER — Inpatient Hospital Stay: Payer: Medicare Other

## 2022-08-14 ENCOUNTER — Other Ambulatory Visit: Payer: Self-pay

## 2022-08-14 DIAGNOSIS — K5900 Constipation, unspecified: Secondary | ICD-10-CM | POA: Diagnosis not present

## 2022-08-14 DIAGNOSIS — C679 Malignant neoplasm of bladder, unspecified: Secondary | ICD-10-CM | POA: Diagnosis not present

## 2022-08-14 DIAGNOSIS — Z79630 Long term (current) use of alkylating agent: Secondary | ICD-10-CM | POA: Diagnosis not present

## 2022-08-14 DIAGNOSIS — Z7902 Long term (current) use of antithrombotics/antiplatelets: Secondary | ICD-10-CM | POA: Diagnosis not present

## 2022-08-14 DIAGNOSIS — Z79631 Long term (current) use of antimetabolite agent: Secondary | ICD-10-CM | POA: Diagnosis not present

## 2022-08-14 DIAGNOSIS — D6481 Anemia due to antineoplastic chemotherapy: Secondary | ICD-10-CM | POA: Diagnosis not present

## 2022-08-14 DIAGNOSIS — Z95828 Presence of other vascular implants and grafts: Secondary | ICD-10-CM

## 2022-08-14 DIAGNOSIS — Z79899 Other long term (current) drug therapy: Secondary | ICD-10-CM | POA: Diagnosis not present

## 2022-08-14 DIAGNOSIS — C7800 Secondary malignant neoplasm of unspecified lung: Secondary | ICD-10-CM | POA: Diagnosis not present

## 2022-08-14 DIAGNOSIS — M5136 Other intervertebral disc degeneration, lumbar region: Secondary | ICD-10-CM | POA: Diagnosis not present

## 2022-08-14 DIAGNOSIS — I252 Old myocardial infarction: Secondary | ICD-10-CM | POA: Diagnosis not present

## 2022-08-14 DIAGNOSIS — Z905 Acquired absence of kidney: Secondary | ICD-10-CM | POA: Diagnosis not present

## 2022-08-14 DIAGNOSIS — I739 Peripheral vascular disease, unspecified: Secondary | ICD-10-CM | POA: Diagnosis not present

## 2022-08-14 DIAGNOSIS — T451X5A Adverse effect of antineoplastic and immunosuppressive drugs, initial encounter: Secondary | ICD-10-CM

## 2022-08-14 DIAGNOSIS — K862 Cyst of pancreas: Secondary | ICD-10-CM | POA: Diagnosis not present

## 2022-08-14 DIAGNOSIS — R531 Weakness: Secondary | ICD-10-CM

## 2022-08-14 DIAGNOSIS — R5383 Other fatigue: Secondary | ICD-10-CM | POA: Diagnosis not present

## 2022-08-14 DIAGNOSIS — Z5111 Encounter for antineoplastic chemotherapy: Secondary | ICD-10-CM | POA: Diagnosis not present

## 2022-08-14 DIAGNOSIS — Z885 Allergy status to narcotic agent status: Secondary | ICD-10-CM | POA: Diagnosis not present

## 2022-08-14 DIAGNOSIS — K7689 Other specified diseases of liver: Secondary | ICD-10-CM | POA: Diagnosis not present

## 2022-08-14 DIAGNOSIS — D649 Anemia, unspecified: Secondary | ICD-10-CM

## 2022-08-14 DIAGNOSIS — I3139 Other pericardial effusion (noninflammatory): Secondary | ICD-10-CM | POA: Diagnosis not present

## 2022-08-14 DIAGNOSIS — I10 Essential (primary) hypertension: Secondary | ICD-10-CM | POA: Diagnosis not present

## 2022-08-14 DIAGNOSIS — Z9181 History of falling: Secondary | ICD-10-CM | POA: Diagnosis not present

## 2022-08-14 DIAGNOSIS — F32A Depression, unspecified: Secondary | ICD-10-CM | POA: Diagnosis not present

## 2022-08-14 DIAGNOSIS — Z881 Allergy status to other antibiotic agents status: Secondary | ICD-10-CM | POA: Diagnosis not present

## 2022-08-14 LAB — CBC WITH DIFFERENTIAL (CANCER CENTER ONLY)
Abs Immature Granulocytes: 0 10*3/uL (ref 0.00–0.07)
Basophils Absolute: 0 10*3/uL (ref 0.0–0.1)
Basophils Relative: 0 %
Eosinophils Absolute: 0 10*3/uL (ref 0.0–0.5)
Eosinophils Relative: 0 %
HCT: 20.9 % — ABNORMAL LOW (ref 36.0–46.0)
Hemoglobin: 7.4 g/dL — ABNORMAL LOW (ref 12.0–15.0)
Lymphocytes Relative: 14 %
Lymphs Abs: 2.1 10*3/uL (ref 0.7–4.0)
MCH: 33.2 pg (ref 26.0–34.0)
MCHC: 35.4 g/dL (ref 30.0–36.0)
MCV: 93.7 fL (ref 80.0–100.0)
Monocytes Absolute: 0.6 10*3/uL (ref 0.1–1.0)
Monocytes Relative: 4 %
Neutro Abs: 12.5 10*3/uL — ABNORMAL HIGH (ref 1.7–7.7)
Neutrophils Relative %: 82 %
Platelet Count: 87 10*3/uL — ABNORMAL LOW (ref 150–400)
RBC: 2.23 MIL/uL — ABNORMAL LOW (ref 3.87–5.11)
RDW: 14.6 % (ref 11.5–15.5)
Smear Review: NORMAL
WBC Count: 15.3 10*3/uL — ABNORMAL HIGH (ref 4.0–10.5)
nRBC: 0 % (ref 0.0–0.2)

## 2022-08-14 LAB — SAMPLE TO BLOOD BANK

## 2022-08-14 LAB — PREPARE RBC (CROSSMATCH)

## 2022-08-14 MED ORDER — LIDOCAINE-PRILOCAINE 2.5-2.5 % EX CREA: TOPICAL_CREAM | CUTANEOUS | 2 refills | Status: DC

## 2022-08-14 MED ORDER — SODIUM CHLORIDE 0.9% FLUSH
10.0000 mL | Freq: Once | INTRAVENOUS | Status: AC
  Administered 2022-08-14: 10 mL

## 2022-08-14 MED ORDER — SODIUM CHLORIDE 0.9 % IV SOLN: Freq: Once | INTRAVENOUS | Status: AC

## 2022-08-14 MED ORDER — HEPARIN SOD (PORK) LOCK FLUSH 100 UNIT/ML IV SOLN
500.0000 [IU] | Freq: Once | INTRAVENOUS | Status: AC
  Administered 2022-08-14: 500 [IU]

## 2022-08-14 NOTE — Patient Instructions (Signed)

## 2022-08-15 ENCOUNTER — Other Ambulatory Visit: Payer: Self-pay | Admitting: Internal Medicine

## 2022-08-15 ENCOUNTER — Inpatient Hospital Stay: Payer: Medicare Other

## 2022-08-15 ENCOUNTER — Inpatient Hospital Stay: Payer: Medicare Other | Admitting: Licensed Clinical Social Worker

## 2022-08-15 DIAGNOSIS — C679 Malignant neoplasm of bladder, unspecified: Secondary | ICD-10-CM | POA: Diagnosis not present

## 2022-08-15 DIAGNOSIS — Z5111 Encounter for antineoplastic chemotherapy: Secondary | ICD-10-CM | POA: Diagnosis not present

## 2022-08-15 DIAGNOSIS — Z79631 Long term (current) use of antimetabolite agent: Secondary | ICD-10-CM | POA: Diagnosis not present

## 2022-08-15 DIAGNOSIS — Z885 Allergy status to narcotic agent status: Secondary | ICD-10-CM | POA: Diagnosis not present

## 2022-08-15 DIAGNOSIS — T451X5A Adverse effect of antineoplastic and immunosuppressive drugs, initial encounter: Secondary | ICD-10-CM | POA: Diagnosis not present

## 2022-08-15 DIAGNOSIS — C7800 Secondary malignant neoplasm of unspecified lung: Secondary | ICD-10-CM | POA: Diagnosis not present

## 2022-08-15 DIAGNOSIS — Z905 Acquired absence of kidney: Secondary | ICD-10-CM | POA: Diagnosis not present

## 2022-08-15 DIAGNOSIS — I3139 Other pericardial effusion (noninflammatory): Secondary | ICD-10-CM | POA: Diagnosis not present

## 2022-08-15 DIAGNOSIS — I739 Peripheral vascular disease, unspecified: Secondary | ICD-10-CM | POA: Diagnosis not present

## 2022-08-15 DIAGNOSIS — I10 Essential (primary) hypertension: Secondary | ICD-10-CM | POA: Diagnosis not present

## 2022-08-15 DIAGNOSIS — Z79899 Other long term (current) drug therapy: Secondary | ICD-10-CM | POA: Diagnosis not present

## 2022-08-15 DIAGNOSIS — Z7902 Long term (current) use of antithrombotics/antiplatelets: Secondary | ICD-10-CM | POA: Diagnosis not present

## 2022-08-15 DIAGNOSIS — Z79630 Long term (current) use of alkylating agent: Secondary | ICD-10-CM | POA: Diagnosis not present

## 2022-08-15 DIAGNOSIS — F32A Depression, unspecified: Secondary | ICD-10-CM | POA: Diagnosis not present

## 2022-08-15 DIAGNOSIS — Z9181 History of falling: Secondary | ICD-10-CM | POA: Diagnosis not present

## 2022-08-15 DIAGNOSIS — R5383 Other fatigue: Secondary | ICD-10-CM | POA: Diagnosis not present

## 2022-08-15 DIAGNOSIS — M5136 Other intervertebral disc degeneration, lumbar region: Secondary | ICD-10-CM | POA: Diagnosis not present

## 2022-08-15 DIAGNOSIS — K7689 Other specified diseases of liver: Secondary | ICD-10-CM | POA: Diagnosis not present

## 2022-08-15 DIAGNOSIS — D6481 Anemia due to antineoplastic chemotherapy: Secondary | ICD-10-CM

## 2022-08-15 DIAGNOSIS — I252 Old myocardial infarction: Secondary | ICD-10-CM | POA: Diagnosis not present

## 2022-08-15 DIAGNOSIS — K862 Cyst of pancreas: Secondary | ICD-10-CM | POA: Diagnosis not present

## 2022-08-15 DIAGNOSIS — Z881 Allergy status to other antibiotic agents status: Secondary | ICD-10-CM | POA: Diagnosis not present

## 2022-08-15 DIAGNOSIS — K5900 Constipation, unspecified: Secondary | ICD-10-CM | POA: Diagnosis not present

## 2022-08-15 MED ORDER — SODIUM CHLORIDE 0.9% IV SOLUTION
250.0000 mL | Freq: Once | INTRAVENOUS | Status: AC
  Administered 2022-08-15: 250 mL via INTRAVENOUS

## 2022-08-15 MED ORDER — HEPARIN SOD (PORK) LOCK FLUSH 100 UNIT/ML IV SOLN
500.0000 [IU] | Freq: Every day | INTRAVENOUS | Status: AC | PRN
  Administered 2022-08-15: 500 [IU]

## 2022-08-15 MED ORDER — ACETAMINOPHEN 325 MG PO TABS
650.0000 mg | ORAL_TABLET | Freq: Once | ORAL | Status: AC
  Administered 2022-08-15: 650 mg via ORAL
  Filled 2022-08-15: qty 2

## 2022-08-15 MED ORDER — DIPHENHYDRAMINE HCL 25 MG PO CAPS
25.0000 mg | ORAL_CAPSULE | Freq: Once | ORAL | Status: AC
  Administered 2022-08-15: 25 mg via ORAL
  Filled 2022-08-15: qty 1

## 2022-08-15 MED ORDER — SODIUM CHLORIDE 0.9% FLUSH
10.0000 mL | INTRAVENOUS | Status: AC | PRN
  Administered 2022-08-15: 10 mL

## 2022-08-15 NOTE — Patient Instructions (Signed)
Blood Transfusion, Adult A blood transfusion is a procedure in which you receive blood through an IV tube. You may need this procedure because of: A bleeding disorder. An illness. An injury. A surgery. The blood may come from someone else (a donor). You may also be able to donate blood for yourself before a surgery. The blood given in a transfusion may be made up of different types of cells. You may get: Red blood cells. These carry oxygen to the cells in the body. Platelets. These help your blood to clot. Plasma. This is the liquid part of your blood. It carries proteins and other substances through the body. White blood cells. These help you fight infections. If you have a clotting disorder, you may also get other types of blood products. Depending on the type of blood product, this procedure may take 1-4 hours to complete. Tell your doctor about: Any bleeding problems you have. Any reactions you have had during a blood transfusion in the past. Any allergies you have. All medicines you are taking, including vitamins, herbs, eye drops, creams, and over-the-counter medicines. Any surgeries you have had. Any medical conditions you have. Whether you are pregnant or may be pregnant. What are the risks? Talk with your health care provider about risks. The most common problems include: A mild allergic reaction. This includes red, swollen areas of skin (hives) and itching. Fever or chills. This may be the body's response to new blood cells received. This may happen during or up to 4 hours after the transfusion. More serious problems may include: A serious allergic reaction. This includes breathing trouble or swelling around the face and lips. Too much fluid in the lungs. This may cause breathing problems. Lung injury. This causes breathing trouble and low oxygen in the blood. This can happen within hours of the transfusion or days later. Too much iron. This can happen after getting many blood  transfusions over a period of time. An infection or virus passed through the blood. This is rare. Donated blood is carefully tested before it is given. Your body's defense system (immune system) trying to attack the new blood cells. This is rare. Symptoms may include fever, chills, nausea, low blood pressure, and low back or chest pain. Donated cells attacking healthy tissues. This is rare. What happens before the procedure? You will have a blood test to find out your blood type. The test also finds out what type of blood your body will accept and matches it to the donor type. If you are going to have a planned surgery, you may be able to donate your own blood. This may be done in case you need a transfusion. You will have your temperature, blood pressure, and pulse checked. You may receive medicine to help prevent an allergic reaction. This may be done if you have had a reaction to a transfusion before. This medicine may be given to you by mouth or through an IV tube. What happens during the procedure?  An IV tube will be put into one of your veins. The bag of blood will be attached to your IV tube. Then, the blood will enter through your vein. Your temperature, blood pressure, and pulse will be checked often. This is done to find early signs of a transfusion reaction. Tell your nurse right away if you have any of these symptoms: Shortness of breath or trouble breathing. Chest or back pain. Fever or chills. Red, swollen areas of skin or itching. If you have any signs  or symptoms of a reaction, your transfusion will be stopped. You may also be given medicine. When the transfusion is finished, your IV tube will be taken out. Pressure may be put on the IV site for a few minutes. A bandage (dressing) will be put on the IV site. The procedure may vary among doctors and hospitals. What happens after the procedure? You will be monitored until you leave the hospital or clinic. This includes  checking your temperature, blood pressure, pulse, breathing rate, and blood oxygen level. Your blood may be tested to see how you have responded to the transfusion. You may be warmed with fluids or blankets. This is done to keep the temperature of your body normal. If you have your procedure in an outpatient setting, you will be told whom to contact to report any reactions. Where to find more information Visit the [employer]: redcross.org Summary A blood transfusion is a procedure in which you receive blood through an IV tube. The blood you are given may be made up of different blood cells. You may receive red blood cells, platelets, plasma, or white blood cells. Your temperature, blood pressure, and pulse will be checked often. After the procedure, your blood may be tested to see how you have responded. This information is not intended to replace advice given to you by your health care provider. Make sure you discuss any questions you have with your health care provider. Document Revised: 09/08/2021 Document Reviewed: 09/08/2021 Elsevier Patient Education  [place].

## 2022-08-15 NOTE — Progress Notes (Signed)
[facility] CSW Progress Note  Clinical Education officer, museum contacted patient by phone to answer questions regarding supportive services available.  Pt signed up for lung support group as well as a Reiki treatment.  Pt also requesting contact information for [employer] which was provided.  CSW to remain available throughout duration of treatment as appropriate.     Henriette Combs, LCSW

## 2022-08-16 LAB — BPAM RBC
Blood Product Expiration Date: 202403242359
ISSUE DATE / TIME: 202402210937
Unit Type and Rh: 1700

## 2022-08-16 LAB — TYPE AND SCREEN
ABO/RH(D): B NEG
Antibody Screen: NEGATIVE
Unit division: 0

## 2022-08-17 ENCOUNTER — Inpatient Hospital Stay: Payer: Medicare Other | Admitting: Licensed Clinical Social Worker

## 2022-08-17 DIAGNOSIS — C7800 Secondary malignant neoplasm of unspecified lung: Secondary | ICD-10-CM

## 2022-08-17 NOTE — Progress Notes (Signed)
[facility] CSW Progress Note  Hol[employer] representative  received a call from pt regarding out of pocket expense for an upcoming scan.  CSW spoke to pt about the [employer] to offset expenses.  CSW also emailed pt a link to [employer] to register for financial assistance.  CSW to remain available as appropriate to provide supportive services.        Henriette Combs, LCSW

## 2022-08-20 ENCOUNTER — Ambulatory Visit (HOSPITAL_COMMUNITY)
Admission: RE | Admit: 2022-08-20 | Discharge: 2022-08-20 | Disposition: A | Discharge status: Home / Self Care | Payer: Medicare Other | Source: Ambulatory Visit | Attending: Physician Assistant | Admitting: Physician Assistant

## 2022-08-20 ENCOUNTER — Inpatient Hospital Stay: Payer: Medicare Other | Admitting: Licensed Clinical Social Worker

## 2022-08-20 DIAGNOSIS — C7900 Secondary malignant neoplasm of unspecified kidney and renal pelvis: Secondary | ICD-10-CM | POA: Diagnosis not present

## 2022-08-20 DIAGNOSIS — R918 Other nonspecific abnormal finding of lung field: Secondary | ICD-10-CM | POA: Diagnosis not present

## 2022-08-20 DIAGNOSIS — C642 Malignant neoplasm of left kidney, except renal pelvis: Secondary | ICD-10-CM | POA: Insufficient documentation

## 2022-08-20 DIAGNOSIS — C7910 Secondary malignant neoplasm of unspecified urinary organs: Secondary | ICD-10-CM | POA: Insufficient documentation

## 2022-08-20 DIAGNOSIS — I700 Atherosclerosis of aorta: Secondary | ICD-10-CM | POA: Insufficient documentation

## 2022-08-20 DIAGNOSIS — J439 Emphysema, unspecified: Secondary | ICD-10-CM | POA: Diagnosis not present

## 2022-08-20 DIAGNOSIS — K7689 Other specified diseases of liver: Secondary | ICD-10-CM | POA: Diagnosis not present

## 2022-08-20 DIAGNOSIS — I2510 Atherosclerotic heart disease of native coronary artery without angina pectoris: Secondary | ICD-10-CM | POA: Insufficient documentation

## 2022-08-20 DIAGNOSIS — C679 Malignant neoplasm of bladder, unspecified: Secondary | ICD-10-CM

## 2022-08-20 DIAGNOSIS — K862 Cyst of pancreas: Secondary | ICD-10-CM | POA: Insufficient documentation

## 2022-08-20 DIAGNOSIS — C7800 Secondary malignant neoplasm of unspecified lung: Secondary | ICD-10-CM | POA: Insufficient documentation

## 2022-08-20 DIAGNOSIS — Z905 Acquired absence of kidney: Secondary | ICD-10-CM | POA: Diagnosis not present

## 2022-08-20 DIAGNOSIS — N281 Cyst of kidney, acquired: Secondary | ICD-10-CM | POA: Diagnosis not present

## 2022-08-20 NOTE — Progress Notes (Signed)
[facility] CSW Progress Note  Clinical Education officer, museum met with patient to provide w/ [employer] cards.  Link to [employer] also emailed to pt.  CSW to remain available to provide support as appropriate throughout duration of treatment.    Henriette Combs, LCSW

## 2022-08-21 ENCOUNTER — Other Ambulatory Visit: Payer: Self-pay | Admitting: Medical Oncology

## 2022-08-21 DIAGNOSIS — C679 Malignant neoplasm of bladder, unspecified: Secondary | ICD-10-CM

## 2022-08-23 ENCOUNTER — Inpatient Hospital Stay (HOSPITAL_BASED_OUTPATIENT_CLINIC_OR_DEPARTMENT_OTHER): Payer: Medicare Other | Admitting: Internal Medicine

## 2022-08-23 ENCOUNTER — Inpatient Hospital Stay: Payer: Medicare Other

## 2022-08-23 ENCOUNTER — Other Ambulatory Visit: Payer: Medicare Other

## 2022-08-23 ENCOUNTER — Other Ambulatory Visit: Payer: Self-pay

## 2022-08-23 VITALS — BP 139/60 | HR 65 | Temp 97.9°F | Resp 16 | Wt 188.4 lb

## 2022-08-23 DIAGNOSIS — D6481 Anemia due to antineoplastic chemotherapy: Secondary | ICD-10-CM | POA: Diagnosis not present

## 2022-08-23 DIAGNOSIS — C679 Malignant neoplasm of bladder, unspecified: Secondary | ICD-10-CM

## 2022-08-23 DIAGNOSIS — I252 Old myocardial infarction: Secondary | ICD-10-CM | POA: Diagnosis not present

## 2022-08-23 DIAGNOSIS — I3139 Other pericardial effusion (noninflammatory): Secondary | ICD-10-CM | POA: Diagnosis not present

## 2022-08-23 DIAGNOSIS — I739 Peripheral vascular disease, unspecified: Secondary | ICD-10-CM | POA: Diagnosis not present

## 2022-08-23 DIAGNOSIS — M5136 Other intervertebral disc degeneration, lumbar region: Secondary | ICD-10-CM | POA: Diagnosis not present

## 2022-08-23 DIAGNOSIS — C7910 Secondary malignant neoplasm of unspecified urinary organs: Secondary | ICD-10-CM | POA: Diagnosis not present

## 2022-08-23 DIAGNOSIS — Z905 Acquired absence of kidney: Secondary | ICD-10-CM | POA: Diagnosis not present

## 2022-08-23 DIAGNOSIS — F32A Depression, unspecified: Secondary | ICD-10-CM | POA: Diagnosis not present

## 2022-08-23 DIAGNOSIS — Z885 Allergy status to narcotic agent status: Secondary | ICD-10-CM | POA: Diagnosis not present

## 2022-08-23 DIAGNOSIS — Z79899 Other long term (current) drug therapy: Secondary | ICD-10-CM | POA: Diagnosis not present

## 2022-08-23 DIAGNOSIS — Z79630 Long term (current) use of alkylating agent: Secondary | ICD-10-CM | POA: Diagnosis not present

## 2022-08-23 DIAGNOSIS — Z9181 History of falling: Secondary | ICD-10-CM | POA: Diagnosis not present

## 2022-08-23 DIAGNOSIS — C7800 Secondary malignant neoplasm of unspecified lung: Secondary | ICD-10-CM | POA: Diagnosis not present

## 2022-08-23 DIAGNOSIS — Z95828 Presence of other vascular implants and grafts: Secondary | ICD-10-CM

## 2022-08-23 DIAGNOSIS — R5383 Other fatigue: Secondary | ICD-10-CM | POA: Diagnosis not present

## 2022-08-23 DIAGNOSIS — Z7902 Long term (current) use of antithrombotics/antiplatelets: Secondary | ICD-10-CM | POA: Diagnosis not present

## 2022-08-23 DIAGNOSIS — K5900 Constipation, unspecified: Secondary | ICD-10-CM | POA: Diagnosis not present

## 2022-08-23 DIAGNOSIS — Z881 Allergy status to other antibiotic agents status: Secondary | ICD-10-CM | POA: Diagnosis not present

## 2022-08-23 DIAGNOSIS — I10 Essential (primary) hypertension: Secondary | ICD-10-CM | POA: Diagnosis not present

## 2022-08-23 DIAGNOSIS — T451X5A Adverse effect of antineoplastic and immunosuppressive drugs, initial encounter: Secondary | ICD-10-CM | POA: Diagnosis not present

## 2022-08-23 DIAGNOSIS — Z5111 Encounter for antineoplastic chemotherapy: Secondary | ICD-10-CM | POA: Diagnosis not present

## 2022-08-23 DIAGNOSIS — K862 Cyst of pancreas: Secondary | ICD-10-CM | POA: Diagnosis not present

## 2022-08-23 DIAGNOSIS — K7689 Other specified diseases of liver: Secondary | ICD-10-CM | POA: Diagnosis not present

## 2022-08-23 DIAGNOSIS — Z79631 Long term (current) use of antimetabolite agent: Secondary | ICD-10-CM | POA: Diagnosis not present

## 2022-08-23 LAB — CBC WITH DIFFERENTIAL (CANCER CENTER ONLY)
Abs Immature Granulocytes: 1.17 10*3/uL — ABNORMAL HIGH (ref 0.00–0.07)
Basophils Absolute: 0.1 10*3/uL (ref 0.0–0.1)
Basophils Relative: 0 %
Eosinophils Absolute: 0.1 10*3/uL (ref 0.0–0.5)
Eosinophils Relative: 1 %
HCT: 25.9 % — ABNORMAL LOW (ref 36.0–46.0)
Hemoglobin: 8.9 g/dL — ABNORMAL LOW (ref 12.0–15.0)
Immature Granulocytes: 4 %
Lymphocytes Relative: 10 %
Lymphs Abs: 2.8 10*3/uL (ref 0.7–4.0)
MCH: 32.5 pg (ref 26.0–34.0)
MCHC: 34.4 g/dL (ref 30.0–36.0)
MCV: 94.5 fL (ref 80.0–100.0)
Monocytes Absolute: 2.3 10*3/uL — ABNORMAL HIGH (ref 0.1–1.0)
Monocytes Relative: 8 %
Neutro Abs: 21.3 10*3/uL — ABNORMAL HIGH (ref 1.7–7.7)
Neutrophils Relative %: 77 %
Platelet Count: 120 10*3/uL — ABNORMAL LOW (ref 150–400)
RBC: 2.74 MIL/uL — ABNORMAL LOW (ref 3.87–5.11)
RDW: 16.8 % — ABNORMAL HIGH (ref 11.5–15.5)
WBC Count: 27.8 10*3/uL — ABNORMAL HIGH (ref 4.0–10.5)
nRBC: 0.3 % — ABNORMAL HIGH (ref 0.0–0.2)

## 2022-08-23 LAB — CMP (CANCER CENTER ONLY)
ALT: 12 U/L (ref 0–44)
AST: 13 U/L — ABNORMAL LOW (ref 15–41)
Albumin: 3.8 g/dL (ref 3.5–5.0)
Alkaline Phosphatase: 162 U/L — ABNORMAL HIGH (ref 38–126)
Anion gap: 6 (ref 5–15)
BUN: 13 mg/dL (ref 8–23)
CO2: 28 mmol/L (ref 22–32)
Calcium: 8.4 mg/dL — ABNORMAL LOW (ref 8.9–10.3)
Chloride: 104 mmol/L (ref 98–111)
Creatinine: 1.19 mg/dL — ABNORMAL HIGH (ref 0.44–1.00)
GFR, Estimated: 48 mL/min — ABNORMAL LOW (ref >60)
Glucose, Bld: 101 mg/dL — ABNORMAL HIGH (ref 70–99)
Potassium: 3.9 mmol/L (ref 3.5–5.1)
Sodium: 138 mmol/L (ref 135–145)
Total Bilirubin: 0.2 mg/dL — ABNORMAL LOW (ref 0.3–1.2)
Total Protein: 6 g/dL — ABNORMAL LOW (ref 6.5–8.1)

## 2022-08-23 LAB — SAMPLE TO BLOOD BANK

## 2022-08-23 MED ORDER — SODIUM CHLORIDE 0.9 % IV SOLN: Freq: Once | INTRAVENOUS | Status: AC

## 2022-08-23 MED ORDER — PALONOSETRON HCL INJECTION 0.25 MG/5ML
0.2500 mg | Freq: Once | INTRAVENOUS | Status: AC
  Administered 2022-08-23: 0.25 mg via INTRAVENOUS
  Filled 2022-08-23: qty 5

## 2022-08-23 MED ORDER — SODIUM CHLORIDE 0.9 % IV SOLN
150.0000 mg | Freq: Once | INTRAVENOUS | Status: AC
  Administered 2022-08-23: 150 mg via INTRAVENOUS
  Filled 2022-08-23: qty 150

## 2022-08-23 MED ORDER — SODIUM CHLORIDE 0.9 % IV SOLN
10.0000 mg | Freq: Once | INTRAVENOUS | Status: AC
  Administered 2022-08-23: 10 mg via INTRAVENOUS
  Filled 2022-08-23: qty 10

## 2022-08-23 MED ORDER — SODIUM CHLORIDE 0.9% FLUSH
10.0000 mL | Freq: Once | INTRAVENOUS | Status: AC
  Administered 2022-08-23: 10 mL

## 2022-08-23 MED ORDER — SODIUM CHLORIDE 0.9 % IV SOLN
396.5000 mg | Freq: Once | INTRAVENOUS | Status: AC
  Administered 2022-08-23: 400 mg via INTRAVENOUS
  Filled 2022-08-23: qty 40

## 2022-08-23 MED ORDER — SODIUM CHLORIDE 0.9 % IV SOLN
1000.0000 mg/m2 | Freq: Once | INTRAVENOUS | Status: AC
  Administered 2022-08-23: 2015 mg via INTRAVENOUS
  Filled 2022-08-23: qty 53

## 2022-08-23 MED ORDER — HEPARIN SOD (PORK) LOCK FLUSH 100 UNIT/ML IV SOLN
500.0000 [IU] | Freq: Once | INTRAVENOUS | Status: AC | PRN
  Administered 2022-08-23: 500 [IU]

## 2022-08-23 MED ORDER — SODIUM CHLORIDE 0.9% FLUSH
10.0000 mL | INTRAVENOUS | Status: DC | PRN
  Administered 2022-08-23: 10 mL

## 2022-08-23 NOTE — Patient Instructions (Signed)
[facility]  Discharge Instructions: Thank you for choosing [facility] to provide your oncology and hematology care.   If you have a lab appointment with the [facility], please go directly to the [facility] and check in at the registration area.   Wear comfortable clothing and clothing appropriate for easy access to any Portacath or PICC line.   We strive to give you quality time with your provider. You may need to reschedule your appointment if you arrive late (15 or more minutes).  Arriving late affects you and other patients whose appointments are after yours.  Also, if you miss three or more appointments without notifying the office, you may be dismissed from the clinic at the provider's discretion.      For prescription refill requests, have your pharmacy contact our office and allow 72 hours for refills to be completed.    Today you received the following chemotherapy and/or immunotherapy agent; Gemcitabine & Carboplatin      To help prevent nausea and vomiting after your treatment, we encourage you to take your nausea medication as directed.  BELOW ARE SYMPTOMS THAT SHOULD BE REPORTED IMMEDIATELY: *FEVER GREATER THAN 100.4 F (38 C) OR HIGHER *CHILLS OR SWEATING *NAUSEA AND VOMITING THAT IS NOT CONTROLLED WITH YOUR NAUSEA MEDICATION *UNUSUAL SHORTNESS OF BREATH *UNUSUAL BRUISING OR BLEEDING *URINARY PROBLEMS (pain or burning when urinating, or frequent urination) *BOWEL PROBLEMS (unusual diarrhea, constipation, pain near the anus) TENDERNESS IN MOUTH AND THROAT WITH OR WITHOUT PRESENCE OF ULCERS (sore throat, sores in mouth, or a toothache) UNUSUAL RASH, SWELLING OR PAIN  UNUSUAL VAGINAL DISCHARGE OR ITCHING   Items with * indicate a potential emergency and should be followed up as soon as possible or go to the Emergency Department if any problems should occur.  Please show the CHEMOTHERAPY ALERT CARD or IMMUNOTHERAPY  ALERT CARD at check-in to the Emergency Department and triage nurse.  Should you have questions after your visit or need to cancel or reschedule your appointment, please contact [facility]  Dept: [phone]  and follow the prompts.  Office hours are 8:00 a.m. to 4:30 p.m. Monday - Friday. Please note that voicemails left after 4:00 p.m. may not be returned until the following business day.  We are closed weekends and major holidays. You have access to a nurse at all times for urgent questions. Please call the main number to the clinic Dept: [phone] and follow the prompts.   For any non-urgent questions, you may also contact your provider using MyChart. We now offer e-Visits for anyone 33 and older to request care online for non-urgent symptoms. For details visit [email or web address].   Also download the MyChart app! Go to the app store, search "MyChart", open the app, select [facility], and log in with your MyChart username and password.

## 2022-08-23 NOTE — Progress Notes (Signed)
[facility] [place] [phone]   [phone]  OFFICE PROGRESS NOTE  Morgan Beard, MD [address]  [place] [id number]  DIAGNOSIS: Metastatic urothelial carcinoma that was initially diagnosed as stage II (T2, N0, M0) in September 2022 status post right nephroureterectomy as well as intravesical treatment and resection of superficial tumor in the bladder several times and March 2023 as well as July 2023.  The patient was found to have enlarging and new pulmonary nodules consistent with metastatic disease in November 2023.   PRIOR THERAPY: None  CURRENT THERAPY: Palliative systemic chemotherapy with carboplatin for AUC of 5 on day 1 and gemcitabine 1000 mg/M2 on days 1 and 8 every 3 weeks.  First cycle June 21, 2022.  Status post 3 cycles.  INTERVAL HISTORY: Morgan Torres 72 y.o. female returns to the clinic today for follow-up visit.  The patient is feeling fine today with no concerning complaints except for fatigue and low back pain.  She has a history of degenerative disc disease.  She has been taking Tylenol on as-needed basis.  She denied having any current chest pain, shortness of breath, cough or hemoptysis.  She has no nausea, vomiting, diarrhea or constipation.  She has no headache or visual changes.  She has no recent weight loss or night sweats she had repeat CT scan of the chest, abdomen and pelvis performed recently and she is here for evaluation and discussion of her scan results.   MEDICAL HISTORY: Past Medical History:  Diagnosis Date   Anxiety    Arthritis    hip, lumbar spine    Asthma    seasonal allergies    Bronchitis    Hx: of   Chronic kidney disease    COPD (chronic obstructive pulmonary disease) (HCC)    Coronary artery disease    Depression    Fibromyalgia    Hypertension    Lumbar herniated disc    Myocardial infarction ([place]) 06/25/2012   followed by Dr. Debara Pickett, treated medically, no stents   Peripheral arterial  disease ([place])    of right foot   Pneumonia    Hx: of yrs ago   Sciatica    Sleep apnea    Small bowel obstruction ([place])    several yrs ago    ALLERGIES:  is allergic to diclofenac, codeine, hydrocodone, and ciprofloxacin.  MEDICATIONS:  Current Outpatient Medications  Medication Sig Dispense Refill   acetaminophen (TYLENOL) 500 MG tablet Take 2 tablets (1,000 mg total) by mouth every 6 (six) hours as needed for mild pain. 30 tablet 0   albuterol (PROVENTIL HFA;VENTOLIN HFA) 108 (90 Base) MCG/ACT inhaler Inhale 2 puffs into the lungs every 6 (six) hours as needed for wheezing or shortness of breath. 1 Inhaler 0   amLODipine (NORVASC) 5 MG tablet TAKE 1 TABLET BY MOUTH EVERY DAY 90 tablet 0   aspirin EC 81 MG tablet Take 81 mg by mouth daily. Swallow whole.     atorvastatin (LIPITOR) 20 MG tablet Take 20 mg by mouth. Monday wed and Friday in am     buPROPion (WELLBUTRIN SR) 150 MG 12 hr tablet Take 150 mg by mouth 2 (two) times daily.     cilostazol (PLETAL) 100 MG tablet Take 100 mg by mouth 2 (two) times daily.     EPINEPHrine (EPIPEN 2-PAK) 0.3 mg/0.3 mL DEVI Inject 0.3 mLs (0.3 mg total) into the muscle once as needed (for severe allergic reaction). CAll 911  immediately if you have to use this medicine 1 Device 1   lidocaine-prilocaine (EMLA) cream Apply to the Port-A-Cath site 30-60-minute before treatment 30 g 2   LORazepam (ATIVAN) 0.5 MG tablet Take 0.5 mg by mouth daily as needed for anxiety.     losartan-hydrochlorothiazide (HYZAAR) 100-12.5 MG tablet Take 1 tablet by mouth daily.     metoprolol tartrate (LOPRESSOR) 25 MG tablet TAKE 1 TABLET BY MOUTH 2 TIMES DAILY. PLEASE SCHEDULE APPOINTMENT FOR FURTHER REFILLS 60 tablet 1   montelukast (SINGULAIR) 10 MG tablet Take 10 mg by mouth at bedtime.     mupirocin ointment (BACTROBAN) 2 % Apply 1 Application topically 2 (two) times daily. 22 g 0   nicotine (NICODERM CQ - DOSED IN MG/24 HOURS) 21 mg/24hr patch Place 21 mg onto the skin  daily.     nicotine polacrilex (NICORETTE) 4 MG gum Take 4 mg by mouth as needed for smoking cessation.     nitroGLYCERIN (NITROSTAT) 0.4 MG SL tablet Place 1 tablet (0.4 mg total) under the tongue every 5 (five) minutes x 3 doses as needed for chest pain. 25 tablet 2   oxybutynin (DITROPAN) 5 MG tablet Take 1 tablet (5 mg total) by mouth every 8 (eight) hours as needed for bladder spasms. 30 tablet 1   phenazopyridine (PYRIDIUM) 200 MG tablet Take 1 tablet by mouth 3 (three) times daily as needed (for pain with urination). 30 tablet 0   polyethylene glycol (MIRALAX / GLYCOLAX) 17 g packet Take 17 g by mouth as needed.     prochlorperazine (COMPAZINE) 10 MG tablet Take 1 tablet (10 mg total) by mouth every 6 (six) hours as needed for nausea or vomiting. 30 tablet 0   traMADol (ULTRAM) 50 MG tablet Take by mouth every 6 (six) hours as needed. Not taking     No current facility-administered medications for this visit.    SURGICAL HISTORY:  Past Surgical History:  Procedure Laterality Date   ABDOMINAL SURGERY     resection of small intestine   ANTERIOR CRUCIATE LIGAMENT REPAIR Bilateral    BACK SURGERY     fusion of lumbar   CARDIAC CATHETERIZATION  06/25/2012   COLON SURGERY     resection for bowel - for obstruction  6 to 7 yrs ago per pt on 01-05-2022   COLONOSCOPY     Hx; of   DILATION AND CURETTAGE OF UTERUS     EYE SURGERY Bilateral    cataracts removed   HERNIA REPAIR  02/23/1969   inguinal hernia    IR IMAGING GUIDED PORT INSERTION  06/07/2022   LEFT HEART CATHETERIZATION WITH CORONARY ANGIOGRAM N/A 03/30/2013   Procedure: LEFT HEART CATHETERIZATION WITH CORONARY ANGIOGRAM;  Surgeon: Leonie Man, MD;  Location[facility] CATH LAB;  Service: Cardiovascular;  Laterality: N/A;   left thumb surgery     yrs ago   ROBOT ASSITED LAPAROSCOPIC NEPHROURETERECTOMY Left 03/09/2021   Procedure: XI ROBOT ASSITED LAPAROSCOPIC NEPHROURETERECTOMY/ CYSTOSCOPY WITH LEFT URETEROSCOPY WITH  TRANSURETHRAL RESECTION OF URETERAL ORIFICE;  Surgeon: Ceasar Mons, MD;  Location: [facility] ORS;  Service: Urology;  Laterality: Left;   SALIVARY GLAND SURGERY Left    approached from inside mouth & side of neck 1970-1980   TONSILLECTOMY     as child   TOTAL HIP ARTHROPLASTY Left 05/28/2014   Procedure: LEFT TOTAL HIP ARTHROPLASTY;  Surgeon: Yvette Rack., MD;  Location: [facility];  Service: Orthopedics;  Laterality: Left;   TRANSURETHRAL RESECTION OF BLADDER TUMOR  N/A 08/23/2021   Procedure: TRANSURETHRAL RESECTION OF BLADDER TUMOR (TURBT) WITH CYSTOSCOPY/ POSTOPERATIVE INSTILLATION OF GEMCITABINE/retrograde pyelogram and stent placement (right);  Surgeon: Ceasar Mons, MD;  Location: [facility];  Service: Urology;  Laterality: N/A;   TRANSURETHRAL RESECTION OF BLADDER TUMOR N/A 01/10/2022   Procedure: TRANSURETHRAL RESECTION OF BLADDER TUMOR (TURBT) WITH CYSTOSCOPY / GEMCITABINE INSTILLATION post-operatively;  Surgeon: Ceasar Mons, MD;  Location: [facility];  Service: Urology;  Laterality: N/A;  ONLY NEEDS 30 MIN    REVIEW OF SYSTEMS:  Constitutional: positive for fatigue Eyes: negative Ears, nose, mouth, throat, and face: negative Respiratory: positive for dyspnea on exertion Cardiovascular: negative Gastrointestinal: negative Genitourinary:negative Integument/breast: negative Hematologic/lymphatic: negative Musculoskeletal:positive for back pain Neurological: negative Behavioral/Psych: negative Endocrine: negative Allergic/Immunologic: negative   PHYSICAL EXAMINATION: General appearance: alert, cooperative, fatigued, and no distress Head: Normocephalic, without obvious abnormality, atraumatic Neck: no adenopathy, no JVD, supple, symmetrical, trachea midline, and thyroid not enlarged, symmetric, no tenderness/mass/nodules Lymph nodes: Cervical, supraclavicular, and axillary nodes normal. Resp: clear to auscultation  bilaterally Back: symmetric, no curvature. ROM normal. No CVA tenderness. Cardio: regular rate and rhythm, S1, S2 normal, no murmur, click, rub or gallop GI: soft, non-tender; bowel sounds normal; no masses,  no organomegaly Extremities: extremities normal, atraumatic, no cyanosis or edema Neurologic: Alert and oriented X 3, normal strength and tone. Normal symmetric reflexes. Normal coordination and gait  ECOG PERFORMANCE STATUS: 1 - Symptomatic but completely ambulatory  Blood pressure 139/60, pulse 65, temperature 97.9 F (36.6 C), temperature source Oral, resp. rate 16, weight 188 lb 6.4 oz (85.5 kg), SpO2 99 %.  LABORATORY DATA: Lab Results  Component Value Date   WBC 27.8 (H) 08/23/2022   HGB 8.9 (L) 08/23/2022   HCT 25.9 (L) 08/23/2022   MCV 94.5 08/23/2022   PLT 120 (L) 08/23/2022      Chemistry      Component Value Date/Time   NA 135 08/09/2022 1242   K 4.2 08/09/2022 1242   CL 103 08/09/2022 1242   CO2 26 08/09/2022 1242   BUN 35 (H) 08/09/2022 1242   CREATININE 1.73 (H) 08/09/2022 1242      Component Value Date/Time   CALCIUM 8.3 (L) 08/09/2022 1242   ALKPHOS 123 08/09/2022 1242   AST 16 08/09/2022 1242   ALT 13 08/09/2022 1242   BILITOT 0.3 08/09/2022 1242       RADIOGRAPHIC STUDIES: CT Chest Wo Contrast  Result Date: 08/20/2022 CLINICAL DATA:  Metastatic renal cell carcinoma with pulmonary metastases. * Tracking Code: BO * EXAM: CT CHEST, ABDOMEN AND PELVIS WITHOUT CONTRAST TECHNIQUE: Multidetector CT imaging of the chest, abdomen and pelvis was performed following the standard protocol without IV contrast. RADIATION DOSE REDUCTION: This exam was performed according to the departmental dose-optimization program which includes automated exposure control, adjustment of the mA and/or kV according to patient size and/or use of iterative reconstruction technique. COMPARISON:  CT contrast 05/08/2022 and older FINDINGS: CT CHEST FINDINGS Cardiovascular: Right upper  chest port. The thoracic aorta overall has a normal course and caliber on this non IV contrast seen with extensive vascular calcifications including along the coronary artery calcifications. Trace pericardial effusion. The heart is nonenlarged. There is some enlargement of the central pulmonary artery vessels consistent with pulmonary artery hypertension. Mediastinum/Nodes: Slightly heterogeneous thyroid gland. Previous nodule along the isthmus again suggested. No specific abnormal lymph node enlargement identified on this noncontrast examination in the axillary region, hilum or mediastinum. Lungs/Pleura: Emphysematous lung changes are identified.  No consolidation, pneumothorax or effusion. Pulmonary nodules are again seen. The nodule in the superior segment left lower lobe which previously measured 11 mm, today 14 x 10 mm on series 504, image 53. The left upper lobe nodule which previously measured 4 mm in AP diameter, today on series 504, image 27 measuring 6 mm. Stable tiny right apical nodule measuring 2 mm on image 35 of series 504. Subpleural right lower lobe nodule which measured 6 mm previously, today is stable at 6 mm on image 110, series 504. Stable tiny nodule medial left lower lobe on image 126. Small left upper lobe nodule on image 41 is slightly larger today measuring 4 mm. Musculoskeletal: Scattered degenerative changes are seen along the spine. CT ABDOMEN PELVIS FINDINGS Hepatobiliary: Small benign-appearing hepatic cysts. No new space-occupying liver lesion on this noncontrast examination. The gallbladder is nondilated. Right hepatic lobe punctate calcification, granuloma. Pancreas: The tiny cystic foci along the pancreas are less appreciated on this noncontrast examination in the midbody. The focus seen along the uncinate process that measured 7 mm previously, today measures 8 mm. Spleen: Normal in size without focal abnormality. Adrenals/Urinary Tract: Diffuse thickening of the adrenal glands is  stable. Surgical changes from left nephrectomy. No soft tissue mass in the surgical bed. No abnormal calcifications seen in the right kidney nor along the course of the right ureter. Few tiny renal cystic foci on the prior less well appreciated on this noncontrast examination. Contracted urinary bladder. Stomach/Bowel: Diffuse scattered colonic stool, moderate. The large bowel is nondilated. The small bowel is nondilated. There are loops small bowel extending lateral to the descending colon, atypical distribution of bowel and could be postsurgical. Scattered debris and fluid in the stomach. Vascular/Lymphatic: Moderate diffuse vascular calcifications clues the aorta, iliac vessels and mesenteric vessels. Once again there are areas of stenosis suggested along the origin of the SMA, celiac arteries. Scattered small retroperitoneal nodes are identified, less than 10 mm in short axis. Extent and distribution is similar to previous. Reproductive: Status post hysterectomy. No adnexal masses. Other: Evaluation for solid organ pathology including metastatic disease is limited by the lack of IV contrast. Musculoskeletal: Streak artifact related to patient's spinal fixation hardware at L5-S1 as well as from the left hip arthroplasty. This obscures surrounding bony and soft tissue structures including the left hemipelvis. Multifocal areas of degenerative changes along the spine and pelvis. Multifocal areas of sclerosis along the lumbar spine is stable and again felt to be degenerative in nature. Laminectomy at L5. IMPRESSION: 1. Multiple lung nodules are again seen. Many of which are slightly increased in size from previous. The number of nodules appears similar. 2. Status post left nephrectomy. No new lymph node enlargement or mass lesion otherwise. 3. Small pancreatic cystic foci are again suggested and appear relatively similar when adjusting for technique with lack of IV contrast. Simple attention on follow-up. Aortic  Atherosclerosis (ICD10-I70.0). Electronically Signed   By: Jill Side M.D.   On: 08/20/2022 20:53   CT Abdomen Pelvis Wo Contrast  Result Date: 08/20/2022 CLINICAL DATA:  Metastatic renal cell carcinoma with pulmonary metastases. * Tracking Code: BO * EXAM: CT CHEST, ABDOMEN AND PELVIS WITHOUT CONTRAST TECHNIQUE: Multidetector CT imaging of the chest, abdomen and pelvis was performed following the standard protocol without IV contrast. RADIATION DOSE REDUCTION: This exam was performed according to the departmental dose-optimization program which includes automated exposure control, adjustment of the mA and/or kV according to patient size and/or use of iterative reconstruction technique. COMPARISON:  CT  contrast 05/08/2022 and older FINDINGS: CT CHEST FINDINGS Cardiovascular: Right upper chest port. The thoracic aorta overall has a normal course and caliber on this non IV contrast seen with extensive vascular calcifications including along the coronary artery calcifications. Trace pericardial effusion. The heart is nonenlarged. There is some enlargement of the central pulmonary artery vessels consistent with pulmonary artery hypertension. Mediastinum/Nodes: Slightly heterogeneous thyroid gland. Previous nodule along the isthmus again suggested. No specific abnormal lymph node enlargement identified on this noncontrast examination in the axillary region, hilum or mediastinum. Lungs/Pleura: Emphysematous lung changes are identified. No consolidation, pneumothorax or effusion. Pulmonary nodules are again seen. The nodule in the superior segment left lower lobe which previously measured 11 mm, today 14 x 10 mm on series 504, image 53. The left upper lobe nodule which previously measured 4 mm in AP diameter, today on series 504, image 27 measuring 6 mm. Stable tiny right apical nodule measuring 2 mm on image 35 of series 504. Subpleural right lower lobe nodule which measured 6 mm previously, today is stable at 6 mm  on image 110, series 504. Stable tiny nodule medial left lower lobe on image 126. Small left upper lobe nodule on image 41 is slightly larger today measuring 4 mm. Musculoskeletal: Scattered degenerative changes are seen along the spine. CT ABDOMEN PELVIS FINDINGS Hepatobiliary: Small benign-appearing hepatic cysts. No new space-occupying liver lesion on this noncontrast examination. The gallbladder is nondilated. Right hepatic lobe punctate calcification, granuloma. Pancreas: The tiny cystic foci along the pancreas are less appreciated on this noncontrast examination in the midbody. The focus seen along the uncinate process that measured 7 mm previously, today measures 8 mm. Spleen: Normal in size without focal abnormality. Adrenals/Urinary Tract: Diffuse thickening of the adrenal glands is stable. Surgical changes from left nephrectomy. No soft tissue mass in the surgical bed. No abnormal calcifications seen in the right kidney nor along the course of the right ureter. Few tiny renal cystic foci on the prior less well appreciated on this noncontrast examination. Contracted urinary bladder. Stomach/Bowel: Diffuse scattered colonic stool, moderate. The large bowel is nondilated. The small bowel is nondilated. There are loops small bowel extending lateral to the descending colon, atypical distribution of bowel and could be postsurgical. Scattered debris and fluid in the stomach. Vascular/Lymphatic: Moderate diffuse vascular calcifications clues the aorta, iliac vessels and mesenteric vessels. Once again there are areas of stenosis suggested along the origin of the SMA, celiac arteries. Scattered small retroperitoneal nodes are identified, less than 10 mm in short axis. Extent and distribution is similar to previous. Reproductive: Status post hysterectomy. No adnexal masses. Other: Evaluation for solid organ pathology including metastatic disease is limited by the lack of IV contrast. Musculoskeletal: Streak artifact  related to patient's spinal fixation hardware at L5-S1 as well as from the left hip arthroplasty. This obscures surrounding bony and soft tissue structures including the left hemipelvis. Multifocal areas of degenerative changes along the spine and pelvis. Multifocal areas of sclerosis along the lumbar spine is stable and again felt to be degenerative in nature. Laminectomy at L5. IMPRESSION: 1. Multiple lung nodules are again seen. Many of which are slightly increased in size from previous. The number of nodules appears similar. 2. Status post left nephrectomy. No new lymph node enlargement or mass lesion otherwise. 3. Small pancreatic cystic foci are again suggested and appear relatively similar when adjusting for technique with lack of IV contrast. Simple attention on follow-up. Aortic Atherosclerosis (ICD10-I70.0). Electronically Signed   By: Roosevelt Locks  Lyndel Safe M.D.   On: 08/20/2022 20:53    ASSESSMENT AND PLAN: This is a very pleasant 72 years old African-American female with Metastatic urothelial carcinoma that was initially diagnosed as stage II (T2, N0, M0) in September 2022 status post right nephroureterectomy as well as intravesical treatment and resection of superficial tumor in the bladder several times and March 2023 as well as July 2023.  The patient was found to have enlarging and new pulmonary nodules consistent with metastatic disease in November 2023.  I will send her tissue biopsy for molecular studies by foundation 1 as well as PD-L1 expression. The patient is currently undergoing systemic chemotherapy with carboplatin for AUC of 5 on day 1 and gemcitabine 1000 mg/M2 on days 1 and 8 every 3 weeks.  First dose June 21, 2022.  Status post 3 cycles.  The patient has been tolerating this treatment well with no concerning adverse effect except for the fatigue from the chemotherapy-induced anemia. She had repeat CT scan of the chest, abdomen and pelvis performed recently.  I personally and  independently reviewed the scan images and discussed the results with the patient today. Her scan showed multiple lung nodules which slightly increased in size from the previous scan. I recommended for the patient to continue her current treatment with the same regimen for now.  After completion of cycle # 6, I may consider her for treatment with immunotherapy. For the chemotherapy-induced anemia, she received PRBCs transfusion recently. The patient will come back for follow-up visit in 3 weeks for evaluation before starting cycle #5. She was advised to call immediately if she has any other concerning symptoms in the interval.  The patient voices understanding of current disease status and treatment options and is in agreement with the current care plan.  All questions were answered. The patient knows to call the clinic with any problems, questions or concerns. We can certainly see the patient much sooner if necessary.  The total time spent in the appointment was 30 minutes.  Disclaimer: This note was dictated with voice recognition software. Similar sounding words can inadvertently be transcribed and may not be corrected upon review.

## 2022-08-23 NOTE — Progress Notes (Signed)
Patient seen by MD today  Vitals are within treatment parameters.  Labs reviewed: and are within treatment parameters.  Per physician team, patient is ready for treatment and there are NO modifications to the treatment plan.

## 2022-08-24 ENCOUNTER — Other Ambulatory Visit: Payer: Self-pay

## 2022-08-25 ENCOUNTER — Other Ambulatory Visit: Payer: Self-pay | Admitting: Cardiology

## 2022-08-30 ENCOUNTER — Inpatient Hospital Stay: Payer: Medicare Other | Attending: Internal Medicine

## 2022-08-30 ENCOUNTER — Inpatient Hospital Stay: Payer: Medicare Other

## 2022-08-30 VITALS — BP 111/64 | HR 84 | Temp 98.7°F | Resp 18 | Wt 184.8 lb

## 2022-08-30 DIAGNOSIS — C7910 Secondary malignant neoplasm of unspecified urinary organs: Secondary | ICD-10-CM

## 2022-08-30 DIAGNOSIS — R5383 Other fatigue: Secondary | ICD-10-CM | POA: Insufficient documentation

## 2022-08-30 DIAGNOSIS — Z95828 Presence of other vascular implants and grafts: Secondary | ICD-10-CM

## 2022-08-30 DIAGNOSIS — I252 Old myocardial infarction: Secondary | ICD-10-CM | POA: Diagnosis not present

## 2022-08-30 DIAGNOSIS — K862 Cyst of pancreas: Secondary | ICD-10-CM | POA: Diagnosis not present

## 2022-08-30 DIAGNOSIS — R0609 Other forms of dyspnea: Secondary | ICD-10-CM | POA: Diagnosis not present

## 2022-08-30 DIAGNOSIS — M549 Dorsalgia, unspecified: Secondary | ICD-10-CM | POA: Diagnosis not present

## 2022-08-30 DIAGNOSIS — C679 Malignant neoplasm of bladder, unspecified: Secondary | ICD-10-CM | POA: Insufficient documentation

## 2022-08-30 DIAGNOSIS — Z881 Allergy status to other antibiotic agents status: Secondary | ICD-10-CM | POA: Diagnosis not present

## 2022-08-30 DIAGNOSIS — G4730 Sleep apnea, unspecified: Secondary | ICD-10-CM | POA: Insufficient documentation

## 2022-08-30 DIAGNOSIS — K5900 Constipation, unspecified: Secondary | ICD-10-CM | POA: Insufficient documentation

## 2022-08-30 DIAGNOSIS — Z79631 Long term (current) use of antimetabolite agent: Secondary | ICD-10-CM | POA: Diagnosis not present

## 2022-08-30 DIAGNOSIS — Z905 Acquired absence of kidney: Secondary | ICD-10-CM | POA: Insufficient documentation

## 2022-08-30 DIAGNOSIS — C7800 Secondary malignant neoplasm of unspecified lung: Secondary | ICD-10-CM | POA: Diagnosis not present

## 2022-08-30 DIAGNOSIS — D649 Anemia, unspecified: Secondary | ICD-10-CM | POA: Diagnosis not present

## 2022-08-30 DIAGNOSIS — K7689 Other specified diseases of liver: Secondary | ICD-10-CM | POA: Diagnosis not present

## 2022-08-30 DIAGNOSIS — R61 Generalized hyperhidrosis: Secondary | ICD-10-CM | POA: Diagnosis not present

## 2022-08-30 DIAGNOSIS — F419 Anxiety disorder, unspecified: Secondary | ICD-10-CM | POA: Insufficient documentation

## 2022-08-30 DIAGNOSIS — D6481 Anemia due to antineoplastic chemotherapy: Secondary | ICD-10-CM

## 2022-08-30 DIAGNOSIS — Z5111 Encounter for antineoplastic chemotherapy: Secondary | ICD-10-CM | POA: Insufficient documentation

## 2022-08-30 DIAGNOSIS — Z79630 Long term (current) use of alkylating agent: Secondary | ICD-10-CM | POA: Insufficient documentation

## 2022-08-30 DIAGNOSIS — Z79899 Other long term (current) drug therapy: Secondary | ICD-10-CM | POA: Insufficient documentation

## 2022-08-30 DIAGNOSIS — Z7902 Long term (current) use of antithrombotics/antiplatelets: Secondary | ICD-10-CM | POA: Diagnosis not present

## 2022-08-30 DIAGNOSIS — F32A Depression, unspecified: Secondary | ICD-10-CM | POA: Insufficient documentation

## 2022-08-30 DIAGNOSIS — G8929 Other chronic pain: Secondary | ICD-10-CM | POA: Diagnosis not present

## 2022-08-30 DIAGNOSIS — Z885 Allergy status to narcotic agent status: Secondary | ICD-10-CM | POA: Insufficient documentation

## 2022-08-30 LAB — CMP (CANCER CENTER ONLY)
ALT: 12 U/L (ref 0–44)
AST: 16 U/L (ref 15–41)
Albumin: 3.9 g/dL (ref 3.5–5.0)
Alkaline Phosphatase: 128 U/L — ABNORMAL HIGH (ref 38–126)
Anion gap: 7 (ref 5–15)
BUN: 19 mg/dL (ref 8–23)
CO2: 26 mmol/L (ref 22–32)
Calcium: 8.9 mg/dL (ref 8.9–10.3)
Chloride: 101 mmol/L (ref 98–111)
Creatinine: 1.35 mg/dL — ABNORMAL HIGH (ref 0.44–1.00)
GFR, Estimated: 41 mL/min — ABNORMAL LOW (ref >60)
Glucose, Bld: 107 mg/dL — ABNORMAL HIGH (ref 70–99)
Potassium: 4.1 mmol/L (ref 3.5–5.1)
Sodium: 134 mmol/L — ABNORMAL LOW (ref 135–145)
Total Bilirubin: 0.4 mg/dL (ref 0.3–1.2)
Total Protein: 6.7 g/dL (ref 6.5–8.1)

## 2022-08-30 LAB — CBC WITH DIFFERENTIAL (CANCER CENTER ONLY)
Abs Immature Granulocytes: 0.06 K/uL (ref 0.00–0.07)
Basophils Absolute: 0 K/uL (ref 0.0–0.1)
Basophils Relative: 0 %
Eosinophils Absolute: 0 K/uL (ref 0.0–0.5)
Eosinophils Relative: 0 %
HCT: 24.7 % — ABNORMAL LOW (ref 36.0–46.0)
Hemoglobin: 8.5 g/dL — ABNORMAL LOW (ref 12.0–15.0)
Immature Granulocytes: 1 %
Lymphocytes Relative: 29 %
Lymphs Abs: 2 K/uL (ref 0.7–4.0)
MCH: 32.6 pg (ref 26.0–34.0)
MCHC: 34.4 g/dL (ref 30.0–36.0)
MCV: 94.6 fL (ref 80.0–100.0)
Monocytes Absolute: 0.7 K/uL (ref 0.1–1.0)
Monocytes Relative: 10 %
Neutro Abs: 4.3 K/uL (ref 1.7–7.7)
Neutrophils Relative %: 60 %
Platelet Count: 213 K/uL (ref 150–400)
RBC: 2.61 MIL/uL — ABNORMAL LOW (ref 3.87–5.11)
RDW: 16.6 % — ABNORMAL HIGH (ref 11.5–15.5)
WBC Count: 7.1 K/uL (ref 4.0–10.5)
nRBC: 0 % (ref 0.0–0.2)

## 2022-08-30 LAB — SAMPLE TO BLOOD BANK

## 2022-08-30 MED ORDER — SODIUM CHLORIDE 0.9% FLUSH
10.0000 mL | Freq: Once | INTRAVENOUS | Status: AC
  Administered 2022-08-30: 10 mL

## 2022-08-30 MED ORDER — SODIUM CHLORIDE 0.9% FLUSH
10.0000 mL | INTRAVENOUS | Status: DC | PRN
  Administered 2022-08-30: 10 mL

## 2022-08-30 MED ORDER — SODIUM CHLORIDE 0.9 % IV SOLN: Freq: Once | INTRAVENOUS | Status: AC

## 2022-08-30 MED ORDER — SODIUM CHLORIDE 0.9 % IV SOLN
1000.0000 mg/m2 | Freq: Once | INTRAVENOUS | Status: AC
  Administered 2022-08-30: 2014 mg via INTRAVENOUS
  Filled 2022-08-30: qty 52.97

## 2022-08-30 MED ORDER — HEPARIN SOD (PORK) LOCK FLUSH 100 UNIT/ML IV SOLN
500.0000 [IU] | Freq: Once | INTRAVENOUS | Status: AC | PRN
  Administered 2022-08-30: 500 [IU]

## 2022-08-30 MED ORDER — PROCHLORPERAZINE MALEATE 10 MG PO TABS
10.0000 mg | ORAL_TABLET | Freq: Once | ORAL | Status: AC
  Administered 2022-08-30: 10 mg via ORAL
  Filled 2022-08-30: qty 1

## 2022-08-30 NOTE — Patient Instructions (Signed)
[facility]  Discharge Instructions: Thank you for choosing [facility] to provide your oncology and hematology care.   If you have a lab appointment with the [facility], please go directly to the [facility] and check in at the registration area.   Wear comfortable clothing and clothing appropriate for easy access to any Portacath or PICC line.   We strive to give you quality time with your provider. You may need to reschedule your appointment if you arrive late (15 or more minutes).  Arriving late affects you and other patients whose appointments are after yours.  Also, if you miss three or more appointments without notifying the office, you may be dismissed from the clinic at the provider's discretion.      For prescription refill requests, have your pharmacy contact our office and allow 72 hours for refills to be completed.    Today you received the following chemotherapy and/or immunotherapy agents: Gemzar      To help prevent nausea and vomiting after your treatment, we encourage you to take your nausea medication as directed.  BELOW ARE SYMPTOMS THAT SHOULD BE REPORTED IMMEDIATELY: *FEVER GREATER THAN 100.4 F (38 C) OR HIGHER *CHILLS OR SWEATING *NAUSEA AND VOMITING THAT IS NOT CONTROLLED WITH YOUR NAUSEA MEDICATION *UNUSUAL SHORTNESS OF BREATH *UNUSUAL BRUISING OR BLEEDING *URINARY PROBLEMS (pain or burning when urinating, or frequent urination) *BOWEL PROBLEMS (unusual diarrhea, constipation, pain near the anus) TENDERNESS IN MOUTH AND THROAT WITH OR WITHOUT PRESENCE OF ULCERS (sore throat, sores in mouth, or a toothache) UNUSUAL RASH, SWELLING OR PAIN  UNUSUAL VAGINAL DISCHARGE OR ITCHING   Items with * indicate a potential emergency and should be followed up as soon as possible or go to the Emergency Department if any problems should occur.  Please show the CHEMOTHERAPY ALERT CARD or IMMUNOTHERAPY ALERT CARD at check-in  to the Emergency Department and triage nurse.  Should you have questions after your visit or need to cancel or reschedule your appointment, please contact [facility]  Dept: [phone]  and follow the prompts.  Office hours are 8:00 a.m. to 4:30 p.m. Monday - Friday. Please note that voicemails left after 4:00 p.m. may not be returned until the following business day.  We are closed weekends and major holidays. You have access to a nurse at all times for urgent questions. Please call the main number to the clinic Dept: [phone] and follow the prompts.   For any non-urgent questions, you may also contact your provider using MyChart. We now offer e-Visits for anyone 40 and older to request care online for non-urgent symptoms. For details visit [email or web address].   Also download the MyChart app! Go to the app store, search "MyChart", open the app, select [facility], and log in with your MyChart username and password.

## 2022-08-30 NOTE — Patient Instructions (Signed)

## 2022-08-31 ENCOUNTER — Inpatient Hospital Stay: Payer: Medicare Other | Admitting: Licensed Clinical Social Worker

## 2022-08-31 ENCOUNTER — Telehealth: Payer: Self-pay

## 2022-08-31 DIAGNOSIS — C7910 Secondary malignant neoplasm of unspecified urinary organs: Secondary | ICD-10-CM

## 2022-08-31 NOTE — Progress Notes (Signed)
[facility] CSW Progress Note  [employer] representative  received a call from pt inquiring if there are any additional grants she may be able to apply for.  Pt states she has applied for and been approved for the [employer] and has utilized the [employer] as well.  CSW inquired if pt had registered w/ [facility].  Pt requested CSW resend link.  Link emailed to pt in addition to the [employer] link which states funds may open up again in April.  CSW to research additional financial support options and will reconnect w/ pt next week to discuss.        Henriette Combs, LCSW

## 2022-08-31 NOTE — Telephone Encounter (Signed)
This nurse received a message from this patient stating that she is having pain in her back.  She would like to know if there is something that can be prescribed for her back pain.  Patient asked if prednisone will help her pain.  Patient request forwarded to provider for recommendation. No further concerns noted at this time.

## 2022-09-01 ENCOUNTER — Inpatient Hospital Stay: Payer: Medicare Other

## 2022-09-01 ENCOUNTER — Other Ambulatory Visit: Payer: Self-pay | Admitting: Cardiology

## 2022-09-01 VITALS — BP 145/55 | HR 80 | Temp 97.8°F | Resp 16

## 2022-09-01 DIAGNOSIS — Z881 Allergy status to other antibiotic agents status: Secondary | ICD-10-CM | POA: Diagnosis not present

## 2022-09-01 DIAGNOSIS — Z79630 Long term (current) use of alkylating agent: Secondary | ICD-10-CM | POA: Diagnosis not present

## 2022-09-01 DIAGNOSIS — Z7902 Long term (current) use of antithrombotics/antiplatelets: Secondary | ICD-10-CM | POA: Diagnosis not present

## 2022-09-01 DIAGNOSIS — R0609 Other forms of dyspnea: Secondary | ICD-10-CM | POA: Diagnosis not present

## 2022-09-01 DIAGNOSIS — K862 Cyst of pancreas: Secondary | ICD-10-CM | POA: Diagnosis not present

## 2022-09-01 DIAGNOSIS — M549 Dorsalgia, unspecified: Secondary | ICD-10-CM | POA: Diagnosis not present

## 2022-09-01 DIAGNOSIS — Z79631 Long term (current) use of antimetabolite agent: Secondary | ICD-10-CM | POA: Diagnosis not present

## 2022-09-01 DIAGNOSIS — R61 Generalized hyperhidrosis: Secondary | ICD-10-CM | POA: Diagnosis not present

## 2022-09-01 DIAGNOSIS — Z885 Allergy status to narcotic agent status: Secondary | ICD-10-CM | POA: Diagnosis not present

## 2022-09-01 DIAGNOSIS — Z79899 Other long term (current) drug therapy: Secondary | ICD-10-CM | POA: Diagnosis not present

## 2022-09-01 DIAGNOSIS — K5900 Constipation, unspecified: Secondary | ICD-10-CM | POA: Diagnosis not present

## 2022-09-01 DIAGNOSIS — C679 Malignant neoplasm of bladder, unspecified: Secondary | ICD-10-CM | POA: Diagnosis not present

## 2022-09-01 DIAGNOSIS — I252 Old myocardial infarction: Secondary | ICD-10-CM | POA: Diagnosis not present

## 2022-09-01 DIAGNOSIS — C7910 Secondary malignant neoplasm of unspecified urinary organs: Secondary | ICD-10-CM

## 2022-09-01 DIAGNOSIS — G8929 Other chronic pain: Secondary | ICD-10-CM | POA: Diagnosis not present

## 2022-09-01 DIAGNOSIS — Z5111 Encounter for antineoplastic chemotherapy: Secondary | ICD-10-CM | POA: Diagnosis not present

## 2022-09-01 DIAGNOSIS — D649 Anemia, unspecified: Secondary | ICD-10-CM | POA: Diagnosis not present

## 2022-09-01 DIAGNOSIS — R5383 Other fatigue: Secondary | ICD-10-CM | POA: Diagnosis not present

## 2022-09-01 DIAGNOSIS — K7689 Other specified diseases of liver: Secondary | ICD-10-CM | POA: Diagnosis not present

## 2022-09-01 DIAGNOSIS — F32A Depression, unspecified: Secondary | ICD-10-CM | POA: Diagnosis not present

## 2022-09-01 DIAGNOSIS — C7800 Secondary malignant neoplasm of unspecified lung: Secondary | ICD-10-CM | POA: Diagnosis not present

## 2022-09-01 DIAGNOSIS — Z905 Acquired absence of kidney: Secondary | ICD-10-CM | POA: Diagnosis not present

## 2022-09-01 DIAGNOSIS — G4730 Sleep apnea, unspecified: Secondary | ICD-10-CM | POA: Diagnosis not present

## 2022-09-01 MED ORDER — PEGFILGRASTIM INJECTION 6 MG/0.6ML ~~LOC~~
6.0000 mg | PREFILLED_SYRINGE | Freq: Once | SUBCUTANEOUS | Status: AC
  Administered 2022-09-01: 6 mg via SUBCUTANEOUS

## 2022-09-03 DIAGNOSIS — M545 Low back pain, unspecified: Secondary | ICD-10-CM | POA: Diagnosis not present

## 2022-09-04 ENCOUNTER — Other Ambulatory Visit: Payer: Self-pay

## 2022-09-04 ENCOUNTER — Telehealth: Payer: Self-pay | Admitting: Medical Oncology

## 2022-09-04 ENCOUNTER — Other Ambulatory Visit: Payer: Self-pay | Admitting: Medical Oncology

## 2022-09-04 ENCOUNTER — Inpatient Hospital Stay: Payer: Medicare Other

## 2022-09-04 DIAGNOSIS — C7800 Secondary malignant neoplasm of unspecified lung: Secondary | ICD-10-CM | POA: Diagnosis not present

## 2022-09-04 DIAGNOSIS — G8929 Other chronic pain: Secondary | ICD-10-CM | POA: Diagnosis not present

## 2022-09-04 DIAGNOSIS — Z79899 Other long term (current) drug therapy: Secondary | ICD-10-CM | POA: Diagnosis not present

## 2022-09-04 DIAGNOSIS — Z79630 Long term (current) use of alkylating agent: Secondary | ICD-10-CM | POA: Diagnosis not present

## 2022-09-04 DIAGNOSIS — Z79631 Long term (current) use of antimetabolite agent: Secondary | ICD-10-CM | POA: Diagnosis not present

## 2022-09-04 DIAGNOSIS — D649 Anemia, unspecified: Secondary | ICD-10-CM | POA: Diagnosis not present

## 2022-09-04 DIAGNOSIS — Z881 Allergy status to other antibiotic agents status: Secondary | ICD-10-CM | POA: Diagnosis not present

## 2022-09-04 DIAGNOSIS — Z905 Acquired absence of kidney: Secondary | ICD-10-CM | POA: Diagnosis not present

## 2022-09-04 DIAGNOSIS — R0609 Other forms of dyspnea: Secondary | ICD-10-CM | POA: Diagnosis not present

## 2022-09-04 DIAGNOSIS — F32A Depression, unspecified: Secondary | ICD-10-CM | POA: Diagnosis not present

## 2022-09-04 DIAGNOSIS — G4730 Sleep apnea, unspecified: Secondary | ICD-10-CM | POA: Diagnosis not present

## 2022-09-04 DIAGNOSIS — R5383 Other fatigue: Secondary | ICD-10-CM | POA: Diagnosis not present

## 2022-09-04 DIAGNOSIS — I252 Old myocardial infarction: Secondary | ICD-10-CM | POA: Diagnosis not present

## 2022-09-04 DIAGNOSIS — M549 Dorsalgia, unspecified: Secondary | ICD-10-CM | POA: Diagnosis not present

## 2022-09-04 DIAGNOSIS — C679 Malignant neoplasm of bladder, unspecified: Secondary | ICD-10-CM | POA: Diagnosis not present

## 2022-09-04 DIAGNOSIS — K5900 Constipation, unspecified: Secondary | ICD-10-CM | POA: Diagnosis not present

## 2022-09-04 DIAGNOSIS — Z885 Allergy status to narcotic agent status: Secondary | ICD-10-CM | POA: Diagnosis not present

## 2022-09-04 DIAGNOSIS — R61 Generalized hyperhidrosis: Secondary | ICD-10-CM | POA: Diagnosis not present

## 2022-09-04 DIAGNOSIS — D6481 Anemia due to antineoplastic chemotherapy: Secondary | ICD-10-CM

## 2022-09-04 DIAGNOSIS — K862 Cyst of pancreas: Secondary | ICD-10-CM | POA: Diagnosis not present

## 2022-09-04 DIAGNOSIS — K7689 Other specified diseases of liver: Secondary | ICD-10-CM | POA: Diagnosis not present

## 2022-09-04 DIAGNOSIS — Z7902 Long term (current) use of antithrombotics/antiplatelets: Secondary | ICD-10-CM | POA: Diagnosis not present

## 2022-09-04 DIAGNOSIS — Z5111 Encounter for antineoplastic chemotherapy: Secondary | ICD-10-CM | POA: Diagnosis not present

## 2022-09-04 LAB — CBC WITH DIFFERENTIAL/PLATELET
Abs Immature Granulocytes: 0.84 10*3/uL — ABNORMAL HIGH (ref 0.00–0.07)
Basophils Absolute: 0.1 10*3/uL (ref 0.0–0.1)
Basophils Relative: 0 %
Eosinophils Absolute: 0 10*3/uL (ref 0.0–0.5)
Eosinophils Relative: 0 %
HCT: 22.3 % — ABNORMAL LOW (ref 36.0–46.0)
Hemoglobin: 7.7 g/dL — ABNORMAL LOW (ref 12.0–15.0)
Immature Granulocytes: 3 %
Lymphocytes Relative: 3 %
Lymphs Abs: 0.9 10*3/uL (ref 0.7–4.0)
MCH: 33 pg (ref 26.0–34.0)
MCHC: 34.5 g/dL (ref 30.0–36.0)
MCV: 95.7 fL (ref 80.0–100.0)
Monocytes Absolute: 1.5 10*3/uL — ABNORMAL HIGH (ref 0.1–1.0)
Monocytes Relative: 5 %
Neutro Abs: 24.7 10*3/uL — ABNORMAL HIGH (ref 1.7–7.7)
Neutrophils Relative %: 89 %
Platelets: 72 10*3/uL — ABNORMAL LOW (ref 150–400)
RBC: 2.33 MIL/uL — ABNORMAL LOW (ref 3.87–5.11)
RDW: 17.1 % — ABNORMAL HIGH (ref 11.5–15.5)
WBC: 28.1 10*3/uL — ABNORMAL HIGH (ref 4.0–10.5)
nRBC: 0 % (ref 0.0–0.2)

## 2022-09-04 LAB — SAMPLE TO BLOOD BANK

## 2022-09-04 NOTE — Progress Notes (Signed)
Patient came to the clinic today and spoke with this nurse and stated that she has no energy, she has been having nausea but the Compazine is helping with that.  But she states "I just feel wiped out, I couldn't even drive myself around today".  This nurse spoke with provider who ordered CBC and sample to blood bank.  This nurse advised patient that once the results are in she will be called with further recommendations from the provider.  Patient acknowledged understanding.

## 2022-09-04 NOTE — Telephone Encounter (Signed)
No answer

## 2022-09-04 NOTE — Telephone Encounter (Signed)
Spoke to husband to have pt call.

## 2022-09-05 ENCOUNTER — Telehealth: Payer: Self-pay

## 2022-09-05 ENCOUNTER — Other Ambulatory Visit: Payer: Self-pay

## 2022-09-05 ENCOUNTER — Inpatient Hospital Stay: Payer: Medicare Other

## 2022-09-05 VITALS — BP 121/57 | HR 60 | Temp 98.1°F | Resp 16

## 2022-09-05 DIAGNOSIS — Z881 Allergy status to other antibiotic agents status: Secondary | ICD-10-CM | POA: Diagnosis not present

## 2022-09-05 DIAGNOSIS — G8929 Other chronic pain: Secondary | ICD-10-CM | POA: Diagnosis not present

## 2022-09-05 DIAGNOSIS — M549 Dorsalgia, unspecified: Secondary | ICD-10-CM | POA: Diagnosis not present

## 2022-09-05 DIAGNOSIS — C7800 Secondary malignant neoplasm of unspecified lung: Secondary | ICD-10-CM | POA: Diagnosis not present

## 2022-09-05 DIAGNOSIS — Z79631 Long term (current) use of antimetabolite agent: Secondary | ICD-10-CM | POA: Diagnosis not present

## 2022-09-05 DIAGNOSIS — Z79630 Long term (current) use of alkylating agent: Secondary | ICD-10-CM | POA: Diagnosis not present

## 2022-09-05 DIAGNOSIS — Z7902 Long term (current) use of antithrombotics/antiplatelets: Secondary | ICD-10-CM | POA: Diagnosis not present

## 2022-09-05 DIAGNOSIS — D649 Anemia, unspecified: Secondary | ICD-10-CM | POA: Diagnosis not present

## 2022-09-05 DIAGNOSIS — R5383 Other fatigue: Secondary | ICD-10-CM | POA: Diagnosis not present

## 2022-09-05 DIAGNOSIS — D6481 Anemia due to antineoplastic chemotherapy: Secondary | ICD-10-CM

## 2022-09-05 DIAGNOSIS — C679 Malignant neoplasm of bladder, unspecified: Secondary | ICD-10-CM | POA: Diagnosis not present

## 2022-09-05 DIAGNOSIS — K7689 Other specified diseases of liver: Secondary | ICD-10-CM | POA: Diagnosis not present

## 2022-09-05 DIAGNOSIS — F32A Depression, unspecified: Secondary | ICD-10-CM | POA: Diagnosis not present

## 2022-09-05 DIAGNOSIS — Z885 Allergy status to narcotic agent status: Secondary | ICD-10-CM | POA: Diagnosis not present

## 2022-09-05 DIAGNOSIS — R0609 Other forms of dyspnea: Secondary | ICD-10-CM | POA: Diagnosis not present

## 2022-09-05 DIAGNOSIS — R531 Weakness: Secondary | ICD-10-CM

## 2022-09-05 DIAGNOSIS — Z5111 Encounter for antineoplastic chemotherapy: Secondary | ICD-10-CM | POA: Diagnosis not present

## 2022-09-05 DIAGNOSIS — M545 Low back pain, unspecified: Secondary | ICD-10-CM

## 2022-09-05 DIAGNOSIS — Z79899 Other long term (current) drug therapy: Secondary | ICD-10-CM | POA: Diagnosis not present

## 2022-09-05 DIAGNOSIS — Z905 Acquired absence of kidney: Secondary | ICD-10-CM | POA: Diagnosis not present

## 2022-09-05 DIAGNOSIS — G4730 Sleep apnea, unspecified: Secondary | ICD-10-CM | POA: Diagnosis not present

## 2022-09-05 DIAGNOSIS — I252 Old myocardial infarction: Secondary | ICD-10-CM | POA: Diagnosis not present

## 2022-09-05 DIAGNOSIS — K862 Cyst of pancreas: Secondary | ICD-10-CM | POA: Diagnosis not present

## 2022-09-05 DIAGNOSIS — K5900 Constipation, unspecified: Secondary | ICD-10-CM | POA: Diagnosis not present

## 2022-09-05 DIAGNOSIS — R61 Generalized hyperhidrosis: Secondary | ICD-10-CM | POA: Diagnosis not present

## 2022-09-05 LAB — PREPARE RBC (CROSSMATCH)

## 2022-09-05 MED ORDER — SODIUM CHLORIDE 0.9% IV SOLUTION
250.0000 mL | Freq: Once | INTRAVENOUS | Status: AC
  Administered 2022-09-05: 250 mL via INTRAVENOUS

## 2022-09-05 MED ORDER — SODIUM CHLORIDE 0.9 % IV SOLN: Freq: Once | INTRAVENOUS | Status: AC

## 2022-09-05 MED ORDER — DIPHENHYDRAMINE HCL 25 MG PO CAPS
25.0000 mg | ORAL_CAPSULE | Freq: Once | ORAL | Status: DC
  Filled 2022-09-05: qty 1

## 2022-09-05 MED ORDER — OXYCODONE HCL 5 MG PO TABS
5.0000 mg | ORAL_TABLET | Freq: Once | ORAL | Status: AC
  Administered 2022-09-05: 5 mg via ORAL
  Filled 2022-09-05: qty 1

## 2022-09-05 MED ORDER — ACETAMINOPHEN 325 MG PO TABS
650.0000 mg | ORAL_TABLET | Freq: Once | ORAL | Status: DC
  Filled 2022-09-05: qty 2

## 2022-09-05 MED ORDER — HEPARIN SOD (PORK) LOCK FLUSH 100 UNIT/ML IV SOLN
250.0000 [IU] | INTRAVENOUS | Status: AC | PRN
  Administered 2022-09-05: 500 [IU]

## 2022-09-05 MED ORDER — SODIUM CHLORIDE 0.9% FLUSH
10.0000 mL | INTRAVENOUS | Status: AC | PRN
  Administered 2022-09-05: 10 mL

## 2022-09-05 NOTE — Progress Notes (Signed)
Patient states she took tylenol and Claritin this morning prior to arrival. Per Cassie, PA, ok to skip blood transfusion premeds this morning.  Per Cassie, PA, ok to run 1/2 liter of normal saline with blood transfusion.

## 2022-09-05 NOTE — Patient Instructions (Signed)

## 2022-09-05 NOTE — Telephone Encounter (Signed)
This nurse reached out to patient and made her aware that her HGB is 7.7 and the provider would like her to get one unit of blood today.  Offered appointment time of 10 am and patient is in agreement.  This nurse verified that patient is still wearing her blue arm band. No further questions or concerns noted at this time.

## 2022-09-06 LAB — TYPE AND SCREEN
ABO/RH(D): B NEG
Antibody Screen: NEGATIVE
Unit division: 0

## 2022-09-06 LAB — BPAM RBC
Blood Product Expiration Date: 202403312359
ISSUE DATE / TIME: 202403131041
Unit Type and Rh: 1700

## 2022-09-10 NOTE — Progress Notes (Unsigned)
[place] [facility] OFFICE PROGRESS NOTE  Morgan Torres, [address]  [id number]  DIAGNOSIS: Metastatic urothelial carcinoma that was initially diagnosed as stage II (T2, N0, M0) in September 2022 status post right nephroureterectomy as well as intravesical treatment and resection of superficial tumor in the bladder several times and March 2023 as well as July 2023.  The patient was found to have enlarging and new pulmonary nodules consistent with metastatic disease in November 2023.   PRIOR THERAPY: Right nephroureterectomy as well as intravesical treatment and resection of superficial tumor in the bladder several times and March 2023 as well as July 2023.   CURRENT THERAPY: Palliative systemic chemotherapy with carboplatin for AUC of 5 on day 1 and gemcitabine 1000 mg/M2 on days 1 and 8 every 3 weeks. First cycle June 21, 2022. Status post 4 cycles.    INTERVAL HISTORY: Morgan Torres 72 y.o. female returns to the clinic today for a follow-up visit.  The patient was last seen by Dr. Julien Nordmann on 08/23/2022. The patient is currently undergoing systemic chemotherapy with gemcitabine and carboplatin.  The patient has been experiencing anemia requiring blood transfusions in the interval, her most recent 09/05/2022.  She is following closely with [employer] for depression and anxiety. She had been talking to them about if she qualifies for any grants.  She is followed by [facility] for degenerative disc disease.  She states they are considering a nerve block type injection in her back and she is wondering if that is okay.  She is taking Elavil for pain without adequate relief.  She denies any fever, chills, or unexplained weight loss.  She has night sweats frequently which is not unusual for her.  Her appetite is up-and-down.  Denies any chest pain, cough, or hemoptysis.  She mentions she is getting a little more winded when she exerts herself along with fatigue.  Denies any  nausea, vomiting, or diarrhea.  She will take MiraLAX or smooth move tea for constipation.  Denies any blood in the urine. She is here today for evaluation repeat blood work before undergoing cycle #5.    MEDICAL HISTORY: Past Medical History:  Diagnosis Date   Anxiety    Arthritis    hip, lumbar spine    Asthma    seasonal allergies    Bronchitis    Hx: of   Chronic kidney disease    COPD (chronic obstructive pulmonary disease) (HCC)    Coronary artery disease    Depression    Fibromyalgia    Hypertension    Lumbar herniated disc    Myocardial infarction ([place]) 06/25/2012   followed by Dr. Debara Pickett, treated medically, no stents   Peripheral arterial disease ([place])    of right foot   Pneumonia    Hx: of yrs ago   Sciatica    Sleep apnea    Small bowel obstruction ([place])    several yrs ago    ALLERGIES:  is allergic to diclofenac, codeine, hydrocodone, and ciprofloxacin.  MEDICATIONS:  Current Outpatient Medications  Medication Sig Dispense Refill   acetaminophen (TYLENOL) 500 MG tablet Take 2 tablets (1,000 mg total) by mouth every 6 (six) hours as needed for mild pain. 30 tablet 0   albuterol (PROVENTIL HFA;VENTOLIN HFA) 108 (90 Base) MCG/ACT inhaler Inhale 2 puffs into the lungs every 6 (six) hours as needed for wheezing or shortness of breath. 1 Inhaler 0   amLODipine (NORVASC) 5 MG tablet TAKE 1 TABLET  BY MOUTH EVERY DAY 90 tablet 0   aspirin EC 81 MG tablet Take 81 mg by mouth daily. Swallow whole.     atorvastatin (LIPITOR) 20 MG tablet Take 20 mg by mouth. Monday wed and Friday in am     buPROPion (WELLBUTRIN SR) 150 MG 12 hr tablet Take 150 mg by mouth 2 (two) times daily.     cilostazol (PLETAL) 100 MG tablet Take 100 mg by mouth 2 (two) times daily.     EPINEPHrine (EPIPEN 2-PAK) 0.3 mg/0.3 mL DEVI Inject 0.3 mLs (0.3 mg total) into the muscle once as needed (for severe allergic reaction). CAll 911 immediately if you have to use this medicine 1 Device 1    lidocaine-prilocaine (EMLA) cream Apply to the Port-A-Cath site 30-60-minute before treatment 30 g 2   LORazepam (ATIVAN) 0.5 MG tablet Take 0.5 mg by mouth daily as needed for anxiety.     losartan-hydrochlorothiazide (HYZAAR) 100-12.5 MG tablet Take 1 tablet by mouth daily.     methylPREDNISolone (MEDROL DOSEPAK) 4 MG TBPK tablet Take 4 mg by mouth as directed.     metoprolol tartrate (LOPRESSOR) 25 MG tablet Take 1 tablet (25 mg total) by mouth 2 (two) times daily. Call and schedule follow up appt to receive further refills. 2nd attempt. [phone] 30 tablet 0   montelukast (SINGULAIR) 10 MG tablet Take 10 mg by mouth at bedtime.     mupirocin ointment (BACTROBAN) 2 % Apply 1 Application topically 2 (two) times daily. 22 g 0   nicotine (NICODERM CQ - DOSED IN MG/24 HOURS) 21 mg/24hr patch Place 21 mg onto the skin daily.     nicotine polacrilex (NICORETTE) 4 MG gum Take 4 mg by mouth as needed for smoking cessation.     nitroGLYCERIN (NITROSTAT) 0.4 MG SL tablet Place 1 tablet (0.4 mg total) under the tongue every 5 (five) minutes x 3 doses as needed for chest pain. 25 tablet 2   oxybutynin (DITROPAN) 5 MG tablet Take 1 tablet (5 mg total) by mouth every 8 (eight) hours as needed for bladder spasms. 30 tablet 1   polyethylene glycol (MIRALAX / GLYCOLAX) 17 g packet Take 17 g by mouth as needed.     prochlorperazine (COMPAZINE) 10 MG tablet Take 1 tablet (10 mg total) by mouth every 6 (six) hours as needed for nausea or vomiting. 30 tablet 0   traMADol (ULTRAM) 50 MG tablet Take by mouth every 6 (six) hours as needed. Not taking     No current facility-administered medications for this visit.    SURGICAL HISTORY:  Past Surgical History:  Procedure Laterality Date   ABDOMINAL SURGERY     resection of small intestine   ANTERIOR CRUCIATE LIGAMENT REPAIR Bilateral    BACK SURGERY     fusion of lumbar   CARDIAC CATHETERIZATION  06/25/2012   COLON SURGERY     resection for bowel - for  obstruction  6 to 7 yrs ago per pt on 01-05-2022   COLONOSCOPY     Hx; of   DILATION AND CURETTAGE OF UTERUS     EYE SURGERY Bilateral    cataracts removed   HERNIA REPAIR  02/23/1969   inguinal hernia    IR IMAGING GUIDED PORT INSERTION  06/07/2022   LEFT HEART CATHETERIZATION WITH CORONARY ANGIOGRAM N/A 03/30/2013   Procedure: LEFT HEART CATHETERIZATION WITH CORONARY ANGIOGRAM;  Surgeon: Leonie Man, MD;  Location: [facility] CATH LAB;  Service: Cardiovascular;  Laterality: N/A;   left thumb  surgery     yrs ago   ROBOT ASSITED LAPAROSCOPIC NEPHROURETERECTOMY Left 03/09/2021   Procedure: XI ROBOT ASSITED LAPAROSCOPIC NEPHROURETERECTOMY/ CYSTOSCOPY WITH LEFT URETEROSCOPY WITH TRANSURETHRAL RESECTION OF URETERAL ORIFICE;  Surgeon: Ceasar Mons, MD;  Location: [facility] ORS;  Service: Urology;  Laterality: Left;   SALIVARY GLAND SURGERY Left    approached from inside mouth & side of neck 1970-1980   TONSILLECTOMY     as child   TOTAL HIP ARTHROPLASTY Left 05/28/2014   Procedure: LEFT TOTAL HIP ARTHROPLASTY;  Surgeon: Yvette Rack., MD;  Location: [facility];  Service: Orthopedics;  Laterality: Left;   TRANSURETHRAL RESECTION OF BLADDER TUMOR N/A 08/23/2021   Procedure: TRANSURETHRAL RESECTION OF BLADDER TUMOR (TURBT) WITH CYSTOSCOPY/ POSTOPERATIVE INSTILLATION OF GEMCITABINE/retrograde pyelogram and stent placement (right);  Surgeon: Ceasar Mons, MD;  Location: [facility];  Service: Urology;  Laterality: N/A;   TRANSURETHRAL RESECTION OF BLADDER TUMOR N/A 01/10/2022   Procedure: TRANSURETHRAL RESECTION OF BLADDER TUMOR (TURBT) WITH CYSTOSCOPY / GEMCITABINE INSTILLATION post-operatively;  Surgeon: Ceasar Mons, MD;  Location: [facility];  Service: Urology;  Laterality: N/A;  ONLY NEEDS 30 MIN    REVIEW OF SYSTEMS:   Constitutional: Positive for fatigue. Negative for appetite change, chills, fever and unexpected weight change.  HENT:  Negative for mouth sores, nosebleeds, sore throat and trouble swallowing.   Eyes: Negative for eye problems and icterus.  Respiratory: Positive for mild dyspnea on exertion.  Negative for cough, hemoptysis, and wheezing.   Cardiovascular: Negative for chest pain and leg swelling.  Gastrointestinal: Positive for occasional constipation. Negative for abdominal pain,  diarrhea, nausea and vomiting.  Genitourinary: Negative for bladder incontinence, difficulty urinating, dysuria, frequency and hematuria.   Musculoskeletal: Negative for back pain, gait problem, neck pain and neck stiffness.  Skin: Negative for itching and rash.  Neurological: Negative for dizziness, extremity weakness, gait problem, headaches, light-headedness and seizures.  Hematological: Negative for adenopathy. Does not bruise/bleed easily.  Psychiatric/Behavioral: Negative for confusion, depression and sleep disturbance. The patient is not nervous/anxious.      PHYSICAL EXAMINATION:  Blood pressure 136/64, pulse 80, temperature 97.7 F (36.5 C), temperature source Temporal, resp. rate 15, height 5\' 8"  (1.727 m), weight 184 lb 3.2 oz (83.6 kg), SpO2 98 %.  ECOG PERFORMANCE STATUS: 1  Physical Exam  Constitutional: Oriented to person, place, and time and well-developed, well-nourished, and in no distress.  HENT:  Head: Normocephalic and atraumatic.  Mouth/Throat: Oropharynx is clear and moist. No oropharyngeal exudate.  Eyes: Conjunctivae are normal. Right eye exhibits no discharge. Left eye exhibits no discharge. No scleral icterus.  Neck: Normal range of motion. Neck supple.  Cardiovascular: Normal rate, regular rhythm, normal heart sounds and intact distal pulses.   Pulmonary/Chest: Effort normal and breath sounds normal. No respiratory distress. No wheezes. No rales.  Abdominal: Soft. Bowel sounds are normal. Exhibits no distension and no mass. There is no tenderness.  Musculoskeletal: Normal range of motion. Exhibits  no edema.  Lymphadenopathy:    No cervical adenopathy.  Neurological: Alert and oriented to person, place, and time. Exhibits normal muscle tone. Gait normal. Coordination normal.  Skin: Skin is warm and dry. No rash noted. Not diaphoretic. No erythema. No pallor.  Psychiatric: Mood, memory and judgment normal.  Vitals reviewed.  LABORATORY DATA: Lab Results  Component Value Date   WBC 20.0 (H) 09/13/2022   HGB 8.9 (L) 09/13/2022   HCT 26.3 (L) 09/13/2022   MCV 97.8 09/13/2022  PLT 90 (L) 09/13/2022      Chemistry      Component Value Date/Time   NA 137 09/13/2022 0910   K 3.8 09/13/2022 0910   CL 104 09/13/2022 0910   CO2 25 09/13/2022 0910   BUN 15 09/13/2022 0910   CREATININE 1.33 (H) 09/13/2022 0910      Component Value Date/Time   CALCIUM 8.7 (L) 09/13/2022 0910   ALKPHOS 157 (H) 09/13/2022 0910   AST 13 (L) 09/13/2022 0910   ALT 10 09/13/2022 0910   BILITOT 0.2 (L) 09/13/2022 0910       RADIOGRAPHIC STUDIES:  CT Chest Wo Contrast  Result Date: 08/20/2022 CLINICAL DATA:  Metastatic renal cell carcinoma with pulmonary metastases. * Tracking Code: BO * EXAM: CT CHEST, ABDOMEN AND PELVIS WITHOUT CONTRAST TECHNIQUE: Multidetector CT imaging of the chest, abdomen and pelvis was performed following the standard protocol without IV contrast. RADIATION DOSE REDUCTION: This exam was performed according to the departmental dose-optimization program which includes automated exposure control, adjustment of the mA and/or kV according to patient size and/or use of iterative reconstruction technique. COMPARISON:  CT contrast 05/08/2022 and older FINDINGS: CT CHEST FINDINGS Cardiovascular: Right upper chest port. The thoracic aorta overall has a normal course and caliber on this non IV contrast seen with extensive vascular calcifications including along the coronary artery calcifications. Trace pericardial effusion. The heart is nonenlarged. There is some enlargement of the central  pulmonary artery vessels consistent with pulmonary artery hypertension. Mediastinum/Nodes: Slightly heterogeneous thyroid gland. Previous nodule along the isthmus again suggested. No specific abnormal lymph node enlargement identified on this noncontrast examination in the axillary region, hilum or mediastinum. Lungs/Pleura: Emphysematous lung changes are identified. No consolidation, pneumothorax or effusion. Pulmonary nodules are again seen. The nodule in the superior segment left lower lobe which previously measured 11 mm, today 14 x 10 mm on series 504, image 53. The left upper lobe nodule which previously measured 4 mm in AP diameter, today on series 504, image 27 measuring 6 mm. Stable tiny right apical nodule measuring 2 mm on image 35 of series 504. Subpleural right lower lobe nodule which measured 6 mm previously, today is stable at 6 mm on image 110, series 504. Stable tiny nodule medial left lower lobe on image 126. Small left upper lobe nodule on image 41 is slightly larger today measuring 4 mm. Musculoskeletal: Scattered degenerative changes are seen along the spine. CT ABDOMEN PELVIS FINDINGS Hepatobiliary: Small benign-appearing hepatic cysts. No new space-occupying liver lesion on this noncontrast examination. The gallbladder is nondilated. Right hepatic lobe punctate calcification, granuloma. Pancreas: The tiny cystic foci along the pancreas are less appreciated on this noncontrast examination in the midbody. The focus seen along the uncinate process that measured 7 mm previously, today measures 8 mm. Spleen: Normal in size without focal abnormality. Adrenals/Urinary Tract: Diffuse thickening of the adrenal glands is stable. Surgical changes from left nephrectomy. No soft tissue mass in the surgical bed. No abnormal calcifications seen in the right kidney nor along the course of the right ureter. Few tiny renal cystic foci on the prior less well appreciated on this noncontrast examination.  Contracted urinary bladder. Stomach/Bowel: Diffuse scattered colonic stool, moderate. The large bowel is nondilated. The small bowel is nondilated. There are loops small bowel extending lateral to the descending colon, atypical distribution of bowel and could be postsurgical. Scattered debris and fluid in the stomach. Vascular/Lymphatic: Moderate diffuse vascular calcifications clues the aorta, iliac vessels and mesenteric vessels. Once again  there are areas of stenosis suggested along the origin of the SMA, celiac arteries. Scattered small retroperitoneal nodes are identified, less than 10 mm in short axis. Extent and distribution is similar to previous. Reproductive: Status post hysterectomy. No adnexal masses. Other: Evaluation for solid organ pathology including metastatic disease is limited by the lack of IV contrast. Musculoskeletal: Streak artifact related to patient's spinal fixation hardware at L5-S1 as well as from the left hip arthroplasty. This obscures surrounding bony and soft tissue structures including the left hemipelvis. Multifocal areas of degenerative changes along the spine and pelvis. Multifocal areas of sclerosis along the lumbar spine is stable and again felt to be degenerative in nature. Laminectomy at L5. IMPRESSION: 1. Multiple lung nodules are again seen. Many of which are slightly increased in size from previous. The number of nodules appears similar. 2. Status post left nephrectomy. No new lymph node enlargement or mass lesion otherwise. 3. Small pancreatic cystic foci are again suggested and appear relatively similar when adjusting for technique with lack of IV contrast. Simple attention on follow-up. Aortic Atherosclerosis (ICD10-I70.0). Electronically Signed   By: Jill Side M.D.   On: 08/20/2022 20:53   CT Abdomen Pelvis Wo Contrast  Result Date: 08/20/2022 CLINICAL DATA:  Metastatic renal cell carcinoma with pulmonary metastases. * Tracking Code: BO * EXAM: CT CHEST, ABDOMEN  AND PELVIS WITHOUT CONTRAST TECHNIQUE: Multidetector CT imaging of the chest, abdomen and pelvis was performed following the standard protocol without IV contrast. RADIATION DOSE REDUCTION: This exam was performed according to the departmental dose-optimization program which includes automated exposure control, adjustment of the mA and/or kV according to patient size and/or use of iterative reconstruction technique. COMPARISON:  CT contrast 05/08/2022 and older FINDINGS: CT CHEST FINDINGS Cardiovascular: Right upper chest port. The thoracic aorta overall has a normal course and caliber on this non IV contrast seen with extensive vascular calcifications including along the coronary artery calcifications. Trace pericardial effusion. The heart is nonenlarged. There is some enlargement of the central pulmonary artery vessels consistent with pulmonary artery hypertension. Mediastinum/Nodes: Slightly heterogeneous thyroid gland. Previous nodule along the isthmus again suggested. No specific abnormal lymph node enlargement identified on this noncontrast examination in the axillary region, hilum or mediastinum. Lungs/Pleura: Emphysematous lung changes are identified. No consolidation, pneumothorax or effusion. Pulmonary nodules are again seen. The nodule in the superior segment left lower lobe which previously measured 11 mm, today 14 x 10 mm on series 504, image 53. The left upper lobe nodule which previously measured 4 mm in AP diameter, today on series 504, image 27 measuring 6 mm. Stable tiny right apical nodule measuring 2 mm on image 35 of series 504. Subpleural right lower lobe nodule which measured 6 mm previously, today is stable at 6 mm on image 110, series 504. Stable tiny nodule medial left lower lobe on image 126. Small left upper lobe nodule on image 41 is slightly larger today measuring 4 mm. Musculoskeletal: Scattered degenerative changes are seen along the spine. CT ABDOMEN PELVIS FINDINGS Hepatobiliary:  Small benign-appearing hepatic cysts. No new space-occupying liver lesion on this noncontrast examination. The gallbladder is nondilated. Right hepatic lobe punctate calcification, granuloma. Pancreas: The tiny cystic foci along the pancreas are less appreciated on this noncontrast examination in the midbody. The focus seen along the uncinate process that measured 7 mm previously, today measures 8 mm. Spleen: Normal in size without focal abnormality. Adrenals/Urinary Tract: Diffuse thickening of the adrenal glands is stable. Surgical changes from left nephrectomy. No soft  tissue mass in the surgical bed. No abnormal calcifications seen in the right kidney nor along the course of the right ureter. Few tiny renal cystic foci on the prior less well appreciated on this noncontrast examination. Contracted urinary bladder. Stomach/Bowel: Diffuse scattered colonic stool, moderate. The large bowel is nondilated. The small bowel is nondilated. There are loops small bowel extending lateral to the descending colon, atypical distribution of bowel and could be postsurgical. Scattered debris and fluid in the stomach. Vascular/Lymphatic: Moderate diffuse vascular calcifications clues the aorta, iliac vessels and mesenteric vessels. Once again there are areas of stenosis suggested along the origin of the SMA, celiac arteries. Scattered small retroperitoneal nodes are identified, less than 10 mm in short axis. Extent and distribution is similar to previous. Reproductive: Status post hysterectomy. No adnexal masses. Other: Evaluation for solid organ pathology including metastatic disease is limited by the lack of IV contrast. Musculoskeletal: Streak artifact related to patient's spinal fixation hardware at L5-S1 as well as from the left hip arthroplasty. This obscures surrounding bony and soft tissue structures including the left hemipelvis. Multifocal areas of degenerative changes along the spine and pelvis. Multifocal areas of  sclerosis along the lumbar spine is stable and again felt to be degenerative in nature. Laminectomy at L5. IMPRESSION: 1. Multiple lung nodules are again seen. Many of which are slightly increased in size from previous. The number of nodules appears similar. 2. Status post left nephrectomy. No new lymph node enlargement or mass lesion otherwise. 3. Small pancreatic cystic foci are again suggested and appear relatively similar when adjusting for technique with lack of IV contrast. Simple attention on follow-up. Aortic Atherosclerosis (ICD10-I70.0). Electronically Signed   By: Jill Side M.D.   On: 08/20/2022 20:53     ASSESSMENT/PLAN:  This is a very pleasant 72 year old African-American female with metastatic urothelial carcinoma.  She was initially diagnosed as a stage II (T2, N0, M0) in September 2022.   She is status post right nephroureterectomy as well as  intravesical treatment and resection of superficial tumor in the bladder several times and March 2023 as well as July 2023.  The patient was found to have enlarging and new pulmonary nodules consistent with metastatic disease in November 2023.    Dr. Julien Nordmann sent her tissue for foundation 1 and PD-L1 expression.   The patient is currently undergoing systemic chemotherapy with carboplatin for an AUC of 5 on day 1 and gemcitabine 1000 mg/m on days 1 and 8 IV every 3 weeks with Neulasta support.  Her first dose of treatment was on 06/21/2022.  She is status post 4 cycles of treatment.  She experiences anemia following treatment which often requires blood transfusions, her most recent on 09/05/2022.  The patient had a restaging CT scan at her last appointment with Dr. Julien Nordmann reviewed   At her last appointment, Dr. Julien Nordmann her scan showed mild progression. Dr. Julien Nordmann recommended for 6 cycles. He may consider immunotherapy after cycle #6.   Regarding her fatigue, she does have some worsening anemia which is likely due to the cumulative doses of  chemotherapy. Encouraged her to take a multivitamin/iron supplement.  Will continue to monitor anemia closely and arrange for sample blood bank's to be drawn weekly.  Due to the patient's platelet count of 90,000, the patient does not meet treatment conditions/parameters today.  I will delay her treatment by 1 week.  We will see her back next week for evaluation and repeat blood work before starting cycle #5.  She will  continue to follow with orthopedics for her chronic back pain.  I do not see any contraindication for her to receive injections in her back from our standpoint as long as it is a minimally invasive procedure.  Discussed we would not manage her chronic back pain at the cancer center and she can talk to her orthopedic provider or they can consider sending her to a pain clinic if her back injections are unsuccessful in controlling her pain.  She is wondering if she can take B12 supplements .  Let her know that this is okay from our standpoint.  Encouraged her to take a stool softener in addition to using laxative if needed.   He patient was advised to call immediately if she has any concerning symptoms in the interval. The patient voices understanding of current disease status and treatment options and is in agreement with the current care plan. All questions were answered. The patient knows to call the clinic with any problems, questions or concerns. We can certainly see the patient much sooner if necessary          Orders Placed This Encounter  Procedures   CBC with Differential ([place] Only)    Standing Status:   Future    Standing Expiration Date:   09/20/2023   CMP ([place] only)    Standing Status:   Future    Standing Expiration Date:   09/20/2023     The total time spent in the appointment was 20-29 minutes.   Tawona Filsinger L Lyndsy Gilberto, PA-C 09/13/22

## 2022-09-12 MED FILL — Fosaprepitant Dimeglumine For IV Infusion 150 MG (Base Eq): INTRAVENOUS | Qty: 5 | Status: AC

## 2022-09-12 MED FILL — Dexamethasone Sodium Phosphate Inj 100 MG/10ML: INTRAMUSCULAR | Qty: 1 | Status: AC

## 2022-09-13 ENCOUNTER — Inpatient Hospital Stay: Payer: Medicare Other | Admitting: Physician Assistant

## 2022-09-13 ENCOUNTER — Other Ambulatory Visit: Payer: Self-pay

## 2022-09-13 ENCOUNTER — Inpatient Hospital Stay: Payer: Medicare Other

## 2022-09-13 VITALS — BP 136/64 | HR 80 | Temp 97.7°F | Resp 15 | Ht 68.0 in | Wt 184.2 lb

## 2022-09-13 DIAGNOSIS — D649 Anemia, unspecified: Secondary | ICD-10-CM

## 2022-09-13 DIAGNOSIS — Z79899 Other long term (current) drug therapy: Secondary | ICD-10-CM | POA: Diagnosis not present

## 2022-09-13 DIAGNOSIS — G4730 Sleep apnea, unspecified: Secondary | ICD-10-CM | POA: Diagnosis not present

## 2022-09-13 DIAGNOSIS — Z5111 Encounter for antineoplastic chemotherapy: Secondary | ICD-10-CM | POA: Diagnosis not present

## 2022-09-13 DIAGNOSIS — Z7902 Long term (current) use of antithrombotics/antiplatelets: Secondary | ICD-10-CM | POA: Diagnosis not present

## 2022-09-13 DIAGNOSIS — K862 Cyst of pancreas: Secondary | ICD-10-CM | POA: Diagnosis not present

## 2022-09-13 DIAGNOSIS — G8929 Other chronic pain: Secondary | ICD-10-CM | POA: Diagnosis not present

## 2022-09-13 DIAGNOSIS — C7910 Secondary malignant neoplasm of unspecified urinary organs: Secondary | ICD-10-CM

## 2022-09-13 DIAGNOSIS — Z79631 Long term (current) use of antimetabolite agent: Secondary | ICD-10-CM | POA: Diagnosis not present

## 2022-09-13 DIAGNOSIS — C7800 Secondary malignant neoplasm of unspecified lung: Secondary | ICD-10-CM | POA: Diagnosis not present

## 2022-09-13 DIAGNOSIS — C679 Malignant neoplasm of bladder, unspecified: Secondary | ICD-10-CM | POA: Diagnosis not present

## 2022-09-13 DIAGNOSIS — Z881 Allergy status to other antibiotic agents status: Secondary | ICD-10-CM | POA: Diagnosis not present

## 2022-09-13 DIAGNOSIS — F32A Depression, unspecified: Secondary | ICD-10-CM | POA: Diagnosis not present

## 2022-09-13 DIAGNOSIS — Z79630 Long term (current) use of alkylating agent: Secondary | ICD-10-CM | POA: Diagnosis not present

## 2022-09-13 DIAGNOSIS — K7689 Other specified diseases of liver: Secondary | ICD-10-CM | POA: Diagnosis not present

## 2022-09-13 DIAGNOSIS — Z885 Allergy status to narcotic agent status: Secondary | ICD-10-CM | POA: Diagnosis not present

## 2022-09-13 DIAGNOSIS — R5383 Other fatigue: Secondary | ICD-10-CM | POA: Diagnosis not present

## 2022-09-13 DIAGNOSIS — K5900 Constipation, unspecified: Secondary | ICD-10-CM | POA: Diagnosis not present

## 2022-09-13 DIAGNOSIS — Z95828 Presence of other vascular implants and grafts: Secondary | ICD-10-CM

## 2022-09-13 DIAGNOSIS — R61 Generalized hyperhidrosis: Secondary | ICD-10-CM | POA: Diagnosis not present

## 2022-09-13 DIAGNOSIS — I252 Old myocardial infarction: Secondary | ICD-10-CM | POA: Diagnosis not present

## 2022-09-13 DIAGNOSIS — M549 Dorsalgia, unspecified: Secondary | ICD-10-CM | POA: Diagnosis not present

## 2022-09-13 DIAGNOSIS — Z905 Acquired absence of kidney: Secondary | ICD-10-CM | POA: Diagnosis not present

## 2022-09-13 DIAGNOSIS — R0609 Other forms of dyspnea: Secondary | ICD-10-CM | POA: Diagnosis not present

## 2022-09-13 LAB — CMP (CANCER CENTER ONLY)
ALT: 10 U/L (ref 0–44)
AST: 13 U/L — ABNORMAL LOW (ref 15–41)
Albumin: 3.8 g/dL (ref 3.5–5.0)
Alkaline Phosphatase: 157 U/L — ABNORMAL HIGH (ref 38–126)
Anion gap: 8 (ref 5–15)
BUN: 15 mg/dL (ref 8–23)
CO2: 25 mmol/L (ref 22–32)
Calcium: 8.7 mg/dL — ABNORMAL LOW (ref 8.9–10.3)
Chloride: 104 mmol/L (ref 98–111)
Creatinine: 1.33 mg/dL — ABNORMAL HIGH (ref 0.44–1.00)
GFR, Estimated: 42 mL/min — ABNORMAL LOW (ref >60)
Glucose, Bld: 113 mg/dL — ABNORMAL HIGH (ref 70–99)
Potassium: 3.8 mmol/L (ref 3.5–5.1)
Sodium: 137 mmol/L (ref 135–145)
Total Bilirubin: 0.2 mg/dL — ABNORMAL LOW (ref 0.3–1.2)
Total Protein: 6.4 g/dL — ABNORMAL LOW (ref 6.5–8.1)

## 2022-09-13 LAB — CBC WITH DIFFERENTIAL (CANCER CENTER ONLY)
Abs Immature Granulocytes: 0.47 10*3/uL — ABNORMAL HIGH (ref 0.00–0.07)
Basophils Absolute: 0.1 10*3/uL (ref 0.0–0.1)
Basophils Relative: 0 %
Eosinophils Absolute: 0.1 10*3/uL (ref 0.0–0.5)
Eosinophils Relative: 1 %
HCT: 26.3 % — ABNORMAL LOW (ref 36.0–46.0)
Hemoglobin: 8.9 g/dL — ABNORMAL LOW (ref 12.0–15.0)
Immature Granulocytes: 2 %
Lymphocytes Relative: 12 %
Lymphs Abs: 2.5 10*3/uL (ref 0.7–4.0)
MCH: 33.1 pg (ref 26.0–34.0)
MCHC: 33.8 g/dL (ref 30.0–36.0)
MCV: 97.8 fL (ref 80.0–100.0)
Monocytes Absolute: 2.2 10*3/uL — ABNORMAL HIGH (ref 0.1–1.0)
Monocytes Relative: 11 %
Neutro Abs: 14.6 10*3/uL — ABNORMAL HIGH (ref 1.7–7.7)
Neutrophils Relative %: 74 %
Platelet Count: 90 10*3/uL — ABNORMAL LOW (ref 150–400)
RBC: 2.69 MIL/uL — ABNORMAL LOW (ref 3.87–5.11)
RDW: 19.3 % — ABNORMAL HIGH (ref 11.5–15.5)
WBC Count: 20 10*3/uL — ABNORMAL HIGH (ref 4.0–10.5)
nRBC: 0.2 % (ref 0.0–0.2)

## 2022-09-13 LAB — SAMPLE TO BLOOD BANK

## 2022-09-13 MED ORDER — SODIUM CHLORIDE 0.9% FLUSH
10.0000 mL | Freq: Once | INTRAVENOUS | Status: AC
  Administered 2022-09-13: 10 mL

## 2022-09-13 NOTE — Progress Notes (Signed)
Patient seen by PA today  Vitals are within treatment parameters.  Labs reviewed: and are not all within treatment parameters. Low Platelet count.  PA and MD aware.  Per physician team, patient will not be receiving treatment today.

## 2022-09-18 NOTE — Progress Notes (Unsigned)
[place] OFFICE PROGRESS NOTE  Morgan Torres, Morgan Torres Morgan Torres [id number]  DIAGNOSIS: Metastatic urothelial carcinoma that was initially diagnosed as stage II (T2, N0, M0) in September 2022 status post right nephroureterectomy as well as intravesical treatment and resection of superficial tumor in the bladder several times and March 2023 as well as July 2023.  The patient was found to have enlarging and new pulmonary nodules consistent with metastatic disease in November 2023.   PRIOR THERAPY: Right nephroureterectomy as well as intravesical treatment and resection of superficial tumor in the bladder several times and March 2023 as well as July 2023.   CURRENT THERAPY: Palliative systemic chemotherapy with carboplatin for AUC of 5 on day 1 and gemcitabine 1000 mg/M2 on days 1 and 8 every 3 weeks. First cycle June 21, 2022. Status post 4 cycles. Her dose of carboplatin was reduced to AUC of 4 and gemcitabine to 800 mg/m2 starting from cycle #5 due to cytopenias.  INTERVAL HISTORY: Morgan Torres 72 y.o. female returns to the clinic today for a follow-up visit.  The patient was last seen by myself last week. However, the patient's labs were not parameters to proceed with treatment that day.  Therefore, her treatment was delayed by 1 week.  With getting an extra week off of treatment, she is feeling better than she was. She states yesterday and today is the best she has felt in awhile. The patient experiences some cytopenias with chemotherapy often requiring supportive care such as blood transfusions.  Moving forward, we discussed monitoring her labs closely on a weekly basis and Dr. Julien Nordmann reduced her doses of chemotherapy slightly. She denies any fever, chills, or unexplained weight loss.  Her appetite is up-and-down. She is wondering if she can drink herbal life tea. She had some significant constipation last week from taking iron supplements. She took smooth move  tea and miralax. Denies any chest pain, cough, or hemoptysis.  She denies any nausea, vomiting, diarrhea.   Denies any blood in the urine.  She has some chronic back pain for which she is followed by [facility] for degenerative disc disease.  She is scheduled for a nerve block injection. She is here today for evaluation and repeat blood work before resuming treatment with cycle #5.  MEDICAL HISTORY: Past Medical History:  Diagnosis Date   Anxiety    Arthritis    hip, lumbar spine    Asthma    seasonal allergies    Bronchitis    Hx: of   Chronic kidney disease    COPD (chronic obstructive pulmonary disease) (HCC)    Coronary artery disease    Depression    Fibromyalgia    Hypertension    Lumbar herniated disc    Myocardial infarction ([place]) 06/25/2012   followed by Dr. Debara Pickett, treated medically, no stents   Peripheral arterial disease ([place])    of right foot   Pneumonia    Hx: of yrs ago   Sciatica    Sleep apnea    Small bowel obstruction ([place])    several yrs ago    ALLERGIES:  is allergic to diclofenac, codeine, hydrocodone, and ciprofloxacin.  MEDICATIONS:  Current Outpatient Medications  Medication Sig Dispense Refill   acetaminophen (TYLENOL) 500 MG tablet Take 2 tablets (1,000 mg total) by mouth every 6 (six) hours as needed for mild pain. 30 tablet 0   albuterol (PROVENTIL HFA;VENTOLIN HFA) 108 (90 Base) MCG/ACT inhaler Inhale 2 puffs into  the lungs every 6 (six) hours as needed for wheezing or shortness of breath. 1 Inhaler 0   amLODipine (NORVASC) 5 MG tablet TAKE 1 TABLET BY MOUTH EVERY DAY 90 tablet 0   aspirin EC 81 MG tablet Take 81 mg by mouth daily. Swallow whole.     atorvastatin (LIPITOR) 20 MG tablet Take 20 mg by mouth. Monday wed and Friday in am     buPROPion (WELLBUTRIN SR) 150 MG 12 hr tablet Take 150 mg by mouth 2 (two) times daily.     cilostazol (PLETAL) 100 MG tablet Take 100 mg by mouth 2 (two) times daily.     EPINEPHrine (EPIPEN 2-PAK) 0.3 mg/0.3  mL DEVI Inject 0.3 mLs (0.3 mg total) into the muscle once as needed (for severe allergic reaction). CAll 911 immediately if you have to use this medicine 1 Device 1   lidocaine-prilocaine (EMLA) cream Apply to the Port-A-Cath site 30-60-minute before treatment 30 g 2   LORazepam (ATIVAN) 0.5 MG tablet Take 0.5 mg by mouth daily as needed for anxiety.     losartan-hydrochlorothiazide (HYZAAR) 100-12.5 MG tablet Take 1 tablet by mouth daily.     methylPREDNISolone (MEDROL DOSEPAK) 4 MG TBPK tablet Take 4 mg by mouth as directed.     metoprolol tartrate (LOPRESSOR) 25 MG tablet Take 1 tablet (25 mg total) by mouth 2 (two) times daily. Call and schedule follow up appt to receive further refills. 2nd attempt. [phone] 30 tablet 0   montelukast (SINGULAIR) 10 MG tablet Take 10 mg by mouth at bedtime.     mupirocin ointment (BACTROBAN) 2 % Apply 1 Application topically 2 (two) times daily. 22 g 0   nicotine (NICODERM CQ - DOSED IN MG/24 HOURS) 21 mg/24hr patch Place 21 mg onto the skin daily.     nicotine polacrilex (NICORETTE) 4 MG gum Take 4 mg by mouth as needed for smoking cessation.     nitroGLYCERIN (NITROSTAT) 0.4 MG SL tablet Place 1 tablet (0.4 mg total) under the tongue every 5 (five) minutes x 3 doses as needed for chest pain. 25 tablet 2   oxybutynin (DITROPAN) 5 MG tablet Take 1 tablet (5 mg total) by mouth every 8 (eight) hours as needed for bladder spasms. 30 tablet 1   polyethylene glycol (MIRALAX / GLYCOLAX) 17 g packet Take 17 g by mouth as needed.     prochlorperazine (COMPAZINE) 10 MG tablet Take 1 tablet (10 mg total) by mouth every 6 (six) hours as needed for nausea or vomiting. 30 tablet 0   traMADol (ULTRAM) 50 MG tablet Take by mouth every 6 (six) hours as needed. Not taking     No current facility-administered medications for this visit.    SURGICAL HISTORY:  Past Surgical History:  Procedure Laterality Date   ABDOMINAL SURGERY     resection of small intestine    ANTERIOR CRUCIATE LIGAMENT REPAIR Bilateral    BACK SURGERY     fusion of lumbar   CARDIAC CATHETERIZATION  06/25/2012   COLON SURGERY     resection for bowel - for obstruction  6 to 7 yrs ago per pt on 01-05-2022   COLONOSCOPY     Hx; of   DILATION AND CURETTAGE OF UTERUS     EYE SURGERY Bilateral    cataracts removed   HERNIA REPAIR  02/23/1969   inguinal hernia    IR IMAGING GUIDED PORT INSERTION  06/07/2022   LEFT HEART CATHETERIZATION WITH CORONARY ANGIOGRAM N/A 03/30/2013   Procedure:  LEFT HEART CATHETERIZATION WITH CORONARY ANGIOGRAM;  Surgeon: Leonie Man, MD;  Location: [facility] CATH LAB;  Service: Cardiovascular;  Laterality: N/A;   left thumb surgery     yrs ago   ROBOT ASSITED LAPAROSCOPIC NEPHROURETERECTOMY Left 03/09/2021   Procedure: XI ROBOT ASSITED LAPAROSCOPIC NEPHROURETERECTOMY/ CYSTOSCOPY WITH LEFT URETEROSCOPY WITH TRANSURETHRAL RESECTION OF URETERAL ORIFICE;  Surgeon: Ceasar Mons, MD;  Location: [facility];  Service: Urology;  Laterality: Left;   SALIVARY GLAND SURGERY Left    approached from inside mouth & side of neck 1970-1980   TONSILLECTOMY     as child   TOTAL HIP ARTHROPLASTY Left 05/28/2014   Procedure: LEFT TOTAL HIP ARTHROPLASTY;  Surgeon: Yvette Rack., MD;  Location: [facility];  Service: Orthopedics;  Laterality: Left;   TRANSURETHRAL RESECTION OF BLADDER TUMOR N/A 08/23/2021   Procedure: TRANSURETHRAL RESECTION OF BLADDER TUMOR (TURBT) WITH CYSTOSCOPY/ POSTOPERATIVE INSTILLATION OF GEMCITABINE/retrograde pyelogram and stent placement (right);  Surgeon: Ceasar Mons, MD;  Location: [facility];  Service: Urology;  Laterality: N/A;   TRANSURETHRAL RESECTION OF BLADDER TUMOR N/A 01/10/2022   Procedure: TRANSURETHRAL RESECTION OF BLADDER TUMOR (TURBT) WITH CYSTOSCOPY / GEMCITABINE INSTILLATION post-operatively;  Surgeon: Ceasar Mons, MD;  Location: [facility];  Service: Urology;  Laterality: N/A;   ONLY NEEDS 30 MIN    REVIEW OF SYSTEMS:   Constitutional: Positive for improving fatigue. Negative for appetite change, chills, fever and unexpected weight change.  HENT: Negative for mouth sores, nosebleeds, sore throat and trouble swallowing.   Eyes: Negative for eye problems and icterus.  Respiratory: Positive for improving mild dyspnea on exertion.  Negative for cough, hemoptysis, and wheezing.   Cardiovascular: Negative for chest pain and leg swelling.  Gastrointestinal: Positive for occasional constipation. Negative for abdominal pain,  diarrhea, nausea and vomiting.  Genitourinary: Negative for bladder incontinence, difficulty urinating, dysuria, frequency and hematuria.   Musculoskeletal: Negative for back pain, gait problem, neck pain and neck stiffness.  Skin: Negative for itching and rash.  Neurological: Negative for dizziness, extremity weakness, gait problem, headaches, light-headedness and seizures.  Hematological: Negative for adenopathy. Does not bruise/bleed easily.  Psychiatric/Behavioral: Negative for confusion, depression and sleep disturbance. The patient is not nervous/anxious.     PHYSICAL EXAMINATION:  Blood pressure (!) 165/75, pulse 92, temperature 98.5 F (36.9 C), temperature source Oral, resp. rate 16, weight 184 lb 14.4 oz (83.9 kg), SpO2 95 %.  ECOG PERFORMANCE STATUS: 1  Physical Exam  Constitutional: Oriented to person, place, and time and well-developed, well-nourished, and in no distress.  HENT:  Head: Normocephalic and atraumatic.  Mouth/Throat: Oropharynx is clear and moist. No oropharyngeal exudate.  Eyes: Conjunctivae are normal. Right eye exhibits no discharge. Left eye exhibits no discharge. No scleral icterus.  Neck: Normal range of motion. Neck supple.  Cardiovascular: Normal rate, regular rhythm, normal heart sounds and intact distal pulses.   Pulmonary/Chest: Effort normal and breath sounds normal. No respiratory distress. No wheezes. No  rales.  Abdominal: Soft. Bowel sounds are normal. Exhibits no distension and no mass. There is no tenderness.  Musculoskeletal: Normal range of motion. Exhibits no edema.  Lymphadenopathy:    No cervical adenopathy.  Neurological: Alert and oriented to person, place, and time. Exhibits normal muscle tone. Gait normal. Coordination normal.  Skin: Skin is warm and dry. No rash noted. Not diaphoretic. No erythema. No pallor.  Psychiatric: Mood, memory and judgment normal.  Vitals reviewed.    LABORATORY DATA: Lab Results  Component  Value Date   WBC 13.3 (H) 09/20/2022   HGB 9.7 (L) 09/20/2022   HCT 29.3 (L) 09/20/2022   MCV 99.3 09/20/2022   PLT 363 09/20/2022      Chemistry      Component Value Date/Time   NA 137 09/13/2022 0910   K 3.8 09/13/2022 0910   CL 104 09/13/2022 0910   CO2 25 09/13/2022 0910   BUN 15 09/13/2022 0910   CREATININE 1.33 (H) 09/13/2022 0910      Component Value Date/Time   CALCIUM 8.7 (L) 09/13/2022 0910   ALKPHOS 157 (H) 09/13/2022 0910   AST 13 (L) 09/13/2022 0910   ALT 10 09/13/2022 0910   BILITOT 0.2 (L) 09/13/2022 0910       RADIOGRAPHIC STUDIES:  No results found.   ASSESSMENT/PLAN:  This is a very pleasant 72 year old African-American female with metastatic urothelial carcinoma.  She was initially diagnosed as a stage II (T2, N0, M0) in September 2022.   She is status post right nephroureterectomy as well as  intravesical treatment and resection of superficial tumor in the bladder several times and March 2023 as well as July 2023.  The patient was found to have enlarging and new pulmonary nodules consistent with metastatic disease in November 2023.    Dr. Julien Nordmann sent her tissue for foundation 1 and PD-L1 expression. She does not have any actionable mutations.   The patient is currently undergoing systemic chemotherapy with carboplatin for an AUC of 5 on day 1 and gemcitabine 1000 mg/m on days 1 and 8 IV every 3 weeks with Neulasta  support.  Her first dose of treatment was on 06/21/2022.  She is status post 4 cycles of treatment.  She experiences anemia following treatment which often requires blood transfusions, her most recent on 09/05/2022.   Labs were reviewed.  Recommend that she proceed with cycle #5 today scheduled. Dr. Julien Nordmann reduced her dose of carboplatin to an AUC of 4 and gemzar to 800 mg/m2.   We will see her back for follow-up visit in 3 weeks for evaluation repeat blood work before undergoing cycle #6.  We will continue to monitor her labs on a weekly basis. I have sent a scheduling request to have labs performed on 4/11 as well.   We will arrange for blood transfusion if her hemoglobin is less than 8.  She will continue to follow with orthopedics for her back pain.  Encouraged her to take a stool softener in addition to using laxative if needed.     The patient was advised to call immediately if she has any concerning symptoms in the interval. The patient voices understanding of current disease status and treatment options and is in agreement with the current care plan. All questions were answered. The patient knows to call the clinic with any problems, questions or concerns. We can certainly see the patient much sooner if necessary        No orders of the defined types were placed in this encounter.    The total time spent in the appointment was 20-29 minutes.   Maddux First L Smrithi Pigford, PA-C 09/20/22

## 2022-09-19 MED FILL — Fosaprepitant Dimeglumine For IV Infusion 150 MG (Base Eq): INTRAVENOUS | Qty: 5 | Status: AC

## 2022-09-19 MED FILL — Dexamethasone Sodium Phosphate Inj 100 MG/10ML: INTRAMUSCULAR | Qty: 1 | Status: AC

## 2022-09-20 ENCOUNTER — Inpatient Hospital Stay: Payer: Medicare Other

## 2022-09-20 ENCOUNTER — Other Ambulatory Visit: Payer: Self-pay

## 2022-09-20 ENCOUNTER — Inpatient Hospital Stay: Payer: Medicare Other | Admitting: Physician Assistant

## 2022-09-20 VITALS — BP 165/75 | HR 92 | Temp 98.5°F | Resp 16 | Wt 184.9 lb

## 2022-09-20 VITALS — BP 107/45 | HR 59 | Temp 98.1°F | Resp 17

## 2022-09-20 DIAGNOSIS — Z7902 Long term (current) use of antithrombotics/antiplatelets: Secondary | ICD-10-CM | POA: Diagnosis not present

## 2022-09-20 DIAGNOSIS — C7910 Secondary malignant neoplasm of unspecified urinary organs: Secondary | ICD-10-CM

## 2022-09-20 DIAGNOSIS — C7800 Secondary malignant neoplasm of unspecified lung: Secondary | ICD-10-CM

## 2022-09-20 DIAGNOSIS — R5383 Other fatigue: Secondary | ICD-10-CM | POA: Diagnosis not present

## 2022-09-20 DIAGNOSIS — D649 Anemia, unspecified: Secondary | ICD-10-CM

## 2022-09-20 DIAGNOSIS — Z885 Allergy status to narcotic agent status: Secondary | ICD-10-CM | POA: Diagnosis not present

## 2022-09-20 DIAGNOSIS — G4730 Sleep apnea, unspecified: Secondary | ICD-10-CM | POA: Diagnosis not present

## 2022-09-20 DIAGNOSIS — Z95828 Presence of other vascular implants and grafts: Secondary | ICD-10-CM

## 2022-09-20 DIAGNOSIS — G8929 Other chronic pain: Secondary | ICD-10-CM | POA: Diagnosis not present

## 2022-09-20 DIAGNOSIS — Z79630 Long term (current) use of alkylating agent: Secondary | ICD-10-CM | POA: Diagnosis not present

## 2022-09-20 DIAGNOSIS — C679 Malignant neoplasm of bladder, unspecified: Secondary | ICD-10-CM

## 2022-09-20 DIAGNOSIS — M549 Dorsalgia, unspecified: Secondary | ICD-10-CM | POA: Diagnosis not present

## 2022-09-20 DIAGNOSIS — Z79899 Other long term (current) drug therapy: Secondary | ICD-10-CM | POA: Diagnosis not present

## 2022-09-20 DIAGNOSIS — Z79631 Long term (current) use of antimetabolite agent: Secondary | ICD-10-CM | POA: Diagnosis not present

## 2022-09-20 DIAGNOSIS — Z5111 Encounter for antineoplastic chemotherapy: Secondary | ICD-10-CM

## 2022-09-20 DIAGNOSIS — R0609 Other forms of dyspnea: Secondary | ICD-10-CM | POA: Diagnosis not present

## 2022-09-20 DIAGNOSIS — R61 Generalized hyperhidrosis: Secondary | ICD-10-CM | POA: Diagnosis not present

## 2022-09-20 DIAGNOSIS — I252 Old myocardial infarction: Secondary | ICD-10-CM | POA: Diagnosis not present

## 2022-09-20 DIAGNOSIS — Z881 Allergy status to other antibiotic agents status: Secondary | ICD-10-CM | POA: Diagnosis not present

## 2022-09-20 DIAGNOSIS — K7689 Other specified diseases of liver: Secondary | ICD-10-CM | POA: Diagnosis not present

## 2022-09-20 DIAGNOSIS — K862 Cyst of pancreas: Secondary | ICD-10-CM | POA: Diagnosis not present

## 2022-09-20 DIAGNOSIS — Z905 Acquired absence of kidney: Secondary | ICD-10-CM | POA: Diagnosis not present

## 2022-09-20 DIAGNOSIS — F32A Depression, unspecified: Secondary | ICD-10-CM | POA: Diagnosis not present

## 2022-09-20 DIAGNOSIS — K5900 Constipation, unspecified: Secondary | ICD-10-CM | POA: Diagnosis not present

## 2022-09-20 LAB — CBC WITH DIFFERENTIAL (CANCER CENTER ONLY)
Abs Immature Granulocytes: 0.12 10*3/uL — ABNORMAL HIGH (ref 0.00–0.07)
Basophils Absolute: 0.1 10*3/uL (ref 0.0–0.1)
Basophils Relative: 0 %
Eosinophils Absolute: 0.1 10*3/uL (ref 0.0–0.5)
Eosinophils Relative: 1 %
HCT: 29.3 % — ABNORMAL LOW (ref 36.0–46.0)
Hemoglobin: 9.7 g/dL — ABNORMAL LOW (ref 12.0–15.0)
Immature Granulocytes: 1 %
Lymphocytes Relative: 19 %
Lymphs Abs: 2.6 10*3/uL (ref 0.7–4.0)
MCH: 32.9 pg (ref 26.0–34.0)
MCHC: 33.1 g/dL (ref 30.0–36.0)
MCV: 99.3 fL (ref 80.0–100.0)
Monocytes Absolute: 2.2 10*3/uL — ABNORMAL HIGH (ref 0.1–1.0)
Monocytes Relative: 17 %
Neutro Abs: 8.2 10*3/uL — ABNORMAL HIGH (ref 1.7–7.7)
Neutrophils Relative %: 62 %
Platelet Count: 363 10*3/uL (ref 150–400)
RBC: 2.95 MIL/uL — ABNORMAL LOW (ref 3.87–5.11)
RDW: 21.4 % — ABNORMAL HIGH (ref 11.5–15.5)
WBC Count: 13.3 10*3/uL — ABNORMAL HIGH (ref 4.0–10.5)
nRBC: 0.2 % (ref 0.0–0.2)

## 2022-09-20 LAB — CMP (CANCER CENTER ONLY)
ALT: 10 U/L (ref 0–44)
AST: 12 U/L — ABNORMAL LOW (ref 15–41)
Albumin: 3.8 g/dL (ref 3.5–5.0)
Alkaline Phosphatase: 110 U/L (ref 38–126)
Anion gap: 8 (ref 5–15)
BUN: 18 mg/dL (ref 8–23)
CO2: 26 mmol/L (ref 22–32)
Calcium: 8.8 mg/dL — ABNORMAL LOW (ref 8.9–10.3)
Chloride: 105 mmol/L (ref 98–111)
Creatinine: 1.44 mg/dL — ABNORMAL HIGH (ref 0.44–1.00)
GFR, Estimated: 38 mL/min — ABNORMAL LOW (ref >60)
Glucose, Bld: 120 mg/dL — ABNORMAL HIGH (ref 70–99)
Potassium: 3.9 mmol/L (ref 3.5–5.1)
Sodium: 139 mmol/L (ref 135–145)
Total Bilirubin: 0.2 mg/dL — ABNORMAL LOW (ref 0.3–1.2)
Total Protein: 6.7 g/dL (ref 6.5–8.1)

## 2022-09-20 LAB — SAMPLE TO BLOOD BANK

## 2022-09-20 MED ORDER — SODIUM CHLORIDE 0.9 % IV SOLN
10.0000 mg | Freq: Once | INTRAVENOUS | Status: AC
  Administered 2022-09-20: 10 mg via INTRAVENOUS
  Filled 2022-09-20: qty 10
  Filled 2022-09-20: qty 1

## 2022-09-20 MED ORDER — PALONOSETRON HCL INJECTION 0.25 MG/5ML
0.2500 mg | Freq: Once | INTRAVENOUS | Status: AC
  Administered 2022-09-20: 0.25 mg via INTRAVENOUS
  Filled 2022-09-20: qty 5

## 2022-09-20 MED ORDER — SODIUM CHLORIDE 0.9 % IV SOLN: Freq: Once | INTRAVENOUS | Status: AC

## 2022-09-20 MED ORDER — SODIUM CHLORIDE 0.9 % IV SOLN: Freq: Once | INTRAVENOUS | Status: DC

## 2022-09-20 MED ORDER — SODIUM CHLORIDE 0.9 % IV SOLN
800.0000 mg/m2 | Freq: Once | INTRAVENOUS | Status: AC
  Administered 2022-09-20: 1634 mg via INTRAVENOUS
  Filled 2022-09-20: qty 42.97

## 2022-09-20 MED ORDER — SODIUM CHLORIDE 0.9 % IV SOLN
302.4000 mg | Freq: Once | INTRAVENOUS | Status: AC
  Administered 2022-09-20: 300 mg via INTRAVENOUS
  Filled 2022-09-20: qty 30

## 2022-09-20 MED ORDER — SODIUM CHLORIDE 0.9 % IV SOLN
150.0000 mg | Freq: Once | INTRAVENOUS | Status: AC
  Administered 2022-09-20: 150 mg via INTRAVENOUS
  Filled 2022-09-20: qty 5
  Filled 2022-09-20: qty 150

## 2022-09-20 MED ORDER — HEPARIN SOD (PORK) LOCK FLUSH 100 UNIT/ML IV SOLN
500.0000 [IU] | Freq: Once | INTRAVENOUS | Status: AC | PRN
  Administered 2022-09-20: 500 [IU]

## 2022-09-20 MED ORDER — SODIUM CHLORIDE 0.9% FLUSH
10.0000 mL | Freq: Once | INTRAVENOUS | Status: AC
  Administered 2022-09-20: 10 mL

## 2022-09-20 MED ORDER — SODIUM CHLORIDE 0.9% FLUSH
10.0000 mL | INTRAVENOUS | Status: DC | PRN
  Administered 2022-09-20: 10 mL

## 2022-09-22 ENCOUNTER — Inpatient Hospital Stay: Payer: Medicare Other

## 2022-09-24 DIAGNOSIS — M47816 Spondylosis without myelopathy or radiculopathy, lumbar region: Secondary | ICD-10-CM | POA: Diagnosis not present

## 2022-09-25 ENCOUNTER — Other Ambulatory Visit: Payer: Self-pay

## 2022-09-27 ENCOUNTER — Inpatient Hospital Stay: Payer: Medicare Other | Attending: Internal Medicine

## 2022-09-27 ENCOUNTER — Inpatient Hospital Stay: Payer: Medicare Other

## 2022-09-27 ENCOUNTER — Other Ambulatory Visit: Payer: Self-pay | Admitting: Medical Oncology

## 2022-09-27 ENCOUNTER — Other Ambulatory Visit: Payer: Self-pay

## 2022-09-27 ENCOUNTER — Other Ambulatory Visit: Payer: Medicare Other

## 2022-09-27 ENCOUNTER — Other Ambulatory Visit: Payer: Self-pay | Admitting: Internal Medicine

## 2022-09-27 VITALS — BP 118/64 | HR 67 | Temp 98.0°F | Resp 18 | Wt 183.2 lb

## 2022-09-27 DIAGNOSIS — R0609 Other forms of dyspnea: Secondary | ICD-10-CM | POA: Diagnosis not present

## 2022-09-27 DIAGNOSIS — R5383 Other fatigue: Secondary | ICD-10-CM | POA: Diagnosis not present

## 2022-09-27 DIAGNOSIS — Z881 Allergy status to other antibiotic agents status: Secondary | ICD-10-CM | POA: Insufficient documentation

## 2022-09-27 DIAGNOSIS — R059 Cough, unspecified: Secondary | ICD-10-CM | POA: Diagnosis not present

## 2022-09-27 DIAGNOSIS — C7910 Secondary malignant neoplasm of unspecified urinary organs: Secondary | ICD-10-CM

## 2022-09-27 DIAGNOSIS — C7800 Secondary malignant neoplasm of unspecified lung: Secondary | ICD-10-CM | POA: Insufficient documentation

## 2022-09-27 DIAGNOSIS — Z79899 Other long term (current) drug therapy: Secondary | ICD-10-CM | POA: Diagnosis not present

## 2022-09-27 DIAGNOSIS — Z7902 Long term (current) use of antithrombotics/antiplatelets: Secondary | ICD-10-CM | POA: Insufficient documentation

## 2022-09-27 DIAGNOSIS — G8929 Other chronic pain: Secondary | ICD-10-CM

## 2022-09-27 DIAGNOSIS — M2550 Pain in unspecified joint: Secondary | ICD-10-CM | POA: Diagnosis not present

## 2022-09-27 DIAGNOSIS — Z79630 Long term (current) use of alkylating agent: Secondary | ICD-10-CM | POA: Diagnosis not present

## 2022-09-27 DIAGNOSIS — Z5189 Encounter for other specified aftercare: Secondary | ICD-10-CM | POA: Insufficient documentation

## 2022-09-27 DIAGNOSIS — D649 Anemia, unspecified: Secondary | ICD-10-CM

## 2022-09-27 DIAGNOSIS — Z885 Allergy status to narcotic agent status: Secondary | ICD-10-CM | POA: Insufficient documentation

## 2022-09-27 DIAGNOSIS — Z95828 Presence of other vascular implants and grafts: Secondary | ICD-10-CM

## 2022-09-27 DIAGNOSIS — Z5111 Encounter for antineoplastic chemotherapy: Secondary | ICD-10-CM | POA: Insufficient documentation

## 2022-09-27 DIAGNOSIS — Z79631 Long term (current) use of antimetabolite agent: Secondary | ICD-10-CM | POA: Diagnosis not present

## 2022-09-27 DIAGNOSIS — C679 Malignant neoplasm of bladder, unspecified: Secondary | ICD-10-CM

## 2022-09-27 LAB — CBC WITH DIFFERENTIAL (CANCER CENTER ONLY)
Abs Immature Granulocytes: 0.01 10*3/uL (ref 0.00–0.07)
Basophils Absolute: 0 10*3/uL (ref 0.0–0.1)
Basophils Relative: 1 %
Eosinophils Absolute: 0.1 10*3/uL (ref 0.0–0.5)
Eosinophils Relative: 1 %
HCT: 25.6 % — ABNORMAL LOW (ref 36.0–46.0)
Hemoglobin: 8.5 g/dL — ABNORMAL LOW (ref 12.0–15.0)
Immature Granulocytes: 0 %
Lymphocytes Relative: 42 %
Lymphs Abs: 1.9 10*3/uL (ref 0.7–4.0)
MCH: 33.6 pg (ref 26.0–34.0)
MCHC: 33.2 g/dL (ref 30.0–36.0)
MCV: 101.2 fL — ABNORMAL HIGH (ref 80.0–100.0)
Monocytes Absolute: 0.5 10*3/uL (ref 0.1–1.0)
Monocytes Relative: 11 %
Neutro Abs: 2 10*3/uL (ref 1.7–7.7)
Neutrophils Relative %: 45 %
Platelet Count: 255 10*3/uL (ref 150–400)
RBC: 2.53 MIL/uL — ABNORMAL LOW (ref 3.87–5.11)
RDW: 21.1 % — ABNORMAL HIGH (ref 11.5–15.5)
WBC Count: 4.4 10*3/uL (ref 4.0–10.5)
nRBC: 0 % (ref 0.0–0.2)

## 2022-09-27 LAB — CMP (CANCER CENTER ONLY)
ALT: 11 U/L (ref 0–44)
AST: 14 U/L — ABNORMAL LOW (ref 15–41)
Albumin: 3.8 g/dL (ref 3.5–5.0)
Alkaline Phosphatase: 91 U/L (ref 38–126)
Anion gap: 7 (ref 5–15)
BUN: 21 mg/dL (ref 8–23)
CO2: 26 mmol/L (ref 22–32)
Calcium: 9 mg/dL (ref 8.9–10.3)
Chloride: 103 mmol/L (ref 98–111)
Creatinine: 1.26 mg/dL — ABNORMAL HIGH (ref 0.44–1.00)
GFR, Estimated: 45 mL/min — ABNORMAL LOW (ref >60)
Glucose, Bld: 103 mg/dL — ABNORMAL HIGH (ref 70–99)
Potassium: 4.2 mmol/L (ref 3.5–5.1)
Sodium: 136 mmol/L (ref 135–145)
Total Bilirubin: 0.4 mg/dL (ref 0.3–1.2)
Total Protein: 6.7 g/dL (ref 6.5–8.1)

## 2022-09-27 LAB — SAMPLE TO BLOOD BANK

## 2022-09-27 MED ORDER — PROCHLORPERAZINE MALEATE 10 MG PO TABS
10.0000 mg | ORAL_TABLET | Freq: Once | ORAL | Status: AC
  Administered 2022-09-27: 10 mg via ORAL
  Filled 2022-09-27: qty 1

## 2022-09-27 MED ORDER — SODIUM CHLORIDE 0.9 % IV SOLN
800.0000 mg/m2 | Freq: Once | INTRAVENOUS | Status: AC
  Administered 2022-09-27: 1634 mg via INTRAVENOUS
  Filled 2022-09-27: qty 42.97

## 2022-09-27 MED ORDER — HEPARIN SOD (PORK) LOCK FLUSH 100 UNIT/ML IV SOLN
500.0000 [IU] | Freq: Once | INTRAVENOUS | Status: AC | PRN
  Administered 2022-09-27: 500 [IU]

## 2022-09-27 MED ORDER — SODIUM CHLORIDE 0.9% FLUSH
10.0000 mL | INTRAVENOUS | Status: DC | PRN
  Administered 2022-09-27: 10 mL

## 2022-09-27 MED ORDER — SODIUM CHLORIDE 0.9% FLUSH
10.0000 mL | Freq: Once | INTRAVENOUS | Status: AC
  Administered 2022-09-27: 10 mL

## 2022-09-27 MED ORDER — SODIUM CHLORIDE 0.9 % IV SOLN: Freq: Once | INTRAVENOUS | Status: AC

## 2022-09-27 MED ORDER — OXYCODONE HCL 5 MG PO TABS
5.0000 mg | ORAL_TABLET | Freq: Once | ORAL | Status: AC
  Administered 2022-09-27: 5 mg via ORAL
  Filled 2022-09-27: qty 1

## 2022-09-27 NOTE — Patient Instructions (Signed)
[facility]  Discharge Instructions: Thank you for choosing [facility] to provide your oncology and hematology care.   If you have a lab appointment with the Cancer Center, please go directly to the Cancer Center and check in at the registration area.   Wear comfortable clothing and clothing appropriate for easy access to any Portacath or PICC line.   We strive to give you quality time with your provider. You may need to reschedule your appointment if you arrive late (15 or more minutes).  Arriving late affects you and other patients whose appointments are after yours.  Also, if you miss three or more appointments without notifying the office, you may be dismissed from the clinic at the provider's discretion.      For prescription refill requests, have your pharmacy contact our office and allow 72 hours for refills to be completed.    Today you received the following chemotherapy and/or immunotherapy agents: Gemzar      To help prevent nausea and vomiting after your treatment, we encourage you to take your nausea medication as directed.  BELOW ARE SYMPTOMS THAT SHOULD BE REPORTED IMMEDIATELY: *FEVER GREATER THAN 100.4 F (38 C) OR HIGHER *CHILLS OR SWEATING *NAUSEA AND VOMITING THAT IS NOT CONTROLLED WITH YOUR NAUSEA MEDICATION *UNUSUAL SHORTNESS OF BREATH *UNUSUAL BRUISING OR BLEEDING *URINARY PROBLEMS (pain or burning when urinating, or frequent urination) *BOWEL PROBLEMS (unusual diarrhea, constipation, pain near the anus) TENDERNESS IN MOUTH AND THROAT WITH OR WITHOUT PRESENCE OF ULCERS (sore throat, sores in mouth, or a toothache) UNUSUAL RASH, SWELLING OR PAIN  UNUSUAL VAGINAL DISCHARGE OR ITCHING   Items with * indicate a potential emergency and should be followed up as soon as possible or go to the Emergency Department if any problems should occur.  Please show the CHEMOTHERAPY ALERT CARD or IMMUNOTHERAPY ALERT CARD at check-in  to the Emergency Department and triage nurse.  Should you have questions after your visit or need to cancel or reschedule your appointment, please contact [facility]  Dept: [phone]  and follow the prompts.  Office hours are 8:00 a.m. to 4:30 p.m. Monday - Friday. Please note that voicemails left after 4:00 p.m. may not be returned until the following business day.  We are closed weekends and major holidays. You have access to a nurse at all times for urgent questions. Please call the main number to the clinic Dept: [phone] and follow the prompts.   For any non-urgent questions, you may also contact your provider using MyChart. We now offer e-Visits for anyone 18 and older to request care online for non-urgent symptoms. For details visit [email or web address].   Also download the MyChart app! Go to the app store, search "MyChart", open the app, select [facility], and log in with your MyChart username and password.   

## 2022-09-27 NOTE — Progress Notes (Signed)
Pt arrived complaining to this RN of chronic back pain 7/10 r/t an old injury. Pt reports she takes tylenol at home and uses heat and ice. Pt requested oxycontin for pain.  This RN contacted Dr Earlie Server for orders, which he approved 5mg  oxycodone. Per Dr Lew Dawes request, pt was educated that since this pain is not r/t her cancer, she must follow up with her PCP for any pain medicine in the future.

## 2022-09-29 ENCOUNTER — Inpatient Hospital Stay: Payer: Medicare Other

## 2022-09-29 VITALS — BP 141/67 | HR 75 | Temp 97.0°F | Resp 18

## 2022-09-29 DIAGNOSIS — Z7902 Long term (current) use of antithrombotics/antiplatelets: Secondary | ICD-10-CM | POA: Diagnosis not present

## 2022-09-29 DIAGNOSIS — Z885 Allergy status to narcotic agent status: Secondary | ICD-10-CM | POA: Diagnosis not present

## 2022-09-29 DIAGNOSIS — Z5189 Encounter for other specified aftercare: Secondary | ICD-10-CM | POA: Diagnosis not present

## 2022-09-29 DIAGNOSIS — Z79899 Other long term (current) drug therapy: Secondary | ICD-10-CM | POA: Diagnosis not present

## 2022-09-29 DIAGNOSIS — Z5111 Encounter for antineoplastic chemotherapy: Secondary | ICD-10-CM | POA: Diagnosis not present

## 2022-09-29 DIAGNOSIS — C7800 Secondary malignant neoplasm of unspecified lung: Secondary | ICD-10-CM | POA: Diagnosis not present

## 2022-09-29 DIAGNOSIS — R5383 Other fatigue: Secondary | ICD-10-CM | POA: Diagnosis not present

## 2022-09-29 DIAGNOSIS — Z881 Allergy status to other antibiotic agents status: Secondary | ICD-10-CM | POA: Diagnosis not present

## 2022-09-29 DIAGNOSIS — Z79631 Long term (current) use of antimetabolite agent: Secondary | ICD-10-CM | POA: Diagnosis not present

## 2022-09-29 DIAGNOSIS — Z79630 Long term (current) use of alkylating agent: Secondary | ICD-10-CM | POA: Diagnosis not present

## 2022-09-29 DIAGNOSIS — R0609 Other forms of dyspnea: Secondary | ICD-10-CM | POA: Diagnosis not present

## 2022-09-29 DIAGNOSIS — M2550 Pain in unspecified joint: Secondary | ICD-10-CM | POA: Diagnosis not present

## 2022-09-29 DIAGNOSIS — C679 Malignant neoplasm of bladder, unspecified: Secondary | ICD-10-CM | POA: Diagnosis not present

## 2022-09-29 DIAGNOSIS — C7910 Secondary malignant neoplasm of unspecified urinary organs: Secondary | ICD-10-CM

## 2022-09-29 DIAGNOSIS — R059 Cough, unspecified: Secondary | ICD-10-CM | POA: Diagnosis not present

## 2022-09-29 MED ORDER — PEGFILGRASTIM INJECTION 6 MG/0.6ML ~~LOC~~
6.0000 mg | PREFILLED_SYRINGE | Freq: Once | SUBCUTANEOUS | Status: AC
  Administered 2022-09-29: 6 mg via SUBCUTANEOUS

## 2022-10-02 ENCOUNTER — Other Ambulatory Visit: Payer: Self-pay | Admitting: Medical Oncology

## 2022-10-02 DIAGNOSIS — C7910 Secondary malignant neoplasm of unspecified urinary organs: Secondary | ICD-10-CM

## 2022-10-02 DIAGNOSIS — D649 Anemia, unspecified: Secondary | ICD-10-CM

## 2022-10-03 DIAGNOSIS — M545 Low back pain, unspecified: Secondary | ICD-10-CM | POA: Diagnosis not present

## 2022-10-04 ENCOUNTER — Ambulatory Visit: Payer: Medicare Other | Admitting: Physician Assistant

## 2022-10-04 ENCOUNTER — Ambulatory Visit: Payer: Medicare Other

## 2022-10-04 ENCOUNTER — Other Ambulatory Visit: Payer: Self-pay

## 2022-10-04 ENCOUNTER — Inpatient Hospital Stay: Payer: Medicare Other

## 2022-10-04 ENCOUNTER — Other Ambulatory Visit: Payer: Medicare Other

## 2022-10-04 ENCOUNTER — Encounter: Payer: Medicare Other | Admitting: Physician Assistant

## 2022-10-04 VITALS — BP 117/55 | HR 60 | Temp 98.4°F | Resp 16

## 2022-10-04 DIAGNOSIS — Z79899 Other long term (current) drug therapy: Secondary | ICD-10-CM | POA: Diagnosis not present

## 2022-10-04 DIAGNOSIS — Z79630 Long term (current) use of alkylating agent: Secondary | ICD-10-CM | POA: Diagnosis not present

## 2022-10-04 DIAGNOSIS — Z885 Allergy status to narcotic agent status: Secondary | ICD-10-CM | POA: Diagnosis not present

## 2022-10-04 DIAGNOSIS — Z5189 Encounter for other specified aftercare: Secondary | ICD-10-CM | POA: Diagnosis not present

## 2022-10-04 DIAGNOSIS — D649 Anemia, unspecified: Secondary | ICD-10-CM

## 2022-10-04 DIAGNOSIS — Z5111 Encounter for antineoplastic chemotherapy: Secondary | ICD-10-CM | POA: Diagnosis not present

## 2022-10-04 DIAGNOSIS — Z79631 Long term (current) use of antimetabolite agent: Secondary | ICD-10-CM | POA: Diagnosis not present

## 2022-10-04 DIAGNOSIS — Z95828 Presence of other vascular implants and grafts: Secondary | ICD-10-CM

## 2022-10-04 DIAGNOSIS — C7910 Secondary malignant neoplasm of unspecified urinary organs: Secondary | ICD-10-CM

## 2022-10-04 DIAGNOSIS — M2550 Pain in unspecified joint: Secondary | ICD-10-CM | POA: Diagnosis not present

## 2022-10-04 DIAGNOSIS — C679 Malignant neoplasm of bladder, unspecified: Secondary | ICD-10-CM | POA: Diagnosis not present

## 2022-10-04 DIAGNOSIS — Z881 Allergy status to other antibiotic agents status: Secondary | ICD-10-CM | POA: Diagnosis not present

## 2022-10-04 DIAGNOSIS — C7800 Secondary malignant neoplasm of unspecified lung: Secondary | ICD-10-CM

## 2022-10-04 DIAGNOSIS — Z7902 Long term (current) use of antithrombotics/antiplatelets: Secondary | ICD-10-CM | POA: Diagnosis not present

## 2022-10-04 DIAGNOSIS — R5383 Other fatigue: Secondary | ICD-10-CM | POA: Diagnosis not present

## 2022-10-04 DIAGNOSIS — R059 Cough, unspecified: Secondary | ICD-10-CM | POA: Diagnosis not present

## 2022-10-04 DIAGNOSIS — R0609 Other forms of dyspnea: Secondary | ICD-10-CM | POA: Diagnosis not present

## 2022-10-04 LAB — CBC WITH DIFFERENTIAL (CANCER CENTER ONLY)
Abs Immature Granulocytes: 1.39 10*3/uL — ABNORMAL HIGH (ref 0.00–0.07)
Basophils Absolute: 0.2 10*3/uL — ABNORMAL HIGH (ref 0.0–0.1)
Basophils Relative: 1 %
Eosinophils Absolute: 0.1 10*3/uL (ref 0.0–0.5)
Eosinophils Relative: 0 %
HCT: 21.7 % — ABNORMAL LOW (ref 36.0–46.0)
Hemoglobin: 7.5 g/dL — ABNORMAL LOW (ref 12.0–15.0)
Immature Granulocytes: 5 %
Lymphocytes Relative: 10 %
Lymphs Abs: 3 10*3/uL (ref 0.7–4.0)
MCH: 34.9 pg — ABNORMAL HIGH (ref 26.0–34.0)
MCHC: 34.6 g/dL (ref 30.0–36.0)
MCV: 100.9 fL — ABNORMAL HIGH (ref 80.0–100.0)
Monocytes Absolute: 3.8 10*3/uL — ABNORMAL HIGH (ref 0.1–1.0)
Monocytes Relative: 12 %
Neutro Abs: 21.8 10*3/uL — ABNORMAL HIGH (ref 1.7–7.7)
Neutrophils Relative %: 72 %
Platelet Count: 51 10*3/uL — ABNORMAL LOW (ref 150–400)
RBC: 2.15 MIL/uL — ABNORMAL LOW (ref 3.87–5.11)
RDW: 21.2 % — ABNORMAL HIGH (ref 11.5–15.5)
Smear Review: NORMAL
WBC Count: 30.2 10*3/uL — ABNORMAL HIGH (ref 4.0–10.5)
nRBC: 0.1 % (ref 0.0–0.2)

## 2022-10-04 LAB — SAMPLE TO BLOOD BANK

## 2022-10-04 LAB — CMP (CANCER CENTER ONLY)
ALT: 10 U/L (ref 0–44)
AST: 12 U/L — ABNORMAL LOW (ref 15–41)
Albumin: 3.8 g/dL (ref 3.5–5.0)
Alkaline Phosphatase: 147 U/L — ABNORMAL HIGH (ref 38–126)
Anion gap: 6 (ref 5–15)
BUN: 23 mg/dL (ref 8–23)
CO2: 29 mmol/L (ref 22–32)
Calcium: 9.1 mg/dL (ref 8.9–10.3)
Chloride: 100 mmol/L (ref 98–111)
Creatinine: 1.57 mg/dL — ABNORMAL HIGH (ref 0.44–1.00)
GFR, Estimated: 35 mL/min — ABNORMAL LOW (ref >60)
Glucose, Bld: 121 mg/dL — ABNORMAL HIGH (ref 70–99)
Potassium: 3.9 mmol/L (ref 3.5–5.1)
Sodium: 135 mmol/L (ref 135–145)
Total Bilirubin: 0.2 mg/dL — ABNORMAL LOW (ref 0.3–1.2)
Total Protein: 6.5 g/dL (ref 6.5–8.1)

## 2022-10-04 LAB — TYPE AND SCREEN: ABO/RH(D): B NEG

## 2022-10-04 LAB — PREPARE RBC (CROSSMATCH)

## 2022-10-04 MED ORDER — HEPARIN SOD (PORK) LOCK FLUSH 100 UNIT/ML IV SOLN
500.0000 [IU] | Freq: Once | INTRAVENOUS | Status: AC
  Administered 2022-10-04: 500 [IU]

## 2022-10-04 MED ORDER — SODIUM CHLORIDE 0.9 % IV SOLN: Freq: Once | INTRAVENOUS | Status: AC

## 2022-10-04 MED ORDER — SODIUM CHLORIDE 0.9% FLUSH
10.0000 mL | Freq: Once | INTRAVENOUS | Status: AC
  Administered 2022-10-04: 10 mL

## 2022-10-04 NOTE — Progress Notes (Signed)
Blood orders entered for [facility] encounter.

## 2022-10-04 NOTE — Patient Instructions (Signed)

## 2022-10-05 ENCOUNTER — Inpatient Hospital Stay: Payer: Medicare Other

## 2022-10-05 ENCOUNTER — Other Ambulatory Visit: Payer: Self-pay

## 2022-10-05 ENCOUNTER — Telehealth: Payer: Self-pay | Admitting: Physician Assistant

## 2022-10-05 ENCOUNTER — Telehealth: Payer: Self-pay

## 2022-10-05 DIAGNOSIS — Z881 Allergy status to other antibiotic agents status: Secondary | ICD-10-CM | POA: Diagnosis not present

## 2022-10-05 DIAGNOSIS — Z5111 Encounter for antineoplastic chemotherapy: Secondary | ICD-10-CM | POA: Diagnosis not present

## 2022-10-05 DIAGNOSIS — Z79899 Other long term (current) drug therapy: Secondary | ICD-10-CM | POA: Diagnosis not present

## 2022-10-05 DIAGNOSIS — D649 Anemia, unspecified: Secondary | ICD-10-CM

## 2022-10-05 DIAGNOSIS — Z5189 Encounter for other specified aftercare: Secondary | ICD-10-CM | POA: Diagnosis not present

## 2022-10-05 DIAGNOSIS — Z79631 Long term (current) use of antimetabolite agent: Secondary | ICD-10-CM | POA: Diagnosis not present

## 2022-10-05 DIAGNOSIS — Z7902 Long term (current) use of antithrombotics/antiplatelets: Secondary | ICD-10-CM | POA: Diagnosis not present

## 2022-10-05 DIAGNOSIS — R059 Cough, unspecified: Secondary | ICD-10-CM | POA: Diagnosis not present

## 2022-10-05 DIAGNOSIS — C7800 Secondary malignant neoplasm of unspecified lung: Secondary | ICD-10-CM | POA: Diagnosis not present

## 2022-10-05 DIAGNOSIS — Z885 Allergy status to narcotic agent status: Secondary | ICD-10-CM | POA: Diagnosis not present

## 2022-10-05 DIAGNOSIS — M2550 Pain in unspecified joint: Secondary | ICD-10-CM | POA: Diagnosis not present

## 2022-10-05 DIAGNOSIS — R5383 Other fatigue: Secondary | ICD-10-CM | POA: Diagnosis not present

## 2022-10-05 DIAGNOSIS — C679 Malignant neoplasm of bladder, unspecified: Secondary | ICD-10-CM | POA: Diagnosis not present

## 2022-10-05 DIAGNOSIS — R0609 Other forms of dyspnea: Secondary | ICD-10-CM | POA: Diagnosis not present

## 2022-10-05 DIAGNOSIS — Z79630 Long term (current) use of alkylating agent: Secondary | ICD-10-CM | POA: Diagnosis not present

## 2022-10-05 LAB — BPAM RBC
ISSUE DATE / TIME: 202404121151
ISSUE DATE / TIME: 202404121151
Unit Type and Rh: 1700

## 2022-10-05 LAB — TYPE AND SCREEN
Antibody Screen: NEGATIVE
Unit division: 0
Unit division: 0

## 2022-10-05 LAB — PREPARE RBC (CROSSMATCH)

## 2022-10-05 MED ORDER — DIPHENHYDRAMINE HCL 25 MG PO CAPS
25.0000 mg | ORAL_CAPSULE | Freq: Once | ORAL | Status: DC
  Filled 2022-10-05: qty 1

## 2022-10-05 MED ORDER — DIPHENHYDRAMINE HCL 25 MG PO CAPS
25.0000 mg | ORAL_CAPSULE | Freq: Once | ORAL | Status: DC
  Administered 2022-10-05: 25 mg via ORAL

## 2022-10-05 MED ORDER — ACETAMINOPHEN 325 MG PO TABS
650.0000 mg | ORAL_TABLET | Freq: Once | ORAL | Status: AC
  Administered 2022-10-05: 650 mg via ORAL
  Filled 2022-10-05: qty 2

## 2022-10-05 MED ORDER — SODIUM CHLORIDE 0.9% IV SOLUTION
250.0000 mL | Freq: Once | INTRAVENOUS | Status: AC
  Administered 2022-10-05: 250 mL via INTRAVENOUS

## 2022-10-05 MED ORDER — SODIUM CHLORIDE 0.9% FLUSH
10.0000 mL | INTRAVENOUS | Status: AC | PRN
  Administered 2022-10-05: 10 mL

## 2022-10-05 MED ORDER — HEPARIN SOD (PORK) LOCK FLUSH 100 UNIT/ML IV SOLN
500.0000 [IU] | Freq: Every day | INTRAVENOUS | Status: AC | PRN
  Administered 2022-10-05: 500 [IU]

## 2022-10-05 NOTE — Patient Instructions (Signed)

## 2022-10-05 NOTE — Progress Notes (Signed)
Additional PRBC order placed.

## 2022-10-05 NOTE — Telephone Encounter (Signed)
Per Dr. Arbutus Ped, patient is to receive 2 units PRBC today. Patient originally was going to get 1 unit. Appointment time was adjusted accordingly and patient made aware of the change. Additional order placed and blood bank made aware.

## 2022-10-06 ENCOUNTER — Ambulatory Visit (HOSPITAL_COMMUNITY)
Admission: EM | Admit: 2022-10-06 | Discharge: 2022-10-06 | Disposition: A | Discharge status: Home / Self Care | Payer: Medicare Other | Attending: Emergency Medicine | Admitting: Emergency Medicine

## 2022-10-06 ENCOUNTER — Telehealth (HOSPITAL_COMMUNITY): Payer: Self-pay | Admitting: Emergency Medicine

## 2022-10-06 ENCOUNTER — Encounter (HOSPITAL_COMMUNITY): Payer: Self-pay

## 2022-10-06 DIAGNOSIS — R220 Localized swelling, mass and lump, head: Secondary | ICD-10-CM

## 2022-10-06 DIAGNOSIS — K068 Other specified disorders of gingiva and edentulous alveolar ridge: Secondary | ICD-10-CM | POA: Diagnosis not present

## 2022-10-06 LAB — TYPE AND SCREEN

## 2022-10-06 LAB — BPAM RBC
Blood Product Expiration Date: 202405082359
Blood Product Expiration Date: 202405092359
Unit Type and Rh: 1700

## 2022-10-06 MED ORDER — NYSTATIN 100000 UNIT/ML MT SUSP: 5.0000 mL | Freq: Four times a day (QID) | OROMUCOSAL | 0 refills | Status: AC | PRN

## 2022-10-06 MED ORDER — NYSTATIN 100000 UNIT/ML MT SUSP: 5.0000 mL | Freq: Four times a day (QID) | OROMUCOSAL | 0 refills | Status: DC | PRN

## 2022-10-06 MED ORDER — LIDOCAINE VISCOUS HCL 2 % MT SOLN
OROMUCOSAL | Status: AC
  Filled 2022-10-06: qty 15

## 2022-10-06 MED ORDER — LIDOCAINE VISCOUS HCL 2 % MT SOLN
15.0000 mL | Freq: Once | OROMUCOSAL | Status: AC
  Administered 2022-10-06: 15 mL via OROMUCOSAL

## 2022-10-06 MED ORDER — AMOXICILLIN-POT CLAVULANATE 875-125 MG PO TABS: 1.0000 | ORAL_TABLET | Freq: Two times a day (BID) | ORAL | 0 refills | Status: DC

## 2022-10-06 NOTE — Telephone Encounter (Signed)
Patient presents back to clinic for paper prescription of the Magic mouthwash.  [facility] does not have medication in stock and other pharmacies are unable to compound.

## 2022-10-06 NOTE — Discharge Instructions (Addendum)
You do have swelling and tenderness to the roof of your mouth.  I am starting you on antibiotics, please take all antibiotics as prescribed and until finished.  You can take them with food to help prevent gastrointestinal upset.  Please also use the Magic mouthwash rinse 4 times daily as needed for up to 10 days for mouth pain.  This medication does have lidocaine in it and it can help numb the pain in your mouth.  I am unsure why you are having this dental pain, it is important to follow-up with a dentist on Monday for further evaluation.  Please seek immediate care if you develop fever, shortness of breath, inability to swallow, or any new concerning symptoms.

## 2022-10-06 NOTE — ED Provider Notes (Signed)
[facility]    CSN: [id number] Arrival date & time: 10/06/22  1405      History   Chief Complaint Chief Complaint  Patient presents with   Dental Pain   Abscess    HPI Morgan Torres is a 72 y.o. female.   Patient presents to clinic for dental pain and potential abscess that started earlier this week.  She has been unable to wear her dentures due to the pain and swelling in her left upper gumline.  She has been doing warm salt water gargles and rinses with hydrogen peroxide without much relief.  She is a cancer patient is receiving active treatment for bladder cancer.  Reports she just had a transfusion of 2 units of blood yesterday.  She denies any chest pain, shortness of breath or drainage from the area.  Has taken a tramadol today, was recently prescribed this for back pain.  This has not really helped her mouth pain.  The history is provided by the patient and medical records.  Dental Pain Associated symptoms: no fever   Abscess Associated symptoms: no fatigue and no fever     Past Medical History:  Diagnosis Date   Anxiety    Arthritis    hip, lumbar spine    Asthma    seasonal allergies    Bronchitis    Hx: of   Chronic kidney disease    COPD (chronic obstructive pulmonary disease)    Coronary artery disease    Depression    Fibromyalgia    Hypertension    Lumbar herniated disc    Myocardial infarction 06/25/2012   followed by Dr. Rennis Golden, treated medically, no stents   Peripheral arterial disease    of right foot   Pneumonia    Hx: of yrs ago   Sciatica    Sleep apnea    Small bowel obstruction    several yrs ago    Patient Active Problem List   Diagnosis Date Noted   Port-A-Cath in place 07/12/2022   Metastatic urothelial carcinoma 05/30/2022   Bladder cancer metastasized to lung 05/30/2022   Encounter for antineoplastic chemotherapy 05/30/2022   Renal mass 03/09/2021   Morbid obesity, unspecified obesity type 08/28/2016    Non-restorative sleep 08/28/2016   Snoring 08/28/2016   Hip pain 06/03/2014   Acute blood loss anemia 06/01/2014   Osteoarthritis of left hip 05/28/2014   Obesity (BMI 30.0-34.9) 04/20/2013   Nonischemic cardiomyopathy 04/01/2013   Smoking 04/01/2013   NSTEMI - Troponin pk 1.5 with ant TWI on EKG 03/29/2013   Spinal stenosis, lumbar region, with neurogenic claudication 01/23/2013   VAGINAL TRICHOMONIASIS 06/28/2006   Hyperlipidemia 06/28/2006   ANEMIA-NOS 06/28/2006   ANXIETY 06/28/2006   DEPRESSION 06/28/2006   SYNDROME, CARPAL TUNNEL 06/28/2006   Essential hypertension 06/28/2006   ALLERGIC RHINITIS 06/28/2006   ASTHMA 06/28/2006   PAROTIDITIS 06/28/2006   LOW BACK PAIN 06/28/2006   TB SKIN TEST, POSITIVE, HX OF 06/28/2006    Past Surgical History:  Procedure Laterality Date   ABDOMINAL SURGERY     resection of small intestine   ANTERIOR CRUCIATE LIGAMENT REPAIR Bilateral    BACK SURGERY     fusion of lumbar   CARDIAC CATHETERIZATION  06/25/2012   COLON SURGERY     resection for bowel - for obstruction  6 to 7 yrs ago per pt on 01-05-2022   COLONOSCOPY     Hx; of   DILATION AND CURETTAGE OF UTERUS     EYE  SURGERY Bilateral    cataracts removed   HERNIA REPAIR  02/23/1969   inguinal hernia    IR IMAGING GUIDED PORT INSERTION  06/07/2022   LEFT HEART CATHETERIZATION WITH CORONARY ANGIOGRAM N/A 03/30/2013   Procedure: LEFT HEART CATHETERIZATION WITH CORONARY ANGIOGRAM;  Surgeon: Marykay Lex, MD;  Location: [facility] CATH LAB;  Service: Cardiovascular;  Laterality: N/A;   left thumb surgery     yrs ago   ROBOT ASSITED LAPAROSCOPIC NEPHROURETERECTOMY Left 03/09/2021   Procedure: XI ROBOT ASSITED LAPAROSCOPIC NEPHROURETERECTOMY/ CYSTOSCOPY WITH LEFT URETEROSCOPY WITH TRANSURETHRAL RESECTION OF URETERAL ORIFICE;  Surgeon: Rene Paci, MD;  Location: [facility];  Service: Urology;  Laterality: Left;   SALIVARY GLAND SURGERY Left    approached from inside mouth & side  of neck 1970-1980   TONSILLECTOMY     as child   TOTAL HIP ARTHROPLASTY Left 05/28/2014   Procedure: LEFT TOTAL HIP ARTHROPLASTY;  Surgeon: Thera Flake., MD;  Location: [facility];  Service: Orthopedics;  Laterality: Left;   TRANSURETHRAL RESECTION OF BLADDER TUMOR N/A 08/23/2021   Procedure: TRANSURETHRAL RESECTION OF BLADDER TUMOR (TURBT) WITH CYSTOSCOPY/ POSTOPERATIVE INSTILLATION OF GEMCITABINE/retrograde pyelogram and stent placement (right);  Surgeon: Rene Paci, MD;  Location[facility];  Service: Urology;  Laterality: N/A;   TRANSURETHRAL RESECTION OF BLADDER TUMOR N/A 01/10/2022   Procedure: TRANSURETHRAL RESECTION OF BLADDER TUMOR (TURBT) WITH CYSTOSCOPY / GEMCITABINE INSTILLATION post-operatively;  Surgeon: Rene Paci, MD;  Location: [facility];  Service: Urology;  Laterality: N/A;  ONLY NEEDS 30 MIN    OB History   No obstetric history on file.      Home Medications    Prior to Admission medications   Medication Sig Start Date End Date Taking? Authorizing Provider  acetaminophen (TYLENOL) 500 MG tablet Take 2 tablets (1,000 mg total) by mouth every 6 (six) hours as needed for mild pain. 02/09/22  Yes Carlisle Beers, FNP  albuterol (PROVENTIL HFA;VENTOLIN HFA) 108 (90 Base) MCG/ACT inhaler Inhale 2 puffs into the lungs every 6 (six) hours as needed for wheezing or shortness of breath. 05/10/16  Yes Amyot, Ali Lowe, NP  amLODipine (NORVASC) 5 MG tablet TAKE 1 TABLET BY MOUTH EVERY DAY 08/15/22  Yes Hilty, Lisette Abu, MD  aspirin EC 81 MG tablet Take 81 mg by mouth daily. Swallow whole.   Yes [provider]  atorvastatin (LIPITOR) 20 MG tablet Take 20 mg by mouth. Monday wed and Friday in am 11/25/20  Yes [provider]  cilostazol (PLETAL) 100 MG tablet Take 100 mg by mouth 2 (two) times daily.   Yes [provider]  lidocaine-prilocaine (EMLA) cream Apply to the Port-A-Cath site  30-60-minute before treatment 08/14/22  Yes Heilingoetter, Cassandra L, PA-C  LORazepam (ATIVAN) 0.5 MG tablet Take 0.5 mg by mouth daily as needed for anxiety.   Yes [provider]  losartan-hydrochlorothiazide (HYZAAR) 100-12.5 MG tablet Take 1 tablet by mouth daily.   Yes [provider]  metoprolol tartrate (LOPRESSOR) 25 MG tablet Take 1 tablet (25 mg total) by mouth 2 (two) times daily. Call and schedule follow up appt to receive further refills. 2nd attempt. [phone] 08/27/22  Yes Little Ishikawa, MD  montelukast (SINGULAIR) 10 MG tablet Take 10 mg by mouth at bedtime.   Yes [provider]  nicotine (NICODERM CQ - DOSED IN MG/24 HOURS) 21 mg/24hr patch Place 21 mg onto the skin daily.   Yes [provider]  nicotine  polacrilex (NICORETTE) 4 MG gum Take 4 mg by mouth as needed for smoking cessation.   Yes [provider]  polyethylene glycol (MIRALAX / GLYCOLAX) 17 g packet Take 17 g by mouth as needed.   Yes [provider]  prochlorperazine (COMPAZINE) 10 MG tablet Take 1 tablet (10 mg total) by mouth every 6 (six) hours as needed for nausea or vomiting. 06/21/22  Yes Si Gaul, MD  traMADol (ULTRAM) 50 MG tablet Take by mouth every 6 (six) hours as needed. Not taking   Yes [provider]  amoxicillin-clavulanate (AUGMENTIN) 875-125 MG tablet Take 1 tablet by mouth every 12 (twelve) hours. 10/06/22  Yes Rinaldo Ratel, [place] N, FNP  EPINEPHrine (EPIPEN 2-PAK) 0.3 mg/0.3 mL DEVI Inject 0.3 mLs (0.3 mg total) into the muscle once as needed (for severe allergic reaction). CAll 911 immediately if you have to use this medicine 11/26/12   Piepenbrink, Victorino Dike, PA-C  magic mouthwash (nystatin, lidocaine, diphenhydrAMINE) suspension Take 5 mLs by mouth 4 (four) times daily as needed for up to 10 days for mouth pain. 10/06/22 10/16/22 Yes Rinaldo Ratel, [place] N, FNP  nitroGLYCERIN (NITROSTAT) 0.4 MG SL tablet Place 1 tablet (0.4 mg  total) under the tongue every 5 (five) minutes x 3 doses as needed for chest pain. 09/25/18   Hilty, Lisette Abu, MD  oxybutynin (DITROPAN) 5 MG tablet Take 1 tablet (5 mg total) by mouth every 8 (eight) hours as needed for bladder spasms. 08/23/21   Rene Paci, MD    Family History Family History  Problem Relation Age of Onset   Hypertension Mother    Diabetes Mother    Cancer - Other Mother    Cancer Sister    Breast cancer Daughter     Social History Social History   Tobacco Use   Smoking status: Some Days    Packs/day: 0.50    Years: 25.00    Additional pack years: 0.00    Total pack years: 12.50    Types: Cigarettes   Smokeless tobacco: Never   Tobacco comments:    Currently on the Nicoderm patch."smokes a few"  Vaping Use   Vaping Use: Never used  Substance Use Topics   Alcohol use: Yes    Comment: occasionally   Drug use: No     Allergies   Diclofenac, Codeine, Hydrocodone, and Ciprofloxacin   Review of Systems Review of Systems  Constitutional:  Negative for chills, fatigue and fever.  HENT:  Positive for dental problem. Negative for sore throat, trouble swallowing and voice change.   Respiratory:  Negative for shortness of breath.   Cardiovascular:  Negative for chest pain.  Allergic/Immunologic: Positive for immunocompromised state.     Physical Exam Triage Vital Signs ED Triage Vitals  Enc Vitals Group     BP 10/06/22 1453 (!) 145/75     Pulse Rate 10/06/22 1453 72     Resp 10/06/22 1453 18     Temp 10/06/22 1453 98.4 F (36.9 C)     Temp Source 10/06/22 1453 Oral     SpO2 10/06/22 1453 94 %     Weight 10/06/22 1452 180 lb (81.6 kg)     Height 10/06/22 1452  (1.727 m)     Head Circumference --      Peak Flow --      Pain Score 10/06/22 1449 6     Pain Loc --      Pain Edu? --      Excl. in GC? --  No data found.  Updated Vital Signs BP (!) 145/75 (BP Location: Left Arm)   Pulse 72   Temp 98.4 F (36.9 C) (Oral)    Resp 18   Ht  (1.727 m)   Wt 180 lb (81.6 kg)   SpO2 94%   BMI 27.37 kg/m   Visual Acuity Right Eye Distance:   Left Eye Distance:   Bilateral Distance:    Right Eye Near:   Left Eye Near:    Bilateral Near:     Physical Exam Vitals and nursing note reviewed.  Constitutional:      General: She is not in acute distress.    Appearance: She is well-developed.  HENT:     Head: Normocephalic and atraumatic.     Right Ear: External ear normal.     Left Ear: External ear normal.     Nose: Nose normal.     Mouth/Throat:     Mouth: Mucous membranes are moist.     Dentition: Has dentures. Gingival swelling present.     Tongue: No lesions. Tongue does not deviate from midline.     Palate: Mass present.     Pharynx: Uvula midline. No pharyngeal swelling, oropharyngeal exudate, posterior oropharyngeal erythema or uvula swelling.     Tonsils: No tonsillar exudate or tonsillar abscesses.      Comments: Patient with no upper teeth, normally wears dentures. Swelling and firmness to left upper gum line / hard palate. Without fluctuance. No active drainage or head. Uvula midline.  Eyes:     General: No scleral icterus.       Right eye: No discharge.        Left eye: No discharge.     Conjunctiva/sclera: Conjunctivae normal.  Cardiovascular:     Rate and Rhythm: Normal rate and regular rhythm.  Pulmonary:     Effort: Pulmonary effort is normal. No respiratory distress.  Musculoskeletal:        General: No swelling. Normal range of motion.  Skin:    General: Skin is warm and dry.     Capillary Refill: Capillary refill takes less than 2 seconds.  Neurological:     Mental Status: She is alert and oriented to person, place, and time.  Psychiatric:        Mood and Affect: Mood normal.        Behavior: Behavior is cooperative.      UC Treatments / Results  Labs (all labs ordered are listed, but only abnormal results are displayed) Labs Reviewed - No data to  display  EKG   Radiology No results found.  Procedures Procedures (including critical care time)  Medications Ordered in UC Medications  lidocaine (XYLOCAINE) 2 % viscous mouth solution 15 mL (15 mLs Mouth/Throat Given 10/06/22 1525)    Initial Impression / Assessment and Plan / UC Course  I have reviewed the triage vital signs and the nursing notes.  Pertinent labs & imaging results that were available during my care of the patient were reviewed by me and considered in my medical decision making (see chart for details).  Vitals in triage reviewed, patient is hemodynamically stable.  Patient with a palpable hard mass/lesion to her left upper gum and hard palate.  No obvious head or drainage.  Normally wears dentures but due to swelling and pain she has been unable to wear them.  She is a active cancer patient receiving treatment for bladder cancer.  Uvula midline, tonsils without erythema or edema, low concern for  peritonsillar abscess.  Will trial antibiotics and Magic mouthwash, advised following up with dentist on Monday.  Strict emergency and return precautions given for fever, trouble swallowing, or any worsening of condition.  Patient verbalized understanding, no questions at this time.    Final Clinical Impressions(s) / UC Diagnoses   Final diagnoses:  Pain in gums  Swelling of gums     Discharge Instructions      You do have swelling and tenderness to the roof of your mouth.  I am starting you on antibiotics, please take all antibiotics as prescribed and until finished.  You can take them with food to help prevent gastrointestinal upset.  Please also use the Magic mouthwash rinse 4 times daily as needed for up to 10 days for mouth pain.  This medication does have lidocaine in it and it can help numb the pain in your mouth.  I am unsure why you are having this dental pain, it is important to follow-up with a dentist on Monday for further evaluation.  Please seek immediate  care if you develop fever, shortness of breath, inability to swallow, or any new concerning symptoms.      ED Prescriptions     Medication Sig Dispense Auth. Provider   amoxicillin-clavulanate (AUGMENTIN) 875-125 MG tablet Take 1 tablet by mouth every 12 (twelve) hours. 14 tablet Rinaldo Ratel, [place] N, [place]   magic mouthwash (nystatin, lidocaine, diphenhydrAMINE) suspension Take 5 mLs by mouth 4 (four) times daily as needed for up to 10 days for mouth pain. 180 mL Felita Bump, [place] N, [place]      PDMP not reviewed this encounter.   Lacie Landry, [place] N, [place] 10/06/22 1537

## 2022-10-06 NOTE — ED Triage Notes (Signed)
Patient having an abscess in the upper left  gums of her mouth onset beginning this week. States using warm salt gargles with no relief.

## 2022-10-08 ENCOUNTER — Inpatient Hospital Stay: Payer: Medicare Other | Admitting: Licensed Clinical Social Worker

## 2022-10-08 DIAGNOSIS — M47816 Spondylosis without myelopathy or radiculopathy, lumbar region: Secondary | ICD-10-CM | POA: Diagnosis not present

## 2022-10-08 DIAGNOSIS — C7910 Secondary malignant neoplasm of unspecified urinary organs: Secondary | ICD-10-CM

## 2022-10-08 NOTE — Progress Notes (Signed)
[facility] CSW Progress Note  Clinical Child psychotherapist contacted patient by phone to discuss questions pt had regarding [facility].  Pt spoke w/ [facility] to register and received an email in response that she has been unable to print.  CSW provided pt w/ email details.  Pt to forward email to CSW to print.  Once received CSW to leave in an envelope at registration for pt to pick up.    Rachel Moulds, LCSW

## 2022-10-09 ENCOUNTER — Inpatient Hospital Stay: Payer: Medicare Other | Admitting: Dietician

## 2022-10-09 ENCOUNTER — Telehealth: Payer: Self-pay | Admitting: Dietician

## 2022-10-09 DIAGNOSIS — G4733 Obstructive sleep apnea (adult) (pediatric): Secondary | ICD-10-CM | POA: Diagnosis not present

## 2022-10-09 NOTE — Progress Notes (Signed)
error 

## 2022-10-09 NOTE — Telephone Encounter (Signed)
Call placed to patient for scheduled telephone appointment. Patient did not answer. Left VM with request for return call. Contact information provided.

## 2022-10-10 ENCOUNTER — Other Ambulatory Visit: Payer: Self-pay | Admitting: Physician Assistant

## 2022-10-10 DIAGNOSIS — D649 Anemia, unspecified: Secondary | ICD-10-CM

## 2022-10-10 MED FILL — Dexamethasone Sodium Phosphate Inj 100 MG/10ML: INTRAMUSCULAR | Qty: 1 | Status: AC

## 2022-10-10 MED FILL — Fosaprepitant Dimeglumine For IV Infusion 150 MG (Base Eq): INTRAVENOUS | Qty: 5 | Status: AC

## 2022-10-11 ENCOUNTER — Other Ambulatory Visit: Payer: Medicare Other

## 2022-10-11 ENCOUNTER — Inpatient Hospital Stay (HOSPITAL_BASED_OUTPATIENT_CLINIC_OR_DEPARTMENT_OTHER): Payer: Medicare Other | Admitting: Internal Medicine

## 2022-10-11 ENCOUNTER — Encounter: Payer: Self-pay | Admitting: Internal Medicine

## 2022-10-11 ENCOUNTER — Encounter: Payer: Medicare Other | Admitting: Nutrition

## 2022-10-11 ENCOUNTER — Ambulatory Visit: Payer: Medicare Other

## 2022-10-11 ENCOUNTER — Inpatient Hospital Stay: Payer: Medicare Other

## 2022-10-11 ENCOUNTER — Inpatient Hospital Stay: Payer: Medicare Other | Admitting: Nutrition

## 2022-10-11 ENCOUNTER — Other Ambulatory Visit: Payer: Self-pay

## 2022-10-11 ENCOUNTER — Encounter: Payer: Self-pay | Admitting: Medical Oncology

## 2022-10-11 DIAGNOSIS — R5383 Other fatigue: Secondary | ICD-10-CM | POA: Diagnosis not present

## 2022-10-11 DIAGNOSIS — C7800 Secondary malignant neoplasm of unspecified lung: Secondary | ICD-10-CM | POA: Diagnosis not present

## 2022-10-11 DIAGNOSIS — Z881 Allergy status to other antibiotic agents status: Secondary | ICD-10-CM | POA: Diagnosis not present

## 2022-10-11 DIAGNOSIS — M2550 Pain in unspecified joint: Secondary | ICD-10-CM | POA: Diagnosis not present

## 2022-10-11 DIAGNOSIS — Z79899 Other long term (current) drug therapy: Secondary | ICD-10-CM | POA: Diagnosis not present

## 2022-10-11 DIAGNOSIS — Z885 Allergy status to narcotic agent status: Secondary | ICD-10-CM | POA: Diagnosis not present

## 2022-10-11 DIAGNOSIS — Z5111 Encounter for antineoplastic chemotherapy: Secondary | ICD-10-CM | POA: Diagnosis not present

## 2022-10-11 DIAGNOSIS — Z79630 Long term (current) use of alkylating agent: Secondary | ICD-10-CM | POA: Diagnosis not present

## 2022-10-11 DIAGNOSIS — D649 Anemia, unspecified: Secondary | ICD-10-CM

## 2022-10-11 DIAGNOSIS — C679 Malignant neoplasm of bladder, unspecified: Secondary | ICD-10-CM | POA: Diagnosis not present

## 2022-10-11 DIAGNOSIS — Z5189 Encounter for other specified aftercare: Secondary | ICD-10-CM | POA: Diagnosis not present

## 2022-10-11 DIAGNOSIS — C7910 Secondary malignant neoplasm of unspecified urinary organs: Secondary | ICD-10-CM

## 2022-10-11 DIAGNOSIS — Z95828 Presence of other vascular implants and grafts: Secondary | ICD-10-CM

## 2022-10-11 DIAGNOSIS — Z79631 Long term (current) use of antimetabolite agent: Secondary | ICD-10-CM | POA: Diagnosis not present

## 2022-10-11 DIAGNOSIS — R059 Cough, unspecified: Secondary | ICD-10-CM | POA: Diagnosis not present

## 2022-10-11 DIAGNOSIS — Z7902 Long term (current) use of antithrombotics/antiplatelets: Secondary | ICD-10-CM | POA: Diagnosis not present

## 2022-10-11 DIAGNOSIS — R0609 Other forms of dyspnea: Secondary | ICD-10-CM | POA: Diagnosis not present

## 2022-10-11 LAB — SAMPLE TO BLOOD BANK

## 2022-10-11 LAB — CMP (CANCER CENTER ONLY)
ALT: 9 U/L (ref 0–44)
AST: 12 U/L — ABNORMAL LOW (ref 15–41)
Albumin: 3.9 g/dL (ref 3.5–5.0)
Alkaline Phosphatase: 157 U/L — ABNORMAL HIGH (ref 38–126)
Anion gap: 6 (ref 5–15)
BUN: 13 mg/dL (ref 8–23)
CO2: 28 mmol/L (ref 22–32)
Calcium: 9.4 mg/dL (ref 8.9–10.3)
Chloride: 104 mmol/L (ref 98–111)
Creatinine: 1.23 mg/dL — ABNORMAL HIGH (ref 0.44–1.00)
GFR, Estimated: 46 mL/min — ABNORMAL LOW (ref >60)
Glucose, Bld: 99 mg/dL (ref 70–99)
Potassium: 4.2 mmol/L (ref 3.5–5.1)
Sodium: 138 mmol/L (ref 135–145)
Total Bilirubin: 0.2 mg/dL — ABNORMAL LOW (ref 0.3–1.2)
Total Protein: 7.1 g/dL (ref 6.5–8.1)

## 2022-10-11 LAB — CBC WITH DIFFERENTIAL (CANCER CENTER ONLY)
Abs Immature Granulocytes: 0.33 10*3/uL — ABNORMAL HIGH (ref 0.00–0.07)
Basophils Absolute: 0.1 10*3/uL (ref 0.0–0.1)
Basophils Relative: 0 %
Eosinophils Absolute: 0.2 10*3/uL (ref 0.0–0.5)
Eosinophils Relative: 1 %
HCT: 35.4 % — ABNORMAL LOW (ref 36.0–46.0)
Hemoglobin: 11.8 g/dL — ABNORMAL LOW (ref 12.0–15.0)
Immature Granulocytes: 2 %
Lymphocytes Relative: 12 %
Lymphs Abs: 2.3 10*3/uL (ref 0.7–4.0)
MCH: 33.1 pg (ref 26.0–34.0)
MCHC: 33.3 g/dL (ref 30.0–36.0)
MCV: 99.4 fL (ref 80.0–100.0)
Monocytes Absolute: 1.8 10*3/uL — ABNORMAL HIGH (ref 0.1–1.0)
Monocytes Relative: 10 %
Neutro Abs: 13.9 10*3/uL — ABNORMAL HIGH (ref 1.7–7.7)
Neutrophils Relative %: 75 %
Platelet Count: 130 10*3/uL — ABNORMAL LOW (ref 150–400)
RBC: 3.56 MIL/uL — ABNORMAL LOW (ref 3.87–5.11)
RDW: 19.5 % — ABNORMAL HIGH (ref 11.5–15.5)
WBC Count: 18.6 10*3/uL — ABNORMAL HIGH (ref 4.0–10.5)
nRBC: 0 % (ref 0.0–0.2)

## 2022-10-11 MED ORDER — SODIUM CHLORIDE 0.9% FLUSH
10.0000 mL | INTRAVENOUS | Status: DC | PRN
  Administered 2022-10-11: 10 mL

## 2022-10-11 MED ORDER — SODIUM CHLORIDE 0.9 % IV SOLN
10.0000 mg | Freq: Once | INTRAVENOUS | Status: AC
  Administered 2022-10-11: 10 mg via INTRAVENOUS
  Filled 2022-10-11: qty 10

## 2022-10-11 MED ORDER — HEPARIN SOD (PORK) LOCK FLUSH 100 UNIT/ML IV SOLN
500.0000 [IU] | Freq: Once | INTRAVENOUS | Status: AC | PRN
  Administered 2022-10-11: 500 [IU]

## 2022-10-11 MED ORDER — SODIUM CHLORIDE 0.9 % IV SOLN: Freq: Once | INTRAVENOUS | Status: AC

## 2022-10-11 MED ORDER — SODIUM CHLORIDE 0.9 % IV SOLN
150.0000 mg | Freq: Once | INTRAVENOUS | Status: AC
  Administered 2022-10-11: 150 mg via INTRAVENOUS
  Filled 2022-10-11: qty 150

## 2022-10-11 MED ORDER — SODIUM CHLORIDE 0.9% FLUSH
10.0000 mL | Freq: Once | INTRAVENOUS | Status: AC
  Administered 2022-10-11: 10 mL

## 2022-10-11 MED ORDER — SODIUM CHLORIDE 0.9 % IV SOLN
800.0000 mg/m2 | Freq: Once | INTRAVENOUS | Status: AC
  Administered 2022-10-11: 1634 mg via INTRAVENOUS
  Filled 2022-10-11: qty 42.97

## 2022-10-11 MED ORDER — PALONOSETRON HCL INJECTION 0.25 MG/5ML
0.2500 mg | Freq: Once | INTRAVENOUS | Status: AC
  Administered 2022-10-11: 0.25 mg via INTRAVENOUS
  Filled 2022-10-11: qty 5

## 2022-10-11 MED ORDER — SODIUM CHLORIDE 0.9 % IV SOLN
302.4000 mg | Freq: Once | INTRAVENOUS | Status: AC
  Administered 2022-10-11: 300 mg via INTRAVENOUS
  Filled 2022-10-11: qty 30

## 2022-10-11 NOTE — Progress Notes (Signed)
[facility] [phone]   [phone]) [phone]  OFFICE PROGRESS NOTE  [name], MD [address] [place] [address]  DIAGNOSIS: Metastatic urothelial carcinoma that was initially diagnosed as stage II (T2, N0, M0) in September 2022 status post right nephroureterectomy as well as intravesical treatment and resection of superficial tumor in the bladder several times and March 2023 as well as July 2023.  The patient was found to have enlarging and new pulmonary nodules consistent with metastatic disease in November 2023.   PRIOR THERAPY: None  CURRENT THERAPY: Palliative systemic chemotherapy with carboplatin for AUC of 5 on day 1 and gemcitabine 1000 mg/M2 on days 1 and 8 every 3 weeks. First cycle June 21, 2022. Status post 5 cycles. Her dose of carboplatin was reduced to AUC of 4 and gemcitabine to 800 mg/m2 starting from cycle #5 due to cytopenias.   INTERVAL HISTORY: Morgan Torres 72 y.o. female returns to the clinic today for follow-up visit.  The patient is feeling fine today with no concerning complaints except for mild fatigue but she has more energy last week.  She denied having any current chest pain, shortness of breath but has mild cough with no hemoptysis.  She has no nausea, vomiting, diarrhea or constipation.  She has no headache or visual changes.  She did not take her blood pressure medication earlier today because she was in a rush to come to the clinic today.  She is here today for evaluation before starting cycle #6 of her treatment.  She has a lot of aching pain after the Neulasta injection.  MEDICAL HISTORY: Past Medical History:  Diagnosis Date   Anxiety    Arthritis    hip, lumbar spine    Asthma    seasonal allergies    Bronchitis    Hx: of   Chronic kidney disease    COPD (chronic obstructive pulmonary disease)    Coronary artery disease    Depression    Fibromyalgia    Hypertension    Lumbar herniated disc     Myocardial infarction 06/25/2012   followed by Dr. Rennis Golden, treated medically, no stents   Peripheral arterial disease    of right foot   Pneumonia    Hx: of yrs ago   Sciatica    Sleep apnea    Small bowel obstruction    several yrs ago    ALLERGIES:  is allergic to diclofenac, codeine, hydrocodone, and ciprofloxacin.  MEDICATIONS:  Current Outpatient Medications  Medication Sig Dispense Refill   acetaminophen (TYLENOL) 500 MG tablet Take 2 tablets (1,000 mg total) by mouth every 6 (six) hours as needed for mild pain. 30 tablet 0   albuterol (PROVENTIL HFA;VENTOLIN HFA) 108 (90 Base) MCG/ACT inhaler Inhale 2 puffs into the lungs every 6 (six) hours as needed for wheezing or shortness of breath. 1 Inhaler 0   amLODipine (NORVASC) 5 MG tablet TAKE 1 TABLET BY MOUTH EVERY DAY 90 tablet 0   amoxicillin-clavulanate (AUGMENTIN) 875-125 MG tablet Take 1 tablet by mouth every 12 (twelve) hours. 14 tablet 0   aspirin EC 81 MG tablet Take 81 mg by mouth daily. Swallow whole.     atorvastatin (LIPITOR) 20 MG tablet Take 20 mg by mouth. Monday wed and Friday in am     cilostazol (PLETAL) 100 MG tablet Take 100 mg by mouth 2 (two) times daily.     EPINEPHrine (EPIPEN 2-PAK) 0.3 mg/0.3 mL DEVI  Inject 0.3 mLs (0.3 mg total) into the muscle once as needed (for severe allergic reaction). CAll 911 immediately if you have to use this medicine 1 Device 1   lidocaine-prilocaine (EMLA) cream Apply to the Port-A-Cath site 30-60-minute before treatment 30 g 2   LORazepam (ATIVAN) 0.5 MG tablet Take 0.5 mg by mouth daily as needed for anxiety.     losartan-hydrochlorothiazide (HYZAAR) 100-12.5 MG tablet Take 1 tablet by mouth daily.     magic mouthwash (nystatin, lidocaine, diphenhydrAMINE) suspension Take 5 mLs by mouth 4 (four) times daily as needed for up to 10 days for mouth pain. 180 mL 0   magic mouthwash (nystatin, lidocaine, diphenhydrAMINE) suspension Take 5 mLs by mouth 4 (four) times daily as needed  for up to 10 days for mouth pain. 180 mL 0   metoprolol tartrate (LOPRESSOR) 25 MG tablet Take 1 tablet (25 mg total) by mouth 2 (two) times daily. Call and schedule follow up appt to receive further refills. 2nd attempt. [phone] 30 tablet 0   montelukast (SINGULAIR) 10 MG tablet Take 10 mg by mouth at bedtime.     nicotine (NICODERM CQ - DOSED IN MG/24 HOURS) 21 mg/24hr patch Place 21 mg onto the skin daily.     nicotine polacrilex (NICORETTE) 4 MG gum Take 4 mg by mouth as needed for smoking cessation.     nitroGLYCERIN (NITROSTAT) 0.4 MG SL tablet Place 1 tablet (0.4 mg total) under the tongue every 5 (five) minutes x 3 doses as needed for chest pain. 25 tablet 2   oxybutynin (DITROPAN) 5 MG tablet Take 1 tablet (5 mg total) by mouth every 8 (eight) hours as needed for bladder spasms. 30 tablet 1   polyethylene glycol (MIRALAX / GLYCOLAX) 17 g packet Take 17 g by mouth as needed.     prochlorperazine (COMPAZINE) 10 MG tablet Take 1 tablet (10 mg total) by mouth every 6 (six) hours as needed for nausea or vomiting. 30 tablet 0   traMADol (ULTRAM) 50 MG tablet Take by mouth every 6 (six) hours as needed. Not taking     No current facility-administered medications for this visit.    SURGICAL HISTORY:  Past Surgical History:  Procedure Laterality Date   ABDOMINAL SURGERY     resection of small intestine   ANTERIOR CRUCIATE LIGAMENT REPAIR Bilateral    BACK SURGERY     fusion of lumbar   CARDIAC CATHETERIZATION  06/25/2012   COLON SURGERY     resection for bowel - for obstruction  6 to 7 yrs ago per pt on 01-05-2022   COLONOSCOPY     Hx; of   DILATION AND CURETTAGE OF UTERUS     EYE SURGERY Bilateral    cataracts removed   HERNIA REPAIR  02/23/1969   inguinal hernia    IR IMAGING GUIDED PORT INSERTION  06/07/2022   LEFT HEART CATHETERIZATION WITH CORONARY ANGIOGRAM N/A 03/30/2013   Procedure: LEFT HEART CATHETERIZATION WITH CORONARY ANGIOGRAM;  Surgeon: Marykay Lex, MD;   Location: [facility] CATH LAB;  Service: Cardiovascular;  Laterality: N/A;   left thumb surgery     yrs ago   ROBOT ASSITED LAPAROSCOPIC NEPHROURETERECTOMY Left 03/09/2021   Procedure: XI ROBOT ASSITED LAPAROSCOPIC NEPHROURETERECTOMY/ CYSTOSCOPY WITH LEFT URETEROSCOPY WITH TRANSURETHRAL RESECTION OF URETERAL ORIFICE;  Surgeon: Rene Paci, MD;  Location: [facility] ORS;  Service: Urology;  Laterality: Left;   SALIVARY GLAND SURGERY Left    approached from inside mouth & side of neck 1970-1980  TONSILLECTOMY     as child   TOTAL HIP ARTHROPLASTY Left 05/28/2014   Procedure: LEFT TOTAL HIP ARTHROPLASTY;  Surgeon: Thera Flake., MD;  Location: [facility] OR;  Service: Orthopedics;  Laterality: Left;   TRANSURETHRAL RESECTION OF BLADDER TUMOR N/A 08/23/2021   Procedure: TRANSURETHRAL RESECTION OF BLADDER TUMOR (TURBT) WITH CYSTOSCOPY/ POSTOPERATIVE INSTILLATION OF GEMCITABINE/retrograde pyelogram and stent placement (right);  Surgeon: Rene Paci, MD;  Location: [facility];  Service: Urology;  Laterality: N/A;   TRANSURETHRAL RESECTION OF BLADDER TUMOR N/A 01/10/2022   Procedure: TRANSURETHRAL RESECTION OF BLADDER TUMOR (TURBT) WITH CYSTOSCOPY / GEMCITABINE INSTILLATION post-operatively;  Surgeon: Rene Paci, MD;  Location: [facility];  Service: Urology;  Laterality: N/A;  ONLY NEEDS 30 MIN    REVIEW OF SYSTEMS:  A comprehensive review of systems was negative except for: Constitutional: positive for fatigue Respiratory: positive for dyspnea on exertion Musculoskeletal: positive for arthralgias   PHYSICAL EXAMINATION: General appearance: alert, cooperative, fatigued, and no distress Head: Normocephalic, without obvious abnormality, atraumatic Neck: no adenopathy, no JVD, supple, symmetrical, trachea midline, and thyroid not enlarged, symmetric, no tenderness/mass/nodules Lymph nodes: Cervical, supraclavicular, and axillary nodes normal. Resp:  clear to auscultation bilaterally Back: symmetric, no curvature. ROM normal. No CVA tenderness. Cardio: regular rate and rhythm, S1, S2 normal, no murmur, click, rub or gallop GI: soft, non-tender; bowel sounds normal; no masses,  no organomegaly Extremities: extremities normal, atraumatic, no cyanosis or edema  ECOG PERFORMANCE STATUS: 1 - Symptomatic but completely ambulatory  Blood pressure (!) 178/85, pulse 87, temperature 98.6 F (37 C), temperature source Oral, resp. rate 16, weight 180 lb 14.4 oz (82.1 kg), SpO2 99 %.  LABORATORY DATA: Lab Results  Component Value Date   WBC 18.6 (H) 10/11/2022   HGB 11.8 (L) 10/11/2022   HCT 35.4 (L) 10/11/2022   MCV 99.4 10/11/2022   PLT 130 (L) 10/11/2022      Chemistry      Component Value Date/Time   NA 135 10/04/2022 1350   K 3.9 10/04/2022 1350   CL 100 10/04/2022 1350   CO2 29 10/04/2022 1350   BUN 23 10/04/2022 1350   CREATININE 1.57 (H) 10/04/2022 1350      Component Value Date/Time   CALCIUM 9.1 10/04/2022 1350   ALKPHOS 147 (H) 10/04/2022 1350   AST 12 (L) 10/04/2022 1350   ALT 10 10/04/2022 1350   BILITOT 0.2 (L) 10/04/2022 1350       RADIOGRAPHIC STUDIES: No results found.  ASSESSMENT AND PLAN: This is a very pleasant 72 years old African-American female with Metastatic urothelial carcinoma that was initially diagnosed as stage II (T2, N0, M0) in September 2022 status post right nephroureterectomy as well as intravesical treatment and resection of superficial tumor in the bladder several times and March 2023 as well as July 2023.  The patient was found to have enlarging and new pulmonary nodules consistent with metastatic disease in November 2023.  I will send her tissue biopsy for molecular studies by foundation 1 as well as PD-L1 expression. The patient is currently undergoing systemic chemotherapy with carboplatin for AUC of 5 on day 1 and gemcitabine 1000 mg/M2 on days 1 and 8 every 3 weeks.  First dose June 21, 2022.  Status post 5 cycles.  Starting from cycle #5 her dose of carboplatin was reduced to AUC of 4 and gemcitabine to 800 Mg/M2 IV.  The patient also received Neulasta injection after day 8 of her treatment.  She has been tolerating the treatment well except for the aching pain from the Neulasta injection. I recommended for her to proceed with cycle #6 today as planned. I will see her back for follow-up visit in 3-4 weeks for evaluation and repeat CT scan of the chest, abdomen and pelvis for restaging of her disease. For the anemia, she received 2 units of PRBCs transfusion recently and she is feeling much better. The patient was advised to call immediately if she has any other concerning symptoms in the interval. The patient voices understanding of current disease status and treatment options and is in agreement with the current care plan.  All questions were answered. The patient knows to call the clinic with any problems, questions or concerns. We can certainly see the patient much sooner if necessary.  The total time spent in the appointment was 20 minutes.  Disclaimer: This note was dictated with voice recognition software. Similar sounding words can inadvertently be transcribed and may not be corrected upon review.

## 2022-10-11 NOTE — Progress Notes (Signed)
Nutrition consult for 72 year old female   DIAGNOSIS: Metastatic urothelial carcinoma that was initially diagnosed as stage II (T2, N0, M0) in September 2022 status post right nephroureterectomy as well as intravesical treatment and resection of superficial tumor in the bladder several times and March 2023 as well as July 2023.  The patient was found to have enlarging and new pulmonary nodules consistent with metastatic disease in November 2023.    PRIOR THERAPY: None   CURRENT THERAPY: Palliative systemic chemotherapy with carboplatin for AUC of 5 on day 1 and gemcitabine 1000 mg/M2 on days 1 and 8 every 3 weeks. First cycle June 21, 2022. Status post 5 cycles. Her dose of carboplatin was reduced to AUC of 4 and gemcitabine to 800 mg/m2 starting from cycle #5 due to cytopenias.   Past medical history includes anxiety, chronic kidney disease, COPD, coronary artery disease, depression, hypertension, MI, small bowel obstruction.  Medications include Ativan, Magic mouthwash, Mira LAX, and Compazine.  Height: 5 feet 8 inches. Weight: 180 pounds. Patient weighed 191 pounds 8 ounces February 15 BMI: 27.38.  Patient reports that after she gets injection (of Neulasta she thinks) she has a very poor appetite and is unable to eat much of anything.  She can drink some Ensure supplements during this time.  Reports she then develops constipation and taste alterations.  Nutrition diagnosis: Unintended weight loss related to cancer and associated treatments as evidenced by 6% weight loss in less than 3 months.  Intervention: Educated to consume smaller more frequent meals and snacks. Goal is weight maintenance. Educated on strategies for improving taste alterations and constipation. Encouraged Ensure Plus or Ensure complete to supplement poor appetite.  Provided coupons.  Encouraged baking soda and salt water gargle. Encouraged bowel regimen. Provided nutrition fact sheets.  Monitoring, evaluation,  goals: Patient will tolerate increased calories and protein to minimize weight loss.  Next visit: Thursday, April 25 during infusion.  **Disclaimer: This note was dictated with voice recognition software. Similar sounding words can inadvertently be transcribed and this note may contain transcription errors which may not have been corrected upon publication of note.**

## 2022-10-11 NOTE — Patient Instructions (Signed)
[facility] [facility]  Discharge Instructions: Thank you for choosing [facility] to provide your oncology and hematology care.   If you have a lab appointment with the Cancer Center, please go directly to the Cancer Center and check in at the registration area.   Wear comfortable clothing and clothing appropriate for easy access to any Portacath or PICC line.   We strive to give you quality time with your provider. You may need to reschedule your appointment if you arrive late (15 or more minutes).  Arriving late affects you and other patients whose appointments are after yours.  Also, if you miss three or more appointments without notifying the office, you may be dismissed from the clinic at the provider's discretion.      For prescription refill requests, have your pharmacy contact our office and allow 72 hours for refills to be completed.    Today you received the following chemotherapy and/or immunotherapy agents Gemzar, Carboplatin      To help prevent nausea and vomiting after your treatment, we encourage you to take your nausea medication as directed.  BELOW ARE SYMPTOMS THAT SHOULD BE REPORTED IMMEDIATELY: *FEVER GREATER THAN 100.4 F (38 C) OR HIGHER *CHILLS OR SWEATING *NAUSEA AND VOMITING THAT IS NOT CONTROLLED WITH YOUR NAUSEA MEDICATION *UNUSUAL SHORTNESS OF BREATH *UNUSUAL BRUISING OR BLEEDING *URINARY PROBLEMS (pain or burning when urinating, or frequent urination) *BOWEL PROBLEMS (unusual diarrhea, constipation, pain near the anus) TENDERNESS IN MOUTH AND THROAT WITH OR WITHOUT PRESENCE OF ULCERS (sore throat, sores in mouth, or a toothache) UNUSUAL RASH, SWELLING OR PAIN  UNUSUAL VAGINAL DISCHARGE OR ITCHING   Items with * indicate a potential emergency and should be followed up as soon as possible or go to the Emergency Department if any problems should occur.  Please show the CHEMOTHERAPY ALERT CARD or IMMUNOTHERAPY ALERT CARD  at check-in to the Emergency Department and triage nurse.  Should you have questions after your visit or need to cancel or reschedule your appointment, please contact [facility]  Dept: [phone]  and follow the prompts.  Office hours are 8:00 a.m. to 4:30 p.m. Monday - Friday. Please note that voicemails left after 4:00 p.m. may not be returned until the following business day.  We are closed weekends and major holidays. You have access to a nurse at all times for urgent questions. Please call the main number to the clinic Dept: [phone] and follow the prompts.   For any non-urgent questions, you may also contact your provider using MyChart. We now offer e-Visits for anyone 44 and older to request care online for non-urgent symptoms. For details visit [email or web address].   Also download the MyChart app! Go to the app store, search "MyChart", open the app, select [facility], and log in with your MyChart username and password.

## 2022-10-11 NOTE — Progress Notes (Unsigned)
Patient seen by Dr. Gypsy Balsam are within treatment parameters.  Labs reviewed: and are within treatment parameters.  Per physician team, patient is ready for treatment and there are NO modifications to the treatment plan.   forgot to take her BP medication today

## 2022-10-13 ENCOUNTER — Other Ambulatory Visit: Payer: Self-pay

## 2022-10-13 ENCOUNTER — Ambulatory Visit: Payer: Medicare Other

## 2022-10-14 ENCOUNTER — Ambulatory Visit (HOSPITAL_COMMUNITY)
Admission: EM | Admit: 2022-10-14 | Discharge: 2022-10-14 | Disposition: A | Discharge status: Home / Self Care | Payer: Medicare Other | Attending: Emergency Medicine | Admitting: Emergency Medicine

## 2022-10-14 ENCOUNTER — Encounter (HOSPITAL_COMMUNITY): Payer: Self-pay

## 2022-10-14 ENCOUNTER — Ambulatory Visit (INDEPENDENT_AMBULATORY_CARE_PROVIDER_SITE_OTHER): Payer: Medicare Other

## 2022-10-14 DIAGNOSIS — S99912A Unspecified injury of left ankle, initial encounter: Secondary | ICD-10-CM

## 2022-10-14 DIAGNOSIS — M7989 Other specified soft tissue disorders: Secondary | ICD-10-CM | POA: Diagnosis not present

## 2022-10-14 MED ORDER — ACETAMINOPHEN 500 MG PO TABS: 500.0000 mg | ORAL_TABLET | Freq: Four times a day (QID) | ORAL | 0 refills | Status: AC | PRN

## 2022-10-14 NOTE — Discharge Instructions (Addendum)
Xray is negative  I recommend to use ice on the area, 10-15 minutes, several times daily. Elevate the leg to reduce swelling. Use the ace wrap for compression and support Use tylenol every 4-6 hours for pain and swelling  Please follow with your orthopedic specialist if symptoms persist or worsen

## 2022-10-14 NOTE — ED Provider Notes (Signed)
[facility]    CSN: [id number] Arrival date & time: 10/14/22  1718      History   Chief Complaint Chief Complaint  Patient presents with   Fall    HPI Morgan Torres is a 72 y.o. female.  Yesterday afternoon she stood up and did not know her foot was asleep.  She put weight on it and twisted the left ankle.  Reports she fell landing on both knees.  Did not hit her head or lose consciousness.  Does not take blood thinner Reporting pain and swelling in the left ankle. No pain in knees or hips. Denies prior injury of the ankle  She has metastatic bladder cancer, currently undergoing treatment  Past Medical History:  Diagnosis Date   Anxiety    Arthritis    hip, lumbar spine    Asthma    seasonal allergies    Bronchitis    Hx: of   Chronic kidney disease    COPD (chronic obstructive pulmonary disease)    Coronary artery disease    Depression    Fibromyalgia    Hypertension    Lumbar herniated disc    Myocardial infarction 06/25/2012   followed by Dr. Rennis Golden, treated medically, no stents   Peripheral arterial disease    of right foot   Pneumonia    Hx: of yrs ago   Sciatica    Sleep apnea    Small bowel obstruction    several yrs ago    Patient Active Problem List   Diagnosis Date Noted   Port-A-Cath in place 07/12/2022   Metastatic urothelial carcinoma 05/30/2022   Bladder cancer metastasized to lung 05/30/2022   Encounter for antineoplastic chemotherapy 05/30/2022   Renal mass 03/09/2021   Morbid obesity, unspecified obesity type 08/28/2016   Non-restorative sleep 08/28/2016   Snoring 08/28/2016   Hip pain 06/03/2014   Acute blood loss anemia 06/01/2014   Osteoarthritis of left hip 05/28/2014   Obesity (BMI 30.0-34.9) 04/20/2013   Nonischemic cardiomyopathy 04/01/2013   Smoking 04/01/2013   NSTEMI - Troponin pk 1.5 with ant TWI on EKG 03/29/2013   Spinal stenosis, lumbar region, with neurogenic claudication 01/23/2013   VAGINAL  TRICHOMONIASIS 06/28/2006   Hyperlipidemia 06/28/2006   ANEMIA-NOS 06/28/2006   ANXIETY 06/28/2006   DEPRESSION 06/28/2006   SYNDROME, CARPAL TUNNEL 06/28/2006   Essential hypertension 06/28/2006   ALLERGIC RHINITIS 06/28/2006   ASTHMA 06/28/2006   PAROTIDITIS 06/28/2006   LOW BACK PAIN 06/28/2006   TB SKIN TEST, POSITIVE, HX OF 06/28/2006    Past Surgical History:  Procedure Laterality Date   ABDOMINAL SURGERY     resection of small intestine   ANTERIOR CRUCIATE LIGAMENT REPAIR Bilateral    BACK SURGERY     fusion of lumbar   CARDIAC CATHETERIZATION  06/25/2012   COLON SURGERY     resection for bowel - for obstruction  6 to 7 yrs ago per pt on 01-05-2022   COLONOSCOPY     Hx; of   DILATION AND CURETTAGE OF UTERUS     EYE SURGERY Bilateral    cataracts removed   HERNIA REPAIR  02/23/1969   inguinal hernia    IR IMAGING GUIDED PORT INSERTION  06/07/2022   LEFT HEART CATHETERIZATION WITH CORONARY ANGIOGRAM N/A 03/30/2013   Procedure: LEFT HEART CATHETERIZATION WITH CORONARY ANGIOGRAM;  Surgeon: Marykay Lex, MD;  Location: [facility] CATH LAB;  Service: Cardiovascular;  Laterality: N/A;   left thumb surgery     yrs ago  ROBOT ASSITED LAPAROSCOPIC NEPHROURETERECTOMY Left 03/09/2021   Procedure: XI ROBOT ASSITED LAPAROSCOPIC NEPHROURETERECTOMY/ CYSTOSCOPY WITH LEFT URETEROSCOPY WITH TRANSURETHRAL RESECTION OF URETERAL ORIFICE;  Surgeon: Rene Paci, MD;  Location: [facility];  Service: Urology;  Laterality: Left;   SALIVARY GLAND SURGERY Left    approached from inside mouth & side of neck 1970-1980   TONSILLECTOMY     as child   TOTAL HIP ARTHROPLASTY Left 05/28/2014   Procedure: LEFT TOTAL HIP ARTHROPLASTY;  Surgeon: Thera Flake., MD;  Location: [facility];  Service: Orthopedics;  Laterality: Left;   TRANSURETHRAL RESECTION OF BLADDER TUMOR N/A 08/23/2021   Procedure: TRANSURETHRAL RESECTION OF BLADDER TUMOR (TURBT) WITH CYSTOSCOPY/ POSTOPERATIVE INSTILLATION OF  GEMCITABINE/retrograde pyelogram and stent placement (right);  Surgeon: Rene Paci, MD;  Location: [facility];  Service: Urology;  Laterality: N/A;   TRANSURETHRAL RESECTION OF BLADDER TUMOR N/A 01/10/2022   Procedure: TRANSURETHRAL RESECTION OF BLADDER TUMOR (TURBT) WITH CYSTOSCOPY / GEMCITABINE INSTILLATION post-operatively;  Surgeon: Rene Paci, MD;  Location: [facility];  Service: Urology;  Laterality: N/A;  ONLY NEEDS 30 MIN    OB History   No obstetric history on file.      Home Medications    Prior to Admission medications   Medication Sig Start Date End Date Taking? Authorizing Provider  albuterol (PROVENTIL HFA;VENTOLIN HFA) 108 (90 Base) MCG/ACT inhaler Inhale 2 puffs into the lungs every 6 (six) hours as needed for wheezing or shortness of breath. 05/10/16  Yes Amyot, Ali Lowe, NP  amLODipine (NORVASC) 5 MG tablet TAKE 1 TABLET BY MOUTH EVERY DAY 08/15/22  Yes Hilty, Lisette Abu, MD  amoxicillin-clavulanate (AUGMENTIN) 875-125 MG tablet Take 1 tablet by mouth every 12 (twelve) hours. 10/06/22  Yes Rinaldo Ratel, [place] N, FNP  aspirin EC 81 MG tablet Take 81 mg by mouth daily. Swallow whole.   Yes [provider]  atorvastatin (LIPITOR) 20 MG tablet Take 20 mg by mouth. Monday wed and Friday in am 11/25/20  Yes [provider]  cilostazol (PLETAL) 100 MG tablet Take 100 mg by mouth 2 (two) times daily.   Yes [provider]  LORazepam (ATIVAN) 0.5 MG tablet Take 0.5 mg by mouth daily as needed for anxiety.   Yes [provider]  losartan-hydrochlorothiazide (HYZAAR) 100-12.5 MG tablet Take 1 tablet by mouth daily.   Yes [provider]  magic mouthwash (nystatin, lidocaine, diphenhydrAMINE) suspension Take 5 mLs by mouth 4 (four) times daily as needed for up to 10 days for mouth pain. 10/06/22 10/16/22 Yes Lamptey, Britta Mccreedy, MD  metoprolol tartrate (LOPRESSOR) 25 MG tablet Take 1 tablet  (25 mg total) by mouth 2 (two) times daily. Call and schedule follow up appt to receive further refills. 2nd attempt. [phone] 08/27/22  Yes Little Ishikawa, MD  nicotine (NICODERM CQ - DOSED IN MG/24 HOURS) 21 mg/24hr patch Place 21 mg onto the skin daily.   Yes [provider]  nicotine polacrilex (NICORETTE) 4 MG gum Take 4 mg by mouth as needed for smoking cessation.   Yes [provider]  oxybutynin (DITROPAN) 5 MG tablet Take 1 tablet (5 mg total) by mouth every 8 (eight) hours as needed for bladder spasms. 08/23/21  Yes Rene Paci, MD  prochlorperazine (COMPAZINE) 10 MG tablet Take 1 tablet (10 mg total) by mouth every 6 (six) hours as needed for nausea or vomiting. 06/21/22  Yes Si Gaul, MD  traMADol (ULTRAM) 50 MG tablet Take by  mouth every 6 (six) hours as needed. Not taking   Yes [provider]  acetaminophen (TYLENOL) 500 MG tablet Take 1 tablet (500 mg total) by mouth every 6 (six) hours as needed for mild pain. 10/14/22   Abryana Lykens, Lurena Joiner, PA-C  EPINEPHrine (EPIPEN 2-PAK) 0.3 mg/0.3 mL DEVI Inject 0.3 mLs (0.3 mg total) into the muscle once as needed (for severe allergic reaction). CAll 911 immediately if you have to use this medicine 11/26/12   Piepenbrink, Victorino Dike, PA-C  lidocaine-prilocaine (EMLA) cream Apply to the Port-A-Cath site 30-60-minute before treatment 08/14/22   Heilingoetter, Cassandra L, PA-C  nitroGLYCERIN (NITROSTAT) 0.4 MG SL tablet Place 1 tablet (0.4 mg total) under the tongue every 5 (five) minutes x 3 doses as needed for chest pain. 09/25/18   Hilty, Lisette Abu, MD  polyethylene glycol (MIRALAX / GLYCOLAX) 17 g packet Take 17 g by mouth as needed.    [provider]    Family History Family History  Problem Relation Age of Onset   Hypertension Mother    Diabetes Mother    Cancer - Other Mother    Cancer Sister    Breast cancer Daughter     Social History Social History   Tobacco Use   Smoking  status: Some Days    Packs/day: 0.50    Years: 25.00    Additional pack years: 0.00    Total pack years: 12.50    Types: Cigarettes   Smokeless tobacco: Never   Tobacco comments:    Currently on the Nicoderm patch."smokes a few"  Vaping Use   Vaping Use: Never used  Substance Use Topics   Alcohol use: Yes    Comment: occasionally   Drug use: No     Allergies   Diclofenac, Codeine, Hydrocodone, and Ciprofloxacin   Review of Systems Review of Systems As per HPI  Physical Exam Triage Vital Signs ED Triage Vitals  Enc Vitals Group     BP --      Pulse --      Resp --      Temp --      Temp src --      SpO2 --      Weight 10/14/22 1749 180 lb (81.6 kg)     Height 10/14/22 1749 5\' 8"  (1.727 m)     Head Circumference --      Peak Flow --      Pain Score 10/14/22 1748 4     Pain Loc --      Pain Edu? --      Excl. in GC? --    No data found.  Updated Vital Signs BP 107/62 (BP Location: Left Arm)   Pulse 73   Temp 98.3 F (36.8 C) (Oral)   Resp 18   Ht 5\' 8"  (1.727 m)   Wt 180 lb (81.6 kg)   SpO2 96%   BMI 27.37 kg/m     Physical Exam Vitals and nursing note reviewed.  Constitutional:      General: She is not in acute distress.    Appearance: Normal appearance.  Cardiovascular:     Rate and Rhythm: Normal rate and regular rhythm.     Pulses: Normal pulses.  Pulmonary:     Effort: Pulmonary effort is normal.  Musculoskeletal:        General: Tenderness present. No deformity.     Comments: Tender left ankles and midfoot. Swelling bilat ankles that is her baseline. Strong DP pulse. Good ROM of  ankle and toes. Cap refill < 2 seconds   Skin:    General: Skin is warm and dry.     Capillary Refill: Capillary refill takes less than 2 seconds.     Findings: No bruising.  Neurological:     Mental Status: She is alert and oriented to person, place, and time.     UC Treatments / Results  Labs (all labs ordered are listed, but only abnormal results are  displayed) Labs Reviewed - No data to display  EKG   Radiology DG Ankle Complete Left  Result Date: 10/14/2022 CLINICAL DATA:  Fall.  Twisted ankle.  Ankle pain. EXAM: LEFT ANKLE COMPLETE - 3 VIEW COMPARISON:  None Available. FINDINGS: Soft swelling is present over the medial malleolus. No acute fracture is present. Mild degenerative changes are present in the hindfoot. IMPRESSION: Soft tissue swelling over the medial malleolus without underlying fracture. Electronically Signed   By: Marin Roberts M.D.   On: 10/14/2022 18:11    Procedures Procedures (including critical care time)  Medications Ordered in UC Medications - No data to display  Initial Impression / Assessment and Plan / UC Course  I have reviewed the triage vital signs and the nursing notes.  Pertinent labs & imaging results that were available during my care of the patient were reviewed by me and considered in my medical decision making (see chart for details).  Left ankle xray negative Discussed RICE therapy, tylenol. ACE wrap applied in clinic.  She does have an orthopedic specialist she can follow-up with if needed. Return precautions discussed   Final Clinical Impressions(s) / UC Diagnoses   Final diagnoses:  Injury of left ankle, initial encounter     Discharge Instructions      Xray is negative  I recommend to use ice on the area, 10-15 minutes, several times daily. Elevate the leg to reduce swelling. Use the ace wrap for compression and support Use tylenol every 4-6 hours for pain and swelling  Please follow with your orthopedic specialist if symptoms persist or worsen     ED Prescriptions     Medication Sig Dispense Auth. Provider   acetaminophen (TYLENOL) 500 MG tablet Take 1 tablet (500 mg total) by mouth every 6 (six) hours as needed for mild pain. 30 tablet Dillon Livermore, Lurena Joiner, PA-C      PDMP not reviewed this encounter.   Marlow Baars, [place] 10/14/22 1610

## 2022-10-14 NOTE — ED Triage Notes (Signed)
"  I got up to answer phone, stood up, foot was asleep, and twisted ankle, left". DOI: [id number] "afternoon". Fell down on both knees when twisting ankle. No laceration. No head injury. No LOC prior or during. Now with pain/swelling (left ankle).

## 2022-10-16 ENCOUNTER — Telehealth: Payer: Self-pay | Admitting: Physician Assistant

## 2022-10-16 NOTE — Telephone Encounter (Signed)
Called patient regarding April/May appointments, patient is notified.  

## 2022-10-18 ENCOUNTER — Inpatient Hospital Stay: Payer: Medicare Other

## 2022-10-18 ENCOUNTER — Other Ambulatory Visit: Payer: Self-pay

## 2022-10-18 ENCOUNTER — Other Ambulatory Visit: Payer: Self-pay | Admitting: Medical Oncology

## 2022-10-18 ENCOUNTER — Other Ambulatory Visit: Payer: Medicare Other

## 2022-10-18 ENCOUNTER — Inpatient Hospital Stay: Payer: Medicare Other | Admitting: Nutrition

## 2022-10-18 VITALS — BP 117/53 | HR 72 | Temp 98.3°F | Resp 18 | Wt 178.5 lb

## 2022-10-18 DIAGNOSIS — C7910 Secondary malignant neoplasm of unspecified urinary organs: Secondary | ICD-10-CM

## 2022-10-18 DIAGNOSIS — C679 Malignant neoplasm of bladder, unspecified: Secondary | ICD-10-CM

## 2022-10-18 DIAGNOSIS — Z5189 Encounter for other specified aftercare: Secondary | ICD-10-CM | POA: Diagnosis not present

## 2022-10-18 DIAGNOSIS — C7800 Secondary malignant neoplasm of unspecified lung: Secondary | ICD-10-CM | POA: Diagnosis not present

## 2022-10-18 DIAGNOSIS — Z79631 Long term (current) use of antimetabolite agent: Secondary | ICD-10-CM | POA: Diagnosis not present

## 2022-10-18 DIAGNOSIS — Z79630 Long term (current) use of alkylating agent: Secondary | ICD-10-CM | POA: Diagnosis not present

## 2022-10-18 DIAGNOSIS — E860 Dehydration: Secondary | ICD-10-CM

## 2022-10-18 DIAGNOSIS — Z79899 Other long term (current) drug therapy: Secondary | ICD-10-CM | POA: Diagnosis not present

## 2022-10-18 DIAGNOSIS — R0609 Other forms of dyspnea: Secondary | ICD-10-CM | POA: Diagnosis not present

## 2022-10-18 DIAGNOSIS — M2550 Pain in unspecified joint: Secondary | ICD-10-CM | POA: Diagnosis not present

## 2022-10-18 DIAGNOSIS — R059 Cough, unspecified: Secondary | ICD-10-CM | POA: Diagnosis not present

## 2022-10-18 DIAGNOSIS — Z7902 Long term (current) use of antithrombotics/antiplatelets: Secondary | ICD-10-CM | POA: Diagnosis not present

## 2022-10-18 DIAGNOSIS — Z885 Allergy status to narcotic agent status: Secondary | ICD-10-CM | POA: Diagnosis not present

## 2022-10-18 DIAGNOSIS — R5383 Other fatigue: Secondary | ICD-10-CM | POA: Diagnosis not present

## 2022-10-18 DIAGNOSIS — Z881 Allergy status to other antibiotic agents status: Secondary | ICD-10-CM | POA: Diagnosis not present

## 2022-10-18 DIAGNOSIS — Z95828 Presence of other vascular implants and grafts: Secondary | ICD-10-CM

## 2022-10-18 DIAGNOSIS — Z5111 Encounter for antineoplastic chemotherapy: Secondary | ICD-10-CM | POA: Diagnosis not present

## 2022-10-18 DIAGNOSIS — D649 Anemia, unspecified: Secondary | ICD-10-CM

## 2022-10-18 LAB — CBC WITH DIFFERENTIAL (CANCER CENTER ONLY)
Abs Immature Granulocytes: 0.03 10*3/uL (ref 0.00–0.07)
Basophils Absolute: 0.1 10*3/uL (ref 0.0–0.1)
Basophils Relative: 1 %
Eosinophils Absolute: 0 10*3/uL (ref 0.0–0.5)
Eosinophils Relative: 0 %
HCT: 31.2 % — ABNORMAL LOW (ref 36.0–46.0)
Hemoglobin: 10.4 g/dL — ABNORMAL LOW (ref 12.0–15.0)
Immature Granulocytes: 0 %
Lymphocytes Relative: 25 %
Lymphs Abs: 2.2 10*3/uL (ref 0.7–4.0)
MCH: 33.1 pg (ref 26.0–34.0)
MCHC: 33.3 g/dL (ref 30.0–36.0)
MCV: 99.4 fL (ref 80.0–100.0)
Monocytes Absolute: 0.8 10*3/uL (ref 0.1–1.0)
Monocytes Relative: 9 %
Neutro Abs: 5.7 10*3/uL (ref 1.7–7.7)
Neutrophils Relative %: 65 %
Platelet Count: 173 10*3/uL (ref 150–400)
RBC: 3.14 MIL/uL — ABNORMAL LOW (ref 3.87–5.11)
RDW: 18.5 % — ABNORMAL HIGH (ref 11.5–15.5)
WBC Count: 8.8 10*3/uL (ref 4.0–10.5)
nRBC: 0 % (ref 0.0–0.2)

## 2022-10-18 LAB — CMP (CANCER CENTER ONLY)
ALT: 9 U/L (ref 0–44)
AST: 14 U/L — ABNORMAL LOW (ref 15–41)
Albumin: 3.9 g/dL (ref 3.5–5.0)
Alkaline Phosphatase: 122 U/L (ref 38–126)
Anion gap: 7 (ref 5–15)
BUN: 19 mg/dL (ref 8–23)
CO2: 27 mmol/L (ref 22–32)
Calcium: 9.2 mg/dL (ref 8.9–10.3)
Chloride: 103 mmol/L (ref 98–111)
Creatinine: 1.19 mg/dL — ABNORMAL HIGH (ref 0.44–1.00)
GFR, Estimated: 48 mL/min — ABNORMAL LOW (ref >60)
Glucose, Bld: 118 mg/dL — ABNORMAL HIGH (ref 70–99)
Potassium: 3.9 mmol/L (ref 3.5–5.1)
Sodium: 137 mmol/L (ref 135–145)
Total Bilirubin: 0.5 mg/dL (ref 0.3–1.2)
Total Protein: 6.7 g/dL (ref 6.5–8.1)

## 2022-10-18 LAB — SAMPLE TO BLOOD BANK

## 2022-10-18 MED ORDER — SODIUM CHLORIDE 0.9% FLUSH
10.0000 mL | Freq: Once | INTRAVENOUS | Status: AC
  Administered 2022-10-18: 10 mL

## 2022-10-18 MED ORDER — SODIUM CHLORIDE 0.9 % IV SOLN: Freq: Once | INTRAVENOUS | Status: AC

## 2022-10-18 MED ORDER — SODIUM CHLORIDE 0.9 % IV SOLN
800.0000 mg/m2 | Freq: Once | INTRAVENOUS | Status: AC
  Administered 2022-10-18: 1634 mg via INTRAVENOUS
  Filled 2022-10-18: qty 42.97

## 2022-10-18 MED ORDER — PROCHLORPERAZINE MALEATE 10 MG PO TABS
10.0000 mg | ORAL_TABLET | Freq: Once | ORAL | Status: AC
  Administered 2022-10-18: 10 mg via ORAL
  Filled 2022-10-18: qty 1

## 2022-10-18 MED ORDER — HEPARIN SOD (PORK) LOCK FLUSH 100 UNIT/ML IV SOLN
500.0000 [IU] | Freq: Once | INTRAVENOUS | Status: AC | PRN
  Administered 2022-10-18: 500 [IU]

## 2022-10-18 MED ORDER — SODIUM CHLORIDE 0.9% FLUSH
10.0000 mL | INTRAVENOUS | Status: DC | PRN
  Administered 2022-10-18: 10 mL

## 2022-10-18 MED ORDER — SODIUM CHLORIDE 0.9 % IV SOLN: INTRAVENOUS | Status: DC

## 2022-10-18 NOTE — Progress Notes (Signed)
Pt requested to receive extra IVFs today with tx and on Saturday 10/20/2022. This RN reached to Dr.Mohamed and Cassie PA-C regarding Pt's request. Per Cassie PA-C OK to administer extra IVFs today and Saturday, orders placed under [facility].

## 2022-10-18 NOTE — Patient Instructions (Addendum)
[facility]  Discharge Instructions: Thank you for choosing [facility] to provide your oncology and hematology care.   If you have a lab appointment with the Cancer Center, please go directly to the Cancer Center and check in at the registration area.   Wear comfortable clothing and clothing appropriate for easy access to any Portacath or PICC line.   We strive to give you quality time with your provider. You may need to reschedule your appointment if you arrive late (15 or more minutes).  Arriving late affects you and other patients whose appointments are after yours.  Also, if you miss three or more appointments without notifying the office, you may be dismissed from the clinic at the provider's discretion.      For prescription refill requests, have your pharmacy contact our office and allow 72 hours for refills to be completed.    Today you received the following chemotherapy and/or immunotherapy agents: Gemzar      To help prevent nausea and vomiting after your treatment, we encourage you to take your nausea medication as directed.  BELOW ARE SYMPTOMS THAT SHOULD BE REPORTED IMMEDIATELY: *FEVER GREATER THAN 100.4 F (38 C) OR HIGHER *CHILLS OR SWEATING *NAUSEA AND VOMITING THAT IS NOT CONTROLLED WITH YOUR NAUSEA MEDICATION *UNUSUAL SHORTNESS OF BREATH *UNUSUAL BRUISING OR BLEEDING *URINARY PROBLEMS (pain or burning when urinating, or frequent urination) *BOWEL PROBLEMS (unusual diarrhea, constipation, pain near the anus) TENDERNESS IN MOUTH AND THROAT WITH OR WITHOUT PRESENCE OF ULCERS (sore throat, sores in mouth, or a toothache) UNUSUAL RASH, SWELLING OR PAIN  UNUSUAL VAGINAL DISCHARGE OR ITCHING   Items with * indicate a potential emergency and should be followed up as soon as possible or go to the Emergency Department if any problems should occur.  Please show the CHEMOTHERAPY ALERT CARD or IMMUNOTHERAPY ALERT CARD at check-in  to the Emergency Department and triage nurse.  Should you have questions after your visit or need to cancel or reschedule your appointment, please contact [place] [facility]  Dept: [phone]  and follow the prompts.  Office hours are 8:00 a.m. to 4:30 p.m. Monday - Friday. Please note that voicemails left after 4:00 p.m. may not be returned until the following business day.  We are closed weekends and major holidays. You have access to a nurse at all times for urgent questions. Please call the main number to the clinic Dept: [phone] and follow the prompts.   For any non-urgent questions, you may also contact your provider using MyChart. We now offer e-Visits for anyone 52 and older to request care online for non-urgent symptoms. For details visit [email or web address].   Also download the MyChart app! Go to the app store, search "MyChart", open the app, select [facility], and log in with your MyChart username and password.  Rehydration, Older Adult  Rehydration is the replacement of fluids, salts, and minerals in the body (electrolytes) that are lost during dehydration. Dehydration is when there is not enough water or other fluids in the body. This happens when you lose more fluids than you take in. People who are age 56 or older have a higher risk of dehydration than younger adults. This is because in older age, the body: Is less able to maintain the right amount of water. Does not respond to temperature changes as well. Does not get a sense of thirst as easily or quickly. Other causes include: Not drinking enough  fluids. This can occur when you are ill, when you forget to drink, or when you are doing activities that require a lot of energy, especially in hot weather. Conditions that cause loss of water or other fluids. These include diarrhea, vomiting, sweating, or urinating a lot. Other illnesses, such as fever or infection. Certain medicines, such as  those that remove excess fluid from the body (diuretics). Symptoms of mild or moderate dehydration may include thirst, dry lips and mouth, and dizziness. Symptoms of severe dehydration may include increased heart rate, confusion, fainting, and not urinating. In severe cases, you may need to get fluids through an IV at the hospital. For mild or moderate cases, you can usually rehydrate at home by drinking certain fluids as told by your health care provider. What are the risks? Rehydration is usually safe. Taking in too much fluid (overhydration) can be a problem but is rare. Overhydration can cause an imbalance of electrolytes in the body, kidney failure, fluid in the lungs, or a decrease in salt (sodium) levels in the body. Supplies needed: You will need an oral rehydration solution (ORS) if your health care provider tells you to use one. This is a drink to treat dehydration. It can be found in pharmacies and retail stores. How to rehydrate Fluids Follow instructions from your health care provider about what to drink. The kind of fluid and the amount you should drink depend on your condition. In general, you should choose drinks that you prefer. If told by your health care provider, drink an ORS. Make an ORS by following instructions on the package. Start by drinking small amounts, about  cup (120 mL) every 5-10 minutes. Slowly increase how much you drink until you have taken in the amount recommended by your health care provider. Drink enough clear fluids to keep your urine pale yellow. If you were told to drink an ORS, finish it first, then start slowly drinking other clear fluids. Drink fluids such as: Water. This includes sparkling and flavored water. Drinking only water can lead to having too little sodium in your body (hyponatremia). Follow the advice of your health care provider. Water from ice chips you suck on. Fruit juice with water added to it(diluted). Sports drinks. Hot or cold  herbal teas. Broth-based soups. Coffee. Milk or milk products. Food Follow instructions from your health care provider about what to eat while you rehydrate. Your health care provider may recommend that you slowly begin eating regular foods in small amounts. Eat foods that contain a healthy balance of electrolytes, such as bananas, oranges, potatoes, tomatoes, and spinach. Avoid foods that are greasy or contain a lot of sugar. In some cases, you may get nutrition through a feeding tube that is passed through your nose and into your stomach (nasogastric tube, or NG tube). This may be done if you have uncontrolled vomiting or diarrhea. Drinks to avoid  Certain drinks may make dehydration worse. While you rehydrate, avoid drinking alcohol. How to tell if you are recovering from dehydration You may be getting better if: You are urinating more often than before you started rehydrating. Your urine is pale yellow. Your energy level improves. You vomit less often. You have diarrhea less often. Your appetite improves or returns to normal. You feel less dizzy or light-headed. Your skin tone and color start to look more normal. Follow these instructions at home: Take over-the-counter and prescription medicines only as told by your health care provider. Do not take sodium tablets. Doing this can  lead to having too much sodium in your body (hypernatremia). Contact a health care provider if: You continue to have symptoms of mild or moderate dehydration, such as: Thirst. Dry lips. Slightly dry mouth. Dizziness. Dark urine or less urine than usual. Muscle cramps. You continue to vomit or have diarrhea. Get help right away if: You have symptoms of dehydration that get worse. You have a fever. You have a severe headache. You have been vomiting and have problems, such as: Your vomiting gets worse. Your vomit includes blood or green matter (bile). You cannot eat or drink without vomiting. You  have problems with urination or bowel movements, such as: Diarrhea that gets worse. Blood in your stool (feces). This may cause stool to look black and tarry. Not urinating, or urinating only a small amount of very dark urine, within 6-8 hours. You have trouble breathing. You have symptoms that get worse with treatment. These symptoms may be an emergency. Get help right away. Call 911. Do not wait to see if the symptoms will go away. Do not drive yourself to the hospital. This information is not intended to replace advice given to you by your health care provider. Make sure you discuss any questions you have with your health care provider. Document Revised: 10/25/2021 Document Reviewed: 10/23/2021 Elsevier Patient Education  [place].

## 2022-10-18 NOTE — Progress Notes (Signed)
Nutrition follow-up completed with patient during infusion for metastatic urothelial carcinoma.  Weight decreased and documented as 178 pounds 8 ounces on April 25.  This is decreased from 191 pounds 8 ounces February 15.  This is a 7% weight loss in less than 3 months.  This is not clinically significant however it is concerning.  Labs noted: Glucose 118 and creatinine 1.19.  Patient currently eating a tuna sandwich from [place] and reports it is delicious.  Reports she could eat more tuna and sardines.  Her constipation has improved.  She is nervous about upcoming injection and developing poor appetite and taste alterations again.  Patient reports she forgot about trying baking soda and salt water gargle.  Nutrition diagnosis: Unintended weight loss continues.  Intervention: Educated on importance of trying baking soda and salt water gargles before eating.  Reviewed other strategies for improving taste and provided an additional nutrition fact sheet today. Consume Ensure Plus twice daily.  Provided additional coupons. Continue bowel regimen.  Monitoring, evaluation, goals: Patient will tolerate adequate calories and protein to minimize weight loss.  Next visit: To be scheduled with upcoming treatments as needed.  **Disclaimer: This note was dictated with voice recognition software. Similar sounding words can inadvertently be transcribed and this note may contain transcription errors which may not have been corrected upon publication of note.**

## 2022-10-20 ENCOUNTER — Inpatient Hospital Stay: Payer: Medicare Other

## 2022-10-20 VITALS — BP 133/62 | HR 60 | Temp 97.5°F | Resp 18

## 2022-10-20 DIAGNOSIS — Z885 Allergy status to narcotic agent status: Secondary | ICD-10-CM | POA: Diagnosis not present

## 2022-10-20 DIAGNOSIS — Z5111 Encounter for antineoplastic chemotherapy: Secondary | ICD-10-CM | POA: Diagnosis not present

## 2022-10-20 DIAGNOSIS — C679 Malignant neoplasm of bladder, unspecified: Secondary | ICD-10-CM

## 2022-10-20 DIAGNOSIS — Z79631 Long term (current) use of antimetabolite agent: Secondary | ICD-10-CM | POA: Diagnosis not present

## 2022-10-20 DIAGNOSIS — E860 Dehydration: Secondary | ICD-10-CM

## 2022-10-20 DIAGNOSIS — C7910 Secondary malignant neoplasm of unspecified urinary organs: Secondary | ICD-10-CM

## 2022-10-20 DIAGNOSIS — Z95828 Presence of other vascular implants and grafts: Secondary | ICD-10-CM

## 2022-10-20 DIAGNOSIS — R059 Cough, unspecified: Secondary | ICD-10-CM | POA: Diagnosis not present

## 2022-10-20 DIAGNOSIS — M2550 Pain in unspecified joint: Secondary | ICD-10-CM | POA: Diagnosis not present

## 2022-10-20 DIAGNOSIS — C7800 Secondary malignant neoplasm of unspecified lung: Secondary | ICD-10-CM | POA: Diagnosis not present

## 2022-10-20 DIAGNOSIS — R0609 Other forms of dyspnea: Secondary | ICD-10-CM | POA: Diagnosis not present

## 2022-10-20 DIAGNOSIS — Z881 Allergy status to other antibiotic agents status: Secondary | ICD-10-CM | POA: Diagnosis not present

## 2022-10-20 DIAGNOSIS — Z7902 Long term (current) use of antithrombotics/antiplatelets: Secondary | ICD-10-CM | POA: Diagnosis not present

## 2022-10-20 DIAGNOSIS — R5383 Other fatigue: Secondary | ICD-10-CM | POA: Diagnosis not present

## 2022-10-20 DIAGNOSIS — Z79630 Long term (current) use of alkylating agent: Secondary | ICD-10-CM | POA: Diagnosis not present

## 2022-10-20 DIAGNOSIS — Z79899 Other long term (current) drug therapy: Secondary | ICD-10-CM | POA: Diagnosis not present

## 2022-10-20 DIAGNOSIS — Z5189 Encounter for other specified aftercare: Secondary | ICD-10-CM | POA: Diagnosis not present

## 2022-10-20 MED ORDER — SODIUM CHLORIDE 0.9 % IV SOLN: Freq: Once | INTRAVENOUS | Status: AC

## 2022-10-20 MED ORDER — PEGFILGRASTIM INJECTION 6 MG/0.6ML ~~LOC~~
6.0000 mg | PREFILLED_SYRINGE | Freq: Once | SUBCUTANEOUS | Status: AC
  Administered 2022-10-20: 6 mg via SUBCUTANEOUS

## 2022-10-20 MED ORDER — SODIUM CHLORIDE 0.9% FLUSH
10.0000 mL | Freq: Once | INTRAVENOUS | Status: AC
  Administered 2022-10-20: 10 mL

## 2022-10-20 MED ORDER — HEPARIN SOD (PORK) LOCK FLUSH 100 UNIT/ML IV SOLN
500.0000 [IU] | Freq: Once | INTRAVENOUS | Status: AC
  Administered 2022-10-20: 500 [IU]

## 2022-10-20 NOTE — Patient Instructions (Signed)

## 2022-10-22 ENCOUNTER — Telehealth: Payer: Self-pay | Admitting: Medical Oncology

## 2022-10-22 NOTE — Telephone Encounter (Signed)
Can she dye her hair.  - She wants to dye her gray hair before Mother's Day .

## 2022-10-26 DIAGNOSIS — M5416 Radiculopathy, lumbar region: Secondary | ICD-10-CM | POA: Diagnosis not present

## 2022-10-29 NOTE — Progress Notes (Signed)
[facility] OFFICE PROGRESS NOTE  Morgan Mires, MD [address] [place] [address]  DIAGNOSIS: Metastatic urothelial carcinoma that was initially diagnosed as stage II (T2, N0, M0) in September 2022 status post right nephroureterectomy as well as intravesical treatment and resection of superficial tumor in the bladder several times and March 2023 as well as July 2023.  The patient was found to have enlarging and new pulmonary nodules consistent with metastatic disease in November 2023.   PRIOR THERAPY:   1) Right nephroureterectomy as well as intravesical treatment and resection of superficial tumor in the bladder several times and March 2023 as well as July 2023.  2) Palliative systemic chemotherapy with carboplatin for AUC of 5 on day 1 and gemcitabine 1000 mg/M2 on days 1 and 8 every 3 weeks. First cycle June 21, 2022. Status post 6 cycles. Her dose of carboplatin was reduced to AUC of 4 and gemcitabine to 800 mg/m2 starting from cycle #5 due to cytopenias.  CURRENT THERAPY: Maintenance immunotherapy with Avelumab 800 mg IV every 2 weeks. First dose expected on 11/15/22  INTERVAL HISTORY: Morgan Torres 72 y.o. female returns to the clinic today for a follow-up visit.  The patient was last seen 3 weeks ago by Dr. Arbutus Ped.  At that point in time, she underwent her last cycle with cycle #6 of chemotherapy.  She had a challenging time with chemotherapy due to cytopenias often requiring supportive care such as IV fluid and blood transfusions.  Her dose of chemotherapy was reduced due to cytopenias.  Overall, she recovered well. Today she denies any fever, chills, or unexplained weight loss. She reports stable night sweats which has been occurring "forever".  She has been seeing a member the nutritionist team for her decreased appetite which is now "way better than it was". She had some constipation secondary to iron supplement for which she takes smooth move tea and MiraLAX  which is effective for her.  Denies any cough, hemoptysis, or chest pain. She reports occasional mild dyspnea on exertion.  Denies any nausea, vomiting, or diarrhea.  Denies any blood in the urine.  Denies any changes with her chronic back pain which she is followed by [facility] for degenerative disc disease. She had a nerve block and epidural injection last week. She continues to have some back pain that has not improved with the injection. She recently had a restaging CT scan performed.  She is here today for evaluation to review her scan results and discuss the next steps.   MEDICAL HISTORY: Past Medical History:  Diagnosis Date   Anxiety    Arthritis    hip, lumbar spine    Asthma    seasonal allergies    Bronchitis    Hx: of   Chronic kidney disease    COPD (chronic obstructive pulmonary disease) (HCC)    Coronary artery disease    Depression    Fibromyalgia    Hypertension    Lumbar herniated disc    Myocardial infarction (HCC) 06/25/2012   followed by Dr. Rennis Golden, treated medically, no stents   Peripheral arterial disease (HCC)    of right foot   Pneumonia    Hx: of yrs ago   Sciatica    Sleep apnea    Small bowel obstruction (HCC)    several yrs ago    ALLERGIES:  is allergic to diclofenac, codeine, hydrocodone, and ciprofloxacin.  MEDICATIONS:  Current Outpatient Medications  Medication Sig Dispense Refill  acetaminophen (TYLENOL) 500 MG tablet Take 1 tablet (500 mg total) by mouth every 6 (six) hours as needed for mild pain. 30 tablet 0   albuterol (PROVENTIL HFA;VENTOLIN HFA) 108 (90 Base) MCG/ACT inhaler Inhale 2 puffs into the lungs every 6 (six) hours as needed for wheezing or shortness of breath. 1 Inhaler 0   amLODipine (NORVASC) 5 MG tablet TAKE 1 TABLET BY MOUTH EVERY DAY 90 tablet 0   amoxicillin-clavulanate (AUGMENTIN) 875-125 MG tablet Take 1 tablet by mouth every 12 (twelve) hours. 14 tablet 0   aspirin EC 81 MG tablet Take 81 mg by mouth daily. Swallow  whole.     atorvastatin (LIPITOR) 20 MG tablet Take 20 mg by mouth. Monday wed and Friday in am     cilostazol (PLETAL) 100 MG tablet Take 100 mg by mouth 2 (two) times daily.     EPINEPHrine (EPIPEN 2-PAK) 0.3 mg/0.3 mL DEVI Inject 0.3 mLs (0.3 mg total) into the muscle once as needed (for severe allergic reaction). CAll 911 immediately if you have to use this medicine 1 Device 1   lidocaine-prilocaine (EMLA) cream Apply to the Port-A-Cath site 30-60-minute before treatment 30 g 2   LORazepam (ATIVAN) 0.5 MG tablet Take 0.5 mg by mouth daily as needed for anxiety.     losartan-hydrochlorothiazide (HYZAAR) 100-12.5 MG tablet Take 1 tablet by mouth daily.     metoprolol tartrate (LOPRESSOR) 25 MG tablet Take 1 tablet (25 mg total) by mouth 2 (two) times daily. Call and schedule follow up appt to receive further refills. 2nd attempt. [phone] 30 tablet 0   nicotine (NICODERM CQ - DOSED IN MG/24 HOURS) 21 mg/24hr patch Place 21 mg onto the skin daily.     nicotine polacrilex (NICORETTE) 4 MG gum Take 4 mg by mouth as needed for smoking cessation.     nitroGLYCERIN (NITROSTAT) 0.4 MG SL tablet Place 1 tablet (0.4 mg total) under the tongue every 5 (five) minutes x 3 doses as needed for chest pain. 25 tablet 2   oxybutynin (DITROPAN) 5 MG tablet Take 1 tablet (5 mg total) by mouth every 8 (eight) hours as needed for bladder spasms. 30 tablet 1   polyethylene glycol (MIRALAX / GLYCOLAX) 17 g packet Take 17 g by mouth as needed.     prochlorperazine (COMPAZINE) 10 MG tablet Take 1 tablet (10 mg total) by mouth every 6 (six) hours as needed for nausea or vomiting. 30 tablet 0   traMADol (ULTRAM) 50 MG tablet Take by mouth every 6 (six) hours as needed. Not taking     No current facility-administered medications for this visit.    SURGICAL HISTORY:  Past Surgical History:  Procedure Laterality Date   ABDOMINAL SURGERY     resection of small intestine   ANTERIOR CRUCIATE LIGAMENT REPAIR Bilateral     BACK SURGERY     fusion of lumbar   CARDIAC CATHETERIZATION  06/25/2012   COLON SURGERY     resection for bowel - for obstruction  6 to 7 yrs ago per pt on 01-05-2022   COLONOSCOPY     Hx; of   DILATION AND CURETTAGE OF UTERUS     EYE SURGERY Bilateral    cataracts removed   HERNIA REPAIR  02/23/1969   inguinal hernia    IR IMAGING GUIDED PORT INSERTION  06/07/2022   LEFT HEART CATHETERIZATION WITH CORONARY ANGIOGRAM N/A 03/30/2013   Procedure: LEFT HEART CATHETERIZATION WITH CORONARY ANGIOGRAM;  Surgeon: Marykay Lex, MD;  Location:  [facility];  Service: Cardiovascular;  Laterality: N/A;   left thumb surgery     yrs ago   ROBOT ASSITED LAPAROSCOPIC NEPHROURETERECTOMY Left 03/09/2021   Procedure: XI ROBOT ASSITED LAPAROSCOPIC NEPHROURETERECTOMY/ CYSTOSCOPY WITH LEFT URETEROSCOPY WITH TRANSURETHRAL RESECTION OF URETERAL ORIFICE;  Surgeon: Rene Paci, MD;  Location: [facility];  Service: Urology;  Laterality: Left;   SALIVARY GLAND SURGERY Left    approached from inside mouth & side of neck 1970-1980   TONSILLECTOMY     as child   TOTAL HIP ARTHROPLASTY Left 05/28/2014   Procedure: LEFT TOTAL HIP ARTHROPLASTY;  Surgeon: Thera Flake., MD;  Location: [facility] OR;  Service: Orthopedics;  Laterality: Left;   TRANSURETHRAL RESECTION OF BLADDER TUMOR N/A 08/23/2021   Procedure: TRANSURETHRAL RESECTION OF BLADDER TUMOR (TURBT) WITH CYSTOSCOPY/ POSTOPERATIVE INSTILLATION OF GEMCITABINE/retrograde pyelogram and stent placement (right);  Surgeon: Rene Paci, MD;  Location: [facility];  Service: Urology;  Laterality: N/A;   TRANSURETHRAL RESECTION OF BLADDER TUMOR N/A 01/10/2022   Procedure: TRANSURETHRAL RESECTION OF BLADDER TUMOR (TURBT) WITH CYSTOSCOPY / GEMCITABINE INSTILLATION post-operatively;  Surgeon: Rene Paci, MD;  Location: [facility];  Service: Urology;  Laterality: N/A;  ONLY NEEDS 30 MIN    REVIEW OF SYSTEMS:    Review of Systems  Constitutional: Positive for improving fatigue and appetite. Negative for  chills,  fever and unexpected weight change.  HENT: Negative for mouth sores, nosebleeds, sore throat and trouble swallowing.   Eyes: Negative for eye problems and icterus.  Respiratory: positive for occasional dyspnea on exertion. Negative for cough, hemoptysis, and wheezing.   Cardiovascular: Negative for chest pain and leg swelling.  Gastrointestinal: Negative for abdominal pain, constipation, diarrhea, nausea and vomiting.  Genitourinary: Negative for bladder incontinence, difficulty urinating, dysuria, frequency and hematuria.   Musculoskeletal: Positive for chronic back pain. Negative for gait problem, neck pain and neck stiffness.  Skin: Negative for itching and rash.  Neurological: Negative for dizziness, extremity weakness, gait problem, headaches, light-headedness and seizures.  Hematological: Negative for adenopathy. Does not bruise/bleed easily.  Psychiatric/Behavioral: Negative for confusion, depression and sleep disturbance. The patient is not nervous/anxious.     PHYSICAL EXAMINATION:  There were no vitals taken for this visit.  ECOG PERFORMANCE STATUS: 1  Physical Exam  Constitutional: Oriented to person, place, and time and well-developed, well-nourished, and in no distress.   HENT:  Head: Normocephalic and atraumatic.  Mouth/Throat: Oropharynx is clear and moist. No oropharyngeal exudate.  Eyes: Conjunctivae are normal. Right eye exhibits no discharge. Left eye exhibits no discharge. No scleral icterus.  Neck: Normal range of motion. Neck supple.  Cardiovascular: Normal rate, regular rhythm, normal heart sounds and intact distal pulses.   Pulmonary/Chest: Effort normal and breath sounds normal. No respiratory distress. No wheezes. No rales.  Abdominal: Soft. Bowel sounds are normal. Exhibits no distension and no mass. There is no tenderness.  Musculoskeletal: Normal range of  motion. Exhibits no edema.  Lymphadenopathy:    No cervical adenopathy.  Neurological: Alert and oriented to person, place, and time. Exhibits normal muscle tone. Gait normal. Coordination normal. Ambulates with a cane.  Skin: Skin is warm and dry. No rash noted. Not diaphoretic. No erythema. No pallor.  Psychiatric: Mood, memory and judgment normal.  Vitals reviewed.  LABORATORY DATA: Lab Results  Component Value Date   WBC 8.8 10/18/2022   HGB 10.4 (L) 10/18/2022   HCT 31.2 (L) 10/18/2022   MCV 99.4 10/18/2022   PLT  173 10/18/2022      Chemistry      Component Value Date/Time   NA 137 10/18/2022 1255   K 3.9 10/18/2022 1255   CL 103 10/18/2022 1255   CO2 27 10/18/2022 1255   BUN 19 10/18/2022 1255   CREATININE 1.19 (H) 10/18/2022 1255      Component Value Date/Time   CALCIUM 9.2 10/18/2022 1255   ALKPHOS 122 10/18/2022 1255   AST 14 (L) 10/18/2022 1255   ALT 9 10/18/2022 1255   BILITOT 0.5 10/18/2022 1255       RADIOGRAPHIC STUDIES:  DG Ankle Complete Left  Result Date: 10/14/2022 CLINICAL DATA:  Fall.  Twisted ankle.  Ankle pain. EXAM: LEFT ANKLE COMPLETE - 3 VIEW COMPARISON:  None Available. FINDINGS: Soft swelling is present over the medial malleolus. No acute fracture is present. Mild degenerative changes are present in the hindfoot. IMPRESSION: Soft tissue swelling over the medial malleolus without underlying fracture. Electronically Signed   By: Marin Roberts M.D.   On: 10/14/2022 18:11     ASSESSMENT/PLAN:  This is a very pleasant 72 year old African-American female with metastatic urothelial carcinoma.  She was initially diagnosed as a stage II (T2, N0, M0) in September 2022.   She is status post right nephroureterectomy as well as  intravesical treatment and resection of superficial tumor in the bladder several times and March 2023 as well as July 2023.  The patient was found to have enlarging and new pulmonary nodules consistent with metastatic  disease in November 2023.  Dr. Arbutus Ped sent her tissue for foundation 1 and PD-L1 expression. She does not have any actionable mutations.   She completed 6 cycles of systemic chemotherapy with carboplatin for an AUC of 5 on day 1 and gemcitabine 1000 milligrams per meter squared on days 1 and 8 IV every 3 weeks with Neulasta support.  Her dose was reduced to carboplatin for an AUC of 4 and gemcitabine 800 mg/m due to cytopenias transfusion support.  The patient recently had a restaging CT scan performed.  Dr. Arbutus Ped personally and independently reviewed the scan discussed results with the patient today.  The scan shows stable disease.   Dr. Arbutus Ped recommends maintenance immunotherapy with Avelumab 2 mg IV every 2 weeks.  The patient is interested in this option and she is expected to receive her first dose of treatment next week.  She prefers to keep her appointments on Thursdays.  Therefore we will arrange for her first infusion on 11/15/2022.  I discussed with her the adverse effect of the immunotherapy including but not limited to immunotherapy mediated skin rash, diarrhea, inflammation of the lung, kidney, liver, thyroid or other endocrine dysfunction  Will see her back for follow-up visit in 3 weeks with her second cycle of treatment.  The patient was advised to call immediately if she has any concerning symptoms in the interval. The patient voices understanding of current disease status and treatment options and is in agreement with the current care plan. All questions were answered. The patient knows to call the clinic with any problems, questions or concerns. We can certainly see the patient much sooner if necessary  No orders of the defined types were placed in this encounter.   Trenity Pha L Sibyl Mikula, PA-C 10/29/22  ADDENDUM:: Hematology/Oncology Attending: I had a face-to-face encounter with the patient today.  I reviewed her records, lab, scan and recommended her care plan.   This is a very pleasant 72 years old African-American female with metastatic urothelial carcinoma  initially diagnosed as a stage II (T2, N0, M0) in September 2022 status post right nephroureterectomy as well as intravesical treatment and resection of superficial tumor of the bladder several times in March 2023 and July 2023.  The patient was found to have metastatic disease in November 2023.  She underwent systemic chemotherapy with 6 cycles of carboplatin and gemcitabine.  She has no evidence for disease progression after cycle #6. I had a lengthy discussion with the patient today about her recent CT scan results as well as treatment options. I recommended for the patient maintenance treatment with immunotherapy with Avelumab 800 Mg IV every 2 weeks for a total of 1 year. I discussed with the patient the adverse effect of the immunotherapy including but not limited to immunotherapy mediated skin rash, diarrhea, inflammation of the lung, kidney, liver, thyroid or other endocrine dysfunction. The patient is interested in the maintenance treatment and she would like to proceed as planned. She is expected to start the first cycle of this treatment next week. She will come back for follow-up visit in around 3 weeks for evaluation before starting cycle #2. The patient was advised to call immediately if she has any other concerning symptoms in the interval. The total time spent in the appointment was 30 minutes. Disclaimer: This note was dictated with voice recognition software. Similar sounding words can inadvertently be transcribed and may be missed upon review. Lajuana Matte, MD

## 2022-11-01 ENCOUNTER — Telehealth: Payer: Self-pay

## 2022-11-01 ENCOUNTER — Other Ambulatory Visit: Payer: Self-pay

## 2022-11-01 ENCOUNTER — Inpatient Hospital Stay: Payer: Medicare Other

## 2022-11-01 ENCOUNTER — Inpatient Hospital Stay: Payer: Medicare Other | Attending: Internal Medicine

## 2022-11-01 DIAGNOSIS — Z5112 Encounter for antineoplastic immunotherapy: Secondary | ICD-10-CM | POA: Insufficient documentation

## 2022-11-01 DIAGNOSIS — Z885 Allergy status to narcotic agent status: Secondary | ICD-10-CM | POA: Insufficient documentation

## 2022-11-01 DIAGNOSIS — R0609 Other forms of dyspnea: Secondary | ICD-10-CM | POA: Insufficient documentation

## 2022-11-01 DIAGNOSIS — R61 Generalized hyperhidrosis: Secondary | ICD-10-CM | POA: Diagnosis not present

## 2022-11-01 DIAGNOSIS — C679 Malignant neoplasm of bladder, unspecified: Secondary | ICD-10-CM | POA: Diagnosis not present

## 2022-11-01 DIAGNOSIS — M549 Dorsalgia, unspecified: Secondary | ICD-10-CM | POA: Diagnosis not present

## 2022-11-01 DIAGNOSIS — C7910 Secondary malignant neoplasm of unspecified urinary organs: Secondary | ICD-10-CM

## 2022-11-01 DIAGNOSIS — C7800 Secondary malignant neoplasm of unspecified lung: Secondary | ICD-10-CM

## 2022-11-01 DIAGNOSIS — Z79620 Long term (current) use of immunosuppressive biologic: Secondary | ICD-10-CM | POA: Diagnosis not present

## 2022-11-01 DIAGNOSIS — Z7902 Long term (current) use of antithrombotics/antiplatelets: Secondary | ICD-10-CM | POA: Diagnosis not present

## 2022-11-01 DIAGNOSIS — Z79899 Other long term (current) drug therapy: Secondary | ICD-10-CM | POA: Insufficient documentation

## 2022-11-01 DIAGNOSIS — D649 Anemia, unspecified: Secondary | ICD-10-CM

## 2022-11-01 DIAGNOSIS — Z881 Allergy status to other antibiotic agents status: Secondary | ICD-10-CM | POA: Diagnosis not present

## 2022-11-01 DIAGNOSIS — I252 Old myocardial infarction: Secondary | ICD-10-CM | POA: Insufficient documentation

## 2022-11-01 DIAGNOSIS — Z95828 Presence of other vascular implants and grafts: Secondary | ICD-10-CM

## 2022-11-01 LAB — CMP (CANCER CENTER ONLY)
ALT: 10 U/L (ref 0–44)
AST: 12 U/L — ABNORMAL LOW (ref 15–41)
Albumin: 3.7 g/dL (ref 3.5–5.0)
Alkaline Phosphatase: 141 U/L — ABNORMAL HIGH (ref 38–126)
Anion gap: 5 (ref 5–15)
BUN: 15 mg/dL (ref 8–23)
CO2: 28 mmol/L (ref 22–32)
Calcium: 8.5 mg/dL — ABNORMAL LOW (ref 8.9–10.3)
Chloride: 106 mmol/L (ref 98–111)
Creatinine: 1.23 mg/dL — ABNORMAL HIGH (ref 0.44–1.00)
GFR, Estimated: 46 mL/min — ABNORMAL LOW (ref >60)
Glucose, Bld: 104 mg/dL — ABNORMAL HIGH (ref 70–99)
Potassium: 4.2 mmol/L (ref 3.5–5.1)
Sodium: 139 mmol/L (ref 135–145)
Total Bilirubin: 0.2 mg/dL — ABNORMAL LOW (ref 0.3–1.2)
Total Protein: 6.4 g/dL — ABNORMAL LOW (ref 6.5–8.1)

## 2022-11-01 LAB — CBC WITH DIFFERENTIAL (CANCER CENTER ONLY)
Abs Immature Granulocytes: 0.14 10*3/uL — ABNORMAL HIGH (ref 0.00–0.07)
Basophils Absolute: 0 10*3/uL (ref 0.0–0.1)
Basophils Relative: 0 %
Eosinophils Absolute: 0.2 10*3/uL (ref 0.0–0.5)
Eosinophils Relative: 1 %
HCT: 29.5 % — ABNORMAL LOW (ref 36.0–46.0)
Hemoglobin: 9.7 g/dL — ABNORMAL LOW (ref 12.0–15.0)
Immature Granulocytes: 1 %
Lymphocytes Relative: 16 %
Lymphs Abs: 2.4 10*3/uL (ref 0.7–4.0)
MCH: 34 pg (ref 26.0–34.0)
MCHC: 32.9 g/dL (ref 30.0–36.0)
MCV: 103.5 fL — ABNORMAL HIGH (ref 80.0–100.0)
Monocytes Absolute: 1.6 10*3/uL — ABNORMAL HIGH (ref 0.1–1.0)
Monocytes Relative: 11 %
Neutro Abs: 10.8 10*3/uL — ABNORMAL HIGH (ref 1.7–7.7)
Neutrophils Relative %: 71 %
Platelet Count: 107 10*3/uL — ABNORMAL LOW (ref 150–400)
RBC: 2.85 MIL/uL — ABNORMAL LOW (ref 3.87–5.11)
RDW: 21 % — ABNORMAL HIGH (ref 11.5–15.5)
WBC Count: 15.1 10*3/uL — ABNORMAL HIGH (ref 4.0–10.5)
nRBC: 0 % (ref 0.0–0.2)

## 2022-11-01 LAB — SAMPLE TO BLOOD BANK

## 2022-11-01 MED ORDER — HEPARIN SOD (PORK) LOCK FLUSH 100 UNIT/ML IV SOLN
500.0000 [IU] | Freq: Once | INTRAVENOUS | Status: AC
  Administered 2022-11-01: 500 [IU]

## 2022-11-01 MED ORDER — SODIUM CHLORIDE 0.9% FLUSH
10.0000 mL | Freq: Once | INTRAVENOUS | Status: AC
  Administered 2022-11-01: 10 mL

## 2022-11-01 NOTE — Telephone Encounter (Signed)
CRITICAL VALUE STICKER  CRITICAL VALUE: HGB 9.7  RECEIVER (on-site recipient of call): Omer Monter  DATE & TIME NOTIFIED: 11/01/22 10:34 am  MESSENGER (representative from lab): Shanda Bumps

## 2022-11-03 ENCOUNTER — Ambulatory Visit (HOSPITAL_BASED_OUTPATIENT_CLINIC_OR_DEPARTMENT_OTHER)
Admission: RE | Admit: 2022-11-03 | Discharge: 2022-11-03 | Disposition: A | Discharge status: Home / Self Care | Payer: Medicare Other | Source: Ambulatory Visit | Attending: Internal Medicine | Admitting: Internal Medicine

## 2022-11-03 DIAGNOSIS — I700 Atherosclerosis of aorta: Secondary | ICD-10-CM | POA: Diagnosis not present

## 2022-11-03 DIAGNOSIS — C7910 Secondary malignant neoplasm of unspecified urinary organs: Secondary | ICD-10-CM | POA: Insufficient documentation

## 2022-11-03 DIAGNOSIS — J439 Emphysema, unspecified: Secondary | ICD-10-CM | POA: Diagnosis not present

## 2022-11-03 DIAGNOSIS — C679 Malignant neoplasm of bladder, unspecified: Secondary | ICD-10-CM | POA: Diagnosis not present

## 2022-11-03 DIAGNOSIS — I2510 Atherosclerotic heart disease of native coronary artery without angina pectoris: Secondary | ICD-10-CM | POA: Diagnosis not present

## 2022-11-03 DIAGNOSIS — C7800 Secondary malignant neoplasm of unspecified lung: Secondary | ICD-10-CM | POA: Diagnosis not present

## 2022-11-03 DIAGNOSIS — N3289 Other specified disorders of bladder: Secondary | ICD-10-CM | POA: Diagnosis not present

## 2022-11-03 DIAGNOSIS — J432 Centrilobular emphysema: Secondary | ICD-10-CM | POA: Diagnosis not present

## 2022-11-05 ENCOUNTER — Other Ambulatory Visit: Payer: Self-pay

## 2022-11-05 ENCOUNTER — Encounter: Payer: Self-pay | Admitting: Internal Medicine

## 2022-11-05 ENCOUNTER — Inpatient Hospital Stay (HOSPITAL_BASED_OUTPATIENT_CLINIC_OR_DEPARTMENT_OTHER): Payer: Medicare Other | Admitting: Physician Assistant

## 2022-11-05 VITALS — BP 154/56 | HR 87 | Temp 98.4°F | Resp 19 | Ht 68.0 in | Wt 184.4 lb

## 2022-11-05 DIAGNOSIS — Z79899 Other long term (current) drug therapy: Secondary | ICD-10-CM | POA: Diagnosis not present

## 2022-11-05 DIAGNOSIS — Z7902 Long term (current) use of antithrombotics/antiplatelets: Secondary | ICD-10-CM | POA: Diagnosis not present

## 2022-11-05 DIAGNOSIS — Z79620 Long term (current) use of immunosuppressive biologic: Secondary | ICD-10-CM | POA: Diagnosis not present

## 2022-11-05 DIAGNOSIS — R61 Generalized hyperhidrosis: Secondary | ICD-10-CM | POA: Diagnosis not present

## 2022-11-05 DIAGNOSIS — C7800 Secondary malignant neoplasm of unspecified lung: Secondary | ICD-10-CM | POA: Diagnosis not present

## 2022-11-05 DIAGNOSIS — R0609 Other forms of dyspnea: Secondary | ICD-10-CM | POA: Diagnosis not present

## 2022-11-05 DIAGNOSIS — Z5112 Encounter for antineoplastic immunotherapy: Secondary | ICD-10-CM | POA: Diagnosis not present

## 2022-11-05 DIAGNOSIS — Z881 Allergy status to other antibiotic agents status: Secondary | ICD-10-CM | POA: Diagnosis not present

## 2022-11-05 DIAGNOSIS — M549 Dorsalgia, unspecified: Secondary | ICD-10-CM | POA: Diagnosis not present

## 2022-11-05 DIAGNOSIS — Z7189 Other specified counseling: Secondary | ICD-10-CM | POA: Diagnosis not present

## 2022-11-05 DIAGNOSIS — C679 Malignant neoplasm of bladder, unspecified: Secondary | ICD-10-CM | POA: Diagnosis not present

## 2022-11-05 DIAGNOSIS — Z885 Allergy status to narcotic agent status: Secondary | ICD-10-CM | POA: Diagnosis not present

## 2022-11-05 DIAGNOSIS — C7910 Secondary malignant neoplasm of unspecified urinary organs: Secondary | ICD-10-CM | POA: Diagnosis not present

## 2022-11-05 DIAGNOSIS — I252 Old myocardial infarction: Secondary | ICD-10-CM | POA: Diagnosis not present

## 2022-11-05 NOTE — Progress Notes (Signed)
DISCONTINUE ON PATHWAY REGIMEN - Bladder     A cycle is every 21 days:     Gemcitabine      Carboplatin   **Always confirm dose/schedule in your pharmacy ordering system**  REASON: Other Reason PRIOR TREATMENT: [id number]: Carboplatin AUC=5 D1 + Gemcitabine 1,000 mg/m2 D1, 8 q21 Days for a Maximum of 6 Cycles TREATMENT RESPONSE: Stable Disease (SD)  START ON PATHWAY REGIMEN - Bladder     A cycle is every 14 days:     Avelumab   **Always confirm dose/schedule in your pharmacy ordering system**  Patient Characteristics: Advanced/Metastatic Disease, Maintenance Therapy Therapeutic Status: Advanced/Metastatic Disease Line of Therapy: Maintenance Therapy  Intent of Therapy: Non-Curative / Palliative Intent, Discussed with Patient

## 2022-11-06 ENCOUNTER — Other Ambulatory Visit: Payer: Self-pay

## 2022-11-07 ENCOUNTER — Other Ambulatory Visit: Payer: Self-pay

## 2022-11-07 NOTE — Progress Notes (Addendum)
Pharmacist Chemotherapy Monitoring - Initial Assessment    Anticipated start date: 11/14/22   The following has been reviewed per standard work regarding the patient's treatment regimen: The patient's diagnosis, treatment plan and drug doses, and organ/hematologic function Lab orders and baseline tests specific to treatment regimen  The treatment plan start date, drug sequencing, and pre-medications Prior authorization status  Patient's documented medication list, including drug-drug interaction screen and prescriptions for anti-emetics and supportive care specific to the treatment regimen The drug concentrations, fluid compatibility, administration routes, and timing of the medications to be used The patient's access for treatment and lifetime cumulative dose history, if applicable  The patient's medication allergies and previous infusion related reactions, if applicable   Changes made to treatment plan:  N/A  Follow up needed:   Pharmacy buyer notified to order drug Prior auth pending   Janeice Robinson, [facility], 11/07/2022  1:55 PM

## 2022-11-08 ENCOUNTER — Other Ambulatory Visit: Payer: Self-pay | Admitting: Cardiology

## 2022-11-08 ENCOUNTER — Other Ambulatory Visit: Payer: Self-pay | Admitting: Internal Medicine

## 2022-11-12 DIAGNOSIS — M545 Low back pain, unspecified: Secondary | ICD-10-CM | POA: Diagnosis not present

## 2022-11-13 ENCOUNTER — Other Ambulatory Visit: Payer: Self-pay

## 2022-11-14 ENCOUNTER — Other Ambulatory Visit: Payer: Self-pay

## 2022-11-14 ENCOUNTER — Inpatient Hospital Stay: Payer: Medicare Other

## 2022-11-14 VITALS — BP 103/45 | HR 60 | Temp 97.7°F | Resp 17 | Wt 180.5 lb

## 2022-11-14 DIAGNOSIS — C7910 Secondary malignant neoplasm of unspecified urinary organs: Secondary | ICD-10-CM

## 2022-11-14 DIAGNOSIS — Z79620 Long term (current) use of immunosuppressive biologic: Secondary | ICD-10-CM | POA: Diagnosis not present

## 2022-11-14 DIAGNOSIS — I252 Old myocardial infarction: Secondary | ICD-10-CM | POA: Diagnosis not present

## 2022-11-14 DIAGNOSIS — Z7902 Long term (current) use of antithrombotics/antiplatelets: Secondary | ICD-10-CM | POA: Diagnosis not present

## 2022-11-14 DIAGNOSIS — Z5112 Encounter for antineoplastic immunotherapy: Secondary | ICD-10-CM | POA: Diagnosis not present

## 2022-11-14 DIAGNOSIS — D649 Anemia, unspecified: Secondary | ICD-10-CM

## 2022-11-14 DIAGNOSIS — M549 Dorsalgia, unspecified: Secondary | ICD-10-CM | POA: Diagnosis not present

## 2022-11-14 DIAGNOSIS — Z881 Allergy status to other antibiotic agents status: Secondary | ICD-10-CM | POA: Diagnosis not present

## 2022-11-14 DIAGNOSIS — C679 Malignant neoplasm of bladder, unspecified: Secondary | ICD-10-CM | POA: Diagnosis not present

## 2022-11-14 DIAGNOSIS — R61 Generalized hyperhidrosis: Secondary | ICD-10-CM | POA: Diagnosis not present

## 2022-11-14 DIAGNOSIS — Z885 Allergy status to narcotic agent status: Secondary | ICD-10-CM | POA: Diagnosis not present

## 2022-11-14 DIAGNOSIS — Z79899 Other long term (current) drug therapy: Secondary | ICD-10-CM | POA: Diagnosis not present

## 2022-11-14 DIAGNOSIS — R0609 Other forms of dyspnea: Secondary | ICD-10-CM | POA: Diagnosis not present

## 2022-11-14 LAB — SAMPLE TO BLOOD BANK

## 2022-11-14 LAB — CMP (CANCER CENTER ONLY)
ALT: 8 U/L (ref 0–44)
AST: 12 U/L — ABNORMAL LOW (ref 15–41)
Albumin: 3.9 g/dL (ref 3.5–5.0)
Alkaline Phosphatase: 94 U/L (ref 38–126)
Anion gap: 4 — ABNORMAL LOW (ref 5–15)
BUN: 21 mg/dL (ref 8–23)
CO2: 27 mmol/L (ref 22–32)
Calcium: 8.6 mg/dL — ABNORMAL LOW (ref 8.9–10.3)
Chloride: 108 mmol/L (ref 98–111)
Creatinine: 1.31 mg/dL — ABNORMAL HIGH (ref 0.44–1.00)
GFR, Estimated: 43 mL/min — ABNORMAL LOW (ref >60)
Glucose, Bld: 100 mg/dL — ABNORMAL HIGH (ref 70–99)
Potassium: 4 mmol/L (ref 3.5–5.1)
Sodium: 139 mmol/L (ref 135–145)
Total Bilirubin: 0.4 mg/dL (ref 0.3–1.2)
Total Protein: 6.4 g/dL — ABNORMAL LOW (ref 6.5–8.1)

## 2022-11-14 LAB — CBC WITH DIFFERENTIAL (CANCER CENTER ONLY)
Abs Immature Granulocytes: 0.02 10*3/uL (ref 0.00–0.07)
Basophils Absolute: 0 10*3/uL (ref 0.0–0.1)
Basophils Relative: 0 %
Eosinophils Absolute: 0.2 10*3/uL (ref 0.0–0.5)
Eosinophils Relative: 2 %
HCT: 30.2 % — ABNORMAL LOW (ref 36.0–46.0)
Hemoglobin: 10 g/dL — ABNORMAL LOW (ref 12.0–15.0)
Immature Granulocytes: 0 %
Lymphocytes Relative: 21 %
Lymphs Abs: 1.7 10*3/uL (ref 0.7–4.0)
MCH: 35.1 pg — ABNORMAL HIGH (ref 26.0–34.0)
MCHC: 33.1 g/dL (ref 30.0–36.0)
MCV: 106 fL — ABNORMAL HIGH (ref 80.0–100.0)
Monocytes Absolute: 1.1 10*3/uL — ABNORMAL HIGH (ref 0.1–1.0)
Monocytes Relative: 14 %
Neutro Abs: 5.3 10*3/uL (ref 1.7–7.7)
Neutrophils Relative %: 63 %
Platelet Count: 301 10*3/uL (ref 150–400)
RBC: 2.85 MIL/uL — ABNORMAL LOW (ref 3.87–5.11)
RDW: 19.5 % — ABNORMAL HIGH (ref 11.5–15.5)
WBC Count: 8.3 10*3/uL (ref 4.0–10.5)
nRBC: 0 % (ref 0.0–0.2)

## 2022-11-14 LAB — TSH: TSH: 1.243 u[IU]/mL (ref 0.350–4.500)

## 2022-11-14 MED ORDER — SODIUM CHLORIDE 0.9 % IV SOLN
800.0000 mg | Freq: Once | INTRAVENOUS | Status: AC
  Administered 2022-11-14: 800 mg via INTRAVENOUS
  Filled 2022-11-14: qty 40

## 2022-11-14 MED ORDER — LIDOCAINE-PRILOCAINE 2.5-2.5 % EX CREA
TOPICAL_CREAM | CUTANEOUS | 3 refills | Status: DC
Start: 2022-11-14 — End: 2022-12-26

## 2022-11-14 MED ORDER — DIPHENHYDRAMINE HCL 50 MG/ML IJ SOLN
25.0000 mg | Freq: Once | INTRAMUSCULAR | Status: AC
  Administered 2022-11-14: 25 mg via INTRAVENOUS
  Filled 2022-11-14: qty 1

## 2022-11-14 MED ORDER — PROCHLORPERAZINE MALEATE 10 MG PO TABS
10.0000 mg | ORAL_TABLET | Freq: Four times a day (QID) | ORAL | 1 refills | Status: DC | PRN
Start: 2022-11-14 — End: 2022-12-26

## 2022-11-14 MED ORDER — ONDANSETRON HCL 8 MG PO TABS
8.0000 mg | ORAL_TABLET | Freq: Three times a day (TID) | ORAL | 1 refills | Status: DC | PRN
Start: 2022-11-14 — End: 2023-02-06

## 2022-11-14 MED ORDER — SODIUM CHLORIDE 0.9 % IV SOLN: Freq: Once | INTRAVENOUS | Status: AC

## 2022-11-14 MED ORDER — HEPARIN SOD (PORK) LOCK FLUSH 100 UNIT/ML IV SOLN
500.0000 [IU] | Freq: Once | INTRAVENOUS | Status: AC | PRN
  Administered 2022-11-14: 500 [IU]

## 2022-11-14 MED ORDER — ACETAMINOPHEN 325 MG PO TABS
650.0000 mg | ORAL_TABLET | Freq: Once | ORAL | Status: AC
  Administered 2022-11-14: 650 mg via ORAL
  Filled 2022-11-14: qty 2

## 2022-11-14 MED ORDER — SODIUM CHLORIDE 0.9% FLUSH
10.0000 mL | INTRAVENOUS | Status: DC | PRN
  Administered 2022-11-14: 10 mL

## 2022-11-14 NOTE — Patient Instructions (Signed)
[place] [facility]  Discharge Instructions: Thank you for choosing [facility] to provide your oncology and hematology care.   If you have a lab appointment with the Cancer Center, please go directly to the Cancer Center and check in at the registration area.   Wear comfortable clothing and clothing appropriate for easy access to any Portacath or PICC line.   We strive to give you quality time with your provider. You may need to reschedule your appointment if you arrive late (15 or more minutes).  Arriving late affects you and other patients whose appointments are after yours.  Also, if you miss three or more appointments without notifying the office, you may be dismissed from the clinic at the provider's discretion.      For prescription refill requests, have your pharmacy contact our office and allow 72 hours for refills to be completed.    Today you received the following chemotherapy and/or immunotherapy agents: Avelumab (Bavencio)      To help prevent nausea and vomiting after your treatment, we encourage you to take your nausea medication as directed.  BELOW ARE SYMPTOMS THAT SHOULD BE REPORTED IMMEDIATELY: *FEVER GREATER THAN 100.4 F (38 C) OR HIGHER *CHILLS OR SWEATING *NAUSEA AND VOMITING THAT IS NOT CONTROLLED WITH YOUR NAUSEA MEDICATION *UNUSUAL SHORTNESS OF BREATH *UNUSUAL BRUISING OR BLEEDING *URINARY PROBLEMS (pain or burning when urinating, or frequent urination) *BOWEL PROBLEMS (unusual diarrhea, constipation, pain near the anus) TENDERNESS IN MOUTH AND THROAT WITH OR WITHOUT PRESENCE OF ULCERS (sore throat, sores in mouth, or a toothache) UNUSUAL RASH, SWELLING OR PAIN  UNUSUAL VAGINAL DISCHARGE OR ITCHING   Items with * indicate a potential emergency and should be followed up as soon as possible or go to the Emergency Department if any problems should occur.  Please show the CHEMOTHERAPY ALERT CARD or IMMUNOTHERAPY ALERT  CARD at check-in to the Emergency Department and triage nurse.  Should you have questions after your visit or need to cancel or reschedule your appointment, please contact [place] [facility]  Dept: [phone]  and follow the prompts.  Office hours are 8:00 a.m. to 4:30 p.m. Monday - Friday. Please note that voicemails left after 4:00 p.m. may not be returned until the following business day.  We are closed weekends and major holidays. You have access to a nurse at all times for urgent questions. Please call the main number to the clinic Dept: [phone] and follow the prompts.   For any non-urgent questions, you may also contact your provider using MyChart. We now offer e-Visits for anyone 45 and older to request care online for non-urgent symptoms. For details visit [email or web address].   Also download the MyChart app! Go to the app store, search "MyChart", open the app, select [place], and log in with your MyChart username and password.  Avelumab Injection What is this medication? AVELUMAB (a VEL ue mab) treats some types of cancer. It works by helping your immune system slow or stop the spread of cancer cells. It is a monoclonal antibody. This medicine may be used for other purposes; ask your health care provider or pharmacist if you have questions. COMMON BRAND NAME(S): BAVENCIO What should I tell my care team before I take this medication? They need to know if you have any of these conditions: Allogeneic stem cell transplant (uses someone else's stem cells) Autoimmune conditions, such as Crohn disease, ulcerative colitis, lupus Diabetes Heart disease High blood  pressure High cholesterol History of chest radiation Nervous system problems, such as Guillain-Barre syndrome or myasthenia gravis Organ transplant An unusual or allergic reaction to avelumab, other medications, foods, dyes, or preservatives Pregnant or trying to get  pregnant Breastfeeding How should I use this medication? This medication is injected into a vein. It is given by your care team in a hospital or clinic setting. A special MedGuide will be given to you before each treatment. Be sure to read this information carefully each time. Talk to your care team about the use of this medication in children. While it may be prescribed for children as young as 12 years for selected conditions, precautions do apply. Overdosage: If you think you have taken too much of this medicine contact a poison control center or emergency room at once. NOTE: This medicine is only for you. Do not share this medicine with others. What if I miss a dose? Keep appointments for follow-up doses. It is important not to miss your dose. Call your care team if you are unable to keep an appointment. What may interact with this medication? Interactions have not been studied. This list may not describe all possible interactions. Give your health care provider a list of all the medicines, herbs, non-prescription drugs, or dietary supplements you use. Also tell them if you smoke, drink alcohol, or use illegal drugs. Some items may interact with your medicine. What should I watch for while using this medication? Your condition will be monitored carefully while you are receiving this medication. You may need blood work while taking this medication. This medication may cause serious skin reactions. They can happen weeks to months after starting the medication. Contact your care team right away if you notice fevers or flu-like symptoms with a rash. The rash may be red or purple and then turn into blisters or peeling of the skin. You may also notice a red rash with swelling of the face, lips, or lymph nodes in your neck or under your arms. Tell your care team right away if you have any change in your eyesight. Talk to your care team if you may be pregnant. Serious birth defects can occur if you take  this medication during pregnancy and for 1 month after the last dose. You will need a negative pregnancy test before starting this medication. Contraception is recommended while taking this medication and for 1 month after the last dose. Your care team can help you find the option that works for you. Do not breastfeed while taking this medication and for at least 1 month after the last dose. What side effects may I notice from receiving this medication? Side effects that you should report to your care team as soon as possible: Allergic reactions--skin rash, itching, hives, swelling of the face, lips, tongue, or throat Dry cough, shortness of breath or trouble breathing Eye pain, redness, irritation, or discharge with blurry or decreased vision Heart attack--pain or tightness in the chest, shoulders, arms, or jaw, nausea, shortness of breath, cold or clammy skin, feeling faint or lightheaded Heart muscle inflammation--unusual weakness or fatigue, shortness of breath, chest pain, fast or irregular heartbeat, dizziness, swelling of the ankles, feet, or hands Hormone gland problems--headache, sensitivity to light, unusual weakness or fatigue, dizziness, fast or irregular heartbeat, increased sensitivity to cold or heat, excessive sweating, constipation, hair loss, increased thirst or amount of urine, tremors or shaking, irritability Infusion reactions--chest pain, shortness of breath or trouble breathing, feeling faint or lightheaded Kidney injury (glomerulonephritis)--decrease in  the amount of urine, red or dark brown urine, foamy or bubbly urine, swelling of the ankles, hands, or feet Liver injury--right upper belly pain, loss of appetite, nausea, light-colored stool, dark yellow or brown urine, yellowing skin or eyes, unusual weakness or fatigue Pain, tingling, or numbness in the hands or feet, muscle weakness, change in vision, confusion or trouble speaking, loss of balance or coordination, trouble  walking, seizures Rash, fever, and swollen lymph nodes Redness, blistering, peeling, or loosening of the skin, including inside the mouth Sudden or severe stomach pain, bloody diarrhea, fever, nausea, vomiting Side effects that usually do not require medical attention (report to your care team if they continue or are bothersome): Bone, joint, or muscle pain Diarrhea Fatigue Loss of appetite Nausea Skin rash This list may not describe all possible side effects. Call your doctor for medical advice about side effects. You may report side effects to FDA at 1-800-FDA-1088. Where should I keep my medication? This medication is given in a hospital or clinic. It will not be stored at home. NOTE: This sheet is a summary. It may not cover all possible information. If you have questions about this medicine, talk to your doctor, pharmacist, or health care provider.  2023 Elsevier/Gold Standard (2021-10-02 00:00:00)

## 2022-11-15 ENCOUNTER — Telehealth: Payer: Self-pay

## 2022-11-15 NOTE — Telephone Encounter (Signed)
Morgan Torres is doing fine. She is eating, drinking, and urinating well. She knows to call the office at [phone] if she has any questions or concerns.

## 2022-11-15 NOTE — Telephone Encounter (Signed)
-----   Message from Bradd Burner, RN sent at 11/14/2022  1:57 PM EDT ----- Regarding: Dr. Arbutus Ped - first time Avelumab f/u call - pt tolerated well

## 2022-11-16 DIAGNOSIS — Z8551 Personal history of malignant neoplasm of bladder: Secondary | ICD-10-CM | POA: Diagnosis not present

## 2022-11-16 LAB — T4: T4, Total: 9.4 ug/dL (ref 4.5–12.0)

## 2022-11-21 ENCOUNTER — Telehealth: Payer: Self-pay | Admitting: Medical Oncology

## 2022-11-21 ENCOUNTER — Encounter: Payer: Self-pay | Admitting: Medical Oncology

## 2022-11-21 NOTE — Telephone Encounter (Signed)
Dental - Upper palate there is an area concerning for infection and wants to know if there are any limitations to procedures or treatment she is receiving. He is referring the pt to an oral surgeon .   He sent an email to Dr. Arbutus Ped.  Per Dr Arbutus Ped, I told Dr. Fredrik Cove that he can do what he wants to do with the area. Letter faxed to Dr Fredrik Cove office.

## 2022-11-22 ENCOUNTER — Encounter: Payer: Self-pay | Admitting: Internal Medicine

## 2022-11-26 NOTE — Progress Notes (Unsigned)
[facility] OFFICE PROGRESS NOTE  Morgan Mires, MD [address] [place] [address]  DIAGNOSIS: Metastatic urothelial carcinoma that was initially diagnosed as stage II (T2, N0, M0) in September 2022 status post right nephroureterectomy as well as intravesical treatment and resection of superficial tumor in the bladder several times and March 2023 as well as July 2023.  The patient was found to have enlarging and new pulmonary nodules consistent with metastatic disease in November 2023.   PRIOR THERAPY:  1) Right nephroureterectomy as well as intravesical treatment and resection of superficial tumor in the bladder several times and March 2023 as well as July 2023.  2) Palliative systemic chemotherapy with carboplatin for AUC of 5 on day 1 and gemcitabine 1000 mg/M2 on days 1 and 8 every 3 weeks. First cycle June 21, 2022. Status post 6 cycles. Her dose of carboplatin was reduced to AUC of 4 and gemcitabine to 800 mg/m2 starting from cycle #5 due to cytopenias.  CURRENT THERAPY: Maintenance immunotherapy with Avelumab 800 mg IV every 2 weeks. First dose expected on 11/15/22. Status post 1 cycle of treatment.   INTERVAL HISTORY: Morgan Torres 72 y.o. female returns to the clinic today for a follow-up visit.  The patient was last seen by Dr. Arbutus Ped and myself on 11/05/2022.  At that point time, the patient completed 6 cycles of chemotherapy.  At her last appointment she was started on maintenance immunotherapy.  She underwent her first cycle of treatment she tolerated well without any concerning adverse side effects.  The patient is feeling well today without any concerning complaints except she is struggling with arthritis in her right shoulder and low back.  She reports she has had 3 back surgeries.  She is also had hip surgery.  She has tried to receive steroid injections as well as an epidural injection in her back last month which did not help.  The patient is not  interested in undergoing any surgery at this time.  She reports her orthopedic provider recommended going to a pain clinic which the patient is not interested in as pain medication as it causes constipation.  She uses smooth move tea and MiraLAX for constipation.  She is not taking the stool softener.  She also uses topical Voltaren cream on her shoulder which only works temporarily.  Her fatigue is improving "a little".  She denies any fever, chills, or unexplained weight loss.  She reports stable night sweats which have been occurring for a lot of her life.  She denies any cough, hemoptysis, or chest pain.  She denied any significant dyspnea on exertion at this time. Denies any nausea, vomiting, or diarrhea.  Denies any blood in the urine.  She denies any abdominal pain unless she is having constipation.  She denies any hematuria. Denies any malodorous urine or cloudy urine.  She is here today for evaluation repeat blood work before undergoing cycle #2.  MEDICAL HISTORY: Past Medical History:  Diagnosis Date   Anxiety    Arthritis    hip, lumbar spine    Asthma    seasonal allergies    Bronchitis    Hx: of   Chronic kidney disease    COPD (chronic obstructive pulmonary disease) (HCC)    Coronary artery disease    Depression    Fibromyalgia    Hypertension    Lumbar herniated disc    Myocardial infarction (HCC) 06/25/2012   followed by Dr. Rennis Golden, treated medically, no  stents   Peripheral arterial disease (HCC)    of right foot   Pneumonia    Hx: of yrs ago   Sciatica    Sleep apnea    Small bowel obstruction (HCC)    several yrs ago    ALLERGIES:  is allergic to diclofenac, codeine, hydrocodone, and ciprofloxacin.  MEDICATIONS:  Current Outpatient Medications  Medication Sig Dispense Refill   HYDROcodone-acetaminophen (NORCO/VICODIN) 5-325 MG tablet Take 1 tablet by mouth 3 (three) times daily as needed.     acetaminophen (TYLENOL) 500 MG tablet Take 1 tablet (500 mg total) by  mouth every 6 (six) hours as needed for mild pain. 30 tablet 0   albuterol (PROVENTIL HFA;VENTOLIN HFA) 108 (90 Base) MCG/ACT inhaler Inhale 2 puffs into the lungs every 6 (six) hours as needed for wheezing or shortness of breath. 1 Inhaler 0   amLODipine (NORVASC) 5 MG tablet TAKE 1 TABLET BY MOUTH EVERY DAY 15 tablet 0   amoxicillin-clavulanate (AUGMENTIN) 875-125 MG tablet Take 1 tablet by mouth every 12 (twelve) hours. 14 tablet 0   aspirin EC 81 MG tablet Take 81 mg by mouth daily. Swallow whole.     atorvastatin (LIPITOR) 20 MG tablet Take 20 mg by mouth. Monday wed and Friday in am     cilostazol (PLETAL) 100 MG tablet Take 100 mg by mouth 2 (two) times daily.     EPINEPHrine (EPIPEN 2-PAK) 0.3 mg/0.3 mL DEVI Inject 0.3 mLs (0.3 mg total) into the muscle once as needed (for severe allergic reaction). CAll 911 immediately if you have to use this medicine 1 Device 1   lidocaine-prilocaine (EMLA) cream Apply to the Port-A-Cath site 30-60-minute before treatment 30 g 2   lidocaine-prilocaine (EMLA) cream Apply to affected area once 30 g 3   LORazepam (ATIVAN) 0.5 MG tablet Take 0.5 mg by mouth daily as needed for anxiety.     losartan-hydrochlorothiazide (HYZAAR) 100-12.5 MG tablet Take 1 tablet by mouth daily.     metoprolol tartrate (LOPRESSOR) 25 MG tablet TAKE 1 TABLET BY MOUTH 2 TIMES DAILY. CALL AND SCHEDULE FOLLOW UP APPT TO RECEIVE FURTHER REFILLS. 15 tablet 0   nicotine (NICODERM CQ - DOSED IN MG/24 HOURS) 21 mg/24hr patch Place 21 mg onto the skin daily.     nicotine polacrilex (NICORETTE) 4 MG gum Take 4 mg by mouth as needed for smoking cessation.     nitroGLYCERIN (NITROSTAT) 0.4 MG SL tablet Place 1 tablet (0.4 mg total) under the tongue every 5 (five) minutes x 3 doses as needed for chest pain. 25 tablet 2   ondansetron (ZOFRAN) 8 MG tablet Take 1 tablet (8 mg total) by mouth every 8 (eight) hours as needed for nausea or vomiting. 30 tablet 1   oxybutynin (DITROPAN) 5 MG tablet  Take 1 tablet (5 mg total) by mouth every 8 (eight) hours as needed for bladder spasms. 30 tablet 1   polyethylene glycol (MIRALAX / GLYCOLAX) 17 g packet Take 17 g by mouth as needed.     prochlorperazine (COMPAZINE) 10 MG tablet Take 1 tablet (10 mg total) by mouth every 6 (six) hours as needed for nausea or vomiting. 30 tablet 0   prochlorperazine (COMPAZINE) 10 MG tablet Take 1 tablet (10 mg total) by mouth every 6 (six) hours as needed for nausea or vomiting. 30 tablet 1   traMADol (ULTRAM) 50 MG tablet Take by mouth every 6 (six) hours as needed. Not taking     No current facility-administered medications  for this visit.    SURGICAL HISTORY:  Past Surgical History:  Procedure Laterality Date   ABDOMINAL SURGERY     resection of small intestine   ANTERIOR CRUCIATE LIGAMENT REPAIR Bilateral    BACK SURGERY     fusion of lumbar   CARDIAC CATHETERIZATION  06/25/2012   COLON SURGERY     resection for bowel - for obstruction  6 to 7 yrs ago per pt on 01-05-2022   COLONOSCOPY     Hx; of   DILATION AND CURETTAGE OF UTERUS     EYE SURGERY Bilateral    cataracts removed   HERNIA REPAIR  02/23/1969   inguinal hernia    IR IMAGING GUIDED PORT INSERTION  06/07/2022   LEFT HEART CATHETERIZATION WITH CORONARY ANGIOGRAM N/A 03/30/2013   Procedure: LEFT HEART CATHETERIZATION WITH CORONARY ANGIOGRAM;  Surgeon: Marykay Lex, MD;  Location: [facility] CATH LAB;  Service: Cardiovascular;  Laterality: N/A;   left thumb surgery     yrs ago   ROBOT ASSITED LAPAROSCOPIC NEPHROURETERECTOMY Left 03/09/2021   Procedure: XI ROBOT ASSITED LAPAROSCOPIC NEPHROURETERECTOMY/ CYSTOSCOPY WITH LEFT URETEROSCOPY WITH TRANSURETHRAL RESECTION OF URETERAL ORIFICE;  Surgeon: Rene Paci, MD;  Location: [facility] ORS;  Service: Urology;  Laterality: Left;   SALIVARY GLAND SURGERY Left    approached from inside mouth & side of neck 1970-1980   TONSILLECTOMY     as child   TOTAL HIP ARTHROPLASTY Left 05/28/2014    Procedure: LEFT TOTAL HIP ARTHROPLASTY;  Surgeon: Thera Flake., MD;  Location: [facility] OR;  Service: Orthopedics;  Laterality: Left;   TRANSURETHRAL RESECTION OF BLADDER TUMOR N/A 08/23/2021   Procedure: TRANSURETHRAL RESECTION OF BLADDER TUMOR (TURBT) WITH CYSTOSCOPY/ POSTOPERATIVE INSTILLATION OF GEMCITABINE/retrograde pyelogram and stent placement (right);  Surgeon: Rene Paci, MD;  Location: [facility];  Service: Urology;  Laterality: N/A;   TRANSURETHRAL RESECTION OF BLADDER TUMOR N/A 01/10/2022   Procedure: TRANSURETHRAL RESECTION OF BLADDER TUMOR (TURBT) WITH CYSTOSCOPY / GEMCITABINE INSTILLATION post-operatively;  Surgeon: Rene Paci, MD;  Location: [facility];  Service: Urology;  Laterality: N/A;  ONLY NEEDS 30 MIN    REVIEW OF SYSTEMS:   Review of Systems  Constitutional: Positive for stable fatigue. Negative for appetite change, chills,  fever and unexpected weight change.  HENT:   Negative for mouth sores, nosebleeds, sore throat and trouble swallowing.   Eyes: Negative for eye problems and icterus.  Respiratory: Negative for cough, hemoptysis, shortness of breath and wheezing.   Cardiovascular: Negative for chest pain and leg swelling.  Gastrointestinal: Positive for occasional constipation. Negative for abdominal pain, diarrhea, nausea and vomiting.  Genitourinary: Negative for bladder incontinence, difficulty urinating, dysuria, frequency and hematuria.   Musculoskeletal: Positive for chronic back pain and right shoulder pain. Negative for gait problem, neck pain and neck stiffness.  Skin: Negative for itching and rash.  Neurological: Negative for dizziness, extremity weakness, gait problem, headaches, light-headedness and seizures.  Hematological: Negative for adenopathy. Does not bruise/bleed easily.  Psychiatric/Behavioral: Negative for confusion, depression and sleep disturbance. The patient is not nervous/anxious.      PHYSICAL EXAMINATION:  Blood pressure 132/68, pulse 60, temperature 98.7 F (37.1 C), temperature source Oral, resp. rate 16, height 5\' 8"  (1.727 m), weight 181 lb 1.6 oz (82.1 kg), SpO2 100 %.  ECOG PERFORMANCE STATUS: 1  Physical Exam  Constitutional: Oriented to person, place, and time and well-developed, well-nourished, and in no distress.   HENT:  Head: Normocephalic and atraumatic.  Mouth/Throat: Oropharynx is clear and moist. No oropharyngeal exudate.  Eyes: Conjunctivae are normal. Right eye exhibits no discharge. Left eye exhibits no discharge. No scleral icterus.  Neck: Normal range of motion. Neck supple.  Cardiovascular: Normal rate, regular rhythm, normal heart sounds and intact distal pulses.   Pulmonary/Chest: Effort normal and breath sounds normal. No respiratory distress. No wheezes. No rales.  Abdominal: Soft. Bowel sounds are normal. Exhibits no distension and no mass. There is no tenderness.  Musculoskeletal: Normal range of motion. Exhibits no edema.  Lymphadenopathy:    No cervical adenopathy.  Neurological: Alert and oriented to person, place, and time. Exhibits normal muscle tone. Gait normal. Coordination normal.  Skin: Skin is warm and dry. No rash noted. Not diaphoretic. No erythema. No pallor.  Psychiatric: Mood, memory and judgment normal.  Vitals reviewed.  LABORATORY DATA: Lab Results  Component Value Date   WBC 9.6 11/29/2022   HGB 10.7 (L) 11/29/2022   HCT 31.6 (L) 11/29/2022   MCV 105.7 (H) 11/29/2022   PLT 261 11/29/2022      Chemistry      Component Value Date/Time   NA 139 11/14/2022 1027   K 4.0 11/14/2022 1027   CL 108 11/14/2022 1027   CO2 27 11/14/2022 1027   BUN 21 11/14/2022 1027   CREATININE 1.31 (H) 11/14/2022 1027      Component Value Date/Time   CALCIUM 8.6 (L) 11/14/2022 1027   ALKPHOS 94 11/14/2022 1027   AST 12 (L) 11/14/2022 1027   ALT 8 11/14/2022 1027   BILITOT 0.4 11/14/2022 1027       RADIOGRAPHIC  STUDIES:  CT CHEST ABDOMEN PELVIS WO CONTRAST  Result Date: 11/05/2022 CLINICAL DATA:  Bladder cancer, staging.  * Tracking Code: BO * EXAM: CT CHEST, ABDOMEN AND PELVIS WITHOUT CONTRAST TECHNIQUE: Multidetector CT imaging of the chest, abdomen and pelvis was performed following the standard protocol without IV contrast. RADIATION DOSE REDUCTION: This exam was performed according to the departmental dose-optimization program which includes automated exposure control, adjustment of the mA and/or kV according to patient size and/or use of iterative reconstruction technique. COMPARISON:  08/20/2022. FINDINGS: CT CHEST FINDINGS Cardiovascular: Right IJ Port-A-Cath terminates in the right atrium. Atherosclerotic calcification of the aorta and coronary arteries. Enlarged pulmonic trunk. Heart size normal. No pericardial effusion. Mediastinum/Nodes: No pathologically enlarged mediastinal or axillary lymph nodes. Hilar regions are difficult to definitively evaluate without IV contrast. Esophagus is grossly unremarkable. Lungs/Pleura: Centrilobular emphysema. Pulmonary nodules measure up to 1.1 x 1.4 cm in the superior segment left lower lobe (4/56), unchanged from 08/20/2022. No definite new pulmonary nodules. No pleural fluid. Airway is unremarkable. Musculoskeletal: Degenerative changes in the spine. CT ABDOMEN PELVIS FINDINGS Hepatobiliary: Probable cysts in the liver. Liver and gallbladder are otherwise unremarkable. No biliary ductal dilatation. Pancreas: Negative. Spleen: Negative. Adrenals/Urinary Tract: Adrenal glands and right kidney are unremarkable. Left nephro ureterectomy. Right ureter is decompressed. Bladder is largely obscured by streak artifact from a left hip arthroplasty. Stomach/Bowel: Stomach, small bowel and colon are unremarkable. Appendix is not readily visualized. Vascular/Lymphatic: Atherosclerotic calcification of the aorta. No pathologically enlarged lymph nodes. Reproductive: Hysterectomy.   No adnexal mass. Other: No free fluid.  Mesenteries and peritoneum are unremarkable. Musculoskeletal: Left hip arthroplasty. Degenerative changes in the spine and right hip. Postoperative changes and levoconvex curvature in the lumbar spine. No worrisome lytic or sclerotic lesions. IMPRESSION: 1. Stable pulmonary metastases without evidence of new metastatic disease. 2. Aortic atherosclerosis (ICD10-I70.0). Coronary artery calcification. 3. Enlarged pulmonic  trunk, indicative of pulmonary arterial hypertension. 4.  Emphysema (ICD10-J43.9). Electronically Signed   By: Leanna Battles M.D.   On: 11/05/2022 14:06     ASSESSMENT/PLAN:  This is a very pleasant 72 year old African-American female with metastatic urothelial carcinoma.  She was initially diagnosed as a stage II (T2, N0, M0) in September 2022.   She is status post right nephroureterectomy as well as  intravesical treatment and resection of superficial tumor in the bladder several times and March 2023 as well as July 2023.  The patient was found to have enlarging and new pulmonary nodules consistent with metastatic disease in November 2023.  Dr. Arbutus Ped sent her tissue for foundation 1 and PD-L1 expression. She does not have any actionable mutations.    She completed 6 cycles of systemic chemotherapy with carboplatin for an AUC of 5 on day 1 and gemcitabine 1000 milligrams per meter squared on days 1 and 8 IV every 3 weeks with Neulasta support.  Her dose was reduced to carboplatin for an AUC of 4 and gemcitabine 800 mg/m due to cytopenias transfusion support.  Dr. Arbutus Ped recommended maintenance immunotherapy with Avelumab 2 mg IV every 2 weeks.  She is status post her first cycle of treatment and she tolerated it well without any concerning adverse side effects.   Labs were reviewed. Her CMP is still pending. As long as this is within parameters, recommend that she proceed with cycle #2 today as schedule.  We will see her back for follow-up  visit in 2 weeks for evaluation repeat blood work before starting cycle #3.  Discussed the patient's chronic arthritis with her.  She is not interested in any surgery.  She is not interested in pain medication as a cause of constipation.  If she changes her mind about pain medication as it is unlikely her arthritis will improve on its own, then I recommend that she take a stool softener daily and laxatives as needed.  We discussed activity as tolerated, drinking plenty of fluids, and fruits and vegetables also helps reduce the risk of constipation.  The patient was advised to call immediately if she has any concerning symptoms in the interval. The patient voices understanding of current disease status and treatment options and is in agreement with the current care plan. All questions were answered. The patient knows to call the clinic with any problems, questions or concerns. We can certainly see the patient much sooner if necessary           Orders Placed This Encounter  Procedures   CBC with Differential (Cancer Center Only)    Standing Status:   Future    Standing Expiration Date:   02/07/2024   CMP (Cancer Center only)    Standing Status:   Future    Standing Expiration Date:   02/07/2024   T4    Standing Status:   Future    Standing Expiration Date:   02/07/2024   TSH    Standing Status:   Future    Standing Expiration Date:   02/07/2024   CBC with Differential (Cancer Center Only)    Standing Status:   Future    Standing Expiration Date:   02/21/2024   CMP (Cancer Center only)    Standing Status:   Future    Standing Expiration Date:   02/21/2024   T4    Standing Status:   Future    Standing Expiration Date:   02/21/2024   TSH    Standing Status:  Future    Standing Expiration Date:   02/21/2024     The total time spent in the appointment was 20-29 minutes  Samirah Scarpati L Obinna Ehresman, PA-C 11/29/22

## 2022-11-27 ENCOUNTER — Other Ambulatory Visit: Payer: Self-pay

## 2022-11-29 ENCOUNTER — Other Ambulatory Visit: Payer: Self-pay

## 2022-11-29 ENCOUNTER — Inpatient Hospital Stay: Payer: Medicare Other | Attending: Internal Medicine

## 2022-11-29 ENCOUNTER — Inpatient Hospital Stay (HOSPITAL_BASED_OUTPATIENT_CLINIC_OR_DEPARTMENT_OTHER): Payer: Medicare Other | Admitting: Physician Assistant

## 2022-11-29 ENCOUNTER — Inpatient Hospital Stay: Payer: Medicare Other

## 2022-11-29 VITALS — BP 105/62 | HR 62 | Resp 17

## 2022-11-29 VITALS — BP 132/68 | HR 60 | Temp 98.7°F | Resp 16 | Ht 68.0 in | Wt 181.1 lb

## 2022-11-29 DIAGNOSIS — Z792 Long term (current) use of antibiotics: Secondary | ICD-10-CM | POA: Insufficient documentation

## 2022-11-29 DIAGNOSIS — C679 Malignant neoplasm of bladder, unspecified: Secondary | ICD-10-CM

## 2022-11-29 DIAGNOSIS — C7910 Secondary malignant neoplasm of unspecified urinary organs: Secondary | ICD-10-CM | POA: Diagnosis not present

## 2022-11-29 DIAGNOSIS — Z7902 Long term (current) use of antithrombotics/antiplatelets: Secondary | ICD-10-CM | POA: Diagnosis not present

## 2022-11-29 DIAGNOSIS — Z5112 Encounter for antineoplastic immunotherapy: Secondary | ICD-10-CM | POA: Diagnosis not present

## 2022-11-29 DIAGNOSIS — G8929 Other chronic pain: Secondary | ICD-10-CM | POA: Insufficient documentation

## 2022-11-29 DIAGNOSIS — K5900 Constipation, unspecified: Secondary | ICD-10-CM | POA: Insufficient documentation

## 2022-11-29 DIAGNOSIS — J069 Acute upper respiratory infection, unspecified: Secondary | ICD-10-CM | POA: Diagnosis not present

## 2022-11-29 DIAGNOSIS — Z885 Allergy status to narcotic agent status: Secondary | ICD-10-CM | POA: Diagnosis not present

## 2022-11-29 DIAGNOSIS — Z9089 Acquired absence of other organs: Secondary | ICD-10-CM | POA: Insufficient documentation

## 2022-11-29 DIAGNOSIS — Z515 Encounter for palliative care: Secondary | ICD-10-CM | POA: Diagnosis not present

## 2022-11-29 DIAGNOSIS — Z881 Allergy status to other antibiotic agents status: Secondary | ICD-10-CM | POA: Insufficient documentation

## 2022-11-29 DIAGNOSIS — Z79899 Other long term (current) drug therapy: Secondary | ICD-10-CM | POA: Diagnosis not present

## 2022-11-29 DIAGNOSIS — Z95828 Presence of other vascular implants and grafts: Secondary | ICD-10-CM

## 2022-11-29 DIAGNOSIS — J432 Centrilobular emphysema: Secondary | ICD-10-CM | POA: Insufficient documentation

## 2022-11-29 DIAGNOSIS — R61 Generalized hyperhidrosis: Secondary | ICD-10-CM | POA: Diagnosis not present

## 2022-11-29 DIAGNOSIS — M797 Fibromyalgia: Secondary | ICD-10-CM | POA: Diagnosis not present

## 2022-11-29 DIAGNOSIS — C7800 Secondary malignant neoplasm of unspecified lung: Secondary | ICD-10-CM

## 2022-11-29 DIAGNOSIS — I252 Old myocardial infarction: Secondary | ICD-10-CM | POA: Diagnosis not present

## 2022-11-29 DIAGNOSIS — I700 Atherosclerosis of aorta: Secondary | ICD-10-CM | POA: Insufficient documentation

## 2022-11-29 DIAGNOSIS — M19011 Primary osteoarthritis, right shoulder: Secondary | ICD-10-CM | POA: Diagnosis not present

## 2022-11-29 DIAGNOSIS — J4489 Other specified chronic obstructive pulmonary disease: Secondary | ICD-10-CM | POA: Diagnosis not present

## 2022-11-29 DIAGNOSIS — D649 Anemia, unspecified: Secondary | ICD-10-CM | POA: Insufficient documentation

## 2022-11-29 LAB — CBC WITH DIFFERENTIAL (CANCER CENTER ONLY)
Abs Immature Granulocytes: 0.02 10*3/uL (ref 0.00–0.07)
Basophils Absolute: 0.1 10*3/uL (ref 0.0–0.1)
Basophils Relative: 1 %
Eosinophils Absolute: 0.2 10*3/uL (ref 0.0–0.5)
Eosinophils Relative: 2 %
HCT: 31.6 % — ABNORMAL LOW (ref 36.0–46.0)
Hemoglobin: 10.7 g/dL — ABNORMAL LOW (ref 12.0–15.0)
Immature Granulocytes: 0 %
Lymphocytes Relative: 22 %
Lymphs Abs: 2.1 10*3/uL (ref 0.7–4.0)
MCH: 35.8 pg — ABNORMAL HIGH (ref 26.0–34.0)
MCHC: 33.9 g/dL (ref 30.0–36.0)
MCV: 105.7 fL — ABNORMAL HIGH (ref 80.0–100.0)
Monocytes Absolute: 1.1 10*3/uL — ABNORMAL HIGH (ref 0.1–1.0)
Monocytes Relative: 12 %
Neutro Abs: 6.1 10*3/uL (ref 1.7–7.7)
Neutrophils Relative %: 63 %
Platelet Count: 261 10*3/uL (ref 150–400)
RBC: 2.99 MIL/uL — ABNORMAL LOW (ref 3.87–5.11)
RDW: 16.7 % — ABNORMAL HIGH (ref 11.5–15.5)
WBC Count: 9.6 10*3/uL (ref 4.0–10.5)
nRBC: 0 % (ref 0.0–0.2)

## 2022-11-29 LAB — CMP (CANCER CENTER ONLY)
ALT: 10 U/L (ref 0–44)
AST: 11 U/L — ABNORMAL LOW (ref 15–41)
Albumin: 3.6 g/dL (ref 3.5–5.0)
Alkaline Phosphatase: 88 U/L (ref 38–126)
Anion gap: 3 — ABNORMAL LOW (ref 5–15)
BUN: 24 mg/dL — ABNORMAL HIGH (ref 8–23)
CO2: 28 mmol/L (ref 22–32)
Calcium: 8.9 mg/dL (ref 8.9–10.3)
Chloride: 108 mmol/L (ref 98–111)
Creatinine: 1.48 mg/dL — ABNORMAL HIGH (ref 0.44–1.00)
GFR, Estimated: 37 mL/min — ABNORMAL LOW (ref >60)
Glucose, Bld: 94 mg/dL (ref 70–99)
Potassium: 4.2 mmol/L (ref 3.5–5.1)
Sodium: 139 mmol/L (ref 135–145)
Total Bilirubin: 0.3 mg/dL (ref 0.3–1.2)
Total Protein: 6.4 g/dL — ABNORMAL LOW (ref 6.5–8.1)

## 2022-11-29 LAB — SAMPLE TO BLOOD BANK

## 2022-11-29 MED ORDER — SODIUM CHLORIDE 0.9% FLUSH
10.0000 mL | INTRAVENOUS | Status: DC | PRN
  Administered 2022-11-29: 10 mL

## 2022-11-29 MED ORDER — DIPHENHYDRAMINE HCL 50 MG/ML IJ SOLN
25.0000 mg | Freq: Once | INTRAMUSCULAR | Status: AC
  Administered 2022-11-29: 25 mg via INTRAVENOUS
  Filled 2022-11-29: qty 1

## 2022-11-29 MED ORDER — HEPARIN SOD (PORK) LOCK FLUSH 100 UNIT/ML IV SOLN
500.0000 [IU] | Freq: Once | INTRAVENOUS | Status: AC | PRN
  Administered 2022-11-29: 500 [IU]

## 2022-11-29 MED ORDER — SODIUM CHLORIDE 0.9% FLUSH
10.0000 mL | Freq: Once | INTRAVENOUS | Status: AC
  Administered 2022-11-29: 10 mL

## 2022-11-29 MED ORDER — ACETAMINOPHEN 325 MG PO TABS
650.0000 mg | ORAL_TABLET | Freq: Once | ORAL | Status: AC
  Administered 2022-11-29: 650 mg via ORAL
  Filled 2022-11-29: qty 2

## 2022-11-29 MED ORDER — SODIUM CHLORIDE 0.9 % IV SOLN: Freq: Once | INTRAVENOUS | Status: AC

## 2022-11-29 MED ORDER — SODIUM CHLORIDE 0.9 % IV SOLN
800.0000 mg | Freq: Once | INTRAVENOUS | Status: AC
  Administered 2022-11-29: 800 mg via INTRAVENOUS
  Filled 2022-11-29: qty 40

## 2022-11-29 NOTE — Patient Instructions (Signed)
[facility]  Discharge Instructions: Thank you for choosing [facility] to provide your oncology and hematology care.   If you have a lab appointment with the Cancer Center, please go directly to the Cancer Center and check in at the registration area.   Wear comfortable clothing and clothing appropriate for easy access to any Portacath or PICC line.   We strive to give you quality time with your provider. You may need to reschedule your appointment if you arrive late (15 or more minutes).  Arriving late affects you and other patients whose appointments are after yours.  Also, if you miss three or more appointments without notifying the office, you may be dismissed from the clinic at the provider's discretion.      For prescription refill requests, have your pharmacy contact our office and allow 72 hours for refills to be completed.    Today you received the following chemotherapy and/or immunotherapy agents: Avelumab (Bavencio)      To help prevent nausea and vomiting after your treatment, we encourage you to take your nausea medication as directed.  BELOW ARE SYMPTOMS THAT SHOULD BE REPORTED IMMEDIATELY: *FEVER GREATER THAN 100.4 F (38 C) OR HIGHER *CHILLS OR SWEATING *NAUSEA AND VOMITING THAT IS NOT CONTROLLED WITH YOUR NAUSEA MEDICATION *UNUSUAL SHORTNESS OF BREATH *UNUSUAL BRUISING OR BLEEDING *URINARY PROBLEMS (pain or burning when urinating, or frequent urination) *BOWEL PROBLEMS (unusual diarrhea, constipation, pain near the anus) TENDERNESS IN MOUTH AND THROAT WITH OR WITHOUT PRESENCE OF ULCERS (sore throat, sores in mouth, or a toothache) UNUSUAL RASH, SWELLING OR PAIN  UNUSUAL VAGINAL DISCHARGE OR ITCHING   Items with * indicate a potential emergency and should be followed up as soon as possible or go to the Emergency Department if any problems should occur.  Please show the CHEMOTHERAPY ALERT CARD or IMMUNOTHERAPY ALERT  CARD at check-in to the Emergency Department and triage nurse.  Should you have questions after your visit or need to cancel or reschedule your appointment, please contact [facility]  Dept: [phone]  and follow the prompts.  Office hours are 8:00 a.m. to 4:30 p.m. Monday - Friday. Please note that voicemails left after 4:00 p.m. may not be returned until the following business day.  We are closed weekends and major holidays. You have access to a nurse at all times for urgent questions. Please call the main number to the clinic Dept: [phone] and follow the prompts.   For any non-urgent questions, you may also contact your provider using MyChart. We now offer e-Visits for anyone 18 and older to request care online for non-urgent symptoms. For details visit [email or web address].   Also download the MyChart app! Go to the app store, search "MyChart", open the app, select , and log in with your MyChart username and password.  Avelumab Injection What is this medication? AVELUMAB (a VEL ue mab) treats some types of cancer. It works by helping your immune system slow or stop the spread of cancer cells. It is a monoclonal antibody. This medicine may be used for other purposes; ask your health care provider or pharmacist if you have questions. COMMON BRAND NAME(S): BAVENCIO What should I tell my care team before I take this medication? They need to know if you have any of these conditions: Allogeneic stem cell transplant (uses someone else's stem cells) Autoimmune conditions, such as Crohn disease, ulcerative colitis, lupus Diabetes Heart disease High blood   pressure High cholesterol History of chest radiation Nervous system problems, such as Guillain-Barre syndrome or myasthenia gravis Organ transplant An unusual or allergic reaction to avelumab, other medications, foods, dyes, or preservatives Pregnant or trying to get  pregnant Breastfeeding How should I use this medication? This medication is injected into a vein. It is given by your care team in a hospital or clinic setting. A special MedGuide will be given to you before each treatment. Be sure to read this information carefully each time. Talk to your care team about the use of this medication in children. While it may be prescribed for children as young as 12 years for selected conditions, precautions do apply. Overdosage: If you think you have taken too much of this medicine contact a poison control center or emergency room at once. NOTE: This medicine is only for you. Do not share this medicine with others. What if I miss a dose? Keep appointments for follow-up doses. It is important not to miss your dose. Call your care team if you are unable to keep an appointment. What may interact with this medication? Interactions have not been studied. This list may not describe all possible interactions. Give your health care provider a list of all the medicines, herbs, non-prescription drugs, or dietary supplements you use. Also tell them if you smoke, drink alcohol, or use illegal drugs. Some items may interact with your medicine. What should I watch for while using this medication? Your condition will be monitored carefully while you are receiving this medication. You may need blood work while taking this medication. This medication may cause serious skin reactions. They can happen weeks to months after starting the medication. Contact your care team right away if you notice fevers or flu-like symptoms with a rash. The rash may be red or purple and then turn into blisters or peeling of the skin. You may also notice a red rash with swelling of the face, lips, or lymph nodes in your neck or under your arms. Tell your care team right away if you have any change in your eyesight. Talk to your care team if you may be pregnant. Serious birth defects can occur if you take  this medication during pregnancy and for 1 month after the last dose. You will need a negative pregnancy test before starting this medication. Contraception is recommended while taking this medication and for 1 month after the last dose. Your care team can help you find the option that works for you. Do not breastfeed while taking this medication and for at least 1 month after the last dose. What side effects may I notice from receiving this medication? Side effects that you should report to your care team as soon as possible: Allergic reactions--skin rash, itching, hives, swelling of the face, lips, tongue, or throat Dry cough, shortness of breath or trouble breathing Eye pain, redness, irritation, or discharge with blurry or decreased vision Heart attack--pain or tightness in the chest, shoulders, arms, or jaw, nausea, shortness of breath, cold or clammy skin, feeling faint or lightheaded Heart muscle inflammation--unusual weakness or fatigue, shortness of breath, chest pain, fast or irregular heartbeat, dizziness, swelling of the ankles, feet, or hands Hormone gland problems--headache, sensitivity to light, unusual weakness or fatigue, dizziness, fast or irregular heartbeat, increased sensitivity to cold or heat, excessive sweating, constipation, hair loss, increased thirst or amount of urine, tremors or shaking, irritability Infusion reactions--chest pain, shortness of breath or trouble breathing, feeling faint or lightheaded Kidney injury (glomerulonephritis)--decrease in   the amount of urine, red or dark brown urine, foamy or bubbly urine, swelling of the ankles, hands, or feet Liver injury--right upper belly pain, loss of appetite, nausea, light-colored stool, dark yellow or brown urine, yellowing skin or eyes, unusual weakness or fatigue Pain, tingling, or numbness in the hands or feet, muscle weakness, change in vision, confusion or trouble speaking, loss of balance or coordination, trouble  walking, seizures Rash, fever, and swollen lymph nodes Redness, blistering, peeling, or loosening of the skin, including inside the mouth Sudden or severe stomach pain, bloody diarrhea, fever, nausea, vomiting Side effects that usually do not require medical attention (report to your care team if they continue or are bothersome): Bone, joint, or muscle pain Diarrhea Fatigue Loss of appetite Nausea Skin rash This list may not describe all possible side effects. Call your doctor for medical advice about side effects. You may report side effects to FDA at 1-800-FDA-1088. Where should I keep my medication? This medication is given in a hospital or clinic. It will not be stored at home. NOTE: This sheet is a summary. It may not cover all possible information. If you have questions about this medicine, talk to your doctor, pharmacist, or health care provider.  2023 Elsevier/Gold Standard (2021-10-02 00:00:00)  

## 2022-11-30 ENCOUNTER — Other Ambulatory Visit: Payer: Self-pay

## 2022-12-04 DIAGNOSIS — M25511 Pain in right shoulder: Secondary | ICD-10-CM | POA: Diagnosis not present

## 2022-12-04 DIAGNOSIS — M542 Cervicalgia: Secondary | ICD-10-CM | POA: Diagnosis not present

## 2022-12-06 ENCOUNTER — Telehealth: Payer: Self-pay | Admitting: Medical Oncology

## 2022-12-06 NOTE — Telephone Encounter (Signed)
Itching x 3 days arms, head -had a steroid injection before at ortho. 06/06-cycle 2 Avelumab. Denies redness , rash or bumps . Took benadryl wed and  today and it calmed down the itching. I told her to continue taking benadryl and call if symptom worsens.

## 2022-12-07 ENCOUNTER — Ambulatory Visit (INDEPENDENT_AMBULATORY_CARE_PROVIDER_SITE_OTHER): Payer: Medicare Other

## 2022-12-07 ENCOUNTER — Ambulatory Visit (HOSPITAL_COMMUNITY)
Admission: EM | Admit: 2022-12-07 | Discharge: 2022-12-07 | Disposition: A | Discharge status: Home / Self Care | Payer: Medicare Other | Attending: Physician Assistant | Admitting: Physician Assistant

## 2022-12-07 ENCOUNTER — Encounter (HOSPITAL_COMMUNITY): Payer: Self-pay | Admitting: Emergency Medicine

## 2022-12-07 DIAGNOSIS — Z1152 Encounter for screening for COVID-19: Secondary | ICD-10-CM | POA: Diagnosis not present

## 2022-12-07 DIAGNOSIS — R059 Cough, unspecified: Secondary | ICD-10-CM | POA: Insufficient documentation

## 2022-12-07 DIAGNOSIS — J441 Chronic obstructive pulmonary disease with (acute) exacerbation: Secondary | ICD-10-CM

## 2022-12-07 DIAGNOSIS — R911 Solitary pulmonary nodule: Secondary | ICD-10-CM | POA: Diagnosis not present

## 2022-12-07 LAB — CBC WITH DIFFERENTIAL/PLATELET
Abs Immature Granulocytes: 0.04 10*3/uL (ref 0.00–0.07)
Basophils Absolute: 0 10*3/uL (ref 0.0–0.1)
Basophils Relative: 0 %
Eosinophils Absolute: 0.1 10*3/uL (ref 0.0–0.5)
Eosinophils Relative: 1 %
HCT: 37.3 % (ref 36.0–46.0)
Hemoglobin: 12.2 g/dL (ref 12.0–15.0)
Immature Granulocytes: 0 %
Lymphocytes Relative: 12 %
Lymphs Abs: 1.6 10*3/uL (ref 0.7–4.0)
MCH: 34.6 pg — ABNORMAL HIGH (ref 26.0–34.0)
MCHC: 32.7 g/dL (ref 30.0–36.0)
MCV: 105.7 fL — ABNORMAL HIGH (ref 80.0–100.0)
Monocytes Absolute: 1.2 10*3/uL — ABNORMAL HIGH (ref 0.1–1.0)
Monocytes Relative: 10 %
Neutro Abs: 9.8 10*3/uL — ABNORMAL HIGH (ref 1.7–7.7)
Neutrophils Relative %: 77 %
Platelets: 243 10*3/uL (ref 150–400)
RBC: 3.53 MIL/uL — ABNORMAL LOW (ref 3.87–5.11)
RDW: 15.4 % (ref 11.5–15.5)
WBC: 12.8 10*3/uL — ABNORMAL HIGH (ref 4.0–10.5)
nRBC: 0 % (ref 0.0–0.2)

## 2022-12-07 LAB — COMPREHENSIVE METABOLIC PANEL
ALT: 18 U/L (ref 0–44)
AST: 27 U/L (ref 15–41)
Albumin: 3.4 g/dL — ABNORMAL LOW (ref 3.5–5.0)
Alkaline Phosphatase: 80 U/L (ref 38–126)
Anion gap: 14 (ref 5–15)
BUN: 34 mg/dL — ABNORMAL HIGH (ref 8–23)
CO2: 19 mmol/L — ABNORMAL LOW (ref 22–32)
Calcium: 9.2 mg/dL (ref 8.9–10.3)
Chloride: 101 mmol/L (ref 98–111)
Creatinine, Ser: 1.44 mg/dL — ABNORMAL HIGH (ref 0.44–1.00)
GFR, Estimated: 38 mL/min — ABNORMAL LOW (ref >60)
Glucose, Bld: 188 mg/dL — ABNORMAL HIGH (ref 70–99)
Potassium: 4.8 mmol/L (ref 3.5–5.1)
Sodium: 134 mmol/L — ABNORMAL LOW (ref 135–145)
Total Bilirubin: 0.5 mg/dL (ref 0.3–1.2)
Total Protein: 6.8 g/dL (ref 6.5–8.1)

## 2022-12-07 LAB — POCT INFLUENZA A/B
Influenza A, POC: NEGATIVE
Influenza B, POC: NEGATIVE

## 2022-12-07 MED ORDER — AMOXICILLIN-POT CLAVULANATE 875-125 MG PO TABS: 1.0000 | ORAL_TABLET | Freq: Two times a day (BID) | ORAL | 0 refills | Status: DC

## 2022-12-07 NOTE — ED Provider Notes (Signed)
[facility]    CSN: [id number] Arrival date & time: 12/07/22  1647      History   Chief Complaint Chief Complaint  Patient presents with   Cough   Migraine   Nasal Congestion    HPI Morgan Torres is a 72 y.o. female.   Patient presents today with a several day history of URI symptoms including cough, congestion.  She reports that her grandchild has recently been visiting and was sick with similar symptoms before her symptoms began.  She has tried Mucinex without improvement of symptoms.  She does have a history of seasonal allergies and asthma.  She also has a history of COPD but has not been smoking as much recently.  She does take albuterol regularly but has not required this since symptoms began.  She is not taking any maintenance medications at this time.  She is actively being treated for malignancy and had her last dose of chemo approximately 2 weeks ago.  She will be undergoing a different type of treatment and is scheduled to start this next week.  She denies any recent antibiotics or steroids.  She denies any associated fever.  Also denies chest pain, shortness of breath, nausea, vomiting.    Past Medical History:  Diagnosis Date   Anxiety    Arthritis    hip, lumbar spine    Asthma    seasonal allergies    Bronchitis    Hx: of   Chronic kidney disease    COPD (chronic obstructive pulmonary disease) (HCC)    Coronary artery disease    Depression    Fibromyalgia    Hypertension    Lumbar herniated disc    Myocardial infarction (HCC) 06/25/2012   followed by Dr. Rennis Golden, treated medically, no stents   Peripheral arterial disease (HCC)    of right foot   Pneumonia    Hx: of yrs ago   Sciatica    Sleep apnea    Small bowel obstruction (HCC)    several yrs ago    Patient Active Problem List   Diagnosis Date Noted   Encounter for antineoplastic immunotherapy 11/29/2022   Goals of care, counseling/discussion 11/05/2022   Port-A-Cath in place  07/12/2022   Metastatic urothelial carcinoma (HCC) 05/30/2022   Bladder cancer metastasized to lung (HCC) 05/30/2022   Encounter for antineoplastic chemotherapy 05/30/2022   Renal mass 03/09/2021   Morbid obesity, unspecified obesity type (HCC) 08/28/2016   Non-restorative sleep 08/28/2016   Snoring 08/28/2016   Hip pain 06/03/2014   Acute blood loss anemia 06/01/2014   Osteoarthritis of left hip 05/28/2014   Obesity (BMI 30.0-34.9) 04/20/2013   Nonischemic cardiomyopathy (HCC) 04/01/2013   Smoking 04/01/2013   NSTEMI - Troponin pk 1.5 with ant TWI on EKG 03/29/2013   Spinal stenosis, lumbar region, with neurogenic claudication 01/23/2013   VAGINAL TRICHOMONIASIS 06/28/2006   Hyperlipidemia 06/28/2006   ANEMIA-NOS 06/28/2006   ANXIETY 06/28/2006   DEPRESSION 06/28/2006   SYNDROME, CARPAL TUNNEL 06/28/2006   Essential hypertension 06/28/2006   ALLERGIC RHINITIS 06/28/2006   ASTHMA 06/28/2006   PAROTIDITIS 06/28/2006   LOW BACK PAIN 06/28/2006   TB SKIN TEST, POSITIVE, HX OF 06/28/2006    Past Surgical History:  Procedure Laterality Date   ABDOMINAL SURGERY     resection of small intestine   ANTERIOR CRUCIATE LIGAMENT REPAIR Bilateral    BACK SURGERY     fusion of lumbar   CARDIAC CATHETERIZATION  06/25/2012   COLON SURGERY  resection for bowel - for obstruction  6 to 7 yrs ago per pt on 01-05-2022   COLONOSCOPY     Hx; of   DILATION AND CURETTAGE OF UTERUS     EYE SURGERY Bilateral    cataracts removed   HERNIA REPAIR  02/23/1969   inguinal hernia    IR IMAGING GUIDED PORT INSERTION  06/07/2022   LEFT HEART CATHETERIZATION WITH CORONARY ANGIOGRAM N/A 03/30/2013   Procedure: LEFT HEART CATHETERIZATION WITH CORONARY ANGIOGRAM;  Surgeon: Marykay Lex, MD;  Location: [facility] CATH LAB;  Service: Cardiovascular;  Laterality: N/A;   left thumb surgery     yrs ago   ROBOT ASSITED LAPAROSCOPIC NEPHROURETERECTOMY Left 03/09/2021   Procedure: XI ROBOT ASSITED LAPAROSCOPIC  NEPHROURETERECTOMY/ CYSTOSCOPY WITH LEFT URETEROSCOPY WITH TRANSURETHRAL RESECTION OF URETERAL ORIFICE;  Surgeon: Rene Paci, MD;  Location: [facility] ORS;  Service: Urology;  Laterality: Left;   SALIVARY GLAND SURGERY Left    approached from inside mouth & side of neck 1970-1980   TONSILLECTOMY     as child   TOTAL HIP ARTHROPLASTY Left 05/28/2014   Procedure: LEFT TOTAL HIP ARTHROPLASTY;  Surgeon: Thera Flake., MD;  Location: [facility];  Service: Orthopedics;  Laterality: Left;   TRANSURETHRAL RESECTION OF BLADDER TUMOR N/A 08/23/2021   Procedure: TRANSURETHRAL RESECTION OF BLADDER TUMOR (TURBT) WITH CYSTOSCOPY/ POSTOPERATIVE INSTILLATION OF GEMCITABINE/retrograde pyelogram and stent placement (right);  Surgeon: Rene Paci, MD;  Location: [facility];  Service: Urology;  Laterality: N/A;   TRANSURETHRAL RESECTION OF BLADDER TUMOR N/A 01/10/2022   Procedure: TRANSURETHRAL RESECTION OF BLADDER TUMOR (TURBT) WITH CYSTOSCOPY / GEMCITABINE INSTILLATION post-operatively;  Surgeon: Rene Paci, MD;  Location: [facility];  Service: Urology;  Laterality: N/A;  ONLY NEEDS 30 MIN    OB History   No obstetric history on file.      Home Medications    Prior to Admission medications   Medication Sig Start Date End Date Taking? Authorizing Provider  losartan (COZAAR) 100 MG tablet Take 100 mg by mouth daily. 12/05/22  Yes [provider]  acetaminophen (TYLENOL) 500 MG tablet Take 1 tablet (500 mg total) by mouth every 6 (six) hours as needed for mild pain. 10/14/22   Rising, Lurena Joiner, PA-C  albuterol (PROVENTIL HFA;VENTOLIN HFA) 108 (90 Base) MCG/ACT inhaler Inhale 2 puffs into the lungs every 6 (six) hours as needed for wheezing or shortness of breath. 05/10/16   Sudie Grumbling, NP  amLODipine (NORVASC) 5 MG tablet TAKE 1 TABLET BY MOUTH EVERY DAY 11/08/22   Hilty, Lisette Abu, MD  amoxicillin-clavulanate (AUGMENTIN) 875-125 MG  tablet Take 1 tablet by mouth every 12 (twelve) hours. 12/07/22   Enora Trillo, Noberto Retort, PA-C  aspirin EC 81 MG tablet Take 81 mg by mouth daily. Swallow whole.    [provider]  atorvastatin (LIPITOR) 20 MG tablet Take 20 mg by mouth. Monday wed and Friday in am 11/25/20   [provider]  cilostazol (PLETAL) 100 MG tablet Take 100 mg by mouth 2 (two) times daily.    [provider]  EPINEPHrine (EPIPEN 2-PAK) 0.3 mg/0.3 mL DEVI Inject 0.3 mLs (0.3 mg total) into the muscle once as needed (for severe allergic reaction). CAll 911 immediately if you have to use this medicine 11/26/12   Piepenbrink, Victorino Dike, PA-C  HYDROcodone-acetaminophen (NORCO/VICODIN) 5-325 MG tablet Take 1 tablet by mouth 3 (three) times daily as needed. 11/12/22   [provider]  lidocaine-prilocaine (EMLA) cream Apply  to the Port-A-Cath site 30-60-minute before treatment 08/14/22   Heilingoetter, Cassandra L, PA-C  lidocaine-prilocaine (EMLA) cream Apply to affected area once 11/14/22   Si Gaul, MD  LORazepam (ATIVAN) 0.5 MG tablet Take 0.5 mg by mouth daily as needed for anxiety.    [provider]  losartan-hydrochlorothiazide (HYZAAR) 100-12.5 MG tablet Take 1 tablet by mouth daily.    [provider]  metoprolol tartrate (LOPRESSOR) 25 MG tablet TAKE 1 TABLET BY MOUTH 2 TIMES DAILY. CALL AND SCHEDULE FOLLOW UP APPT TO RECEIVE FURTHER REFILLS. 11/08/22   Little Ishikawa, MD  nicotine (NICODERM CQ - DOSED IN MG/24 HOURS) 21 mg/24hr patch Place 21 mg onto the skin daily.    [provider]  nicotine polacrilex (NICORETTE) 4 MG gum Take 4 mg by mouth as needed for smoking cessation.    [provider]  nitroGLYCERIN (NITROSTAT) 0.4 MG SL tablet Place 1 tablet (0.4 mg total) under the tongue every 5 (five) minutes x 3 doses as needed for chest pain. 09/25/18   Hilty, Lisette Abu, MD  ondansetron (ZOFRAN) 8 MG tablet Take 1 tablet (8 mg total) by mouth every 8  (eight) hours as needed for nausea or vomiting. 11/14/22   Si Gaul, MD  oxybutynin (DITROPAN) 5 MG tablet Take 1 tablet (5 mg total) by mouth every 8 (eight) hours as needed for bladder spasms. 08/23/21   Rene Paci, MD  polyethylene glycol (MIRALAX / GLYCOLAX) 17 g packet Take 17 g by mouth as needed.    [provider]  prochlorperazine (COMPAZINE) 10 MG tablet Take 1 tablet (10 mg total) by mouth every 6 (six) hours as needed for nausea or vomiting. 06/21/22   Si Gaul, MD  prochlorperazine (COMPAZINE) 10 MG tablet Take 1 tablet (10 mg total) by mouth every 6 (six) hours as needed for nausea or vomiting. 11/14/22   Si Gaul, MD  traMADol (ULTRAM) 50 MG tablet Take by mouth every 6 (six) hours as needed. Not taking    [provider]    Family History Family History  Problem Relation Age of Onset   Hypertension Mother    Diabetes Mother    Cancer - Other Mother    Cancer Sister    Breast cancer Daughter     Social History Social History   Tobacco Use   Smoking status: Some Days    Packs/day: 0.50    Years: 25.00    Additional pack years: 0.00    Total pack years: 12.50    Types: Cigarettes   Smokeless tobacco: Never   Tobacco comments:    Currently on the Nicoderm patch."smokes a few"  Vaping Use   Vaping Use: Never used  Substance Use Topics   Alcohol use: Yes    Comment: occasionally   Drug use: No     Allergies   Diclofenac, Codeine, Hydrocodone, and Ciprofloxacin   Review of Systems Review of Systems  Constitutional:  Positive for activity change. Negative for appetite change, fatigue and fever.  HENT:  Positive for congestion and sore throat. Negative for sinus pressure and sneezing.   Respiratory:  Positive for cough. Negative for shortness of breath.   Cardiovascular:  Negative for chest pain.  Gastrointestinal:  Negative for abdominal pain, diarrhea, nausea and vomiting.  Neurological:  Negative for  dizziness, light-headedness and headaches.     Physical Exam Triage Vital Signs ED Triage Vitals  Enc Vitals Group     BP 12/07/22 1658 (!) 161/72  Pulse Rate 12/07/22 1658 97     Resp 12/07/22 1658 (!) 22     Temp 12/07/22 1658 98.3 F (36.8 C)     Temp Source 12/07/22 1658 Oral     SpO2 12/07/22 1658 98 %     Weight --      Height --      Head Circumference --      Peak Flow --      Pain Score 12/07/22 1657 4     Pain Loc --      Pain Edu? --      Excl. in GC? --    No data found.  Updated Vital Signs BP 132/77 (BP Location: Left Arm)   Pulse 98   Temp 98.3 F (36.8 C) (Oral)   Resp 18   SpO2 98%   Visual Acuity Right Eye Distance:   Left Eye Distance:   Bilateral Distance:    Right Eye Near:   Left Eye Near:    Bilateral Near:     Physical Exam Vitals reviewed.  Constitutional:      General: She is awake. She is not in acute distress.    Appearance: Normal appearance. She is well-developed. She is not ill-appearing.     Comments: Very pleasant female appears stated age in no acute distress sitting comfortably in exam room  HENT:     Head: Normocephalic and atraumatic.     Right Ear: Tympanic membrane, ear canal and external ear normal. Tympanic membrane is not erythematous or bulging.     Left Ear: Tympanic membrane, ear canal and external ear normal. Tympanic membrane is not erythematous or bulging.     Nose:     Right Sinus: No maxillary sinus tenderness or frontal sinus tenderness.     Left Sinus: No maxillary sinus tenderness or frontal sinus tenderness.     Mouth/Throat:     Pharynx: Uvula midline. No oropharyngeal exudate or posterior oropharyngeal erythema.  Cardiovascular:     Rate and Rhythm: Normal rate and regular rhythm.     Heart sounds: Normal heart sounds, S1 normal and S2 normal. No murmur heard. Pulmonary:     Effort: Pulmonary effort is normal.     Breath sounds: Normal breath sounds. No wheezing, rhonchi or rales.     Comments:  Clear to auscultation bilaterally Psychiatric:        Behavior: Behavior is cooperative.      UC Treatments / Results  Labs (all labs ordered are listed, but only abnormal results are displayed) Labs Reviewed  SARS CORONAVIRUS 2 (TAT 6-24 HRS)  CBC WITH DIFFERENTIAL/PLATELET  COMPREHENSIVE METABOLIC PANEL  POCT INFLUENZA A/B    EKG   Radiology DG Chest 2 View  Result Date: 12/07/2022 CLINICAL DATA:  Cough EXAM: CHEST - 2 VIEW COMPARISON:  Previous studies including CT chest done on 11/03/2022 and chest radiographs done on 03/27/2015 FINDINGS: Cardiac size is within normal limits. There are no signs of pulmonary edema or new focal infiltrates. There is no pleural effusion or pneumothorax. There is 1.7 cm nodular density in left parahilar region corresponding to the lung nodule seen in the superior segment of left lower lobe. There is a faint 6 mm nodular density in the lateral aspect of left upper lung field. Tip of right IJ chest port is seen in superior vena cava. IMPRESSION: There are no signs of pulmonary edema or focal pulmonary consolidation. There is no pleural effusion or pneumothorax. There are 2 faint nodular densities in  left upper lung field consistent with the metastatic lesions seen in previous CT. Electronically Signed   By: Ernie Avena M.D.   On: 12/07/2022 17:58    Procedures Procedures (including critical care time)  Medications Ordered in UC Medications - No data to display  Initial Impression / Assessment and Plan / UC Course  I have reviewed the triage vital signs and the nursing notes.  Pertinent labs & imaging results that were available during my care of the patient were reviewed by me and considered in my medical decision making (see chart for details).     Patient is well-appearing, afebrile, nontoxic, nontachycardic.  Chest x-ray was obtained given her history and active chemotherapy treatment that showed no acute cardiopulmonary disease.  Flu  testing was negative.  COVID test is pending.  Basic labs including CBC and CMP were obtained and we discussed that if she has a significant white count or any abnormalities would need to go to the emergency room.  We will treat for COPD exacerbation with Augmentin twice daily.  Discussed that she should monitor her temperature regularly and if she develops a fever at any point she must go to the emergency room immediately.  Recommended close follow-up with her primary care first thing next week.  We discussed that if anything changes or worsens at all she should have a very low threshold for going to the ER given her medical complexities and active treatment plans.  All questions were answered to patient satisfaction.  Final Clinical Impressions(s) / UC Diagnoses   Final diagnoses:  COPD exacerbation (HCC)     Discharge Instructions      Start Augmentin twice daily for 7 days.  Your x-ray was normal.  Your flu test was negative.  I will contact you if your blood work is abnormal or if you test positive for COVID.  Please follow-up closely with your primary care.  As we discussed, you need to monitor your temperature regularly.  If you develop a fever at any point you must go to the emergency room.  If anything worsens or changes you must go to the emergency room.     ED Prescriptions     Medication Sig Dispense Auth. Provider   amoxicillin-clavulanate (AUGMENTIN) 875-125 MG tablet Take 1 tablet by mouth every 12 (twelve) hours. 14 tablet Riki Berninger, Noberto Retort, PA-C      PDMP not reviewed this encounter.   Jeani Hawking, PA-C 12/07/22 1840

## 2022-12-07 NOTE — Discharge Instructions (Signed)
Start Augmentin twice daily for 7 days.  Your x-ray was normal.  Your flu test was negative.  I will contact you if your blood work is abnormal or if you test positive for COVID.  Please follow-up closely with your primary care.  As we discussed, you need to monitor your temperature regularly.  If you develop a fever at any point you must go to the emergency room.  If anything worsens or changes you must go to the emergency room.

## 2022-12-07 NOTE — ED Triage Notes (Signed)
Pt c/o cough and congestion that started a couple days. Reports that took Mucinex earlier today.  Her granddaughter came to house few days ago that had URI.   Pt reports supposed to be getting (chemo) treatment next week.

## 2022-12-08 LAB — SARS CORONAVIRUS 2 (TAT 6-24 HRS): SARS Coronavirus 2: NEGATIVE

## 2022-12-08 NOTE — Progress Notes (Signed)
[facility] OFFICE PROGRESS NOTE  [name], MD [address] [place] [address]  DIAGNOSIS: Metastatic urothelial carcinoma that was initially diagnosed as stage II (T2, N0, M0) in September 2022 status post right nephroureterectomy as well as intravesical treatment and resection of superficial tumor in the bladder several times and March 2023 as well as July 2023.  The patient was found to have enlarging and new pulmonary nodules consistent with metastatic disease in November 2023.    PRIOR THERAPY: 1) Right nephroureterectomy as well as intravesical treatment and resection of superficial tumor in the bladder several times and March 2023 as well as July 2023.  2) Palliative systemic chemotherapy with carboplatin for AUC of 5 on day 1 and gemcitabine 1000 mg/M2 on days 1 and 8 every 3 weeks. First cycle June 21, 2022. Status post 6 cycles. Her dose of carboplatin was reduced to AUC of 4 and gemcitabine to 800 mg/m2 starting from cycle #5 due to cytopenias.  CURRENT THERAPY:  Maintenance immunotherapy with Avelumab 800 mg IV every 2 weeks. First dose on 11/15/22. Status post 2 cycle of treatment.   INTERVAL HISTORY: Morgan Torres 72 y.o. female returns to the clinic today for a follow-up visit.  The patient was last seen by myself on 11/29/2022. She recently started immunotherapy and has been tolerating it well. In the interval since last being seen, she called c/o itching. She denies new lotions, soaps, or detergents. The itching lasted about a day or so and resolved. She denies rash. She took benadryl with some improvement in her symptoms.   She then presented to an urgent care on 12/07/22. She had contracted a URI from her granddaughter.  Her symptoms included malaise, nasal congestion, and coughing.  Her cough produces clear/white phlegm.  She had a chest x-ray which did not show any acute infection.  She was swabbed for COVID and the flu which were negative.  She was  treated with Augmentin.  She still continues to feel rundown today although her symptoms are improving compared to when she was seen on 12/07/2022 per patient report.  She does feel well enough to proceed with immunotherapy today. She still has a intermittent cough and some congestion. She denies any associated fevers, chest pain, nausea, or vomiting.  She has some baseline dyspnea on exertion secondary to COPD.  She does have inhalers at home and nebulizers.  She is needed to use her albuterol once since last being seen. She has not needed to use it today.  She has been using over-the-counter products such as Mucinex and medication.  She knows to avoid decongestants due to her hypertension.   The patient follows with orthopedics for her arthritis in her shoulder and low back.  She has had 3 prior back surgeries and hip surgery.  In the interval since last being seen, she did receive a steroid injection in her right shoulder which helped somewhat but benefit is not lasting as long as it was previously.  She has Voltaren cream if needed but she has not needed to take this since having her steroid injection in her shoulder.  She used a heating pad last night which has helped.  At her last appointment, she mentioned that her orthopedic provider recommended referral to a pain clinic but the patient was not interested in that as pain medication can cause/exacerbate constipation.   The patient had some significant anemia when she was on chemotherapy in the past.  Now  that she is on immunotherapy, her anemia is improving.  The patient has stable night sweats which has been occurring for majority of her life.  She denies any chest pain or hemoptysis.  She had developed some diarrhea on Sunday and Monday secondary to her antibiotic use with associated abdominal cramping which has improved at this time. She denies any hematuria, malodorous urine or cloudy urine.  She is here today for evaluation repeat blood work before  undergoing cycle #3.    MEDICAL HISTORY: Past Medical History:  Diagnosis Date   Anxiety    Arthritis    hip, lumbar spine    Asthma    seasonal allergies    Bronchitis    Hx: of   Chronic kidney disease    COPD (chronic obstructive pulmonary disease) (HCC)    Coronary artery disease    Depression    Fibromyalgia    Hypertension    Lumbar herniated disc    Myocardial infarction (HCC) 06/25/2012   followed by Dr. Rennis Golden, treated medically, no stents   Peripheral arterial disease (HCC)    of right foot   Pneumonia    Hx: of yrs ago   Sciatica    Sleep apnea    Small bowel obstruction (HCC)    several yrs ago    ALLERGIES:  is allergic to diclofenac, codeine, hydrocodone, and ciprofloxacin.  MEDICATIONS:  Current Outpatient Medications  Medication Sig Dispense Refill   acetaminophen (TYLENOL) 500 MG tablet Take 1 tablet (500 mg total) by mouth every 6 (six) hours as needed for mild pain. 30 tablet 0   albuterol (PROVENTIL HFA;VENTOLIN HFA) 108 (90 Base) MCG/ACT inhaler Inhale 2 puffs into the lungs every 6 (six) hours as needed for wheezing or shortness of breath. 1 Inhaler 0   amLODipine (NORVASC) 5 MG tablet TAKE 1 TABLET BY MOUTH EVERY DAY 15 tablet 0   amoxicillin-clavulanate (AUGMENTIN) 875-125 MG tablet Take 1 tablet by mouth every 12 (twelve) hours. 14 tablet 0   aspirin EC 81 MG tablet Take 81 mg by mouth daily. Swallow whole.     atorvastatin (LIPITOR) 20 MG tablet Take 20 mg by mouth. Monday wed and Friday in am     cilostazol (PLETAL) 100 MG tablet Take 100 mg by mouth 2 (two) times daily.     EPINEPHrine (EPIPEN 2-PAK) 0.3 mg/0.3 mL DEVI Inject 0.3 mLs (0.3 mg total) into the muscle once as needed (for severe allergic reaction). CAll 911 immediately if you have to use this medicine 1 Device 1   HYDROcodone-acetaminophen (NORCO/VICODIN) 5-325 MG tablet Take 1 tablet by mouth 3 (three) times daily as needed.     lidocaine-prilocaine (EMLA) cream Apply to the  Port-A-Cath site 30-60-minute before treatment 30 g 2   lidocaine-prilocaine (EMLA) cream Apply to affected area once 30 g 3   LORazepam (ATIVAN) 0.5 MG tablet Take 0.5 mg by mouth daily as needed for anxiety.     losartan (COZAAR) 100 MG tablet Take 100 mg by mouth daily.     losartan-hydrochlorothiazide (HYZAAR) 100-12.5 MG tablet Take 1 tablet by mouth daily.     metoprolol tartrate (LOPRESSOR) 25 MG tablet TAKE 1 TABLET BY MOUTH 2 TIMES DAILY. CALL AND SCHEDULE FOLLOW UP APPT TO RECEIVE FURTHER REFILLS. 15 tablet 0   nicotine (NICODERM CQ - DOSED IN MG/24 HOURS) 21 mg/24hr patch Place 21 mg onto the skin daily.     nicotine polacrilex (NICORETTE) 4 MG gum Take 4 mg by mouth as needed for  smoking cessation.     nitroGLYCERIN (NITROSTAT) 0.4 MG SL tablet Place 1 tablet (0.4 mg total) under the tongue every 5 (five) minutes x 3 doses as needed for chest pain. 25 tablet 2   ondansetron (ZOFRAN) 8 MG tablet Take 1 tablet (8 mg total) by mouth every 8 (eight) hours as needed for nausea or vomiting. 30 tablet 1   oxybutynin (DITROPAN) 5 MG tablet Take 1 tablet (5 mg total) by mouth every 8 (eight) hours as needed for bladder spasms. 30 tablet 1   polyethylene glycol (MIRALAX / GLYCOLAX) 17 g packet Take 17 g by mouth as needed.     prochlorperazine (COMPAZINE) 10 MG tablet Take 1 tablet (10 mg total) by mouth every 6 (six) hours as needed for nausea or vomiting. 30 tablet 0   prochlorperazine (COMPAZINE) 10 MG tablet Take 1 tablet (10 mg total) by mouth every 6 (six) hours as needed for nausea or vomiting. 30 tablet 1   traMADol (ULTRAM) 50 MG tablet Take by mouth every 6 (six) hours as needed. Not taking     No current facility-administered medications for this visit.    SURGICAL HISTORY:  Past Surgical History:  Procedure Laterality Date   ABDOMINAL SURGERY     resection of small intestine   ANTERIOR CRUCIATE LIGAMENT REPAIR Bilateral    BACK SURGERY     fusion of lumbar   CARDIAC  CATHETERIZATION  06/25/2012   COLON SURGERY     resection for bowel - for obstruction  6 to 7 yrs ago per pt on 01-05-2022   COLONOSCOPY     Hx; of   DILATION AND CURETTAGE OF UTERUS     EYE SURGERY Bilateral    cataracts removed   HERNIA REPAIR  02/23/1969   inguinal hernia    IR IMAGING GUIDED PORT INSERTION  06/07/2022   LEFT HEART CATHETERIZATION WITH CORONARY ANGIOGRAM N/A 03/30/2013   Procedure: LEFT HEART CATHETERIZATION WITH CORONARY ANGIOGRAM;  Surgeon: Marykay Lex, MD;  Location: [facility] CATH LAB;  Service: Cardiovascular;  Laterality: N/A;   left thumb surgery     yrs ago   ROBOT ASSITED LAPAROSCOPIC NEPHROURETERECTOMY Left 03/09/2021   Procedure: XI ROBOT ASSITED LAPAROSCOPIC NEPHROURETERECTOMY/ CYSTOSCOPY WITH LEFT URETEROSCOPY WITH TRANSURETHRAL RESECTION OF URETERAL ORIFICE;  Surgeon: Rene Paci, MD;  Location: [facility] ORS;  Service: Urology;  Laterality: Left;   SALIVARY GLAND SURGERY Left    approached from inside mouth & side of neck 1970-1980   TONSILLECTOMY     as child   TOTAL HIP ARTHROPLASTY Left 05/28/2014   Procedure: LEFT TOTAL HIP ARTHROPLASTY;  Surgeon: Thera Flake., MD;  Location: [facility] OR;  Service: Orthopedics;  Laterality: Left;   TRANSURETHRAL RESECTION OF BLADDER TUMOR N/A 08/23/2021   Procedure: TRANSURETHRAL RESECTION OF BLADDER TUMOR (TURBT) WITH CYSTOSCOPY/ POSTOPERATIVE INSTILLATION OF GEMCITABINE/retrograde pyelogram and stent placement (right);  Surgeon: Rene Paci, MD;  Location: [facility];  Service: Urology;  Laterality: N/A;   TRANSURETHRAL RESECTION OF BLADDER TUMOR N/A 01/10/2022   Procedure: TRANSURETHRAL RESECTION OF BLADDER TUMOR (TURBT) WITH CYSTOSCOPY / GEMCITABINE INSTILLATION post-operatively;  Surgeon: Rene Paci, MD;  Location: [facility];  Service: Urology;  Laterality: N/A;  ONLY NEEDS 30 MIN    REVIEW OF SYSTEMS:   Review of Systems  Constitutional: Positive  for fatigue.  Negative for appetite change, chills, fever and unexpected weight change.  HENT: Negative for mouth sores, nosebleeds, sore throat and trouble swallowing.  Eyes: Negative for eye problems and icterus.  Respiratory: Positive for cough. Negative for hemoptysis, shortness of breath and wheezing.   Cardiovascular: Negative for chest pain and leg swelling.  Gastrointestinal: Positive for diarrhea earlier this week (resolved at this time) negative for abdominal pain, constipation,  nausea and vomiting.  Genitourinary: Negative for bladder incontinence, difficulty urinating, dysuria, frequency and hematuria.   Musculoskeletal: Positive for chronic back pain and right shoulder pain. Negative for gait problem, neck pain and neck stiffness.  Skin: Negative for itching and rash.  Neurological: Negative for dizziness, extremity weakness, gait problem, headaches, light-headedness and seizures.  Hematological: Negative for adenopathy. Does not bruise/bleed easily.  Psychiatric/Behavioral: Negative for confusion, depression and sleep disturbance. The patient is not nervous/anxious.     PHYSICAL EXAMINATION:  Blood pressure (!) 141/59, pulse 76, temperature (!) 97.5 F (36.4 C), temperature source Oral, resp. rate 14, weight 171 lb 8 oz (77.8 kg), SpO2 99 %.  ECOG PERFORMANCE STATUS: 1  Physical Exam  Constitutional: Oriented to person, place, and time and well-developed, well-nourished, and in no distress.  HENT:  Head: Normocephalic and atraumatic.  Mouth/Throat: Oropharynx is clear and moist. No oropharyngeal exudate.  Eyes: Conjunctivae are normal. Right eye exhibits no discharge. Left eye exhibits no discharge. No scleral icterus.  Neck: Normal range of motion. Neck supple.  Cardiovascular: Normal rate, regular rhythm, normal heart sounds and intact distal pulses.   Pulmonary/Chest: Effort normal.  Quiet breath sounds bilaterally. No respiratory distress. No wheezes. No rales.   Abdominal: Soft. Bowel sounds are normal. Exhibits no distension and no mass. There is no tenderness.  Musculoskeletal: Normal range of motion. Exhibits no edema.  Lymphadenopathy:    No cervical adenopathy.  Neurological: Alert and oriented to person, place, and time. Exhibits normal muscle tone. Gait normal. Coordination normal.  Skin: Skin is warm and dry. No rash noted. Not diaphoretic. No erythema. No pallor.  Psychiatric: Mood, memory and judgment normal.  Vitals reviewed.  LABORATORY DATA: Lab Results  Component Value Date   WBC 13.1 (H) 12/12/2022   HGB 12.4 12/12/2022   HCT 37.1 12/12/2022   MCV 103.3 (H) 12/12/2022   PLT 192 12/12/2022      Chemistry      Component Value Date/Time   NA 134 (L) 12/12/2022 1035   K 4.5 12/12/2022 1035   CL 100 12/12/2022 1035   CO2 26 12/12/2022 1035   BUN 22 12/12/2022 1035   CREATININE 1.37 (H) 12/12/2022 1035      Component Value Date/Time   CALCIUM 9.3 12/12/2022 1035   ALKPHOS 87 12/12/2022 1035   AST 15 12/12/2022 1035   ALT 18 12/12/2022 1035   BILITOT 0.4 12/12/2022 1035       RADIOGRAPHIC STUDIES:  DG Chest 2 View  Result Date: 12/07/2022 CLINICAL DATA:  Cough EXAM: CHEST - 2 VIEW COMPARISON:  Previous studies including CT chest done on 11/03/2022 and chest radiographs done on 03/27/2015 FINDINGS: Cardiac size is within normal limits. There are no signs of pulmonary edema or new focal infiltrates. There is no pleural effusion or pneumothorax. There is 1.7 cm nodular density in left parahilar region corresponding to the lung nodule seen in the superior segment of left lower lobe. There is a faint 6 mm nodular density in the lateral aspect of left upper lung field. Tip of right IJ chest port is seen in superior vena cava. IMPRESSION: There are no signs of pulmonary edema or focal pulmonary consolidation. There is no pleural effusion  or pneumothorax. There are 2 faint nodular densities in left upper lung field consistent  with the metastatic lesions seen in previous CT. Electronically Signed   By: Ernie Avena M.D.   On: 12/07/2022 17:58     ASSESSMENT/PLAN:  This is a very pleasant 72 year old African-American female with metastatic urothelial carcinoma.  She was initially diagnosed as a stage II (T2, N0, M0) in September 2022.   She is status post right nephroureterectomy as well as  intravesical treatment and resection of superficial tumor in the bladder several times and March 2023 as well as July 2023.  The patient was found to have enlarging and new pulmonary nodules consistent with metastatic disease in November 2023.   Dr. Arbutus Ped sent her tissue for foundation 1 and PD-L1 expression. She does not have any actionable mutations.    She completed 6 cycles of systemic chemotherapy with carboplatin for an AUC of 5 on day 1 and gemcitabine 1000 milligrams per meter squared on days 1 and 8 IV every 3 weeks with Neulasta support.  Her dose was reduced to carboplatin for an AUC of 4 and gemcitabine 800 mg/m due to cytopenias transfusion support.   Dr. Arbutus Ped recommended maintenance immunotherapy with Avelumab 2 mg IV every 2 weeks.  She is status post 2 cycles of treatment. She had been tolerating it well.   Today, the patient is recovering from an upper respiratory infection that she had last week. She feels well enough to proceed with treatment today and denies any fevers or changes with her baseline dyspnea on exertion from her COPD.  COVID and flu swab negative.  Chest x-ray did not show any acute process.  She still has some residual nasal congestion and intermittent cough and is going to pick up cough medication upon returning home today to use if needed.    Recommend that she proceed with cycle #3 today as schedule.   We will see her back for follow-up visit in 2 weeks for evaluation repeat blood work before starting cycle #4.  The patient had some diarrhea a few days ago which could be secondary  to her antibiotic use.  Her diarrhea has improved at this time but encouraged her to use probiotic if needed.  Regarding her URI, the patient reports her granddaughter had the same symptoms.  Overall her symptoms are improving.  We discussed supportive care such as hydration, Tylenol, over-the-counter occasions, and Delsym/robitussin if needed for cough.  She did not cough during the encounter today.  Her chest x-ray was negative for any acute process.  She will complete her course of Augmentin, although we discussed this could be viral in nature.  Anticipate that her symptoms will improve with time but I did review new or worsening symptoms that would warrant re-evaluation.  For example, the patient understands should she ever develop any fevers, worsening cough, shortness of breath, malaise, nausea or vomiting, etc. that we would like her to be reevaluated and to call back.   The patient had some itching for 1 to 2 days in the interval since last being seen.  She denies any new lotions, soaps, or detergents.  This could be secondary to her immunotherapy.  If this occurs again, discussed she can take antihistamines.  She understands that Benadryl would make her feel drowsy so not to take this if she is planning on driving.  If she develops any significant itching or rash in the interval without improvement with OTC antihistamines, she is advised to call  the clinic for other recommendations including trialing Atarax and topical steroids for localized itching if needed.  She will continue to follow with palliative care and ortho for her chronic back pain and shoulder pain.   The patient was advised to call immediately if she has any concerning symptoms in the interval. The patient voices understanding of current disease status and treatment options and is in agreement with the current care plan. All questions were answered. The patient knows to call the clinic with any problems, questions or concerns. We  can certainly see the patient much sooner if necessary             No orders of the defined types were placed in this encounter.   The total time spent in the appointment was 20-29 minutes.   Romon Devereux L Corretta Munce, PA-C 12/12/22

## 2022-12-08 NOTE — Progress Notes (Deleted)
Pharmacist Chemotherapy Monitoring - Initial Assessment    Anticipated start date: 12/12/22   The following has been reviewed per standard work regarding the patient's treatment regimen: The patient's diagnosis, treatment plan and drug doses, and organ/hematologic function Lab orders and baseline tests specific to treatment regimen  The treatment plan start date, drug sequencing, and pre-medications Prior authorization status  Patient's documented medication list, including drug-drug interaction screen and prescriptions for anti-emetics and supportive care specific to the treatment regimen The drug concentrations, fluid compatibility, administration routes, and timing of the medications to be used The patient's access for treatment and lifetime cumulative dose history, if applicable  The patient's medication allergies and previous infusion related reactions, if applicable   Changes made to treatment plan:  N/A  Follow up needed:  N/A   Richardean Sale, RPH,  BCPS, BCOP 12/08/2022  11:22 AM

## 2022-12-11 ENCOUNTER — Other Ambulatory Visit: Payer: Self-pay

## 2022-12-12 ENCOUNTER — Other Ambulatory Visit: Payer: Self-pay

## 2022-12-12 ENCOUNTER — Inpatient Hospital Stay: Payer: Medicare Other | Admitting: Physician Assistant

## 2022-12-12 ENCOUNTER — Inpatient Hospital Stay: Payer: Medicare Other | Admitting: Dietician

## 2022-12-12 ENCOUNTER — Inpatient Hospital Stay: Payer: Medicare Other

## 2022-12-12 VITALS — BP 141/59 | HR 76 | Temp 97.5°F | Resp 14 | Wt 171.5 lb

## 2022-12-12 VITALS — BP 105/39 | HR 81 | Resp 15

## 2022-12-12 DIAGNOSIS — D649 Anemia, unspecified: Secondary | ICD-10-CM

## 2022-12-12 DIAGNOSIS — Z515 Encounter for palliative care: Secondary | ICD-10-CM | POA: Diagnosis not present

## 2022-12-12 DIAGNOSIS — Z9089 Acquired absence of other organs: Secondary | ICD-10-CM | POA: Diagnosis not present

## 2022-12-12 DIAGNOSIS — C7800 Secondary malignant neoplasm of unspecified lung: Secondary | ICD-10-CM

## 2022-12-12 DIAGNOSIS — J4489 Other specified chronic obstructive pulmonary disease: Secondary | ICD-10-CM | POA: Diagnosis not present

## 2022-12-12 DIAGNOSIS — C7910 Secondary malignant neoplasm of unspecified urinary organs: Secondary | ICD-10-CM

## 2022-12-12 DIAGNOSIS — R61 Generalized hyperhidrosis: Secondary | ICD-10-CM | POA: Diagnosis not present

## 2022-12-12 DIAGNOSIS — I700 Atherosclerosis of aorta: Secondary | ICD-10-CM | POA: Diagnosis not present

## 2022-12-12 DIAGNOSIS — M797 Fibromyalgia: Secondary | ICD-10-CM | POA: Diagnosis not present

## 2022-12-12 DIAGNOSIS — G8929 Other chronic pain: Secondary | ICD-10-CM | POA: Diagnosis not present

## 2022-12-12 DIAGNOSIS — Z5112 Encounter for antineoplastic immunotherapy: Secondary | ICD-10-CM

## 2022-12-12 DIAGNOSIS — K5900 Constipation, unspecified: Secondary | ICD-10-CM | POA: Diagnosis not present

## 2022-12-12 DIAGNOSIS — Z7902 Long term (current) use of antithrombotics/antiplatelets: Secondary | ICD-10-CM | POA: Diagnosis not present

## 2022-12-12 DIAGNOSIS — Z885 Allergy status to narcotic agent status: Secondary | ICD-10-CM | POA: Diagnosis not present

## 2022-12-12 DIAGNOSIS — I252 Old myocardial infarction: Secondary | ICD-10-CM | POA: Diagnosis not present

## 2022-12-12 DIAGNOSIS — Z792 Long term (current) use of antibiotics: Secondary | ICD-10-CM | POA: Diagnosis not present

## 2022-12-12 DIAGNOSIS — C679 Malignant neoplasm of bladder, unspecified: Secondary | ICD-10-CM | POA: Diagnosis not present

## 2022-12-12 DIAGNOSIS — J432 Centrilobular emphysema: Secondary | ICD-10-CM | POA: Diagnosis not present

## 2022-12-12 DIAGNOSIS — J069 Acute upper respiratory infection, unspecified: Secondary | ICD-10-CM | POA: Diagnosis not present

## 2022-12-12 DIAGNOSIS — Z881 Allergy status to other antibiotic agents status: Secondary | ICD-10-CM | POA: Diagnosis not present

## 2022-12-12 DIAGNOSIS — Z95828 Presence of other vascular implants and grafts: Secondary | ICD-10-CM

## 2022-12-12 DIAGNOSIS — M19011 Primary osteoarthritis, right shoulder: Secondary | ICD-10-CM | POA: Diagnosis not present

## 2022-12-12 DIAGNOSIS — Z79899 Other long term (current) drug therapy: Secondary | ICD-10-CM | POA: Diagnosis not present

## 2022-12-12 LAB — CBC WITH DIFFERENTIAL (CANCER CENTER ONLY)
Abs Immature Granulocytes: 0.06 10*3/uL (ref 0.00–0.07)
Basophils Absolute: 0 10*3/uL (ref 0.0–0.1)
Basophils Relative: 0 %
Eosinophils Absolute: 0.2 10*3/uL (ref 0.0–0.5)
Eosinophils Relative: 1 %
HCT: 37.1 % (ref 36.0–46.0)
Hemoglobin: 12.4 g/dL (ref 12.0–15.0)
Immature Granulocytes: 1 %
Lymphocytes Relative: 18 %
Lymphs Abs: 2.3 10*3/uL (ref 0.7–4.0)
MCH: 34.5 pg — ABNORMAL HIGH (ref 26.0–34.0)
MCHC: 33.4 g/dL (ref 30.0–36.0)
MCV: 103.3 fL — ABNORMAL HIGH (ref 80.0–100.0)
Monocytes Absolute: 1.5 10*3/uL — ABNORMAL HIGH (ref 0.1–1.0)
Monocytes Relative: 11 %
Neutro Abs: 9.1 10*3/uL — ABNORMAL HIGH (ref 1.7–7.7)
Neutrophils Relative %: 69 %
Platelet Count: 192 10*3/uL (ref 150–400)
RBC: 3.59 MIL/uL — ABNORMAL LOW (ref 3.87–5.11)
RDW: 14.6 % (ref 11.5–15.5)
WBC Count: 13.1 10*3/uL — ABNORMAL HIGH (ref 4.0–10.5)
nRBC: 0 % (ref 0.0–0.2)

## 2022-12-12 LAB — CMP (CANCER CENTER ONLY)
ALT: 18 U/L (ref 0–44)
AST: 15 U/L (ref 15–41)
Albumin: 3.5 g/dL (ref 3.5–5.0)
Alkaline Phosphatase: 87 U/L (ref 38–126)
Anion gap: 8 (ref 5–15)
BUN: 22 mg/dL (ref 8–23)
CO2: 26 mmol/L (ref 22–32)
Calcium: 9.3 mg/dL (ref 8.9–10.3)
Chloride: 100 mmol/L (ref 98–111)
Creatinine: 1.37 mg/dL — ABNORMAL HIGH (ref 0.44–1.00)
GFR, Estimated: 41 mL/min — ABNORMAL LOW (ref >60)
Glucose, Bld: 138 mg/dL — ABNORMAL HIGH (ref 70–99)
Potassium: 4.5 mmol/L (ref 3.5–5.1)
Sodium: 134 mmol/L — ABNORMAL LOW (ref 135–145)
Total Bilirubin: 0.4 mg/dL (ref 0.3–1.2)
Total Protein: 6.4 g/dL — ABNORMAL LOW (ref 6.5–8.1)

## 2022-12-12 LAB — SAMPLE TO BLOOD BANK

## 2022-12-12 LAB — TSH: TSH: 0.039 u[IU]/mL — ABNORMAL LOW (ref 0.350–4.500)

## 2022-12-12 MED ORDER — SODIUM CHLORIDE 0.9% FLUSH
10.0000 mL | Freq: Once | INTRAVENOUS | Status: AC
  Administered 2022-12-12: 10 mL

## 2022-12-12 MED ORDER — SODIUM CHLORIDE 0.9% FLUSH
10.0000 mL | INTRAVENOUS | Status: DC | PRN
  Administered 2022-12-12: 10 mL

## 2022-12-12 MED ORDER — ACETAMINOPHEN 325 MG PO TABS
650.0000 mg | ORAL_TABLET | Freq: Once | ORAL | Status: AC
  Administered 2022-12-12: 650 mg via ORAL
  Filled 2022-12-12: qty 2

## 2022-12-12 MED ORDER — HEPARIN SOD (PORK) LOCK FLUSH 100 UNIT/ML IV SOLN
500.0000 [IU] | Freq: Once | INTRAVENOUS | Status: AC | PRN
  Administered 2022-12-12: 500 [IU]

## 2022-12-12 MED ORDER — SODIUM CHLORIDE 0.9 % IV SOLN
800.0000 mg | Freq: Once | INTRAVENOUS | Status: AC
  Administered 2022-12-12: 800 mg via INTRAVENOUS
  Filled 2022-12-12: qty 40

## 2022-12-12 MED ORDER — DIPHENHYDRAMINE HCL 50 MG/ML IJ SOLN
25.0000 mg | Freq: Once | INTRAMUSCULAR | Status: AC
  Administered 2022-12-12: 25 mg via INTRAVENOUS
  Filled 2022-12-12: qty 1

## 2022-12-12 MED ORDER — SODIUM CHLORIDE 0.9 % IV SOLN: Freq: Once | INTRAVENOUS | Status: AC

## 2022-12-12 NOTE — Progress Notes (Signed)
Nutrition Follow-up:  Patient with metastatic urothelial carcinoma. Patient is currently receiving maintenance immunotherapy with Avelumab q14d (first 5/23)  Noted 6/14 - urgent care visit with COPD exacerbation   Met briefly with patient in infusion. Patient is tearful at visit. Says she is not feeling well. Patient has been having diarrhea over the last week. Reports 2 episodes daily. She relates this to recent antibiotics taken for URI. Patient reports this has improved. She has a poor appetite. Patient was previously trying to drink 2 Ensure. These are expensive and has been out of them the last few days. Patient says she has not been well enough to go to the store.   Medications: reviewed   Labs: Na 134, Cr 1.37  Anthropometrics: Wt 171 lb 8 oz today decreased ~6% (10 lbs) in 2 weeks - this is severe for time frame   NUTRITION DIAGNOSIS: Unintended weight loss continues     INTERVENTION:  Discussed strategies for diarrhea Encouraged small frequent meals/snacks + having ready prepared/heat and serve meals on hand  Encouraged soft moist textures for ease of intake given increased work of breathing with URI/COPD Suggested pt increase Ensure to 3/day as tolerated  Patient provided with one complimentary case of ensure due to one or more of the following reasons:  malnutrition, weight loss/maintenance, poor appetite AND food insecurity or financial hardship   MONITORING, EVALUATION, GOAL: weight trends, intake   NEXT VISIT: Thursday July 18 during infusion with Britta Mccreedy

## 2022-12-14 LAB — T4: T4, Total: 15.6 ug/dL — ABNORMAL HIGH (ref 4.5–12.0)

## 2022-12-18 DIAGNOSIS — C7910 Secondary malignant neoplasm of unspecified urinary organs: Secondary | ICD-10-CM | POA: Diagnosis not present

## 2022-12-18 DIAGNOSIS — I10 Essential (primary) hypertension: Secondary | ICD-10-CM | POA: Diagnosis not present

## 2022-12-18 DIAGNOSIS — J449 Chronic obstructive pulmonary disease, unspecified: Secondary | ICD-10-CM | POA: Diagnosis not present

## 2022-12-18 DIAGNOSIS — G4733 Obstructive sleep apnea (adult) (pediatric): Secondary | ICD-10-CM | POA: Diagnosis not present

## 2022-12-18 DIAGNOSIS — C679 Malignant neoplasm of bladder, unspecified: Secondary | ICD-10-CM | POA: Diagnosis not present

## 2022-12-18 DIAGNOSIS — J209 Acute bronchitis, unspecified: Secondary | ICD-10-CM | POA: Diagnosis not present

## 2022-12-18 DIAGNOSIS — Z720 Tobacco use: Secondary | ICD-10-CM | POA: Diagnosis not present

## 2022-12-20 ENCOUNTER — Other Ambulatory Visit: Payer: Self-pay

## 2022-12-20 ENCOUNTER — Other Ambulatory Visit: Payer: Self-pay | Admitting: Medical Oncology

## 2022-12-20 ENCOUNTER — Telehealth: Payer: Self-pay | Admitting: Medical Oncology

## 2022-12-20 ENCOUNTER — Inpatient Hospital Stay: Payer: Medicare Other

## 2022-12-20 DIAGNOSIS — Z9089 Acquired absence of other organs: Secondary | ICD-10-CM | POA: Diagnosis not present

## 2022-12-20 DIAGNOSIS — I252 Old myocardial infarction: Secondary | ICD-10-CM | POA: Diagnosis not present

## 2022-12-20 DIAGNOSIS — C679 Malignant neoplasm of bladder, unspecified: Secondary | ICD-10-CM | POA: Diagnosis not present

## 2022-12-20 DIAGNOSIS — Z5112 Encounter for antineoplastic immunotherapy: Secondary | ICD-10-CM | POA: Diagnosis not present

## 2022-12-20 DIAGNOSIS — K5900 Constipation, unspecified: Secondary | ICD-10-CM | POA: Diagnosis not present

## 2022-12-20 DIAGNOSIS — E860 Dehydration: Secondary | ICD-10-CM

## 2022-12-20 DIAGNOSIS — R61 Generalized hyperhidrosis: Secondary | ICD-10-CM | POA: Diagnosis not present

## 2022-12-20 DIAGNOSIS — D649 Anemia, unspecified: Secondary | ICD-10-CM | POA: Diagnosis not present

## 2022-12-20 DIAGNOSIS — I700 Atherosclerosis of aorta: Secondary | ICD-10-CM | POA: Diagnosis not present

## 2022-12-20 DIAGNOSIS — M19011 Primary osteoarthritis, right shoulder: Secondary | ICD-10-CM | POA: Diagnosis not present

## 2022-12-20 DIAGNOSIS — J4489 Other specified chronic obstructive pulmonary disease: Secondary | ICD-10-CM | POA: Diagnosis not present

## 2022-12-20 DIAGNOSIS — R197 Diarrhea, unspecified: Secondary | ICD-10-CM

## 2022-12-20 DIAGNOSIS — Z515 Encounter for palliative care: Secondary | ICD-10-CM | POA: Diagnosis not present

## 2022-12-20 DIAGNOSIS — Z885 Allergy status to narcotic agent status: Secondary | ICD-10-CM | POA: Diagnosis not present

## 2022-12-20 DIAGNOSIS — Z79899 Other long term (current) drug therapy: Secondary | ICD-10-CM | POA: Diagnosis not present

## 2022-12-20 DIAGNOSIS — Z881 Allergy status to other antibiotic agents status: Secondary | ICD-10-CM | POA: Diagnosis not present

## 2022-12-20 DIAGNOSIS — J432 Centrilobular emphysema: Secondary | ICD-10-CM | POA: Diagnosis not present

## 2022-12-20 DIAGNOSIS — Z7902 Long term (current) use of antithrombotics/antiplatelets: Secondary | ICD-10-CM | POA: Diagnosis not present

## 2022-12-20 DIAGNOSIS — C7800 Secondary malignant neoplasm of unspecified lung: Secondary | ICD-10-CM | POA: Diagnosis not present

## 2022-12-20 DIAGNOSIS — M797 Fibromyalgia: Secondary | ICD-10-CM | POA: Diagnosis not present

## 2022-12-20 DIAGNOSIS — G8929 Other chronic pain: Secondary | ICD-10-CM | POA: Diagnosis not present

## 2022-12-20 DIAGNOSIS — Z792 Long term (current) use of antibiotics: Secondary | ICD-10-CM | POA: Diagnosis not present

## 2022-12-20 DIAGNOSIS — J069 Acute upper respiratory infection, unspecified: Secondary | ICD-10-CM | POA: Diagnosis not present

## 2022-12-20 MED ORDER — SODIUM CHLORIDE 0.9 % IV SOLN: INTRAVENOUS | Status: DC

## 2022-12-20 NOTE — Patient Instructions (Signed)

## 2022-12-20 NOTE — Telephone Encounter (Signed)
Diarrhea- started last wed and continues .  "I feel so drained and tired. I think I need IVF.   Completed 7 days of Amoxicillin last Friday for " respiratory infection" ( Urgent Care).  Per Dr. Arbutus Ped ,it is ok for IVF today. Schedule message sent.

## 2022-12-20 NOTE — Telephone Encounter (Signed)
Diarrhea -wants IVF

## 2022-12-26 ENCOUNTER — Encounter: Payer: Self-pay | Admitting: Adult Health

## 2022-12-26 ENCOUNTER — Inpatient Hospital Stay: Payer: Medicare Other | Attending: Internal Medicine

## 2022-12-26 ENCOUNTER — Other Ambulatory Visit: Payer: Medicare Other

## 2022-12-26 ENCOUNTER — Inpatient Hospital Stay (HOSPITAL_BASED_OUTPATIENT_CLINIC_OR_DEPARTMENT_OTHER): Payer: Medicare Other | Admitting: Adult Health

## 2022-12-26 ENCOUNTER — Inpatient Hospital Stay: Payer: Medicare Other

## 2022-12-26 ENCOUNTER — Other Ambulatory Visit: Payer: Self-pay

## 2022-12-26 VITALS — BP 105/44 | HR 72 | Resp 17

## 2022-12-26 VITALS — BP 123/56 | HR 80 | Temp 97.5°F | Resp 18 | Ht 68.0 in | Wt 170.0 lb

## 2022-12-26 DIAGNOSIS — C7800 Secondary malignant neoplasm of unspecified lung: Secondary | ICD-10-CM | POA: Diagnosis not present

## 2022-12-26 DIAGNOSIS — Z881 Allergy status to other antibiotic agents status: Secondary | ICD-10-CM | POA: Insufficient documentation

## 2022-12-26 DIAGNOSIS — R197 Diarrhea, unspecified: Secondary | ICD-10-CM

## 2022-12-26 DIAGNOSIS — Z5112 Encounter for antineoplastic immunotherapy: Secondary | ICD-10-CM | POA: Insufficient documentation

## 2022-12-26 DIAGNOSIS — C679 Malignant neoplasm of bladder, unspecified: Secondary | ICD-10-CM

## 2022-12-26 DIAGNOSIS — Z809 Family history of malignant neoplasm, unspecified: Secondary | ICD-10-CM | POA: Diagnosis not present

## 2022-12-26 DIAGNOSIS — Z803 Family history of malignant neoplasm of breast: Secondary | ICD-10-CM | POA: Insufficient documentation

## 2022-12-26 DIAGNOSIS — F17210 Nicotine dependence, cigarettes, uncomplicated: Secondary | ICD-10-CM | POA: Insufficient documentation

## 2022-12-26 DIAGNOSIS — C7910 Secondary malignant neoplasm of unspecified urinary organs: Secondary | ICD-10-CM

## 2022-12-26 DIAGNOSIS — K909 Intestinal malabsorption, unspecified: Secondary | ICD-10-CM

## 2022-12-26 DIAGNOSIS — Z9089 Acquired absence of other organs: Secondary | ICD-10-CM | POA: Diagnosis not present

## 2022-12-26 DIAGNOSIS — R059 Cough, unspecified: Secondary | ICD-10-CM | POA: Insufficient documentation

## 2022-12-26 DIAGNOSIS — I252 Old myocardial infarction: Secondary | ICD-10-CM | POA: Insufficient documentation

## 2022-12-26 DIAGNOSIS — E860 Dehydration: Secondary | ICD-10-CM | POA: Diagnosis not present

## 2022-12-26 DIAGNOSIS — Z79899 Other long term (current) drug therapy: Secondary | ICD-10-CM | POA: Diagnosis not present

## 2022-12-26 DIAGNOSIS — R630 Anorexia: Secondary | ICD-10-CM | POA: Insufficient documentation

## 2022-12-26 DIAGNOSIS — Z8249 Family history of ischemic heart disease and other diseases of the circulatory system: Secondary | ICD-10-CM | POA: Insufficient documentation

## 2022-12-26 DIAGNOSIS — Z833 Family history of diabetes mellitus: Secondary | ICD-10-CM | POA: Diagnosis not present

## 2022-12-26 DIAGNOSIS — Z885 Allergy status to narcotic agent status: Secondary | ICD-10-CM | POA: Insufficient documentation

## 2022-12-26 LAB — CMP (CANCER CENTER ONLY)
ALT: 20 U/L (ref 0–44)
AST: 15 U/L (ref 15–41)
Albumin: 3.4 g/dL — ABNORMAL LOW (ref 3.5–5.0)
Alkaline Phosphatase: 71 U/L (ref 38–126)
Anion gap: 5 (ref 5–15)
BUN: 29 mg/dL — ABNORMAL HIGH (ref 8–23)
CO2: 24 mmol/L (ref 22–32)
Calcium: 9.1 mg/dL (ref 8.9–10.3)
Chloride: 104 mmol/L (ref 98–111)
Creatinine: 1.33 mg/dL — ABNORMAL HIGH (ref 0.44–1.00)
GFR, Estimated: 42 mL/min — ABNORMAL LOW (ref >60)
Glucose, Bld: 100 mg/dL — ABNORMAL HIGH (ref 70–99)
Potassium: 4.5 mmol/L (ref 3.5–5.1)
Sodium: 133 mmol/L — ABNORMAL LOW (ref 135–145)
Total Bilirubin: 0.3 mg/dL (ref 0.3–1.2)
Total Protein: 6 g/dL — ABNORMAL LOW (ref 6.5–8.1)

## 2022-12-26 LAB — CBC WITH DIFFERENTIAL (CANCER CENTER ONLY)
Abs Immature Granulocytes: 0.03 10*3/uL (ref 0.00–0.07)
Basophils Absolute: 0 10*3/uL (ref 0.0–0.1)
Basophils Relative: 0 %
Eosinophils Absolute: 0.1 10*3/uL (ref 0.0–0.5)
Eosinophils Relative: 1 %
HCT: 35.4 % — ABNORMAL LOW (ref 36.0–46.0)
Hemoglobin: 11.9 g/dL — ABNORMAL LOW (ref 12.0–15.0)
Immature Granulocytes: 0 %
Lymphocytes Relative: 28 %
Lymphs Abs: 2.3 10*3/uL (ref 0.7–4.0)
MCH: 34.3 pg — ABNORMAL HIGH (ref 26.0–34.0)
MCHC: 33.6 g/dL (ref 30.0–36.0)
MCV: 102 fL — ABNORMAL HIGH (ref 80.0–100.0)
Monocytes Absolute: 0.6 10*3/uL (ref 0.1–1.0)
Monocytes Relative: 7 %
Neutro Abs: 5.2 10*3/uL (ref 1.7–7.7)
Neutrophils Relative %: 64 %
Platelet Count: 247 10*3/uL (ref 150–400)
RBC: 3.47 MIL/uL — ABNORMAL LOW (ref 3.87–5.11)
RDW: 13.5 % (ref 11.5–15.5)
WBC Count: 8.2 10*3/uL (ref 4.0–10.5)
nRBC: 0 % (ref 0.0–0.2)

## 2022-12-26 MED ORDER — SODIUM CHLORIDE 0.9% FLUSH
10.0000 mL | INTRAVENOUS | Status: DC | PRN
  Administered 2022-12-26: 10 mL

## 2022-12-26 MED ORDER — SODIUM CHLORIDE 0.9 % IV SOLN
800.0000 mg | Freq: Once | INTRAVENOUS | Status: AC
  Administered 2022-12-26: 800 mg via INTRAVENOUS
  Filled 2022-12-26: qty 40

## 2022-12-26 MED ORDER — SODIUM CHLORIDE 0.9 % IV SOLN: Freq: Once | INTRAVENOUS | Status: AC

## 2022-12-26 MED ORDER — HEPARIN SOD (PORK) LOCK FLUSH 100 UNIT/ML IV SOLN
500.0000 [IU] | Freq: Once | INTRAVENOUS | Status: AC | PRN
  Administered 2022-12-26: 500 [IU]

## 2022-12-26 MED ORDER — SODIUM CHLORIDE 0.9 % IV SOLN: INTRAVENOUS | Status: AC

## 2022-12-26 MED ORDER — DIPHENHYDRAMINE HCL 50 MG/ML IJ SOLN
25.0000 mg | Freq: Once | INTRAMUSCULAR | Status: AC
  Administered 2022-12-26: 25 mg via INTRAVENOUS
  Filled 2022-12-26: qty 1

## 2022-12-26 MED ORDER — ACETAMINOPHEN 325 MG PO TABS
650.0000 mg | ORAL_TABLET | Freq: Once | ORAL | Status: AC
  Administered 2022-12-26: 650 mg via ORAL
  Filled 2022-12-26: qty 2

## 2022-12-26 NOTE — Assessment & Plan Note (Signed)
Morgan Torres is a 72 year old woman with metastatic urothelial carcinoma currently on treatment with ovale Mab given every 14 days.  Metastatic urothelial carcinoma: She has no clinical signs of metastatic cancer progression.  Her labs today are reviewed and stable from prior.  She will proceed with treatment today with Avelumab.  She appears to be tolerating it well.   Recent URI: Mild residual cough that is slowly improving.  No other signs of persistent infection or concerns. Diarrhea and dehydration: This is related to the Augmentin that she was taking.  Her diarrhea is improving but we will give her a liter of normal saline today.  I ordered stool studies for her to take the containers home.  If her diarrhea becomes watery she will collect her stool and bring it back to [place] in clinic.  Morgan Torres will f/u with Dr. Arbutus Ped in 2 weeks for labs, evaluation, and her next treatment.

## 2022-12-26 NOTE — Progress Notes (Signed)
Patient BP 83/52 post treatment. Had patient wake up and sit up and drink fluids. Rechecked BP and 105/44, patient awake and alert. Spoke with Dr. Arbutus Ped and patient okay for discharge. Patient assisted to lobby in wheelchair by NT. Patient was sent home with stool sample kit from lab with written instructions. Reviewed instructions with patient and patient verbalized understanding.

## 2022-12-26 NOTE — Patient Instructions (Signed)
[facility]  Discharge Instructions: Thank you for choosing [facility] to provide your oncology and hematology care.   If you have a lab appointment with the Cancer Center, please go directly to the Cancer Center and check in at the registration area.   Wear comfortable clothing and clothing appropriate for easy access to any Portacath or PICC line.   We strive to give you quality time with your provider. You may need to reschedule your appointment if you arrive late (15 or more minutes).  Arriving late affects you and other patients whose appointments are after yours.  Also, if you miss three or more appointments without notifying the office, you may be dismissed from the clinic at the provider's discretion.      For prescription refill requests, have your pharmacy contact our office and allow 72 hours for refills to be completed.    Today you received the following chemotherapy and/or immunotherapy agents : Bavencio      To help prevent nausea and vomiting after your treatment, we encourage you to take your nausea medication as directed.  BELOW ARE SYMPTOMS THAT SHOULD BE REPORTED IMMEDIATELY: *FEVER GREATER THAN 100.4 F (38 C) OR HIGHER *CHILLS OR SWEATING *NAUSEA AND VOMITING THAT IS NOT CONTROLLED WITH YOUR NAUSEA MEDICATION *UNUSUAL SHORTNESS OF BREATH *UNUSUAL BRUISING OR BLEEDING *URINARY PROBLEMS (pain or burning when urinating, or frequent urination) *BOWEL PROBLEMS (unusual diarrhea, constipation, pain near the anus) TENDERNESS IN MOUTH AND THROAT WITH OR WITHOUT PRESENCE OF ULCERS (sore throat, sores in mouth, or a toothache) UNUSUAL RASH, SWELLING OR PAIN  UNUSUAL VAGINAL DISCHARGE OR ITCHING   Items with * indicate a potential emergency and should be followed up as soon as possible or go to the Emergency Department if any problems should occur.  Please show the CHEMOTHERAPY ALERT CARD or IMMUNOTHERAPY ALERT CARD at  check-in to the Emergency Department and triage nurse.  Should you have questions after your visit or need to cancel or reschedule your appointment, please contact [facility]  Dept: [phone]  and follow the prompts.  Office hours are 8:00 a.m. to 4:30 p.m. Monday - Friday. Please note that voicemails left after 4:00 p.m. may not be returned until the following business day.  We are closed weekends and major holidays. You have access to a nurse at all times for urgent questions. Please call the main number to the clinic Dept: [phone] and follow the prompts.   For any non-urgent questions, you may also contact your provider using MyChart. We now offer e-Visits for anyone 59 and older to request care online for non-urgent symptoms. For details visit [email or web address].   Also download the MyChart app! Go to the app store, search "MyChart", open the app, select [facility], and log in with your MyChart username and password.

## 2022-12-26 NOTE — Progress Notes (Signed)
[facility] Cancer Follow up:    Morgan Mires, MD [address] [place] [address]   DIAGNOSIS: Metastatic urothelial carcinoma that was initially diagnosed as stage II (T2, N0, M0) in September 2022 status post right nephroureterectomy as well as intravesical treatment and resection of superficial tumor in the bladder several times and March 2023 as well as July 2023.  The patient was found to have enlarging and new pulmonary nodules consistent with metastatic disease in November 2023.   SUMMARY OF ONCOLOGIC HISTORY: 1) Right nephroureterectomy as well as intravesical treatment and resection of superficial tumor in the bladder several times and March 2023 as well as July 2023.  2) Palliative systemic chemotherapy with carboplatin for AUC of 5 on day 1 and gemcitabine 1000 mg/M2 on days 1 and 8 every 3 weeks. First cycle June 21, 2022. Status post 6 cycles. Her dose of carboplatin was reduced to AUC of 4 and gemcitabine to 800 mg/m2 starting from cycle #5 due to cytopenias. 3. Avelumab given on day 1 of a 14 day cycle  beginning 11/14/2022  CURRENT THERAPY:metastatic urothelial cancer  INTERVAL HISTORY: Morgan Torres 72 y.o. female returns for follow-up and evaluation prior to receiving treatment with Avelumab.  She is doing moderately well.  She completed a course of antibiotics with Augmentin for an upper respiratory tract infection and notes that while receiving Augmentin she had watery diarrhea.  Her diarrhea is thickening up and is now semisolid and she has had none so far today.  She does feel dehydrated due to the diarrhea she experienced from the antibiotic and wonders if she could receive some IV fluids today.  He has a mild residual cough from her upper respiratory tract infection but otherwise is doing well and has no fever chills or other signs of infection today.  She is also being seen by Vernell Leep and nutrition for her weight loss.  She has ran out of the  Ensure and is requesting some more if available.  Her most recent CT chest abd pelvis from May 2024 demonstrates stable pulmonary metastases with no evidence of new metastatic disease.   Patient Active Problem List   Diagnosis Date Noted   Encounter for antineoplastic immunotherapy 11/29/2022   Goals of care, counseling/discussion 11/05/2022   Port-A-Cath in place 07/12/2022   Metastatic urothelial carcinoma (HCC) 05/30/2022   Bladder cancer metastasized to lung (HCC) 05/30/2022   Encounter for antineoplastic chemotherapy 05/30/2022   Renal mass 03/09/2021   Morbid obesity, unspecified obesity type (HCC) 08/28/2016   Non-restorative sleep 08/28/2016   Snoring 08/28/2016   Hip pain 06/03/2014   Acute blood loss anemia 06/01/2014   Osteoarthritis of left hip 05/28/2014   Obesity (BMI 30.0-34.9) 04/20/2013   Nonischemic cardiomyopathy (HCC) 04/01/2013   Smoking 04/01/2013   NSTEMI - Troponin pk 1.5 with ant TWI on EKG 03/29/2013   Spinal stenosis, lumbar region, with neurogenic claudication 01/23/2013   VAGINAL TRICHOMONIASIS 06/28/2006   Hyperlipidemia 06/28/2006   ANEMIA-NOS 06/28/2006   ANXIETY 06/28/2006   DEPRESSION 06/28/2006   SYNDROME, CARPAL TUNNEL 06/28/2006   Essential hypertension 06/28/2006   ALLERGIC RHINITIS 06/28/2006   ASTHMA 06/28/2006   PAROTIDITIS 06/28/2006   LOW BACK PAIN 06/28/2006   TB SKIN TEST, POSITIVE, HX OF 06/28/2006    is allergic to diclofenac, codeine, hydrocodone, and ciprofloxacin.  MEDICAL HISTORY: Past Medical History:  Diagnosis Date   Anxiety    Arthritis    hip, lumbar spine  Asthma    seasonal allergies    Bronchitis    Hx: of   Chronic kidney disease    COPD (chronic obstructive pulmonary disease) (HCC)    Coronary artery disease    Depression    Fibromyalgia    Hypertension    Lumbar herniated disc    Myocardial infarction (HCC) 06/25/2012   followed by Dr. Rennis Golden, treated medically, no stents   Peripheral arterial  disease (HCC)    of right foot   Pneumonia    Hx: of yrs ago   Sciatica    Sleep apnea    Small bowel obstruction (HCC)    several yrs ago    SURGICAL HISTORY: Past Surgical History:  Procedure Laterality Date   ABDOMINAL SURGERY     resection of small intestine   ANTERIOR CRUCIATE LIGAMENT REPAIR Bilateral    BACK SURGERY     fusion of lumbar   CARDIAC CATHETERIZATION  06/25/2012   COLON SURGERY     resection for bowel - for obstruction  6 to 7 yrs ago per pt on 01-05-2022   COLONOSCOPY     Hx; of   DILATION AND CURETTAGE OF UTERUS     EYE SURGERY Bilateral    cataracts removed   HERNIA REPAIR  02/23/1969   inguinal hernia    IR IMAGING GUIDED PORT INSERTION  06/07/2022   LEFT HEART CATHETERIZATION WITH CORONARY ANGIOGRAM N/A 03/30/2013   Procedure: LEFT HEART CATHETERIZATION WITH CORONARY ANGIOGRAM;  Surgeon: Marykay Lex, MD;  Location: [facility] CATH LAB;  Service: Cardiovascular;  Laterality: N/A;   left thumb surgery     yrs ago   ROBOT ASSITED LAPAROSCOPIC NEPHROURETERECTOMY Left 03/09/2021   Procedure: XI ROBOT ASSITED LAPAROSCOPIC NEPHROURETERECTOMY/ CYSTOSCOPY WITH LEFT URETEROSCOPY WITH TRANSURETHRAL RESECTION OF URETERAL ORIFICE;  Surgeon: Rene Paci, MD;  Location: [facility] ORS;  Service: Urology;  Laterality: Left;   SALIVARY GLAND SURGERY Left    approached from inside mouth & side of neck 1970-1980   TONSILLECTOMY     as child   TOTAL HIP ARTHROPLASTY Left 05/28/2014   Procedure: LEFT TOTAL HIP ARTHROPLASTY;  Surgeon: Thera Flake., MD;  Location: [facility] OR;  Service: Orthopedics;  Laterality: Left;   TRANSURETHRAL RESECTION OF BLADDER TUMOR N/A 08/23/2021   Procedure: TRANSURETHRAL RESECTION OF BLADDER TUMOR (TURBT) WITH CYSTOSCOPY/ POSTOPERATIVE INSTILLATION OF GEMCITABINE/retrograde pyelogram and stent placement (right);  Surgeon: Rene Paci, MD;  Location[facility];  Service: Urology;  Laterality: N/A;   TRANSURETHRAL  RESECTION OF BLADDER TUMOR N/A 01/10/2022   Procedure: TRANSURETHRAL RESECTION OF BLADDER TUMOR (TURBT) WITH CYSTOSCOPY / GEMCITABINE INSTILLATION post-operatively;  Surgeon: Rene Paci, MD;  Location: [facility];  Service: Urology;  Laterality: N/A;  ONLY NEEDS 30 MIN    SOCIAL HISTORY: Social History   Socioeconomic History   Marital status: Married    Spouse name: Not on file   Number of children: Not on file   Years of education: Not on file   Highest education level: Not on file  Occupational History   Not on file  Tobacco Use   Smoking status: Some Days    Packs/day: 0.50    Years: 25.00    Additional pack years: 0.00    Total pack years: 12.50    Types: Cigarettes   Smokeless tobacco: Never   Tobacco comments:    Currently on the Nicoderm patch."smokes a few"  Vaping Use   Vaping Use: Never used  Substance and Sexual Activity   Alcohol use: Yes    Comment: occasionally   Drug use: No   Sexual activity: Not Currently  Other Topics Concern   Not on file  Social History Narrative   Not on file   Social Determinants of Health   Financial Resource Strain: Not on file  Food Insecurity: Not on file  Transportation Needs: Not on file  Physical Activity: Not on file  Stress: Not on file  Social Connections: Not on file  Intimate Partner Violence: Not on file    FAMILY HISTORY: Family History  Problem Relation Age of Onset   Hypertension Mother    Diabetes Mother    Cancer - Other Mother    Cancer Sister    Breast cancer Daughter     Review of Systems  Constitutional:  Positive for fatigue. Negative for appetite change, chills, fever and unexpected weight change.  HENT:   Negative for hearing loss, lump/mass, mouth sores, sore throat and trouble swallowing.   Eyes:  Negative for eye problems and icterus.  Respiratory:  Positive for cough. Negative for chest tightness and shortness of breath.   Cardiovascular:  Negative for  chest pain, leg swelling and palpitations.  Gastrointestinal:  Positive for diarrhea. Negative for abdominal distention, abdominal pain, constipation, nausea and vomiting.  Endocrine: Negative for hot flashes.  Genitourinary:  Negative for difficulty urinating.   Musculoskeletal:  Negative for arthralgias.  Skin:  Negative for itching and rash.  Neurological:  Negative for dizziness, extremity weakness, headaches and numbness.  Hematological:  Negative for adenopathy. Does not bruise/bleed easily.  Psychiatric/Behavioral:  Negative for depression. The patient is not nervous/anxious.       PHYSICAL EXAMINATION    Vitals:   12/26/22 0922  BP: (!) 123/56  Pulse: 80  Resp: 18  Temp: (!) 97.5 F (36.4 C)  SpO2: 99%    Physical Exam Constitutional:      General: She is not in acute distress.    Appearance: Normal appearance. She is not toxic-appearing.  HENT:     Head: Normocephalic and atraumatic.     Mouth/Throat:     Mouth: Mucous membranes are dry.     Pharynx: Oropharynx is clear. No oropharyngeal exudate or posterior oropharyngeal erythema.  Eyes:     General: No scleral icterus. Cardiovascular:     Rate and Rhythm: Normal rate and regular rhythm.     Pulses: Normal pulses.     Heart sounds: Normal heart sounds.  Pulmonary:     Effort: Pulmonary effort is normal.     Breath sounds: Normal breath sounds.  Abdominal:     General: Abdomen is flat. Bowel sounds are normal. There is no distension.     Palpations: Abdomen is soft.     Tenderness: There is no abdominal tenderness.  Musculoskeletal:        General: No swelling.     Cervical back: Neck supple.  Lymphadenopathy:     Cervical: No cervical adenopathy.  Skin:    General: Skin is warm and dry.     Findings: No rash.  Neurological:     General: No focal deficit present.     Mental Status: She is alert.  Psychiatric:        Mood and Affect: Mood normal.        Behavior: Behavior normal.     LABORATORY  DATA:  CBC    Component Value Date/Time   WBC 8.2 12/26/2022 0907   WBC 12.8 (  H) 12/07/2022 1709   RBC 3.47 (L) 12/26/2022 0907   HGB 11.9 (L) 12/26/2022 0907   HCT 35.4 (L) 12/26/2022 0907   PLT 247 12/26/2022 0907   MCV 102.0 (H) 12/26/2022 0907   MCH 34.3 (H) 12/26/2022 0907   MCHC 33.6 12/26/2022 0907   RDW 13.5 12/26/2022 0907   LYMPHSABS 2.3 12/26/2022 0907   MONOABS 0.6 12/26/2022 0907   EOSABS 0.1 12/26/2022 0907   BASOSABS 0.0 12/26/2022 0907    CMP     Component Value Date/Time   NA 133 (L) 12/26/2022 0907   K 4.5 12/26/2022 0907   CL 104 12/26/2022 0907   CO2 24 12/26/2022 0907   GLUCOSE 100 (H) 12/26/2022 0907   BUN 29 (H) 12/26/2022 0907   CREATININE 1.33 (H) 12/26/2022 0907   CALCIUM 9.1 12/26/2022 0907   PROT 6.0 (L) 12/26/2022 0907   ALBUMIN 3.4 (L) 12/26/2022 0907   AST 15 12/26/2022 0907   ALT 20 12/26/2022 0907   ALKPHOS 71 12/26/2022 0907   BILITOT 0.3 12/26/2022 0907   GFRNONAA 42 (L) 12/26/2022 0907   GFRAA >60 07/15/2018 1414     ASSESSMENT and THERAPY PLAN:   Bladder cancer metastasized to lung [facility]) Morgan Torres is a 72 year old woman with metastatic urothelial carcinoma currently on treatment with ovale Mab given every 14 days.  Metastatic urothelial carcinoma: She has no clinical signs of metastatic cancer progression.  Her labs today are reviewed and stable from prior.  She will proceed with treatment today with Avelumab.  She appears to be tolerating it well.   Recent URI: Mild residual cough that is slowly improving.  No other signs of persistent infection or concerns. Diarrhea and dehydration: This is related to the Augmentin that she was taking.  Her diarrhea is improving but we will give her a liter of normal saline today.  I ordered stool studies for her to take the containers home.  If her diarrhea becomes watery she will collect her stool and bring it back to [place] in clinic.  Phelicia will f/u with Dr. Arbutus Ped in 2 weeks for labs,  evaluation, and her next treatment.    All questions were answered. The patient knows to call the clinic with any problems, questions or concerns. We can certainly see the patient much sooner if necessary.  Total encounter time:20 minutes*in face-to-face visit time, chart review, lab review, care coordination, order entry, and documentation of the encounter time.    Lillard Anes, NP 12/26/22 10:08 AM Medical Oncology and Hematology [facility] [address] [place], [place] [address] Tel. [phone]    Fax. [phone]  *Total Encounter Time as defined by the Centers for Medicare and Medicaid Services includes, in addition to the face-to-face time of a patient visit (documented in the note above) non-face-to-face time: obtaining and reviewing outside history, ordering and reviewing medications, tests or procedures, care coordination (communications with other health care professionals or caregivers) and documentation in the medical record.

## 2022-12-27 ENCOUNTER — Other Ambulatory Visit: Payer: Self-pay | Admitting: Cardiology

## 2022-12-29 ENCOUNTER — Other Ambulatory Visit: Payer: Self-pay

## 2023-01-07 DIAGNOSIS — G4733 Obstructive sleep apnea (adult) (pediatric): Secondary | ICD-10-CM | POA: Diagnosis not present

## 2023-01-09 ENCOUNTER — Other Ambulatory Visit: Payer: Self-pay | Admitting: Cardiology

## 2023-01-09 ENCOUNTER — Telehealth: Payer: Self-pay | Admitting: Internal Medicine

## 2023-01-09 MED ORDER — METOPROLOL TARTRATE 25 MG PO TABS: 25.0000 mg | ORAL_TABLET | Freq: Two times a day (BID) | ORAL | 1 refills | Status: DC

## 2023-01-09 NOTE — Telephone Encounter (Signed)
Pt's medication was sent to pt's pharmacy as requested. Confirmation received.  °

## 2023-01-09 NOTE — Telephone Encounter (Signed)
*  STAT* If patient is at the pharmacy, call can be transferred to refill team.   1. Which medications need to be refilled? (please list name of each medication and dose if known) metoprolol tartrate (LOPRESSOR) 25 MG tablet   2. Which pharmacy/location (including street and city if local pharmacy) is medication to be sent to?  [id number] - [place], [place] - [address]    3. Do they need a 30 day or 90 day supply? 90   Patient has appt on 8/20

## 2023-01-10 ENCOUNTER — Other Ambulatory Visit: Payer: Self-pay

## 2023-01-10 ENCOUNTER — Inpatient Hospital Stay: Payer: Medicare Other

## 2023-01-10 ENCOUNTER — Other Ambulatory Visit: Payer: Medicare Other

## 2023-01-10 ENCOUNTER — Inpatient Hospital Stay: Payer: Medicare Other | Admitting: Nutrition

## 2023-01-10 ENCOUNTER — Inpatient Hospital Stay: Payer: Medicare Other | Admitting: Internal Medicine

## 2023-01-10 ENCOUNTER — Encounter: Payer: Self-pay | Admitting: *Deleted

## 2023-01-10 VITALS — BP 103/50 | HR 75 | Temp 98.6°F | Resp 16

## 2023-01-10 VITALS — BP 125/75 | HR 97 | Temp 97.6°F | Resp 18 | Wt 169.3 lb

## 2023-01-10 DIAGNOSIS — Z79899 Other long term (current) drug therapy: Secondary | ICD-10-CM | POA: Diagnosis not present

## 2023-01-10 DIAGNOSIS — Z885 Allergy status to narcotic agent status: Secondary | ICD-10-CM | POA: Diagnosis not present

## 2023-01-10 DIAGNOSIS — R197 Diarrhea, unspecified: Secondary | ICD-10-CM | POA: Diagnosis not present

## 2023-01-10 DIAGNOSIS — E860 Dehydration: Secondary | ICD-10-CM | POA: Diagnosis not present

## 2023-01-10 DIAGNOSIS — D649 Anemia, unspecified: Secondary | ICD-10-CM

## 2023-01-10 DIAGNOSIS — C7800 Secondary malignant neoplasm of unspecified lung: Secondary | ICD-10-CM | POA: Diagnosis not present

## 2023-01-10 DIAGNOSIS — Z8249 Family history of ischemic heart disease and other diseases of the circulatory system: Secondary | ICD-10-CM | POA: Diagnosis not present

## 2023-01-10 DIAGNOSIS — Z881 Allergy status to other antibiotic agents status: Secondary | ICD-10-CM | POA: Diagnosis not present

## 2023-01-10 DIAGNOSIS — Z95828 Presence of other vascular implants and grafts: Secondary | ICD-10-CM

## 2023-01-10 DIAGNOSIS — C7910 Secondary malignant neoplasm of unspecified urinary organs: Secondary | ICD-10-CM

## 2023-01-10 DIAGNOSIS — Z833 Family history of diabetes mellitus: Secondary | ICD-10-CM | POA: Diagnosis not present

## 2023-01-10 DIAGNOSIS — Z9089 Acquired absence of other organs: Secondary | ICD-10-CM | POA: Diagnosis not present

## 2023-01-10 DIAGNOSIS — R059 Cough, unspecified: Secondary | ICD-10-CM | POA: Diagnosis not present

## 2023-01-10 DIAGNOSIS — Z803 Family history of malignant neoplasm of breast: Secondary | ICD-10-CM | POA: Diagnosis not present

## 2023-01-10 DIAGNOSIS — F17210 Nicotine dependence, cigarettes, uncomplicated: Secondary | ICD-10-CM | POA: Diagnosis not present

## 2023-01-10 DIAGNOSIS — I252 Old myocardial infarction: Secondary | ICD-10-CM | POA: Diagnosis not present

## 2023-01-10 DIAGNOSIS — Z809 Family history of malignant neoplasm, unspecified: Secondary | ICD-10-CM | POA: Diagnosis not present

## 2023-01-10 DIAGNOSIS — C679 Malignant neoplasm of bladder, unspecified: Secondary | ICD-10-CM

## 2023-01-10 DIAGNOSIS — Z5112 Encounter for antineoplastic immunotherapy: Secondary | ICD-10-CM | POA: Diagnosis not present

## 2023-01-10 LAB — CBC WITH DIFFERENTIAL (CANCER CENTER ONLY)
Abs Immature Granulocytes: 0.03 10*3/uL (ref 0.00–0.07)
Basophils Absolute: 0 10*3/uL (ref 0.0–0.1)
Basophils Relative: 0 %
Eosinophils Absolute: 0.1 10*3/uL (ref 0.0–0.5)
Eosinophils Relative: 1 %
HCT: 41.4 % (ref 36.0–46.0)
Hemoglobin: 14.1 g/dL (ref 12.0–15.0)
Immature Granulocytes: 0 %
Lymphocytes Relative: 35 %
Lymphs Abs: 3.1 10*3/uL (ref 0.7–4.0)
MCH: 34.5 pg — ABNORMAL HIGH (ref 26.0–34.0)
MCHC: 34.1 g/dL (ref 30.0–36.0)
MCV: 101.2 fL — ABNORMAL HIGH (ref 80.0–100.0)
Monocytes Absolute: 0.6 10*3/uL (ref 0.1–1.0)
Monocytes Relative: 7 %
Neutro Abs: 5.1 10*3/uL (ref 1.7–7.7)
Neutrophils Relative %: 57 %
Platelet Count: 291 10*3/uL (ref 150–400)
RBC: 4.09 MIL/uL (ref 3.87–5.11)
RDW: 13.4 % (ref 11.5–15.5)
WBC Count: 8.9 10*3/uL (ref 4.0–10.5)
nRBC: 0 % (ref 0.0–0.2)

## 2023-01-10 LAB — CMP (CANCER CENTER ONLY)
ALT: 30 U/L (ref 0–44)
AST: 21 U/L (ref 15–41)
Albumin: 3.8 g/dL (ref 3.5–5.0)
Alkaline Phosphatase: 74 U/L (ref 38–126)
Anion gap: 9 (ref 5–15)
BUN: 28 mg/dL — ABNORMAL HIGH (ref 8–23)
CO2: 23 mmol/L (ref 22–32)
Calcium: 9.4 mg/dL (ref 8.9–10.3)
Chloride: 102 mmol/L (ref 98–111)
Creatinine: 1.82 mg/dL — ABNORMAL HIGH (ref 0.44–1.00)
GFR, Estimated: 29 mL/min — ABNORMAL LOW (ref >60)
Glucose, Bld: 177 mg/dL — ABNORMAL HIGH (ref 70–99)
Potassium: 4.2 mmol/L (ref 3.5–5.1)
Sodium: 134 mmol/L — ABNORMAL LOW (ref 135–145)
Total Bilirubin: 0.2 mg/dL — ABNORMAL LOW (ref 0.3–1.2)
Total Protein: 6.9 g/dL (ref 6.5–8.1)

## 2023-01-10 LAB — SAMPLE TO BLOOD BANK

## 2023-01-10 MED ORDER — SODIUM CHLORIDE 0.9 % IV SOLN: INTRAVENOUS | Status: AC

## 2023-01-10 MED ORDER — HEPARIN SOD (PORK) LOCK FLUSH 100 UNIT/ML IV SOLN
500.0000 [IU] | Freq: Once | INTRAVENOUS | Status: AC | PRN
  Administered 2023-01-10: 500 [IU]

## 2023-01-10 MED ORDER — SODIUM CHLORIDE 0.9% FLUSH
10.0000 mL | Freq: Once | INTRAVENOUS | Status: AC
  Administered 2023-01-10: 10 mL

## 2023-01-10 MED ORDER — SODIUM CHLORIDE 0.9 % IV SOLN
800.0000 mg | Freq: Once | INTRAVENOUS | Status: AC
  Administered 2023-01-10: 800 mg via INTRAVENOUS
  Filled 2023-01-10: qty 40

## 2023-01-10 MED ORDER — SODIUM CHLORIDE 0.9% FLUSH
10.0000 mL | INTRAVENOUS | Status: DC | PRN
  Administered 2023-01-10: 10 mL

## 2023-01-10 MED ORDER — SODIUM CHLORIDE 0.9 % IV SOLN: Freq: Once | INTRAVENOUS | Status: DC

## 2023-01-10 NOTE — Progress Notes (Signed)
[facility] [phone]   [phone]  OFFICE PROGRESS NOTE  Morgan Mires, MD [address] [place] [address]  DIAGNOSIS: Metastatic urothelial carcinoma that was initially diagnosed as stage II (T2, N0, M0) in September 2022 status post right nephroureterectomy as well as intravesical treatment and resection of superficial tumor in the bladder several times and March 2023 as well as July 2023.  The patient was found to have enlarging and new pulmonary nodules consistent with metastatic disease in November 2023.      PRIOR THERAPY: 1) Right nephroureterectomy as well as intravesical treatment and resection of superficial tumor in the bladder several times and March 2023 as well as July 2023.  2) Palliative systemic chemotherapy with carboplatin for AUC of 5 on day 1 and gemcitabine 1000 mg/M2 on days 1 and 8 every 3 weeks. First cycle June 21, 2022. Status post 6 cycles. Her dose of carboplatin was reduced to AUC of 4 and gemcitabine to 800 mg/m2 starting from cycle #5 due to cytopenias.   CURRENT THERAPY:  Maintenance immunotherapy with Avelumab 800 mg IV every 2 weeks. First dose on 11/15/22. Status post 4 cycle of treatment.   INTERVAL HISTORY: Morgan Torres 72 y.o. female returns to the clinic today for follow-up visit.  The patient is feeling fine today with no concerning complaints except for lack of appetite and occasional dizzy spells.  She denied having any current chest pain, shortness of breath, cough or hemoptysis.  She has no nausea, vomiting, diarrhea or constipation.  She has no headache or visual changes.  She continues to tolerate her treatment with Avelumab Mab fairly well.  The patient is here today for evaluation before starting cycle #5.   MEDICAL HISTORY: Past Medical History:  Diagnosis Date   Anxiety    Arthritis    hip, lumbar spine    Asthma    seasonal allergies    Bronchitis    Hx: of   Chronic kidney  disease    COPD (chronic obstructive pulmonary disease) (HCC)    Coronary artery disease    Depression    Fibromyalgia    Hypertension    Lumbar herniated disc    Myocardial infarction (HCC) 06/25/2012   followed by Dr. Rennis Golden, treated medically, no stents   Peripheral arterial disease (HCC)    of right foot   Pneumonia    Hx: of yrs ago   Sciatica    Sleep apnea    Small bowel obstruction (HCC)    several yrs ago    ALLERGIES:  is allergic to diclofenac, codeine, hydrocodone, and ciprofloxacin.  MEDICATIONS:  Current Outpatient Medications  Medication Sig Dispense Refill   acetaminophen (TYLENOL) 500 MG tablet Take 1 tablet (500 mg total) by mouth every 6 (six) hours as needed for mild pain. 30 tablet 0   albuterol (PROVENTIL HFA;VENTOLIN HFA) 108 (90 Base) MCG/ACT inhaler Inhale 2 puffs into the lungs every 6 (six) hours as needed for wheezing or shortness of breath. 1 Inhaler 0   amLODipine (NORVASC) 5 MG tablet TAKE 1 TABLET BY MOUTH EVERY DAY 15 tablet 0   aspirin EC 81 MG tablet Take 81 mg by mouth daily. Swallow whole.     atorvastatin (LIPITOR) 20 MG tablet Take 20 mg by mouth. Monday wed and Friday in am     cilostazol (PLETAL) 100 MG tablet Take 100 mg by mouth 2 (two) times daily.  EPINEPHrine (EPIPEN 2-PAK) 0.3 mg/0.3 mL DEVI Inject 0.3 mLs (0.3 mg total) into the muscle once as needed (for severe allergic reaction). CAll 911 immediately if you have to use this medicine 1 Device 1   HYDROcodone-acetaminophen (NORCO/VICODIN) 5-325 MG tablet Take 1 tablet by mouth 3 (three) times daily as needed.     lidocaine-prilocaine (EMLA) cream Apply to the Port-A-Cath site 30-60-minute before treatment 30 g 2   LORazepam (ATIVAN) 0.5 MG tablet Take 0.5 mg by mouth daily as needed for anxiety.     losartan (COZAAR) 100 MG tablet Take 100 mg by mouth daily.     losartan-hydrochlorothiazide (HYZAAR) 100-12.5 MG tablet Take 1 tablet by mouth daily.     metoprolol tartrate  (LOPRESSOR) 25 MG tablet Take 1 tablet (25 mg total) by mouth 2 (two) times daily. Pt must keep upcoming appt in August 2024 with Cardiologist before anymore refills. Thank you Final attempt 60 tablet 1   nicotine (NICODERM CQ - DOSED IN MG/24 HOURS) 21 mg/24hr patch Place 21 mg onto the skin daily.     nicotine polacrilex (NICORETTE) 4 MG gum Take 4 mg by mouth as needed for smoking cessation.     nitroGLYCERIN (NITROSTAT) 0.4 MG SL tablet Place 1 tablet (0.4 mg total) under the tongue every 5 (five) minutes x 3 doses as needed for chest pain. 25 tablet 2   ondansetron (ZOFRAN) 8 MG tablet Take 1 tablet (8 mg total) by mouth every 8 (eight) hours as needed for nausea or vomiting. 30 tablet 1   oxybutynin (DITROPAN) 5 MG tablet Take 1 tablet (5 mg total) by mouth every 8 (eight) hours as needed for bladder spasms. 30 tablet 1   polyethylene glycol (MIRALAX / GLYCOLAX) 17 g packet Take 17 g by mouth as needed.     prochlorperazine (COMPAZINE) 10 MG tablet Take 1 tablet (10 mg total) by mouth every 6 (six) hours as needed for nausea or vomiting. 30 tablet 0   traMADol (ULTRAM) 50 MG tablet Take by mouth every 6 (six) hours as needed. Not taking     No current facility-administered medications for this visit.    SURGICAL HISTORY:  Past Surgical History:  Procedure Laterality Date   ABDOMINAL SURGERY     resection of small intestine   ANTERIOR CRUCIATE LIGAMENT REPAIR Bilateral    BACK SURGERY     fusion of lumbar   CARDIAC CATHETERIZATION  06/25/2012   COLON SURGERY     resection for bowel - for obstruction  6 to 7 yrs ago per pt on 01-05-2022   COLONOSCOPY     Hx; of   DILATION AND CURETTAGE OF UTERUS     EYE SURGERY Bilateral    cataracts removed   HERNIA REPAIR  02/23/1969   inguinal hernia    IR IMAGING GUIDED PORT INSERTION  06/07/2022   LEFT HEART CATHETERIZATION WITH CORONARY ANGIOGRAM N/A 03/30/2013   Procedure: LEFT HEART CATHETERIZATION WITH CORONARY ANGIOGRAM;  Surgeon: Marykay Lex, MD;  Location: [facility] CATH LAB;  Service: Cardiovascular;  Laterality: N/A;   left thumb surgery     yrs ago   ROBOT ASSITED LAPAROSCOPIC NEPHROURETERECTOMY Left 03/09/2021   Procedure: XI ROBOT ASSITED LAPAROSCOPIC NEPHROURETERECTOMY/ CYSTOSCOPY WITH LEFT URETEROSCOPY WITH TRANSURETHRAL RESECTION OF URETERAL ORIFICE;  Surgeon: Rene Paci, MD;  Location: [facility] ORS;  Service: Urology;  Laterality: Left;   SALIVARY GLAND SURGERY Left    approached from inside mouth & side of neck 1970-1980   TONSILLECTOMY  as child   TOTAL HIP ARTHROPLASTY Left 05/28/2014   Procedure: LEFT TOTAL HIP ARTHROPLASTY;  Surgeon: Thera Flake., MD;  Location: [facility] OR;  Service: Orthopedics;  Laterality: Left;   TRANSURETHRAL RESECTION OF BLADDER TUMOR N/A 08/23/2021   Procedure: TRANSURETHRAL RESECTION OF BLADDER TUMOR (TURBT) WITH CYSTOSCOPY/ POSTOPERATIVE INSTILLATION OF GEMCITABINE/retrograde pyelogram and stent placement (right);  Surgeon: Rene Paci, MD;  Location: [facility];  Service: Urology;  Laterality: N/A;   TRANSURETHRAL RESECTION OF BLADDER TUMOR N/A 01/10/2022   Procedure: TRANSURETHRAL RESECTION OF BLADDER TUMOR (TURBT) WITH CYSTOSCOPY / GEMCITABINE INSTILLATION post-operatively;  Surgeon: Rene Paci, MD;  Location: [facility];  Service: Urology;  Laterality: N/A;  ONLY NEEDS 30 MIN    REVIEW OF SYSTEMS:  A comprehensive review of systems was negative except for: Constitutional: positive for fatigue and weight loss Neurological: positive for dizziness   PHYSICAL EXAMINATION: General appearance: alert, cooperative, fatigued, and no distress Head: Normocephalic, without obvious abnormality, atraumatic Neck: no adenopathy, no JVD, supple, symmetrical, trachea midline, and thyroid not enlarged, symmetric, no tenderness/mass/nodules Lymph nodes: Cervical, supraclavicular, and axillary nodes normal. Resp: clear to auscultation  bilaterally Back: symmetric, no curvature. ROM normal. No CVA tenderness. Cardio: regular rate and rhythm, S1, S2 normal, no murmur, click, rub or gallop GI: soft, non-tender; bowel sounds normal; no masses,  no organomegaly Extremities: extremities normal, atraumatic, no cyanosis or edema  ECOG PERFORMANCE STATUS: 1 - Symptomatic but completely ambulatory  Blood pressure 125/75, pulse 97, temperature 97.6 F (36.4 C), temperature source Oral, resp. rate 18, weight 169 lb 4.8 oz (76.8 kg), SpO2 100%.  LABORATORY DATA: Lab Results  Component Value Date   WBC 8.9 01/10/2023   HGB 14.1 01/10/2023   HCT 41.4 01/10/2023   MCV 101.2 (H) 01/10/2023   PLT 291 01/10/2023      Chemistry      Component Value Date/Time   NA 133 (L) 12/26/2022 0907   K 4.5 12/26/2022 0907   CL 104 12/26/2022 0907   CO2 24 12/26/2022 0907   BUN 29 (H) 12/26/2022 0907   CREATININE 1.33 (H) 12/26/2022 0907      Component Value Date/Time   CALCIUM 9.1 12/26/2022 0907   ALKPHOS 71 12/26/2022 0907   AST 15 12/26/2022 0907   ALT 20 12/26/2022 0907   BILITOT 0.3 12/26/2022 0907       RADIOGRAPHIC STUDIES: No results found.  ASSESSMENT AND PLAN: This is a very pleasant 72 years old African-American female with metastatic urothelial carcinoma initially diagnosed as a stage II (T2, N0, M0) in September 2022 status post right nephroureterectomy as well as intravesical treatment and resection of superficial tumor of the bladder several times in March 2023 and July 2023. The patient was found to have metastatic disease in November 2023. She underwent systemic chemotherapy with 6 cycles of carboplatin and gemcitabine. She has no evidence for disease progression after cycle #6.  The patient started maintenance treatment with Avelumab 800 Mg IV every 2 weeks status post 4 cycles.  She has been tolerating this treatment fairly well with no concerning adverse effect except for the mild fatigue and lack of appetite. I  recommended for her to proceed with cycle #5 today as planned. She will come back for follow-up visit in 2 weeks for evaluation before the next cycle of her treatment. I will consider repeating her imaging staging workup after cycle #6. For the tachycardia and dehydration, I will arrange for the patient to  receive 500 cc of normal saline in the clinic today. She was encouraged to increase her oral intake and snacking between meals. The patient was advised to call immediately if she has any other concerning symptoms in the interval. The patient voices understanding of current disease status and treatment options and is in agreement with the current care plan.  All questions were answered. The patient knows to call the clinic with any problems, questions or concerns. We can certainly see the patient much sooner if necessary.  The total time spent in the appointment was 20 minutes.  Disclaimer: This note was dictated with voice recognition software. Similar sounding words can inadvertently be transcribed and may not be corrected upon review.

## 2023-01-10 NOTE — Progress Notes (Signed)
Nutrition follow-up completed with patient during treatment for metastatic urothelial carcinoma.  She is followed by Dr. Arbutus Ped.  Weight documented as 169 pounds 4.8 ounces today down from 171 pounds 8 ounces on June 19.  Patient has lost 12% body weight in less than 6 months which is clinically significant.  Labs include sodium 134, glucose 177, BUN 28, and creatinine 1.82.  Patient complains of nausea and poor appetite.  She continues to have taste alterations.  She has not tried baking soda and salt water gargle to improve taste.  States yesterday she made cabbage and squash and it was tasty.  She is trying to drink Ensure Plus.  States when she eats she can only eat small amounts.  Verbalizes that she does not want to continue to have weight loss.  She is drinking approximately 48 ounces of water daily.  Nutrition diagnosis: Unintended weight loss continues.  Intervention: Proactively manage nausea with antiemetics. Educated on importance of smaller more frequent meals and snacks.  Reviewed high-protein foods and provided an additional fact sheet. Suggested patient try colder foods. Continue bowel management. Increase water to a minimum of 64 ounces daily. Continue Ensure Plus or equivalent.   Suggested Carnation breakfast powder mixed with almond milk (patient's preference)  Monitoring, evaluation, goals: Patient will tolerate increased calories and protein to minimize further weight loss.  Next visit: Thursday, August 1 during infusion.  **Disclaimer: This note was dictated with voice recognition software. Similar sounding words can inadvertently be transcribed and this note may contain transcription errors which may not have been corrected upon publication of note.**

## 2023-01-10 NOTE — Patient Instructions (Signed)
[facility]  Discharge Instructions: Thank you for choosing [facility] to provide your oncology and hematology care.   If you have a lab appointment with the Cancer Center, please go directly to the Cancer Center and check in at the registration area.   Wear comfortable clothing and clothing appropriate for easy access to any Portacath or PICC line.   We strive to give you quality time with your provider. You may need to reschedule your appointment if you arrive late (15 or more minutes).  Arriving late affects you and other patients whose appointments are after yours.  Also, if you miss three or more appointments without notifying the office, you may be dismissed from the clinic at the provider's discretion.      For prescription refill requests, have your pharmacy contact our office and allow 72 hours for refills to be completed.    Today you received the following chemotherapy and/or immunotherapy agents : Bavencio      To help prevent nausea and vomiting after your treatment, we encourage you to take your nausea medication as directed.  BELOW ARE SYMPTOMS THAT SHOULD BE REPORTED IMMEDIATELY: *FEVER GREATER THAN 100.4 F (38 C) OR HIGHER *CHILLS OR SWEATING *NAUSEA AND VOMITING THAT IS NOT CONTROLLED WITH YOUR NAUSEA MEDICATION *UNUSUAL SHORTNESS OF BREATH *UNUSUAL BRUISING OR BLEEDING *URINARY PROBLEMS (pain or burning when urinating, or frequent urination) *BOWEL PROBLEMS (unusual diarrhea, constipation, pain near the anus) TENDERNESS IN MOUTH AND THROAT WITH OR WITHOUT PRESENCE OF ULCERS (sore throat, sores in mouth, or a toothache) UNUSUAL RASH, SWELLING OR PAIN  UNUSUAL VAGINAL DISCHARGE OR ITCHING   Items with * indicate a potential emergency and should be followed up as soon as possible or go to the Emergency Department if any problems should occur.  Please show the CHEMOTHERAPY ALERT CARD or IMMUNOTHERAPY ALERT CARD at  check-in to the Emergency Department and triage nurse.  Should you have questions after your visit or need to cancel or reschedule your appointment, please contact [facility]  Dept: [phone]  and follow the prompts.  Office hours are 8:00 a.m. to 4:30 p.m. Monday - Friday. Please note that voicemails left after 4:00 p.m. may not be returned until the following business day.  We are closed weekends and major holidays. You have access to a nurse at all times for urgent questions. Please call the main number to the clinic Dept: [phone] and follow the prompts.   For any non-urgent questions, you may also contact your provider using MyChart. We now offer e-Visits for anyone 59 and older to request care online for non-urgent symptoms. For details visit [email or web address].   Also download the MyChart app! Go to the app store, search "MyChart", open the app, select [facility], and log in with your MyChart username and password.

## 2023-01-10 NOTE — Progress Notes (Signed)
Patient seen by Dr. Gypsy Balsam are not all within treatment parameters. HR 106 Ok to treat per Dr. Clarnce Flock reviewed: and are not all within treatment parameters. Ok to treat with Creatinine  of  1.82  Per physician team, patient is ready for treatment. Please note that modifications are being made to the treatment plan including IV Fluids have been added to her treatment today. There are orders under signed and held.

## 2023-01-13 ENCOUNTER — Encounter (HOSPITAL_COMMUNITY): Payer: Self-pay

## 2023-01-13 ENCOUNTER — Ambulatory Visit (HOSPITAL_COMMUNITY)
Admission: EM | Admit: 2023-01-13 | Discharge: 2023-01-13 | Disposition: A | Discharge status: Home / Self Care | Payer: Medicare Other | Attending: Emergency Medicine | Admitting: Emergency Medicine

## 2023-01-13 DIAGNOSIS — K05219 Aggressive periodontitis, localized, unspecified severity: Secondary | ICD-10-CM | POA: Diagnosis not present

## 2023-01-13 MED ORDER — AMOXICILLIN-POT CLAVULANATE 875-125 MG PO TABS: 1.0000 | ORAL_TABLET | Freq: Two times a day (BID) | ORAL | 0 refills | Status: DC

## 2023-01-13 MED ORDER — AMOXICILLIN 500 MG PO CAPS: 500.0000 mg | ORAL_CAPSULE | Freq: Two times a day (BID) | ORAL | 0 refills | Status: AC

## 2023-01-13 NOTE — Discharge Instructions (Addendum)
Today you were treated for a mouth abscess  Take Amoxicillin every morning and every evening for 7 days  Avoid acidic, citrus and alcohol-based food and products as this will cause irritation  May gargle and spit Magic mouthwash solution every 4-6 hours to provide temporary relief to your mouth  May take Tylenol and or Motrin as needed for management of pain  May follow-up with your primary doctor for reevaluation  May follow-up with your dentist for evaluation of the teeth

## 2023-01-13 NOTE — ED Provider Notes (Signed)
[facility]    CSN: [id number] Arrival date & time: 01/13/23  1625      History   Chief Complaint Chief Complaint  Patient presents with   Abscess    HPI Morgan Torres is a 72 y.o. female.   Patient presents for evaluation for an abscess present to the left upper gumline beginning today.  Painful making it difficult to chew, decreased oral intake.  Does not have teeth along the upper gumline, uses dentures minimally as they do not fit well.  Denies presence of drainage or fever.  Has attempted to gargle salt water.   Past Medical History:  Diagnosis Date   Anxiety    Arthritis    hip, lumbar spine    Asthma    seasonal allergies    Bronchitis    Hx: of   Chronic kidney disease    COPD (chronic obstructive pulmonary disease) (HCC)    Coronary artery disease    Depression    Fibromyalgia    Hypertension    Lumbar herniated disc    Myocardial infarction (HCC) 06/25/2012   followed by Dr. Rennis Golden, treated medically, no stents   Peripheral arterial disease (HCC)    of right foot   Pneumonia    Hx: of yrs ago   Sciatica    Sleep apnea    Small bowel obstruction (HCC)    several yrs ago    Patient Active Problem List   Diagnosis Date Noted   Encounter for antineoplastic immunotherapy 11/29/2022   Goals of care, counseling/discussion 11/05/2022   Port-A-Cath in place 07/12/2022   Metastatic urothelial carcinoma (HCC) 05/30/2022   Bladder cancer metastasized to lung (HCC) 05/30/2022   Encounter for antineoplastic chemotherapy 05/30/2022   Renal mass 03/09/2021   Morbid obesity, unspecified obesity type (HCC) 08/28/2016   Non-restorative sleep 08/28/2016   Snoring 08/28/2016   Hip pain 06/03/2014   Acute blood loss anemia 06/01/2014   Osteoarthritis of left hip 05/28/2014   Obesity (BMI 30.0-34.9) 04/20/2013   Nonischemic cardiomyopathy (HCC) 04/01/2013   Smoking 04/01/2013   NSTEMI - Troponin pk 1.5 with ant TWI on EKG 03/29/2013   Spinal stenosis,  lumbar region, with neurogenic claudication 01/23/2013   VAGINAL TRICHOMONIASIS 06/28/2006   Hyperlipidemia 06/28/2006   ANEMIA-NOS 06/28/2006   ANXIETY 06/28/2006   DEPRESSION 06/28/2006   SYNDROME, CARPAL TUNNEL 06/28/2006   Essential hypertension 06/28/2006   ALLERGIC RHINITIS 06/28/2006   ASTHMA 06/28/2006   PAROTIDITIS 06/28/2006   LOW BACK PAIN 06/28/2006   TB SKIN TEST, POSITIVE, HX OF 06/28/2006    Past Surgical History:  Procedure Laterality Date   ABDOMINAL SURGERY     resection of small intestine   ANTERIOR CRUCIATE LIGAMENT REPAIR Bilateral    BACK SURGERY     fusion of lumbar   CARDIAC CATHETERIZATION  06/25/2012   COLON SURGERY     resection for bowel - for obstruction  6 to 7 yrs ago per pt on 01-05-2022   COLONOSCOPY     Hx; of   DILATION AND CURETTAGE OF UTERUS     EYE SURGERY Bilateral    cataracts removed   HERNIA REPAIR  02/23/1969   inguinal hernia    IR IMAGING GUIDED PORT INSERTION  06/07/2022   LEFT HEART CATHETERIZATION WITH CORONARY ANGIOGRAM N/A 03/30/2013   Procedure: LEFT HEART CATHETERIZATION WITH CORONARY ANGIOGRAM;  Surgeon: Marykay Lex, MD;  Location: [facility] CATH LAB;  Service: Cardiovascular;  Laterality: N/A;   left thumb surgery  yrs ago   ROBOT ASSITED LAPAROSCOPIC NEPHROURETERECTOMY Left 03/09/2021   Procedure: XI ROBOT ASSITED LAPAROSCOPIC NEPHROURETERECTOMY/ CYSTOSCOPY WITH LEFT URETEROSCOPY WITH TRANSURETHRAL RESECTION OF URETERAL ORIFICE;  Surgeon: Rene Paci, MD;  Location: [facility];  Service: Urology;  Laterality: Left;   SALIVARY GLAND SURGERY Left    approached from inside mouth & side of neck 1970-1980   TONSILLECTOMY     as child   TOTAL HIP ARTHROPLASTY Left 05/28/2014   Procedure: LEFT TOTAL HIP ARTHROPLASTY;  Surgeon: Thera Flake., MD;  Location: [facility];  Service: Orthopedics;  Laterality: Left;   TRANSURETHRAL RESECTION OF BLADDER TUMOR N/A 08/23/2021   Procedure: TRANSURETHRAL RESECTION OF BLADDER  TUMOR (TURBT) WITH CYSTOSCOPY/ POSTOPERATIVE INSTILLATION OF GEMCITABINE/retrograde pyelogram and stent placement (right);  Surgeon: Rene Paci, MD;  Location: [facility];  Service: Urology;  Laterality: N/A;   TRANSURETHRAL RESECTION OF BLADDER TUMOR N/A 01/10/2022   Procedure: TRANSURETHRAL RESECTION OF BLADDER TUMOR (TURBT) WITH CYSTOSCOPY / GEMCITABINE INSTILLATION post-operatively;  Surgeon: Rene Paci, MD;  Location: [facility];  Service: Urology;  Laterality: N/A;  ONLY NEEDS 30 MIN    OB History   No obstetric history on file.      Home Medications    Prior to Admission medications   Medication Sig Start Date End Date Taking? Authorizing Provider  acetaminophen (TYLENOL) 500 MG tablet Take 1 tablet (500 mg total) by mouth every 6 (six) hours as needed for mild pain. 10/14/22  Yes Rising, Rebecca, PA-C  amLODipine (NORVASC) 5 MG tablet TAKE 1 TABLET BY MOUTH EVERY DAY 11/08/22  Yes Hilty, Lisette Abu, MD  aspirin EC 81 MG tablet Take 81 mg by mouth daily. Swallow whole.   Yes [provider]  atorvastatin (LIPITOR) 20 MG tablet Take 20 mg by mouth. Monday wed and Friday in am 11/25/20  Yes [provider]  cilostazol (PLETAL) 100 MG tablet Take 100 mg by mouth 2 (two) times daily.   Yes [provider]  LORazepam (ATIVAN) 0.5 MG tablet Take 0.5 mg by mouth daily as needed for anxiety.   Yes [provider]  losartan-hydrochlorothiazide (HYZAAR) 100-12.5 MG tablet Take 1 tablet by mouth daily.   Yes [provider]  metoprolol tartrate (LOPRESSOR) 25 MG tablet Take 1 tablet (25 mg total) by mouth 2 (two) times daily. Pt must keep upcoming appt in August 2024 with Cardiologist before anymore refills. Thank you Final attempt 01/09/23  Yes Hilty, Lisette Abu, MD  nicotine (NICODERM CQ - DOSED IN MG/24 HOURS) 21 mg/24hr patch Place 21 mg onto the skin daily.   Yes [provider]   nicotine polacrilex (NICORETTE) 4 MG gum Take 4 mg by mouth as needed for smoking cessation.   Yes [provider]  ondansetron (ZOFRAN) 8 MG tablet Take 1 tablet (8 mg total) by mouth every 8 (eight) hours as needed for nausea or vomiting. 11/14/22  Yes Si Gaul, MD  oxybutynin (DITROPAN) 5 MG tablet Take 1 tablet (5 mg total) by mouth every 8 (eight) hours as needed for bladder spasms. 08/23/21  Yes Rene Paci, MD  polyethylene glycol (MIRALAX / GLYCOLAX) 17 g packet Take 17 g by mouth as needed.   Yes [provider]  albuterol (PROVENTIL HFA;VENTOLIN HFA) 108 (90 Base) MCG/ACT inhaler Inhale 2 puffs into the lungs every 6 (six) hours as needed for wheezing or shortness of breath. 05/10/16   Sudie Grumbling, NP  EPINEPHrine (EPIPEN 2-PAK)  0.3 mg/0.3 mL DEVI Inject 0.3 mLs (0.3 mg total) into the muscle once as needed (for severe allergic reaction). CAll 911 immediately if you have to use this medicine 11/26/12   Piepenbrink, Victorino Dike, PA-C  HYDROcodone-acetaminophen (NORCO/VICODIN) 5-325 MG tablet Take 1 tablet by mouth 3 (three) times daily as needed. 11/12/22   [provider]  lidocaine-prilocaine (EMLA) cream Apply to the Port-A-Cath site 30-60-minute before treatment 08/14/22   Heilingoetter, Cassandra L, PA-C  losartan (COZAAR) 100 MG tablet Take 100 mg by mouth daily. 12/05/22   [provider]  nitroGLYCERIN (NITROSTAT) 0.4 MG SL tablet Place 1 tablet (0.4 mg total) under the tongue every 5 (five) minutes x 3 doses as needed for chest pain. 09/25/18   Hilty, Lisette Abu, MD  prochlorperazine (COMPAZINE) 10 MG tablet Take 1 tablet (10 mg total) by mouth every 6 (six) hours as needed for nausea or vomiting. 06/21/22   Si Gaul, MD  traMADol (ULTRAM) 50 MG tablet Take by mouth every 6 (six) hours as needed. Not taking    [provider]    Family History Family History  Problem Relation Age of Onset   Hypertension Mother     Diabetes Mother    Cancer - Other Mother    Cancer Sister    Breast cancer Daughter     Social History Social History   Tobacco Use   Smoking status: Some Days    Current packs/day: 0.50    Average packs/day: 0.5 packs/day for 25.0 years (12.5 ttl pk-yrs)    Types: Cigarettes   Smokeless tobacco: Never   Tobacco comments:    Currently on the Nicoderm patch."smokes a few"  Vaping Use   Vaping status: Never Used  Substance Use Topics   Alcohol use: Yes    Comment: occasionally   Drug use: No     Allergies   Diclofenac, Codeine, Hydrocodone, and Ciprofloxacin   Review of Systems Review of Systems   Physical Exam Triage Vital Signs ED Triage Vitals  Encounter Vitals Group     BP 01/13/23 1726 106/67     Systolic BP Percentile --      Diastolic BP Percentile --      Pulse Rate 01/13/23 1726 73     Resp 01/13/23 1726 16     Temp 01/13/23 1726 98 F (36.7 C)     Temp Source 01/13/23 1726 Oral     SpO2 01/13/23 1726 93 %     Weight 01/13/23 1725 161 lb (73 kg)     Height 01/13/23 1725 5\' 8"  (1.727 m)     Head Circumference --      Peak Flow --      Pain Score 01/13/23 1723 4     Pain Loc --      Pain Education --      Exclude from Growth Chart --    No data found.  Updated Vital Signs BP 106/67 (BP Location: Left Arm)   Pulse 73   Temp 98 F (36.7 C) (Oral)   Resp 16   Ht 5\' 8"  (1.727 m)   Wt 161 lb (73 kg)   SpO2 93%   BMI 24.48 kg/m   Visual Acuity Right Eye Distance:   Left Eye Distance:   Bilateral Distance:    Right Eye Near:   Left Eye Near:    Bilateral Near:     Physical Exam Constitutional:      Appearance: Normal appearance.  HENT:  Mouth/Throat:     Comments: Abscess present to the left upper gumline, pharynx clear without obstruction Eyes:     Extraocular Movements: Extraocular movements intact.  Pulmonary:     Effort: Pulmonary effort is normal.  Neurological:     Mental Status: She is alert and oriented to person,  place, and time.      UC Treatments / Results  Labs (all labs ordered are listed, but only abnormal results are displayed) Labs Reviewed - No data to display  EKG   Radiology No results found.  Procedures Procedures (including critical care time)  Medications Ordered in UC Medications - No data to display  Initial Impression / Assessment and Plan / UC Course  I have reviewed the triage vital signs and the nursing notes.  Pertinent labs & imaging results that were available during my care of the patient were reviewed by me and considered in my medical decision making (see chart for details).   Abscess of upper gum  Vitals are stable, abscess noted on exam, discussed with patient, prescribed Augmentin, endorses that she has Magic mouthwash at home, recommend increase fluid intake until able to tolerate solids, advised avoidance of acidic citrus and alcohol-based food and products to prevent further irritation, may follow-up with primary doctor for further evaluation as needed Final Clinical Impressions(s) / UC Diagnoses   Final diagnoses:  None     Discharge Instructions      Today you were treated for a mouth abscess  Take Augmentin every morning and every evening for 7 days  Avoid acidic, citrus and alcohol-based food and products as this will cause irritation  May gargle and spit Magic mouthwash solution every 4-6 hours to provide temporary relief to your mouth  May take Tylenol and or Motrin as needed for management of pain  May follow-up with your primary doctor for reevaluation  May follow-up with your dentist for evaluation of the teeth   ED Prescriptions   None    PDMP not reviewed this encounter.   Valinda Hoar, [place] 01/16/23 [phone]

## 2023-01-13 NOTE — ED Triage Notes (Signed)
abscess on mouth onset today. No dental issues that the Patient knows of. No injuries or oral trauma.

## 2023-01-16 ENCOUNTER — Other Ambulatory Visit: Payer: Self-pay

## 2023-01-22 ENCOUNTER — Other Ambulatory Visit: Payer: Self-pay | Admitting: *Deleted

## 2023-01-22 DIAGNOSIS — I739 Peripheral vascular disease, unspecified: Secondary | ICD-10-CM

## 2023-01-23 ENCOUNTER — Other Ambulatory Visit: Payer: Self-pay

## 2023-01-24 ENCOUNTER — Other Ambulatory Visit: Payer: Self-pay

## 2023-01-24 ENCOUNTER — Inpatient Hospital Stay: Payer: Medicare Other | Admitting: Nutrition

## 2023-01-24 ENCOUNTER — Inpatient Hospital Stay (HOSPITAL_BASED_OUTPATIENT_CLINIC_OR_DEPARTMENT_OTHER): Payer: Medicare Other | Admitting: Internal Medicine

## 2023-01-24 ENCOUNTER — Inpatient Hospital Stay: Payer: Medicare Other | Attending: Internal Medicine

## 2023-01-24 ENCOUNTER — Other Ambulatory Visit: Payer: Self-pay | Admitting: Internal Medicine

## 2023-01-24 ENCOUNTER — Telehealth: Payer: Self-pay

## 2023-01-24 ENCOUNTER — Inpatient Hospital Stay: Payer: Medicare Other

## 2023-01-24 VITALS — BP 146/78 | HR 80 | Temp 97.6°F | Resp 17 | Ht 68.0 in | Wt 172.2 lb

## 2023-01-24 VITALS — BP 107/56 | HR 74 | Resp 16

## 2023-01-24 DIAGNOSIS — I700 Atherosclerosis of aorta: Secondary | ICD-10-CM | POA: Insufficient documentation

## 2023-01-24 DIAGNOSIS — C7910 Secondary malignant neoplasm of unspecified urinary organs: Secondary | ICD-10-CM

## 2023-01-24 DIAGNOSIS — C679 Malignant neoplasm of bladder, unspecified: Secondary | ICD-10-CM | POA: Diagnosis not present

## 2023-01-24 DIAGNOSIS — I252 Old myocardial infarction: Secondary | ICD-10-CM | POA: Diagnosis not present

## 2023-01-24 DIAGNOSIS — Z885 Allergy status to narcotic agent status: Secondary | ICD-10-CM | POA: Insufficient documentation

## 2023-01-24 DIAGNOSIS — K5900 Constipation, unspecified: Secondary | ICD-10-CM | POA: Diagnosis not present

## 2023-01-24 DIAGNOSIS — F419 Anxiety disorder, unspecified: Secondary | ICD-10-CM | POA: Diagnosis not present

## 2023-01-24 DIAGNOSIS — R61 Generalized hyperhidrosis: Secondary | ICD-10-CM | POA: Diagnosis not present

## 2023-01-24 DIAGNOSIS — G4700 Insomnia, unspecified: Secondary | ICD-10-CM | POA: Insufficient documentation

## 2023-01-24 DIAGNOSIS — Z79890 Hormone replacement therapy (postmenopausal): Secondary | ICD-10-CM | POA: Insufficient documentation

## 2023-01-24 DIAGNOSIS — Z5112 Encounter for antineoplastic immunotherapy: Secondary | ICD-10-CM | POA: Diagnosis present

## 2023-01-24 DIAGNOSIS — I10 Essential (primary) hypertension: Secondary | ICD-10-CM | POA: Diagnosis not present

## 2023-01-24 DIAGNOSIS — H814 Vertigo of central origin: Secondary | ICD-10-CM

## 2023-01-24 DIAGNOSIS — Z79899 Other long term (current) drug therapy: Secondary | ICD-10-CM | POA: Insufficient documentation

## 2023-01-24 DIAGNOSIS — D649 Anemia, unspecified: Secondary | ICD-10-CM

## 2023-01-24 DIAGNOSIS — Z881 Allergy status to other antibiotic agents status: Secondary | ICD-10-CM | POA: Diagnosis not present

## 2023-01-24 DIAGNOSIS — Z905 Acquired absence of kidney: Secondary | ICD-10-CM | POA: Insufficient documentation

## 2023-01-24 DIAGNOSIS — Z95828 Presence of other vascular implants and grafts: Secondary | ICD-10-CM

## 2023-01-24 DIAGNOSIS — J432 Centrilobular emphysema: Secondary | ICD-10-CM | POA: Insufficient documentation

## 2023-01-24 DIAGNOSIS — C7800 Secondary malignant neoplasm of unspecified lung: Secondary | ICD-10-CM

## 2023-01-24 DIAGNOSIS — K909 Intestinal malabsorption, unspecified: Secondary | ICD-10-CM

## 2023-01-24 LAB — CMP (CANCER CENTER ONLY)
ALT: 47 U/L — ABNORMAL HIGH (ref 0–44)
AST: 36 U/L (ref 15–41)
Albumin: 3.8 g/dL (ref 3.5–5.0)
Alkaline Phosphatase: 68 U/L (ref 38–126)
Anion gap: 7 (ref 5–15)
BUN: 21 mg/dL (ref 8–23)
CO2: 24 mmol/L (ref 22–32)
Calcium: 8.9 mg/dL (ref 8.9–10.3)
Chloride: 99 mmol/L (ref 98–111)
Creatinine: 1.56 mg/dL — ABNORMAL HIGH (ref 0.44–1.00)
GFR, Estimated: 35 mL/min — ABNORMAL LOW (ref >60)
Glucose, Bld: 103 mg/dL — ABNORMAL HIGH (ref 70–99)
Potassium: 4.5 mmol/L (ref 3.5–5.1)
Sodium: 130 mmol/L — ABNORMAL LOW (ref 135–145)
Total Bilirubin: 0.3 mg/dL (ref 0.3–1.2)
Total Protein: 6.4 g/dL — ABNORMAL LOW (ref 6.5–8.1)

## 2023-01-24 LAB — CBC WITH DIFFERENTIAL (CANCER CENTER ONLY)
Abs Immature Granulocytes: 0.04 10*3/uL (ref 0.00–0.07)
Basophils Absolute: 0 10*3/uL (ref 0.0–0.1)
Basophils Relative: 1 %
Eosinophils Absolute: 0.1 10*3/uL (ref 0.0–0.5)
Eosinophils Relative: 1 %
HCT: 36.8 % (ref 36.0–46.0)
Hemoglobin: 13 g/dL (ref 12.0–15.0)
Immature Granulocytes: 1 %
Lymphocytes Relative: 39 %
Lymphs Abs: 2.8 10*3/uL (ref 0.7–4.0)
MCH: 34.5 pg — ABNORMAL HIGH (ref 26.0–34.0)
MCHC: 35.3 g/dL (ref 30.0–36.0)
MCV: 97.6 fL (ref 80.0–100.0)
Monocytes Absolute: 0.5 10*3/uL (ref 0.1–1.0)
Monocytes Relative: 7 %
Neutro Abs: 3.7 10*3/uL (ref 1.7–7.7)
Neutrophils Relative %: 51 %
Platelet Count: 277 10*3/uL (ref 150–400)
RBC: 3.77 MIL/uL — ABNORMAL LOW (ref 3.87–5.11)
RDW: 13.2 % (ref 11.5–15.5)
WBC Count: 7.2 10*3/uL (ref 4.0–10.5)
nRBC: 0 % (ref 0.0–0.2)

## 2023-01-24 LAB — SAMPLE TO BLOOD BANK

## 2023-01-24 LAB — TSH: TSH: 113.179 u[IU]/mL — ABNORMAL HIGH (ref 0.350–4.500)

## 2023-01-24 MED ORDER — HEPARIN SOD (PORK) LOCK FLUSH 100 UNIT/ML IV SOLN
500.0000 [IU] | Freq: Once | INTRAVENOUS | Status: AC | PRN
  Administered 2023-01-24: 500 [IU]

## 2023-01-24 MED ORDER — SODIUM CHLORIDE 0.9 % IV SOLN
800.0000 mg | Freq: Once | INTRAVENOUS | Status: AC
  Administered 2023-01-24: 800 mg via INTRAVENOUS
  Filled 2023-01-24: qty 40

## 2023-01-24 MED ORDER — SODIUM CHLORIDE 0.9 % IV SOLN: Freq: Once | INTRAVENOUS | Status: AC

## 2023-01-24 MED ORDER — LEVOTHYROXINE SODIUM 50 MCG PO TABS: 50.0000 ug | ORAL_TABLET | Freq: Every day | ORAL | 3 refills | Status: DC

## 2023-01-24 MED ORDER — SODIUM CHLORIDE 0.9% FLUSH
10.0000 mL | INTRAVENOUS | Status: DC | PRN
  Administered 2023-01-24: 10 mL

## 2023-01-24 MED ORDER — SODIUM CHLORIDE 0.9% FLUSH
10.0000 mL | Freq: Once | INTRAVENOUS | Status: AC
  Administered 2023-01-24: 10 mL

## 2023-01-24 NOTE — Progress Notes (Signed)
[facility] Telephone:([phone]) [phone]   Fax:([phone]  OFFICE PROGRESS NOTE  [name], MD [address] [place] [address]  DIAGNOSIS: Metastatic urothelial carcinoma that was initially diagnosed as stage II (T2, N0, M0) in September 2022 status post right nephroureterectomy as well as intravesical treatment and resection of superficial tumor in the bladder several times and March 2023 as well as July 2023.  The patient was found to have enlarging and new pulmonary nodules consistent with metastatic disease in November 2023.      PRIOR THERAPY: 1) Right nephroureterectomy as well as intravesical treatment and resection of superficial tumor in the bladder several times and March 2023 as well as July 2023.  2) Palliative systemic chemotherapy with carboplatin for AUC of 5 on day 1 and gemcitabine 1000 mg/M2 on days 1 and 8 every 3 weeks. First cycle June 21, 2022. Status post 6 cycles. Her dose of carboplatin was reduced to AUC of 4 and gemcitabine to 800 mg/m2 starting from cycle #5 due to cytopenias.   CURRENT THERAPY:  Maintenance immunotherapy with Avelumab 800 mg IV every 2 weeks. First dose on 11/15/22. Status post 5 cycle of treatment.   INTERVAL HISTORY: Morgan Torres 72 y.o. female returns to the clinic today for follow-up visit.  The patient is feeling fine today with no concerning complaints except for fatigue, insomnia and occasional dizzy spells.  She denied having any current chest pain, shortness of breath, cough or hemoptysis.  She has no nausea, vomiting but has constipation and she uses milk of magnesia for it.  She denied having any fever or chills.  She has no recent weight loss or night sweats.  She is here today for evaluation before starting cycle #6 of her treatment.   MEDICAL HISTORY: Past Medical History:  Diagnosis Date   Anxiety    Arthritis    hip, lumbar spine    Asthma    seasonal allergies    Bronchitis    Hx: of    Chronic kidney disease    COPD (chronic obstructive pulmonary disease) (HCC)    Coronary artery disease    Depression    Fibromyalgia    Hypertension    Lumbar herniated disc    Myocardial infarction (HCC) 06/25/2012   followed by Dr. Rennis Golden, treated medically, no stents   Peripheral arterial disease (HCC)    of right foot   Pneumonia    Hx: of yrs ago   Sciatica    Sleep apnea    Small bowel obstruction (HCC)    several yrs ago    ALLERGIES:  is allergic to diclofenac, codeine, hydrocodone, and ciprofloxacin.  MEDICATIONS:  Current Outpatient Medications  Medication Sig Dispense Refill   acetaminophen (TYLENOL) 500 MG tablet Take 1 tablet (500 mg total) by mouth every 6 (six) hours as needed for mild pain. 30 tablet 0   albuterol (PROVENTIL HFA;VENTOLIN HFA) 108 (90 Base) MCG/ACT inhaler Inhale 2 puffs into the lungs every 6 (six) hours as needed for wheezing or shortness of breath. 1 Inhaler 0   amLODipine (NORVASC) 5 MG tablet TAKE 1 TABLET BY MOUTH EVERY DAY 15 tablet 0   aspirin EC 81 MG tablet Take 81 mg by mouth daily. Swallow whole.     atorvastatin (LIPITOR) 20 MG tablet Take 20 mg by mouth. Monday wed and Friday in am     cilostazol (PLETAL) 100 MG tablet Take 100 mg by mouth 2 (  two) times daily.     EPINEPHrine (EPIPEN 2-PAK) 0.3 mg/0.3 mL DEVI Inject 0.3 mLs (0.3 mg total) into the muscle once as needed (for severe allergic reaction). CAll 911 immediately if you have to use this medicine 1 Device 1   HYDROcodone-acetaminophen (NORCO/VICODIN) 5-325 MG tablet Take 1 tablet by mouth 3 (three) times daily as needed.     lidocaine-prilocaine (EMLA) cream Apply to the Port-A-Cath site 30-60-minute before treatment 30 g 2   LORazepam (ATIVAN) 0.5 MG tablet Take 0.5 mg by mouth daily as needed for anxiety.     losartan (COZAAR) 100 MG tablet Take 100 mg by mouth daily.     losartan-hydrochlorothiazide (HYZAAR) 100-12.5 MG tablet Take 1 tablet by mouth daily.     metoprolol  tartrate (LOPRESSOR) 25 MG tablet Take 1 tablet (25 mg total) by mouth 2 (two) times daily. Pt must keep upcoming appt in August 2024 with Cardiologist before anymore refills. Thank you Final attempt 60 tablet 1   nicotine (NICODERM CQ - DOSED IN MG/24 HOURS) 21 mg/24hr patch Place 21 mg onto the skin daily.     nicotine polacrilex (NICORETTE) 4 MG gum Take 4 mg by mouth as needed for smoking cessation.     nitroGLYCERIN (NITROSTAT) 0.4 MG SL tablet Place 1 tablet (0.4 mg total) under the tongue every 5 (five) minutes x 3 doses as needed for chest pain. 25 tablet 2   ondansetron (ZOFRAN) 8 MG tablet Take 1 tablet (8 mg total) by mouth every 8 (eight) hours as needed for nausea or vomiting. 30 tablet 1   oxybutynin (DITROPAN) 5 MG tablet Take 1 tablet (5 mg total) by mouth every 8 (eight) hours as needed for bladder spasms. 30 tablet 1   polyethylene glycol (MIRALAX / GLYCOLAX) 17 g packet Take 17 g by mouth as needed.     prochlorperazine (COMPAZINE) 10 MG tablet Take 1 tablet (10 mg total) by mouth every 6 (six) hours as needed for nausea or vomiting. 30 tablet 0   traMADol (ULTRAM) 50 MG tablet Take by mouth every 6 (six) hours as needed. Not taking     No current facility-administered medications for this visit.    SURGICAL HISTORY:  Past Surgical History:  Procedure Laterality Date   ABDOMINAL SURGERY     resection of small intestine   ANTERIOR CRUCIATE LIGAMENT REPAIR Bilateral    BACK SURGERY     fusion of lumbar   CARDIAC CATHETERIZATION  06/25/2012   COLON SURGERY     resection for bowel - for obstruction  6 to 7 yrs ago per pt on 01-05-2022   COLONOSCOPY     Hx; of   DILATION AND CURETTAGE OF UTERUS     EYE SURGERY Bilateral    cataracts removed   HERNIA REPAIR  02/23/1969   inguinal hernia    IR IMAGING GUIDED PORT INSERTION  06/07/2022   LEFT HEART CATHETERIZATION WITH CORONARY ANGIOGRAM N/A 03/30/2013   Procedure: LEFT HEART CATHETERIZATION WITH CORONARY ANGIOGRAM;   Surgeon: Marykay Lex, MD;  Location: [facility] CATH LAB;  Service: Cardiovascular;  Laterality: N/A;   left thumb surgery     yrs ago   ROBOT ASSITED LAPAROSCOPIC NEPHROURETERECTOMY Left 03/09/2021   Procedure: XI ROBOT ASSITED LAPAROSCOPIC NEPHROURETERECTOMY/ CYSTOSCOPY WITH LEFT URETEROSCOPY WITH TRANSURETHRAL RESECTION OF URETERAL ORIFICE;  Surgeon: Rene Paci, MD;  Location: [facility];  Service: Urology;  Laterality: Left;   SALIVARY GLAND SURGERY Left    approached from inside mouth &  side of neck 1970-1980   TONSILLECTOMY     as child   TOTAL HIP ARTHROPLASTY Left 05/28/2014   Procedure: LEFT TOTAL HIP ARTHROPLASTY;  Surgeon: Thera Flake., MD;  Location: [facility];  Service: Orthopedics;  Laterality: Left;   TRANSURETHRAL RESECTION OF BLADDER TUMOR N/A 08/23/2021   Procedure: TRANSURETHRAL RESECTION OF BLADDER TUMOR (TURBT) WITH CYSTOSCOPY/ POSTOPERATIVE INSTILLATION OF GEMCITABINE/retrograde pyelogram and stent placement (right);  Surgeon: Rene Paci, MD;  Location: [facility];  Service: Urology;  Laterality: N/A;   TRANSURETHRAL RESECTION OF BLADDER TUMOR N/A 01/10/2022   Procedure: TRANSURETHRAL RESECTION OF BLADDER TUMOR (TURBT) WITH CYSTOSCOPY / GEMCITABINE INSTILLATION post-operatively;  Surgeon: Rene Paci, MD;  Location: [facility];  Service: Urology;  Laterality: N/A;  ONLY NEEDS 30 MIN    REVIEW OF SYSTEMS:  A comprehensive review of systems was negative except for: Constitutional: positive for fatigue Gastrointestinal: positive for constipation Neurological: positive for dizziness   PHYSICAL EXAMINATION: General appearance: alert, cooperative, fatigued, and no distress Head: Normocephalic, without obvious abnormality, atraumatic Neck: no adenopathy, no JVD, supple, symmetrical, trachea midline, and thyroid not enlarged, symmetric, no tenderness/mass/nodules Lymph nodes: Cervical, supraclavicular, and  axillary nodes normal. Resp: clear to auscultation bilaterally Back: symmetric, no curvature. ROM normal. No CVA tenderness. Cardio: regular rate and rhythm, S1, S2 normal, no murmur, click, rub or gallop GI: soft, non-tender; bowel sounds normal; no masses,  no organomegaly Extremities: extremities normal, atraumatic, no cyanosis or edema  ECOG PERFORMANCE STATUS: 1 - Symptomatic but completely ambulatory  Blood pressure (!) 146/78, pulse 80, temperature 97.6 F (36.4 C), temperature source Oral, resp. rate 17, height 5\' 8"  (1.727 m), weight 172 lb 3.2 oz (78.1 kg), SpO2 100%.  LABORATORY DATA: Lab Results  Component Value Date   WBC 8.9 01/10/2023   HGB 14.1 01/10/2023   HCT 41.4 01/10/2023   MCV 101.2 (H) 01/10/2023   PLT 291 01/10/2023      Chemistry      Component Value Date/Time   NA 134 (L) 01/10/2023 1058   K 4.2 01/10/2023 1058   CL 102 01/10/2023 1058   CO2 23 01/10/2023 1058   BUN 28 (H) 01/10/2023 1058   CREATININE 1.82 (H) 01/10/2023 1058      Component Value Date/Time   CALCIUM 9.4 01/10/2023 1058   ALKPHOS 74 01/10/2023 1058   AST 21 01/10/2023 1058   ALT 30 01/10/2023 1058   BILITOT 0.2 (L) 01/10/2023 1058       RADIOGRAPHIC STUDIES: No results found.  ASSESSMENT AND PLAN: This is a very pleasant 72 years old African-American female with metastatic urothelial carcinoma initially diagnosed as a stage II (T2, N0, M0) in September 2022 status post right nephroureterectomy as well as intravesical treatment and resection of superficial tumor of the bladder several times in March 2023 and July 2023. The patient was found to have metastatic disease in November 2023. She underwent systemic chemotherapy with 6 cycles of carboplatin and gemcitabine. She has no evidence for disease progression after cycle #6.  The patient started maintenance treatment with Avelumab 800 Mg IV every 2 weeks status post 5 cycles.   The patient has been tolerating this treatment well  with no concerning adverse effects. I recommended for her to proceed with cycle #6 today as planned. I will arrange for her to have repeat CT scan of the head, chest, abdomen and pelvis for restaging of her disease next week. For the insomnia, she will continue with  melatonin and magnesium glycinate at nighttime. The patient was advised to call immediately if she has any other concerning symptoms in the interval. The patient voices understanding of current disease status and treatment options and is in agreement with the current care plan.  All questions were answered. The patient knows to call the clinic with any problems, questions or concerns. We can certainly see the patient much sooner if necessary.  The total time spent in the appointment was 20 minutes.  Disclaimer: This note was dictated with voice recognition software. Similar sounding words can inadvertently be transcribed and may not be corrected upon review.

## 2023-01-24 NOTE — Progress Notes (Signed)
Patient seen by Dr. Gypsy Balsam are within treatment parameters.  Labs reviewed: CBC reviewed by provider. Awaiting CMP results.  Per physician team, patient is ready for treatment. Patient is okay to proceed pending CMP results.

## 2023-01-24 NOTE — Telephone Encounter (Signed)
Per Dr. Arbutus Ped - okay to proceed with treatment with creatinine of 1.56.

## 2023-01-24 NOTE — Patient Instructions (Signed)
[facility]  Discharge Instructions: Thank you for choosing [facility] to provide your oncology and hematology care.   If you have a lab appointment with the Cancer Center, please go directly to the Cancer Center and check in at the registration area.   Wear comfortable clothing and clothing appropriate for easy access to any Portacath or PICC line.   We strive to give you quality time with your provider. You may need to reschedule your appointment if you arrive late (15 or more minutes).  Arriving late affects you and other patients whose appointments are after yours.  Also, if you miss three or more appointments without notifying the office, you may be dismissed from the clinic at the provider's discretion.      For prescription refill requests, have your pharmacy contact our office and allow 72 hours for refills to be completed.    Today you received the following chemotherapy and/or immunotherapy agents : Bavencio      To help prevent nausea and vomiting after your treatment, we encourage you to take your nausea medication as directed.  BELOW ARE SYMPTOMS THAT SHOULD BE REPORTED IMMEDIATELY: *FEVER GREATER THAN 100.4 F (38 C) OR HIGHER *CHILLS OR SWEATING *NAUSEA AND VOMITING THAT IS NOT CONTROLLED WITH YOUR NAUSEA MEDICATION *UNUSUAL SHORTNESS OF BREATH *UNUSUAL BRUISING OR BLEEDING *URINARY PROBLEMS (pain or burning when urinating, or frequent urination) *BOWEL PROBLEMS (unusual diarrhea, constipation, pain near the anus) TENDERNESS IN MOUTH AND THROAT WITH OR WITHOUT PRESENCE OF ULCERS (sore throat, sores in mouth, or a toothache) UNUSUAL RASH, SWELLING OR PAIN  UNUSUAL VAGINAL DISCHARGE OR ITCHING   Items with * indicate a potential emergency and should be followed up as soon as possible or go to the Emergency Department if any problems should occur.  Please show the CHEMOTHERAPY ALERT CARD or IMMUNOTHERAPY ALERT CARD at  check-in to the Emergency Department and triage nurse.  Should you have questions after your visit or need to cancel or reschedule your appointment, please contact [facility]  Dept: [phone]  and follow the prompts.  Office hours are 8:00 a.m. to 4:30 p.m. Monday - Friday. Please note that voicemails left after 4:00 p.m. may not be returned until the following business day.  We are closed weekends and major holidays. You have access to a nurse at all times for urgent questions. Please call the main number to the clinic Dept: [phone] and follow the prompts.   For any non-urgent questions, you may also contact your provider using MyChart. We now offer e-Visits for anyone 59 and older to request care online for non-urgent symptoms. For details visit [email or web address].   Also download the MyChart app! Go to the app store, search "MyChart", open the app, select [facility], and log in with your MyChart username and password.

## 2023-01-24 NOTE — Progress Notes (Signed)
Nutrition follow-up completed with patient during infusion for metastatic urothelial carcinoma.  She is followed by Dr. Arbutus Ped.  Weight improved and documented as 172 pounds 3.2 ounces August 1.  This is increased from 169 pounds 4.8 ounces July 18.  Labs include sodium 130, glucose 103, BUN 21, creatinine 1.56.  Noted ED visit for mouth abscess on July 21.  Patient reports she feels much better the last 2 days.  She had some nausea and took her nausea medication.  This improved her nausea however it caused constipation.  Patient is feeling much better and is improved.  Currently denies nutrition impact symptoms.  Patient reports she is eating well.  She tries to drink at least 1 carton of Ensure Plus a day.  She continues to have some taste alterations but is using baking soda and salt water gargles.  She has no questions or concerns today.  Nutrition diagnosis: Unintended weight loss improved.  Intervention: Continue to manage side effects with medications per MD. Continue small frequent meals and snacks with adequate calories and protein to minimize weight loss. Continue bowel regimen. Continue Ensure Plus or equivalent 1 carton daily.  Provided samples and coupons.  Monitoring, evaluation, goals: Patient will tolerate adequate calories and protein to minimize weight loss.  Next visit: Thursday, September 12 during infusion.  **Disclaimer: This note was dictated with voice recognition software. Similar sounding words can inadvertently be transcribed and this note may contain transcription errors which may not have been corrected upon publication of note.**

## 2023-01-25 ENCOUNTER — Telehealth: Payer: Self-pay | Admitting: Internal Medicine

## 2023-01-25 NOTE — Telephone Encounter (Signed)
Called patient regarding upcoming August/September appointments, left a voicemail.

## 2023-01-26 ENCOUNTER — Other Ambulatory Visit: Payer: Self-pay

## 2023-01-29 ENCOUNTER — Inpatient Hospital Stay: Payer: Medicare Other | Admitting: Licensed Clinical Social Worker

## 2023-01-29 DIAGNOSIS — C7910 Secondary malignant neoplasm of unspecified urinary organs: Secondary | ICD-10-CM

## 2023-01-29 NOTE — Progress Notes (Signed)
[facility] CSW Progress Note  Visual merchandiser  received a call from pt regarding possible co-payment for upcoming scans.  CSW checked pt's appointment and pt has met her deductible for the year and will  not have a co-payment.  Pt informed and also informed that in future if she has scans or procedures with [facility] she can set up a payment plan if she does not have the funds to pay the full amount of her deductible up front.  Pt also requesting a counseling appointment.  Pt booked w/ CSW on 8/14.        Rachel Moulds, LCSW Clinical Social Worker [facility]

## 2023-01-31 ENCOUNTER — Ambulatory Visit (HOSPITAL_COMMUNITY)
Admission: RE | Admit: 2023-01-31 | Discharge: 2023-01-31 | Disposition: A | Discharge status: Home / Self Care | Payer: Medicare Other | Source: Ambulatory Visit | Attending: Internal Medicine | Admitting: Internal Medicine

## 2023-01-31 DIAGNOSIS — I700 Atherosclerosis of aorta: Secondary | ICD-10-CM | POA: Diagnosis not present

## 2023-01-31 DIAGNOSIS — H814 Vertigo of central origin: Secondary | ICD-10-CM | POA: Diagnosis not present

## 2023-01-31 DIAGNOSIS — J439 Emphysema, unspecified: Secondary | ICD-10-CM | POA: Insufficient documentation

## 2023-01-31 DIAGNOSIS — J432 Centrilobular emphysema: Secondary | ICD-10-CM | POA: Diagnosis not present

## 2023-01-31 DIAGNOSIS — Z905 Acquired absence of kidney: Secondary | ICD-10-CM | POA: Insufficient documentation

## 2023-01-31 DIAGNOSIS — R42 Dizziness and giddiness: Secondary | ICD-10-CM | POA: Diagnosis not present

## 2023-01-31 DIAGNOSIS — C7910 Secondary malignant neoplasm of unspecified urinary organs: Secondary | ICD-10-CM | POA: Diagnosis not present

## 2023-01-31 DIAGNOSIS — R918 Other nonspecific abnormal finding of lung field: Secondary | ICD-10-CM | POA: Diagnosis not present

## 2023-01-31 DIAGNOSIS — C679 Malignant neoplasm of bladder, unspecified: Secondary | ICD-10-CM | POA: Diagnosis not present

## 2023-01-31 DIAGNOSIS — I639 Cerebral infarction, unspecified: Secondary | ICD-10-CM | POA: Diagnosis not present

## 2023-02-01 NOTE — Progress Notes (Signed)
[facility] OFFICE PROGRESS NOTE  [name], MD [address] [place] [address]  DIAGNOSIS: Metastatic urothelial carcinoma that was initially diagnosed as stage II (T2, N0, M0) in September 2022 status post right nephroureterectomy as well as intravesical treatment and resection of superficial tumor in the bladder several times and March 2023 as well as July 2023.  The patient was found to have enlarging and new pulmonary nodules consistent with metastatic disease in November 2023.   PRIOR THERAPY:  1) Right nephroureterectomy as well as intravesical treatment and resection of superficial tumor in the bladder several times and March 2023 as well as July 2023.  2) Palliative systemic chemotherapy with carboplatin for AUC of 5 on day 1 and gemcitabine 1000 mg/M2 on days 1 and 8 every 3 weeks. First cycle June 21, 2022. Status post 6 cycles. Her dose of carboplatin was reduced to AUC of 4 and gemcitabine to 800 mg/m2 starting from cycle #5 due to cytopenias. 3) Maintenance immunotherapy with Avelumab 800 mg IV every 2 weeks. First dose on 11/15/22. Status post 6 cycle of treatment.  CURRENT THERAPY: Erdafitinib (balversa) 8 mg p.o daily. First dose hopefully in the next few days.   INTERVAL HISTORY: Morgan Torres 72 y.o. female returns to the clinic today for a follow-up visit.  The patient was last seen by Dr. Arbutus Ped on 01/24/23. She started immunotherapy and has been tolerating it well. She feels fairly well today. She has been struggling with fatigue and insomnia but states it is getting a little better.  She is followed by the nutritionist team. She states her appetite is getting a little better. She gained some weight since last being seen. For the insomnia, she has tried melatonin and tylenol PM without improvement. She has ativan in her medication list but she does not take this often.   She is supposed to meet with the social worker today for some counseling.      She denies any fever, chills, or unexplained weight loss.  She reports stable night sweats which have been occurring for majority of her life.  She denies any cough, hemoptysis, or chest pain.  She denied any significant dyspnea on exertion at this time. At times, she reports she may get winded but overall her breathing is "good". Denies any nausea, vomiting, or diarrhea.   Denies any blood in the urine.  She denies any abdominal pain. She denies any hematuria. Denies any malodorous urine or cloudy urine. She recently had a restaging CT scan performed. She is here today for evaluation repeat blood work before undergoing cycle #7.    MEDICAL HISTORY: Past Medical History:  Diagnosis Date   Anxiety    Arthritis    hip, lumbar spine    Asthma    seasonal allergies    Bronchitis    Hx: of   Chronic kidney disease    COPD (chronic obstructive pulmonary disease) (HCC)    Coronary artery disease    Depression    Fibromyalgia    Hypertension    Lumbar herniated disc    Myocardial infarction (HCC) 06/25/2012   followed by Dr. Rennis Golden, treated medically, no stents   Peripheral arterial disease (HCC)    of right foot   Pneumonia    Hx: of yrs ago   Sciatica    Sleep apnea    Small bowel obstruction (HCC)    several yrs ago    ALLERGIES:  is allergic to diclofenac,  codeine, hydrocodone, and ciprofloxacin.  MEDICATIONS:  Current Outpatient Medications  Medication Sig Dispense Refill   erdafitinib (BALVERSA) 4 MG tablet Take 2 tablets (8 mg total) by mouth daily. 60 tablet 3   acetaminophen (TYLENOL) 500 MG tablet Take 1 tablet (500 mg total) by mouth every 6 (six) hours as needed for mild pain. 30 tablet 0   albuterol (PROVENTIL HFA;VENTOLIN HFA) 108 (90 Base) MCG/ACT inhaler Inhale 2 puffs into the lungs every 6 (six) hours as needed for wheezing or shortness of breath. 1 Inhaler 0   amLODipine (NORVASC) 5 MG tablet TAKE 1 TABLET BY MOUTH EVERY DAY 15 tablet 0   aspirin EC 81 MG  tablet Take 81 mg by mouth daily. Swallow whole.     atorvastatin (LIPITOR) 20 MG tablet Take 20 mg by mouth. Monday wed and Friday in am     cilostazol (PLETAL) 100 MG tablet Take 100 mg by mouth 2 (two) times daily.     EPINEPHrine (EPIPEN 2-PAK) 0.3 mg/0.3 mL DEVI Inject 0.3 mLs (0.3 mg total) into the muscle once as needed (for severe allergic reaction). CAll 911 immediately if you have to use this medicine 1 Device 1   HYDROcodone-acetaminophen (NORCO/VICODIN) 5-325 MG tablet Take 1 tablet by mouth 3 (three) times daily as needed.     levothyroxine (SYNTHROID) 50 MCG tablet Take 1 tablet (50 mcg total) by mouth daily before breakfast. 30 tablet 3   lidocaine-prilocaine (EMLA) cream Apply to the Port-A-Cath site 30-60-minute before treatment 30 g 2   LORazepam (ATIVAN) 0.5 MG tablet Take 0.5 mg by mouth daily as needed for anxiety.     losartan (COZAAR) 100 MG tablet Take 100 mg by mouth daily.     losartan-hydrochlorothiazide (HYZAAR) 100-12.5 MG tablet Take 1 tablet by mouth daily.     metoprolol tartrate (LOPRESSOR) 25 MG tablet Take 1 tablet (25 mg total) by mouth 2 (two) times daily. Pt must keep upcoming appt in August 2024 with Cardiologist before anymore refills. Thank you Final attempt 60 tablet 1   nicotine (NICODERM CQ - DOSED IN MG/24 HOURS) 21 mg/24hr patch Place 21 mg onto the skin daily.     nicotine polacrilex (NICORETTE) 4 MG gum Take 4 mg by mouth as needed for smoking cessation.     nitroGLYCERIN (NITROSTAT) 0.4 MG SL tablet Place 1 tablet (0.4 mg total) under the tongue every 5 (five) minutes x 3 doses as needed for chest pain. 25 tablet 2   oxybutynin (DITROPAN) 5 MG tablet Take 1 tablet (5 mg total) by mouth every 8 (eight) hours as needed for bladder spasms. 30 tablet 1   polyethylene glycol (MIRALAX / GLYCOLAX) 17 g packet Take 17 g by mouth as needed.     prochlorperazine (COMPAZINE) 10 MG tablet Take 1 tablet (10 mg total) by mouth every 6 (six) hours as needed for  nausea or vomiting. 30 tablet 0   traMADol (ULTRAM) 50 MG tablet Take by mouth every 6 (six) hours as needed. Not taking     No current facility-administered medications for this visit.    SURGICAL HISTORY:  Past Surgical History:  Procedure Laterality Date   ABDOMINAL SURGERY     resection of small intestine   ANTERIOR CRUCIATE LIGAMENT REPAIR Bilateral    BACK SURGERY     fusion of lumbar   CARDIAC CATHETERIZATION  06/25/2012   COLON SURGERY     resection for bowel - for obstruction  6 to 7 yrs ago per pt  on 01-05-2022   COLONOSCOPY     Hx; of   DILATION AND CURETTAGE OF UTERUS     EYE SURGERY Bilateral    cataracts removed   HERNIA REPAIR  02/23/1969   inguinal hernia    IR IMAGING GUIDED PORT INSERTION  06/07/2022   LEFT HEART CATHETERIZATION WITH CORONARY ANGIOGRAM N/A 03/30/2013   Procedure: LEFT HEART CATHETERIZATION WITH CORONARY ANGIOGRAM;  Surgeon: Marykay Lex, MD;  Location: [facility] CATH LAB;  Service: Cardiovascular;  Laterality: N/A;   left thumb surgery     yrs ago   ROBOT ASSITED LAPAROSCOPIC NEPHROURETERECTOMY Left 03/09/2021   Procedure: XI ROBOT ASSITED LAPAROSCOPIC NEPHROURETERECTOMY/ CYSTOSCOPY WITH LEFT URETEROSCOPY WITH TRANSURETHRAL RESECTION OF URETERAL ORIFICE;  Surgeon: Rene Paci, MD;  Location: [facility] ORS;  Service: Urology;  Laterality: Left;   SALIVARY GLAND SURGERY Left    approached from inside mouth & side of neck 1970-1980   TONSILLECTOMY     as child   TOTAL HIP ARTHROPLASTY Left 05/28/2014   Procedure: LEFT TOTAL HIP ARTHROPLASTY;  Surgeon: Thera Flake., MD;  Location: [facility] OR;  Service: Orthopedics;  Laterality: Left;   TRANSURETHRAL RESECTION OF BLADDER TUMOR N/A 08/23/2021   Procedure: TRANSURETHRAL RESECTION OF BLADDER TUMOR (TURBT) WITH CYSTOSCOPY/ POSTOPERATIVE INSTILLATION OF GEMCITABINE/retrograde pyelogram and stent placement (right);  Surgeon: Rene Paci, MD;  Location: [facility];  Service:  Urology;  Laterality: N/A;   TRANSURETHRAL RESECTION OF BLADDER TUMOR N/A 01/10/2022   Procedure: TRANSURETHRAL RESECTION OF BLADDER TUMOR (TURBT) WITH CYSTOSCOPY / GEMCITABINE INSTILLATION post-operatively;  Surgeon: Rene Paci, MD;  Location: [facility];  Service: Urology;  Laterality: N/A;  ONLY NEEDS 30 MIN    REVIEW OF SYSTEMS:   Review of Systems  Constitutional: Positive for improving but persistent fatigue and decreased appetite. Negative for chills, fever and unexpected weight change.  HENT:   Negative for mouth sores, nosebleeds, sore throat and trouble swallowing.   Eyes: Negative for eye problems and icterus.  Respiratory: Negative for cough, hemoptysis, shortness of breath and wheezing.   Cardiovascular: Negative for chest pain and leg swelling.  Gastrointestinal: Positive for occasional constipation. Negative for abdominal pain, diarrhea, nausea and vomiting.  Genitourinary: Negative for bladder incontinence, difficulty urinating, dysuria, frequency and hematuria.   Musculoskeletal: Positive for chronic back pain and right shoulder pain (not discussed today). Negative for gait problem, neck pain and neck stiffness.  Skin: Negative for itching and rash.  Neurological: Negative for dizziness, extremity weakness, gait problem, headaches, light-headedness and seizures.  Hematological: Negative for adenopathy. Does not bruise/bleed easily.  Psychiatric/Behavioral: Negative for confusion, depression and sleep disturbance. The patient is not nervous/anxious.     PHYSICAL EXAMINATION:  Blood pressure (!) 109/41, pulse 72, temperature (!) 97.3 F (36.3 C), temperature source Temporal, resp. rate 15, weight 178 lb 12.8 oz (81.1 kg), SpO2 98%.  ECOG PERFORMANCE STATUS: 2  Physical Exam  Constitutional: Oriented to person, place, and time and well-developed, well-nourished, and in no distress.   HENT:  Head: Normocephalic and atraumatic.  Mouth/Throat:  Oropharynx is clear and moist. No oropharyngeal exudate.  Eyes: Conjunctivae are normal. Right eye exhibits no discharge. Left eye exhibits no discharge. No scleral icterus.  Neck: Normal range of motion. Neck supple.  Cardiovascular: Normal rate, regular rhythm, normal heart sounds and intact distal pulses.   Pulmonary/Chest: Effort normal and breath sounds normal. No respiratory distress. No wheezes. No rales.  Abdominal: Soft. Bowel sounds are normal. Exhibits no distension  and no mass. There is no tenderness.  Musculoskeletal: Normal range of motion. Exhibits no edema.  Lymphadenopathy:    No cervical adenopathy.  Neurological: Alert and oriented to person, place, and time. Exhibits normal muscle tone. Gait normal. Coordination normal.  Skin: Skin is warm and dry. No rash noted. Not diaphoretic. No erythema. No pallor.  Psychiatric: Mood, memory and judgment normal.  Vitals reviewed.  LABORATORY DATA: Lab Results  Component Value Date   WBC 6.5 02/06/2023   HGB 12.1 02/06/2023   HCT 35.5 (L) 02/06/2023   MCV 100.3 (H) 02/06/2023   PLT 277 02/06/2023      Chemistry      Component Value Date/Time   NA 130 (L) 01/24/2023 1308   K 4.5 01/24/2023 1308   CL 99 01/24/2023 1308   CO2 24 01/24/2023 1308   BUN 21 01/24/2023 1308   CREATININE 1.56 (H) 01/24/2023 1308      Component Value Date/Time   CALCIUM 8.9 01/24/2023 1308   ALKPHOS 68 01/24/2023 1308   AST 36 01/24/2023 1308   ALT 47 (H) 01/24/2023 1308   BILITOT 0.3 01/24/2023 1308       RADIOGRAPHIC STUDIES:  CT Chest Wo Contrast  Result Date: 02/05/2023 CLINICAL DATA:  Bladder cancer restaging.  * Tracking Code: BO * EXAM: CT CHEST, ABDOMEN AND PELVIS WITHOUT CONTRAST TECHNIQUE: Multidetector CT imaging of the chest, abdomen and pelvis was performed following the standard protocol without IV contrast. RADIATION DOSE REDUCTION: This exam was performed according to the departmental dose-optimization program which  includes automated exposure control, adjustment of the mA and/or kV according to patient size and/or use of iterative reconstruction technique. COMPARISON:  11/03/2022 FINDINGS: CT CHEST FINDINGS Cardiovascular: The heart size is normal. No substantial pericardial effusion. Coronary artery calcification is evident. Advanced atherosclerotic calcification is noted in the wall of the thoracic aorta. Enlargement of the pulmonary outflow tract/main pulmonary arteries suggests pulmonary arterial hypertension. Right Port-A-Cath tip is positioned in the right atrium. Mediastinum/Nodes: No mediastinal lymphadenopathy. No evidence for gross hilar lymphadenopathy although assessment is limited by the lack of intravenous contrast on the current study. The esophagus has normal imaging features. There is no axillary lymphadenopathy. Lungs/Pleura: Centrilobular and paraseptal emphysema evident. 10 mm left upper lobe nodule on 29/505 was 6 mm previously (remeasured). Superior segment left lower lobe pulmonary nodule measured previously at 14 x 11 mm is 19 x 14 mm today on image 58. 8 mm peripheral right lower lobe nodule on image 108 was 4 mm previously (remeasured) 7 mm right lower lobe nodule on 82/505 was only about 2 mm previously. No focal airspace consolidation. No pulmonary edema or pleural effusion. Musculoskeletal: Small sclerotic lesions in the T7 and T8 vertebral bodies are stable, indeterminate and potentially bone islands. CT ABDOMEN PELVIS FINDINGS Hepatobiliary: No suspicious focal abnormality within the liver parenchyma. Scattered tiny hypodensities in the liver parenchyma are too small to characterize but are statistically most likely benign. No followup imaging is recommended. There is no evidence for gallstones, gallbladder wall thickening, or pericholecystic fluid. No intrahepatic or extrahepatic biliary dilation. Pancreas: 11 mm hypodensity seen in the region of the posterior pancreatic head (image 66/508) was  not clearly visible on the most recent comparison but with slightly more prominent on the study from 08/20/2022 with features suggesting duodenal diverticulum on that exam. No main duct dilatation in the pancreas. Spleen: No splenomegaly. No suspicious focal mass lesion. Adrenals/Urinary Tract: No adrenal nodule or mass. Left kidney surgically absent. No new  or suspicious soft tissue in the rectum E bed. Right kidney and ureter unremarkable. The urinary bladder appears normal for the degree of distention. Stomach/Bowel: Stomach is unremarkable. No gastric wall thickening. No evidence of outlet obstruction. Duodenum is normally positioned as is the ligament of Treitz. No small bowel wall thickening. No small bowel dilatation. The terminal ileum is normal. No gross colonic mass. No colonic wall thickening. Diverticular changes are noted in the left colon without evidence of diverticulitis. Vascular/Lymphatic: There is moderate atherosclerotic calcification of the abdominal aorta without aneurysm. There is no gastrohepatic or hepatoduodenal ligament lymphadenopathy. No retroperitoneal or mesenteric lymphadenopathy. No pelvic sidewall lymphadenopathy. Reproductive: Hysterectomy.  There is no adnexal mass. Other: No intraperitoneal free fluid. Musculoskeletal: Left hip replacement. No worrisome lytic or sclerotic osseous abnormality. Lumbosacral fusion hardware again noted. IMPRESSION: 1. Interval enlargement of bilateral pulmonary nodules, consistent with progression of metastatic disease. 2. 11 mm hypodensity in the region of the posterior pancreatic head was not clearly visible on the most recent comparison but was slightly more prominent on the study from 08/20/2022 with features suggesting duodenal diverticulum on that exam. Attention on follow-up recommended. 3. Status post left nephrectomy. No new or suspicious soft tissue in the nephrectomy bed. 4. Enlargement of the pulmonary outflow tract suggesting pulmonary  arterial hypertension. 5. Aortic Atherosclerosis (ICD10-I70.0) and Emphysema (ICD10-J43.9). Electronically Signed   By: Kennith Center M.D.   On: 02/05/2023 04:48   CT ABDOMEN PELVIS WO CONTRAST  Result Date: 02/05/2023 CLINICAL DATA:  Bladder cancer restaging.  * Tracking Code: BO * EXAM: CT CHEST, ABDOMEN AND PELVIS WITHOUT CONTRAST TECHNIQUE: Multidetector CT imaging of the chest, abdomen and pelvis was performed following the standard protocol without IV contrast. RADIATION DOSE REDUCTION: This exam was performed according to the departmental dose-optimization program which includes automated exposure control, adjustment of the mA and/or kV according to patient size and/or use of iterative reconstruction technique. COMPARISON:  11/03/2022 FINDINGS: CT CHEST FINDINGS Cardiovascular: The heart size is normal. No substantial pericardial effusion. Coronary artery calcification is evident. Advanced atherosclerotic calcification is noted in the wall of the thoracic aorta. Enlargement of the pulmonary outflow tract/main pulmonary arteries suggests pulmonary arterial hypertension. Right Port-A-Cath tip is positioned in the right atrium. Mediastinum/Nodes: No mediastinal lymphadenopathy. No evidence for gross hilar lymphadenopathy although assessment is limited by the lack of intravenous contrast on the current study. The esophagus has normal imaging features. There is no axillary lymphadenopathy. Lungs/Pleura: Centrilobular and paraseptal emphysema evident. 10 mm left upper lobe nodule on 29/505 was 6 mm previously (remeasured). Superior segment left lower lobe pulmonary nodule measured previously at 14 x 11 mm is 19 x 14 mm today on image 58. 8 mm peripheral right lower lobe nodule on image 108 was 4 mm previously (remeasured) 7 mm right lower lobe nodule on 82/505 was only about 2 mm previously. No focal airspace consolidation. No pulmonary edema or pleural effusion. Musculoskeletal: Small sclerotic lesions in the  T7 and T8 vertebral bodies are stable, indeterminate and potentially bone islands. CT ABDOMEN PELVIS FINDINGS Hepatobiliary: No suspicious focal abnormality within the liver parenchyma. Scattered tiny hypodensities in the liver parenchyma are too small to characterize but are statistically most likely benign. No followup imaging is recommended. There is no evidence for gallstones, gallbladder wall thickening, or pericholecystic fluid. No intrahepatic or extrahepatic biliary dilation. Pancreas: 11 mm hypodensity seen in the region of the posterior pancreatic head (image 66/508) was not clearly visible on the most recent comparison but with slightly more  prominent on the study from 08/20/2022 with features suggesting duodenal diverticulum on that exam. No main duct dilatation in the pancreas. Spleen: No splenomegaly. No suspicious focal mass lesion. Adrenals/Urinary Tract: No adrenal nodule or mass. Left kidney surgically absent. No new or suspicious soft tissue in the rectum E bed. Right kidney and ureter unremarkable. The urinary bladder appears normal for the degree of distention. Stomach/Bowel: Stomach is unremarkable. No gastric wall thickening. No evidence of outlet obstruction. Duodenum is normally positioned as is the ligament of Treitz. No small bowel wall thickening. No small bowel dilatation. The terminal ileum is normal. No gross colonic mass. No colonic wall thickening. Diverticular changes are noted in the left colon without evidence of diverticulitis. Vascular/Lymphatic: There is moderate atherosclerotic calcification of the abdominal aorta without aneurysm. There is no gastrohepatic or hepatoduodenal ligament lymphadenopathy. No retroperitoneal or mesenteric lymphadenopathy. No pelvic sidewall lymphadenopathy. Reproductive: Hysterectomy.  There is no adnexal mass. Other: No intraperitoneal free fluid. Musculoskeletal: Left hip replacement. No worrisome lytic or sclerotic osseous abnormality.  Lumbosacral fusion hardware again noted. IMPRESSION: 1. Interval enlargement of bilateral pulmonary nodules, consistent with progression of metastatic disease. 2. 11 mm hypodensity in the region of the posterior pancreatic head was not clearly visible on the most recent comparison but was slightly more prominent on the study from 08/20/2022 with features suggesting duodenal diverticulum on that exam. Attention on follow-up recommended. 3. Status post left nephrectomy. No new or suspicious soft tissue in the nephrectomy bed. 4. Enlargement of the pulmonary outflow tract suggesting pulmonary arterial hypertension. 5. Aortic Atherosclerosis (ICD10-I70.0) and Emphysema (ICD10-J43.9). Electronically Signed   By: Kennith Center M.D.   On: 02/05/2023 04:48     ASSESSMENT/PLAN:  This is a very pleasant 72 year old African-American female with metastatic urothelial carcinoma.  She was initially diagnosed as a stage II (T2, N0, M0) in September 2022.   She is status post right nephroureterectomy as well as  intravesical treatment and resection of superficial tumor in the bladder several times and March 2023 as well as July 2023.  The patient was found to have enlarging and new pulmonary nodules consistent with metastatic disease in November 2023.   Dr. Arbutus Ped sent her tissue for foundation 1 and PD-L1 expression. She does not have any actionable mutations  She completed 6 cycles of systemic chemotherapy with carboplatin for an AUC of 5 on day 1 and gemcitabine 1000 milligrams per meter squared on days 1 and 8 IV every 3 weeks with Neulasta support.  Her dose was reduced to carboplatin for an AUC of 4 and gemcitabine 800 mg/m due to cytopenias transfusion support.   Dr. Arbutus Ped recommended maintenance immunotherapy with Avelumab 2 mg IV every 2 weeks.  She is status post 6 cycles of treatment and she tolerated it well without any concerning adverse side.   The patient was seen with Dr. Arbutus Ped today.  Dr.  Arbutus Ped personally and independently reviewed the scan and discussed results with the patient today.  The scan showed interval enlargement of bilateral pulmonary nodules consistent with progressive metastatic disease.  Dr. Arbutus Ped recommends discontinuing current treatment.  Due to her F GFR mutation, she is a candidate for targeted treatment with Balversa 8 milligrams p.o. daily.  This patient is interested in this option and she is expected to undergo her first dose of treatment in the next few days.  She had formal education by the oral chemotherapy pharmacist today.  No dose adjustment is needed for her elevated creatinine at 1.89 today.  She is advised to hydrate well at home.  Patient understands that she needs baseline eye exam prior to starting her treatment.  Then she will require monthly eye exams for 4 months followed by every 3 months thereon after.  We will arrange for her to have a baseline phosphate drawn today.  We will monitor her phosphate while on treatment.  We will see her back for follow-up visit in 3 weeks for evaluation and repeat blood work and to manage any adverse side effects of treatment.  She will meet with the social work team today for her counseling session.  The patient reportedly does not take Ativan frequently.  For her insomnia we will send her prescription for Restoril.  The patient knows not to take Restoril and Ativan.   he patient was advised to call immediately if she has any concerning symptoms in the interval. The patient voices understanding of current disease status and treatment options and is in agreement with the current care plan. All questions were answered. The patient knows to call the clinic with any problems, questions or concerns. We can certainly see the patient much sooner if necessary   Orders Placed This Encounter  Procedures   CBC with Differential (Cancer Center Only)    Standing Status:   Standing    Number of Occurrences:   10     Standing Expiration Date:   02/06/2024   CMP (Cancer Center only)    Standing Status:   Standing    Number of Occurrences:   10    Standing Expiration Date:   02/06/2024   Phosphorus    Standing Status:   Standing    Number of Occurrences:   10    Standing Expiration Date:   02/06/2024    Ellieanna Funderburg L Amadu Schlageter, PA-C 02/06/23  ADDENDUM: Hematology/Oncology Attending: I had a face-to-face encounter with the patient today.  I reviewed her records, lab, scan and recommended her care plan.  This is a very pleasant 72 years old African-American female with metastatic urothelial carcinoma initially diagnosed in September 2022 as a stage II status post right nephro ureterectomy as well as intravesical treatment and resection of superficial tumor in the bladder for several times.  The patient was found to have evidence of metastatic disease in the lung in November 2023.  She underwent palliative systemic chemotherapy with carboplatin and gemcitabine status post 6 cycles with stable disease followed by maintenance treatment with immunotherapy with Avelumab 800 Mg IV every 2 weeks status post 6 cycles.  This treatment was discontinued secondary to disease progression. The patient had repeat CT scan of the chest, abdomen and pelvis performed recently.  I personally and independently reviewed the scan and discussed the result with the patient today.  Unfortunately her scan showed evidence for disease progression with enlargement of bilateral pulmonary nodules. Her molecular studies showed positive FGFR mutation and the patient will be a good candidate for treatment with erdafitinib starting at a dose of 8 mg p.o. daily but we will adjust her dose if needed in the future. I discussed with the patient the adverse effect of this treatment and she will meet with the pharmacist for oral oncolytic for education and also to help her with a refill of her medication. We will see her back for follow-up visit in 3 weeks  for evaluation and repeat blood work for close monitoring of her condition. She was advised to call immediately if she has any other concerning symptoms in the interval. The  total time spent in the appointment was 30 minutes. Disclaimer: This note was dictated with voice recognition software. Similar sounding words can inadvertently be transcribed and may be missed upon review. Lajuana Matte, MD

## 2023-02-03 ENCOUNTER — Other Ambulatory Visit: Payer: Self-pay

## 2023-02-06 ENCOUNTER — Inpatient Hospital Stay: Payer: Medicare Other | Admitting: Licensed Clinical Social Worker

## 2023-02-06 ENCOUNTER — Telehealth: Payer: Self-pay | Admitting: Pharmacist

## 2023-02-06 ENCOUNTER — Other Ambulatory Visit: Payer: Self-pay | Admitting: *Deleted

## 2023-02-06 ENCOUNTER — Inpatient Hospital Stay: Payer: Medicare Other

## 2023-02-06 ENCOUNTER — Inpatient Hospital Stay: Payer: Medicare Other | Admitting: Physician Assistant

## 2023-02-06 ENCOUNTER — Telehealth: Payer: Self-pay | Admitting: Pharmacy Technician

## 2023-02-06 ENCOUNTER — Other Ambulatory Visit (HOSPITAL_COMMUNITY): Payer: Self-pay

## 2023-02-06 ENCOUNTER — Other Ambulatory Visit: Payer: Self-pay

## 2023-02-06 VITALS — BP 109/41 | HR 72 | Temp 97.3°F | Resp 15 | Wt 178.8 lb

## 2023-02-06 DIAGNOSIS — I700 Atherosclerosis of aorta: Secondary | ICD-10-CM | POA: Diagnosis not present

## 2023-02-06 DIAGNOSIS — I252 Old myocardial infarction: Secondary | ICD-10-CM | POA: Diagnosis not present

## 2023-02-06 DIAGNOSIS — K5900 Constipation, unspecified: Secondary | ICD-10-CM | POA: Diagnosis not present

## 2023-02-06 DIAGNOSIS — Z95828 Presence of other vascular implants and grafts: Secondary | ICD-10-CM

## 2023-02-06 DIAGNOSIS — Z79899 Other long term (current) drug therapy: Secondary | ICD-10-CM | POA: Diagnosis not present

## 2023-02-06 DIAGNOSIS — Z905 Acquired absence of kidney: Secondary | ICD-10-CM | POA: Diagnosis not present

## 2023-02-06 DIAGNOSIS — G4700 Insomnia, unspecified: Secondary | ICD-10-CM | POA: Diagnosis not present

## 2023-02-06 DIAGNOSIS — C7910 Secondary malignant neoplasm of unspecified urinary organs: Secondary | ICD-10-CM

## 2023-02-06 DIAGNOSIS — C679 Malignant neoplasm of bladder, unspecified: Secondary | ICD-10-CM | POA: Diagnosis not present

## 2023-02-06 DIAGNOSIS — Z7189 Other specified counseling: Secondary | ICD-10-CM | POA: Diagnosis not present

## 2023-02-06 DIAGNOSIS — D649 Anemia, unspecified: Secondary | ICD-10-CM

## 2023-02-06 DIAGNOSIS — J432 Centrilobular emphysema: Secondary | ICD-10-CM | POA: Diagnosis not present

## 2023-02-06 DIAGNOSIS — C7800 Secondary malignant neoplasm of unspecified lung: Secondary | ICD-10-CM | POA: Diagnosis not present

## 2023-02-06 DIAGNOSIS — Z881 Allergy status to other antibiotic agents status: Secondary | ICD-10-CM | POA: Diagnosis not present

## 2023-02-06 DIAGNOSIS — Z885 Allergy status to narcotic agent status: Secondary | ICD-10-CM | POA: Diagnosis not present

## 2023-02-06 DIAGNOSIS — R61 Generalized hyperhidrosis: Secondary | ICD-10-CM | POA: Diagnosis not present

## 2023-02-06 DIAGNOSIS — I10 Essential (primary) hypertension: Secondary | ICD-10-CM | POA: Diagnosis not present

## 2023-02-06 LAB — CBC WITH DIFFERENTIAL (CANCER CENTER ONLY)
Abs Immature Granulocytes: 0.03 10*3/uL (ref 0.00–0.07)
Basophils Absolute: 0 10*3/uL (ref 0.0–0.1)
Basophils Relative: 1 %
Eosinophils Absolute: 0.1 10*3/uL (ref 0.0–0.5)
Eosinophils Relative: 2 %
HCT: 35.5 % — ABNORMAL LOW (ref 36.0–46.0)
Hemoglobin: 12.1 g/dL (ref 12.0–15.0)
Immature Granulocytes: 1 %
Lymphocytes Relative: 37 %
Lymphs Abs: 2.4 10*3/uL (ref 0.7–4.0)
MCH: 34.2 pg — ABNORMAL HIGH (ref 26.0–34.0)
MCHC: 34.1 g/dL (ref 30.0–36.0)
MCV: 100.3 fL — ABNORMAL HIGH (ref 80.0–100.0)
Monocytes Absolute: 0.5 10*3/uL (ref 0.1–1.0)
Monocytes Relative: 8 %
Neutro Abs: 3.4 10*3/uL (ref 1.7–7.7)
Neutrophils Relative %: 51 %
Platelet Count: 277 10*3/uL (ref 150–400)
RBC: 3.54 MIL/uL — ABNORMAL LOW (ref 3.87–5.11)
RDW: 13.9 % (ref 11.5–15.5)
WBC Count: 6.5 10*3/uL (ref 4.0–10.5)
nRBC: 0 % (ref 0.0–0.2)

## 2023-02-06 LAB — TYPE AND SCREEN
ABO/RH(D): B NEG
Antibody Screen: NEGATIVE

## 2023-02-06 LAB — SAMPLE TO BLOOD BANK

## 2023-02-06 LAB — TSH: TSH: 138.182 u[IU]/mL — ABNORMAL HIGH (ref 0.350–4.500)

## 2023-02-06 LAB — CMP (CANCER CENTER ONLY)
ALT: 33 U/L (ref 0–44)
AST: 27 U/L (ref 15–41)
Albumin: 3.6 g/dL (ref 3.5–5.0)
Alkaline Phosphatase: 66 U/L (ref 38–126)
Anion gap: 3 — ABNORMAL LOW (ref 5–15)
BUN: 32 mg/dL — ABNORMAL HIGH (ref 8–23)
CO2: 27 mmol/L (ref 22–32)
Calcium: 8.8 mg/dL — ABNORMAL LOW (ref 8.9–10.3)
Chloride: 103 mmol/L (ref 98–111)
Creatinine: 1.89 mg/dL — ABNORMAL HIGH (ref 0.44–1.00)
GFR, Estimated: 28 mL/min — ABNORMAL LOW (ref >60)
Glucose, Bld: 119 mg/dL — ABNORMAL HIGH (ref 70–99)
Potassium: 4.4 mmol/L (ref 3.5–5.1)
Sodium: 133 mmol/L — ABNORMAL LOW (ref 135–145)
Total Bilirubin: 0.3 mg/dL (ref 0.3–1.2)
Total Protein: 6.2 g/dL — ABNORMAL LOW (ref 6.5–8.1)

## 2023-02-06 LAB — PHOSPHORUS: Phosphorus: 3.4 mg/dL (ref 2.5–4.6)

## 2023-02-06 MED ORDER — SODIUM CHLORIDE 0.9% FLUSH
10.0000 mL | Freq: Once | INTRAVENOUS | Status: AC
  Administered 2023-02-06: 10 mL

## 2023-02-06 MED ORDER — TEMAZEPAM 15 MG PO CAPS
15.0000 mg | ORAL_CAPSULE | Freq: Every evening | ORAL | 0 refills | Status: AC | PRN
Start: 2023-02-06 — End: ?
  Filled 2023-02-06: qty 30, 30d supply, fill #0

## 2023-02-06 MED ORDER — ERDAFITINIB 4 MG PO TABS
8.0000 mg | ORAL_TABLET | Freq: Every day | ORAL | 3 refills | Status: DC
  Filled 2023-02-06: qty 60, 30d supply, fill #0

## 2023-02-06 NOTE — Telephone Encounter (Signed)
Oral Oncology Patient Advocate Encounter   Received notification that prior authorization for Morgan Torres is required.   PA submitted on 02/06/23 Key [id number] Status is pending     Jinger Neighbors, CPhT-Adv Oncology Pharmacy Patient Advocate [facility] Direct Number: [phone]  Fax: [phone]

## 2023-02-06 NOTE — Progress Notes (Signed)
[facility] CSW Progress Note  Visual merchandiser met with patient to collect the bill to be submitted on September 1st for the [employer].  The grant application will be submitted on behalf of Morgan Torres.  Morgan Torres also experiencing increased insomnia and depression recently.  Per Morgan Torres her treatment will be changing to an oral treatment only which she is happy about as it will improve her quality of life to not need to come to cancer center.  Morgan Torres states she has had increased anxiety while awaiting scan results which may be partially contributing to her insomnia.  Medical team sent  a prescription to the pharmacy for the Morgan Torres for a sleep aide.  Emotional support provided.  CSW to remain available for Morgan Torres as appropriate throughout duration of treatment.      Rachel Moulds, LCSW Clinical Social Worker [facility]

## 2023-02-06 NOTE — Telephone Encounter (Signed)
Oral Oncology Patient Advocate Encounter  Prior Authorization for Mervyn Gay has been approved.    PA# [id number] Effective dates: 02/06/23 through 06/25/23  Patients co-pay is $3,239.98.    Jinger Neighbors, CPhT-Adv Oncology Pharmacy Patient Advocate [facility] Direct Number: [phone]  Fax: [phone]

## 2023-02-06 NOTE — Progress Notes (Signed)
DISCONTINUE ON PATHWAY REGIMEN - Bladder     A cycle is every 14 days:     Avelumab   **Always confirm dose/schedule in your pharmacy ordering system**  REASON: Disease Progression PRIOR TREATMENT: [id number]: Avelumab 800 mg q14 Days Until Progression or Unacceptable Toxicity TREATMENT RESPONSE: Progressive Disease (PD)  START ON PATHWAY REGIMEN - Bladder     A cycle is every 28 days:     Erdafitinib   **Always confirm dose/schedule in your pharmacy ordering system**  Patient Characteristics: Advanced/Metastatic Disease, Second Line, FGFR3 Alteration Present, Prior/Ineligible for PD-1/PD-L1 Inhibitor Therapeutic Status: Advanced/Metastatic Disease Line of Therapy: Second Line FGFR3 Alteration Status: Present Intent of Therapy: Non-Curative / Palliative Intent, Discussed with Patient

## 2023-02-06 NOTE — Telephone Encounter (Signed)
Oral Oncology Patient Advocate Encounter   Began application for assistance for [place] through [place].   Application will be submitted upon completion of necessary supporting documentation.   [facility] phone number [phone].   I will continue to check the status until final determination.   Jinger Neighbors, CPhT-Adv Oncology Pharmacy Patient Advocate [facility] Direct Number: [phone]  Fax: [phone]

## 2023-02-06 NOTE — Patient Instructions (Addendum)
Lurena Joiner the pharmacist will meet with you today to discuss your new treatment. This is a pill that you take daily. This is a special medication so this has to come from special pharmacies.  -Please get an eye exam as soon as possible to get a baseline. Then you will need eye exams monthly for 4 months, then every 3 months there on after.  -We will see you back in 3 weeks for repeat blood work. Implanted Port Insertion, Care After The following information offers guidance on how to care for yourself after your procedure. Your health care provider may also give you more specific instructions. If you have problems or questions, contact your health care provider. What can I expect after the procedure? After the procedure, it is common to have: Discomfort at the port insertion site. Bruising on the skin over the port. This should improve over 3-4 days. Follow these instructions at home: [facility] care After your port is placed, you will get a manufacturer's information card. The card has information about your port. Keep this card with you at all times. Take care of the port as told by your health care provider. Ask your health care provider if you or a family member can get training for taking care of the port at home. A home health care nurse will be be available to help care for the port. Make sure to remember what type of port you have. Incision care     Follow instructions from your health care provider about how to take care of your port insertion site. Make sure you: Wash your hands with soap and water for at least 20 seconds before and after you change your bandage (dressing). If soap and water are not available, use hand sanitizer. Change your dressing as told by your health care provider. Leave stitches (sutures), skin glue, or adhesive strips in place. These skin closures may need to stay in place for 2 weeks or longer. If adhesive strip edges start to loosen and curl up, you may trim the loose  edges. Do not remove adhesive strips completely unless your health care provider tells you to do that. Check your port insertion site every day for signs of infection. Check for: Redness, swelling, or pain. Fluid or blood. Warmth. Pus or a bad smell. Activity Return to your normal activities as told by your health care provider. Ask your health care provider what activities are safe for you. You may have to avoid lifting. Ask your health care provider how much you can safely lift. General instructions Take over-the-counter and prescription medicines only as told by your health care provider. Do not take baths, swim, or use a hot tub until your health care provider approves. Ask your health care provider if you may take showers. You may only be allowed to take sponge baths. If you were given a sedative during the procedure, it can affect you for several hours. Do not drive or operate machinery until your health care provider says that it is safe. Wear a medical alert bracelet in case of an emergency. This will tell any health care providers that you have a port. Keep all follow-up visits. This is important. Contact a health care provider if: You cannot flush your port with saline as directed, or you cannot draw blood from the port. You have a fever or chills. You have redness, swelling, or pain around your port insertion site. You have fluid or blood coming from your port insertion site. Your port  insertion site feels warm to the touch. You have pus or a bad smell coming from the port insertion site. Get help right away if: You have chest pain or shortness of breath. You have bleeding from your port that you cannot control. These symptoms may be an emergency. Get help right away. Call 911. Do not wait to see if the symptoms will go away. Do not drive yourself to the hospital. Summary Take care of the port as told by your health care provider. Keep the manufacturer's information card with  you at all times. Change your dressing as told by your health care provider. Contact a health care provider if you have a fever or chills or if you have redness, swelling, or pain around your port insertion site. Keep all follow-up visits. This information is not intended to replace advice given to you by your health care provider. Make sure you discuss any questions you have with your health care provider. Document Revised: 12/13/2020 Document Reviewed: 12/13/2020 Elsevier Patient Education  2024 [employer].

## 2023-02-06 NOTE — Telephone Encounter (Signed)
Oral Chemotherapy Pharmacist Encounter  Start date pending manufacturer assistance approval and drug acquisition. Will update encounter with start date.   Patient Education I spoke with patient for overview of new oral chemotherapy medication: Balversa (erdafitinib) for the treatment of metastatic urothelial carcinoma, FGFR3 mutated, planned duration until disease progression or unacceptable drug toxicity.  Pt is doing well. Counseled patient on administration, dosing, side effects, monitoring, drug-food interactions, safe handling, storage, and disposal.  CBC w/ Diff and CMP from 02/06/23 assessed - patient with Scr of 1.84 mg/dL (CrCl ~69.6 mL/min) - no baseline renal dose adjustments required for Balversa. Patient baseline phosphorous 3.4 mg/dL. Patient knows she will need a baseline eye exam prior to starting Balversa. She states the will call her eye doctor today to make appt. Prescription dose and frequency assessed for appropriateness.  Patient will take Balversa 4 mg tablet, 2 tablets (8 mg total) by mouth daily. If patient has serum phosphate < 9 mg/dL at 78-93 days after starting therapy, she would be eligible for dose escalation to 9 mg daily at that time.   Patient knows to avoid grapefruit and grapefruit juice while on Balversa.   Side effects include but not limited to: diarrhea, rash, hand-foot syndrome, hyperphosphatemia, mucositis, dry mouth/taste changes, eye changes (blurred vision, dry eyes, retinopathy). LFT increases. Hand-foot syndrome: discussed use of cream such as [place]der[place] Smooth Extra Care 20 or equivalent advanced care cream that has 20% urea content for advanced skin hydration while on Balversa Diarrhea: Patient will obtain Imodium (loperamide) to have on hand if they experience diarrhea. Patient knows to alert the office of 4 or more loose stools above baseline. Eye changes: patient is aware she will need eye exams monthly for the rist 4 months of treatment and then  every 3 months there after and if she ever has any visual changes. Recommend patient utilize lubricating eye drops as well while on therapy to help with dry eye sx.  Hyperphosphatemia: patient will have baseline phosphate obtained prior to starting therapy and then regularly thereafter. Per discussion she does not currently eat a diet high in phosphate (occasionally has red meat). She will keep her diet consistent at this time.   Reviewed with patient importance of keeping a medication schedule and plan for any missed doses.  Current medication list in Epic reviewed, no relevant/significant DDIs with Balversa identified.  After discussion with patient no patient barriers to medication adherence identified. Patient agreement for treatment documented in MD note on 02/06/23.  Morgan Torres voiced understanding and appreciation. All questions answered. Medication handout provided.  Provided patient with Oral Chemotherapy Navigation Clinic phone number. Patient knows to call the office with questions or concerns.  Lenord Carbo, PharmD, BCPS, [facility] Hematology/Oncology Clinical Pharmacist [facility] and [facility] Oral Chemotherapy Navigation Clinics [phone] 02/06/2023 1:49 PM

## 2023-02-07 ENCOUNTER — Other Ambulatory Visit: Payer: Self-pay | Admitting: Physician Assistant

## 2023-02-07 ENCOUNTER — Other Ambulatory Visit (HOSPITAL_COMMUNITY): Payer: Self-pay

## 2023-02-07 DIAGNOSIS — E039 Hypothyroidism, unspecified: Secondary | ICD-10-CM

## 2023-02-07 LAB — T4: T4, Total: 0.8 ug/dL — ABNORMAL LOW (ref 4.5–12.0)

## 2023-02-07 MED ORDER — LEVOTHYROXINE SODIUM 88 MCG PO TABS
88.0000 ug | ORAL_TABLET | Freq: Every day | ORAL | 2 refills | Status: DC
Start: 2023-02-07 — End: 2023-02-08

## 2023-02-07 NOTE — Telephone Encounter (Signed)
Oral Oncology Patient Advocate Encounter   Submitted application for assistance for [facility] to [employer].   Application submitted via e-fax to [phone]   [facility] phone number [phone].   I will continue to check the status until final determination.   Jinger Neighbors, CPhT-Adv Oncology Pharmacy Patient Advocate [facility] Direct Number: [phone]  Fax: [phone]

## 2023-02-08 ENCOUNTER — Telehealth: Payer: Self-pay | Admitting: Physician Assistant

## 2023-02-08 ENCOUNTER — Other Ambulatory Visit: Payer: Self-pay | Admitting: Physician Assistant

## 2023-02-08 DIAGNOSIS — E039 Hypothyroidism, unspecified: Secondary | ICD-10-CM

## 2023-02-08 NOTE — Telephone Encounter (Signed)
I called the patient to let her know that her thyroid continue to show that it is very underactive.  I have increased her dose of Synthroid to 88 mcg.  The patient has written this prescription down and she will pick up the new prescription at her pharmacy.  I encouraged her to please ensure that she is not duplicating her Synthroid and that she get rid of her 50 mcg prescription of Synthroid.  She expressed understanding with the instructions.

## 2023-02-08 NOTE — Progress Notes (Deleted)
Cardiology Clinic Note   Date: 02/08/2023 ID: [name], [name] 05-31-1951, MRN [id number]  Primary Cardiologist:  Chrystie Nose, MD  Patient Profile    Morgan Torres is a 72 y.o. female who presents to the clinic today for ***    Past medical history significant for: Nonobstructive CAD. LHC 03/30/2013 (NSTEMI): Distal LM 10 to 20%.  LAD with diffuse 10 to 20% stenosis.  Ostial AV groove 50%.  Proximal and mid RCA 10 to 20%.  Mild to moderate CAD with no obvious culprit lesion. Nuclear stress test 01/31/2021: Normal, low risk study with no ischemia or infarction. Nonischemic cardiomyopathy. Echo 02/02/2021: EF 60 to 65%.  Mild LVH.  Normal diastolic parameters.  Normal RV function.  Trivial MR.  Mild to moderate aortic valve sclerosis/calcification without stenosis. PAD. ABIs 04/03/2022: Moderate right lower extremity arterial disease.  Normal left lower extremity. Hypertension. Hyperlipidemia. OSA. Bladder cancer with mets to lungs.     History of Present Illness    Morgan Torres was first evaluated by Dr. Rennis Golden on 03/29/2013 during hospital admission for NSTEMI.  She continues to be followed for the above outlined history.  In summary, she was admitted to the hospital on 03/29/2013 for unstable angina and ruled in for NSTEMI.  LHC revealed mild to moderate CAD with no obvious culprit lesion.  She had an abnormal LV gram with WMA suggestive of Takotsubo cardiomyopathy.  Echo revealed EF of 55% with no obvious WMA.  She was treated medically.   Patient was last seen in the office by Dr. Bjorn Pippin on 01/26/2021 for preoperative risk assessment prior to kidney mass resection.  Patient reported DOE.  Nuclear stress test was a normal low risk study..  Echo revealed normal LV/RV function, mild LVH, aortic valve sclerosis/calcification without stenosis.  Today, patient ***  Nonobstructive CAD.  LHC October 2014 showed mild to moderate CAD.  Nuclear stress test August 2022 was a normal low  risk study. Patient *** Continue aspirin, amlodipine, metoprolol, as needed SL NTG. Nonischemic cardiomyopathy.  Echo August 2022 showed normal LV/RV function, mild LVH, mild to moderate aortic valve sclerosis/calcification without stenosis. Patient *** Euvolemic and well compensated on exam. Continue Sartain, metoprolol. Hypertension: BP today *** Patient denies headaches, dizziness or vision changes. Continue amlodipine, metoprolol, losartan. Hyperlipidemia.*** PAD.  ABIs October 2023 showed moderate right lower extremity arterial disease and normal left lower extremity.  Patient***.  Continue aspirin and Pletal.  Followed by vascular surgery.  Scheduled for repeat ABIs 02/13/2023.   ROS: All other systems reviewed and are otherwise negative except as noted in History of Present Illness.  Studies Reviewed       ***  Risk Assessment/Calculations    {Does this patient have ATRIAL FIBRILLATION?:[phone]} No BP recorded.  {Refresh Note OR Click here to enter BP  :1}***        Physical Exam    VS:  There were no vitals taken for this visit. , BMI There is no height or weight on file to calculate BMI.  GEN: Well nourished, well developed, in no acute distress. Neck: No JVD or carotid bruits. Cardiac: *** RRR. No murmurs. No rubs or gallops.   Respiratory:  Respirations regular and unlabored. Clear to auscultation without rales, wheezing or rhonchi. GI: Soft, nontender, nondistended. Extremities: Radials/DP/PT 2+ and equal bilaterally. No clubbing or cyanosis. No edema ***  Skin: Warm and dry, no rash. Neuro: Strength intact.  Assessment & Plan   ***  Disposition: ***     {  Are you ordering a CV Procedure (e.g. stress test, cath, DCCV, TEE, etc)?   Press F2        :[id number]}   Signed, Etta Grandchild. Darshan Solanki, DNP, NP-C

## 2023-02-11 ENCOUNTER — Telehealth: Payer: Self-pay | Admitting: Medical Oncology

## 2023-02-11 ENCOUNTER — Inpatient Hospital Stay: Payer: Medicare Other | Admitting: Licensed Clinical Social Worker

## 2023-02-11 DIAGNOSIS — C7910 Secondary malignant neoplasm of unspecified urinary organs: Secondary | ICD-10-CM

## 2023-02-11 NOTE — Telephone Encounter (Signed)
Medication application done. Pt will call when she starts erdafitinib

## 2023-02-11 NOTE — Progress Notes (Signed)
[facility] CSW Progress Note  Visual merchandiser  received a call from pt regarding an application that was being sent in on her behalf to obtain balversa as she is not able to pay the out of pocket cost of the medication.  Pt was unsure who she needed to speak with but wanted to check if the application has been resubmitted and approved.  CSW sent a message to [facility] in pharmacy who will contact pt w/ an update.  CSW to remain available as appropriate.        Rachel Moulds, LCSW Clinical Social Worker [facility]

## 2023-02-12 ENCOUNTER — Ambulatory Visit: Payer: Medicare Other | Attending: Student | Admitting: Student

## 2023-02-13 ENCOUNTER — Ambulatory Visit (HOSPITAL_COMMUNITY)
Admission: RE | Admit: 2023-02-13 | Discharge: 2023-02-13 | Disposition: A | Discharge status: Home / Self Care | Payer: Medicare Other | Source: Ambulatory Visit | Attending: Vascular Surgery | Admitting: Vascular Surgery

## 2023-02-13 DIAGNOSIS — I739 Peripheral vascular disease, unspecified: Secondary | ICD-10-CM | POA: Insufficient documentation

## 2023-02-13 LAB — VAS US ABI WITH/WO TBI
Left ABI: 1.25
Right ABI: 0.73

## 2023-02-13 NOTE — Telephone Encounter (Signed)
Oral Oncology Patient Advocate Encounter  Called and checked application status. Per representative, application is pending BIV which should be conducted within the next 24 hours.  Jinger Neighbors, CPhT-Adv Oncology Pharmacy Patient Advocate [facility] Direct Number: [phone]  Fax: [phone]

## 2023-02-14 ENCOUNTER — Other Ambulatory Visit (HOSPITAL_COMMUNITY): Payer: Self-pay

## 2023-02-14 NOTE — Telephone Encounter (Signed)
Oral Oncology Patient Advocate Encounter   Submitted additional patient signatures to [place].   Application submitted via e-fax to [phone]   [facility] phone number [phone].   I will continue to check the status until final determination.   Jinger Neighbors, CPhT-Adv Oncology Pharmacy Patient Advocate [facility] Direct Number: [phone]  Fax: [phone]

## 2023-02-14 NOTE — Telephone Encounter (Signed)
Oral Oncology Patient Advocate Encounter  Reached out and spoke with patient regarding PAP paperwork, explained that I would send it to their preferred email via [employer].   Confirmed email address: [email or web address] .    Patient expressed understanding and consent.  Will follow up once paperwork has been signed and returned.   Jinger Neighbors, CPhT-Adv Oncology Pharmacy Patient Advocate [facility] Direct Number: [phone]  Fax: [phone]

## 2023-02-15 ENCOUNTER — Other Ambulatory Visit: Payer: Self-pay | Admitting: Internal Medicine

## 2023-02-15 NOTE — Telephone Encounter (Signed)
Called to check status of application. Informed by representative that signatures were received and did not see anything else on the application that was missing or needed. Representative sent message for [employer] PAP to continue processing application for assistance.   I will continue to follow until final determination.    Ardeen Fillers, CPhT Oncology Pharmacy Patient Advocate  [facility]  [phone] (phone) [phone] (fax) 02/15/2023 10:08 AM

## 2023-02-18 ENCOUNTER — Other Ambulatory Visit (HOSPITAL_COMMUNITY): Payer: Self-pay

## 2023-02-18 NOTE — Telephone Encounter (Signed)
Oral Oncology Patient Advocate Encounter  Submitted PA approval letter and copay to [place] at their request.  Information submitted via e-fax [phone]  Jinger Neighbors, CPhT-Adv Oncology Pharmacy Patient Advocate [facility] Direct Number: [phone]  Fax: [phone]

## 2023-02-19 ENCOUNTER — Telehealth: Payer: Self-pay | Admitting: Physician Assistant

## 2023-02-19 ENCOUNTER — Encounter: Payer: Self-pay | Admitting: Physician Assistant

## 2023-02-19 ENCOUNTER — Ambulatory Visit: Payer: Medicare Other | Admitting: Physician Assistant

## 2023-02-19 VITALS — BP 100/64 | HR 74 | Temp 97.2°F | Resp 18 | Ht 68.0 in | Wt 176.0 lb

## 2023-02-19 DIAGNOSIS — I739 Peripheral vascular disease, unspecified: Secondary | ICD-10-CM

## 2023-02-19 NOTE — Telephone Encounter (Signed)
Called patient regarding upcoming rescheduled September apportionments, patient is notified.

## 2023-02-19 NOTE — Progress Notes (Signed)
Office Note   History of Present Illness   Morgan Torres is a 72 y.o. (07-Nov-1950) female who presents for surveillance of PAD.  She has a history of atypical symptoms including left foot numbness and right foot discomfort.  She has no prior vascular interventions.  The patient returns today for follow up.  She has not been walking much due to the heat outside but recently bought a stepper for exercise.  She denies any rest pain or tissue loss.  Recently she has only had occasional right foot numbness.  Last year she had a radical nephrectomy for RCC and is about to start immunotherapy.  Current Outpatient Medications  Medication Sig Dispense Refill   acetaminophen (TYLENOL) 500 MG tablet Take 1 tablet (500 mg total) by mouth every 6 (six) hours as needed for mild pain. 30 tablet 0   albuterol (PROVENTIL HFA;VENTOLIN HFA) 108 (90 Base) MCG/ACT inhaler Inhale 2 puffs into the lungs every 6 (six) hours as needed for wheezing or shortness of breath. 1 Inhaler 0   amLODipine (NORVASC) 5 MG tablet TAKE 1 TABLET BY MOUTH EVERY DAY 15 tablet 0   aspirin EC 81 MG tablet Take 81 mg by mouth daily. Swallow whole.     atorvastatin (LIPITOR) 20 MG tablet Take 20 mg by mouth. Monday wed and Friday in am     cilostazol (PLETAL) 100 MG tablet Take 100 mg by mouth 2 (two) times daily.     EPINEPHrine (EPIPEN 2-PAK) 0.3 mg/0.3 mL DEVI Inject 0.3 mLs (0.3 mg total) into the muscle once as needed (for severe allergic reaction). CAll 911 immediately if you have to use this medicine 1 Device 1   erdafitinib (BALVERSA) 4 MG tablet Take 2 tablets (8 mg total) by mouth daily. 60 tablet 3   levothyroxine (SYNTHROID) 88 MCG tablet TAKE 1 TABLET BY MOUTH DAILY BEFORE BREAKFAST. 90 tablet 2   lidocaine-prilocaine (EMLA) cream Apply to the Port-A-Cath site 30-60-minute before treatment 30 g 2   losartan (COZAAR) 100 MG tablet Take 100 mg by mouth daily.     losartan-hydrochlorothiazide (HYZAAR) 100-12.5 MG tablet  Take 1 tablet by mouth daily.     metoprolol tartrate (LOPRESSOR) 25 MG tablet Take 1 tablet (25 mg total) by mouth 2 (two) times daily. Pt must keep upcoming appt in August 2024 with Cardiologist before anymore refills. Thank you Final attempt 60 tablet 1   nicotine (NICODERM CQ - DOSED IN MG/24 HOURS) 21 mg/24hr patch Place 21 mg onto the skin daily.     nicotine polacrilex (NICORETTE) 4 MG gum Take 4 mg by mouth as needed for smoking cessation.     nitroGLYCERIN (NITROSTAT) 0.4 MG SL tablet Place 1 tablet (0.4 mg total) under the tongue every 5 (five) minutes x 3 doses as needed for chest pain. 25 tablet 2   oxybutynin (DITROPAN) 5 MG tablet Take 1 tablet (5 mg total) by mouth every 8 (eight) hours as needed for bladder spasms. 30 tablet 1   polyethylene glycol (MIRALAX / GLYCOLAX) 17 g packet Take 17 g by mouth as needed.     prochlorperazine (COMPAZINE) 10 MG tablet Take 1 tablet (10 mg total) by mouth every 6 (six) hours as needed for nausea or vomiting. 30 tablet 0   temazepam (RESTORIL) 15 MG capsule Take 1 capsule (15 mg total) by mouth at bedtime as needed for sleep. 30 capsule 0   HYDROcodone-acetaminophen (NORCO/VICODIN) 5-325 MG tablet Take 1 tablet by mouth 3 (three)  times daily as needed. (Patient not taking: Reported on 02/19/2023)     traMADol (ULTRAM) 50 MG tablet Take by mouth every 6 (six) hours as needed. Not taking (Patient not taking: Reported on 02/19/2023)     No current facility-administered medications for this visit.    REVIEW OF SYSTEMS (negative unless checked):   Cardiac:  []  Chest pain or chest pressure? []  Shortness of breath upon activity? []  Shortness of breath when lying flat? []  Irregular heart rhythm?  Vascular:  []  Pain in calf, thigh, or hip brought on by walking? []  Pain in feet at night that wakes you up from your sleep? []  Blood clot in your veins? []  Leg swelling?  Pulmonary:  []  Oxygen at home? []  Productive cough? []  Wheezing?  Neurologic:   []  Sudden weakness in arms or legs? []  Sudden numbness in arms or legs? []  Sudden onset of difficult speaking or slurred speech? []  Temporary loss of vision in one eye? []  Problems with dizziness?  Gastrointestinal:  []  Blood in stool? []  Vomited blood?  Genitourinary:  []  Burning when urinating? []  Blood in urine?  Psychiatric:  []  Major depression  Hematologic:  []  Bleeding problems? []  Problems with blood clotting?  Dermatologic:  []  Rashes or ulcers?  Constitutional:  []  Fever or chills?  Ear/Nose/Throat:  []  Change in hearing? []  Nose bleeds? []  Sore throat?  Musculoskeletal:  []  Back pain? []  Joint pain? []  Muscle pain?   Physical Examination   Vitals:   02/19/23 0948  BP: 100/64  Pulse: 74  Resp: 18  Temp: (!) 97.2 F (36.2 C)  TempSrc: Temporal  SpO2: 98%  Weight: 176 lb (79.8 kg)  Height: 5\' 8"  (1.727 m)   Body mass index is 26.76 kg/m.  General:  WDWN in NAD; vital signs documented above Gait: Not observed HENT: WNL, normocephalic Pulmonary: normal non-labored breathing , without rales, rhonchi,  wheezing Cardiac: regular Abdomen: soft, NT, no masses Skin: without rashes Vascular Exam/Pulses: brisk right DP doppler signals. Palpable left DP Extremities: without ischemic changes, without gangrene , without cellulitis; without open wounds;  Musculoskeletal: no muscle wasting or atrophy  Neurologic: A&O X 3;  No focal weakness or paresthesias are detected Psychiatric:  The pt has Normal affect.  Non-Invasive Vascular imaging   ABI (02/13/2023) R:  ABI: 0.73 (0.68),  PT: mono DP: mono TBI:  0 L:  ABI: 1.25 (1.17),  PT: tri DP: bi TBI: 0   Medical Decision Making   Morgan Torres is a 72 y.o. female who presents for surveillance of PAD  Based on the patient's vascular studies, her ABIs are essentially unchanged from her last visit.  Her right ABI 0.73 and left ABI is 1.25 She has not been walking much but denies any  claudication.  She also denies any rest pain or tissue loss.  She still has occasional numbness of her toes on the right On exam she has brisk DP/PT Doppler signals bilaterally.  Her feet are warm and well-perfused She can follow-up with our office in 1 year with ABIs   Loel Dubonnet PA-C [facility] Office: [phone]  Clinic MD: Steve Rattler

## 2023-02-19 NOTE — Telephone Encounter (Signed)
Oral Oncology Patient Advocate Encounter   Received notification that the application for assistance for Balversa through [employer] has been approved.   [facility] phone number [phone].   Effective dates: 02/19/23 through 02/19/24  Medication will be filled at [facility].  Jinger Neighbors, CPhT-Adv Oncology Pharmacy Patient Advocate [facility] Direct Number: [phone]  Fax: [phone]

## 2023-02-20 ENCOUNTER — Ambulatory Visit: Payer: Medicare Other

## 2023-02-20 ENCOUNTER — Ambulatory Visit: Payer: Medicare Other | Admitting: Physician Assistant

## 2023-02-20 ENCOUNTER — Other Ambulatory Visit: Payer: Medicare Other

## 2023-02-26 ENCOUNTER — Inpatient Hospital Stay: Payer: Medicare Other | Attending: Internal Medicine | Admitting: Licensed Clinical Social Worker

## 2023-02-26 DIAGNOSIS — G4700 Insomnia, unspecified: Secondary | ICD-10-CM | POA: Insufficient documentation

## 2023-02-26 DIAGNOSIS — Z881 Allergy status to other antibiotic agents status: Secondary | ICD-10-CM | POA: Insufficient documentation

## 2023-02-26 DIAGNOSIS — Z79899 Other long term (current) drug therapy: Secondary | ICD-10-CM | POA: Insufficient documentation

## 2023-02-26 DIAGNOSIS — R918 Other nonspecific abnormal finding of lung field: Secondary | ICD-10-CM | POA: Insufficient documentation

## 2023-02-26 DIAGNOSIS — C7910 Secondary malignant neoplasm of unspecified urinary organs: Secondary | ICD-10-CM

## 2023-02-26 DIAGNOSIS — I252 Old myocardial infarction: Secondary | ICD-10-CM | POA: Insufficient documentation

## 2023-02-26 DIAGNOSIS — G8929 Other chronic pain: Secondary | ICD-10-CM | POA: Insufficient documentation

## 2023-02-26 DIAGNOSIS — K5900 Constipation, unspecified: Secondary | ICD-10-CM | POA: Insufficient documentation

## 2023-02-26 DIAGNOSIS — C679 Malignant neoplasm of bladder, unspecified: Secondary | ICD-10-CM | POA: Insufficient documentation

## 2023-02-26 DIAGNOSIS — Z9089 Acquired absence of other organs: Secondary | ICD-10-CM | POA: Insufficient documentation

## 2023-02-26 DIAGNOSIS — Z885 Allergy status to narcotic agent status: Secondary | ICD-10-CM | POA: Insufficient documentation

## 2023-02-26 DIAGNOSIS — M549 Dorsalgia, unspecified: Secondary | ICD-10-CM | POA: Insufficient documentation

## 2023-02-26 DIAGNOSIS — G4730 Sleep apnea, unspecified: Secondary | ICD-10-CM | POA: Insufficient documentation

## 2023-02-26 DIAGNOSIS — R5383 Other fatigue: Secondary | ICD-10-CM | POA: Insufficient documentation

## 2023-02-26 DIAGNOSIS — Z79890 Hormone replacement therapy (postmenopausal): Secondary | ICD-10-CM | POA: Insufficient documentation

## 2023-02-26 NOTE — Telephone Encounter (Signed)
Oral Chemotherapy Pharmacist Encounter   Spoke with patient today to follow up regarding patient's oral chemotherapy medication: Balversa (erdafitinib)  Patient states she received the medication from the manufacturer on 02/22/23 and started the medication the same day (02/22/23).  Patient has follow up appointment scheduled on 03/07/23, which will be on day 14 of current cycle to check tolerance and labs (including phosphate).   Patient knows to call the office with questions or concerns.  Lenord Carbo, PharmD, BCPS, BCOP Hematology/Oncology Clinical Pharmacist [facility] Oral Chemotherapy [facility] [phone] 02/26/2023 10:04 AM

## 2023-02-26 NOTE — Progress Notes (Signed)
[facility] CSW Progress Note  Clinical Social Worker  submitted Human resources officer for $500 on behalf of pt.  CSW to remain available as appropriate throughout duration of treatment.        Rachel Moulds, LCSW Clinical Social Worker [facility]

## 2023-02-27 ENCOUNTER — Ambulatory Visit: Payer: Medicare Other | Admitting: Physician Assistant

## 2023-02-27 ENCOUNTER — Other Ambulatory Visit: Payer: Medicare Other

## 2023-03-05 NOTE — Progress Notes (Unsigned)
[facility] OFFICE PROGRESS NOTE  Morgan Mires, MD [address] [place] [address]  DIAGNOSIS: Metastatic urothelial carcinoma that was initially diagnosed as stage II (T2, N0, M0) in September 2022 status post right nephroureterectomy as well as intravesical treatment and resection of superficial tumor in the bladder several times and March 2023 as well as July 2023.  The patient was found to have enlarging and new pulmonary nodules consistent with metastatic disease in November 2023.   PRIOR THERAPY: 1) Right nephroureterectomy as well as intravesical treatment and resection of superficial tumor in the bladder several times and March 2023 as well as July 2023.  2) Palliative systemic chemotherapy with carboplatin for AUC of 5 on day 1 and gemcitabine 1000 mg/M2 on days 1 and 8 every 3 weeks. First cycle June 21, 2022. Status post 6 cycles. Her dose of carboplatin was reduced to AUC of 4 and gemcitabine to 800 mg/m2 starting from cycle #5 due to cytopenias. 3) Maintenance immunotherapy with Avelumab 800 mg IV every 2 weeks. First dose on 11/15/22. Status post 6 cycle of treatment.  CURRENT THERAPY: Erdafitinib (balversa) 8 mg p.o daily. First dose 02/22/23  INTERVAL HISTORY: Morgan Torres 72 y.o. female returns to the clinic today for follow-up visit.  The patient was last seen by myself and Dr. Arbutus Ped on 02/06/2023.  At that point time, the patient was found to have evidence of disease progression with enlarged bilateral pulmonary nodules consistent with progressive metastatic disease.  Therefore her treatment was discontinued and she was started on targeted treatment with Balversa.  She was able to start this medication on 02/22/2023.  Thus far, she is tolerated it ***.  She was able to get a baseline eye exam.  She is scheduled for her next monthly eye exam on ***.  Needs eye exams monthly for 4 months followed by every 3 months thereon after.  She also had a baseline  phosphate drawn.  Also in the interval, she has been following with the Child psychotherapist for counseling.  This has been ***she is prescribed Restoril for insomnia.  Since starting her on, she denies any noticeable adverse side effects except for ***fatigue?  Her appetite is***.  **Her weight is*for insomnia she is taking melatonin and Restoril?  Confirm she is taking melatonin.  Denies any fever, chills, or night sweats.  She reports stable night sweats which have been occurring for majority of her life.  Denies any cough, hemoptysis, or chest pain..  She may have occasional dyspnea on exertion but overall her breathing is* *.  Denies any nausea, vomiting, diarrhea, or constipation.  He denies any rashes or skin changes.  Denies any abdominal pain.  Denies any blood in the urine.  Denies any malodorous urine or cloudy urine.  She is here today for evaluation repeat blood work*    Hyperphosphatemia, eye changes, diarrhea and hand-foot syndrome using utterly smooth extra care cream cystitis, dry mouth, taste changes and elevated LFTs.      Received medication from the manufacturer on 02/22/2023 and started on 02/22/2023 Sent in prescription for Synthroid.  MEDICAL HISTORY: Past Medical History:  Diagnosis Date   Anxiety    Arthritis    hip, lumbar spine    Asthma    seasonal allergies    Bronchitis    Hx: of   Chronic kidney disease    COPD (chronic obstructive pulmonary disease) (HCC)    Coronary artery disease    Depression  Fibromyalgia    Hypertension    Lumbar herniated disc    Myocardial infarction (HCC) 06/25/2012   followed by Dr. Rennis Golden, treated medically, no stents   Peripheral arterial disease (HCC)    of right foot   Pneumonia    Hx: of yrs ago   Sciatica    Sleep apnea    Small bowel obstruction (HCC)    several yrs ago    ALLERGIES:  is allergic to diclofenac, codeine, hydrocodone, and ciprofloxacin.  MEDICATIONS:  Current Outpatient Medications  Medication  Sig Dispense Refill   acetaminophen (TYLENOL) 500 MG tablet Take 1 tablet (500 mg total) by mouth every 6 (six) hours as needed for mild pain. 30 tablet 0   albuterol (PROVENTIL HFA;VENTOLIN HFA) 108 (90 Base) MCG/ACT inhaler Inhale 2 puffs into the lungs every 6 (six) hours as needed for wheezing or shortness of breath. 1 Inhaler 0   amLODipine (NORVASC) 5 MG tablet TAKE 1 TABLET BY MOUTH EVERY DAY 15 tablet 0   aspirin EC 81 MG tablet Take 81 mg by mouth daily. Swallow whole.     atorvastatin (LIPITOR) 20 MG tablet Take 20 mg by mouth. Monday wed and Friday in am     cilostazol (PLETAL) 100 MG tablet Take 100 mg by mouth 2 (two) times daily.     EPINEPHrine (EPIPEN 2-PAK) 0.3 mg/0.3 mL DEVI Inject 0.3 mLs (0.3 mg total) into the muscle once as needed (for severe allergic reaction). CAll 911 immediately if you have to use this medicine 1 Device 1   erdafitinib (BALVERSA) 4 MG tablet Take 2 tablets (8 mg total) by mouth daily. 60 tablet 3   HYDROcodone-acetaminophen (NORCO/VICODIN) 5-325 MG tablet Take 1 tablet by mouth 3 (three) times daily as needed. (Patient not taking: Reported on 02/19/2023)     levothyroxine (SYNTHROID) 88 MCG tablet TAKE 1 TABLET BY MOUTH DAILY BEFORE BREAKFAST. 90 tablet 2   lidocaine-prilocaine (EMLA) cream Apply to the Port-A-Cath site 30-60-minute before treatment 30 g 2   losartan (COZAAR) 100 MG tablet Take 100 mg by mouth daily.     losartan-hydrochlorothiazide (HYZAAR) 100-12.5 MG tablet Take 1 tablet by mouth daily.     metoprolol tartrate (LOPRESSOR) 25 MG tablet Take 1 tablet (25 mg total) by mouth 2 (two) times daily. Pt must keep upcoming appt in August 2024 with Cardiologist before anymore refills. Thank you Final attempt 60 tablet 1   nicotine (NICODERM CQ - DOSED IN MG/24 HOURS) 21 mg/24hr patch Place 21 mg onto the skin daily.     nicotine polacrilex (NICORETTE) 4 MG gum Take 4 mg by mouth as needed for smoking cessation.     nitroGLYCERIN (NITROSTAT) 0.4 MG  SL tablet Place 1 tablet (0.4 mg total) under the tongue every 5 (five) minutes x 3 doses as needed for chest pain. 25 tablet 2   oxybutynin (DITROPAN) 5 MG tablet Take 1 tablet (5 mg total) by mouth every 8 (eight) hours as needed for bladder spasms. 30 tablet 1   polyethylene glycol (MIRALAX / GLYCOLAX) 17 g packet Take 17 g by mouth as needed.     prochlorperazine (COMPAZINE) 10 MG tablet Take 1 tablet (10 mg total) by mouth every 6 (six) hours as needed for nausea or vomiting. 30 tablet 0   temazepam (RESTORIL) 15 MG capsule Take 1 capsule (15 mg total) by mouth at bedtime as needed for sleep. 30 capsule 0   traMADol (ULTRAM) 50 MG tablet Take by mouth every 6 (six)  hours as needed. Not taking (Patient not taking: Reported on 02/19/2023)     No current facility-administered medications for this visit.    SURGICAL HISTORY:  Past Surgical History:  Procedure Laterality Date   ABDOMINAL SURGERY     resection of small intestine   ANTERIOR CRUCIATE LIGAMENT REPAIR Bilateral    BACK SURGERY     fusion of lumbar   CARDIAC CATHETERIZATION  06/25/2012   COLON SURGERY     resection for bowel - for obstruction  6 to 7 yrs ago per pt on 01-05-2022   COLONOSCOPY     Hx; of   DILATION AND CURETTAGE OF UTERUS     EYE SURGERY Bilateral    cataracts removed   HERNIA REPAIR  02/23/1969   inguinal hernia    IR IMAGING GUIDED PORT INSERTION  06/07/2022   LEFT HEART CATHETERIZATION WITH CORONARY ANGIOGRAM N/A 03/30/2013   Procedure: LEFT HEART CATHETERIZATION WITH CORONARY ANGIOGRAM;  Surgeon: Marykay Lex, MD;  Location: [facility] CATH LAB;  Service: Cardiovascular;  Laterality: N/A;   left thumb surgery     yrs ago   ROBOT ASSITED LAPAROSCOPIC NEPHROURETERECTOMY Left 03/09/2021   Procedure: XI ROBOT ASSITED LAPAROSCOPIC NEPHROURETERECTOMY/ CYSTOSCOPY WITH LEFT URETEROSCOPY WITH TRANSURETHRAL RESECTION OF URETERAL ORIFICE;  Surgeon: Rene Paci, MD;  Location: [facility] ORS;  Service: Urology;   Laterality: Left;   SALIVARY GLAND SURGERY Left    approached from inside mouth & side of neck 1970-1980   TONSILLECTOMY     as child   TOTAL HIP ARTHROPLASTY Left 05/28/2014   Procedure: LEFT TOTAL HIP ARTHROPLASTY;  Surgeon: Thera Flake., MD;  Location: [facility] OR;  Service: Orthopedics;  Laterality: Left;   TRANSURETHRAL RESECTION OF BLADDER TUMOR N/A 08/23/2021   Procedure: TRANSURETHRAL RESECTION OF BLADDER TUMOR (TURBT) WITH CYSTOSCOPY/ POSTOPERATIVE INSTILLATION OF GEMCITABINE/retrograde pyelogram and stent placement (right);  Surgeon: Rene Paci, MD;  Location: [facility];  Service: Urology;  Laterality: N/A;   TRANSURETHRAL RESECTION OF BLADDER TUMOR N/A 01/10/2022   Procedure: TRANSURETHRAL RESECTION OF BLADDER TUMOR (TURBT) WITH CYSTOSCOPY / GEMCITABINE INSTILLATION post-operatively;  Surgeon: Rene Paci, MD;  Location: [facility];  Service: Urology;  Laterality: N/A;  ONLY NEEDS 30 MIN    REVIEW OF SYSTEMS:   Review of Systems  Constitutional: Negative for appetite change, chills, fatigue, fever and unexpected weight change.  HENT:   Negative for mouth sores, nosebleeds, sore throat and trouble swallowing.   Eyes: Negative for eye problems and icterus.  Respiratory: Negative for cough, hemoptysis, shortness of breath and wheezing.   Cardiovascular: Negative for chest pain and leg swelling.  Gastrointestinal: Negative for abdominal pain, constipation, diarrhea, nausea and vomiting.  Genitourinary: Negative for bladder incontinence, difficulty urinating, dysuria, frequency and hematuria.   Musculoskeletal: Negative for back pain, gait problem, neck pain and neck stiffness.  Skin: Negative for itching and rash.  Neurological: Negative for dizziness, extremity weakness, gait problem, headaches, light-headedness and seizures.  Hematological: Negative for adenopathy. Does not bruise/bleed easily.  Psychiatric/Behavioral:  Negative for confusion, depression and sleep disturbance. The patient is not nervous/anxious.     PHYSICAL EXAMINATION:  There were no vitals taken for this visit.  ECOG PERFORMANCE STATUS: {CHL ONC ECOG [id number]  Physical Exam  Constitutional: Oriented to person, place, and time and well-developed, well-nourished, and in no distress. No distress.  HENT:  Head: Normocephalic and atraumatic.  Mouth/Throat: Oropharynx is clear and moist. No oropharyngeal exudate.  Eyes: Conjunctivae are normal.  Right eye exhibits no discharge. Left eye exhibits no discharge. No scleral icterus.  Neck: Normal range of motion. Neck supple.  Cardiovascular: Normal rate, regular rhythm, normal heart sounds and intact distal pulses.   Pulmonary/Chest: Effort normal and breath sounds normal. No respiratory distress. No wheezes. No rales.  Abdominal: Soft. Bowel sounds are normal. Exhibits no distension and no mass. There is no tenderness.  Musculoskeletal: Normal range of motion. Exhibits no edema.  Lymphadenopathy:    No cervical adenopathy.  Neurological: Alert and oriented to person, place, and time. Exhibits normal muscle tone. Gait normal. Coordination normal.  Skin: Skin is warm and dry. No rash noted. Not diaphoretic. No erythema. No pallor.  Psychiatric: Mood, memory and judgment normal.  Vitals reviewed.  LABORATORY DATA: Lab Results  Component Value Date   WBC 6.5 02/06/2023   HGB 12.1 02/06/2023   HCT 35.5 (L) 02/06/2023   MCV 100.3 (H) 02/06/2023   PLT 277 02/06/2023      Chemistry      Component Value Date/Time   NA 133 (L) 02/06/2023 1136   K 4.4 02/06/2023 1136   CL 103 02/06/2023 1136   CO2 27 02/06/2023 1136   BUN 32 (H) 02/06/2023 1136   CREATININE 1.89 (H) 02/06/2023 1136      Component Value Date/Time   CALCIUM 8.8 (L) 02/06/2023 1136   ALKPHOS 66 02/06/2023 1136   AST 27 02/06/2023 1136   ALT 33 02/06/2023 1136   BILITOT 0.3 02/06/2023 1136       RADIOGRAPHIC  STUDIES:  VAS [place] ABI WITH/WO TBI  Result Date: 02/13/2023  LOWER EXTREMITY DOPPLER STUDY Patient Name:  Morgan Torres  Date of Exam:   02/13/2023 Medical Rec #: [id number]       Accession #:    [id number] Date of Birth: Nov 09, 1950      Patient Gender: F Patient Age:   77 years Exam Location:  [facility] Procedure:      VAS [place] ABI WITH/WO TBI Referring Phys: Heath Lark --------------------------------------------------------------------------------  Indications: Peripheral artery disease. High Risk Factors: Hypertension, hyperlipidemia, current smoker, prior MI,                    coronary artery disease.  Comparison Study: 04/03/2022 ABI/TBI- right=0.68/0.20, left=1.17/0.50 Performing Technologist: Gertie Fey MHA, RVT, RDCS, RDMS  Examination Guidelines: A complete evaluation includes at minimum, Doppler waveform signals and systolic blood pressure reading at the level of bilateral brachial, anterior tibial, and posterior tibial arteries, when vessel segments are accessible. Bilateral testing is considered an integral part of a complete examination. Photoelectric Plethysmograph (PPG) waveforms and toe systolic pressure readings are included as required and additional duplex testing as needed. Limited examinations for reoccurring indications may be performed as noted.  ABI Findings: +---------+------------------+-----+------------------+--------+ Right    Rt Pressure (mmHg)IndexWaveform          Comment  +---------+------------------+-----+------------------+--------+ Brachial 118                                               +---------+------------------+-----+------------------+--------+ PTA                             unable to insonate         +---------+------------------+-----+------------------+--------+ DP       87  0.73 monophasic                 +---------+------------------+-----+------------------+--------+ Great Toe                        unable to insonate         +---------+------------------+-----+------------------+--------+ +---------+------------------+-----+------------------+-------+ Left     Lt Pressure (mmHg)IndexWaveform          Comment +---------+------------------+-----+------------------+-------+ Brachial 119                                              +---------+------------------+-----+------------------+-------+ PTA      149               1.25 triphasic                 +---------+------------------+-----+------------------+-------+ DP       132               1.11 biphasic                  +---------+------------------+-----+------------------+-------+ Great Toe                       unable to insonate        +---------+------------------+-----+------------------+-------+ +-------+-----------+-----------+------------+------------+ ABI/TBIToday's ABIToday's TBIPrevious ABIPrevious TBI +-------+-----------+-----------+------------+------------+ Right  0.73       0.00       0.68        0.20         +-------+-----------+-----------+------------+------------+ Left   1.25       0.00       1.17        0.50         +-------+-----------+-----------+------------+------------+  Bilateral ABIs appear essentially unchanged compared to prior study on 04/03/2022. Bilateral TBIs appear decreased compared to prior study on 04/03/2022.  Summary: Right: Resting right ankle-brachial index indicates moderate right lower extremity arterial disease. The right toe-brachial index is abnormal. Left: Resting left ankle-brachial index is within normal range. The left toe-brachial index is abnormal. *See table(s) above for measurements and observations.  Electronically signed by Lemar Livings MD on 02/13/2023 at 1:23:29 PM.    Final      ASSESSMENT/PLAN:  This is a very pleasant 72 year old African-American female with metastatic urothelial carcinoma.  She was initially diagnosed as a stage II (T2, N0,  M0) in September 2022.   She is status post right nephroureterectomy as well as  intravesical treatment and resection of superficial tumor in the bladder several times and March 2023 as well as July 2023.  The patient was found to have enlarging and new pulmonary nodules consistent with metastatic disease in November 2023.   Dr. Arbutus Ped sent her tissue for foundation 1 and PD-L1 expression. She does not have any actionable mutations  She completed 6 cycles of systemic chemotherapy with carboplatin for an AUC of 5 on day 1 and gemcitabine 1000 milligrams per meter squared on days 1 and 8 IV every 3 weeks with Neulasta support.  Her dose was reduced to carboplatin for an AUC of 4 and gemcitabine 800 mg/m due to cytopenias transfusion support.   Dr. Arbutus Ped recommended maintenance immunotherapy with Avelumab 2 mg IV every 2 weeks.  She is status post 6 cycles of treatment and she tolerated it well without any concerning adverse side.   She had repeat  imaging in August 2024 showing interval enlargement of bilateral pulmonary nodules consistent with progressive metastatic disease.   She started on EGFR therapy with Balversa 8 milligrams p.o. daily. She started this on 02/22/23. Thus far, she has tolerated this ***  The patient was seen with Dr. Arbutus Ped. Labs were reviewed. Recommend ***  We will see her back in 2 weeks for evaluation and repeat blood work with CBC, phosphorus, and CMP.    She will continue with the social work team for counseling.   She knows to obtain eye exam monthly for 4 months, then every 3 months there on after.   The patient was advised to call immediately if she has any concerning symptoms in the interval. The patient voices understanding of current disease status and treatment options and is in agreement with the current care plan. All questions were answered. The patient knows to call the clinic with any problems, questions or concerns. We can certainly see the patient  much sooner if necessary   No orders of the defined types were placed in this encounter.    I spent {CHL ONC TIME VISIT - WUJWJ:[id number]} counseling the patient face to face. The total time spent in the appointment was {CHL ONC TIME VISIT - OZHYQ:[id number]}.  Morgan Torres L Malvina Schadler, PA-C 03/05/23

## 2023-03-06 ENCOUNTER — Other Ambulatory Visit: Payer: Medicare Other

## 2023-03-06 ENCOUNTER — Ambulatory Visit: Payer: Medicare Other | Admitting: Physician Assistant

## 2023-03-06 ENCOUNTER — Ambulatory Visit: Payer: Medicare Other

## 2023-03-07 ENCOUNTER — Ambulatory Visit: Payer: Medicare Other

## 2023-03-07 ENCOUNTER — Other Ambulatory Visit: Payer: Medicare Other

## 2023-03-07 ENCOUNTER — Inpatient Hospital Stay: Payer: Medicare Other

## 2023-03-07 ENCOUNTER — Encounter: Payer: Medicare Other | Admitting: Nutrition

## 2023-03-07 ENCOUNTER — Inpatient Hospital Stay: Payer: Medicare Other | Admitting: Physician Assistant

## 2023-03-07 ENCOUNTER — Ambulatory Visit: Payer: Medicare Other | Admitting: Internal Medicine

## 2023-03-07 DIAGNOSIS — C7800 Secondary malignant neoplasm of unspecified lung: Secondary | ICD-10-CM | POA: Diagnosis not present

## 2023-03-07 DIAGNOSIS — R5383 Other fatigue: Secondary | ICD-10-CM | POA: Diagnosis not present

## 2023-03-07 DIAGNOSIS — G8929 Other chronic pain: Secondary | ICD-10-CM | POA: Diagnosis not present

## 2023-03-07 DIAGNOSIS — C679 Malignant neoplasm of bladder, unspecified: Secondary | ICD-10-CM

## 2023-03-07 DIAGNOSIS — G4700 Insomnia, unspecified: Secondary | ICD-10-CM | POA: Diagnosis not present

## 2023-03-07 DIAGNOSIS — K5900 Constipation, unspecified: Secondary | ICD-10-CM | POA: Diagnosis not present

## 2023-03-07 DIAGNOSIS — Z95828 Presence of other vascular implants and grafts: Secondary | ICD-10-CM

## 2023-03-07 DIAGNOSIS — Z885 Allergy status to narcotic agent status: Secondary | ICD-10-CM | POA: Diagnosis not present

## 2023-03-07 DIAGNOSIS — R918 Other nonspecific abnormal finding of lung field: Secondary | ICD-10-CM | POA: Diagnosis not present

## 2023-03-07 DIAGNOSIS — M549 Dorsalgia, unspecified: Secondary | ICD-10-CM | POA: Diagnosis not present

## 2023-03-07 DIAGNOSIS — Z79899 Other long term (current) drug therapy: Secondary | ICD-10-CM | POA: Diagnosis not present

## 2023-03-07 DIAGNOSIS — G4730 Sleep apnea, unspecified: Secondary | ICD-10-CM | POA: Diagnosis not present

## 2023-03-07 DIAGNOSIS — I252 Old myocardial infarction: Secondary | ICD-10-CM | POA: Diagnosis not present

## 2023-03-07 DIAGNOSIS — C7910 Secondary malignant neoplasm of unspecified urinary organs: Secondary | ICD-10-CM

## 2023-03-07 DIAGNOSIS — Z881 Allergy status to other antibiotic agents status: Secondary | ICD-10-CM | POA: Diagnosis not present

## 2023-03-07 DIAGNOSIS — Z79890 Hormone replacement therapy (postmenopausal): Secondary | ICD-10-CM | POA: Diagnosis not present

## 2023-03-07 DIAGNOSIS — Z9089 Acquired absence of other organs: Secondary | ICD-10-CM | POA: Diagnosis not present

## 2023-03-07 LAB — CBC WITH DIFFERENTIAL (CANCER CENTER ONLY)
Abs Immature Granulocytes: 0.02 10*3/uL (ref 0.00–0.07)
Basophils Absolute: 0 10*3/uL (ref 0.0–0.1)
Basophils Relative: 1 %
Eosinophils Absolute: 0.1 10*3/uL (ref 0.0–0.5)
Eosinophils Relative: 2 %
HCT: 34.2 % — ABNORMAL LOW (ref 36.0–46.0)
Hemoglobin: 11.5 g/dL — ABNORMAL LOW (ref 12.0–15.0)
Immature Granulocytes: 0 %
Lymphocytes Relative: 43 %
Lymphs Abs: 2.6 10*3/uL (ref 0.7–4.0)
MCH: 34.1 pg — ABNORMAL HIGH (ref 26.0–34.0)
MCHC: 33.6 g/dL (ref 30.0–36.0)
MCV: 101.5 fL — ABNORMAL HIGH (ref 80.0–100.0)
Monocytes Absolute: 0.5 10*3/uL (ref 0.1–1.0)
Monocytes Relative: 8 %
Neutro Abs: 2.9 10*3/uL (ref 1.7–7.7)
Neutrophils Relative %: 46 %
Platelet Count: 239 10*3/uL (ref 150–400)
RBC: 3.37 MIL/uL — ABNORMAL LOW (ref 3.87–5.11)
RDW: 14.4 % (ref 11.5–15.5)
WBC Count: 6.1 10*3/uL (ref 4.0–10.5)
nRBC: 0 % (ref 0.0–0.2)

## 2023-03-07 LAB — CMP (CANCER CENTER ONLY)
ALT: 27 U/L (ref 0–44)
AST: 22 U/L (ref 15–41)
Albumin: 3.7 g/dL (ref 3.5–5.0)
Alkaline Phosphatase: 86 U/L (ref 38–126)
Anion gap: 6 (ref 5–15)
BUN: 14 mg/dL (ref 8–23)
CO2: 23 mmol/L (ref 22–32)
Calcium: 8.9 mg/dL (ref 8.9–10.3)
Chloride: 104 mmol/L (ref 98–111)
Creatinine: 1.66 mg/dL — ABNORMAL HIGH (ref 0.44–1.00)
GFR, Estimated: 32 mL/min — ABNORMAL LOW (ref >60)
Glucose, Bld: 100 mg/dL — ABNORMAL HIGH (ref 70–99)
Potassium: 5 mmol/L (ref 3.5–5.1)
Sodium: 133 mmol/L — ABNORMAL LOW (ref 135–145)
Total Bilirubin: 0.3 mg/dL (ref 0.3–1.2)
Total Protein: 6.3 g/dL — ABNORMAL LOW (ref 6.5–8.1)

## 2023-03-07 LAB — PHOSPHORUS: Phosphorus: 5.2 mg/dL — ABNORMAL HIGH (ref 2.5–4.6)

## 2023-03-07 MED ORDER — LIDOCAINE-PRILOCAINE 2.5-2.5 % EX CREA
TOPICAL_CREAM | CUTANEOUS | 2 refills | Status: DC
Start: 2023-03-07 — End: 2023-07-15

## 2023-03-07 MED ORDER — SODIUM CHLORIDE 0.9% FLUSH
10.0000 mL | Freq: Once | INTRAVENOUS | Status: AC
  Administered 2023-03-07: 10 mL

## 2023-03-07 MED ORDER — HEPARIN SOD (PORK) LOCK FLUSH 100 UNIT/ML IV SOLN
500.0000 [IU] | Freq: Once | INTRAVENOUS | Status: AC
  Administered 2023-03-07: 500 [IU]

## 2023-03-08 ENCOUNTER — Other Ambulatory Visit: Payer: Self-pay

## 2023-03-08 DIAGNOSIS — I739 Peripheral vascular disease, unspecified: Secondary | ICD-10-CM

## 2023-03-12 DIAGNOSIS — M545 Low back pain, unspecified: Secondary | ICD-10-CM | POA: Diagnosis not present

## 2023-03-12 DIAGNOSIS — M25551 Pain in right hip: Secondary | ICD-10-CM | POA: Diagnosis not present

## 2023-03-15 DIAGNOSIS — M25551 Pain in right hip: Secondary | ICD-10-CM | POA: Diagnosis not present

## 2023-03-19 ENCOUNTER — Other Ambulatory Visit: Payer: Self-pay | Admitting: Internal Medicine

## 2023-03-20 ENCOUNTER — Other Ambulatory Visit: Payer: Self-pay

## 2023-03-20 DIAGNOSIS — D649 Anemia, unspecified: Secondary | ICD-10-CM

## 2023-03-20 DIAGNOSIS — D6481 Anemia due to antineoplastic chemotherapy: Secondary | ICD-10-CM

## 2023-03-21 ENCOUNTER — Ambulatory Visit: Payer: Medicare Other | Admitting: Internal Medicine

## 2023-03-21 ENCOUNTER — Encounter: Payer: Medicare Other | Admitting: Nutrition

## 2023-03-21 ENCOUNTER — Ambulatory Visit: Payer: Medicare Other

## 2023-03-21 ENCOUNTER — Inpatient Hospital Stay: Payer: Medicare Other | Admitting: Internal Medicine

## 2023-03-21 ENCOUNTER — Other Ambulatory Visit: Payer: Medicare Other

## 2023-03-21 ENCOUNTER — Inpatient Hospital Stay: Payer: Medicare Other

## 2023-03-21 DIAGNOSIS — H2513 Age-related nuclear cataract, bilateral: Secondary | ICD-10-CM | POA: Diagnosis not present

## 2023-03-22 ENCOUNTER — Telehealth: Payer: Self-pay | Admitting: Internal Medicine

## 2023-03-22 NOTE — Telephone Encounter (Signed)
Called to reschedule missed 09/26 appointment, patient has been rescheduled and is notified of new appointment time.

## 2023-03-26 ENCOUNTER — Inpatient Hospital Stay: Payer: Medicare Other | Attending: Internal Medicine

## 2023-03-26 ENCOUNTER — Inpatient Hospital Stay (HOSPITAL_BASED_OUTPATIENT_CLINIC_OR_DEPARTMENT_OTHER): Payer: Medicare Other | Admitting: Internal Medicine

## 2023-03-26 ENCOUNTER — Inpatient Hospital Stay: Payer: Medicare Other

## 2023-03-26 VITALS — BP 111/43 | HR 50 | Temp 97.6°F | Resp 16

## 2023-03-26 VITALS — BP 88/57 | HR 65 | Temp 97.5°F | Resp 17 | Ht 68.0 in | Wt 174.0 lb

## 2023-03-26 DIAGNOSIS — R630 Anorexia: Secondary | ICD-10-CM | POA: Insufficient documentation

## 2023-03-26 DIAGNOSIS — Z95828 Presence of other vascular implants and grafts: Secondary | ICD-10-CM

## 2023-03-26 DIAGNOSIS — Z7902 Long term (current) use of antithrombotics/antiplatelets: Secondary | ICD-10-CM | POA: Insufficient documentation

## 2023-03-26 DIAGNOSIS — C679 Malignant neoplasm of bladder, unspecified: Secondary | ICD-10-CM | POA: Insufficient documentation

## 2023-03-26 DIAGNOSIS — C7910 Secondary malignant neoplasm of unspecified urinary organs: Secondary | ICD-10-CM

## 2023-03-26 DIAGNOSIS — I2510 Atherosclerotic heart disease of native coronary artery without angina pectoris: Secondary | ICD-10-CM | POA: Insufficient documentation

## 2023-03-26 DIAGNOSIS — R059 Cough, unspecified: Secondary | ICD-10-CM | POA: Insufficient documentation

## 2023-03-26 DIAGNOSIS — C7800 Secondary malignant neoplasm of unspecified lung: Secondary | ICD-10-CM | POA: Diagnosis not present

## 2023-03-26 DIAGNOSIS — Z9089 Acquired absence of other organs: Secondary | ICD-10-CM | POA: Diagnosis not present

## 2023-03-26 DIAGNOSIS — M25551 Pain in right hip: Secondary | ICD-10-CM | POA: Diagnosis not present

## 2023-03-26 DIAGNOSIS — I252 Old myocardial infarction: Secondary | ICD-10-CM | POA: Insufficient documentation

## 2023-03-26 DIAGNOSIS — Z79899 Other long term (current) drug therapy: Secondary | ICD-10-CM | POA: Insufficient documentation

## 2023-03-26 DIAGNOSIS — R197 Diarrhea, unspecified: Secondary | ICD-10-CM | POA: Insufficient documentation

## 2023-03-26 DIAGNOSIS — Z885 Allergy status to narcotic agent status: Secondary | ICD-10-CM | POA: Diagnosis not present

## 2023-03-26 DIAGNOSIS — D649 Anemia, unspecified: Secondary | ICD-10-CM | POA: Insufficient documentation

## 2023-03-26 DIAGNOSIS — I10 Essential (primary) hypertension: Secondary | ICD-10-CM | POA: Diagnosis not present

## 2023-03-26 DIAGNOSIS — Z881 Allergy status to other antibiotic agents status: Secondary | ICD-10-CM | POA: Insufficient documentation

## 2023-03-26 DIAGNOSIS — M545 Low back pain, unspecified: Secondary | ICD-10-CM | POA: Diagnosis not present

## 2023-03-26 DIAGNOSIS — M549 Dorsalgia, unspecified: Secondary | ICD-10-CM | POA: Insufficient documentation

## 2023-03-26 DIAGNOSIS — R5383 Other fatigue: Secondary | ICD-10-CM | POA: Diagnosis not present

## 2023-03-26 DIAGNOSIS — R634 Abnormal weight loss: Secondary | ICD-10-CM | POA: Diagnosis not present

## 2023-03-26 LAB — CMP (CANCER CENTER ONLY)
ALT: 31 U/L (ref 0–44)
AST: 21 U/L (ref 15–41)
Albumin: 3.9 g/dL (ref 3.5–5.0)
Alkaline Phosphatase: 87 U/L (ref 38–126)
Anion gap: 7 (ref 5–15)
BUN: 18 mg/dL (ref 8–23)
CO2: 23 mmol/L (ref 22–32)
Calcium: 9.4 mg/dL (ref 8.9–10.3)
Chloride: 103 mmol/L (ref 98–111)
Creatinine: 1.81 mg/dL — ABNORMAL HIGH (ref 0.44–1.00)
GFR, Estimated: 29 mL/min — ABNORMAL LOW (ref >60)
Glucose, Bld: 105 mg/dL — ABNORMAL HIGH (ref 70–99)
Potassium: 4.4 mmol/L (ref 3.5–5.1)
Sodium: 133 mmol/L — ABNORMAL LOW (ref 135–145)
Total Bilirubin: 0.5 mg/dL (ref 0.3–1.2)
Total Protein: 6.5 g/dL (ref 6.5–8.1)

## 2023-03-26 LAB — CBC WITH DIFFERENTIAL (CANCER CENTER ONLY)
Abs Immature Granulocytes: 0.03 10*3/uL (ref 0.00–0.07)
Basophils Absolute: 0 10*3/uL (ref 0.0–0.1)
Basophils Relative: 1 %
Eosinophils Absolute: 0.2 10*3/uL (ref 0.0–0.5)
Eosinophils Relative: 3 %
HCT: 33.2 % — ABNORMAL LOW (ref 36.0–46.0)
Hemoglobin: 11.2 g/dL — ABNORMAL LOW (ref 12.0–15.0)
Immature Granulocytes: 1 %
Lymphocytes Relative: 39 %
Lymphs Abs: 2.4 10*3/uL (ref 0.7–4.0)
MCH: 33.4 pg (ref 26.0–34.0)
MCHC: 33.7 g/dL (ref 30.0–36.0)
MCV: 99.1 fL (ref 80.0–100.0)
Monocytes Absolute: 0.5 10*3/uL (ref 0.1–1.0)
Monocytes Relative: 8 %
Neutro Abs: 3 10*3/uL (ref 1.7–7.7)
Neutrophils Relative %: 48 %
Platelet Count: 307 10*3/uL (ref 150–400)
RBC: 3.35 MIL/uL — ABNORMAL LOW (ref 3.87–5.11)
RDW: 15.1 % (ref 11.5–15.5)
WBC Count: 6.1 10*3/uL (ref 4.0–10.5)
nRBC: 0 % (ref 0.0–0.2)

## 2023-03-26 LAB — PHOSPHORUS: Phosphorus: 5.1 mg/dL — ABNORMAL HIGH (ref 2.5–4.6)

## 2023-03-26 MED ORDER — SODIUM CHLORIDE 0.9% FLUSH
10.0000 mL | Freq: Once | INTRAVENOUS | Status: AC
  Administered 2023-03-26: 10 mL

## 2023-03-26 MED ORDER — HEPARIN SOD (PORK) LOCK FLUSH 100 UNIT/ML IV SOLN
500.0000 [IU] | Freq: Once | INTRAVENOUS | Status: AC
  Administered 2023-03-26: 500 [IU]

## 2023-03-26 MED ORDER — SODIUM CHLORIDE 0.9 % IV SOLN: Freq: Once | INTRAVENOUS | Status: AC

## 2023-03-26 NOTE — Progress Notes (Signed)
[facility] [phone]   [phone]  OFFICE PROGRESS NOTE  Morgan Mires, MD [address] [place] [address]  DIAGNOSIS: Metastatic urothelial carcinoma that was initially diagnosed as stage II (T2, N0, M0) in September 2022 status post right nephroureterectomy as well as intravesical treatment and resection of superficial tumor in the bladder several times and March 2023 as well as July 2023.  The patient was found to have enlarging and new pulmonary nodules consistent with metastatic disease in November 2023.    PRIOR THERAPY: 1) Right nephroureterectomy as well as intravesical treatment and resection of superficial tumor in the bladder several times and March 2023 as well as July 2023.  2) Palliative systemic chemotherapy with carboplatin for AUC of 5 on day 1 and gemcitabine 1000 mg/M2 on days 1 and 8 every 3 weeks. First cycle June 21, 2022. Status post 6 cycles. Her dose of carboplatin was reduced to AUC of 4 and gemcitabine to 800 mg/m2 starting from cycle #5 due to cytopenias. 3) Maintenance immunotherapy with Avelumab 800 mg IV every 2 weeks. First dose on 11/15/22. Status post 6 cycle of treatment.   CURRENT THERAPY: Erdafitinib (balversa) 8 mg p.o daily. First dose 02/22/23  INTERVAL HISTORY: Morgan Torres 72 y.o. female returns to the clinic today for follow-up visit accompanied by her husband.Discussed the use of AI scribe software for clinical note transcription with the patient, who gave verbal consent to proceed.  History of Present Illness   Morgan Torres, a 72 year old [place] American female, was initially diagnosed with stage 2 urothelial carcinoma in September 2022. The disease progressed, and by November 2023, the tumor had started spreading. She underwent six rounds of chemotherapy with carboplatin and gemcitabine, followed by six cycles of immunotherapy with avelumab. Unfortunately, her cancer continued to grow, leading  to the initiation of erdafitinib, an oral drug, in August 2024.  Three weeks into the erdafitinib treatment, Morgan Torres reports experiencing significant lower back and right hip pain, which she is unsure if it is related to her pre-existing back problems. She also reports a significant loss of appetite, inability to taste or smell, and a weight loss of approximately four pounds over the past three weeks. She has been experiencing frequent diarrhea, including six episodes in one night, which she attributes to the erdafitinib. She denies any nausea or vomiting.  Morgan Torres also reports a decrease in her coughing symptoms. However, she has been experiencing severe pain in her legs and back, to the point where she can barely walk. She has a history of sciatica and has been managing the pain with tramadol, heat, and ice. She notes that the pain is alleviated when she is lying flat or has her legs raised, but is exacerbated when sitting or walking.  She has been taking amlodipine, metoprolol, and losartan for blood pressure management. She has been feeling generally unwell for the past couple of weeks.       MEDICAL HISTORY: Past Medical History:  Diagnosis Date   Anxiety    Arthritis    hip, lumbar spine    Asthma    seasonal allergies    Bronchitis    Hx: of   Chronic kidney disease    COPD (chronic obstructive pulmonary disease) (HCC)    Coronary artery disease    Depression    Fibromyalgia    Hypertension    Lumbar herniated disc    Myocardial infarction (HCC) 06/25/2012  followed by Dr. Rennis Golden, treated medically, no stents   Peripheral arterial disease (HCC)    of right foot   Pneumonia    Hx: of yrs ago   Sciatica    Sleep apnea    Small bowel obstruction (HCC)    several yrs ago    ALLERGIES:  is allergic to diclofenac, codeine, hydrocodone, and ciprofloxacin.  MEDICATIONS:  Current Outpatient Medications  Medication Sig Dispense Refill   acetaminophen (TYLENOL) 500 MG  tablet Take 1 tablet (500 mg total) by mouth every 6 (six) hours as needed for mild pain. 30 tablet 0   albuterol (PROVENTIL HFA;VENTOLIN HFA) 108 (90 Base) MCG/ACT inhaler Inhale 2 puffs into the lungs every 6 (six) hours as needed for wheezing or shortness of breath. 1 Inhaler 0   amLODipine (NORVASC) 5 MG tablet TAKE 1 TABLET BY MOUTH EVERY DAY 15 tablet 0   aspirin EC 81 MG tablet Take 81 mg by mouth daily. Swallow whole.     atorvastatin (LIPITOR) 20 MG tablet Take 20 mg by mouth. Monday wed and Friday in am     cilostazol (PLETAL) 100 MG tablet Take 100 mg by mouth 2 (two) times daily.     EPINEPHrine (EPIPEN 2-PAK) 0.3 mg/0.3 mL DEVI Inject 0.3 mLs (0.3 mg total) into the muscle once as needed (for severe allergic reaction). CAll 911 immediately if you have to use this medicine 1 Device 1   erdafitinib (BALVERSA) 4 MG tablet Take 2 tablets (8 mg total) by mouth daily. 60 tablet 3   HYDROcodone-acetaminophen (NORCO/VICODIN) 5-325 MG tablet Take 1 tablet by mouth 3 (three) times daily as needed. (Patient not taking: Reported on 02/19/2023)     levothyroxine (SYNTHROID) 50 MCG tablet TAKE 1 TABLET BY MOUTH DAILY BEFORE BREAKFAST 90 tablet 1   levothyroxine (SYNTHROID) 88 MCG tablet TAKE 1 TABLET BY MOUTH DAILY BEFORE BREAKFAST. 90 tablet 2   lidocaine-prilocaine (EMLA) cream Apply to the Port-A-Cath site 30-60-minute before treatment 30 g 2   losartan (COZAAR) 100 MG tablet Take 100 mg by mouth daily.     losartan-hydrochlorothiazide (HYZAAR) 100-12.5 MG tablet Take 1 tablet by mouth daily.     metoprolol tartrate (LOPRESSOR) 25 MG tablet Take 1 tablet (25 mg total) by mouth 2 (two) times daily. Pt must keep upcoming appt in August 2024 with Cardiologist before anymore refills. Thank you Final attempt 60 tablet 1   nicotine (NICODERM CQ - DOSED IN MG/24 HOURS) 21 mg/24hr patch Place 21 mg onto the skin daily.     nicotine polacrilex (NICORETTE) 4 MG gum Take 4 mg by mouth as needed for smoking  cessation.     nitroGLYCERIN (NITROSTAT) 0.4 MG SL tablet Place 1 tablet (0.4 mg total) under the tongue every 5 (five) minutes x 3 doses as needed for chest pain. 25 tablet 2   oxybutynin (DITROPAN) 5 MG tablet Take 1 tablet (5 mg total) by mouth every 8 (eight) hours as needed for bladder spasms. 30 tablet 1   polyethylene glycol (MIRALAX / GLYCOLAX) 17 g packet Take 17 g by mouth as needed.     prochlorperazine (COMPAZINE) 10 MG tablet Take 1 tablet (10 mg total) by mouth every 6 (six) hours as needed for nausea or vomiting. 30 tablet 0   temazepam (RESTORIL) 15 MG capsule Take 1 capsule (15 mg total) by mouth at bedtime as needed for sleep. 30 capsule 0   traMADol (ULTRAM) 50 MG tablet Take by mouth every 6 (six) hours as  needed. Not taking (Patient not taking: Reported on 02/19/2023)     No current facility-administered medications for this visit.    SURGICAL HISTORY:  Past Surgical History:  Procedure Laterality Date   ABDOMINAL SURGERY     resection of small intestine   ANTERIOR CRUCIATE LIGAMENT REPAIR Bilateral    BACK SURGERY     fusion of lumbar   CARDIAC CATHETERIZATION  06/25/2012   COLON SURGERY     resection for bowel - for obstruction  6 to 7 yrs ago per pt on 01-05-2022   COLONOSCOPY     Hx; of   DILATION AND CURETTAGE OF UTERUS     EYE SURGERY Bilateral    cataracts removed   HERNIA REPAIR  02/23/1969   inguinal hernia    IR IMAGING GUIDED PORT INSERTION  06/07/2022   LEFT HEART CATHETERIZATION WITH CORONARY ANGIOGRAM N/A 03/30/2013   Procedure: LEFT HEART CATHETERIZATION WITH CORONARY ANGIOGRAM;  Surgeon: Marykay Lex, MD;  Location: [facility] CATH LAB;  Service: Cardiovascular;  Laterality: N/A;   left thumb surgery     yrs ago   ROBOT ASSITED LAPAROSCOPIC NEPHROURETERECTOMY Left 03/09/2021   Procedure: XI ROBOT ASSITED LAPAROSCOPIC NEPHROURETERECTOMY/ CYSTOSCOPY WITH LEFT URETEROSCOPY WITH TRANSURETHRAL RESECTION OF URETERAL ORIFICE;  Surgeon: Rene Paci, MD;  Location: [facility] ORS;  Service: Urology;  Laterality: Left;   SALIVARY GLAND SURGERY Left    approached from inside mouth & side of neck 1970-1980   TONSILLECTOMY     as child   TOTAL HIP ARTHROPLASTY Left 05/28/2014   Procedure: LEFT TOTAL HIP ARTHROPLASTY;  Surgeon: Thera Flake., MD;  Location: [facility] OR;  Service: Orthopedics;  Laterality: Left;   TRANSURETHRAL RESECTION OF BLADDER TUMOR N/A 08/23/2021   Procedure: TRANSURETHRAL RESECTION OF BLADDER TUMOR (TURBT) WITH CYSTOSCOPY/ POSTOPERATIVE INSTILLATION OF GEMCITABINE/retrograde pyelogram and stent placement (right);  Surgeon: Rene Paci, MD;  Location: [facility];  Service: Urology;  Laterality: N/A;   TRANSURETHRAL RESECTION OF BLADDER TUMOR N/A 01/10/2022   Procedure: TRANSURETHRAL RESECTION OF BLADDER TUMOR (TURBT) WITH CYSTOSCOPY / GEMCITABINE INSTILLATION post-operatively;  Surgeon: Rene Paci, MD;  Location: [facility];  Service: Urology;  Laterality: N/A;  ONLY NEEDS 30 MIN    REVIEW OF SYSTEMS:  Constitutional: positive for anorexia, fatigue, and weight loss Eyes: negative Ears, nose, mouth, throat, and face: negative Respiratory: negative Cardiovascular: negative Gastrointestinal: positive for diarrhea Genitourinary:negative Integument/breast: negative Hematologic/lymphatic: negative Musculoskeletal:positive for arthralgias and back pain Neurological: negative Behavioral/Psych: negative Endocrine: negative Allergic/Immunologic: negative   PHYSICAL EXAMINATION: General appearance: alert, cooperative, fatigued, and no distress Head: Normocephalic, without obvious abnormality, atraumatic Neck: no adenopathy, no JVD, supple, symmetrical, trachea midline, and thyroid not enlarged, symmetric, no tenderness/mass/nodules Lymph nodes: Cervical, supraclavicular, and axillary nodes normal. Resp: clear to auscultation bilaterally Back: symmetric, no curvature.  ROM normal. No CVA tenderness. Cardio: regular rate and rhythm, S1, S2 normal, no murmur, click, rub or gallop GI: soft, non-tender; bowel sounds normal; no masses,  no organomegaly Extremities: extremities normal, atraumatic, no cyanosis or edema Neurologic: Alert and oriented X 3, normal strength and tone. Normal symmetric reflexes. Normal coordination and gait  ECOG PERFORMANCE STATUS: 1 - Symptomatic but completely ambulatory  Blood pressure (!) 88/57, pulse 65, temperature (!) 97.5 F (36.4 C), temperature source Oral, resp. rate 17, height 5\' 8"  (1.727 m), weight 174 lb (78.9 kg), SpO2 99%.  LABORATORY DATA: Lab Results  Component Value Date   WBC 6.1 03/07/2023   HGB  11.5 (L) 03/07/2023   HCT 34.2 (L) 03/07/2023   MCV 101.5 (H) 03/07/2023   PLT 239 03/07/2023      Chemistry      Component Value Date/Time   NA 133 (L) 03/07/2023 1504   K 5.0 03/07/2023 1504   CL 104 03/07/2023 1504   CO2 23 03/07/2023 1504   BUN 14 03/07/2023 1504   CREATININE 1.66 (H) 03/07/2023 1504      Component Value Date/Time   CALCIUM 8.9 03/07/2023 1504   ALKPHOS 86 03/07/2023 1504   AST 22 03/07/2023 1504   ALT 27 03/07/2023 1504   BILITOT 0.3 03/07/2023 1504       RADIOGRAPHIC STUDIES: No results found.  ASSESSMENT AND PLAN: This is a very pleasant 72 years old African-American female with metastatic urothelial carcinoma initially diagnosed in September 2022 as a stage II status post right nephro ureterectomy as well as intravesical treatment and resection of superficial tumor in the bladder for several times.  The patient was found to have evidence of metastatic disease in the lung in November 2023.  She underwent palliative systemic chemotherapy with carboplatin and gemcitabine status post 6 cycles with stable disease followed by maintenance treatment with immunotherapy with Avelumab 800 Mg IV every 2 weeks status post 6 cycles.  This treatment was discontinued secondary to disease  progression. The patient had repeat CT scan of the chest, abdomen and pelvis performed recently.  I personally and independently reviewed the scan and discussed the result with the patient today.  Unfortunately her scan showed evidence for disease progression with enlargement of bilateral pulmonary nodules. Her molecular studies showed positive FGFR mutation and the patient will be a good candidate for treatment with erdafitinib starting at a dose of 8 mg p.o. daily Assessment and Plan    Urothelial Carcinoma Progressive disease despite chemotherapy and immunotherapy. Recently started on Erdafitinib with potential side effects including back pain, loss of appetite, taste changes, and diarrhea. -Hold Erdafitinib for 3 days due to diarrhea. -Start Imodium for diarrhea. -Resume Erdafitinib at 8mg  daily if diarrhea improves. -Check labs in 2 weeks.  Lower Back Pain Likely sciatica, exacerbated by recent medication changes. -Continue Tramadol as needed. -Use heat and ice for symptom relief.  Hypertension Blood pressure low today, possibly due to antihypertensive medications and recent diarrhea. -Hold antihypertensive medications if systolic blood pressure is less than 100. -Check blood pressure at home regularly. -Administer 1 liter of normal saline in clinic today due to low blood pressure and diarrhea.  Taste Changes Likely secondary to Erdafitinib. -Encourage regular mouth cleaning and use of biotin. -Encourage continued eating despite taste changes.   She was advised to call immediately if she has any other concerning symptoms in the interval. The patient voices understanding of current disease status and treatment options and is in agreement with the current care plan.  All questions were answered. The patient knows to call the clinic with any problems, questions or concerns. We can certainly see the patient much sooner if necessary.  The total time spent in the appointment was 40  minutes.  Disclaimer: This note was dictated with voice recognition software. Similar sounding words can inadvertently be transcribed and may not be corrected upon review.

## 2023-03-26 NOTE — Patient Instructions (Signed)

## 2023-03-26 NOTE — Patient Instructions (Signed)

## 2023-03-27 ENCOUNTER — Telehealth: Payer: Self-pay | Admitting: Internal Medicine

## 2023-03-27 NOTE — Telephone Encounter (Signed)
Scheduled per 10/01 los, patient has been called and notified.

## 2023-03-29 ENCOUNTER — Other Ambulatory Visit: Payer: Self-pay | Admitting: Internal Medicine

## 2023-04-04 ENCOUNTER — Other Ambulatory Visit: Payer: Self-pay | Admitting: Internal Medicine

## 2023-04-04 ENCOUNTER — Ambulatory Visit: Payer: Medicare Other

## 2023-04-04 ENCOUNTER — Other Ambulatory Visit: Payer: Medicare Other

## 2023-04-04 ENCOUNTER — Ambulatory Visit: Payer: Medicare Other | Admitting: Internal Medicine

## 2023-04-09 ENCOUNTER — Inpatient Hospital Stay: Payer: Medicare Other

## 2023-04-09 ENCOUNTER — Inpatient Hospital Stay (HOSPITAL_BASED_OUTPATIENT_CLINIC_OR_DEPARTMENT_OTHER): Payer: Medicare Other | Admitting: Internal Medicine

## 2023-04-09 VITALS — BP 123/71 | HR 70 | Temp 97.5°F | Resp 16 | Ht 68.0 in | Wt 173.8 lb

## 2023-04-09 DIAGNOSIS — Z885 Allergy status to narcotic agent status: Secondary | ICD-10-CM | POA: Diagnosis not present

## 2023-04-09 DIAGNOSIS — C7800 Secondary malignant neoplasm of unspecified lung: Secondary | ICD-10-CM

## 2023-04-09 DIAGNOSIS — R634 Abnormal weight loss: Secondary | ICD-10-CM | POA: Diagnosis not present

## 2023-04-09 DIAGNOSIS — R197 Diarrhea, unspecified: Secondary | ICD-10-CM | POA: Diagnosis not present

## 2023-04-09 DIAGNOSIS — C679 Malignant neoplasm of bladder, unspecified: Secondary | ICD-10-CM | POA: Diagnosis not present

## 2023-04-09 DIAGNOSIS — M25551 Pain in right hip: Secondary | ICD-10-CM | POA: Diagnosis not present

## 2023-04-09 DIAGNOSIS — M545 Low back pain, unspecified: Secondary | ICD-10-CM | POA: Diagnosis not present

## 2023-04-09 DIAGNOSIS — C7910 Secondary malignant neoplasm of unspecified urinary organs: Secondary | ICD-10-CM

## 2023-04-09 DIAGNOSIS — M549 Dorsalgia, unspecified: Secondary | ICD-10-CM | POA: Diagnosis not present

## 2023-04-09 DIAGNOSIS — I252 Old myocardial infarction: Secondary | ICD-10-CM | POA: Diagnosis not present

## 2023-04-09 DIAGNOSIS — I2510 Atherosclerotic heart disease of native coronary artery without angina pectoris: Secondary | ICD-10-CM | POA: Diagnosis not present

## 2023-04-09 DIAGNOSIS — Z9089 Acquired absence of other organs: Secondary | ICD-10-CM | POA: Diagnosis not present

## 2023-04-09 DIAGNOSIS — Z7902 Long term (current) use of antithrombotics/antiplatelets: Secondary | ICD-10-CM | POA: Diagnosis not present

## 2023-04-09 DIAGNOSIS — I10 Essential (primary) hypertension: Secondary | ICD-10-CM | POA: Diagnosis not present

## 2023-04-09 DIAGNOSIS — Z881 Allergy status to other antibiotic agents status: Secondary | ICD-10-CM | POA: Diagnosis not present

## 2023-04-09 DIAGNOSIS — Z95828 Presence of other vascular implants and grafts: Secondary | ICD-10-CM

## 2023-04-09 DIAGNOSIS — D649 Anemia, unspecified: Secondary | ICD-10-CM | POA: Diagnosis not present

## 2023-04-09 DIAGNOSIS — R5383 Other fatigue: Secondary | ICD-10-CM | POA: Diagnosis not present

## 2023-04-09 DIAGNOSIS — Z79899 Other long term (current) drug therapy: Secondary | ICD-10-CM | POA: Diagnosis not present

## 2023-04-09 DIAGNOSIS — R059 Cough, unspecified: Secondary | ICD-10-CM | POA: Diagnosis not present

## 2023-04-09 LAB — CMP (CANCER CENTER ONLY)
ALT: 37 U/L (ref 0–44)
AST: 28 U/L (ref 15–41)
Albumin: 3.9 g/dL (ref 3.5–5.0)
Alkaline Phosphatase: 80 U/L (ref 38–126)
Anion gap: 9 (ref 5–15)
BUN: 14 mg/dL (ref 8–23)
CO2: 24 mmol/L (ref 22–32)
Calcium: 9.2 mg/dL (ref 8.9–10.3)
Chloride: 99 mmol/L (ref 98–111)
Creatinine: 1.68 mg/dL — ABNORMAL HIGH (ref 0.44–1.00)
GFR, Estimated: 32 mL/min — ABNORMAL LOW (ref >60)
Glucose, Bld: 115 mg/dL — ABNORMAL HIGH (ref 70–99)
Potassium: 4 mmol/L (ref 3.5–5.1)
Sodium: 132 mmol/L — ABNORMAL LOW (ref 135–145)
Total Bilirubin: 0.4 mg/dL (ref 0.3–1.2)
Total Protein: 6.8 g/dL (ref 6.5–8.1)

## 2023-04-09 LAB — CBC WITH DIFFERENTIAL (CANCER CENTER ONLY)
Abs Immature Granulocytes: 0.01 10*3/uL (ref 0.00–0.07)
Basophils Absolute: 0.1 10*3/uL (ref 0.0–0.1)
Basophils Relative: 1 %
Eosinophils Absolute: 0.2 10*3/uL (ref 0.0–0.5)
Eosinophils Relative: 3 %
HCT: 33.2 % — ABNORMAL LOW (ref 36.0–46.0)
Hemoglobin: 11.3 g/dL — ABNORMAL LOW (ref 12.0–15.0)
Immature Granulocytes: 0 %
Lymphocytes Relative: 38 %
Lymphs Abs: 2.4 10*3/uL (ref 0.7–4.0)
MCH: 34.2 pg — ABNORMAL HIGH (ref 26.0–34.0)
MCHC: 34 g/dL (ref 30.0–36.0)
MCV: 100.6 fL — ABNORMAL HIGH (ref 80.0–100.0)
Monocytes Absolute: 0.5 10*3/uL (ref 0.1–1.0)
Monocytes Relative: 8 %
Neutro Abs: 3.2 10*3/uL (ref 1.7–7.7)
Neutrophils Relative %: 50 %
Platelet Count: 262 10*3/uL (ref 150–400)
RBC: 3.3 MIL/uL — ABNORMAL LOW (ref 3.87–5.11)
RDW: 15.1 % (ref 11.5–15.5)
WBC Count: 6.4 10*3/uL (ref 4.0–10.5)
nRBC: 0 % (ref 0.0–0.2)

## 2023-04-09 LAB — PHOSPHORUS: Phosphorus: 4.9 mg/dL — ABNORMAL HIGH (ref 2.5–4.6)

## 2023-04-09 MED ORDER — HEPARIN SOD (PORK) LOCK FLUSH 100 UNIT/ML IV SOLN
500.0000 [IU] | Freq: Once | INTRAVENOUS | Status: AC
  Administered 2023-04-09: 500 [IU]

## 2023-04-09 MED ORDER — SODIUM CHLORIDE 0.9% FLUSH
10.0000 mL | Freq: Once | INTRAVENOUS | Status: AC
  Administered 2023-04-09: 10 mL

## 2023-04-09 NOTE — Progress Notes (Signed)
[facility] [phone]   [phone]  OFFICE PROGRESS NOTE  Morgan Mires, MD [address] [place] [address]  DIAGNOSIS: Metastatic urothelial carcinoma that was initially diagnosed as stage II (T2, N0, M0) in September 2022 status post right nephroureterectomy as well as intravesical treatment and resection of superficial tumor in the bladder several times and March 2023 as well as July 2023.  The patient was found to have enlarging and new pulmonary nodules consistent with metastatic disease in November 2023.    PRIOR THERAPY: 1) Right nephroureterectomy as well as intravesical treatment and resection of superficial tumor in the bladder several times and March 2023 as well as July 2023.  2) Palliative systemic chemotherapy with carboplatin for AUC of 5 on day 1 and gemcitabine 1000 mg/M2 on days 1 and 8 every 3 weeks. First cycle June 21, 2022. Status post 6 cycles. Her dose of carboplatin was reduced to AUC of 4 and gemcitabine to 800 mg/m2 starting from cycle #5 due to cytopenias. 3) Maintenance immunotherapy with Avelumab 800 mg IV every 2 weeks. First dose on 11/15/22. Status post 6 cycle of treatment.   CURRENT THERAPY: Erdafitinib (balversa) 8 mg p.o daily. First dose 02/22/23  INTERVAL HISTORY: Morgan Torres 72 y.o. female returns to the clinic today for follow-up visit accompanied by her granddaughter.  Discussed the use of AI scribe software for clinical note transcription with the patient, who gave verbal consent to proceed.  History of Present Illness   Morgan Torres, a 72 year old patient with a history of metastatic urothelial carcinoma, initially diagnosed as stage two in September 2022 and progressed to metastatic in November 2023, presents for follow-up. She is currently on an 8mg  daily regimen of Erdafitinib, an oral medication.  Two weeks prior, she reported several side effects including diarrhea, joint aches, and loss of  taste. Currently, the diarrhea has improved, and she has started taking the medication at night. However, she still experiences some joint pain. Her appetite is gradually returning, and her sense of taste is not as impaired as before. She has been using salt water and biotin as recommended, which seems to be helping.  She reports no significant weight loss since the last visit, with a weight of 173.8 lbs compared to 174 lbs previously. She expresses a lack of energy, which she notes is better than before, as she was able to walk into the clinic this time, unlike the previous visit when she needed a wheelchair. She has been diagnosed with anemia, which is likely contributing to her fatigue.  She paused her Erdafitinib treatment for three days two weeks ago and then resumed it. She reports no new side effects, although she still experiences some itching, particularly at the back of her head, which she manages with Benadryl. She has been on this treatment since August 2024, approximately six weeks ago.  She denies any swelling in her legs.      MEDICAL HISTORY: Past Medical History:  Diagnosis Date   Anxiety    Arthritis    hip, lumbar spine    Asthma    seasonal allergies    Bronchitis    Hx: of   Chronic kidney disease    COPD (chronic obstructive pulmonary disease) (HCC)    Coronary artery disease    Depression    Fibromyalgia    Hypertension    Lumbar herniated disc    Myocardial infarction (HCC) 06/25/2012   followed  by Dr. Rennis Golden, treated medically, no stents   Peripheral arterial disease (HCC)    of right foot   Pneumonia    Hx: of yrs ago   Sciatica    Sleep apnea    Small bowel obstruction (HCC)    several yrs ago    ALLERGIES:  is allergic to diclofenac, codeine, hydrocodone, and ciprofloxacin.  MEDICATIONS:  Current Outpatient Medications  Medication Sig Dispense Refill   acetaminophen (TYLENOL) 500 MG tablet Take 1 tablet (500 mg total) by mouth every 6 (six) hours  as needed for mild pain. 30 tablet 0   albuterol (PROVENTIL HFA;VENTOLIN HFA) 108 (90 Base) MCG/ACT inhaler Inhale 2 puffs into the lungs every 6 (six) hours as needed for wheezing or shortness of breath. 1 Inhaler 0   amLODipine (NORVASC) 5 MG tablet TAKE 1 TABLET BY MOUTH EVERY DAY 15 tablet 0   aspirin EC 81 MG tablet Take 81 mg by mouth daily. Swallow whole.     atorvastatin (LIPITOR) 20 MG tablet Take 20 mg by mouth. Monday wed and Friday in am     cilostazol (PLETAL) 100 MG tablet Take 100 mg by mouth 2 (two) times daily.     EPINEPHrine (EPIPEN 2-PAK) 0.3 mg/0.3 mL DEVI Inject 0.3 mLs (0.3 mg total) into the muscle once as needed (for severe allergic reaction). CAll 911 immediately if you have to use this medicine 1 Device 1   erdafitinib (BALVERSA) 4 MG tablet Take 2 tablets (8 mg total) by mouth daily. 60 tablet 3   HYDROcodone-acetaminophen (NORCO/VICODIN) 5-325 MG tablet Take 1 tablet by mouth 3 (three) times daily as needed. (Patient not taking: Reported on 02/19/2023)     levothyroxine (SYNTHROID) 50 MCG tablet TAKE 1 TABLET BY MOUTH DAILY BEFORE BREAKFAST 90 tablet 1   levothyroxine (SYNTHROID) 88 MCG tablet TAKE 1 TABLET BY MOUTH DAILY BEFORE BREAKFAST. 90 tablet 2   lidocaine-prilocaine (EMLA) cream Apply to the Port-A-Cath site 30-60-minute before treatment 30 g 2   losartan (COZAAR) 100 MG tablet Take 100 mg by mouth daily.     losartan-hydrochlorothiazide (HYZAAR) 100-12.5 MG tablet Take 1 tablet by mouth daily.     metoprolol tartrate (LOPRESSOR) 25 MG tablet TAKE 1 TABLET BY MOUTH TWICE A DAY 60 tablet 1   nicotine (NICODERM CQ - DOSED IN MG/24 HOURS) 21 mg/24hr patch Place 21 mg onto the skin daily.     nicotine polacrilex (NICORETTE) 4 MG gum Take 4 mg by mouth as needed for smoking cessation.     nitroGLYCERIN (NITROSTAT) 0.4 MG SL tablet Place 1 tablet (0.4 mg total) under the tongue every 5 (five) minutes x 3 doses as needed for chest pain. 25 tablet 2   oxybutynin  (DITROPAN) 5 MG tablet Take 1 tablet (5 mg total) by mouth every 8 (eight) hours as needed for bladder spasms. 30 tablet 1   polyethylene glycol (MIRALAX / GLYCOLAX) 17 g packet Take 17 g by mouth as needed.     prochlorperazine (COMPAZINE) 10 MG tablet Take 1 tablet (10 mg total) by mouth every 6 (six) hours as needed for nausea or vomiting. 30 tablet 0   temazepam (RESTORIL) 15 MG capsule Take 1 capsule (15 mg total) by mouth at bedtime as needed for sleep. 30 capsule 0   traMADol (ULTRAM) 50 MG tablet Take by mouth every 6 (six) hours as needed. Not taking (Patient not taking: Reported on 02/19/2023)     No current facility-administered medications for this visit.  SURGICAL HISTORY:  Past Surgical History:  Procedure Laterality Date   ABDOMINAL SURGERY     resection of small intestine   ANTERIOR CRUCIATE LIGAMENT REPAIR Bilateral    BACK SURGERY     fusion of lumbar   CARDIAC CATHETERIZATION  06/25/2012   COLON SURGERY     resection for bowel - for obstruction  6 to 7 yrs ago per pt on 01-05-2022   COLONOSCOPY     Hx; of   DILATION AND CURETTAGE OF UTERUS     EYE SURGERY Bilateral    cataracts removed   HERNIA REPAIR  02/23/1969   inguinal hernia    IR IMAGING GUIDED PORT INSERTION  06/07/2022   LEFT HEART CATHETERIZATION WITH CORONARY ANGIOGRAM N/A 03/30/2013   Procedure: LEFT HEART CATHETERIZATION WITH CORONARY ANGIOGRAM;  Surgeon: Marykay Lex, MD;  [facility];  Service: Cardiovascular;  Laterality: N/A;   left thumb surgery     yrs ago   ROBOT ASSITED LAPAROSCOPIC NEPHROURETERECTOMY Left 03/09/2021   Procedure: XI ROBOT ASSITED LAPAROSCOPIC NEPHROURETERECTOMY/ CYSTOSCOPY WITH LEFT URETEROSCOPY WITH TRANSURETHRAL RESECTION OF URETERAL ORIFICE;  Surgeon: Rene Paci, MD;  Location: [facility];  Service: Urology;  Laterality: Left;   SALIVARY GLAND SURGERY Left    approached from inside mouth & side of neck 1970-1980   TONSILLECTOMY     as child    TOTAL HIP ARTHROPLASTY Left 05/28/2014   Procedure: LEFT TOTAL HIP ARTHROPLASTY;  Surgeon: Thera Flake., MD;  Location: [facility] OR;  Service: Orthopedics;  Laterality: Left;   TRANSURETHRAL RESECTION OF BLADDER TUMOR N/A 08/23/2021   Procedure: TRANSURETHRAL RESECTION OF BLADDER TUMOR (TURBT) WITH CYSTOSCOPY/ POSTOPERATIVE INSTILLATION OF GEMCITABINE/retrograde pyelogram and stent placement (right);  Surgeon: Rene Paci, MD;  Location: [facility];  Service: Urology;  Laterality: N/A;   TRANSURETHRAL RESECTION OF BLADDER TUMOR N/A 01/10/2022   Procedure: TRANSURETHRAL RESECTION OF BLADDER TUMOR (TURBT) WITH CYSTOSCOPY / GEMCITABINE INSTILLATION post-operatively;  Surgeon: Rene Paci, MD;  Location: [facility];  Service: Urology;  Laterality: N/A;  ONLY NEEDS 30 MIN    REVIEW OF SYSTEMS:  A comprehensive review of systems was negative except for: Constitutional: positive for fatigue Gastrointestinal: positive for diarrhea Musculoskeletal: positive for arthralgias   PHYSICAL EXAMINATION: General appearance: alert, cooperative, fatigued, and no distress Head: Normocephalic, without obvious abnormality, atraumatic Neck: no adenopathy, no JVD, supple, symmetrical, trachea midline, and thyroid not enlarged, symmetric, no tenderness/mass/nodules Lymph nodes: Cervical, supraclavicular, and axillary nodes normal. Resp: clear to auscultation bilaterally Back: symmetric, no curvature. ROM normal. No CVA tenderness. Cardio: regular rate and rhythm, S1, S2 normal, no murmur, click, rub or gallop GI: soft, non-tender; bowel sounds normal; no masses,  no organomegaly Extremities: extremities normal, atraumatic, no cyanosis or edema  ECOG PERFORMANCE STATUS: 1 - Symptomatic but completely ambulatory  Blood pressure 123/71, pulse 70, temperature (!) 97.5 F (36.4 C), temperature source Oral, resp. rate 16, height 5\' 8"  (1.727 m), weight 173 lb 12.8 oz  (78.8 kg), SpO2 99%.  LABORATORY DATA: Lab Results  Component Value Date   WBC 6.4 04/09/2023   HGB 11.3 (L) 04/09/2023   HCT 33.2 (L) 04/09/2023   MCV 100.6 (H) 04/09/2023   PLT 262 04/09/2023      Chemistry      Component Value Date/Time   NA 132 (L) 04/09/2023 1010   K 4.0 04/09/2023 1010   CL 99 04/09/2023 1010   CO2 24 04/09/2023 1010   BUN  14 04/09/2023 1010   CREATININE 1.68 (H) 04/09/2023 1010      Component Value Date/Time   CALCIUM 9.2 04/09/2023 1010   ALKPHOS 80 04/09/2023 1010   AST 28 04/09/2023 1010   ALT 37 04/09/2023 1010   BILITOT 0.4 04/09/2023 1010       RADIOGRAPHIC STUDIES: No results found.  ASSESSMENT AND PLAN: This is a very pleasant 72 years old African-American female with metastatic urothelial carcinoma initially diagnosed in September 2022 as a stage II status post right nephro ureterectomy as well as intravesical treatment and resection of superficial tumor in the bladder for several times.  The patient was found to have evidence of metastatic disease in the lung in November 2023.  She underwent palliative systemic chemotherapy with carboplatin and gemcitabine status post 6 cycles with stable disease followed by maintenance treatment with immunotherapy with Avelumab 800 Mg IV every 2 weeks status post 6 cycles.  This treatment was discontinued secondary to disease progression. The patient had repeat CT scan of the chest, abdomen and pelvis performed recently.  I personally and independently reviewed the scan and discussed the result with the patient today.  Unfortunately her scan showed evidence for disease progression with enlargement of bilateral pulmonary nodules. Her molecular studies showed positive FGFR mutation and the patient will be a good candidate for treatment with erdafitinib starting at a dose of 8 mg p.o. daily    Metastatic Urothelial Carcinoma On erdafitinib 8mg  daily since August 2024. Diarrhea and joint pain have improved. Taste  is slowly returning. No new side effects reported. -Continue erdafitinib 8mg  daily. -Order imaging in two weeks to assess treatment response. -Follow-up appointment one week after imaging.  Anemia Likely secondary to kidney cancer. Contributing to fatigue but patient reports improved energy levels. -Start iron supplements. -Continue monitoring.  General Health Maintenance -Continue supportive measures for side effects of erdafitinib (salt water, biotin, Imodium, Benadryl as needed).     She was advised to call immediately if she has any other concerning symptoms in the interval. The patient voices understanding of current disease status and treatment options and is in agreement with the current care plan.  All questions were answered. The patient knows to call the clinic with any problems, questions or concerns. We can certainly see the patient much sooner if necessary.  The total time spent in the appointment was 20 minutes.  Disclaimer: This note was dictated with voice recognition software. Similar sounding words can inadvertently be transcribed and may not be corrected upon review.

## 2023-04-15 ENCOUNTER — Telehealth: Payer: Self-pay | Admitting: Internal Medicine

## 2023-04-15 NOTE — Telephone Encounter (Signed)
Scheduled per 10/15 los, patient has been called and notified.

## 2023-04-17 ENCOUNTER — Telehealth: Payer: Self-pay | Admitting: Medical Oncology

## 2023-04-17 NOTE — Telephone Encounter (Signed)
I gave pt the number to call to schedule CT scan.

## 2023-04-22 ENCOUNTER — Ambulatory Visit (HOSPITAL_COMMUNITY)
Admission: RE | Admit: 2023-04-22 | Discharge: 2023-04-22 | Disposition: A | Discharge status: Home / Self Care | Payer: Medicare Other | Source: Ambulatory Visit | Attending: Internal Medicine | Admitting: Internal Medicine

## 2023-04-22 DIAGNOSIS — C7800 Secondary malignant neoplasm of unspecified lung: Secondary | ICD-10-CM | POA: Insufficient documentation

## 2023-04-22 DIAGNOSIS — C679 Malignant neoplasm of bladder, unspecified: Secondary | ICD-10-CM | POA: Insufficient documentation

## 2023-04-23 ENCOUNTER — Other Ambulatory Visit: Payer: Self-pay | Admitting: Obstetrics and Gynecology

## 2023-04-23 ENCOUNTER — Other Ambulatory Visit: Payer: Self-pay | Admitting: Internal Medicine

## 2023-04-23 DIAGNOSIS — L723 Sebaceous cyst: Secondary | ICD-10-CM | POA: Diagnosis not present

## 2023-04-23 DIAGNOSIS — N6332 Unspecified lump in axillary tail of the left breast: Secondary | ICD-10-CM | POA: Diagnosis not present

## 2023-04-23 DIAGNOSIS — H04123 Dry eye syndrome of bilateral lacrimal glands: Secondary | ICD-10-CM | POA: Diagnosis not present

## 2023-04-23 DIAGNOSIS — H35713 Central serous chorioretinopathy, bilateral: Secondary | ICD-10-CM | POA: Diagnosis not present

## 2023-04-23 NOTE — Telephone Encounter (Signed)
Patient has had third request for amlodipine 5 mg. Patient's last visit was 01/26/21 and doesn't have one scheduled.

## 2023-04-24 ENCOUNTER — Telehealth: Payer: Self-pay | Admitting: Medical Oncology

## 2023-04-24 NOTE — Telephone Encounter (Signed)
"  Diarrhea for 3 days every 1-2 hours" . She is asking for IVF . She said she is drinking water , juice and ensure " a few glasses of liquid only because I have no taste"  She ran out of imodium several days ago.  I told her to drink another 1 liter of fluid  tonight -water ,gatorade  I asked her to ask get some imodium tonight and start ASAP and call back in am . He needs to go to ED if symptoms worsen.

## 2023-04-25 ENCOUNTER — Encounter: Payer: Self-pay | Admitting: Internal Medicine

## 2023-04-28 NOTE — Progress Notes (Unsigned)
[facility] OFFICE PROGRESS NOTE  Morgan Mires, MD [address] [place] [address]  DIAGNOSIS: Metastatic urothelial carcinoma that was initially diagnosed as stage II (T2, N0, M0) in September 2022 status post right nephroureterectomy as well as intravesical treatment and resection of superficial tumor in the bladder several times and March 2023 as well as July 2023.  The patient was found to have enlarging and new pulmonary nodules consistent with metastatic disease in November 2023.   PRIOR THERAPY: 1) Right nephroureterectomy as well as intravesical treatment and resection of superficial tumor in the bladder several times and March 2023 as well as July 2023.  2) Palliative systemic chemotherapy with carboplatin for AUC of 5 on day 1 and gemcitabine 1000 mg/M2 on days 1 and 8 every 3 weeks. First cycle June 21, 2022. Status post 6 cycles. Her dose of carboplatin was reduced to AUC of 4 and gemcitabine to 800 mg/m2 starting from cycle #5 due to cytopenias. 3) Maintenance immunotherapy with Avelumab 800 mg IV every 2 weeks. First dose on 11/15/22. Status post 6 cycle of treatment.  CURRENT THERAPY: Erdafitinib (balversa) 8 mg p.o daily. First dose 02/22/23.   INTERVAL HISTORY: Morgan Torres 72 y.o. female returns to the clinic for a follow up visit. The patient is feeling ** today without any concerning complaints except for ***. When she wast last seen on 04/09/23, she was endorsing joint pain, loss of taste, and diarrhea. She takes *** for diarrhea. Her appetite is slowing improving and her weight is stable. Her sense of taste is improving. The patient continues to tolerate treatment with __ well without any adverse effects. Denies any fever, chills, night sweats, or weight loss. Denies any chest pain, shortness of breath, cough, or hemoptysis. Denies any nausea, vomiting, diarrhea, or constipation. Denies any headache or visual changes. Her next eye exam is scheduled for  ***. She takes Restoril for insomnia. She has been having ongoing issues with her chronic back pain which is what keeps her up at night.  She has been following with orthopedics.  She has had epidural injections which were not effective.  They recommended referral to pain clinic but it was in [facility] and she was not interested in that.  She follows with Dewaine Conger.  She takes tramadol for pain. Denies any rashes or skin changes. She recently had a restaging CT scan. The patient is here today for evaluation, repeat blood work, and to review her scan results.     MEDICAL HISTORY: Past Medical History:  Diagnosis Date   Anxiety    Arthritis    hip, lumbar spine    Asthma    seasonal allergies    Bronchitis    Hx: of   Chronic kidney disease    COPD (chronic obstructive pulmonary disease) (HCC)    Coronary artery disease    Depression    Fibromyalgia    Hypertension    Lumbar herniated disc    Myocardial infarction (HCC) 06/25/2012   followed by Dr. Rennis Golden, treated medically, no stents   Peripheral arterial disease (HCC)    of right foot   Pneumonia    Hx: of yrs ago   Sciatica    Sleep apnea    Small bowel obstruction (HCC)    several yrs ago    ALLERGIES:  is allergic to diclofenac, codeine, hydrocodone, and ciprofloxacin.  MEDICATIONS:  Current Outpatient Medications  Medication Sig Dispense Refill   acetaminophen (TYLENOL) 500  MG tablet Take 1 tablet (500 mg total) by mouth every 6 (six) hours as needed for mild pain. 30 tablet 0   albuterol (PROVENTIL HFA;VENTOLIN HFA) 108 (90 Base) MCG/ACT inhaler Inhale 2 puffs into the lungs every 6 (six) hours as needed for wheezing or shortness of breath. 1 Inhaler 0   amLODipine (NORVASC) 5 MG tablet Take 1 tablet (5 mg total) by mouth daily. <PLEASE MAKE APPOINTMENT FOR REFILLS> 15 tablet 0   aspirin EC 81 MG tablet Take 81 mg by mouth daily. Swallow whole.     atorvastatin (LIPITOR) 20 MG tablet Take 20 mg by mouth. Monday wed  and Friday in am     cilostazol (PLETAL) 100 MG tablet Take 100 mg by mouth 2 (two) times daily.     EPINEPHrine (EPIPEN 2-PAK) 0.3 mg/0.3 mL DEVI Inject 0.3 mLs (0.3 mg total) into the muscle once as needed (for severe allergic reaction). CAll 911 immediately if you have to use this medicine 1 Device 1   erdafitinib (BALVERSA) 4 MG tablet Take 2 tablets (8 mg total) by mouth daily. 60 tablet 3   HYDROcodone-acetaminophen (NORCO/VICODIN) 5-325 MG tablet Take 1 tablet by mouth 3 (three) times daily as needed. (Patient not taking: Reported on 02/19/2023)     levothyroxine (SYNTHROID) 50 MCG tablet TAKE 1 TABLET BY MOUTH DAILY BEFORE BREAKFAST 90 tablet 1   levothyroxine (SYNTHROID) 88 MCG tablet TAKE 1 TABLET BY MOUTH DAILY BEFORE BREAKFAST. 90 tablet 2   lidocaine-prilocaine (EMLA) cream Apply to the Port-A-Cath site 30-60-minute before treatment 30 g 2   losartan (COZAAR) 100 MG tablet Take 100 mg by mouth daily.     losartan-hydrochlorothiazide (HYZAAR) 100-12.5 MG tablet Take 1 tablet by mouth daily.     metoprolol tartrate (LOPRESSOR) 25 MG tablet TAKE 1 TABLET BY MOUTH TWICE A DAY 60 tablet 1   nicotine (NICODERM CQ - DOSED IN MG/24 HOURS) 21 mg/24hr patch Place 21 mg onto the skin daily.     nicotine polacrilex (NICORETTE) 4 MG gum Take 4 mg by mouth as needed for smoking cessation.     nitroGLYCERIN (NITROSTAT) 0.4 MG SL tablet Place 1 tablet (0.4 mg total) under the tongue every 5 (five) minutes x 3 doses as needed for chest pain. 25 tablet 2   oxybutynin (DITROPAN) 5 MG tablet Take 1 tablet (5 mg total) by mouth every 8 (eight) hours as needed for bladder spasms. 30 tablet 1   polyethylene glycol (MIRALAX / GLYCOLAX) 17 g packet Take 17 g by mouth as needed.     prochlorperazine (COMPAZINE) 10 MG tablet Take 1 tablet (10 mg total) by mouth every 6 (six) hours as needed for nausea or vomiting. 30 tablet 0   temazepam (RESTORIL) 15 MG capsule Take 1 capsule (15 mg total) by mouth at bedtime as  needed for sleep. 30 capsule 0   traMADol (ULTRAM) 50 MG tablet Take by mouth every 6 (six) hours as needed. Not taking (Patient not taking: Reported on 02/19/2023)     No current facility-administered medications for this visit.    SURGICAL HISTORY:  Past Surgical History:  Procedure Laterality Date   ABDOMINAL SURGERY     resection of small intestine   ANTERIOR CRUCIATE LIGAMENT REPAIR Bilateral    BACK SURGERY     fusion of lumbar   CARDIAC CATHETERIZATION  06/25/2012   COLON SURGERY     resection for bowel - for obstruction  6 to 7 yrs ago per pt on 01-05-2022  COLONOSCOPY     Hx; of   DILATION AND CURETTAGE OF UTERUS     EYE SURGERY Bilateral    cataracts removed   HERNIA REPAIR  02/23/1969   inguinal hernia    IR IMAGING GUIDED PORT INSERTION  06/07/2022   LEFT HEART CATHETERIZATION WITH CORONARY ANGIOGRAM N/A 03/30/2013   Procedure: LEFT HEART CATHETERIZATION WITH CORONARY ANGIOGRAM;  Surgeon: Marykay Lex, MD;  Location: [facility] CATH LAB;  Service: Cardiovascular;  Laterality: N/A;   left thumb surgery     yrs ago   ROBOT ASSITED LAPAROSCOPIC NEPHROURETERECTOMY Left 03/09/2021   Procedure: XI ROBOT ASSITED LAPAROSCOPIC NEPHROURETERECTOMY/ CYSTOSCOPY WITH LEFT URETEROSCOPY WITH TRANSURETHRAL RESECTION OF URETERAL ORIFICE;  Surgeon: Rene Paci, MD;  Location: [facility] ORS;  Service: Urology;  Laterality: Left;   SALIVARY GLAND SURGERY Left    approached from inside mouth & side of neck 1970-1980   TONSILLECTOMY     as child   TOTAL HIP ARTHROPLASTY Left 05/28/2014   Procedure: LEFT TOTAL HIP ARTHROPLASTY;  Surgeon: Thera Flake., MD;  Location: [facility] OR;  Service: Orthopedics;  Laterality: Left;   TRANSURETHRAL RESECTION OF BLADDER TUMOR N/A 08/23/2021   Procedure: TRANSURETHRAL RESECTION OF BLADDER TUMOR (TURBT) WITH CYSTOSCOPY/ POSTOPERATIVE INSTILLATION OF GEMCITABINE/retrograde pyelogram and stent placement (right);  Surgeon: Rene Paci, MD;   Location: [facility];  Service: Urology;  Laterality: N/A;   TRANSURETHRAL RESECTION OF BLADDER TUMOR N/A 01/10/2022   Procedure: TRANSURETHRAL RESECTION OF BLADDER TUMOR (TURBT) WITH CYSTOSCOPY / GEMCITABINE INSTILLATION post-operatively;  Surgeon: Rene Paci, MD;  Location: [facility];  Service: Urology;  Laterality: N/A;  ONLY NEEDS 30 MIN    REVIEW OF SYSTEMS:   Review of Systems  Constitutional: Negative for appetite change, chills, fatigue, fever and unexpected weight change.  HENT:   Negative for mouth sores, nosebleeds, sore throat and trouble swallowing.   Eyes: Negative for eye problems and icterus.  Respiratory: Negative for cough, hemoptysis, shortness of breath and wheezing.   Cardiovascular: Negative for chest pain and leg swelling.  Gastrointestinal: Negative for abdominal pain, constipation, diarrhea, nausea and vomiting.  Genitourinary: Negative for bladder incontinence, difficulty urinating, dysuria, frequency and hematuria.   Musculoskeletal: Negative for back pain, gait problem, neck pain and neck stiffness.  Skin: Negative for itching and rash.  Neurological: Negative for dizziness, extremity weakness, gait problem, headaches, light-headedness and seizures.  Hematological: Negative for adenopathy. Does not bruise/bleed easily.  Psychiatric/Behavioral: Negative for confusion, depression and sleep disturbance. The patient is not nervous/anxious.     PHYSICAL EXAMINATION:  There were no vitals taken for this visit.  ECOG PERFORMANCE STATUS: {CHL ONC ECOG [id number]  Physical Exam  Constitutional: Oriented to person, place, and time and well-developed, well-nourished, and in no distress. No distress.  HENT:  Head: Normocephalic and atraumatic.  Mouth/Throat: Oropharynx is clear and moist. No oropharyngeal exudate.  Eyes: Conjunctivae are normal. Right eye exhibits no discharge. Left eye exhibits no discharge. No scleral  icterus.  Neck: Normal range of motion. Neck supple.  Cardiovascular: Normal rate, regular rhythm, normal heart sounds and intact distal pulses.   Pulmonary/Chest: Effort normal and breath sounds normal. No respiratory distress. No wheezes. No rales.  Abdominal: Soft. Bowel sounds are normal. Exhibits no distension and no mass. There is no tenderness.  Musculoskeletal: Normal range of motion. Exhibits no edema.  Lymphadenopathy:    No cervical adenopathy.  Neurological: Alert and oriented to person, place, and time. Exhibits normal muscle tone.  Gait normal. Coordination normal.  Skin: Skin is warm and dry. No rash noted. Not diaphoretic. No erythema. No pallor.  Psychiatric: Mood, memory and judgment normal.  Vitals reviewed.  LABORATORY DATA: Lab Results  Component Value Date   WBC 6.4 04/09/2023   HGB 11.3 (L) 04/09/2023   HCT 33.2 (L) 04/09/2023   MCV 100.6 (H) 04/09/2023   PLT 262 04/09/2023      Chemistry      Component Value Date/Time   NA 132 (L) 04/09/2023 1010   K 4.0 04/09/2023 1010   CL 99 04/09/2023 1010   CO2 24 04/09/2023 1010   BUN 14 04/09/2023 1010   CREATININE 1.68 (H) 04/09/2023 1010      Component Value Date/Time   CALCIUM 9.2 04/09/2023 1010   ALKPHOS 80 04/09/2023 1010   AST 28 04/09/2023 1010   ALT 37 04/09/2023 1010   BILITOT 0.4 04/09/2023 1010       RADIOGRAPHIC STUDIES:  No results found.   ASSESSMENT/PLAN:  This is a very pleasant 72 year old African-American female with metastatic urothelial carcinoma.  She was initially diagnosed as a stage II (T2, N0, M0) in September 2022.    She is status post right nephroureterectomy as well as  intravesical treatment and resection of superficial tumor in the bladder several times and March 2023 as well as July 2023.  The patient was found to have enlarging and new pulmonary nodules consistent with metastatic disease in November 2023.   Dr. Arbutus Ped sent her tissue for foundation 1 and PD-L1  expression. She does not have any actionable mutations   She completed 6 cycles of systemic chemotherapy with carboplatin for an AUC of 5 on day 1 and gemcitabine 1000 milligrams per meter squared on days 1 and 8 IV every 3 weeks with Neulasta support.  Her dose was reduced to carboplatin for an AUC of 4 and gemcitabine 800 mg/m due to cytopenias transfusion support.   Dr. Arbutus Ped recommended maintenance immunotherapy with Avelumab 2 mg IV every 2 weeks.  She is status post 6 cycles of treatment and she tolerated it well without any concerning adverse side.    She had repeat imaging in August 2024 showing interval enlargement of bilateral pulmonary nodules consistent with progressive metastatic disease.    She started on EGFR therapy with Balversa 8 milligrams p.o. daily. She started this on 02/22/23. Thus far, she has tolerated this well   The patient was seen with Dr. Arbutus Ped today.  Dr. Arbutus Ped personally and independently reviewed the scan and discussed results with the patient today.  The scan showed ***.  Dr. Arbutus Ped recommends ***    Labs were reviewed. Recommend she continue on the same treatment at the same dose.   We will see her back in 4-6 weeks for evaluation and repeat blood work with CBC, phosphorus, and CMP.     She will continue with the social work team for counseling.    She knows to obtain eye exam monthly for 4 months, then every 3 months there on after.  I wrote this down for her again today.  She is scheduled for an eye exam on ***   She will follow-up with orthopedics regarding her back.   For taste alterations she will use salt water rinses and Biotene.  She will continue drinking Ensure   She will continue taking Restoril if needed for insomnia.   For her itchy scalp, she was instructed to take an antihistamine.  She knows that Benadryl may  make her drowsy.  She can try Claritin, Zyrtec, or Allegra.  If this is not effective in controlling her itch, then she can  take Benadryl at bedtime.  She does notice most of her itching is at nighttime.  She was also instructed to use head and shoulders shampoo.***  No orders of the defined types were placed in this encounter.    I spent {CHL ONC TIME VISIT - WGNFA:[id number]} counseling the patient face to face. The total time spent in the appointment was {CHL ONC TIME VISIT - ONGEX:[id number]}.  Seth Friedlander L Markala Sitts, PA-C 04/28/23

## 2023-04-29 DIAGNOSIS — Z720 Tobacco use: Secondary | ICD-10-CM | POA: Diagnosis not present

## 2023-04-29 DIAGNOSIS — J449 Chronic obstructive pulmonary disease, unspecified: Secondary | ICD-10-CM | POA: Diagnosis not present

## 2023-04-29 DIAGNOSIS — C679 Malignant neoplasm of bladder, unspecified: Secondary | ICD-10-CM | POA: Diagnosis not present

## 2023-04-29 DIAGNOSIS — Z0001 Encounter for general adult medical examination with abnormal findings: Secondary | ICD-10-CM | POA: Diagnosis not present

## 2023-04-29 DIAGNOSIS — I10 Essential (primary) hypertension: Secondary | ICD-10-CM | POA: Diagnosis not present

## 2023-04-29 DIAGNOSIS — G4733 Obstructive sleep apnea (adult) (pediatric): Secondary | ICD-10-CM | POA: Diagnosis not present

## 2023-04-29 DIAGNOSIS — C7910 Secondary malignant neoplasm of unspecified urinary organs: Secondary | ICD-10-CM | POA: Diagnosis not present

## 2023-04-30 ENCOUNTER — Telehealth: Payer: Self-pay

## 2023-04-30 ENCOUNTER — Other Ambulatory Visit: Payer: Self-pay | Admitting: Physician Assistant

## 2023-04-30 ENCOUNTER — Inpatient Hospital Stay: Payer: Medicare Other | Attending: Internal Medicine | Admitting: Physician Assistant

## 2023-04-30 VITALS — BP 148/71 | HR 62 | Temp 98.2°F | Resp 13 | Wt 172.4 lb

## 2023-04-30 DIAGNOSIS — G620 Drug-induced polyneuropathy: Secondary | ICD-10-CM | POA: Insufficient documentation

## 2023-04-30 DIAGNOSIS — Z5111 Encounter for antineoplastic chemotherapy: Secondary | ICD-10-CM

## 2023-04-30 DIAGNOSIS — R7989 Other specified abnormal findings of blood chemistry: Secondary | ICD-10-CM

## 2023-04-30 DIAGNOSIS — G8929 Other chronic pain: Secondary | ICD-10-CM | POA: Insufficient documentation

## 2023-04-30 DIAGNOSIS — Z885 Allergy status to narcotic agent status: Secondary | ICD-10-CM | POA: Diagnosis not present

## 2023-04-30 DIAGNOSIS — I252 Old myocardial infarction: Secondary | ICD-10-CM | POA: Insufficient documentation

## 2023-04-30 DIAGNOSIS — D649 Anemia, unspecified: Secondary | ICD-10-CM | POA: Diagnosis not present

## 2023-04-30 DIAGNOSIS — C679 Malignant neoplasm of bladder, unspecified: Secondary | ICD-10-CM | POA: Insufficient documentation

## 2023-04-30 DIAGNOSIS — C7800 Secondary malignant neoplasm of unspecified lung: Secondary | ICD-10-CM | POA: Diagnosis not present

## 2023-04-30 DIAGNOSIS — R9230 Dense breasts, unspecified: Secondary | ICD-10-CM | POA: Diagnosis not present

## 2023-04-30 DIAGNOSIS — T451X5A Adverse effect of antineoplastic and immunosuppressive drugs, initial encounter: Secondary | ICD-10-CM | POA: Diagnosis not present

## 2023-04-30 DIAGNOSIS — Z79899 Other long term (current) drug therapy: Secondary | ICD-10-CM | POA: Insufficient documentation

## 2023-04-30 DIAGNOSIS — F32A Depression, unspecified: Secondary | ICD-10-CM | POA: Diagnosis not present

## 2023-04-30 DIAGNOSIS — C7910 Secondary malignant neoplasm of unspecified urinary organs: Secondary | ICD-10-CM

## 2023-04-30 DIAGNOSIS — Z803 Family history of malignant neoplasm of breast: Secondary | ICD-10-CM | POA: Insufficient documentation

## 2023-04-30 DIAGNOSIS — Z881 Allergy status to other antibiotic agents status: Secondary | ICD-10-CM | POA: Diagnosis not present

## 2023-04-30 DIAGNOSIS — G4700 Insomnia, unspecified: Secondary | ICD-10-CM | POA: Diagnosis not present

## 2023-04-30 DIAGNOSIS — M549 Dorsalgia, unspecified: Secondary | ICD-10-CM | POA: Insufficient documentation

## 2023-04-30 DIAGNOSIS — M47816 Spondylosis without myelopathy or radiculopathy, lumbar region: Secondary | ICD-10-CM | POA: Insufficient documentation

## 2023-04-30 DIAGNOSIS — R5383 Other fatigue: Secondary | ICD-10-CM | POA: Insufficient documentation

## 2023-04-30 DIAGNOSIS — L989 Disorder of the skin and subcutaneous tissue, unspecified: Secondary | ICD-10-CM | POA: Diagnosis not present

## 2023-04-30 DIAGNOSIS — M5441 Lumbago with sciatica, right side: Secondary | ICD-10-CM | POA: Diagnosis not present

## 2023-04-30 DIAGNOSIS — Z7902 Long term (current) use of antithrombotics/antiplatelets: Secondary | ICD-10-CM | POA: Diagnosis not present

## 2023-04-30 DIAGNOSIS — Z905 Acquired absence of kidney: Secondary | ICD-10-CM | POA: Diagnosis not present

## 2023-04-30 DIAGNOSIS — I700 Atherosclerosis of aorta: Secondary | ICD-10-CM | POA: Diagnosis not present

## 2023-04-30 LAB — CBC WITH DIFFERENTIAL (CANCER CENTER ONLY)
Abs Immature Granulocytes: 0.07 10*3/uL (ref 0.00–0.07)
Basophils Absolute: 0 10*3/uL (ref 0.0–0.1)
Basophils Relative: 1 %
Eosinophils Absolute: 0.2 10*3/uL (ref 0.0–0.5)
Eosinophils Relative: 3 %
HCT: 28.5 % — ABNORMAL LOW (ref 36.0–46.0)
Hemoglobin: 10 g/dL — ABNORMAL LOW (ref 12.0–15.0)
Immature Granulocytes: 1 %
Lymphocytes Relative: 42 %
Lymphs Abs: 2.8 10*3/uL (ref 0.7–4.0)
MCH: 34.6 pg — ABNORMAL HIGH (ref 26.0–34.0)
MCHC: 35.1 g/dL (ref 30.0–36.0)
MCV: 98.6 fL (ref 80.0–100.0)
Monocytes Absolute: 0.5 10*3/uL (ref 0.1–1.0)
Monocytes Relative: 7 %
Neutro Abs: 3.1 10*3/uL (ref 1.7–7.7)
Neutrophils Relative %: 46 %
Platelet Count: 264 10*3/uL (ref 150–400)
RBC: 2.89 MIL/uL — ABNORMAL LOW (ref 3.87–5.11)
RDW: 14.5 % (ref 11.5–15.5)
WBC Count: 6.7 10*3/uL (ref 4.0–10.5)
nRBC: 0 % (ref 0.0–0.2)

## 2023-04-30 LAB — CMP (CANCER CENTER ONLY)
ALT: 21 U/L (ref 0–44)
AST: 28 U/L (ref 15–41)
Albumin: 3.9 g/dL (ref 3.5–5.0)
Alkaline Phosphatase: 74 U/L (ref 38–126)
Anion gap: 7 (ref 5–15)
BUN: 18 mg/dL (ref 8–23)
CO2: 24 mmol/L (ref 22–32)
Calcium: 9.4 mg/dL (ref 8.9–10.3)
Chloride: 98 mmol/L (ref 98–111)
Creatinine: 2.24 mg/dL — ABNORMAL HIGH (ref 0.44–1.00)
GFR, Estimated: 23 mL/min — ABNORMAL LOW (ref >60)
Glucose, Bld: 82 mg/dL (ref 70–99)
Potassium: 4.5 mmol/L (ref 3.5–5.1)
Sodium: 129 mmol/L — ABNORMAL LOW (ref 135–145)
Total Bilirubin: 0.3 mg/dL (ref <1.2)
Total Protein: 6.5 g/dL (ref 6.5–8.1)

## 2023-04-30 LAB — PHOSPHORUS: Phosphorus: 5.7 mg/dL — ABNORMAL HIGH (ref 2.5–4.6)

## 2023-04-30 MED ORDER — CALCIUM ACETATE (PHOS BINDER) 667 MG PO TABS: 667.0000 mg | ORAL_TABLET | Freq: Three times a day (TID) | ORAL | 0 refills | Status: DC

## 2023-04-30 MED ORDER — METHYLPREDNISOLONE 4 MG PO TBPK: ORAL_TABLET | ORAL | 0 refills | Status: DC

## 2023-04-30 NOTE — Telephone Encounter (Signed)
Spoke with pt regarding labs drew today.  Per Cassie, PA-  Phosphorous is high.  Sending in prescription called [facility] to [facility]. Informed pt to take 3 times a day with meals. Also, kidney function is elevated.  Informed pt to drinks lots of fluids.  Will recheck labs next week.  Appt is made. Told pt to call with any concerns or questions.

## 2023-04-30 NOTE — Progress Notes (Signed)
Faxed labs that were obtained today through [facility] to [name], MD.

## 2023-05-03 ENCOUNTER — Other Ambulatory Visit: Payer: Self-pay | Admitting: Cardiology

## 2023-05-06 ENCOUNTER — Other Ambulatory Visit: Payer: Self-pay | Admitting: Physician Assistant

## 2023-05-06 ENCOUNTER — Other Ambulatory Visit: Payer: Self-pay

## 2023-05-06 ENCOUNTER — Inpatient Hospital Stay: Payer: Medicare Other

## 2023-05-06 VITALS — BP 101/49 | HR 51 | Temp 97.7°F | Resp 16

## 2023-05-06 DIAGNOSIS — Z881 Allergy status to other antibiotic agents status: Secondary | ICD-10-CM | POA: Diagnosis not present

## 2023-05-06 DIAGNOSIS — G8929 Other chronic pain: Secondary | ICD-10-CM | POA: Diagnosis not present

## 2023-05-06 DIAGNOSIS — Z79899 Other long term (current) drug therapy: Secondary | ICD-10-CM | POA: Diagnosis not present

## 2023-05-06 DIAGNOSIS — C679 Malignant neoplasm of bladder, unspecified: Secondary | ICD-10-CM | POA: Diagnosis not present

## 2023-05-06 DIAGNOSIS — R7989 Other specified abnormal findings of blood chemistry: Secondary | ICD-10-CM

## 2023-05-06 DIAGNOSIS — D649 Anemia, unspecified: Secondary | ICD-10-CM | POA: Diagnosis not present

## 2023-05-06 DIAGNOSIS — E871 Hypo-osmolality and hyponatremia: Secondary | ICD-10-CM

## 2023-05-06 DIAGNOSIS — Z885 Allergy status to narcotic agent status: Secondary | ICD-10-CM | POA: Diagnosis not present

## 2023-05-06 DIAGNOSIS — Z905 Acquired absence of kidney: Secondary | ICD-10-CM | POA: Diagnosis not present

## 2023-05-06 DIAGNOSIS — Z5111 Encounter for antineoplastic chemotherapy: Secondary | ICD-10-CM

## 2023-05-06 DIAGNOSIS — Z7902 Long term (current) use of antithrombotics/antiplatelets: Secondary | ICD-10-CM | POA: Diagnosis not present

## 2023-05-06 DIAGNOSIS — C7800 Secondary malignant neoplasm of unspecified lung: Secondary | ICD-10-CM | POA: Diagnosis not present

## 2023-05-06 DIAGNOSIS — I700 Atherosclerosis of aorta: Secondary | ICD-10-CM | POA: Diagnosis not present

## 2023-05-06 DIAGNOSIS — G4700 Insomnia, unspecified: Secondary | ICD-10-CM | POA: Diagnosis not present

## 2023-05-06 DIAGNOSIS — R5383 Other fatigue: Secondary | ICD-10-CM | POA: Diagnosis not present

## 2023-05-06 DIAGNOSIS — M549 Dorsalgia, unspecified: Secondary | ICD-10-CM | POA: Diagnosis not present

## 2023-05-06 DIAGNOSIS — G620 Drug-induced polyneuropathy: Secondary | ICD-10-CM | POA: Diagnosis not present

## 2023-05-06 DIAGNOSIS — Z803 Family history of malignant neoplasm of breast: Secondary | ICD-10-CM | POA: Diagnosis not present

## 2023-05-06 DIAGNOSIS — C7910 Secondary malignant neoplasm of unspecified urinary organs: Secondary | ICD-10-CM

## 2023-05-06 DIAGNOSIS — L989 Disorder of the skin and subcutaneous tissue, unspecified: Secondary | ICD-10-CM | POA: Diagnosis not present

## 2023-05-06 DIAGNOSIS — M47816 Spondylosis without myelopathy or radiculopathy, lumbar region: Secondary | ICD-10-CM | POA: Diagnosis not present

## 2023-05-06 DIAGNOSIS — T451X5A Adverse effect of antineoplastic and immunosuppressive drugs, initial encounter: Secondary | ICD-10-CM | POA: Diagnosis not present

## 2023-05-06 DIAGNOSIS — Z95828 Presence of other vascular implants and grafts: Secondary | ICD-10-CM

## 2023-05-06 DIAGNOSIS — R9230 Dense breasts, unspecified: Secondary | ICD-10-CM | POA: Diagnosis not present

## 2023-05-06 DIAGNOSIS — I252 Old myocardial infarction: Secondary | ICD-10-CM | POA: Diagnosis not present

## 2023-05-06 LAB — CBC WITH DIFFERENTIAL (CANCER CENTER ONLY)
Abs Immature Granulocytes: 0.04 10*3/uL (ref 0.00–0.07)
Basophils Absolute: 0 10*3/uL (ref 0.0–0.1)
Basophils Relative: 0 %
Eosinophils Absolute: 0.2 10*3/uL (ref 0.0–0.5)
Eosinophils Relative: 2 %
HCT: 30.9 % — ABNORMAL LOW (ref 36.0–46.0)
Hemoglobin: 10.9 g/dL — ABNORMAL LOW (ref 12.0–15.0)
Immature Granulocytes: 1 %
Lymphocytes Relative: 42 %
Lymphs Abs: 2.9 10*3/uL (ref 0.7–4.0)
MCH: 34.3 pg — ABNORMAL HIGH (ref 26.0–34.0)
MCHC: 35.3 g/dL (ref 30.0–36.0)
MCV: 97.2 fL (ref 80.0–100.0)
Monocytes Absolute: 0.6 10*3/uL (ref 0.1–1.0)
Monocytes Relative: 8 %
Neutro Abs: 3.2 10*3/uL (ref 1.7–7.7)
Neutrophils Relative %: 47 %
Platelet Count: 304 10*3/uL (ref 150–400)
RBC: 3.18 MIL/uL — ABNORMAL LOW (ref 3.87–5.11)
RDW: 14 % (ref 11.5–15.5)
WBC Count: 6.9 10*3/uL (ref 4.0–10.5)
nRBC: 0 % (ref 0.0–0.2)

## 2023-05-06 LAB — PHOSPHORUS: Phosphorus: 5 mg/dL — ABNORMAL HIGH (ref 2.5–4.6)

## 2023-05-06 LAB — SAMPLE TO BLOOD BANK

## 2023-05-06 LAB — CMP (CANCER CENTER ONLY)
ALT: 70 U/L — ABNORMAL HIGH (ref 0–44)
AST: 55 U/L — ABNORMAL HIGH (ref 15–41)
Albumin: 3.8 g/dL (ref 3.5–5.0)
Alkaline Phosphatase: 77 U/L (ref 38–126)
Anion gap: 8 (ref 5–15)
BUN: 17 mg/dL (ref 8–23)
CO2: 26 mmol/L (ref 22–32)
Calcium: 9.3 mg/dL (ref 8.9–10.3)
Chloride: 89 mmol/L — ABNORMAL LOW (ref 98–111)
Creatinine: 1.86 mg/dL — ABNORMAL HIGH (ref 0.44–1.00)
GFR, Estimated: 28 mL/min — ABNORMAL LOW (ref >60)
Glucose, Bld: 99 mg/dL (ref 70–99)
Potassium: 4.6 mmol/L (ref 3.5–5.1)
Sodium: 123 mmol/L — ABNORMAL LOW (ref 135–145)
Total Bilirubin: 0.3 mg/dL (ref <1.2)
Total Protein: 6.4 g/dL — ABNORMAL LOW (ref 6.5–8.1)

## 2023-05-06 MED ORDER — HEPARIN SOD (PORK) LOCK FLUSH 100 UNIT/ML IV SOLN
500.0000 [IU] | Freq: Once | INTRAVENOUS | Status: AC
  Administered 2023-05-06: 500 [IU]

## 2023-05-06 MED ORDER — SODIUM CHLORIDE 0.9% FLUSH
10.0000 mL | Freq: Once | INTRAVENOUS | Status: AC
  Administered 2023-05-06: 10 mL

## 2023-05-06 MED ORDER — SODIUM CHLORIDE 1 G PO TABS: 1.0000 g | ORAL_TABLET | Freq: Two times a day (BID) | ORAL | 0 refills | Status: DC

## 2023-05-06 MED ORDER — HEPARIN SOD (PORK) LOCK FLUSH 100 UNIT/ML IV SOLN
500.0000 [IU] | Freq: Once | INTRAVENOUS | Status: DC
Start: 2023-05-06 — End: 2023-05-06

## 2023-05-06 MED ORDER — SODIUM CHLORIDE 0.9 % IV SOLN
INTRAVENOUS | Status: AC
Start: 2023-05-06 — End: 2023-05-06

## 2023-05-07 ENCOUNTER — Telehealth: Payer: Self-pay | Admitting: Internal Medicine

## 2023-05-07 ENCOUNTER — Ambulatory Visit
Admission: RE | Admit: 2023-05-07 | Discharge: 2023-05-07 | Disposition: A | Discharge status: Home / Self Care | Payer: Medicare Other | Source: Ambulatory Visit | Attending: Obstetrics and Gynecology | Admitting: Obstetrics and Gynecology

## 2023-05-07 DIAGNOSIS — N6332 Unspecified lump in axillary tail of the left breast: Secondary | ICD-10-CM

## 2023-05-15 ENCOUNTER — Inpatient Hospital Stay: Payer: Medicare Other

## 2023-05-15 ENCOUNTER — Inpatient Hospital Stay (HOSPITAL_BASED_OUTPATIENT_CLINIC_OR_DEPARTMENT_OTHER): Payer: Medicare Other | Admitting: Internal Medicine

## 2023-05-15 VITALS — BP 114/66 | HR 81 | Temp 98.3°F | Resp 16 | Ht 68.0 in | Wt 170.7 lb

## 2023-05-15 DIAGNOSIS — C7800 Secondary malignant neoplasm of unspecified lung: Secondary | ICD-10-CM

## 2023-05-15 DIAGNOSIS — C7910 Secondary malignant neoplasm of unspecified urinary organs: Secondary | ICD-10-CM

## 2023-05-15 DIAGNOSIS — Z905 Acquired absence of kidney: Secondary | ICD-10-CM | POA: Diagnosis not present

## 2023-05-15 DIAGNOSIS — R9230 Dense breasts, unspecified: Secondary | ICD-10-CM | POA: Diagnosis not present

## 2023-05-15 DIAGNOSIS — M549 Dorsalgia, unspecified: Secondary | ICD-10-CM | POA: Diagnosis not present

## 2023-05-15 DIAGNOSIS — G8929 Other chronic pain: Secondary | ICD-10-CM | POA: Diagnosis not present

## 2023-05-15 DIAGNOSIS — Z95828 Presence of other vascular implants and grafts: Secondary | ICD-10-CM

## 2023-05-15 DIAGNOSIS — D649 Anemia, unspecified: Secondary | ICD-10-CM | POA: Diagnosis not present

## 2023-05-15 DIAGNOSIS — T451X5A Adverse effect of antineoplastic and immunosuppressive drugs, initial encounter: Secondary | ICD-10-CM | POA: Diagnosis not present

## 2023-05-15 DIAGNOSIS — Z7902 Long term (current) use of antithrombotics/antiplatelets: Secondary | ICD-10-CM | POA: Diagnosis not present

## 2023-05-15 DIAGNOSIS — I700 Atherosclerosis of aorta: Secondary | ICD-10-CM | POA: Diagnosis not present

## 2023-05-15 DIAGNOSIS — Z885 Allergy status to narcotic agent status: Secondary | ICD-10-CM | POA: Diagnosis not present

## 2023-05-15 DIAGNOSIS — C679 Malignant neoplasm of bladder, unspecified: Secondary | ICD-10-CM | POA: Diagnosis not present

## 2023-05-15 DIAGNOSIS — M47816 Spondylosis without myelopathy or radiculopathy, lumbar region: Secondary | ICD-10-CM | POA: Diagnosis not present

## 2023-05-15 DIAGNOSIS — G620 Drug-induced polyneuropathy: Secondary | ICD-10-CM | POA: Diagnosis not present

## 2023-05-15 DIAGNOSIS — Z79899 Other long term (current) drug therapy: Secondary | ICD-10-CM | POA: Diagnosis not present

## 2023-05-15 DIAGNOSIS — G4700 Insomnia, unspecified: Secondary | ICD-10-CM | POA: Diagnosis not present

## 2023-05-15 DIAGNOSIS — R5383 Other fatigue: Secondary | ICD-10-CM | POA: Diagnosis not present

## 2023-05-15 DIAGNOSIS — I252 Old myocardial infarction: Secondary | ICD-10-CM | POA: Diagnosis not present

## 2023-05-15 DIAGNOSIS — Z881 Allergy status to other antibiotic agents status: Secondary | ICD-10-CM | POA: Diagnosis not present

## 2023-05-15 DIAGNOSIS — Z803 Family history of malignant neoplasm of breast: Secondary | ICD-10-CM | POA: Diagnosis not present

## 2023-05-15 DIAGNOSIS — L989 Disorder of the skin and subcutaneous tissue, unspecified: Secondary | ICD-10-CM | POA: Diagnosis not present

## 2023-05-15 LAB — CBC WITH DIFFERENTIAL (CANCER CENTER ONLY)
Abs Immature Granulocytes: 0.03 10*3/uL (ref 0.00–0.07)
Basophils Absolute: 0 10*3/uL (ref 0.0–0.1)
Basophils Relative: 1 %
Eosinophils Absolute: 0.2 10*3/uL (ref 0.0–0.5)
Eosinophils Relative: 3 %
HCT: 28.2 % — ABNORMAL LOW (ref 36.0–46.0)
Hemoglobin: 10 g/dL — ABNORMAL LOW (ref 12.0–15.0)
Immature Granulocytes: 1 %
Lymphocytes Relative: 32 %
Lymphs Abs: 2.1 10*3/uL (ref 0.7–4.0)
MCH: 35.3 pg — ABNORMAL HIGH (ref 26.0–34.0)
MCHC: 35.5 g/dL (ref 30.0–36.0)
MCV: 99.6 fL (ref 80.0–100.0)
Monocytes Absolute: 0.6 10*3/uL (ref 0.1–1.0)
Monocytes Relative: 9 %
Neutro Abs: 3.7 10*3/uL (ref 1.7–7.7)
Neutrophils Relative %: 54 %
Platelet Count: 240 10*3/uL (ref 150–400)
RBC: 2.83 MIL/uL — ABNORMAL LOW (ref 3.87–5.11)
RDW: 14.1 % (ref 11.5–15.5)
WBC Count: 6.6 10*3/uL (ref 4.0–10.5)
nRBC: 0 % (ref 0.0–0.2)

## 2023-05-15 LAB — CMP (CANCER CENTER ONLY)
ALT: 55 U/L — ABNORMAL HIGH (ref 0–44)
AST: 28 U/L (ref 15–41)
Albumin: 3.8 g/dL (ref 3.5–5.0)
Alkaline Phosphatase: 75 U/L (ref 38–126)
Anion gap: 6 (ref 5–15)
BUN: 16 mg/dL (ref 8–23)
CO2: 28 mmol/L (ref 22–32)
Calcium: 9.4 mg/dL (ref 8.9–10.3)
Chloride: 98 mmol/L (ref 98–111)
Creatinine: 1.63 mg/dL — ABNORMAL HIGH (ref 0.44–1.00)
GFR, Estimated: 33 mL/min — ABNORMAL LOW (ref >60)
Glucose, Bld: 101 mg/dL — ABNORMAL HIGH (ref 70–99)
Potassium: 4.2 mmol/L (ref 3.5–5.1)
Sodium: 132 mmol/L — ABNORMAL LOW (ref 135–145)
Total Bilirubin: 0.4 mg/dL (ref <1.2)
Total Protein: 6.4 g/dL — ABNORMAL LOW (ref 6.5–8.1)

## 2023-05-15 LAB — PHOSPHORUS: Phosphorus: 4.7 mg/dL — ABNORMAL HIGH (ref 2.5–4.6)

## 2023-05-15 MED ORDER — HEPARIN SOD (PORK) LOCK FLUSH 100 UNIT/ML IV SOLN
500.0000 [IU] | Freq: Once | INTRAVENOUS | Status: AC
  Administered 2023-05-15: 500 [IU]

## 2023-05-15 MED ORDER — SODIUM CHLORIDE 0.9% FLUSH
10.0000 mL | Freq: Once | INTRAVENOUS | Status: AC
  Administered 2023-05-15: 10 mL

## 2023-05-15 NOTE — Progress Notes (Signed)
[facility] [phone]   [phone]) [phone]  OFFICE PROGRESS NOTE  [name], MD [address] [place] [address]  DIAGNOSIS: Metastatic urothelial carcinoma that was initially diagnosed as stage II (T2, N0, M0) in September 2022 status post right nephroureterectomy as well as intravesical treatment and resection of superficial tumor in the bladder several times and March 2023 as well as July 2023.  The patient was found to have enlarging and new pulmonary nodules consistent with metastatic disease in November 2023.    PRIOR THERAPY: 1) Right nephroureterectomy as well as intravesical treatment and resection of superficial tumor in the bladder several times and March 2023 as well as July 2023.  2) Palliative systemic chemotherapy with carboplatin for AUC of 5 on day 1 and gemcitabine 1000 mg/M2 on days 1 and 8 every 3 weeks. First cycle June 21, 2022. Status post 6 cycles. Her dose of carboplatin was reduced to AUC of 4 and gemcitabine to 800 mg/m2 starting from cycle #5 due to cytopenias. 3) Maintenance immunotherapy with Avelumab 800 mg IV every 2 weeks. First dose on 11/15/22. Status post 6 cycle of treatment.   CURRENT THERAPY: Erdafitinib (balversa) 8 mg p.o daily. First dose 02/22/23.  Status post 3 months of treatment.  Lab and follow-up visit with Dr. Arbutus Ped in 4 weeks  INTERVAL HISTORY: Morgan Torres 72 y.o. female returns to the clinic today for follow-up visit accompanied by her husband.Discussed the use of AI scribe software for clinical note transcription with the patient, who gave verbal consent to proceed.  History of Present Illness   The patient, a 72 year old with a history of metastatic urothelial carcinoma, presents with ongoing issues of appetite loss, taste and smell alterations, and fatigue. She expresses a desire to eat but struggles due to the lack of taste and smell. Despite using recommended mouthwash and salt water, she  has not noticed any improvement. The patient denies any further weight loss since the last visit but reports a feeling of weight loss based on the fit of her clothes.  The patient also reports new onset of numbness in her hands, described as a strange texture on her tongue when eating meats, and severe neck pain. These symptoms started after the initiation of Erdafitinib treatment. Despite these side effects, the patient is reluctant to reduce the dosage due to the positive response to the treatment.  The patient also reports a recent course of steroids, which seemed to improve her condition temporarily. Despite the physical discomfort, the patient remains active to the best of her ability and is determined to continue fighting her illness.       MEDICAL HISTORY: Past Medical History:  Diagnosis Date   Anxiety    Arthritis    hip, lumbar spine    Asthma    seasonal allergies    Bronchitis    Hx: of   Chronic kidney disease    COPD (chronic obstructive pulmonary disease) (HCC)    Coronary artery disease    Depression    Fibromyalgia    Hypertension    Lumbar herniated disc    Myocardial infarction (HCC) 06/25/2012   followed by Dr. Rennis Golden, treated medically, no stents   Peripheral arterial disease (HCC)    of right foot   Pneumonia    Hx: of yrs ago   Sciatica    Sleep apnea    Small bowel obstruction (HCC)    several yrs ago  ALLERGIES:  is allergic to diclofenac, codeine, hydrocodone, and ciprofloxacin.  MEDICATIONS:  Current Outpatient Medications  Medication Sig Dispense Refill   acetaminophen (TYLENOL) 500 MG tablet Take 1 tablet (500 mg total) by mouth every 6 (six) hours as needed for mild pain. 30 tablet 0   albuterol (PROVENTIL HFA;VENTOLIN HFA) 108 (90 Base) MCG/ACT inhaler Inhale 2 puffs into the lungs every 6 (six) hours as needed for wheezing or shortness of breath. 1 Inhaler 0   amLODipine (NORVASC) 5 MG tablet Take 1 tablet (5 mg total) by mouth daily.  <PLEASE MAKE APPOINTMENT FOR REFILLS> 15 tablet 0   aspirin EC 81 MG tablet Take 81 mg by mouth daily. Swallow whole.     atorvastatin (LIPITOR) 20 MG tablet Take 20 mg by mouth. Monday wed and Friday in am     calcium acetate (PHOSLO) 667 MG tablet Take 1 tablet (667 mg total) by mouth 3 (three) times daily with meals. 15 tablet 0   cilostazol (PLETAL) 100 MG tablet Take 100 mg by mouth 2 (two) times daily.     EPINEPHrine (EPIPEN 2-PAK) 0.3 mg/0.3 mL DEVI Inject 0.3 mLs (0.3 mg total) into the muscle once as needed (for severe allergic reaction). CAll 911 immediately if you have to use this medicine 1 Device 1   erdafitinib (BALVERSA) 4 MG tablet Take 2 tablets (8 mg total) by mouth daily. 60 tablet 3   HYDROcodone-acetaminophen (NORCO/VICODIN) 5-325 MG tablet Take 1 tablet by mouth 3 (three) times daily as needed.     levothyroxine (SYNTHROID) 50 MCG tablet TAKE 1 TABLET BY MOUTH DAILY BEFORE BREAKFAST 90 tablet 1   levothyroxine (SYNTHROID) 88 MCG tablet TAKE 1 TABLET BY MOUTH DAILY BEFORE BREAKFAST. 90 tablet 2   lidocaine-prilocaine (EMLA) cream Apply to the Port-A-Cath site 30-60-minute before treatment 30 g 2   losartan (COZAAR) 100 MG tablet Take 100 mg by mouth daily.     losartan-hydrochlorothiazide (HYZAAR) 100-12.5 MG tablet Take 1 tablet by mouth daily.     methylPREDNISolone (MEDROL DOSEPAK) 4 MG TBPK tablet Use as instructed 21 tablet 0   metoprolol tartrate (LOPRESSOR) 25 MG tablet TAKE 1 TABLET BY MOUTH TWICE A DAY 60 tablet 1   nicotine (NICODERM CQ - DOSED IN MG/24 HOURS) 21 mg/24hr patch Place 21 mg onto the skin daily.     nicotine polacrilex (NICORETTE) 4 MG gum Take 4 mg by mouth as needed for smoking cessation.     nitroGLYCERIN (NITROSTAT) 0.4 MG SL tablet Place 1 tablet (0.4 mg total) under the tongue every 5 (five) minutes x 3 doses as needed for chest pain. 25 tablet 2   oxybutynin (DITROPAN) 5 MG tablet Take 1 tablet (5 mg total) by mouth every 8 (eight) hours as  needed for bladder spasms. 30 tablet 1   polyethylene glycol (MIRALAX / GLYCOLAX) 17 g packet Take 17 g by mouth as needed.     prochlorperazine (COMPAZINE) 10 MG tablet Take 1 tablet (10 mg total) by mouth every 6 (six) hours as needed for nausea or vomiting. 30 tablet 0   sodium chloride 1 g tablet Take 1 tablet (1 g total) by mouth 2 (two) times daily with a meal. 20 tablet 0   temazepam (RESTORIL) 15 MG capsule Take 1 capsule (15 mg total) by mouth at bedtime as needed for sleep. 30 capsule 0   traMADol (ULTRAM) 50 MG tablet Take by mouth every 6 (six) hours as needed. Not taking     No current  facility-administered medications for this visit.    SURGICAL HISTORY:  Past Surgical History:  Procedure Laterality Date   ABDOMINAL SURGERY     resection of small intestine   ANTERIOR CRUCIATE LIGAMENT REPAIR Bilateral    BACK SURGERY     fusion of lumbar   CARDIAC CATHETERIZATION  06/25/2012   COLON SURGERY     resection for bowel - for obstruction  6 to 7 yrs ago per pt on 01-05-2022   COLONOSCOPY     Hx; of   DILATION AND CURETTAGE OF UTERUS     EYE SURGERY Bilateral    cataracts removed   HERNIA REPAIR  02/23/1969   inguinal hernia    IR IMAGING GUIDED PORT INSERTION  06/07/2022   LEFT HEART CATHETERIZATION WITH CORONARY ANGIOGRAM N/A 03/30/2013   Procedure: LEFT HEART CATHETERIZATION WITH CORONARY ANGIOGRAM;  Surgeon: Marykay Lex, MD;  Location: [facility] CATH LAB;  Service: Cardiovascular;  Laterality: N/A;   left thumb surgery     yrs ago   ROBOT ASSITED LAPAROSCOPIC NEPHROURETERECTOMY Left 03/09/2021   Procedure: XI ROBOT ASSITED LAPAROSCOPIC NEPHROURETERECTOMY/ CYSTOSCOPY WITH LEFT URETEROSCOPY WITH TRANSURETHRAL RESECTION OF URETERAL ORIFICE;  Surgeon: Rene Paci, MD;  Location: [facility] ORS;  Service: Urology;  Laterality: Left;   SALIVARY GLAND SURGERY Left    approached from inside mouth & side of neck 1970-1980   TONSILLECTOMY     as child   TOTAL HIP  ARTHROPLASTY Left 05/28/2014   Procedure: LEFT TOTAL HIP ARTHROPLASTY;  Surgeon: Thera Flake., MD;  Location: [facility] OR;  Service: Orthopedics;  Laterality: Left;   TRANSURETHRAL RESECTION OF BLADDER TUMOR N/A 08/23/2021   Procedure: TRANSURETHRAL RESECTION OF BLADDER TUMOR (TURBT) WITH CYSTOSCOPY/ POSTOPERATIVE INSTILLATION OF GEMCITABINE/retrograde pyelogram and stent placement (right);  Surgeon: Rene Paci, MD;  Location: [facility];  Service: Urology;  Laterality: N/A;   TRANSURETHRAL RESECTION OF BLADDER TUMOR N/A 01/10/2022   Procedure: TRANSURETHRAL RESECTION OF BLADDER TUMOR (TURBT) WITH CYSTOSCOPY / GEMCITABINE INSTILLATION post-operatively;  Surgeon: Rene Paci, MD;  Location: [facility];  Service: Urology;  Laterality: N/A;  ONLY NEEDS 30 MIN    REVIEW OF SYSTEMS:  Constitutional: positive for anorexia, fatigue, and weight loss Eyes: negative Ears, nose, mouth, throat, and face: negative Respiratory: negative Cardiovascular: negative Gastrointestinal: negative Genitourinary:negative Integument/breast: negative Hematologic/lymphatic: negative Musculoskeletal:negative Neurological: negative Behavioral/Psych: negative Endocrine: negative Allergic/Immunologic: negative   PHYSICAL EXAMINATION: General appearance: alert, cooperative, fatigued, and no distress Head: Normocephalic, without obvious abnormality, atraumatic Neck: no adenopathy, no JVD, supple, symmetrical, trachea midline, and thyroid not enlarged, symmetric, no tenderness/mass/nodules Lymph nodes: Cervical, supraclavicular, and axillary nodes normal. Resp: clear to auscultation bilaterally Back: symmetric, no curvature. ROM normal. No CVA tenderness. Cardio: regular rate and rhythm, S1, S2 normal, no murmur, click, rub or gallop GI: soft, non-tender; bowel sounds normal; no masses,  no organomegaly Extremities: extremities normal, atraumatic, no cyanosis or  edema Neurologic: Alert and oriented X 3, normal strength and tone. Normal symmetric reflexes. Normal coordination and gait  ECOG PERFORMANCE STATUS: 1 - Symptomatic but completely ambulatory  Blood pressure 114/66, pulse 81, temperature 98.3 F (36.8 C), temperature source Temporal, resp. rate 16, height 5\' 8"  (1.727 m), weight 170 lb 11.2 oz (77.4 kg), SpO2 97%.  LABORATORY DATA: Lab Results  Component Value Date   WBC 6.9 05/06/2023   HGB 10.9 (L) 05/06/2023   HCT 30.9 (L) 05/06/2023   MCV 97.2 05/06/2023   PLT 304 05/06/2023  Chemistry      Component Value Date/Time   NA 123 (L) 05/06/2023 1505   K 4.6 05/06/2023 1505   CL 89 (L) 05/06/2023 1505   CO2 26 05/06/2023 1505   BUN 17 05/06/2023 1505   CREATININE 1.86 (H) 05/06/2023 1505      Component Value Date/Time   CALCIUM 9.3 05/06/2023 1505   ALKPHOS 77 05/06/2023 1505   AST 55 (H) 05/06/2023 1505   ALT 70 (H) 05/06/2023 1505   BILITOT 0.3 05/06/2023 1505       RADIOGRAPHIC STUDIES: MM 3D DIAGNOSTIC MAMMOGRAM BILATERAL BREAST  Result Date: 05/07/2023 CLINICAL DATA:  72 year old female presenting for annual exam and evaluation of a left axillary lump which is increasing in size. Patient has a strong family history of breast cancer in her mother, sister and daughter. EXAM: DIGITAL DIAGNOSTIC BILATERAL MAMMOGRAM WITH TOMOSYNTHESIS AND CAD; [place] AXILLARY LEFT TECHNIQUE: Bilateral digital diagnostic mammography and breast tomosynthesis was performed. The images were evaluated with computer-aided detection. ; Targeted ultrasound examination of the left axilla was performed. COMPARISON:  Previous exam(s). ACR Breast Density Category b: There are scattered areas of fibroglandular density. FINDINGS: Mammogram: Right breast: No suspicious mass, distortion, or microcalcifications are identified to suggest presence of malignancy. Left breast: A skin BB marks the palpable site of concern reported by the patient in the left axilla.  A spot tangential view of this area was performed in addition to standard views. At the palpable site of concern there is a possible small superficial mass measuring 0.6 cm. On physical exam at the site of concern reported by the patient in the medial left axilla I feel a small discrete superficial mass with associated slight skin discoloration. Ultrasound: Targeted ultrasound is performed at the site of concern in the left axilla demonstrating an irregular hypoechoic mass with possible cystic component that is within the skin measuring 0.5 x 0.4 x 0.3 cm. No internal vascularity. IMPRESSION: 1. At the palpable site of concern in the left axilla there is an indeterminate mass measuring 0.5 cm that is within the skin. 2. No mammographic evidence of malignancy in the bilateral breasts. RECOMMENDATION: 1. Dermatology consultation for biopsy of the left axillary skin lesion. 2. Screening mammogram in one year.(Code:SM-B-01Y). Consider contrast enhanced screening mammography when patient is due for mammogram next year given her strong family history of breast cancer. I have discussed the findings and recommendations with the patient. If applicable, a reminder letter will be sent to the patient regarding the next appointment. BI-RADS CATEGORY  2: Benign. Electronically Signed   By: Emmaline Kluver M.D.   On: 05/07/2023 09:53   [place] AXILLA LEFT  Result Date: 05/07/2023 CLINICAL DATA:  72 year old female presenting for annual exam and evaluation of a left axillary lump which is increasing in size. Patient has a strong family history of breast cancer in her mother, sister and daughter. EXAM: DIGITAL DIAGNOSTIC BILATERAL MAMMOGRAM WITH TOMOSYNTHESIS AND CAD; [place] AXILLARY LEFT TECHNIQUE: Bilateral digital diagnostic mammography and breast tomosynthesis was performed. The images were evaluated with computer-aided detection. ; Targeted ultrasound examination of the left axilla was performed. COMPARISON:  Previous exam(s).  ACR Breast Density Category b: There are scattered areas of fibroglandular density. FINDINGS: Mammogram: Right breast: No suspicious mass, distortion, or microcalcifications are identified to suggest presence of malignancy. Left breast: A skin BB marks the palpable site of concern reported by the patient in the left axilla. A spot tangential view of this area was performed in addition to standard views.  At the palpable site of concern there is a possible small superficial mass measuring 0.6 cm. On physical exam at the site of concern reported by the patient in the medial left axilla I feel a small discrete superficial mass with associated slight skin discoloration. Ultrasound: Targeted ultrasound is performed at the site of concern in the left axilla demonstrating an irregular hypoechoic mass with possible cystic component that is within the skin measuring 0.5 x 0.4 x 0.3 cm. No internal vascularity. IMPRESSION: 1. At the palpable site of concern in the left axilla there is an indeterminate mass measuring 0.5 cm that is within the skin. 2. No mammographic evidence of malignancy in the bilateral breasts. RECOMMENDATION: 1. Dermatology consultation for biopsy of the left axillary skin lesion. 2. Screening mammogram in one year.(Code:[id number]-B-01Y). Consider contrast enhanced screening mammography when patient is due for mammogram next year given her strong family history of breast cancer. I have discussed the findings and recommendations with the patient. If applicable, a reminder letter will be sent to the patient regarding the next appointment. BI-RADS CATEGORY  2: Benign. Electronically Signed   By: Emmaline Kluver M.D.   On: 05/07/2023 09:53   CT ABDOMEN PELVIS WO CONTRAST  Result Date: 04/30/2023 CLINICAL DATA:  Metastatic urothelial carcinoma diagnosed 2022. Status post RIGHT nephro ureterectomy. Additional treatment history intra fascicular sextant superficial tumor several times the bladder. LEFT hip pain. *  Tracking Code: BO * EXAM: CT CHEST, ABDOMEN, AND PELVIS without CONTRAST TECHNIQUE: Multidetector CT imaging of the chest, abdomen and pelvis was performed following the standard protocol RADIATION DOSE REDUCTION: This exam was performed according to the departmental dose-optimization program which includes automated exposure control, adjustment of the mA and/or kV according to patient size and/or use of iterative reconstruction technique. COMPARISON:  CT 01/31/2023 FINDINGS: CT CHEST FINDINGS Cardiovascular: Coronary artery calcification and aortic atherosclerotic calcification. Mediastinum/Nodes: No axillary or supraclavicular adenopathy. No mediastinal or hilar adenopathy. No pericardial fluid. Esophagus normal. Port in the anterior chest wall with tip in distal SVC. Lungs/Pleura: Reduction size pulmonary nodules. For example RIGHT upper lobe nodule not sub solid measuring 5 mm on image 41/5 decreased from 10 mm. LEFT upper lobe nodule on image 54 is subsolid measuring 4 mm decreased from 7 mm. Nodule in the superior segment of the LEFT lower lobe measures 13 mm (image 60) compared to 19 mm. No new pulmonary nodules. Musculoskeletal: No aggressive osseous lesion. CT ABDOMEN AND PELVIS FINDINGS Hepatobiliary: No focal hepatic lesion noncontrast exam. No biliary ductal dilatation. Gallbladder is normal. Common bile duct is normal. Pancreas: Hypodensity in the head of the pancreas measuring 12 mm (image 70) is unchanged. No ductal dilatation. No pancreatic inflammation. Spleen: Normal spleen Adrenals/urinary tract: Adrenal glands normal. Post LEFT nephrectomy. No nodularity nephrectomy bed. No RIGHT nephrolithiasis or ureterolithiasis. Bladder normal noncontrast exam. Stomach/Bowel: Stomach, small bowel, appendix, and cecum are normal. The colon and rectosigmoid colon are normal. Vascular/Lymphatic: Abdominal aorta is normal caliber with atherosclerotic calcification. There is no retroperitoneal or periportal  lymphadenopathy. No pelvic lymphadenopathy. Reproductive: Post hysterectomy.  Adnexa unremarkable Other: No free fluid. Musculoskeletal: Severe sclerotic degenerative change in the lumbar spine. Posterior lumbosacral fusion. No aggressive osseous lesion or acute findings. IMPRESSION: CHEST: 1. Reduction size of pulmonary nodules. 2. No new pulmonary nodules. 3. No thoracic adenopathy. PELVIS: 1. No evidence of metastatic urothelial carcinoma in the abdomen pelvis. 2. Post LEFT nephrectomy. No evidence of local recurrence. 3. Normal RIGHT kidney on noncontrast exam. 4. Stable hypodensity in the head  of the pancreas. Recommend attention on routine oncology surveillance. 5.  Aortic Atherosclerosis (ICD10-I70.0). Electronically Signed   By: Genevive Bi M.D.   On: 04/30/2023 09:15   CT Chest Wo Contrast  Result Date: 04/30/2023 CLINICAL DATA:  Metastatic urothelial carcinoma diagnosed 2022. Status post RIGHT nephro ureterectomy. Additional treatment history intra fascicular sextant superficial tumor several times the bladder. LEFT hip pain. * Tracking Code: BO * EXAM: CT CHEST, ABDOMEN, AND PELVIS without CONTRAST TECHNIQUE: Multidetector CT imaging of the chest, abdomen and pelvis was performed following the standard protocol RADIATION DOSE REDUCTION: This exam was performed according to the departmental dose-optimization program which includes automated exposure control, adjustment of the mA and/or kV according to patient size and/or use of iterative reconstruction technique. COMPARISON:  CT 01/31/2023 FINDINGS: CT CHEST FINDINGS Cardiovascular: Coronary artery calcification and aortic atherosclerotic calcification. Mediastinum/Nodes: No axillary or supraclavicular adenopathy. No mediastinal or hilar adenopathy. No pericardial fluid. Esophagus normal. Port in the anterior chest wall with tip in distal SVC. Lungs/Pleura: Reduction size pulmonary nodules. For example RIGHT upper lobe nodule not sub solid  measuring 5 mm on image 41/5 decreased from 10 mm. LEFT upper lobe nodule on image 54 is subsolid measuring 4 mm decreased from 7 mm. Nodule in the superior segment of the LEFT lower lobe measures 13 mm (image 60) compared to 19 mm. No new pulmonary nodules. Musculoskeletal: No aggressive osseous lesion. CT ABDOMEN AND PELVIS FINDINGS Hepatobiliary: No focal hepatic lesion noncontrast exam. No biliary ductal dilatation. Gallbladder is normal. Common bile duct is normal. Pancreas: Hypodensity in the head of the pancreas measuring 12 mm (image 70) is unchanged. No ductal dilatation. No pancreatic inflammation. Spleen: Normal spleen Adrenals/urinary tract: Adrenal glands normal. Post LEFT nephrectomy. No nodularity nephrectomy bed. No RIGHT nephrolithiasis or ureterolithiasis. Bladder normal noncontrast exam. Stomach/Bowel: Stomach, small bowel, appendix, and cecum are normal. The colon and rectosigmoid colon are normal. Vascular/Lymphatic: Abdominal aorta is normal caliber with atherosclerotic calcification. There is no retroperitoneal or periportal lymphadenopathy. No pelvic lymphadenopathy. Reproductive: Post hysterectomy.  Adnexa unremarkable Other: No free fluid. Musculoskeletal: Severe sclerotic degenerative change in the lumbar spine. Posterior lumbosacral fusion. No aggressive osseous lesion or acute findings. IMPRESSION: CHEST: 1. Reduction size of pulmonary nodules. 2. No new pulmonary nodules. 3. No thoracic adenopathy. PELVIS: 1. No evidence of metastatic urothelial carcinoma in the abdomen pelvis. 2. Post LEFT nephrectomy. No evidence of local recurrence. 3. Normal RIGHT kidney on noncontrast exam. 4. Stable hypodensity in the head of the pancreas. Recommend attention on routine oncology surveillance. 5.  Aortic Atherosclerosis (ICD10-I70.0). Electronically Signed   By: Genevive Bi M.D.   On: 04/30/2023 09:15    ASSESSMENT AND PLAN: This is a very pleasant 72 years old African-American female with  metastatic urothelial carcinoma initially diagnosed in September 2022 as a stage II status post right nephro ureterectomy as well as intravesical treatment and resection of superficial tumor in the bladder for several times.  The patient was found to have evidence of metastatic disease in the lung in November 2023.  She underwent palliative systemic chemotherapy with carboplatin and gemcitabine status post 6 cycles with stable disease followed by maintenance treatment with immunotherapy with Avelumab 800 Mg IV every 2 weeks status post 6 cycles.  This treatment was discontinued secondary to disease progression. The patient had repeat CT scan of the chest, abdomen and pelvis performed recently.  I personally and independently reviewed the scan and discussed the result with the patient today.  Unfortunately her scan showed  evidence for disease progression with enlargement of bilateral pulmonary nodules. Her molecular studies showed positive FGFR mutation and the patient will be a good candidate for treatment with erdafitinib starting at a dose of 8 mg p.o. daily.  She has been tolerating this treatment fairly well except for the fatigue, lack of appetite and weight loss.    Metastatic Urothelial Carcinoma Metastatic urothelial carcinoma diagnosed in November 2023. Initially treated with carboplatin and gemcitabine, followed by maintenance avelumab. Switched to erdafitinib 8 mg daily since late August 2024 due to disease progression. Experiencing side effects including anorexia, dysgeusia, peripheral neuropathy, and neck pain. Prefers to continue current dosage as recent scans show positive response. Mild anemia noted but hematologic parameters are well-managed. Discussed risks of continued erdafitinib, including neuropathy and appetite loss, versus benefits of tumor response. Alternative treatments discussed but patient prefers current regimen. - Continue erdafitinib 8 mg daily - Encourage snacking between  meals to maintain weight - Consider intermittent use of Medrol Dosepak for symptom relief - Follow-up in one month  Chemotherapy-Induced Peripheral Neuropathy New onset of peripheral neuropathy in hands, likely secondary to erdafitinib. Symptoms were absent prior to medication initiation. Follow-up with neurologist for management. - Continue follow-up with neurologist for neuropathy management  General Health Maintenance Recent mammogram and ultrasound were normal. Dermatology referral for skin lesion biopsy and removal. - Follow-up with dermatologist for biopsy and removal of skin lesion - Encourage maintaining activity levels and snacking between meals  Follow-up - Follow-up appointment in one month.   The patient was advised to call immediately if she has any other concerning symptoms in the interval. The patient voices understanding of current disease status and treatment options and is in agreement with the current care plan.  All questions were answered. The patient knows to call the clinic with any problems, questions or concerns. We can certainly see the patient much sooner if necessary.  The total time spent in the appointment was 30 minutes.  Disclaimer: This note was dictated with voice recognition software. Similar sounding words can inadvertently be transcribed and may not be corrected upon review.

## 2023-05-16 ENCOUNTER — Inpatient Hospital Stay: Payer: Medicare Other | Admitting: Licensed Clinical Social Worker

## 2023-05-16 ENCOUNTER — Telehealth: Payer: Self-pay | Admitting: Pharmacy Technician

## 2023-05-16 DIAGNOSIS — C7800 Secondary malignant neoplasm of unspecified lung: Secondary | ICD-10-CM

## 2023-05-16 NOTE — Progress Notes (Signed)
[facility] CSW Progress Note  Visual merchandiser  received a call from pt inquiring about a program to assist w/ pet expenses.  [facility] has a program to assist w/ pet expenses and contact information for [facility] was provided to pt.  Pt also voiced concerns regarding the pain in her hands.  CSW suggested a palliative care referral to which pt verbalized agreement.  Message sent to medical team and palliative care regarding the above.  CSW to remain available to provide support as appropriate throughout duration of treatment.        Rachel Moulds, LCSW Clinical Social Worker [facility]

## 2023-05-16 NOTE — Telephone Encounter (Signed)
Oral Oncology Patient Advocate Encounter   Received notification that patient is due for re-enrollment for assistance for [facility] through [employer].   Re-enrollment process has been initiated and will be completed automatically after a final benefits investigation by the program.   The estimated date for a determination is early January 2025.  [employer] phone number [phone].   I will continue to follow until final determination.  Jinger Neighbors, CPhT-Adv Oncology Pharmacy Patient Advocate [facility] Direct Number: [phone]  Fax: [phone]

## 2023-05-17 DIAGNOSIS — Z8551 Personal history of malignant neoplasm of bladder: Secondary | ICD-10-CM | POA: Diagnosis not present

## 2023-05-17 DIAGNOSIS — C652 Malignant neoplasm of left renal pelvis: Secondary | ICD-10-CM | POA: Diagnosis not present

## 2023-05-27 ENCOUNTER — Other Ambulatory Visit: Payer: Self-pay | Admitting: Physician Assistant

## 2023-05-27 ENCOUNTER — Telehealth: Payer: Self-pay | Admitting: Medical Oncology

## 2023-05-27 ENCOUNTER — Inpatient Hospital Stay: Payer: Medicare Other | Attending: Internal Medicine | Admitting: Physician Assistant

## 2023-05-27 VITALS — BP 127/64 | HR 80 | Temp 97.6°F | Resp 18 | Ht 68.0 in | Wt 170.3 lb

## 2023-05-27 DIAGNOSIS — Z8249 Family history of ischemic heart disease and other diseases of the circulatory system: Secondary | ICD-10-CM | POA: Insufficient documentation

## 2023-05-27 DIAGNOSIS — Z881 Allergy status to other antibiotic agents status: Secondary | ICD-10-CM | POA: Insufficient documentation

## 2023-05-27 DIAGNOSIS — Z9089 Acquired absence of other organs: Secondary | ICD-10-CM | POA: Insufficient documentation

## 2023-05-27 DIAGNOSIS — C787 Secondary malignant neoplasm of liver and intrahepatic bile duct: Secondary | ICD-10-CM | POA: Diagnosis not present

## 2023-05-27 DIAGNOSIS — Z7902 Long term (current) use of antithrombotics/antiplatelets: Secondary | ICD-10-CM | POA: Diagnosis not present

## 2023-05-27 DIAGNOSIS — R197 Diarrhea, unspecified: Secondary | ICD-10-CM | POA: Diagnosis not present

## 2023-05-27 DIAGNOSIS — B351 Tinea unguium: Secondary | ICD-10-CM | POA: Insufficient documentation

## 2023-05-27 DIAGNOSIS — Z833 Family history of diabetes mellitus: Secondary | ICD-10-CM | POA: Diagnosis not present

## 2023-05-27 DIAGNOSIS — C7910 Secondary malignant neoplasm of unspecified urinary organs: Secondary | ICD-10-CM | POA: Diagnosis not present

## 2023-05-27 DIAGNOSIS — M898X9 Other specified disorders of bone, unspecified site: Secondary | ICD-10-CM | POA: Insufficient documentation

## 2023-05-27 DIAGNOSIS — G629 Polyneuropathy, unspecified: Secondary | ICD-10-CM | POA: Diagnosis not present

## 2023-05-27 DIAGNOSIS — Z803 Family history of malignant neoplasm of breast: Secondary | ICD-10-CM | POA: Insufficient documentation

## 2023-05-27 DIAGNOSIS — Z79899 Other long term (current) drug therapy: Secondary | ICD-10-CM | POA: Insufficient documentation

## 2023-05-27 DIAGNOSIS — L609 Nail disorder, unspecified: Secondary | ICD-10-CM | POA: Insufficient documentation

## 2023-05-27 DIAGNOSIS — F17210 Nicotine dependence, cigarettes, uncomplicated: Secondary | ICD-10-CM | POA: Insufficient documentation

## 2023-05-27 DIAGNOSIS — R5383 Other fatigue: Secondary | ICD-10-CM | POA: Insufficient documentation

## 2023-05-27 DIAGNOSIS — L989 Disorder of the skin and subcutaneous tissue, unspecified: Secondary | ICD-10-CM | POA: Diagnosis not present

## 2023-05-27 DIAGNOSIS — I252 Old myocardial infarction: Secondary | ICD-10-CM | POA: Diagnosis not present

## 2023-05-27 DIAGNOSIS — C7800 Secondary malignant neoplasm of unspecified lung: Secondary | ICD-10-CM | POA: Diagnosis not present

## 2023-05-27 DIAGNOSIS — M79643 Pain in unspecified hand: Secondary | ICD-10-CM | POA: Diagnosis not present

## 2023-05-27 DIAGNOSIS — Z885 Allergy status to narcotic agent status: Secondary | ICD-10-CM | POA: Diagnosis not present

## 2023-05-27 DIAGNOSIS — Z809 Family history of malignant neoplasm, unspecified: Secondary | ICD-10-CM | POA: Insufficient documentation

## 2023-05-27 DIAGNOSIS — C679 Malignant neoplasm of bladder, unspecified: Secondary | ICD-10-CM | POA: Insufficient documentation

## 2023-05-27 DIAGNOSIS — D649 Anemia, unspecified: Secondary | ICD-10-CM | POA: Diagnosis not present

## 2023-05-27 DIAGNOSIS — M549 Dorsalgia, unspecified: Secondary | ICD-10-CM | POA: Insufficient documentation

## 2023-05-27 MED ORDER — CICLOPIROX 8 % EX KIT: PACK | CUTANEOUS | 0 refills | Status: DC

## 2023-05-27 NOTE — Progress Notes (Signed)
Symptom Management Consult Note [facility]    Patient Care Team: [name], MD as PCP - General (Family Medicine) Rennis Golden Lisette Abu, MD as PCP - Cardiology (Cardiology) Si Gaul, MD as Consulting Physician (Oncology)    Name / MRN / DOB: Morgan Torres  [id number]  1950-11-18   Date of visit: 05/27/2023   Chief Complaint/Reason for visit: hand pain   Current Therapy: Erdafitinib     ASSESSMENT & PLAN: Patient is a 72 y.o. female with oncologic history of bladder cancer metastasized to lung followed by Dr. Arbutus Ped.  I have viewed most recent oncology note and lab work.    #Bladder cancer metastasized to lung - Next appointment with oncologist is 06/17/23.   #Onychomycosis of fingernails - AE of Erdafitinib is disorder of nail (62% to 70%) . - Exam today without signs of paronychia or infection. Exam today is showing nail discoloration and onychomycosis. Prescription sent to the pharmacy for ciclopirox. Discussed how to apply. -She has history of neuropathy in her feet and previously took gabapentin. She is not interested in starting Gabapentin at this time to help her her neuropathy she is also experiencing.  -Discussed with patient if symptoms do not improve she needs to RTC.  Strict ED precautions discussed should symptoms worsen.   Heme/Onc History: Oncology History  Metastatic urothelial carcinoma (HCC)  05/30/2022 Initial Diagnosis   Metastatic urothelial carcinoma (HCC)   06/21/2022 - 10/20/2022 Chemotherapy   Patient is on Treatment Plan : BLADDER Carboplatin D1 + Gemcitabine D1,8 q21d     11/14/2022 - 01/24/2023 Chemotherapy   Patient is on Treatment Plan : BLADDER Avelumab q14d     Bladder cancer metastasized to lung (HCC)  05/30/2022 Initial Diagnosis   Bladder cancer metastasized to lung [facility])   05/30/2022 Cancer Staging   Staging form: Lung, AJCC 8th Edition - Clinical: Stage IVA (cT2a, cN0, cM1a) - Signed by Si Gaul,  MD on 05/30/2022       Interval history-: Discussed the use of AI scribe software for clinical note transcription with the patient, who gave verbal consent to proceed.   Morgan Torres is a 72 y.o. female with oncologic history as above presenting [facility] today with chief complaint of hand pain. Patient presents unaccompanied to visit today.  Patient reports pain in the nail beds of their hands, which has been ongoing for approximately two to three weeks. The pain has been progressively worsening, to the point where it is difficult for the patient to perform basic tasks such as wiping themselves after using the restroom. The pain is exacerbated by any pressure applied to the fingers, and even accidental bumps cause significant discomfort. She also noticed her nails are black at the base and yellow at the tips which is new.  In the past, the patient was prescribed gabapentin for neuropathy in their feet, which they took for over a year without noticeable improvement. Currently, they are not on any specific medication for neuropathy. To manage the pain, the patient has been soaking their hands in Epsom salt and hot water, applying a heating blanket, and using a muscle rub. These measures provide some relief, but not significantly.  The patient also reports numbness around the nail beds and at the ends of their fingers. They describe their hands as feeling rough, and their nails have become discolored, thicker, and harder over the past few weeks. The patient denies any history of nail fungus. The patient has previously stopped their  treatment for a few days due to diarrhea but has since restarted the medication. The diarrhea has resolved.  ROS  All other systems are reviewed and are negative for acute change except as noted in the HPI.    Allergies  Allergen Reactions   Diclofenac Anaphylaxis   Codeine Itching   Hydrocodone Itching   Ciprofloxacin Rash     Past Medical History:  Diagnosis  Date   Anxiety    Arthritis    hip, lumbar spine    Asthma    seasonal allergies    Bronchitis    Hx: of   Chronic kidney disease    COPD (chronic obstructive pulmonary disease) (HCC)    Coronary artery disease    Depression    Fibromyalgia    Hypertension    Lumbar herniated disc    Myocardial infarction (HCC) 06/25/2012   followed by Dr. Rennis Golden, treated medically, no stents   Peripheral arterial disease (HCC)    of right foot   Pneumonia    Hx: of yrs ago   Sciatica    Sleep apnea    Small bowel obstruction (HCC)    several yrs ago     Past Surgical History:  Procedure Laterality Date   ABDOMINAL SURGERY     resection of small intestine   ANTERIOR CRUCIATE LIGAMENT REPAIR Bilateral    BACK SURGERY     fusion of lumbar   CARDIAC CATHETERIZATION  06/25/2012   COLON SURGERY     resection for bowel - for obstruction  6 to 7 yrs ago per pt on 01-05-2022   COLONOSCOPY     Hx; of   DILATION AND CURETTAGE OF UTERUS     EYE SURGERY Bilateral    cataracts removed   HERNIA REPAIR  02/23/1969   inguinal hernia    IR IMAGING GUIDED PORT INSERTION  06/07/2022   LEFT HEART CATHETERIZATION WITH CORONARY ANGIOGRAM N/A 03/30/2013   Procedure: LEFT HEART CATHETERIZATION WITH CORONARY ANGIOGRAM;  Surgeon: Marykay Lex, MD;  Location: [facility] CATH LAB;  Service: Cardiovascular;  Laterality: N/A;   left thumb surgery     yrs ago   ROBOT ASSITED LAPAROSCOPIC NEPHROURETERECTOMY Left 03/09/2021   Procedure: XI ROBOT ASSITED LAPAROSCOPIC NEPHROURETERECTOMY/ CYSTOSCOPY WITH LEFT URETEROSCOPY WITH TRANSURETHRAL RESECTION OF URETERAL ORIFICE;  Surgeon: Rene Paci, MD;  Location: [facility] ORS;  Service: Urology;  Laterality: Left;   SALIVARY GLAND SURGERY Left    approached from inside mouth & side of neck 1970-1980   TONSILLECTOMY     as child   TOTAL HIP ARTHROPLASTY Left 05/28/2014   Procedure: LEFT TOTAL HIP ARTHROPLASTY;  Surgeon: Thera Flake., MD;  Location: [facility] OR;   Service: Orthopedics;  Laterality: Left;   TRANSURETHRAL RESECTION OF BLADDER TUMOR N/A 08/23/2021   Procedure: TRANSURETHRAL RESECTION OF BLADDER TUMOR (TURBT) WITH CYSTOSCOPY/ POSTOPERATIVE INSTILLATION OF GEMCITABINE/retrograde pyelogram and stent placement (right);  Surgeon: Rene Paci, MD;  Location: [facility];  Service: Urology;  Laterality: N/A;   TRANSURETHRAL RESECTION OF BLADDER TUMOR N/A 01/10/2022   Procedure: TRANSURETHRAL RESECTION OF BLADDER TUMOR (TURBT) WITH CYSTOSCOPY / GEMCITABINE INSTILLATION post-operatively;  Surgeon: Rene Paci, MD;  Location: [facility];  Service: Urology;  Laterality: N/A;  ONLY NEEDS 30 MIN    Social History   Socioeconomic History   Marital status: Married    Spouse name: Not on file   Number of children: Not on file   Years of education: Not on  file   Highest education level: Not on file  Occupational History   Not on file  Tobacco Use   Smoking status: Some Days    Current packs/day: 0.50    Average packs/day: 0.5 packs/day for 25.0 years (12.5 ttl pk-yrs)    Types: Cigarettes   Smokeless tobacco: Never   Tobacco comments:    Currently on the Nicoderm patch."smokes a few"  Vaping Use   Vaping status: Never Used  Substance and Sexual Activity   Alcohol use: Yes    Comment: occasionally   Drug use: No   Sexual activity: Not Currently  Other Topics Concern   Not on file  Social History Narrative   Not on file   Social Determinants of Health   Financial Resource Strain: Not on file  Food Insecurity: Not on file  Transportation Needs: Not on file  Physical Activity: Not on file  Stress: Not on file  Social Connections: Not on file  Intimate Partner Violence: Not on file    Family History  Problem Relation Age of Onset   Hypertension Mother    Diabetes Mother    Cancer - Other Mother    Cancer Sister    Breast cancer Daughter      Current Outpatient  Medications:    Ciclopirox 8 % KIT, Apply to adjacent skin and affected nails once daily preferably at bedtime or 8 hours before washing.  Remove with alcohol every 7 days., Disp: 34.6 mL, Rfl: 0   acetaminophen (TYLENOL) 500 MG tablet, Take 1 tablet (500 mg total) by mouth every 6 (six) hours as needed for mild pain., Disp: 30 tablet, Rfl: 0   albuterol (PROVENTIL HFA;VENTOLIN HFA) 108 (90 Base) MCG/ACT inhaler, Inhale 2 puffs into the lungs every 6 (six) hours as needed for wheezing or shortness of breath., Disp: 1 Inhaler, Rfl: 0   amLODipine (NORVASC) 5 MG tablet, Take 1 tablet (5 mg total) by mouth daily. <PLEASE MAKE APPOINTMENT FOR REFILLS>, Disp: 15 tablet, Rfl: 0   aspirin EC 81 MG tablet, Take 81 mg by mouth daily. Swallow whole., Disp: , Rfl:    atorvastatin (LIPITOR) 20 MG tablet, Take 20 mg by mouth. Monday wed and Friday in am, Disp: , Rfl:    calcium acetate (PHOSLO) 667 MG tablet, Take 1 tablet (667 mg total) by mouth 3 (three) times daily with meals., Disp: 15 tablet, Rfl: 0   cilostazol (PLETAL) 100 MG tablet, Take 100 mg by mouth 2 (two) times daily., Disp: , Rfl:    EPINEPHrine (EPIPEN 2-PAK) 0.3 mg/0.3 mL DEVI, Inject 0.3 mLs (0.3 mg total) into the muscle once as needed (for severe allergic reaction). CAll 911 immediately if you have to use this medicine, Disp: 1 Device, Rfl: 1   erdafitinib (BALVERSA) 4 MG tablet, Take 2 tablets (8 mg total) by mouth daily., Disp: 60 tablet, Rfl: 3   HYDROcodone-acetaminophen (NORCO/VICODIN) 5-325 MG tablet, Take 1 tablet by mouth 3 (three) times daily as needed., Disp: , Rfl:    levothyroxine (SYNTHROID) 50 MCG tablet, TAKE 1 TABLET BY MOUTH DAILY BEFORE BREAKFAST, Disp: 90 tablet, Rfl: 1   levothyroxine (SYNTHROID) 88 MCG tablet, TAKE 1 TABLET BY MOUTH DAILY BEFORE BREAKFAST., Disp: 90 tablet, Rfl: 2   lidocaine-prilocaine (EMLA) cream, Apply to the Port-A-Cath site 30-60-minute before treatment, Disp: 30 g, Rfl: 2   losartan (COZAAR) 100 MG  tablet, Take 100 mg by mouth daily., Disp: , Rfl:    losartan-hydrochlorothiazide (HYZAAR) 100-12.5 MG tablet, Take 1  tablet by mouth daily., Disp: , Rfl:    methylPREDNISolone (MEDROL DOSEPAK) 4 MG TBPK tablet, Use as instructed, Disp: 21 tablet, Rfl: 0   metoprolol tartrate (LOPRESSOR) 25 MG tablet, TAKE 1 TABLET BY MOUTH TWICE A DAY, Disp: 60 tablet, Rfl: 1   nicotine (NICODERM CQ - DOSED IN MG/24 HOURS) 21 mg/24hr patch, Place 21 mg onto the skin daily., Disp: , Rfl:    nicotine polacrilex (NICORETTE) 4 MG gum, Take 4 mg by mouth as needed for smoking cessation., Disp: , Rfl:    nitroGLYCERIN (NITROSTAT) 0.4 MG SL tablet, Place 1 tablet (0.4 mg total) under the tongue every 5 (five) minutes x 3 doses as needed for chest pain., Disp: 25 tablet, Rfl: 2   oxybutynin (DITROPAN) 5 MG tablet, Take 1 tablet (5 mg total) by mouth every 8 (eight) hours as needed for bladder spasms., Disp: 30 tablet, Rfl: 1   polyethylene glycol (MIRALAX / GLYCOLAX) 17 g packet, Take 17 g by mouth as needed., Disp: , Rfl:    prochlorperazine (COMPAZINE) 10 MG tablet, Take 1 tablet (10 mg total) by mouth every 6 (six) hours as needed for nausea or vomiting., Disp: 30 tablet, Rfl: 0   sodium chloride 1 g tablet, Take 1 tablet (1 g total) by mouth 2 (two) times daily with a meal., Disp: 20 tablet, Rfl: 0   temazepam (RESTORIL) 15 MG capsule, Take 1 capsule (15 mg total) by mouth at bedtime as needed for sleep., Disp: 30 capsule, Rfl: 0   traMADol (ULTRAM) 50 MG tablet, Take by mouth every 6 (six) hours as needed. Not taking, Disp: , Rfl:   PHYSICAL EXAM: ECOG FS:1 - Symptomatic but completely ambulatory    Vitals:   05/27/23 1423 05/27/23 1424  BP:  127/64  Pulse:  80  Resp:  18  Temp:  97.6 F (36.4 C)  TempSrc:  Oral  SpO2:  99%  Weight: 170 lb 4.8 oz (77.2 kg)   Height: 5\' 8"  (1.727 m)    Physical Exam Vitals and nursing note reviewed.  Constitutional:      Appearance: She is not ill-appearing or  toxic-appearing.  HENT:     Head: Normocephalic.  Eyes:     Conjunctiva/sclera: Conjunctivae normal.  Cardiovascular:     Rate and Rhythm: Normal rate.     Pulses: Normal pulses.          Radial pulses are 2+ on the right side and 2+ on the left side.  Pulmonary:     Effort: Pulmonary effort is normal.  Abdominal:     General: There is no distension.  Musculoskeletal:     Cervical back: Normal range of motion.  Skin:    General: Skin is warm and dry.     Comments: Please see media below. No erythema or signs of infections noted to fingers. Nails are thick.  Neurological:     Mental Status: She is alert.     Comments: Sensation intact to bilateral hands. Strong and equal grip strength of BUE.        LABORATORY DATA: I have reviewed the data as listed    Latest Ref Rng & Units 05/15/2023    3:20 PM 05/06/2023    3:05 PM 04/30/2023   10:54 AM  CBC  WBC 4.0 - 10.5 K/uL 6.6  6.9  6.7   Hemoglobin 12.0 - 15.0 g/dL 16.1  09.6  04.5   Hematocrit 36.0 - 46.0 % 28.2  30.9  28.5  Platelets 150 - 400 K/uL 240  304  264         Latest Ref Rng & Units 05/15/2023    3:20 PM 05/06/2023    3:05 PM 04/30/2023   10:54 AM  CMP  Glucose 70 - 99 mg/dL 657  99  82   BUN 8 - 23 mg/dL 16  17  18    Creatinine 0.44 - 1.00 mg/dL 8.46  9.62  9.52   Sodium 135 - 145 mmol/L 132  123  129   Potassium 3.5 - 5.1 mmol/L 4.2  4.6  4.5   Chloride 98 - 111 mmol/L 98  89  98   CO2 22 - 32 mmol/L 28  26  24    Calcium 8.9 - 10.3 mg/dL 9.4  9.3  9.4   Total Protein 6.5 - 8.1 g/dL 6.4  6.4  6.5   Total Bilirubin <1.2 mg/dL 0.4  0.3  0.3   Alkaline Phos 38 - 126 U/L 75  77  74   AST 15 - 41 U/L 28  55  28   ALT 0 - 44 U/L 55  70  21        RADIOGRAPHIC STUDIES (from last 24 hours if applicable) I have personally reviewed the radiological images as listed and agreed with the findings in the report. No results found.      Visit Diagnosis: 1. Metastatic urothelial carcinoma (HCC)   2.  Onychomycosis      No orders of the defined types were placed in this encounter.   All questions were answered. The patient knows to call the clinic with any problems, questions or concerns. No barriers to learning was detected.  A total of more than 30 minutes were spent on this encounter with face-to-face time and non-face-to-face time, including preparing to see the patient, ordering medications, counseling the patient and coordination of care as outlined above.    Thank you for allowing me to participate in the care of this patient.    Shanon Ace, PA-C Department of Hematology/Oncology [facility] Phone: [phone]  [phone]    05/27/2023 4:17 PM

## 2023-05-27 NOTE — Telephone Encounter (Signed)
She is having nail disorders >pain and discoloration x 2 weeks. Her fingernails hurt so bad , "I cannot wipe my butt". She has been on Erdafitinib since aug . Pt confirmed [facility] today at 2 pm..

## 2023-05-27 NOTE — Patient Instructions (Signed)
I am asking the scheduling department to set up an appointment to follow up with Dr. Arbutus Ped in 3 weeks. The scheduler team will call you to let you know when the appointment is.  If your symptoms do not get better then please call to let us know.

## 2023-05-28 ENCOUNTER — Telehealth: Payer: Self-pay | Admitting: Physician Assistant

## 2023-05-28 ENCOUNTER — Encounter: Payer: Self-pay | Admitting: Internal Medicine

## 2023-05-28 ENCOUNTER — Other Ambulatory Visit (HOSPITAL_COMMUNITY): Payer: Self-pay

## 2023-05-28 NOTE — Telephone Encounter (Signed)
Patient seen by me yesterday for nail changes suspected to be onychomycosis  .  Prescription was sent to her pharmacy for Ciclopirox.  I received notification from her pharmacy that this was not covered by her insurance.  I reached out to our pharmacy here at [facility] and the prescription would cost over $300 for the Ciclopirox kit. An alternative is a bottle of Ciclopirox only vs OTC Kerasal. Patient prefers to try [facility] first. She know to RTC if symptoms do not improve. She has no further questions at this time.

## 2023-05-29 ENCOUNTER — Encounter: Payer: Self-pay | Admitting: Internal Medicine

## 2023-05-29 DIAGNOSIS — H04123 Dry eye syndrome of bilateral lacrimal glands: Secondary | ICD-10-CM | POA: Diagnosis not present

## 2023-05-29 DIAGNOSIS — H35713 Central serous chorioretinopathy, bilateral: Secondary | ICD-10-CM | POA: Diagnosis not present

## 2023-05-30 ENCOUNTER — Other Ambulatory Visit: Payer: Medicare Other

## 2023-05-30 ENCOUNTER — Ambulatory Visit: Payer: Medicare Other | Admitting: Internal Medicine

## 2023-05-31 ENCOUNTER — Other Ambulatory Visit: Payer: Medicare Other

## 2023-05-31 ENCOUNTER — Ambulatory Visit: Payer: Medicare Other | Admitting: Physician Assistant

## 2023-06-11 ENCOUNTER — Other Ambulatory Visit: Payer: Self-pay | Admitting: Cardiology

## 2023-06-17 ENCOUNTER — Inpatient Hospital Stay: Payer: Medicare Other

## 2023-06-17 ENCOUNTER — Inpatient Hospital Stay (HOSPITAL_BASED_OUTPATIENT_CLINIC_OR_DEPARTMENT_OTHER): Payer: Medicare Other | Admitting: Internal Medicine

## 2023-06-17 VITALS — BP 137/69 | HR 64 | Temp 97.2°F | Resp 15 | Ht 68.0 in | Wt 169.5 lb

## 2023-06-17 DIAGNOSIS — Z803 Family history of malignant neoplasm of breast: Secondary | ICD-10-CM | POA: Diagnosis not present

## 2023-06-17 DIAGNOSIS — M549 Dorsalgia, unspecified: Secondary | ICD-10-CM | POA: Diagnosis not present

## 2023-06-17 DIAGNOSIS — Z881 Allergy status to other antibiotic agents status: Secondary | ICD-10-CM | POA: Diagnosis not present

## 2023-06-17 DIAGNOSIS — C7910 Secondary malignant neoplasm of unspecified urinary organs: Secondary | ICD-10-CM

## 2023-06-17 DIAGNOSIS — L989 Disorder of the skin and subcutaneous tissue, unspecified: Secondary | ICD-10-CM | POA: Diagnosis not present

## 2023-06-17 DIAGNOSIS — Z885 Allergy status to narcotic agent status: Secondary | ICD-10-CM | POA: Diagnosis not present

## 2023-06-17 DIAGNOSIS — C7800 Secondary malignant neoplasm of unspecified lung: Secondary | ICD-10-CM | POA: Diagnosis not present

## 2023-06-17 DIAGNOSIS — I252 Old myocardial infarction: Secondary | ICD-10-CM | POA: Diagnosis not present

## 2023-06-17 DIAGNOSIS — F17210 Nicotine dependence, cigarettes, uncomplicated: Secondary | ICD-10-CM | POA: Diagnosis not present

## 2023-06-17 DIAGNOSIS — Z833 Family history of diabetes mellitus: Secondary | ICD-10-CM | POA: Diagnosis not present

## 2023-06-17 DIAGNOSIS — D649 Anemia, unspecified: Secondary | ICD-10-CM | POA: Diagnosis not present

## 2023-06-17 DIAGNOSIS — M898X9 Other specified disorders of bone, unspecified site: Secondary | ICD-10-CM | POA: Diagnosis not present

## 2023-06-17 DIAGNOSIS — L720 Epidermal cyst: Secondary | ICD-10-CM | POA: Diagnosis not present

## 2023-06-17 DIAGNOSIS — Z95828 Presence of other vascular implants and grafts: Secondary | ICD-10-CM

## 2023-06-17 DIAGNOSIS — M79643 Pain in unspecified hand: Secondary | ICD-10-CM | POA: Diagnosis not present

## 2023-06-17 DIAGNOSIS — C679 Malignant neoplasm of bladder, unspecified: Secondary | ICD-10-CM | POA: Diagnosis not present

## 2023-06-17 DIAGNOSIS — Z8249 Family history of ischemic heart disease and other diseases of the circulatory system: Secondary | ICD-10-CM | POA: Diagnosis not present

## 2023-06-17 DIAGNOSIS — Z9089 Acquired absence of other organs: Secondary | ICD-10-CM | POA: Diagnosis not present

## 2023-06-17 DIAGNOSIS — Z79899 Other long term (current) drug therapy: Secondary | ICD-10-CM | POA: Diagnosis not present

## 2023-06-17 DIAGNOSIS — R5383 Other fatigue: Secondary | ICD-10-CM

## 2023-06-17 DIAGNOSIS — R197 Diarrhea, unspecified: Secondary | ICD-10-CM | POA: Diagnosis not present

## 2023-06-17 DIAGNOSIS — Z7902 Long term (current) use of antithrombotics/antiplatelets: Secondary | ICD-10-CM | POA: Diagnosis not present

## 2023-06-17 DIAGNOSIS — L609 Nail disorder, unspecified: Secondary | ICD-10-CM | POA: Diagnosis not present

## 2023-06-17 DIAGNOSIS — Z809 Family history of malignant neoplasm, unspecified: Secondary | ICD-10-CM | POA: Diagnosis not present

## 2023-06-17 DIAGNOSIS — B351 Tinea unguium: Secondary | ICD-10-CM | POA: Diagnosis not present

## 2023-06-17 DIAGNOSIS — C787 Secondary malignant neoplasm of liver and intrahepatic bile duct: Secondary | ICD-10-CM | POA: Diagnosis not present

## 2023-06-17 DIAGNOSIS — G629 Polyneuropathy, unspecified: Secondary | ICD-10-CM | POA: Diagnosis not present

## 2023-06-17 LAB — CBC WITH DIFFERENTIAL (CANCER CENTER ONLY)
Abs Immature Granulocytes: 0.02 10*3/uL (ref 0.00–0.07)
Basophils Absolute: 0 10*3/uL (ref 0.0–0.1)
Basophils Relative: 1 %
Eosinophils Absolute: 0.2 10*3/uL (ref 0.0–0.5)
Eosinophils Relative: 4 %
HCT: 32.3 % — ABNORMAL LOW (ref 36.0–46.0)
Hemoglobin: 11.1 g/dL — ABNORMAL LOW (ref 12.0–15.0)
Immature Granulocytes: 0 %
Lymphocytes Relative: 40 %
Lymphs Abs: 2.4 10*3/uL (ref 0.7–4.0)
MCH: 34 pg (ref 26.0–34.0)
MCHC: 34.4 g/dL (ref 30.0–36.0)
MCV: 99.1 fL (ref 80.0–100.0)
Monocytes Absolute: 0.6 10*3/uL (ref 0.1–1.0)
Monocytes Relative: 9 %
Neutro Abs: 2.8 10*3/uL (ref 1.7–7.7)
Neutrophils Relative %: 46 %
Platelet Count: 283 10*3/uL (ref 150–400)
RBC: 3.26 MIL/uL — ABNORMAL LOW (ref 3.87–5.11)
RDW: 13.6 % (ref 11.5–15.5)
WBC Count: 6 10*3/uL (ref 4.0–10.5)
nRBC: 0 % (ref 0.0–0.2)

## 2023-06-17 LAB — PHOSPHORUS: Phosphorus: 4.6 mg/dL (ref 2.5–4.6)

## 2023-06-17 LAB — CMP (CANCER CENTER ONLY)
ALT: 28 U/L (ref 0–44)
AST: 32 U/L (ref 15–41)
Albumin: 4 g/dL (ref 3.5–5.0)
Alkaline Phosphatase: 82 U/L (ref 38–126)
Anion gap: 7 (ref 5–15)
BUN: 24 mg/dL — ABNORMAL HIGH (ref 8–23)
CO2: 26 mmol/L (ref 22–32)
Calcium: 9.3 mg/dL (ref 8.9–10.3)
Chloride: 101 mmol/L (ref 98–111)
Creatinine: 1.56 mg/dL — ABNORMAL HIGH (ref 0.44–1.00)
GFR, Estimated: 35 mL/min — ABNORMAL LOW (ref >60)
Glucose, Bld: 89 mg/dL (ref 70–99)
Potassium: 4.3 mmol/L (ref 3.5–5.1)
Sodium: 134 mmol/L — ABNORMAL LOW (ref 135–145)
Total Bilirubin: 0.4 mg/dL (ref <1.2)
Total Protein: 6.4 g/dL — ABNORMAL LOW (ref 6.5–8.1)

## 2023-06-17 LAB — LACTATE DEHYDROGENASE: LDH: 144 U/L (ref 98–192)

## 2023-06-17 MED ORDER — HEPARIN SOD (PORK) LOCK FLUSH 100 UNIT/ML IV SOLN
500.0000 [IU] | Freq: Once | INTRAVENOUS | Status: AC
  Administered 2023-06-17: 500 [IU]

## 2023-06-17 MED ORDER — SODIUM CHLORIDE 0.9% FLUSH
10.0000 mL | Freq: Once | INTRAVENOUS | Status: AC
  Administered 2023-06-17: 10 mL

## 2023-06-17 NOTE — Progress Notes (Signed)
[facility] Telephone:([phone]   [phone]  OFFICE PROGRESS NOTE  Morgan Mires, MD [address] [place] [address]  DIAGNOSIS: Metastatic urothelial carcinoma that was initially diagnosed as stage II (T2, N0, M0) in September 2022 status post right nephroureterectomy as well as intravesical treatment and resection of superficial tumor in the bladder several times and March 2023 as well as July 2023.  The patient was found to have enlarging and new pulmonary nodules consistent with metastatic disease in November 2023.    PRIOR THERAPY: 1) Right nephroureterectomy as well as intravesical treatment and resection of superficial tumor in the bladder several times and March 2023 as well as July 2023.  2) Palliative systemic chemotherapy with carboplatin for AUC of 5 on day 1 and gemcitabine 1000 mg/M2 on days 1 and 8 every 3 weeks. First cycle June 21, 2022. Status post 6 cycles. Her dose of carboplatin was reduced to AUC of 4 and gemcitabine to 800 mg/m2 starting from cycle #5 due to cytopenias. 3) Maintenance immunotherapy with Avelumab 800 mg IV every 2 weeks. First dose on 11/15/22. Status post 6 cycle of treatment.   CURRENT THERAPY: Erdafitinib (balversa) 8 mg p.o daily. First dose 02/22/23.  Status post 4 months of treatment.  INTERVAL HISTORY: Morgan Torres 72 y.o. female returns to the clinic today for follow-up visit accompanied by her husband.Discussed the use of AI scribe software for clinical note transcription with the patient, who gave verbal consent to proceed.  History of Present Illness   Morgan Torres, a 72 year old patient with metastatic urothelial carcinoma, initially diagnosed in September 2022, has been experiencing persistent metastatic disease, particularly in the liver, since November 2023. Initially, the patient was treated with chemotherapy, specifically carboplatin and gemcitabine, followed by immune therapy with avelumab.  However, due to cancer progression, the avelumab was discontinued.  Since February 22, 2023, the patient has been on the oral drug, idrafatinib, at a dose of 8 mg daily. The patient reports that her condition has been improving over the past five months on this treatment. She notes a return of taste and appetite, which had previously been diminished.  However, the patient has been experiencing a painful fungal infection on her hands. She has been applying an ointment for it, but has not been consistent in its use. The patient also reports back pain and intermittent fatigue.  The patient's blood work has been stable, with mild anemia noted. The patient also has impaired kidney function. The patient's last scan was in October 2024. The patient does not have a catheter.        MEDICAL HISTORY: Past Medical History:  Diagnosis Date   Anxiety    Arthritis    hip, lumbar spine    Asthma    seasonal allergies    Bronchitis    Hx: of   Chronic kidney disease    COPD (chronic obstructive pulmonary disease) (HCC)    Coronary artery disease    Depression    Fibromyalgia    Hypertension    Lumbar herniated disc    Myocardial infarction (HCC) 06/25/2012   followed by Dr. Rennis Golden, treated medically, no stents   Peripheral arterial disease (HCC)    of right foot   Pneumonia    Hx: of yrs ago   Sciatica    Sleep apnea    Small bowel obstruction (HCC)    several yrs ago    ALLERGIES:  is allergic  to diclofenac, codeine, hydrocodone, and ciprofloxacin.  MEDICATIONS:  Current Outpatient Medications  Medication Sig Dispense Refill   acetaminophen (TYLENOL) 500 MG tablet Take 1 tablet (500 mg total) by mouth every 6 (six) hours as needed for mild pain. 30 tablet 0   albuterol (PROVENTIL HFA;VENTOLIN HFA) 108 (90 Base) MCG/ACT inhaler Inhale 2 puffs into the lungs every 6 (six) hours as needed for wheezing or shortness of breath. 1 Inhaler 0   amLODipine (NORVASC) 5 MG tablet Take 1 tablet (5 mg  total) by mouth daily. <PLEASE MAKE APPOINTMENT FOR REFILLS> 15 tablet 0   aspirin EC 81 MG tablet Take 81 mg by mouth daily. Swallow whole.     atorvastatin (LIPITOR) 20 MG tablet Take 20 mg by mouth. Monday wed and Friday in am     calcium acetate (PHOSLO) 667 MG tablet Take 1 tablet (667 mg total) by mouth 3 (three) times daily with meals. 15 tablet 0   cilostazol (PLETAL) 100 MG tablet Take 100 mg by mouth 2 (two) times daily.     EPINEPHrine (EPIPEN 2-PAK) 0.3 mg/0.3 mL DEVI Inject 0.3 mLs (0.3 mg total) into the muscle once as needed (for severe allergic reaction). CAll 911 immediately if you have to use this medicine 1 Device 1   erdafitinib (BALVERSA) 4 MG tablet Take 2 tablets (8 mg total) by mouth daily. 60 tablet 3   HYDROcodone-acetaminophen (NORCO/VICODIN) 5-325 MG tablet Take 1 tablet by mouth 3 (three) times daily as needed.     levothyroxine (SYNTHROID) 50 MCG tablet TAKE 1 TABLET BY MOUTH DAILY BEFORE BREAKFAST 90 tablet 1   levothyroxine (SYNTHROID) 88 MCG tablet TAKE 1 TABLET BY MOUTH DAILY BEFORE BREAKFAST. 90 tablet 2   lidocaine-prilocaine (EMLA) cream Apply to the Port-A-Cath site 30-60-minute before treatment 30 g 2   losartan (COZAAR) 100 MG tablet Take 100 mg by mouth daily.     losartan-hydrochlorothiazide (HYZAAR) 100-12.5 MG tablet Take 1 tablet by mouth daily.     methylPREDNISolone (MEDROL DOSEPAK) 4 MG TBPK tablet Use as instructed 21 tablet 0   metoprolol tartrate (LOPRESSOR) 25 MG tablet TAKE 1 TABLET BY MOUTH TWICE A DAY 60 tablet 1   nicotine (NICODERM CQ - DOSED IN MG/24 HOURS) 21 mg/24hr patch Place 21 mg onto the skin daily.     nicotine polacrilex (NICORETTE) 4 MG gum Take 4 mg by mouth as needed for smoking cessation.     nitroGLYCERIN (NITROSTAT) 0.4 MG SL tablet Place 1 tablet (0.4 mg total) under the tongue every 5 (five) minutes x 3 doses as needed for chest pain. 25 tablet 2   oxybutynin (DITROPAN) 5 MG tablet Take 1 tablet (5 mg total) by mouth every 8  (eight) hours as needed for bladder spasms. 30 tablet 1   polyethylene glycol (MIRALAX / GLYCOLAX) 17 g packet Take 17 g by mouth as needed.     prochlorperazine (COMPAZINE) 10 MG tablet Take 1 tablet (10 mg total) by mouth every 6 (six) hours as needed for nausea or vomiting. 30 tablet 0   sodium chloride 1 g tablet Take 1 tablet (1 g total) by mouth 2 (two) times daily with a meal. 20 tablet 0   temazepam (RESTORIL) 15 MG capsule Take 1 capsule (15 mg total) by mouth at bedtime as needed for sleep. 30 capsule 0   traMADol (ULTRAM) 50 MG tablet Take by mouth every 6 (six) hours as needed. Not taking     No current facility-administered medications for this  visit.    SURGICAL HISTORY:  Past Surgical History:  Procedure Laterality Date   ABDOMINAL SURGERY     resection of small intestine   ANTERIOR CRUCIATE LIGAMENT REPAIR Bilateral    BACK SURGERY     fusion of lumbar   CARDIAC CATHETERIZATION  06/25/2012   COLON SURGERY     resection for bowel - for obstruction  6 to 7 yrs ago per pt on 01-05-2022   COLONOSCOPY     Hx; of   DILATION AND CURETTAGE OF UTERUS     EYE SURGERY Bilateral    cataracts removed   HERNIA REPAIR  02/23/1969   inguinal hernia    IR IMAGING GUIDED PORT INSERTION  06/07/2022   LEFT HEART CATHETERIZATION WITH CORONARY ANGIOGRAM N/A 03/30/2013   Procedure: LEFT HEART CATHETERIZATION WITH CORONARY ANGIOGRAM;  Surgeon: Marykay Lex, MD;  Location: [facility] CATH LAB;  Service: Cardiovascular;  Laterality: N/A;   left thumb surgery     yrs ago   ROBOT ASSITED LAPAROSCOPIC NEPHROURETERECTOMY Left 03/09/2021   Procedure: XI ROBOT ASSITED LAPAROSCOPIC NEPHROURETERECTOMY/ CYSTOSCOPY WITH LEFT URETEROSCOPY WITH TRANSURETHRAL RESECTION OF URETERAL ORIFICE;  Surgeon: Rene Paci, MD;  Location: [facility] ORS;  Service: Urology;  Laterality: Left;   SALIVARY GLAND SURGERY Left    approached from inside mouth & side of neck 1970-1980   TONSILLECTOMY     as child    TOTAL HIP ARTHROPLASTY Left 05/28/2014   Procedure: LEFT TOTAL HIP ARTHROPLASTY;  Surgeon: Thera Flake., MD;  Location: [facility] OR;  Service: Orthopedics;  Laterality: Left;   TRANSURETHRAL RESECTION OF BLADDER TUMOR N/A 08/23/2021   Procedure: TRANSURETHRAL RESECTION OF BLADDER TUMOR (TURBT) WITH CYSTOSCOPY/ POSTOPERATIVE INSTILLATION OF GEMCITABINE/retrograde pyelogram and stent placement (right);  Surgeon: Rene Paci, MD;  Location: [facility];  Service: Urology;  Laterality: N/A;   TRANSURETHRAL RESECTION OF BLADDER TUMOR N/A 01/10/2022   Procedure: TRANSURETHRAL RESECTION OF BLADDER TUMOR (TURBT) WITH CYSTOSCOPY / GEMCITABINE INSTILLATION post-operatively;  Surgeon: Rene Paci, MD;  Location: [facility];  Service: Urology;  Laterality: N/A;  ONLY NEEDS 30 MIN    REVIEW OF SYSTEMS:  A comprehensive review of systems was negative except for: Constitutional: positive for fatigue Integument/breast: positive for skin lesion(s) Musculoskeletal: positive for bone pain   PHYSICAL EXAMINATION: General appearance: alert, cooperative, fatigued, and no distress Head: Normocephalic, without obvious abnormality, atraumatic Neck: no adenopathy, no JVD, supple, symmetrical, trachea midline, and thyroid not enlarged, symmetric, no tenderness/mass/nodules Lymph nodes: Cervical, supraclavicular, and axillary nodes normal. Resp: clear to auscultation bilaterally Back: symmetric, no curvature. ROM normal. No CVA tenderness. Cardio: regular rate and rhythm, S1, S2 normal, no murmur, click, rub or gallop GI: soft, non-tender; bowel sounds normal; no masses,  no organomegaly Extremities: extremities normal, atraumatic, no cyanosis or edema  ECOG PERFORMANCE STATUS: 1 - Symptomatic but completely ambulatory  Blood pressure 137/69, pulse 64, temperature (!) 97.2 F (36.2 C), temperature source Temporal, resp. rate 15, height 5\' 8"  (1.727 m), weight 169  lb 8 oz (76.9 kg), SpO2 99%.  LABORATORY DATA: Lab Results  Component Value Date   WBC 6.0 06/17/2023   HGB 11.1 (L) 06/17/2023   HCT 32.3 (L) 06/17/2023   MCV 99.1 06/17/2023   PLT 283 06/17/2023      Chemistry      Component Value Date/Time   NA 134 (L) 06/17/2023 0838   K 4.3 06/17/2023 0838   CL 101 06/17/2023 0838   CO2 26  06/17/2023 0838   BUN 24 (H) 06/17/2023 0838   CREATININE 1.56 (H) 06/17/2023 0838      Component Value Date/Time   CALCIUM 9.3 06/17/2023 0838   ALKPHOS 82 06/17/2023 0838   AST 32 06/17/2023 0838   ALT 28 06/17/2023 0838   BILITOT 0.4 06/17/2023 0838       RADIOGRAPHIC STUDIES: No results found.  ASSESSMENT AND PLAN: This is a very pleasant 72 years old African-American female with metastatic urothelial carcinoma initially diagnosed in September 2022 as a stage II status post right nephro ureterectomy as well as intravesical treatment and resection of superficial tumor in the bladder for several times.  The patient was found to have evidence of metastatic disease in the lung in November 2023.  She underwent palliative systemic chemotherapy with carboplatin and gemcitabine status post 6 cycles with stable disease followed by maintenance treatment with immunotherapy with Avelumab 800 Mg IV every 2 weeks status post 6 cycles.  This treatment was discontinued secondary to disease progression. The patient had repeat CT scan of the chest, abdomen and pelvis performed recently.  I personally and independently reviewed the scan and discussed the result with the patient today.  Unfortunately her scan showed evidence for disease progression with enlargement of bilateral pulmonary nodules. Her molecular studies showed positive FGFR mutation and the patient will be a good candidate for treatment with erdafitinib starting at a dose of 8 mg p.o. daily.  The patient is tolerating her treatment much better now with no concerning complaints except for mild fatigue and  paronychia.    Metastatic Urothelial Carcinoma Metastatic urothelial carcinoma diagnosed in September 2022 with progression in November 2023. Currently on oral idrafatinib 8 mg daily since February 22, 2023. Reports improvement in taste and appetite. Mild anemia noted but well-managed. Kidney function remains a concern. Last scan in October 2024. Discussed the importance of continuing idrafatinib despite side effects and advised to report any new symptoms immediately. - Continue idrafatinib 8 mg daily - Order chest, abdomen, and pelvis scan one week before next visit - Follow-up in one month  Fungal Infection on Hands Reports painful fungal infection on hands. Currently using an ointment but has not applied it recently. Advised to keep hands dry and wear gloves when in contact with water. - Apply antifungal ointment regularly - Keep hands dry and wear gloves when in contact with water  General Health Maintenance None - Encourage good nutrition - Monitor hemoglobin levels.   She was advised to call immediately if she has any other concerning symptoms in the interval.  The patient voices understanding of current disease status and treatment options and is in agreement with the current care plan.  All questions were answered. The patient knows to call the clinic with any problems, questions or concerns. We can certainly see the patient much sooner if necessary.  The total time spent in the appointment was 20 minutes.  Disclaimer: This note was dictated with voice recognition software. Similar sounding words can inadvertently be transcribed and may not be corrected upon review.

## 2023-06-22 DIAGNOSIS — J449 Chronic obstructive pulmonary disease, unspecified: Secondary | ICD-10-CM | POA: Diagnosis not present

## 2023-06-25 ENCOUNTER — Telehealth: Payer: Self-pay | Admitting: Medical Oncology

## 2023-06-25 NOTE — Telephone Encounter (Signed)
 Pt said she is very weak and asking for IVF.  On sat she tested positive for flu at Dr [facility] office and has been drinking pedialyte . Her weakness started after taking first does of antiviral. Denies N/V/D.    I told her the flu can make pts weak and that she needs to ramp up oral fluids to 8-10oz/day.    I told her to start gatorade.   I told her there is a IVF shortage and if she can rest and  increase her oral fluids to at least 8-10 oz /24 hr.she may start to feel better.   I also told her to contact Dr Leigh as the weakness started after taking the antiviral.

## 2023-07-01 NOTE — Telephone Encounter (Signed)
 Oral Oncology Patient Advocate Encounter  Received fax from [employer] that patient must confirm ineligibility for Medicare LIS before re-enrolling in PAP.  Estefana Moellers, CPhT-Adv Oncology Pharmacy Patient Advocate [facility] Direct Number: [phone]  Fax: [phone]

## 2023-07-02 ENCOUNTER — Telehealth: Payer: Self-pay | Admitting: Medical Oncology

## 2023-07-02 NOTE — Telephone Encounter (Signed)
"   I feel terrible"   She did talk to Dr Loleta Chance last week after I instructed her to . She said he told her to keep pushing fluids and if not feeling better to go to ED. "I am about over the cold".  She denied fever, cough is better.

## 2023-07-09 ENCOUNTER — Ambulatory Visit (HOSPITAL_COMMUNITY)
Admission: RE | Admit: 2023-07-09 | Discharge: 2023-07-09 | Disposition: A | Discharge status: Home / Self Care | Payer: Medicare Other | Source: Ambulatory Visit | Attending: Internal Medicine | Admitting: Internal Medicine

## 2023-07-09 ENCOUNTER — Inpatient Hospital Stay: Payer: Medicare Other

## 2023-07-09 ENCOUNTER — Other Ambulatory Visit: Payer: Medicare Other

## 2023-07-09 ENCOUNTER — Other Ambulatory Visit: Payer: Self-pay | Admitting: Internal Medicine

## 2023-07-09 DIAGNOSIS — Z9226 Personal history of immune checkpoint inhibitor therapy: Secondary | ICD-10-CM | POA: Insufficient documentation

## 2023-07-09 DIAGNOSIS — C679 Malignant neoplasm of bladder, unspecified: Secondary | ICD-10-CM | POA: Insufficient documentation

## 2023-07-09 DIAGNOSIS — C7800 Secondary malignant neoplasm of unspecified lung: Secondary | ICD-10-CM | POA: Diagnosis not present

## 2023-07-09 DIAGNOSIS — R918 Other nonspecific abnormal finding of lung field: Secondary | ICD-10-CM | POA: Insufficient documentation

## 2023-07-09 DIAGNOSIS — E871 Hypo-osmolality and hyponatremia: Secondary | ICD-10-CM | POA: Insufficient documentation

## 2023-07-09 DIAGNOSIS — Z885 Allergy status to narcotic agent status: Secondary | ICD-10-CM | POA: Insufficient documentation

## 2023-07-09 DIAGNOSIS — C7910 Secondary malignant neoplasm of unspecified urinary organs: Secondary | ICD-10-CM

## 2023-07-09 DIAGNOSIS — I252 Old myocardial infarction: Secondary | ICD-10-CM | POA: Insufficient documentation

## 2023-07-09 DIAGNOSIS — Z7902 Long term (current) use of antithrombotics/antiplatelets: Secondary | ICD-10-CM | POA: Insufficient documentation

## 2023-07-09 DIAGNOSIS — Z95828 Presence of other vascular implants and grafts: Secondary | ICD-10-CM

## 2023-07-09 DIAGNOSIS — D174 Benign lipomatous neoplasm of intrathoracic organs: Secondary | ICD-10-CM | POA: Insufficient documentation

## 2023-07-09 DIAGNOSIS — R630 Anorexia: Secondary | ICD-10-CM | POA: Insufficient documentation

## 2023-07-09 DIAGNOSIS — Z881 Allergy status to other antibiotic agents status: Secondary | ICD-10-CM | POA: Insufficient documentation

## 2023-07-09 DIAGNOSIS — Z79899 Other long term (current) drug therapy: Secondary | ICD-10-CM | POA: Insufficient documentation

## 2023-07-09 DIAGNOSIS — Z905 Acquired absence of kidney: Secondary | ICD-10-CM | POA: Insufficient documentation

## 2023-07-09 DIAGNOSIS — D171 Benign lipomatous neoplasm of skin and subcutaneous tissue of trunk: Secondary | ICD-10-CM | POA: Diagnosis not present

## 2023-07-09 DIAGNOSIS — J439 Emphysema, unspecified: Secondary | ICD-10-CM | POA: Diagnosis not present

## 2023-07-09 LAB — CMP (CANCER CENTER ONLY)
ALT: 19 U/L (ref 0–44)
AST: 28 U/L (ref 15–41)
Albumin: 3.5 g/dL (ref 3.5–5.0)
Alkaline Phosphatase: 84 U/L (ref 38–126)
Anion gap: 8 (ref 5–15)
BUN: 8 mg/dL (ref 8–23)
CO2: 25 mmol/L (ref 22–32)
Calcium: 9.6 mg/dL (ref 8.9–10.3)
Chloride: 88 mmol/L — ABNORMAL LOW (ref 98–111)
Creatinine: 1.41 mg/dL — ABNORMAL HIGH (ref 0.44–1.00)
GFR, Estimated: 39 mL/min — ABNORMAL LOW (ref >60)
Glucose, Bld: 97 mg/dL (ref 70–99)
Potassium: 4.7 mmol/L (ref 3.5–5.1)
Sodium: 121 mmol/L — ABNORMAL LOW (ref 135–145)
Total Bilirubin: 0.3 mg/dL (ref 0.0–1.2)
Total Protein: 6.4 g/dL — ABNORMAL LOW (ref 6.5–8.1)

## 2023-07-09 LAB — CBC WITH DIFFERENTIAL (CANCER CENTER ONLY)
Abs Immature Granulocytes: 0.02 10*3/uL (ref 0.00–0.07)
Basophils Absolute: 0.1 10*3/uL (ref 0.0–0.1)
Basophils Relative: 1 %
Eosinophils Absolute: 0.1 10*3/uL (ref 0.0–0.5)
Eosinophils Relative: 2 %
HCT: 30.2 % — ABNORMAL LOW (ref 36.0–46.0)
Hemoglobin: 10.7 g/dL — ABNORMAL LOW (ref 12.0–15.0)
Immature Granulocytes: 0 %
Lymphocytes Relative: 47 %
Lymphs Abs: 2.6 10*3/uL (ref 0.7–4.0)
MCH: 33 pg (ref 26.0–34.0)
MCHC: 35.4 g/dL (ref 30.0–36.0)
MCV: 93.2 fL (ref 80.0–100.0)
Monocytes Absolute: 0.4 10*3/uL (ref 0.1–1.0)
Monocytes Relative: 7 %
Neutro Abs: 2.4 10*3/uL (ref 1.7–7.7)
Neutrophils Relative %: 43 %
Platelet Count: 393 10*3/uL (ref 150–400)
RBC: 3.24 MIL/uL — ABNORMAL LOW (ref 3.87–5.11)
RDW: 12.9 % (ref 11.5–15.5)
WBC Count: 5.6 10*3/uL (ref 4.0–10.5)
nRBC: 0 % (ref 0.0–0.2)

## 2023-07-09 LAB — PHOSPHORUS: Phosphorus: 4.4 mg/dL (ref 2.5–4.6)

## 2023-07-09 MED ORDER — SODIUM CHLORIDE 0.9% FLUSH
10.0000 mL | Freq: Once | INTRAVENOUS | Status: AC
  Administered 2023-07-09: 10 mL

## 2023-07-09 MED ORDER — HEPARIN SOD (PORK) LOCK FLUSH 100 UNIT/ML IV SOLN
500.0000 [IU] | Freq: Once | INTRAVENOUS | Status: AC
  Administered 2023-07-09: 500 [IU]

## 2023-07-10 ENCOUNTER — Telehealth: Payer: Self-pay

## 2023-07-10 ENCOUNTER — Other Ambulatory Visit: Payer: Self-pay | Admitting: Physician Assistant

## 2023-07-10 DIAGNOSIS — E871 Hypo-osmolality and hyponatremia: Secondary | ICD-10-CM

## 2023-07-10 MED ORDER — SODIUM CHLORIDE 1 G PO TABS: 1.0000 g | ORAL_TABLET | Freq: Two times a day (BID) | ORAL | 0 refills | Status: DC

## 2023-07-10 NOTE — Telephone Encounter (Signed)
 Spoke with patient in regards to lab results and appts.  Per Cassie, PA- sodium is low. Sent in sodium chloride  tablet to pharmacy to take 2 times daily. Recheck labs Monday. Informed patient to drink gatorade or body armor for fluids. Port flush with lab appt made for Monday.  Patient verbalized understanding.

## 2023-07-12 NOTE — Telephone Encounter (Signed)
Oral Oncology Patient Advocate Encounter  Called to check status of PAP renewal. [employer] representative Damaya checked the patient's case and informed me the patient is under reverification. This process usually takes 1-2 business days.  I will continue to follow up until final determination.  Jinger Neighbors, CPhT-Adv Oncology Pharmacy Patient Advocate [facility] Direct Number: [phone]  Fax: [phone]

## 2023-07-14 NOTE — Progress Notes (Unsigned)
[facility] OFFICE PROGRESS NOTE  [name], MD [address] [place] [address]  DIAGNOSIS: Metastatic urothelial carcinoma that was initially diagnosed as stage II (T2, N0, M0) in September 2022 status post right nephroureterectomy as well as intravesical treatment and resection of superficial tumor in the bladder several times and March 2023 as well as July 2023.  The patient was found to have enlarging and new pulmonary nodules consistent with metastatic disease in November 2023.   PRIOR THERAPY: 1) Right nephroureterectomy as well as intravesical treatment and resection of superficial tumor in the bladder several times and March 2023 as well as July 2023.  2) Palliative systemic chemotherapy with carboplatin for AUC of 5 on day 1 and gemcitabine 1000 mg/M2 on days 1 and 8 every 3 weeks. First cycle June 21, 2022. Status post 6 cycles. Her dose of carboplatin was reduced to AUC of 4 and gemcitabine to 800 mg/m2 starting from cycle #5 due to cytopenias. 3) Maintenance immunotherapy with Avelumab 800 mg IV every 2 weeks. First dose on 11/15/22. Status post 6 cycle of treatment.  CURRENT THERAPY:  Erdafitinib (balversa) 8 mg p.o daily. First dose 02/22/23.  Status post 5 months of treatment.   INTERVAL HISTORY: Morgan Torres 73 y.o. female returns to the clinic today for a follow up visit. The patient is followed for her bladder cancer. In the interval since being seen, she has had the flu and had significant fatigue and weakness. She tested positive for flu on 06/25/23.   However, within the last week she is having even worsening fatigue and weakness where she states it is hard to get around at home.  She had lab work performed prior to his CT scan last week that showed worsening hyponatremia.  She did have some hyponatremia back in November 2024 and was taking salt tablets.  Her sodium then improved to the 130's. Her sodium was 134 on 12/23 and dropped to 121 on 1/14.  Her salt tablets were restarted late last week and she states she is taking these twice a day as directed. Despite this, her sodium is worsening today at 119.  Besides weakness, she denies any confusion or seizures.   She is currently on treatment with Balversa and tolerates this fair except for poor appetite. Her weight has been stable since last being seen. She reports she has had some diarrhea the last few days. It is not daily. She estimates this occurs every other day or so. She may have 3 loose stools. She started taking imodium which she believes helps. She estimates she drinks about 2-3 water bottles per day. She also drinks tea and Pedialyte.    Denies any fever, chills, night sweats, or weight loss. Denies any chest pain or hemoptysis. She did not mention any breathing changes. She denies nausea/vomiting.  Denies any headache or visual changes except her left eye has been more watery recently. She is supposed to have monthly eye exams while on Balversa, however, she had to reschedule this due to having upcoming procedure for a cyst on her breast. Denies any rashes or skin changes. She recently had a restaging CT scan. The patient is here today for evaluation, repeat blood work, and to review her scan results.  Marland Kitchen  MEDICAL HISTORY: Past Medical History:  Diagnosis Date   Anxiety    Arthritis    hip, lumbar spine    Asthma    seasonal allergies    Bronchitis  Hx: of   Chronic kidney disease    COPD (chronic obstructive pulmonary disease) (HCC)    Coronary artery disease    Depression    Fibromyalgia    Hypertension    Lumbar herniated disc    Myocardial infarction (HCC) 06/25/2012   followed by Dr. Rennis Golden, treated medically, no stents   Peripheral arterial disease (HCC)    of right foot   Pneumonia    Hx: of yrs ago   Sciatica    Sleep apnea    Small bowel obstruction (HCC)    several yrs ago    ALLERGIES:  is allergic to diclofenac, codeine, hydrocodone, and  ciprofloxacin.  MEDICATIONS:  Current Outpatient Medications  Medication Sig Dispense Refill   acetaminophen (TYLENOL) 500 MG tablet Take 1 tablet (500 mg total) by mouth every 6 (six) hours as needed for mild pain. 30 tablet 0   albuterol (PROVENTIL HFA;VENTOLIN HFA) 108 (90 Base) MCG/ACT inhaler Inhale 2 puffs into the lungs every 6 (six) hours as needed for wheezing or shortness of breath. 1 Inhaler 0   amLODipine (NORVASC) 5 MG tablet Take 1 tablet (5 mg total) by mouth daily. <PLEASE MAKE APPOINTMENT FOR REFILLS> 15 tablet 0   aspirin EC 81 MG tablet Take 81 mg by mouth daily. Swallow whole.     atorvastatin (LIPITOR) 20 MG tablet Take 20 mg by mouth. Monday wed and Friday in am     calcium acetate (PHOSLO) 667 MG tablet Take 1 tablet (667 mg total) by mouth 3 (three) times daily with meals. 15 tablet 0   cilostazol (PLETAL) 100 MG tablet Take 100 mg by mouth 2 (two) times daily.     EPINEPHrine (EPIPEN 2-PAK) 0.3 mg/0.3 mL DEVI Inject 0.3 mLs (0.3 mg total) into the muscle once as needed (for severe allergic reaction). CAll 911 immediately if you have to use this medicine 1 Device 1   erdafitinib (BALVERSA) 4 MG tablet Take 2 tablets (8 mg total) by mouth daily. 60 tablet 3   HYDROcodone-acetaminophen (NORCO/VICODIN) 5-325 MG tablet Take 1 tablet by mouth 3 (three) times daily as needed.     levothyroxine (SYNTHROID) 50 MCG tablet TAKE 1 TABLET BY MOUTH DAILY BEFORE BREAKFAST 90 tablet 1   levothyroxine (SYNTHROID) 88 MCG tablet TAKE 1 TABLET BY MOUTH DAILY BEFORE BREAKFAST. 90 tablet 2   lidocaine-prilocaine (EMLA) cream Apply to the Port-A-Cath site 30-60-minute before treatment 30 g 2   losartan (COZAAR) 100 MG tablet Take 100 mg by mouth daily.     losartan-hydrochlorothiazide (HYZAAR) 100-12.5 MG tablet Take 1 tablet by mouth daily.     methylPREDNISolone (MEDROL DOSEPAK) 4 MG TBPK tablet Use as instructed 21 tablet 0   metoprolol tartrate (LOPRESSOR) 25 MG tablet TAKE 1 TABLET BY  MOUTH TWICE A DAY 60 tablet 1   nicotine (NICODERM CQ - DOSED IN MG/24 HOURS) 21 mg/24hr patch Place 21 mg onto the skin daily.     nicotine polacrilex (NICORETTE) 4 MG gum Take 4 mg by mouth as needed for smoking cessation.     nitroGLYCERIN (NITROSTAT) 0.4 MG SL tablet Place 1 tablet (0.4 mg total) under the tongue every 5 (five) minutes x 3 doses as needed for chest pain. 25 tablet 2   oxybutynin (DITROPAN) 5 MG tablet Take 1 tablet (5 mg total) by mouth every 8 (eight) hours as needed for bladder spasms. 30 tablet 1   polyethylene glycol (MIRALAX / GLYCOLAX) 17 g packet Take 17 g by mouth as needed.  prochlorperazine (COMPAZINE) 10 MG tablet Take 1 tablet (10 mg total) by mouth every 6 (six) hours as needed for nausea or vomiting. 30 tablet 0   sodium chloride 1 g tablet Take 1 tablet (1 g total) by mouth 2 (two) times daily with a meal. 30 tablet 0   temazepam (RESTORIL) 15 MG capsule Take 1 capsule (15 mg total) by mouth at bedtime as needed for sleep. 30 capsule 0   traMADol (ULTRAM) 50 MG tablet Take by mouth every 6 (six) hours as needed. Not taking     No current facility-administered medications for this visit.    SURGICAL HISTORY:  Past Surgical History:  Procedure Laterality Date   ABDOMINAL SURGERY     resection of small intestine   ANTERIOR CRUCIATE LIGAMENT REPAIR Bilateral    BACK SURGERY     fusion of lumbar   CARDIAC CATHETERIZATION  06/25/2012   COLON SURGERY     resection for bowel - for obstruction  6 to 7 yrs ago per pt on 01-05-2022   COLONOSCOPY     Hx; of   DILATION AND CURETTAGE OF UTERUS     EYE SURGERY Bilateral    cataracts removed   HERNIA REPAIR  02/23/1969   inguinal hernia    IR IMAGING GUIDED PORT INSERTION  06/07/2022   LEFT HEART CATHETERIZATION WITH CORONARY ANGIOGRAM N/A 03/30/2013   Procedure: LEFT HEART CATHETERIZATION WITH CORONARY ANGIOGRAM;  Surgeon: Marykay Lex, MD;  Location: [facility] CATH LAB;  Service: Cardiovascular;  Laterality: N/A;    left thumb surgery     yrs ago   ROBOT ASSITED LAPAROSCOPIC NEPHROURETERECTOMY Left 03/09/2021   Procedure: XI ROBOT ASSITED LAPAROSCOPIC NEPHROURETERECTOMY/ CYSTOSCOPY WITH LEFT URETEROSCOPY WITH TRANSURETHRAL RESECTION OF URETERAL ORIFICE;  Surgeon: Rene Paci, MD;  Location: [facility] ORS;  Service: Urology;  Laterality: Left;   SALIVARY GLAND SURGERY Left    approached from inside mouth & side of neck 1970-1980   TONSILLECTOMY     as child   TOTAL HIP ARTHROPLASTY Left 05/28/2014   Procedure: LEFT TOTAL HIP ARTHROPLASTY;  Surgeon: Thera Flake., MD;  Location: [facility] OR;  Service: Orthopedics;  Laterality: Left;   TRANSURETHRAL RESECTION OF BLADDER TUMOR N/A 08/23/2021   Procedure: TRANSURETHRAL RESECTION OF BLADDER TUMOR (TURBT) WITH CYSTOSCOPY/ POSTOPERATIVE INSTILLATION OF GEMCITABINE/retrograde pyelogram and stent placement (right);  Surgeon: Rene Paci, MD;  Location: [facility];  Service: Urology;  Laterality: N/A;   TRANSURETHRAL RESECTION OF BLADDER TUMOR N/A 01/10/2022   Procedure: TRANSURETHRAL RESECTION OF BLADDER TUMOR (TURBT) WITH CYSTOSCOPY / GEMCITABINE INSTILLATION post-operatively;  Surgeon: Rene Paci, MD;  Location: [facility];  Service: Urology;  Laterality: N/A;  ONLY NEEDS 30 MIN    REVIEW OF SYSTEMS:   Constitutional: Positive for persistent fatigue, poor appetite, and generalized weakness. Negative for chills or fever and unexpected weight change.  HENT: Negative for mouth sores, nosebleeds, sore throat and trouble swallowing.   Eyes: Negative for eye problems and icterus.  Respiratory: Negative for cough, hemoptysis, shortness of breath and wheezing.   Cardiovascular: Negative for chest pain and leg swelling.  Gastrointestinal: Positive for diarrhea. Negative for abdominal pain, constipation, nausea and vomiting.  Genitourinary: Negative for bladder incontinence, difficulty urinating, dysuria,  frequency and hematuria.   Musculoskeletal: Positive for chronic back pain. Negative for gait problem, neck pain and neck stiffness.  Skin: Negative for rash or itching.  Neurological: Negative for dizziness, extremity weakness, gait problem, headaches, light-headedness and seizures.  Hematological: Negative for adenopathy. Does not bruise/bleed easily.  Psychiatric/Behavioral: Positive for depressed mood. Negative for confusion and sleep disturbance. The patient is not nervous/anxious.      PHYSICAL EXAMINATION:  Blood pressure 124/78, pulse 65, temperature 98.2 F (36.8 C), temperature source Temporal, resp. rate 13, weight 169 lb 12.8 oz (77 kg), SpO2 95%.  ECOG PERFORMANCE STATUS: 2  Physical Exam  Constitutional: Oriented to person, place, and time and chronically ill appearing, and in no distress.   HENT:  Head: Normocephalic and atraumatic.  Mouth/Throat: Oropharynx is clear and moist. No oropharyngeal exudate.  Eyes: Conjunctivae are normal. Right eye exhibits no discharge. Left eye exhibits no discharge. No scleral icterus.  Neck: Normal range of motion. Neck supple.  Cardiovascular: Normal rate, regular rhythm, normal heart sounds and intact distal pulses.   Pulmonary/Chest: Effort normal and breath sounds normal. No respiratory distress. No wheezes. No rales.  Abdominal: Soft. Bowel sounds are normal. Exhibits no distension and no mass. There is no tenderness.  Musculoskeletal: Normal range of motion. Stable bilateral lower extremity anemia.  Lymphadenopathy:    No cervical adenopathy.  Neurological: Alert and oriented to person, place, and time. Exhibits muscle wasting. Gait normal. Coordination normal.  Skin: Skin is warm and dry. No rash noted. Not diaphoretic. No erythema. No pallor.  Psychiatric: Mood, memory and judgment normal.  Vitals reviewed.  LABORATORY DATA: Lab Results  Component Value Date   WBC 5.2 07/15/2023   HGB 10.1 (L) 07/15/2023   HCT 27.8 (L)  07/15/2023   MCV 91.7 07/15/2023   PLT 278 07/15/2023      Chemistry      Component Value Date/Time   NA 119 (LL) 07/15/2023 1412   K 4.7 07/15/2023 1412   CL 88 (L) 07/15/2023 1412   CO2 25 07/15/2023 1412   BUN 9 07/15/2023 1412   CREATININE 1.61 (H) 07/15/2023 1412      Component Value Date/Time   CALCIUM 9.2 07/15/2023 1412   ALKPHOS 85 07/15/2023 1412   AST 31 07/15/2023 1412   ALT 24 07/15/2023 1412   BILITOT 0.4 07/15/2023 1412       RADIOGRAPHIC STUDIES:  CT CHEST ABDOMEN PELVIS WO CONTRAST Result Date: 07/10/2023 CLINICAL DATA:  History of bladder cancer, restaging. * Tracking Code: BO * EXAM: CT CHEST, ABDOMEN AND PELVIS WITHOUT CONTRAST TECHNIQUE: Multidetector CT imaging of the chest, abdomen and pelvis was performed following the standard protocol without IV contrast. RADIATION DOSE REDUCTION: This exam was performed according to the departmental dose-optimization program which includes automated exposure control, adjustment of the mA and/or kV according to patient size and/or use of iterative reconstruction technique. COMPARISON:  Multiple priors including most recent CT April 22, 2023 FINDINGS: CT CHEST FINDINGS Cardiovascular: Extensive aortic atherosclerosis. Coronary artery calcifications lipomatous hypertrophy of the intra-atrial septum normal size heart. Right chest Port-A-Cath with tip at the superior cavoatrial junction Mediastinum/Nodes: No suspicious thyroid nodule. Enlarged mediastinal lymph nodes for instance a low right paratracheal/pre carinal lymph node measuring 15 mm in short axis on image 25/2 previously 6 mm Lungs/Pleura: Increased size of the dominant pulmonary nodule in the superior segment of the left lower lobe measuring 15 mm on image 54/4 previously 13 mm. Other scattered smaller pulmonary nodules are stable for instance a 4 mm nodule in the left upper lobe on image 35/4. New clustered nodularity in the right upper lobe on image 78/4 and right  lower lobe for instance on image 125/4. New nodular/linear opacity in the right lower  lobe with a nodular focus measuring up to 10 mm on image 121/4. Emphysematous change. Scattered atelectasis/scarring. No pleural effusion. No pneumothorax. Musculoskeletal: No aggressive lytic or blastic lesion of bone. Multilevel degenerative change of the spine. Degenerative change of the bilateral shoulders CT ABDOMEN PELVIS FINDINGS Hepatobiliary: Similar too small to accurately characterize hypodensities in the inferior right lobe of the liver on image 70/2 and in the left lobe of the liver measuring 8 mm on image 60/2. Gallbladder is unremarkable. No biliary ductal dilation. Pancreas: No pancreatic ductal dilation or evidence of acute inflammation. Spleen: No splenomegaly Adrenals/Urinary Tract: No suspicious adrenal nodule. Prior left nephrectomy without new suspicious nodularity in the nephrectomy bed. No right-sided hydronephrosis or nephrolithiasis. Urinary bladder is unremarkable for degree of distension within limitation of streak artifact from left hip arthroplasty. Stomach/Bowel: Stomach is within normal limits. No evidence of bowel wall thickening, distention, or inflammatory changes. Vascular/Lymphatic: Aortic and branch vessel atherosclerosis. Smooth IVC contours. No pathologically enlarged abdominal or pelvic lymph nodes Reproductive: Status post hysterectomy. No adnexal masses. Other: No significant abdominopelvic free fluid. Musculoskeletal: No aggressive lytic or blastic lesion of bone. Severe multilevel degenerative change of the spine. Lumbosacral fixation hardware. Left total hip arthroplasty. IMPRESSION: 1. Increased size of the dominant pulmonary nodule in the superior segment of the left lower lobe, other scattered smaller pulmonary nodules are not significantly changed. 2. New clustered nodularity in the right upper lobe and right lower lobe with a nodular focus measuring up to 10 mm in the right lower  lobe, possibly infectious or inflammatory. Suggest attention on short-term interval follow-up CT. 3. Enlarged mediastinal lymph nodes, nonspecific may reflect nodal metastatic disease involvement or reactive. Attention on short-term interval follow-up suggested. 4. Prior left nephrectomy without evidence of local recurrence. 5. No evidence of metastatic disease in the abdomen or pelvis. Electronically Signed   By: Maudry Mayhew M.D.   On: 07/10/2023 10:57     ASSESSMENT/PLAN:  This is a very pleasant 73 year old African-American female with metastatic urothelial carcinoma.  She was initially diagnosed as a stage II (T2, N0, M0) in September 2022.    She is status post right nephroureterectomy as well as  intravesical treatment and resection of superficial tumor in the bladder several times and March 2023 as well as July 2023.  The patient was found to have enlarging and new pulmonary nodules consistent with metastatic disease in November 2023.   Dr. Arbutus Ped sent her tissue for foundation 1 and PD-L1 expression. She does not have any actionable mutations   She completed 6 cycles of systemic chemotherapy with carboplatin for an AUC of 5 on day 1 and gemcitabine 1000 milligrams per meter squared on days 1 and 8 IV every 3 weeks with Neulasta support.  Her dose was reduced to carboplatin for an AUC of 4 and gemcitabine 800 mg/m due to cytopenias transfusion support.  Dr. Arbutus Ped recommended maintenance immunotherapy with Avelumab 2 mg IV every 2 weeks.  She is status post 6 cycles of treatment and she tolerated it well without any concerning adverse side.    She had repeat imaging in August 2024 showing interval enlargement of bilateral pulmonary nodules consistent with progressive metastatic disease.    She started on EGFR therapy with Balversa 8 milligrams p.o. daily. She started this on 02/22/23. Thus far, she has tolerated this fair.    The patient was seen with Dr. Arbutus Ped today.  Dr. Arbutus Ped  personally and independently reviewed the scan and discussed results with the patient  today.  The scan showed some increase in size of the dominant pulmonary nodule of the left lower lobe. There is enlarged mediastinal lymph nodes that are nonspecific that may be due to  nodal metastatic disease vs reactive. The other scattered smaller pulmonary nodules are not significantly changed. However, there is a new cluster of nodularity in the right lower lobe and right lower lobe. The patient did have the flu a few weeks ago. To further evaluate this, we will arrange for PET scan to be performed in about 3 weeks or so.   In the meantime, her labs today show critically low sodium of 119. She also is very weak. Therefore, we will sent the patient to the ER.   We will see her back in 3 weeks for evaluation and to review her PET scan results   The patient was advised to call immediately if she has any concerning symptoms in the interval. The patient voices understanding of current disease status and treatment options and is in agreement with the current care plan. All questions were answered. The patient knows to call the clinic with any problems, questions or concerns. We can certainly see the patient much sooner if necessary      Orders Placed This Encounter  Procedures   NM PET Image Restage (PS) Skull Base to Thigh (F-18 FDG)    Standing Status:   Future    Expected Date:   08/02/2023    Expiration Date:   07/14/2024    If indicated for the ordered procedure, I authorize the administration of a radiopharmaceutical per Radiology protocol:   Yes    Preferred imaging location?:   [facility]   CBC with Differential (Cancer Center Only)    Standing Status:   Future    Expected Date:   08/05/2023    Expiration Date:   07/14/2024   CMP (Cancer Center only)    Standing Status:   Future    Expected Date:   08/05/2023    Expiration Date:   07/14/2024       Johnette Abraham Kolbe [place],  PA-C 07/15/23  ADDENDUM: Hematology/Oncology Attending: I had a face-to-face encounter with the patient today.  I reviewed her records, lab, scan and recommended her care plan.  This is a very pleasant 73 years old African-American female with metastatic urothelial carcinoma initially diagnosed as stage II in September 2022 with evidence of metastasis in November 2023.  She is status post right nephroureterectomy as well as intravesical treatment and resection of superficial tumor in 2023.  The patient then started palliative systemic chemotherapy with carboplatin and gemcitabine discontinued after 6 cycles followed by maintenance treatment with Avelumab was continued secondary to disease progression.  The patient then started treatment with erdafitinib 8 mg p.o. daily since August 2024 and she has been tolerating her treatment fairly well except for fatigue.  Recently she has been complaining of increasing fatigue and weakness as well as chest congestion and she was tested positive for the flu on June 25, 2023.  She has not recovered yet from her flu symptoms.  She was noted to have significant hyponatremia despite treatment with salt tablets.  The patient had repeat CT scan of the chest, abdomen and pelvis performed recently.  I personally and independently reviewed the scan and discussed the result with the patient today.  Her scan showed mild disease progression and some of the sternal lymph nodes and pulmonary nodules but this is unclear if it is related to the inflammatory  process that has been going on or evidence for disease progression. We recommended for her to go to the hospital for further evaluation and management of her hyponatremia and significant weakness and fatigue. Regarding the disease progression, we will arrange for the patient to have repeat PET scan for further evaluation of her disease and identification of disease progression versus inflammatory process. The patient will come  back for follow-up visit in 4 weeks for evaluation and management of her condition.  She will hold her treatment with erdafitinib during her hospitalization but resume after discharge. The patient was advised to call immediately if she has any other concerning symptoms in the interval. The total time spent in the appointment was 30 minutes. Disclaimer: This note was dictated with voice recognition software. Similar sounding words can inadvertently be transcribed and may be missed upon review. Lajuana Matte, MD

## 2023-07-15 ENCOUNTER — Other Ambulatory Visit: Payer: Self-pay

## 2023-07-15 ENCOUNTER — Emergency Department (HOSPITAL_COMMUNITY): Payer: Medicare Other

## 2023-07-15 ENCOUNTER — Inpatient Hospital Stay (HOSPITAL_BASED_OUTPATIENT_CLINIC_OR_DEPARTMENT_OTHER): Payer: Medicare Other | Admitting: Physician Assistant

## 2023-07-15 ENCOUNTER — Inpatient Hospital Stay (HOSPITAL_COMMUNITY)
Admission: EM | Admit: 2023-07-15 | Discharge: 2023-07-20 | DRG: 643 | Disposition: A | Discharge status: Home Health | Payer: Medicare Other | Source: Ambulatory Visit | Attending: Internal Medicine | Admitting: Internal Medicine

## 2023-07-15 ENCOUNTER — Inpatient Hospital Stay: Payer: Medicare Other

## 2023-07-15 ENCOUNTER — Encounter (HOSPITAL_COMMUNITY): Payer: Self-pay

## 2023-07-15 VITALS — BP 124/78 | HR 65 | Temp 98.2°F | Resp 13 | Wt 169.8 lb

## 2023-07-15 DIAGNOSIS — E871 Hypo-osmolality and hyponatremia: Secondary | ICD-10-CM | POA: Diagnosis present

## 2023-07-15 DIAGNOSIS — E861 Hypovolemia: Secondary | ICD-10-CM | POA: Diagnosis present

## 2023-07-15 DIAGNOSIS — N1832 Chronic kidney disease, stage 3b: Secondary | ICD-10-CM | POA: Diagnosis present

## 2023-07-15 DIAGNOSIS — Z96642 Presence of left artificial hip joint: Secondary | ICD-10-CM | POA: Diagnosis not present

## 2023-07-15 DIAGNOSIS — Z95828 Presence of other vascular implants and grafts: Secondary | ICD-10-CM

## 2023-07-15 DIAGNOSIS — F17210 Nicotine dependence, cigarettes, uncomplicated: Secondary | ICD-10-CM | POA: Diagnosis present

## 2023-07-15 DIAGNOSIS — R531 Weakness: Secondary | ICD-10-CM | POA: Diagnosis not present

## 2023-07-15 DIAGNOSIS — Z833 Family history of diabetes mellitus: Secondary | ICD-10-CM | POA: Diagnosis not present

## 2023-07-15 DIAGNOSIS — C679 Malignant neoplasm of bladder, unspecified: Secondary | ICD-10-CM | POA: Diagnosis present

## 2023-07-15 DIAGNOSIS — I252 Old myocardial infarction: Secondary | ICD-10-CM | POA: Diagnosis not present

## 2023-07-15 DIAGNOSIS — C7800 Secondary malignant neoplasm of unspecified lung: Secondary | ICD-10-CM | POA: Diagnosis not present

## 2023-07-15 DIAGNOSIS — Z7982 Long term (current) use of aspirin: Secondary | ICD-10-CM

## 2023-07-15 DIAGNOSIS — D649 Anemia, unspecified: Secondary | ICD-10-CM | POA: Diagnosis present

## 2023-07-15 DIAGNOSIS — M797 Fibromyalgia: Secondary | ICD-10-CM | POA: Diagnosis present

## 2023-07-15 DIAGNOSIS — I429 Cardiomyopathy, unspecified: Secondary | ICD-10-CM | POA: Diagnosis not present

## 2023-07-15 DIAGNOSIS — G4700 Insomnia, unspecified: Secondary | ICD-10-CM | POA: Diagnosis present

## 2023-07-15 DIAGNOSIS — Z7902 Long term (current) use of antithrombotics/antiplatelets: Secondary | ICD-10-CM

## 2023-07-15 DIAGNOSIS — Z8249 Family history of ischemic heart disease and other diseases of the circulatory system: Secondary | ICD-10-CM

## 2023-07-15 DIAGNOSIS — I129 Hypertensive chronic kidney disease with stage 1 through stage 4 chronic kidney disease, or unspecified chronic kidney disease: Secondary | ICD-10-CM | POA: Diagnosis present

## 2023-07-15 DIAGNOSIS — I739 Peripheral vascular disease, unspecified: Secondary | ICD-10-CM | POA: Diagnosis not present

## 2023-07-15 DIAGNOSIS — J180 Bronchopneumonia, unspecified organism: Secondary | ICD-10-CM | POA: Diagnosis present

## 2023-07-15 DIAGNOSIS — M129 Arthropathy, unspecified: Secondary | ICD-10-CM | POA: Diagnosis not present

## 2023-07-15 DIAGNOSIS — R918 Other nonspecific abnormal finding of lung field: Secondary | ICD-10-CM | POA: Diagnosis not present

## 2023-07-15 DIAGNOSIS — R059 Cough, unspecified: Secondary | ICD-10-CM | POA: Diagnosis not present

## 2023-07-15 DIAGNOSIS — Z803 Family history of malignant neoplasm of breast: Secondary | ICD-10-CM

## 2023-07-15 DIAGNOSIS — E039 Hypothyroidism, unspecified: Secondary | ICD-10-CM | POA: Diagnosis present

## 2023-07-15 DIAGNOSIS — Z79890 Hormone replacement therapy (postmenopausal): Secondary | ICD-10-CM

## 2023-07-15 DIAGNOSIS — F32A Depression, unspecified: Secondary | ICD-10-CM | POA: Diagnosis present

## 2023-07-15 DIAGNOSIS — Z79899 Other long term (current) drug therapy: Secondary | ICD-10-CM

## 2023-07-15 DIAGNOSIS — C771 Secondary and unspecified malignant neoplasm of intrathoracic lymph nodes: Secondary | ICD-10-CM | POA: Diagnosis not present

## 2023-07-15 DIAGNOSIS — I2510 Atherosclerotic heart disease of native coronary artery without angina pectoris: Secondary | ICD-10-CM | POA: Diagnosis not present

## 2023-07-15 DIAGNOSIS — E222 Syndrome of inappropriate secretion of antidiuretic hormone: Secondary | ICD-10-CM | POA: Diagnosis not present

## 2023-07-15 DIAGNOSIS — Z8619 Personal history of other infectious and parasitic diseases: Secondary | ICD-10-CM

## 2023-07-15 DIAGNOSIS — R001 Bradycardia, unspecified: Secondary | ICD-10-CM | POA: Diagnosis present

## 2023-07-15 DIAGNOSIS — Z888 Allergy status to other drugs, medicaments and biological substances status: Secondary | ICD-10-CM

## 2023-07-15 DIAGNOSIS — I959 Hypotension, unspecified: Secondary | ICD-10-CM | POA: Diagnosis present

## 2023-07-15 DIAGNOSIS — Z881 Allergy status to other antibiotic agents status: Secondary | ICD-10-CM

## 2023-07-15 DIAGNOSIS — J440 Chronic obstructive pulmonary disease with acute lower respiratory infection: Secondary | ICD-10-CM | POA: Diagnosis not present

## 2023-07-15 DIAGNOSIS — Z885 Allergy status to narcotic agent status: Secondary | ICD-10-CM

## 2023-07-15 LAB — CBC WITH DIFFERENTIAL/PLATELET
Abs Immature Granulocytes: 0.01 10*3/uL (ref 0.00–0.07)
Basophils Absolute: 0 10*3/uL (ref 0.0–0.1)
Basophils Relative: 1 %
Eosinophils Absolute: 0.3 10*3/uL (ref 0.0–0.5)
Eosinophils Relative: 6 %
HCT: 29.1 % — ABNORMAL LOW (ref 36.0–46.0)
Hemoglobin: 10.5 g/dL — ABNORMAL LOW (ref 12.0–15.0)
Immature Granulocytes: 0 %
Lymphocytes Relative: 47 %
Lymphs Abs: 2.5 10*3/uL (ref 0.7–4.0)
MCH: 33.8 pg (ref 26.0–34.0)
MCHC: 36.1 g/dL — ABNORMAL HIGH (ref 30.0–36.0)
MCV: 93.6 fL (ref 80.0–100.0)
Monocytes Absolute: 0.5 10*3/uL (ref 0.1–1.0)
Monocytes Relative: 9 %
Neutro Abs: 1.9 10*3/uL (ref 1.7–7.7)
Neutrophils Relative %: 37 %
Platelets: 271 10*3/uL (ref 150–400)
RBC: 3.11 MIL/uL — ABNORMAL LOW (ref 3.87–5.11)
RDW: 13.5 % (ref 11.5–15.5)
WBC: 5.1 10*3/uL (ref 4.0–10.5)
nRBC: 0 % (ref 0.0–0.2)

## 2023-07-15 LAB — CBC WITH DIFFERENTIAL (CANCER CENTER ONLY)
Abs Immature Granulocytes: 0.01 10*3/uL (ref 0.00–0.07)
Basophils Absolute: 0 10*3/uL (ref 0.0–0.1)
Basophils Relative: 0 %
Eosinophils Absolute: 0.3 10*3/uL (ref 0.0–0.5)
Eosinophils Relative: 6 %
HCT: 27.8 % — ABNORMAL LOW (ref 36.0–46.0)
Hemoglobin: 10.1 g/dL — ABNORMAL LOW (ref 12.0–15.0)
Immature Granulocytes: 0 %
Lymphocytes Relative: 49 %
Lymphs Abs: 2.5 10*3/uL (ref 0.7–4.0)
MCH: 33.3 pg (ref 26.0–34.0)
MCHC: 36.3 g/dL — ABNORMAL HIGH (ref 30.0–36.0)
MCV: 91.7 fL (ref 80.0–100.0)
Monocytes Absolute: 0.4 10*3/uL (ref 0.1–1.0)
Monocytes Relative: 8 %
Neutro Abs: 1.9 10*3/uL (ref 1.7–7.7)
Neutrophils Relative %: 37 %
Platelet Count: 278 10*3/uL (ref 150–400)
RBC: 3.03 MIL/uL — ABNORMAL LOW (ref 3.87–5.11)
RDW: 13.4 % (ref 11.5–15.5)
WBC Count: 5.2 10*3/uL (ref 4.0–10.5)
nRBC: 0 % (ref 0.0–0.2)

## 2023-07-15 LAB — CMP (CANCER CENTER ONLY)
ALT: 24 U/L (ref 0–44)
AST: 31 U/L (ref 15–41)
Albumin: 3.5 g/dL (ref 3.5–5.0)
Alkaline Phosphatase: 85 U/L (ref 38–126)
Anion gap: 6 (ref 5–15)
BUN: 9 mg/dL (ref 8–23)
CO2: 25 mmol/L (ref 22–32)
Calcium: 9.2 mg/dL (ref 8.9–10.3)
Chloride: 88 mmol/L — ABNORMAL LOW (ref 98–111)
Creatinine: 1.61 mg/dL — ABNORMAL HIGH (ref 0.44–1.00)
GFR, Estimated: 33 mL/min — ABNORMAL LOW (ref >60)
Glucose, Bld: 94 mg/dL (ref 70–99)
Potassium: 4.7 mmol/L (ref 3.5–5.1)
Sodium: 119 mmol/L — CL (ref 135–145)
Total Bilirubin: 0.4 mg/dL (ref 0.0–1.2)
Total Protein: 6.2 g/dL — ABNORMAL LOW (ref 6.5–8.1)

## 2023-07-15 LAB — BASIC METABOLIC PANEL
Anion gap: 10 (ref 5–15)
BUN: 10 mg/dL (ref 8–23)
CO2: 22 mmol/L (ref 22–32)
Calcium: 9.2 mg/dL (ref 8.9–10.3)
Chloride: 88 mmol/L — ABNORMAL LOW (ref 98–111)
Creatinine, Ser: 1.35 mg/dL — ABNORMAL HIGH (ref 0.44–1.00)
GFR, Estimated: 41 mL/min — ABNORMAL LOW (ref >60)
Glucose, Bld: 94 mg/dL (ref 70–99)
Potassium: 4.7 mmol/L (ref 3.5–5.1)
Sodium: 120 mmol/L — ABNORMAL LOW (ref 135–145)

## 2023-07-15 LAB — OSMOLALITY: Osmolality: 255 mosm/kg — ABNORMAL LOW (ref 275–295)

## 2023-07-15 LAB — PHOSPHORUS: Phosphorus: 4.9 mg/dL — ABNORMAL HIGH (ref 2.5–4.6)

## 2023-07-15 MED ORDER — ONDANSETRON HCL 4 MG PO TABS: 4.0000 mg | ORAL_TABLET | Freq: Four times a day (QID) | ORAL | Status: DC | PRN

## 2023-07-15 MED ORDER — NICOTINE POLACRILEX 2 MG MT GUM: 4.0000 mg | CHEWING_GUM | OROMUCOSAL | Status: DC | PRN

## 2023-07-15 MED ORDER — ALBUTEROL SULFATE (2.5 MG/3ML) 0.083% IN NEBU: 2.5000 mg | INHALATION_SOLUTION | RESPIRATORY_TRACT | Status: DC | PRN

## 2023-07-15 MED ORDER — ERDAFITINIB 4 MG PO TABS: 8.0000 mg | ORAL_TABLET | Freq: Every day | ORAL | Status: DC

## 2023-07-15 MED ORDER — SODIUM CHLORIDE 0.9% FLUSH
10.0000 mL | Freq: Two times a day (BID) | INTRAVENOUS | Status: DC
  Administered 2023-07-16 – 2023-07-20 (×5): 10 mL

## 2023-07-15 MED ORDER — TRAMADOL HCL 50 MG PO TABS
50.0000 mg | ORAL_TABLET | Freq: Four times a day (QID) | ORAL | Status: DC | PRN
  Administered 2023-07-19: 50 mg via ORAL
  Filled 2023-07-15 (×2): qty 1

## 2023-07-15 MED ORDER — ENOXAPARIN SODIUM 40 MG/0.4ML IJ SOSY
40.0000 mg | PREFILLED_SYRINGE | INTRAMUSCULAR | Status: DC
  Administered 2023-07-15 – 2023-07-19 (×5): 40 mg via SUBCUTANEOUS
  Filled 2023-07-15 (×5): qty 0.4

## 2023-07-15 MED ORDER — ONDANSETRON HCL 4 MG/2ML IJ SOLN: 4.0000 mg | Freq: Four times a day (QID) | INTRAMUSCULAR | Status: DC | PRN

## 2023-07-15 MED ORDER — CHLORHEXIDINE GLUCONATE CLOTH 2 % EX PADS
6.0000 | MEDICATED_PAD | Freq: Every day | CUTANEOUS | Status: DC
  Administered 2023-07-16 – 2023-07-19 (×4): 6 via TOPICAL

## 2023-07-15 MED ORDER — TEMAZEPAM 15 MG PO CAPS
15.0000 mg | ORAL_CAPSULE | Freq: Every evening | ORAL | Status: DC | PRN
  Administered 2023-07-15 – 2023-07-19 (×5): 15 mg via ORAL
  Filled 2023-07-15 (×5): qty 1

## 2023-07-15 MED ORDER — SODIUM CHLORIDE 0.9 % IV BOLUS
1000.0000 mL | Freq: Once | INTRAVENOUS | Status: AC
  Administered 2023-07-15: 1000 mL via INTRAVENOUS

## 2023-07-15 MED ORDER — ACETAMINOPHEN 650 MG RE SUPP: 650.0000 mg | Freq: Four times a day (QID) | RECTAL | Status: DC | PRN

## 2023-07-15 MED ORDER — ASPIRIN 81 MG PO TBEC
81.0000 mg | DELAYED_RELEASE_TABLET | Freq: Every day | ORAL | Status: DC
  Administered 2023-07-15 – 2023-07-20 (×6): 81 mg via ORAL
  Filled 2023-07-15 (×6): qty 1

## 2023-07-15 MED ORDER — CILOSTAZOL 100 MG PO TABS
50.0000 mg | ORAL_TABLET | Freq: Two times a day (BID) | ORAL | Status: DC
  Administered 2023-07-15 – 2023-07-20 (×10): 50 mg via ORAL
  Filled 2023-07-15: qty 1
  Filled 2023-07-15: qty 0.5
  Filled 2023-07-15 (×9): qty 1

## 2023-07-15 MED ORDER — METOPROLOL TARTRATE 25 MG PO TABS
25.0000 mg | ORAL_TABLET | Freq: Two times a day (BID) | ORAL | Status: DC
  Filled 2023-07-15: qty 1

## 2023-07-15 MED ORDER — ACETAMINOPHEN 325 MG PO TABS: 650.0000 mg | ORAL_TABLET | Freq: Four times a day (QID) | ORAL | Status: DC | PRN

## 2023-07-15 MED ORDER — AMLODIPINE BESYLATE 10 MG PO TABS
5.0000 mg | ORAL_TABLET | Freq: Every day | ORAL | Status: DC
  Administered 2023-07-15: 5 mg via ORAL
  Filled 2023-07-15 (×2): qty 1

## 2023-07-15 MED ORDER — SODIUM CHLORIDE 0.9 % IV SOLN: INTRAVENOUS | Status: AC

## 2023-07-15 MED ORDER — SODIUM CHLORIDE 0.9% FLUSH
10.0000 mL | INTRAVENOUS | Status: DC | PRN
  Administered 2023-07-16 – 2023-07-20 (×2): 10 mL

## 2023-07-15 MED ORDER — LEVOTHYROXINE SODIUM 50 MCG PO TABS
50.0000 ug | ORAL_TABLET | Freq: Every day | ORAL | Status: DC
  Administered 2023-07-16 – 2023-07-17 (×2): 50 ug via ORAL
  Filled 2023-07-15 (×2): qty 1

## 2023-07-15 MED ORDER — HEPARIN SOD (PORK) LOCK FLUSH 100 UNIT/ML IV SOLN
500.0000 [IU] | Freq: Once | INTRAVENOUS | Status: AC
  Administered 2023-07-15: 500 [IU]

## 2023-07-15 MED ORDER — OXYBUTYNIN CHLORIDE 5 MG PO TABS
5.0000 mg | ORAL_TABLET | Freq: Three times a day (TID) | ORAL | Status: DC | PRN
  Filled 2023-07-15: qty 1

## 2023-07-15 MED ORDER — SODIUM CHLORIDE 0.9% FLUSH
10.0000 mL | Freq: Once | INTRAVENOUS | Status: AC
  Administered 2023-07-15: 10 mL

## 2023-07-15 NOTE — Progress Notes (Signed)
Patient being sent to [facility] ED per provider. Contacted ED CN with pt info. [name] & O x 3 and verbalized understanding of transfer to ED. Patient's port remains accessed. Transported Ms. Morgan Torres to ED in patient transport chair with pt friend Bard Herbert walking along side. Patient taken to [facility] ED RM 4.Patient demographic info and most recent Vital Signs given to ED RN.

## 2023-07-15 NOTE — ED Provider Notes (Signed)
[place] [facility] AT [facility] Provider Note   CSN: [id number] Arrival date & time: 07/15/23  1551    History  Chief Complaint  Patient presents with   Weakness    Morgan Torres is a 73 y.o. female metastatic bladder cancer, cardiomyopathy, anemia, hypertension here for evaluation of abnormal lab.  She was seen last week by the cancer center noted to have low sodium.  Started on salt tablets.  She is still taking these.  She was seen again by oncology today with persistent fatigue, weakness and lightheadedness.  Noted to have worsening sodium of 119.  States she had influenza 2 weeks ago.  She still has a cough.  Taking Mucinex. No HA, pain, SOB, abd pain, no bloody stool, emesis.  Has had some intermittent nausea, loose stool, 3 episodes today. Overall decreased appetite since FLU dx 2 weeks ago.   HPI     Home Medications Prior to Admission medications   Medication Sig Start Date End Date Taking? Authorizing Provider  acetaminophen (TYLENOL) 500 MG tablet Take 1 tablet (500 mg total) by mouth every 6 (six) hours as needed for mild pain. 10/14/22  Yes Rising, Lurena Joiner, PA-C  albuterol (PROVENTIL HFA;VENTOLIN HFA) 108 (90 Base) MCG/ACT inhaler Inhale 2 puffs into the lungs every 6 (six) hours as needed for wheezing or shortness of breath. 05/10/16  Yes Amyot, Ali Lowe, NP  amLODipine (NORVASC) 5 MG tablet Take 1 tablet (5 mg total) by mouth daily. <PLEASE MAKE APPOINTMENT FOR REFILLS> 04/24/23  Yes Little Ishikawa, MD  aspirin EC 81 MG tablet Take 81 mg by mouth daily. Swallow whole.   Yes [provider]  cilostazol (PLETAL) 50 MG tablet Take 50 mg by mouth 2 (two) times daily.   Yes [provider]  EPINEPHrine (EPIPEN 2-PAK) 0.3 mg/0.3 mL DEVI Inject 0.3 mLs (0.3 mg total) into the muscle once as needed (for severe allergic reaction). CAll 911 immediately if you have to use this medicine 11/26/12  Yes Piepenbrink, Victorino Dike, PA-C   erdafitinib (BALVERSA) 4 MG tablet Take 2 tablets (8 mg total) by mouth daily. 02/06/23  Yes Si Gaul, MD  levothyroxine (SYNTHROID) 50 MCG tablet TAKE 1 TABLET BY MOUTH DAILY BEFORE BREAKFAST 03/19/23  Yes Si Gaul, MD  levothyroxine (SYNTHROID) 88 MCG tablet TAKE 1 TABLET BY MOUTH DAILY BEFORE BREAKFAST. 02/08/23  Yes Heilingoetter, Cassandra L, PA-C  loperamide (IMODIUM) 1 MG/5ML solution Take 1 mg by mouth as needed for diarrhea or loose stools.   Yes [provider]  LORazepam (ATIVAN) 0.5 MG tablet Take 0.5 mg by mouth daily. 07/12/23  Yes [provider]  metoprolol tartrate (LOPRESSOR) 25 MG tablet TAKE 1 TABLET BY MOUTH TWICE A DAY 04/04/23  Yes Hilty, Lisette Abu, MD  nicotine (NICODERM CQ - DOSED IN MG/24 HOURS) 21 mg/24hr patch Place 21 mg onto the skin daily.   Yes [provider]  nicotine polacrilex (NICORETTE) 4 MG gum Take 4 mg by mouth as needed for smoking cessation.   Yes [provider]  nitroGLYCERIN (NITROSTAT) 0.4 MG SL tablet Place 1 tablet (0.4 mg total) under the tongue every 5 (five) minutes x 3 doses as needed for chest pain. 09/25/18  Yes Hilty, Lisette Abu, MD  oxybutynin (DITROPAN) 5 MG tablet Take 1 tablet (5 mg total) by mouth every 8 (eight) hours as needed for bladder spasms. 08/23/21  Yes Rene Paci, MD  polyethylene glycol (MIRALAX / GLYCOLAX) 17 g packet Take 17  g by mouth as needed.   Yes [provider]  sodium chloride 1 g tablet Take 1 tablet (1 g total) by mouth 2 (two) times daily with a meal. 07/10/23  Yes Heilingoetter, Cassandra L, PA-C  temazepam (RESTORIL) 15 MG capsule Take 1 capsule (15 mg total) by mouth at bedtime as needed for sleep. 02/06/23  Yes Heilingoetter, Cassandra L, PA-C  traMADol (ULTRAM) 50 MG tablet Take 50 mg by mouth every 6 (six) hours as needed for moderate pain (pain score 4-6). Not taking   Yes [provider]  atorvastatin (LIPITOR) 20 MG tablet Take 20 mg  by mouth. Monday wed and Friday in am Patient not taking: Reported on 07/15/2023 11/25/20   [provider]  calcium acetate (PHOSLO) 667 MG tablet Take 1 tablet (667 mg total) by mouth 3 (three) times daily with meals. Patient not taking: Reported on 07/15/2023 04/30/23   Heilingoetter, Cassandra L, PA-C  HYDROcodone-acetaminophen (NORCO/VICODIN) 5-325 MG tablet Take 1 tablet by mouth 3 (three) times daily as needed. Patient not taking: Reported on 07/15/2023 11/12/22   [provider]      Allergies    Diclofenac, Ciprofloxacin, Codeine, and Hydrocodone    Review of Systems   Review of Systems  Constitutional:  Positive for activity change and appetite change. Negative for chills, diaphoresis, fatigue, fever and unexpected weight change.  HENT: Negative.    Respiratory:  Positive for cough. Negative for apnea, choking, chest tightness, shortness of breath, wheezing and stridor.   Cardiovascular: Negative.   Gastrointestinal:  Positive for diarrhea and nausea. Negative for abdominal distention, abdominal pain, anal bleeding, blood in stool, constipation, rectal pain and vomiting.  Genitourinary: Negative.   Musculoskeletal: Negative.   Skin: Negative.   Neurological:  Positive for weakness. Negative for dizziness, tremors, seizures, syncope, facial asymmetry, speech difficulty, light-headedness, numbness and headaches.  All other systems reviewed and are negative.   Physical Exam Updated Vital Signs BP (!) 129/59   Pulse (!) 56   Temp 98 F (36.7 C)   Resp 18   SpO2 99%  Physical Exam Vitals and nursing note reviewed.  Constitutional:      General: She is not in acute distress.    Appearance: She is well-developed. She is not ill-appearing, toxic-appearing or diaphoretic.  HENT:     Head: Normocephalic and atraumatic.     Nose: Nose normal.     Mouth/Throat:     Mouth: Mucous membranes are dry.  Eyes:     Pupils: Pupils are equal, round, and reactive to light.   Cardiovascular:     Rate and Rhythm: Normal rate.     Pulses: Normal pulses.     Heart sounds: Normal heart sounds.  Pulmonary:     Effort: Pulmonary effort is normal. No respiratory distress.     Breath sounds: Normal breath sounds.  Chest:     Comments: Port RU chest wall Abdominal:     General: Bowel sounds are normal. There is no distension.     Palpations: Abdomen is soft.     Tenderness: There is no abdominal tenderness.  Musculoskeletal:        General: Normal range of motion.     Cervical back: Normal range of motion.     Comments: Full rom  Skin:    General: Skin is warm and dry.  Neurological:     General: No focal deficit present.     Mental Status: She is alert.  Psychiatric:  Mood and Affect: Mood normal.     ED Results / Procedures / Treatments   Labs (all labs ordered are listed, but only abnormal results are displayed) Labs Reviewed  CBC WITH DIFFERENTIAL/PLATELET - Abnormal; Notable for the following components:      Result Value   RBC 3.11 (*)    Hemoglobin 10.5 (*)    HCT 29.1 (*)    MCHC 36.1 (*)    All other components within normal limits  BASIC METABOLIC PANEL - Abnormal; Notable for the following components:   Sodium 120 (*)    Chloride 88 (*)    Creatinine, Ser 1.35 (*)    GFR, Estimated 41 (*)    All other components within normal limits  OSMOLALITY  OSMOLALITY, URINE  SODIUM, URINE, RANDOM  URINALYSIS, W/ REFLEX TO CULTURE (INFECTION SUSPECTED)  CBC  BASIC METABOLIC PANEL  BASIC METABOLIC PANEL    EKG None  Radiology DG Chest Portable 1 View Result Date: 07/15/2023 CLINICAL DATA:  Cough.  Pulmonary nodules, bladder cancer. EXAM: PORTABLE CHEST 1 VIEW COMPARISON:  Chest CT 07/09/2023 FINDINGS: Chondro double right Port-A-Cath tip: SVC. Atherosclerotic calcification of the aortic arch. Heart size within normal limits. The pulmonary nodule seen at chest CT are less apparent on conventional radiography. Airway thickening is  present, suggesting bronchitis or reactive airways disease. Confluent interstitial accentuation medially at the right lung base could represent atelectasis or bronchopneumonia. Degenerative glenohumeral arthropathy noted bilaterally. Thoracic spondylosis. IMPRESSION: 1. Airway thickening is present, suggesting bronchitis or reactive airways disease. 2. Confluent interstitial accentuation medially at the right lung base could represent atelectasis or bronchopneumonia. 3. The pulmonary nodules seen at chest CT are less apparent on conventional radiography. 4. Thoracic spondylosis. 5. Degenerative glenohumeral arthropathy bilaterally. Electronically Signed   By: Gaylyn Rong M.D.   On: 07/15/2023 17:33    Procedures .Critical Care  Performed by: Linwood Dibbles, PA-C Authorized by: Linwood Dibbles, PA-C   Critical care provider statement:    Critical care time (minutes):  35   Critical care was necessary to treat or prevent imminent or life-threatening deterioration of the following conditions:  Dehydration and metabolic crisis   Critical care was time spent personally by me on the following activities:  Development of treatment plan with patient or surrogate, discussions with consultants, evaluation of patient's response to treatment, examination of patient, ordering and review of laboratory studies, ordering and review of radiographic studies, ordering and performing treatments and interventions, pulse oximetry, re-evaluation of patient's condition and review of old charts     Medications Ordered in ED Medications  aspirin EC tablet 81 mg (has no administration in time range)  traMADol (ULTRAM) tablet 50 mg (has no administration in time range)  erdafitinib (BALVERSA) tablet 8 mg (has no administration in time range)  amLODipine (NORVASC) tablet 5 mg (has no administration in time range)  metoprolol tartrate (LOPRESSOR) tablet 25 mg (has no administration in time range)  nicotine  polacrilex (NICORETTE) gum 4 mg (has no administration in time range)  temazepam (RESTORIL) capsule 15 mg (has no administration in time range)  levothyroxine (SYNTHROID) tablet 50 mcg (has no administration in time range)  oxybutynin (DITROPAN) tablet 5 mg (has no administration in time range)  cilostazol (PLETAL) tablet 50 mg (has no administration in time range)  enoxaparin (LOVENOX) injection 40 mg (has no administration in time range)  0.9 %  sodium chloride infusion (has no administration in time range)  acetaminophen (TYLENOL) tablet 650 mg (has no administration  in time range)    Or  acetaminophen (TYLENOL) suppository 650 mg (has no administration in time range)  ondansetron (ZOFRAN) tablet 4 mg (has no administration in time range)    Or  ondansetron (ZOFRAN) injection 4 mg (has no administration in time range)  albuterol (PROVENTIL) (2.5 MG/3ML) 0.083% nebulizer solution 2.5 mg (has no administration in time range)  sodium chloride 0.9 % bolus 1,000 mL (1,000 mLs Intravenous New Bag/Given 07/15/23 1731)    ED Course/ Medical Decision Making/ A&P   73 year old multiple medical problems here for evaluation of abnormal lab.  Note of hyponatremia last week at oncology.  Started on salt tablets.  Recheck today continue to have downtrending sodium.  Has history of similar.  Of note she said decreased p.o. intake since having the flu last week.  No seizure-like activity, altered mental status.  She still has a cough. No CP, SOB, hemoptysis. 3 episodes of loose stool today without blood. Sent in for admission.  Labs and imaging personally viewed and interpreted  Labs reviewed at cancer center show sodium 119 CBC without leukocytosis, hemoglobin 10.5 Metabolic panel sodium 120, creatinine 1.35, similar to prior  Patient given fluid bolus of sodium chloride for significant hyponatremia.  Will hold on additional sodium containing products.  I suspect her hyponatremia is likely hypovolemic  given her decreased p.o. intake since having the flu.  Will discuss with medicine team for admission.  Consult with Dr. Kirby Crigler with medicine who agrees to evaluate patient for admission  The patient appears reasonably stabilized for admission considering the current resources, flow, and capabilities available in the ED at this time, and I doubt any other [facility] requiring further screening and/or treatment in the ED prior to admission.                                  Medical Decision Making Amount and/or Complexity of Data Reviewed Independent Historian: friend External Data Reviewed: labs, radiology, ECG and notes. Labs: ordered. Decision-making details documented in ED Course. Radiology: ordered and independent interpretation performed. Decision-making details documented in ED Course.  Risk OTC drugs. Prescription drug management. Decision regarding hospitalization. Diagnosis or treatment significantly limited by social determinants of health.          Final Clinical Impression(s) / ED Diagnoses Final diagnoses:  Weakness  Hyponatremia  Malignant neoplasm of urinary bladder, unspecified site [facility])    Rx / DC Orders ED Discharge Orders     None         Lorel Lembo A, PA-C 07/15/23 1817    Wynetta Fines, MD 07/15/23 2240

## 2023-07-15 NOTE — H&P (Addendum)
History and Physical  Morgan Torres ZOX:[id number] DOB: 1950-10-31 DOA: 07/15/2023  PCP: [name], MD   Chief Complaint: Weakness, low sodium  HPI: Morgan Torres is a 73 y.o. female with medical history significant for CAD, hypertension, depression, COPD on room air, CKD stage III, metastatic bladder cancer being admitted to the hospital with weakness and hyponatremia.  States that she had the flu a couple weeks ago, felt very weak.  She has unfortunately had progressive weakness, denies any fevers, chills, nausea, vomiting, diarrhea, joint or bodyaches.  Lab work done at the oncology clinic 1/14 showed sodium of 121, she was prescribed salt tablets and had follow-up labs today which showed sodium of 119.  She denies any headache, confusion, vision changes.  She was given normal saline in the emergency department, and hospitalist was contacted for admission.  Review of Systems: Please see HPI for pertinent positives and negatives. A complete 10 system review of systems are otherwise negative.  Past Medical History:  Diagnosis Date   Anxiety    Arthritis    hip, lumbar spine    Asthma    seasonal allergies    Bronchitis    Hx: of   Chronic kidney disease    COPD (chronic obstructive pulmonary disease) (HCC)    Coronary artery disease    Depression    Fibromyalgia    Hypertension    Lumbar herniated disc    Myocardial infarction (HCC) 06/25/2012   followed by Dr. Rennis Golden, treated medically, no stents   Peripheral arterial disease (HCC)    of right foot   Pneumonia    Hx: of yrs ago   Sciatica    Sleep apnea    Small bowel obstruction (HCC)    several yrs ago   Past Surgical History:  Procedure Laterality Date   ABDOMINAL SURGERY     resection of small intestine   ANTERIOR CRUCIATE LIGAMENT REPAIR Bilateral    BACK SURGERY     fusion of lumbar   CARDIAC CATHETERIZATION  06/25/2012   COLON SURGERY     resection for bowel - for obstruction  6 to 7 yrs ago per pt on  01-05-2022   COLONOSCOPY     Hx; of   DILATION AND CURETTAGE OF UTERUS     EYE SURGERY Bilateral    cataracts removed   HERNIA REPAIR  02/23/1969   inguinal hernia    IR IMAGING GUIDED PORT INSERTION  06/07/2022   LEFT HEART CATHETERIZATION WITH CORONARY ANGIOGRAM N/A 03/30/2013   Procedure: LEFT HEART CATHETERIZATION WITH CORONARY ANGIOGRAM;  Surgeon: Marykay Lex, MD;  Location: [facility] CATH LAB;  Service: Cardiovascular;  Laterality: N/A;   left thumb surgery     yrs ago   ROBOT ASSITED LAPAROSCOPIC NEPHROURETERECTOMY Left 03/09/2021   Procedure: XI ROBOT ASSITED LAPAROSCOPIC NEPHROURETERECTOMY/ CYSTOSCOPY WITH LEFT URETEROSCOPY WITH TRANSURETHRAL RESECTION OF URETERAL ORIFICE;  Surgeon: Rene Paci, MD;  Location: [facility] ORS;  Service: Urology;  Laterality: Left;   SALIVARY GLAND SURGERY Left    approached from inside mouth & side of neck 1970-1980   TONSILLECTOMY     as child   TOTAL HIP ARTHROPLASTY Left 05/28/2014   Procedure: LEFT TOTAL HIP ARTHROPLASTY;  Surgeon: Thera Flake., MD;  Location: [facility] OR;  Service: Orthopedics;  Laterality: Left;   TRANSURETHRAL RESECTION OF BLADDER TUMOR N/A 08/23/2021   Procedure: TRANSURETHRAL RESECTION OF BLADDER TUMOR (TURBT) WITH CYSTOSCOPY/ POSTOPERATIVE INSTILLATION OF GEMCITABINE/retrograde pyelogram and stent placement (right);  Surgeon: Liliane Shi,  Dorian Furnace, MD;  Location: [facility];  Service: Urology;  Laterality: N/A;   TRANSURETHRAL RESECTION OF BLADDER TUMOR N/A 01/10/2022   Procedure: TRANSURETHRAL RESECTION OF BLADDER TUMOR (TURBT) WITH CYSTOSCOPY / GEMCITABINE INSTILLATION post-operatively;  Surgeon: Rene Paci, MD;  Location: [facility];  Service: Urology;  Laterality: N/A;  ONLY NEEDS 30 MIN   Social History:  reports that she has been smoking cigarettes. She has a 12.5 pack-year smoking history. She has never used smokeless tobacco. She reports current alcohol use. She  reports that she does not use drugs.  Allergies  Allergen Reactions   Diclofenac Anaphylaxis   Ciprofloxacin Rash   Codeine Itching   Hydrocodone Itching    Family History  Problem Relation Age of Onset   Hypertension Mother    Diabetes Mother    Cancer - Other Mother    Cancer Sister    Breast cancer Daughter      Prior to Admission medications   Medication Sig Start Date End Date Taking? Authorizing Provider  acetaminophen (TYLENOL) 500 MG tablet Take 1 tablet (500 mg total) by mouth every 6 (six) hours as needed for mild pain. 10/14/22  Yes Rising, Lurena Joiner, PA-C  albuterol (PROVENTIL HFA;VENTOLIN HFA) 108 (90 Base) MCG/ACT inhaler Inhale 2 puffs into the lungs every 6 (six) hours as needed for wheezing or shortness of breath. 05/10/16  Yes Amyot, Ali Lowe, NP  amLODipine (NORVASC) 5 MG tablet Take 1 tablet (5 mg total) by mouth daily. <PLEASE MAKE APPOINTMENT FOR REFILLS> 04/24/23  Yes Little Ishikawa, MD  aspirin EC 81 MG tablet Take 81 mg by mouth daily. Swallow whole.   Yes [provider]  cilostazol (PLETAL) 50 MG tablet Take 50 mg by mouth 2 (two) times daily.   Yes [provider]  EPINEPHrine (EPIPEN 2-PAK) 0.3 mg/0.3 mL DEVI Inject 0.3 mLs (0.3 mg total) into the muscle once as needed (for severe allergic reaction). CAll 911 immediately if you have to use this medicine 11/26/12  Yes Piepenbrink, Victorino Dike, PA-C  erdafitinib (BALVERSA) 4 MG tablet Take 2 tablets (8 mg total) by mouth daily. 02/06/23  Yes Si Gaul, MD  levothyroxine (SYNTHROID) 50 MCG tablet TAKE 1 TABLET BY MOUTH DAILY BEFORE BREAKFAST 03/19/23  Yes Si Gaul, MD  levothyroxine (SYNTHROID) 88 MCG tablet TAKE 1 TABLET BY MOUTH DAILY BEFORE BREAKFAST. 02/08/23  Yes Heilingoetter, Cassandra L, PA-C  loperamide (IMODIUM) 1 MG/5ML solution Take 1 mg by mouth as needed for diarrhea or loose stools.   Yes [provider]  LORazepam (ATIVAN) 0.5 MG tablet Take 0.5 mg by  mouth daily. 07/12/23  Yes [provider]  metoprolol tartrate (LOPRESSOR) 25 MG tablet TAKE 1 TABLET BY MOUTH TWICE A DAY 04/04/23  Yes Hilty, Lisette Abu, MD  nicotine (NICODERM CQ - DOSED IN MG/24 HOURS) 21 mg/24hr patch Place 21 mg onto the skin daily.   Yes [provider]  nicotine polacrilex (NICORETTE) 4 MG gum Take 4 mg by mouth as needed for smoking cessation.   Yes [provider]  nitroGLYCERIN (NITROSTAT) 0.4 MG SL tablet Place 1 tablet (0.4 mg total) under the tongue every 5 (five) minutes x 3 doses as needed for chest pain. 09/25/18  Yes Hilty, Lisette Abu, MD  oxybutynin (DITROPAN) 5 MG tablet Take 1 tablet (5 mg total) by mouth every 8 (eight) hours as needed for bladder spasms. 08/23/21  Yes Rene Paci, MD  polyethylene glycol (MIRALAX / GLYCOLAX) 17 g  packet Take 17 g by mouth as needed.   Yes [provider]  sodium chloride 1 g tablet Take 1 tablet (1 g total) by mouth 2 (two) times daily with a meal. 07/10/23  Yes Heilingoetter, Cassandra L, PA-C  temazepam (RESTORIL) 15 MG capsule Take 1 capsule (15 mg total) by mouth at bedtime as needed for sleep. 02/06/23  Yes Heilingoetter, Cassandra L, PA-C  traMADol (ULTRAM) 50 MG tablet Take 50 mg by mouth every 6 (six) hours as needed for moderate pain (pain score 4-6). Not taking   Yes [provider]  atorvastatin (LIPITOR) 20 MG tablet Take 20 mg by mouth. Monday wed and Friday in am Patient not taking: Reported on 07/15/2023 11/25/20   [provider]  calcium acetate (PHOSLO) 667 MG tablet Take 1 tablet (667 mg total) by mouth 3 (three) times daily with meals. Patient not taking: Reported on 07/15/2023 04/30/23   Heilingoetter, Cassandra L, PA-C  HYDROcodone-acetaminophen (NORCO/VICODIN) 5-325 MG tablet Take 1 tablet by mouth 3 (three) times daily as needed. Patient not taking: Reported on 07/15/2023 11/12/22   [provider]    Physical Exam: BP (!) 126/95   Pulse  60   Temp 98 F (36.7 C)   Resp 20   SpO2 95%  General:  Alert, oriented, calm, in no acute distress, overall looks dry Cardiovascular: RRR, no murmurs or rubs, no peripheral edema  Respiratory: clear to auscultation bilaterally, no wheezes, no crackles  Abdomen: soft, nontender, nondistended, normal bowel tones heard  Skin: dry, no rashes, right anterior chest port site looks benign Musculoskeletal: no joint effusions, normal range of motion  Psychiatric: appropriate affect, normal speech  Neurologic: extraocular muscles intact, clear speech, moving all extremities with intact sensorium         Labs on Admission:  Basic Metabolic Panel: Recent Labs  Lab 07/09/23 1434 07/15/23 1412 07/15/23 1612  NA 121* 119* 120*  K 4.7 4.7 4.7  CL 88* 88* 88*  CO2 25 25 22   GLUCOSE 97 94 94  BUN 8 9 10   CREATININE 1.41* 1.61* 1.35*  CALCIUM 9.6 9.2 9.2  PHOS 4.4 4.9*  --    Liver Function Tests: Recent Labs  Lab 07/09/23 1434 07/15/23 1412  AST 28 31  ALT 19 24  ALKPHOS 84 85  BILITOT 0.3 0.4  PROT 6.4* 6.2*  ALBUMIN 3.5 3.5   No results for input(s): "LIPASE", "AMYLASE" in the last 168 hours. No results for input(s): "AMMONIA" in the last 168 hours. CBC: Recent Labs  Lab 07/09/23 1434 07/15/23 1412 07/15/23 1612  WBC 5.6 5.2 5.1  NEUTROABS 2.4 1.9 1.9  HGB 10.7* 10.1* 10.5*  HCT 30.2* 27.8* 29.1*  MCV 93.2 91.7 93.6  PLT 393 278 271   Cardiac Enzymes: No results for input(s): "CKTOTAL", "CKMB", "CKMBINDEX", "TROPONINI" in the last 168 hours. BNP (last 3 results) No results for input(s): "BNP" in the last 8760 hours.  ProBNP (last 3 results) No results for input(s): "PROBNP" in the last 8760 hours.  CBG: No results for input(s): "GLUCAP" in the last 168 hours.  Radiological Exams on Admission: DG Chest Portable 1 View Result Date: 07/15/2023 CLINICAL DATA:  Cough.  Pulmonary nodules, bladder cancer. EXAM: PORTABLE CHEST 1 VIEW COMPARISON:  Chest CT 07/09/2023  FINDINGS: Chondro double right Port-A-Cath tip: SVC. Atherosclerotic calcification of the aortic arch. Heart size within normal limits. The pulmonary nodule seen at chest CT are less apparent on conventional radiography. Airway thickening is present, suggesting  bronchitis or reactive airways disease. Confluent interstitial accentuation medially at the right lung base could represent atelectasis or bronchopneumonia. Degenerative glenohumeral arthropathy noted bilaterally. Thoracic spondylosis. IMPRESSION: 1. Airway thickening is present, suggesting bronchitis or reactive airways disease. 2. Confluent interstitial accentuation medially at the right lung base could represent atelectasis or bronchopneumonia. 3. The pulmonary nodules seen at chest CT are less apparent on conventional radiography. 4. Thoracic spondylosis. 5. Degenerative glenohumeral arthropathy bilaterally. Electronically Signed   By: Gaylyn Rong M.D.   On: 07/15/2023 17:33   Assessment/Plan Morgan Torres is a 73 y.o. female with medical history significant for CAD, hypertension, depression, COPD on room air, CKD stage III, metastatic bladder cancer being admitted to the hospital with weakness and hyponatremia.  Severe hyponatremia-I suspect this is due to hypovolemic hyponatremia due to poor appetite the last few weeks.  Causing weakness, but otherwise no concerning symptoms. -Observation admission -Monitor sodium level every 6 hours -Check TSH  Weakness-initially was due to influenza, however she no longer has bodyaches, fever/chills, cough or other symptoms.  Suspect weakness is due to hyponatremia, I expect it to improve along with her sodium.  CKD stage III-appears to be at baseline  CAD-continue aspirin and Pletal as well as metoprolol  Metastatic bladder cancer-under the care of Dr. Arbutus Ped who was added to treatment team.  Will hold [facility] for now as needs to be ordered by Oncology, and can be a source of hyponatremia in  about 2% of cases. Discussed with Dr. Pamelia Hoit who recommends holding for now.  Hypertension-amlodipine  Hypothyroidism-continue Synthroid  Chronic anemia-at baseline  DVT prophylaxis: Lovenox     Code Status: Full Code  Consults called: None  Admission status: Observation  Time spent: 49 minutes  Talor Desrosiers Sharlette Dense MD [facility] Pager [phone]  If 7PM-7AM, please contact night-coverage [email or web address] Password [facility]  07/15/2023, 5:48 PM

## 2023-07-15 NOTE — ED Triage Notes (Signed)
Pt sent by cancer center for hyponatremia, NA 119.  Pt complains of fatigue, weakness, and lightheaded.  Being treated for bladder cancer.  C/o flu 2 weeks ago.

## 2023-07-15 NOTE — ED Notes (Signed)
Attempted to call report

## 2023-07-16 ENCOUNTER — Other Ambulatory Visit: Payer: Self-pay

## 2023-07-16 DIAGNOSIS — Z833 Family history of diabetes mellitus: Secondary | ICD-10-CM | POA: Diagnosis not present

## 2023-07-16 DIAGNOSIS — G4700 Insomnia, unspecified: Secondary | ICD-10-CM | POA: Diagnosis present

## 2023-07-16 DIAGNOSIS — D649 Anemia, unspecified: Secondary | ICD-10-CM | POA: Diagnosis not present

## 2023-07-16 DIAGNOSIS — F32A Depression, unspecified: Secondary | ICD-10-CM | POA: Diagnosis present

## 2023-07-16 DIAGNOSIS — J180 Bronchopneumonia, unspecified organism: Secondary | ICD-10-CM | POA: Diagnosis present

## 2023-07-16 DIAGNOSIS — Z7982 Long term (current) use of aspirin: Secondary | ICD-10-CM | POA: Diagnosis not present

## 2023-07-16 DIAGNOSIS — E871 Hypo-osmolality and hyponatremia: Secondary | ICD-10-CM | POA: Diagnosis not present

## 2023-07-16 DIAGNOSIS — C679 Malignant neoplasm of bladder, unspecified: Secondary | ICD-10-CM | POA: Diagnosis not present

## 2023-07-16 DIAGNOSIS — I2510 Atherosclerotic heart disease of native coronary artery without angina pectoris: Secondary | ICD-10-CM | POA: Diagnosis present

## 2023-07-16 DIAGNOSIS — I129 Hypertensive chronic kidney disease with stage 1 through stage 4 chronic kidney disease, or unspecified chronic kidney disease: Secondary | ICD-10-CM | POA: Diagnosis not present

## 2023-07-16 DIAGNOSIS — Z7902 Long term (current) use of antithrombotics/antiplatelets: Secondary | ICD-10-CM | POA: Diagnosis not present

## 2023-07-16 DIAGNOSIS — E039 Hypothyroidism, unspecified: Secondary | ICD-10-CM | POA: Diagnosis not present

## 2023-07-16 DIAGNOSIS — I429 Cardiomyopathy, unspecified: Secondary | ICD-10-CM | POA: Diagnosis not present

## 2023-07-16 DIAGNOSIS — Z8249 Family history of ischemic heart disease and other diseases of the circulatory system: Secondary | ICD-10-CM | POA: Diagnosis not present

## 2023-07-16 DIAGNOSIS — M797 Fibromyalgia: Secondary | ICD-10-CM | POA: Diagnosis present

## 2023-07-16 DIAGNOSIS — J440 Chronic obstructive pulmonary disease with acute lower respiratory infection: Secondary | ICD-10-CM | POA: Diagnosis not present

## 2023-07-16 DIAGNOSIS — C771 Secondary and unspecified malignant neoplasm of intrathoracic lymph nodes: Secondary | ICD-10-CM | POA: Diagnosis not present

## 2023-07-16 DIAGNOSIS — Z96642 Presence of left artificial hip joint: Secondary | ICD-10-CM | POA: Diagnosis present

## 2023-07-16 DIAGNOSIS — I252 Old myocardial infarction: Secondary | ICD-10-CM | POA: Diagnosis not present

## 2023-07-16 DIAGNOSIS — E861 Hypovolemia: Secondary | ICD-10-CM | POA: Diagnosis present

## 2023-07-16 DIAGNOSIS — I739 Peripheral vascular disease, unspecified: Secondary | ICD-10-CM | POA: Diagnosis not present

## 2023-07-16 DIAGNOSIS — E222 Syndrome of inappropriate secretion of antidiuretic hormone: Secondary | ICD-10-CM | POA: Diagnosis not present

## 2023-07-16 DIAGNOSIS — N1832 Chronic kidney disease, stage 3b: Secondary | ICD-10-CM | POA: Diagnosis not present

## 2023-07-16 DIAGNOSIS — F17210 Nicotine dependence, cigarettes, uncomplicated: Secondary | ICD-10-CM | POA: Diagnosis present

## 2023-07-16 DIAGNOSIS — Z79890 Hormone replacement therapy (postmenopausal): Secondary | ICD-10-CM | POA: Diagnosis not present

## 2023-07-16 LAB — BASIC METABOLIC PANEL
Anion gap: 8 (ref 5–15)
Anion gap: 4 — ABNORMAL LOW (ref 5–15)
BUN: 10 mg/dL (ref 8–23)
BUN: 9 mg/dL (ref 8–23)
CO2: 22 mmol/L (ref 22–32)
CO2: 22 mmol/L (ref 22–32)
Calcium: 8.6 mg/dL — ABNORMAL LOW (ref 8.9–10.3)
Calcium: 8 mg/dL — ABNORMAL LOW (ref 8.9–10.3)
Chloride: 93 mmol/L — ABNORMAL LOW (ref 98–111)
Chloride: 95 mmol/L — ABNORMAL LOW (ref 98–111)
Creatinine, Ser: 1.38 mg/dL — ABNORMAL HIGH (ref 0.44–1.00)
Creatinine, Ser: 1.38 mg/dL — ABNORMAL HIGH (ref 0.44–1.00)
GFR, Estimated: 40 mL/min — ABNORMAL LOW (ref >60)
GFR, Estimated: 40 mL/min — ABNORMAL LOW (ref >60)
Glucose, Bld: 83 mg/dL (ref 70–99)
Glucose, Bld: 81 mg/dL (ref 70–99)
Potassium: 4.1 mmol/L (ref 3.5–5.1)
Potassium: 4.1 mmol/L (ref 3.5–5.1)
Sodium: 123 mmol/L — ABNORMAL LOW (ref 135–145)
Sodium: 121 mmol/L — ABNORMAL LOW (ref 135–145)

## 2023-07-16 LAB — CBC
HCT: 26.4 % — ABNORMAL LOW (ref 36.0–46.0)
Hemoglobin: 9.2 g/dL — ABNORMAL LOW (ref 12.0–15.0)
MCH: 33.3 pg (ref 26.0–34.0)
MCHC: 34.8 g/dL (ref 30.0–36.0)
MCV: 95.7 fL (ref 80.0–100.0)
Platelets: 239 10*3/uL (ref 150–400)
RBC: 2.76 MIL/uL — ABNORMAL LOW (ref 3.87–5.11)
RDW: 13.6 % (ref 11.5–15.5)
WBC: 4.7 10*3/uL (ref 4.0–10.5)
nRBC: 0 % (ref 0.0–0.2)

## 2023-07-16 LAB — URINALYSIS, W/ REFLEX TO CULTURE (INFECTION SUSPECTED)
Bacteria, UA: NONE SEEN
Bilirubin Urine: NEGATIVE
Glucose, UA: NEGATIVE mg/dL
Hgb urine dipstick: NEGATIVE
Ketones, ur: NEGATIVE mg/dL
Leukocytes,Ua: NEGATIVE
Nitrite: NEGATIVE
Protein, ur: NEGATIVE mg/dL
Specific Gravity, Urine: 1.005 (ref 1.005–1.030)
pH: 5 (ref 5.0–8.0)

## 2023-07-16 LAB — SODIUM: Sodium: 122 mmol/L — ABNORMAL LOW (ref 135–145)

## 2023-07-16 LAB — SODIUM, URINE, RANDOM: Sodium, Ur: 47 mmol/L

## 2023-07-16 LAB — OSMOLALITY, URINE: Osmolality, Ur: 209 mosm/kg — ABNORMAL LOW (ref 300–900)

## 2023-07-16 MED ORDER — METOPROLOL SUCCINATE ER 25 MG PO TB24
25.0000 mg | ORAL_TABLET | Freq: Every day | ORAL | Status: DC
  Administered 2023-07-18: 25 mg via ORAL
  Filled 2023-07-16 (×4): qty 1

## 2023-07-16 MED ORDER — LIDOCAINE HCL URETHRAL/MUCOSAL 2 % EX GEL
1.0000 | Freq: Once | CUTANEOUS | Status: AC
  Administered 2023-07-16: 1 via URETHRAL
  Filled 2023-07-16: qty 5

## 2023-07-16 NOTE — Progress Notes (Signed)
TRH ROUNDING   NOTE MARKETIA TENBROECK ZOX:[id number]  DOB: 1951/01/09  DOA: 07/15/2023  PCP: [name], MD  07/16/2023,7:21 AM   LOS: 0 days      Code Status: Full From: Home  current Dispo: Likely home   73 year old.fem- Met static urothelial CA stage II post left nephro ureterectomy 03/09/2021 Dr. Domingo Pulse with Dr. Mohammed-completed carboplatin gemcitabine Neulasta Avelumab and started [facility] 02/22/2023 HTN, NSTEMI in 2014 cath = mild to moderate CAD no obvious culprit lesion possible Takotsubo cardiomyopathy previously Prior left-sided hip repair 2015, previous spinal stenosis with neurogenic claudication status post L5-S1 decompression fusion with some chronic pain  had the flu several weeks previously went to oncology office around 1/14 found to have a sodium 121 was given salt tablets and told to follow-up- Follow-up visit 1/20 sodium still low sent to emergency room  Sodium 120 potassium 4.7 BUN/creatinine 10/1.3 WBC 5.1 Urine osmolality 255 serum osmolality 250 urine sodium not obtained  Admitted for management  procedures 1/20 CXR 1 view airway thickening?  Bronchitis restrictive disease confluent accentuation immediately?  Atelectasis bronchopneumonia-pulmonary nodules noted not new  Plan  Likely hypovolemic hyponatremia-not SIADH as urine Osmo is also low Urine sodium pending but urine osmolality and serum osmolality are low raising concern for hypovolemic hyponatremia She has been drinking more fluids has not been on thiazide and is not eating much so this may be tea toast potomania/polydipsia Will stop IV fluid will restrict fluids to 1200 cc and will challenge with salt tablets and see effect Because I do not think this is SIADH we should also check TSH and a.m. cortisol which has been ordered for tomorrow Repeat urine studies pending nursing aware--- repeat sodium this p.m. at 1700-stop every 6 checks for right now If does not improve over the next 24 hours would consult  nephrology Recent flu No overt cough cold fever at this time and just recently recovered from flu No indication for further antibiotics Mild bradycardia and hypotension Mild hypotension indicative of hypovolemic state as above-discontinued amlodipine change Toprol-XL to 25 this afternoon Metastatic stage II urothelial CA--right-sided Port-A-Cath Admitting physician discussed with oncology-apparently Balversa can cause hyponatremia so we have held Outpatient follow-up with Dr. Shirline Frees Depression/insomnia Continue temazepam 15 at bedtime as needed Hypothyroid Continue levothyroxine 50 mcg daily NSTEMI 2014 nonobstructive CAD prior Takotsubo Can continue aspirin 81 mg, metoprolol as above  DVT prophylaxis: SCD  Status is: Observation The patient will require care spanning > 2 midnights and should be moved to inpatient because:   Requires replacement of electrolytes and improvement  Subjective: Awake coherent no distress looks well feels fair no chest pain no fever no chills Feels better than she did yesterday less dizzy less woozy but was lightheaded yesterday Has not been up-to-date Drinking more than eating  Objective + exam Vitals:   07/15/23 2007 07/15/23 2300 07/16/23 0255 07/16/23 0606  BP:  118/74 (!) 106/59 104/61  Pulse: (!) 53 (!) 58 (!) 55 (!) 58  Resp:  18 18 14   Temp:  97.7 F (36.5 C) 97.8 F (36.6 C) 97.6 F (36.4 C)  TempSrc:  Oral Oral Oral  SpO2:  93% 98% 96%  Weight:      Height:       Filed Weights   07/15/23 1835  Weight: 78.5 kg    Examination: Awake pleasant no distress no icterus no pallor Chest is clear no wheeze rales rhonchi ROM is intact Power is 5/5 no focal deficit Abdomen is soft no rebound no  guarding Has right-sided Port-A-Cath No lower extremity edema  Data Reviewed: reviewed   CBC    Component Value Date/Time   WBC 4.7 07/16/2023 0500   RBC 2.76 (L) 07/16/2023 0500   HGB 9.2 (L) 07/16/2023 0500   HGB 10.1 (L)  07/15/2023 1412   HCT 26.4 (L) 07/16/2023 0500   PLT 239 07/16/2023 0500   PLT 278 07/15/2023 1412   MCV 95.7 07/16/2023 0500   MCH 33.3 07/16/2023 0500   MCHC 34.8 07/16/2023 0500   RDW 13.6 07/16/2023 0500   LYMPHSABS 2.5 07/15/2023 1612   MONOABS 0.5 07/15/2023 1612   EOSABS 0.3 07/15/2023 1612   BASOSABS 0.0 07/15/2023 1612      Latest Ref Rng & Units 07/16/2023    4:50 AM 07/15/2023   11:48 PM 07/15/2023    4:12 PM  CMP  Glucose 70 - 99 mg/dL 81  83  94   BUN 8 - 23 mg/dL 9  10  10    Creatinine 0.44 - 1.00 mg/dL 1.61  0.96  0.45   Sodium 135 - 145 mmol/L 121  123  120   Potassium 3.5 - 5.1 mmol/L 4.1  4.1  4.7   Chloride 98 - 111 mmol/L 95  93  88   CO2 22 - 32 mmol/L 22  22  22    Calcium 8.9 - 10.3 mg/dL 8.0  8.6  9.2     Scheduled Meds:  amLODipine  5 mg Oral Daily   aspirin EC  81 mg Oral Daily   Chlorhexidine Gluconate Cloth  6 each Topical Daily   cilostazol  50 mg Oral BID   enoxaparin (LOVENOX) injection  40 mg Subcutaneous Q24H   levothyroxine  50 mcg Oral Q0600   metoprolol tartrate  25 mg Oral BID   sodium chloride flush  10-40 mL Intracatheter Q12H   Continuous Infusions:  sodium chloride 100 mL/hr at 07/15/23 2127    Time  44  Rhetta Mura, MD  [facility]

## 2023-07-16 NOTE — Plan of Care (Signed)

## 2023-07-16 NOTE — Progress Notes (Signed)
   07/16/23 1409  TOC Brief Assessment  Insurance and Status Reviewed  Patient has primary care physician Yes  Home environment has been reviewed Resides with spouse in single family home  Prior level of function: Independent with ADLs at baseline  Prior/Current Home Services No current home services  Social Drivers of Health Review SDOH reviewed no interventions necessary  Readmission risk has been reviewed Yes  Transition of care needs no transition of care needs at this time

## 2023-07-16 NOTE — Telephone Encounter (Signed)
Oral Oncology Patient Advocate Encounter  Called again to check status. Representative stated all documents needed had been received and that patient is due for next refill on 07/26/23. Representative advised they should have the process completed before this next refill. I will continue to follow until final determination is made.  Jinger Neighbors, CPhT-Adv Oncology Pharmacy Patient Advocate [facility] Direct Number: [phone]  Fax: [phone]

## 2023-07-17 ENCOUNTER — Ambulatory Visit: Payer: Medicare Other | Admitting: Internal Medicine

## 2023-07-17 ENCOUNTER — Other Ambulatory Visit: Payer: Self-pay | Admitting: Physician Assistant

## 2023-07-17 DIAGNOSIS — E039 Hypothyroidism, unspecified: Secondary | ICD-10-CM

## 2023-07-17 DIAGNOSIS — E871 Hypo-osmolality and hyponatremia: Secondary | ICD-10-CM | POA: Diagnosis not present

## 2023-07-17 LAB — CORTISOL-AM, BLOOD: Cortisol - AM: 9.7 ug/dL (ref 6.7–22.6)

## 2023-07-17 LAB — TSH
TSH: 121.642 u[IU]/mL — ABNORMAL HIGH (ref 0.350–4.500)
TSH: 129.825 u[IU]/mL — ABNORMAL HIGH (ref 0.350–4.500)

## 2023-07-17 LAB — SODIUM
Sodium: 122 mmol/L — ABNORMAL LOW (ref 135–145)
Sodium: 124 mmol/L — ABNORMAL LOW (ref 135–145)
Sodium: 125 mmol/L — ABNORMAL LOW (ref 135–145)
Sodium: 127 mmol/L — ABNORMAL LOW (ref 135–145)

## 2023-07-17 LAB — T4, FREE: Free T4: 0.37 ng/dL — ABNORMAL LOW (ref 0.61–1.12)

## 2023-07-17 MED ORDER — DM-GUAIFENESIN ER 30-600 MG PO TB12
1.0000 | ORAL_TABLET | Freq: Two times a day (BID) | ORAL | Status: DC
  Administered 2023-07-17 – 2023-07-18 (×2): 1 via ORAL
  Filled 2023-07-17 (×2): qty 1

## 2023-07-17 MED ORDER — LEVOTHYROXINE SODIUM 50 MCG PO TABS
75.0000 ug | ORAL_TABLET | Freq: Every day | ORAL | Status: DC
  Administered 2023-07-18 – 2023-07-19 (×2): 75 ug via ORAL
  Filled 2023-07-17 (×2): qty 1

## 2023-07-17 MED ORDER — SODIUM CHLORIDE 1 G PO TABS
1.0000 g | ORAL_TABLET | Freq: Three times a day (TID) | ORAL | Status: DC
  Administered 2023-07-17 – 2023-07-18 (×5): 1 g via ORAL
  Filled 2023-07-17 (×5): qty 1

## 2023-07-17 NOTE — Telephone Encounter (Signed)
Oral Oncology Patient Advocate Encounter   Received notification re-enrollment for assistance for Balversa through [employer] has been approved. Patient may continue to receive their medication at $0 from this program.    [employer] phone number [phone].   Effective dates: 07/16/23 through 06/24/24  I have spoken to the patient.  Jinger Neighbors, CPhT-Adv Oncology Pharmacy Patient Advocate [facility] Direct Number: [phone]  Fax: [phone]

## 2023-07-17 NOTE — TOC Progression Note (Signed)
Transition of Care [facility]) - Progression Note   Patient Details  Name: Morgan Torres MRN: [id number] Date of Birth: 01/09/1951  [facility] CM/SW Contact  Ewing Schlein, LCSW Phone Number: 07/17/2023, 10:00 AM  Clinical Narrative: Readmission screening completed.  Barriers to Discharge: Continued Medical Work up  Social Determinants of Health (SDOH) Interventions SDOH Screenings   Food Insecurity: No Food Insecurity (07/15/2023)  Housing: Low Risk  (07/15/2023)  Transportation Needs: No Transportation Needs (07/15/2023)  Utilities: Not At Risk (07/15/2023)  Social Connections: Unknown (07/15/2023)  Tobacco Use: High Risk (07/15/2023)   Readmission Risk Interventions    07/17/2023   10:00 AM  Readmission Risk Prevention Plan  Transportation Screening Complete  HRI or Home Care Consult Complete  Social Work Consult for Recovery Care Planning/Counseling Complete  Palliative Care Screening Not Applicable  Medication Review Ocean[employer]) Complete

## 2023-07-17 NOTE — Plan of Care (Signed)

## 2023-07-17 NOTE — Progress Notes (Signed)
PROGRESS NOTE    Morgan Torres  [id number]:[id number] DOB: 02-Sep-1950 DOA: 07/15/2023 PCP: [name], MD   Brief Narrative: 73 year old with past medical history significant for metastatic urothelial cancer stage II post left nephroureterectomy 03/09/2021 by Dr. Alvester Morin, follow-up with Dr. Shirline Frees completed carboplatin, hypertension, non-STEMI 2014, previously spinal stenosis with neurogenic claudication status post L5-S1 decompression fusion, chronic pain, presents with hyponatremia, dizziness, weakness.  She was found to have low sodium at 121 on 1/14.  Was started on sodium tablet by her oncology.  Continue to have abnormal labs was referred for seizure management to the ED.  Presented with sodium of 119, chest x-ray showed airway thickening bronchitis atelectasis, bronchopneumonia.     Assessment & Plan:   Principal Problem:   Hyponatremia   1-Hyponatremia: Urine osmolarity 209 Suspect related to SIADH in the setting of uncontrolled hypothyroidism, Continue with water restriction, will start sodium tablets Them increased this morning to 124 Sodium level every 4 hours  2-Hypothyroidism uncontrolled TSH: 129.825 Increase Synthroid to 75 mcg.  She relates that she had not been taking as she showed Check free T3 free T4  Mild bradycardia and hypotension: Amlodipine discontinue  and metoprolol dose reduced  Metastatic stage II urothelial CA Physician discussed with oncology apparently Balversa could cause hyponatremia this has been on hold Depression: Continue with temazepam     Estimated body mass index is 26.31 kg/m as calculated from the following:   Height as of this encounter: 5\' 8"  (1.727 m).   Weight as of this encounter: 78.5 kg.   DVT prophylaxis: Lovenox Code Status: Full Code Family Communication: Care discussed with patient Disposition Plan:  Status is: Inpatient Remains inpatient appropriate because: management of hyponatremia    Consultants:   non  Procedures:  none  Antimicrobials:    Subjective: She reports feeling a little bit better, she feels less dizziness, reports poor oral intake because she does not like hospital fluid  Objective: Vitals:   07/16/23 2011 07/17/23 0000 07/17/23 0503 07/17/23 0737  BP: 114/62  109/65 (!) 100/55  Pulse: (!) 55  61 75  Resp:  14 16 16   Temp: 97.7 F (36.5 C)  97.7 F (36.5 C) 98.1 F (36.7 C)  TempSrc: Oral  Oral Oral  SpO2: 99%  97% 94%  Weight:      Height:        Intake/Output Summary (Last 24 hours) at 07/17/2023 0803 Last data filed at 07/16/2023 1355 Gross per 24 hour  Intake 470 ml  Output --  Net 470 ml   Filed Weights   07/15/23 1835  Weight: 78.5 kg    Examination:  General exam: Appears calm and comfortable  Respiratory system: Clear to auscultation. Respiratory effort normal. Cardiovascular system: S1 & S2 heard, RRR. No JVD, murmurs, rubs, gallops or clicks. No pedal edema. Gastrointestinal system: Abdomen is nondistended, soft and nontender. No organomegaly or masses felt. Normal bowel sounds heard. Central nervous system: Alert and oriented.  Extremities: Symmetric 5 x 5 power.     Data Reviewed: I have personally reviewed following labs and imaging studies  CBC: Recent Labs  Lab 07/15/23 1412 07/15/23 1612 07/16/23 0500  WBC 5.2 5.1 4.7  NEUTROABS 1.9 1.9  --   HGB 10.1* 10.5* 9.2*  HCT 27.8* 29.1* 26.4*  MCV 91.7 93.6 95.7  PLT 278 271 239   Basic Metabolic Panel: Recent Labs  Lab 07/15/23 1412 07/15/23 1612 07/15/23 2348 07/16/23 0450 07/16/23 1350 07/17/23 0424  NA 119* 120* 123*  121* 122* 122*  K 4.7 4.7 4.1 4.1  --   --   CL 88* 88* 93* 95*  --   --   CO2 25 22 22 22   --   --   GLUCOSE 94 94 83 81  --   --   BUN 9 10 10 9   --   --   CREATININE 1.61* 1.35* 1.38* 1.38*  --   --   CALCIUM 9.2 9.2 8.6* 8.0*  --   --   PHOS 4.9*  --   --   --   --   --    GFR: Estimated Creatinine Clearance: 39.4 mL/min (A) (by C-G  formula based on SCr of 1.38 mg/dL (H)). Liver Function Tests: Recent Labs  Lab 07/15/23 1412  AST 31  ALT 24  ALKPHOS 85  BILITOT 0.4  PROT 6.2*  ALBUMIN 3.5   No results for input(s): "LIPASE", "AMYLASE" in the last 168 hours. No results for input(s): "AMMONIA" in the last 168 hours. Coagulation Profile: No results for input(s): "INR", "PROTIME" in the last 168 hours. Cardiac Enzymes: No results for input(s): "CKTOTAL", "CKMB", "CKMBINDEX", "TROPONINI" in the last 168 hours. BNP (last 3 results) No results for input(s): "PROBNP" in the last 8760 hours. HbA1C: No results for input(s): "HGBA1C" in the last 72 hours. CBG: No results for input(s): "GLUCAP" in the last 168 hours. Lipid Profile: No results for input(s): "CHOL", "HDL", "LDLCALC", "TRIG", "CHOLHDL", "LDLDIRECT" in the last 72 hours. Thyroid Function Tests: Recent Labs    07/17/23 0424  TSH 121.642*   Anemia Panel: No results for input(s): "VITAMINB12", "FOLATE", "FERRITIN", "TIBC", "IRON", "RETICCTPCT" in the last 72 hours. Sepsis Labs: No results for input(s): "PROCALCITON", "LATICACIDVEN" in the last 168 hours.  No results found for this or any previous visit (from the past 240 hours).       Radiology Studies: DG Chest Portable 1 View Result Date: 07/15/2023 CLINICAL DATA:  Cough.  Pulmonary nodules, bladder cancer. EXAM: PORTABLE CHEST 1 VIEW COMPARISON:  Chest CT 07/09/2023 FINDINGS: Chondro double right Port-A-Cath tip: SVC. Atherosclerotic calcification of the aortic arch. Heart size within normal limits. The pulmonary nodule seen at chest CT are less apparent on conventional radiography. Airway thickening is present, suggesting bronchitis or reactive airways disease. Confluent interstitial accentuation medially at the right lung base could represent atelectasis or bronchopneumonia. Degenerative glenohumeral arthropathy noted bilaterally. Thoracic spondylosis. IMPRESSION: 1. Airway thickening is present,  suggesting bronchitis or reactive airways disease. 2. Confluent interstitial accentuation medially at the right lung base could represent atelectasis or bronchopneumonia. 3. The pulmonary nodules seen at chest CT are less apparent on conventional radiography. 4. Thoracic spondylosis. 5. Degenerative glenohumeral arthropathy bilaterally. Electronically Signed   By: Gaylyn Rong M.D.   On: 07/15/2023 17:33        Scheduled Meds:  aspirin EC  81 mg Oral Daily   Chlorhexidine Gluconate Cloth  6 each Topical Daily   cilostazol  50 mg Oral BID   enoxaparin (LOVENOX) injection  40 mg Subcutaneous Q24H   levothyroxine  50 mcg Oral Q0600   metoprolol succinate  25 mg Oral Daily   sodium chloride flush  10-40 mL Intracatheter Q12H   Continuous Infusions:   LOS: 1 day    Time spent: 35 minutes    Heba Ige A Antoneo Ghrist, MD [facility]   If 7PM-7AM, please contact night-coverage [email or web address]  07/17/2023, 8:03 AM

## 2023-07-18 DIAGNOSIS — E871 Hypo-osmolality and hyponatremia: Secondary | ICD-10-CM | POA: Diagnosis not present

## 2023-07-18 LAB — CBC
HCT: 27.6 % — ABNORMAL LOW (ref 36.0–46.0)
Hemoglobin: 9.5 g/dL — ABNORMAL LOW (ref 12.0–15.0)
MCH: 33.7 pg (ref 26.0–34.0)
MCHC: 34.4 g/dL (ref 30.0–36.0)
MCV: 97.9 fL (ref 80.0–100.0)
Platelets: 239 10*3/uL (ref 150–400)
RBC: 2.82 MIL/uL — ABNORMAL LOW (ref 3.87–5.11)
RDW: 13.9 % (ref 11.5–15.5)
WBC: 5.9 10*3/uL (ref 4.0–10.5)
nRBC: 0 % (ref 0.0–0.2)

## 2023-07-18 LAB — BASIC METABOLIC PANEL
Anion gap: 8 (ref 5–15)
BUN: 9 mg/dL (ref 8–23)
CO2: 21 mmol/L — ABNORMAL LOW (ref 22–32)
Calcium: 8.9 mg/dL (ref 8.9–10.3)
Chloride: 98 mmol/L (ref 98–111)
Creatinine, Ser: 1.47 mg/dL — ABNORMAL HIGH (ref 0.44–1.00)
GFR, Estimated: 37 mL/min — ABNORMAL LOW (ref >60)
Glucose, Bld: 86 mg/dL (ref 70–99)
Potassium: 4.6 mmol/L (ref 3.5–5.1)
Sodium: 127 mmol/L — ABNORMAL LOW (ref 135–145)

## 2023-07-18 LAB — T3, FREE: T3, Free: <0.3 pg/mL — ABNORMAL LOW (ref 2.0–4.4)

## 2023-07-18 LAB — SODIUM: Sodium: 127 mmol/L — ABNORMAL LOW (ref 135–145)

## 2023-07-18 MED ORDER — SODIUM CHLORIDE 0.9 % IV SOLN
2.0000 g | INTRAVENOUS | Status: DC
  Administered 2023-07-18 – 2023-07-20 (×3): 2 g via INTRAVENOUS
  Filled 2023-07-18 (×3): qty 20

## 2023-07-18 MED ORDER — AZITHROMYCIN 250 MG PO TABS
500.0000 mg | ORAL_TABLET | Freq: Every day | ORAL | Status: DC
  Administered 2023-07-18 – 2023-07-20 (×3): 500 mg via ORAL
  Filled 2023-07-18 (×4): qty 2

## 2023-07-18 MED ORDER — GUAIFENESIN ER 600 MG PO TB12
1200.0000 mg | ORAL_TABLET | Freq: Two times a day (BID) | ORAL | Status: DC
  Administered 2023-07-18 – 2023-07-20 (×4): 1200 mg via ORAL
  Filled 2023-07-18 (×4): qty 2

## 2023-07-18 NOTE — Plan of Care (Signed)

## 2023-07-18 NOTE — Progress Notes (Addendum)
PROGRESS NOTE    Morgan Torres  XBJ:[id number] DOB: 1951/04/11 DOA: 07/15/2023 PCP: [name], MD   Brief Narrative: 73 year old with past medical history significant for metastatic urothelial cancer stage II post left nephroureterectomy 03/09/2021 by Dr. Alvester Morin, follow-up with Dr. Shirline Frees,  hypertension, non-STEMI 2014, previously spinal stenosis with neurogenic claudication status post L5-S1 decompression fusion, chronic pain, presents with hyponatremia, dizziness, weakness.  She was found to have low sodium at 121 on 1/14.  Was started on sodium tablet by her oncology.  Continue to have abnormal labs was referred for further management to the ED.  Presented with sodium of 119, chest x-ray showed airway thickening bronchitis atelectasis, bronchopneumonia.     Assessment & Plan:   Principal Problem:   Hyponatremia   1-Hyponatremia: Urine osmolarity 209 Suspect related to SIADH in the setting of uncontrolled hypothyroidism, Continue with water restriction, started  sodium tablets on 1/22. Sodium increasing this morning at 127. Labs daily.    2-Hypothyroidism uncontrolled TSH: 129.825 Increase Synthroid to 75 mcg.  She relates that she had not been taking as she showed free T3: 0.3  free T4 0.37  Mild bradycardia and hypotension: Amlodipine discontinue  and metoprolol dose reduced  Metastatic stage II urothelial CA Physician discussed with oncology apparently Balversa could cause hyponatremia this has been on hold  Bronchitis, PNA:  Chest x rary finding of PNA. She report cough, she is immunocompromise.  -Started on  IV ceftriaxone and Azithromycin.  -Started Mucinex MD. PRN albuterol.   Depression: Continue with temazepam  CKD Stage IIIb Monitor.     Estimated body mass index is 26.31 kg/m as calculated from the following:   Height as of this encounter: 5\' 8"  (1.727 m).   Weight as of this encounter: 78.5 kg.   DVT prophylaxis: Lovenox Code Status: Full  Code Family Communication: Care discussed with patient Disposition Plan:  Status is: Inpatient Remains inpatient appropriate because: management of hyponatremia    Consultants:  non  Procedures:  none  Antimicrobials:    Subjective: Feels less dizzy. Cough improving.   Objective: Vitals:   07/17/23 0737 07/17/23 1020 07/17/23 1947 07/18/23 0358  BP: (!) 100/55 (!) 100/49 (!) 103/58 101/61  Pulse: 75 62 67 67  Resp: 16 18 15 20   Temp: 98.1 F (36.7 C)  97.7 F (36.5 C) 97.8 F (36.6 C)  TempSrc: Oral  Oral Oral  SpO2: 94% 99% 92% 98%  Weight:      Height:        Intake/Output Summary (Last 24 hours) at 07/18/2023 0728 Last data filed at 07/18/2023 3086 Gross per 24 hour  Intake 1190 ml  Output 450 ml  Net 740 ml   Filed Weights   07/15/23 1835  Weight: 78.5 kg    Examination:  General exam: NAD Respiratory system: CTA Cardiovascular system: S 1, S 2 RRR Gastrointestinal system: BS present, soft, nt Extremities: no edema.     Data Reviewed: I have personally reviewed following labs and imaging studies  CBC: Recent Labs  Lab 07/15/23 1412 07/15/23 1612 07/16/23 0500 07/18/23 0327  WBC 5.2 5.1 4.7 5.9  NEUTROABS 1.9 1.9  --   --   HGB 10.1* 10.5* 9.2* 9.5*  HCT 27.8* 29.1* 26.4* 27.6*  MCV 91.7 93.6 95.7 97.9  PLT 278 271 239 239   Basic Metabolic Panel: Recent Labs  Lab 07/15/23 1412 07/15/23 1612 07/15/23 2348 07/16/23 0450 07/16/23 1350 07/17/23 0935 07/17/23 1558 07/17/23 2020 07/17/23 2345 07/18/23 0327  NA  119* 120* 123* 121*   < > 124* 125* 127* 127* 127*  K 4.7 4.7 4.1 4.1  --   --   --   --   --  4.6  CL 88* 88* 93* 95*  --   --   --   --   --  98  CO2 25 22 22 22   --   --   --   --   --  21*  GLUCOSE 94 94 83 81  --   --   --   --   --  86  BUN 9 10 10 9   --   --   --   --   --  9  CREATININE 1.61* 1.35* 1.38* 1.38*  --   --   --   --   --  1.47*  CALCIUM 9.2 9.2 8.6* 8.0*  --   --   --   --   --  8.9  PHOS 4.9*  --    --   --   --   --   --   --   --   --    < > = values in this interval not displayed.   GFR: Estimated Creatinine Clearance: 36.9 mL/min (A) (by C-G formula based on SCr of 1.47 mg/dL (H)). Liver Function Tests: Recent Labs  Lab 07/15/23 1412  AST 31  ALT 24  ALKPHOS 85  BILITOT 0.4  PROT 6.2*  ALBUMIN 3.5   No results for input(s): "LIPASE", "AMYLASE" in the last 168 hours. No results for input(s): "AMMONIA" in the last 168 hours. Coagulation Profile: No results for input(s): "INR", "PROTIME" in the last 168 hours. Cardiac Enzymes: No results for input(s): "CKTOTAL", "CKMB", "CKMBINDEX", "TROPONINI" in the last 168 hours. BNP (last 3 results) No results for input(s): "PROBNP" in the last 8760 hours. HbA1C: No results for input(s): "HGBA1C" in the last 72 hours. CBG: No results for input(s): "GLUCAP" in the last 168 hours. Lipid Profile: No results for input(s): "CHOL", "HDL", "LDLCALC", "TRIG", "CHOLHDL", "LDLDIRECT" in the last 72 hours. Thyroid Function Tests: Recent Labs    07/17/23 0935  TSH 129.825*  FREET4 0.37*   Anemia Panel: No results for input(s): "VITAMINB12", "FOLATE", "FERRITIN", "TIBC", "IRON", "RETICCTPCT" in the last 72 hours. Sepsis Labs: No results for input(s): "PROCALCITON", "LATICACIDVEN" in the last 168 hours.  No results found for this or any previous visit (from the past 240 hours).       Radiology Studies: No results found.       Scheduled Meds:  aspirin EC  81 mg Oral Daily   Chlorhexidine Gluconate Cloth  6 each Topical Daily   cilostazol  50 mg Oral BID   dextromethorphan-guaiFENesin  1 tablet Oral BID   enoxaparin (LOVENOX) injection  40 mg Subcutaneous Q24H   levothyroxine  75 mcg Oral Q0600   metoprolol succinate  25 mg Oral Daily   sodium chloride flush  10-40 mL Intracatheter Q12H   sodium chloride  1 g Oral TID WC   Continuous Infusions:   LOS: 2 days    Time spent: 35 minutes    Lacreshia Bondarenko A Ardon Franklin,  MD [facility]   If 7PM-7AM, please contact night-coverage [email or web address]  07/18/2023, 7:28 AM

## 2023-07-19 DIAGNOSIS — E871 Hypo-osmolality and hyponatremia: Secondary | ICD-10-CM | POA: Diagnosis not present

## 2023-07-19 LAB — BASIC METABOLIC PANEL
Anion gap: 9 (ref 5–15)
BUN: 9 mg/dL (ref 8–23)
CO2: 21 mmol/L — ABNORMAL LOW (ref 22–32)
Calcium: 8.5 mg/dL — ABNORMAL LOW (ref 8.9–10.3)
Chloride: 95 mmol/L — ABNORMAL LOW (ref 98–111)
Creatinine, Ser: 1.34 mg/dL — ABNORMAL HIGH (ref 0.44–1.00)
GFR, Estimated: 42 mL/min — ABNORMAL LOW (ref >60)
Glucose, Bld: 82 mg/dL (ref 70–99)
Potassium: 4.6 mmol/L (ref 3.5–5.1)
Sodium: 125 mmol/L — ABNORMAL LOW (ref 135–145)

## 2023-07-19 LAB — SODIUM: Sodium: 129 mmol/L — ABNORMAL LOW (ref 135–145)

## 2023-07-19 MED ORDER — SODIUM CHLORIDE 1 G PO TABS
2.0000 g | ORAL_TABLET | Freq: Three times a day (TID) | ORAL | Status: DC
  Administered 2023-07-19 – 2023-07-20 (×5): 2 g via ORAL
  Filled 2023-07-19 (×5): qty 2

## 2023-07-19 MED ORDER — CLOTRIMAZOLE 1 % EX SOLN
Freq: Two times a day (BID) | CUTANEOUS | Status: DC
  Filled 2023-07-19: qty 10

## 2023-07-19 MED ORDER — LEVOTHYROXINE SODIUM 88 MCG PO TABS
88.0000 ug | ORAL_TABLET | Freq: Every day | ORAL | Status: DC
Start: 2023-07-20 — End: 2023-07-20
  Administered 2023-07-20: 88 ug via ORAL
  Filled 2023-07-19: qty 1

## 2023-07-19 NOTE — Plan of Care (Signed)

## 2023-07-19 NOTE — Plan of Care (Signed)
  Problem: Education: Goal: Knowledge of General Education information will improve Description: Including pain rating scale, medication(s)/side effects and non-pharmacologic comfort measures Outcome: Progressing   Problem: Health Behavior/Discharge Planning: Goal: Ability to manage health-related needs will improve Outcome: Progressing   Problem: Clinical Measurements: Goal: Ability to maintain clinical measurements within normal limits will improve Outcome: Progressing Goal: Will remain free from infection Outcome: Progressing Goal: Diagnostic test results will improve Outcome: Progressing Goal: Respiratory complications will improve Outcome: Progressing Goal: Cardiovascular complication will be avoided Outcome: Progressing   Problem: Activity: Goal: Risk for activity intolerance will decrease Outcome: Progressing   Problem: Nutrition: Goal: Adequate nutrition will be maintained Outcome: Progressing   Problem: Coping: Goal: Level of anxiety will decrease Outcome: Progressing   Problem: Elimination: Goal: Will not experience complications related to bowel motility Outcome: Progressing Goal: Will not experience complications related to urinary retention Outcome: Progressing   Problem: Pain Managment: Goal: General experience of comfort will improve and/or be controlled Outcome: Progressing   Problem: Safety: Goal: Ability to remain free from injury will improve Outcome: Progressing   Problem: Skin Integrity: Goal: Risk for impaired skin integrity will decrease Outcome: Progressing   Problem: Education: Goal: Knowledge of disease and its progression will improve Outcome: Progressing   Problem: Health Behavior/Discharge Planning: Goal: Ability to manage health-related needs will improve Outcome: Progressing   Problem: Clinical Measurements: Goal: Complications related to the disease process or treatment will be avoided or minimized Outcome:  Progressing Goal: Dialysis access will remain free of complications Outcome: Progressing   Problem: Activity: Goal: Activity intolerance will improve Outcome: Progressing   Problem: Fluid Volume: Goal: Fluid volume balance will be maintained or improved Outcome: Progressing   Problem: Nutritional: Goal: Ability to make appropriate dietary choices will improve Outcome: Progressing   Problem: Respiratory: Goal: Respiratory symptoms related to disease process will be avoided Outcome: Progressing   Problem: Self-Concept: Goal: Body image disturbance will be avoided or minimized Outcome: Progressing   Problem: Urinary Elimination: Goal: Progression of disease will be identified and treated Outcome: Progressing

## 2023-07-19 NOTE — Evaluation (Signed)
Physical Therapy Evaluation Patient Details Name: Morgan Torres MRN: [id number] DOB: [birth year] Today's Date: 07/19/2023  History of Present Illness  73 year old presents with hyponatremia, dizziness, weakness. Past medical history significant for metastatic urothelial cancer stage II post left nephroureterectomy 03/09/2021 by Dr. Alvester Morin, follow-up with Dr. Shirline Frees,  hypertension, non-STEMI 2014, previously spinal stenosis with neurogenic claudication status post L5-S1 decompression fusion, chronic pain  Clinical Impression  Pt admitted with above diagnosis.  Pt currently with functional limitations due to the deficits listed below (see PT Problem List). Pt will benefit from acute skilled PT to increase their independence and safety with mobility to allow discharge.  Pt reports she has been mostly only going from bed to bathroom at home for the last 3 weeks.  Pt appears deconditioned.  Pt encouraged to slowly increase activity. Pt feels able to return home with spouse and agreeable to HHPT upon d/c.         If plan is discharge home, recommend the following: A little help with walking and/or transfers;A little help with bathing/dressing/bathroom;Help with stairs or ramp for entrance;Assistance with cooking/housework   Can travel by private vehicle        Equipment Recommendations None recommended by PT  Recommendations for Other Services       Functional Status Assessment Patient has had a recent decline in their functional status and demonstrates the ability to make significant improvements in function in a reasonable and predictable amount of time.     Precautions / Restrictions Precautions Precautions: Fall Restrictions Weight Bearing Restrictions Per Provider Order: No      Mobility  Bed Mobility Overal bed mobility: Modified Independent                  Transfers Overall transfer level: Needs assistance Equipment used: Rolling walker (2 wheels) Transfers: Sit  to/from Stand, Bed to chair/wheelchair/BSC Sit to Stand: Contact guard assist   Step pivot transfers: Contact guard assist       General transfer comment: pt reported light dizziness with sitting EOB, CGA for safety    Ambulation/Gait               General Gait Details: pt declined at this time due to little mobilization for 3 weeks prior to admission but encouraged slowly increasing activity  Stairs            Wheelchair Mobility     Tilt Bed    Modified Rankin (Stroke Patients Only)       Balance Overall balance assessment: No apparent balance deficits (not formally assessed) (denies any recent falls)                                           Pertinent Vitals/Pain Pain Assessment Pain Assessment: No/denies pain    Home Living Family/patient expects to be discharged to:: Private residence Living Arrangements: Spouse/significant other Available Help at Discharge: Family;Available PRN/intermittently Type of Home: House Home Access: Stairs to enter Entrance Stairs-Rails: Right Entrance Stairs-Number of Steps: 2   Home Layout: One level Home Equipment: Agricultural consultant (2 wheels);Cane - single point      Prior Function Prior Level of Function : Independent/Modified Independent             Mobility Comments: reports she has only been ambulating from bed to bathroom for the past 3 weeks, spouse works  Extremity/Trunk Assessment        Lower Extremity Assessment Lower Extremity Assessment: Generalized weakness    Cervical / Trunk Assessment Cervical / Trunk Assessment: Normal  Communication   Communication Communication: No apparent difficulties  Cognition Arousal: Alert Behavior During Therapy: WFL for tasks assessed/performed Overall Cognitive Status: Within Functional Limits for tasks assessed                                          General Comments      Exercises     Assessment/Plan     PT Assessment Patient needs continued PT services  PT Problem List Decreased mobility;Decreased activity tolerance;Decreased strength;Decreased balance;Decreased knowledge of use of DME       PT Treatment Interventions DME instruction;Gait training;Balance training;Patient/family education;Therapeutic exercise;Therapeutic activities;Functional mobility training    PT Goals (Current goals can be found in the Care Plan section)  Acute Rehab PT Goals PT Goal Formulation: With patient Time For Goal Achievement: 08/02/23 Potential to Achieve Goals: Good    Frequency Min 1X/week     Co-evaluation               AM-PAC PT "6 Clicks" Mobility  Outcome Measure Help needed turning from your back to your side while in a flat bed without using bedrails?: A Little Help needed moving from lying on your back to sitting on the side of a flat bed without using bedrails?: A Little Help needed moving to and from a bed to a chair (including a wheelchair)?: A Little Help needed standing up from a chair using your arms (e.g., wheelchair or bedside chair)?: A Little Help needed to walk in hospital room?: A Little Help needed climbing 3-5 steps with a railing? : A Lot 6 Click Score: 17    End of Session   Activity Tolerance: Patient tolerated treatment well Patient left: in chair;with call bell/phone within reach;with chair alarm set Nurse Communication: Mobility status PT Visit Diagnosis: Difficulty in walking, not elsewhere classified (R26.2);Muscle weakness (generalized) (M62.81)    Time: 1027-2536 PT Time Calculation (min) (ACUTE ONLY): 14 min   Charges:   PT Evaluation $PT Eval Low Complexity: 1 Low   PT General Charges $$ ACUTE PT VISIT: 1 Visit       Thomasene Mohair PT, DPT Physical Therapist Acute Rehabilitation Services Office: [phone]   Janan Halter [place] 07/19/2023, 12:24 PM

## 2023-07-19 NOTE — TOC Progression Note (Signed)
Transition of Care [facility]) - Progression Note   Patient Details  Name: Morgan Torres MRN: [id number] Date of Birth: 02-03-1951  Transition of Care [facility]) CM/SW Contact  Ewing Schlein, LCSW Phone Number: 07/19/2023, 3:36 PM  Clinical Narrative: PT/OT consulted and recommended HH. CSW met with patient to discuss recommendations. Patient agreeable to CSW making [facility] referral and does not have an agency preference. CSW made [facility] referral to Amy with [facility], which was accepted. Hospitalist placed HH orders. CSW notified Amy with [facility].  Expected Discharge Plan: Home w Home Health Services Barriers to Discharge: Continued Medical Work up  Expected Discharge Plan and Services In-house Referral: Clinical Social Work Post Acute Care Choice: Home Health Living arrangements for the past 2 months: Single Family Home            DME Arranged: N/A DME Agency: NA HH Arranged: PT, OT HH Agency: [facility] Date HH Agency Contacted: 07/19/23 Time HH Agency Contacted: 1503 Representative spoke with at [facility] Agency: Amy  Social Determinants of Health (SDOH) Interventions SDOH Screenings   Food Insecurity: No Food Insecurity (07/15/2023)  Housing: Low Risk  (07/15/2023)  Transportation Needs: No Transportation Needs (07/15/2023)  Utilities: Not At Risk (07/15/2023)  Social Connections: Unknown (07/15/2023)  Tobacco Use: High Risk (07/15/2023)   Readmission Risk Interventions    07/17/2023   10:00 AM  Readmission Risk Prevention Plan  Transportation Screening Complete  HRI or Home Care Consult Complete  Social Work Consult for Recovery Care Planning/Counseling Complete  Palliative Care Screening Not Applicable  Medication Review Oceanographer) Complete

## 2023-07-19 NOTE — Evaluation (Signed)
Occupational Therapy Evaluation Patient Details Name: Morgan Torres MRN: [id number] DOB: [birth year] Today's Date: 07/19/2023   History of Present Illness 73 year old presents with hyponatremia, dizziness, weakness. Past medical history significant for metastatic urothelial cancer stage II post left nephroureterectomy 03/09/2021 by Dr. Alvester Morin, follow-up with Dr. Shirline Frees,  hypertension, non-STEMI 2014, previously spinal stenosis with neurogenic claudication status post L5-S1 decompression fusion, chronic pain   Clinical Impression   Pt presents with decline in function and safety with ADLs and ADL mobility with impaired strength balance and endurance. PTA pt lived at home with her husband and was Ind with ADLs/selfcare, IADLs and mobility. Pt currently very deconditioned and required min A/CGA with LB ADLs and CGA with mobility using RW. Pt would benefit from acute OT services to address impairments to maximize level of function and safety      If plan is discharge home, recommend the following: A little help with bathing/dressing/bathroom;A little help with walking and/or transfers;Assistance with cooking/housework;Assist for transportation;Help with stairs or ramp for entrance    Functional Status Assessment  Patient has had a recent decline in their functional status and demonstrates the ability to make significant improvements in function in a reasonable and predictable amount of time.  Equipment Recommendations  Tub/shower bench    Recommendations for Other Services       Precautions / Restrictions Precautions Precautions: Fall Restrictions Weight Bearing Restrictions Per Provider Order: No      Mobility Bed Mobility               General bed mobility comments: pt in recliner    Transfers Overall transfer level: Needs assistance Equipment used: Rolling walker (2 wheels) Transfers: Sit to/from Stand, Bed to chair/wheelchair/BSC Sit to Stand: Contact guard assist      Step pivot transfers: Contact guard assist     General transfer comment: pt reported light dizziness with sitting EOB, CGA for safety      Balance Overall balance assessment: No apparent balance deficits (not formally assessed)                                         ADL either performed or assessed with clinical judgement   ADL Overall ADL's : Needs assistance/impaired Eating/Feeding: Independent   Grooming: Wash/dry hands;Wash/dry face;Contact guard assist;Standing   Upper Body Bathing: Supervision/ safety;Sitting       Upper Body Dressing : Supervision/safety;Sitting   Lower Body Dressing: Minimal assistance Lower Body Dressing Details (indicate cue type and reason): assist due to fatigue, dizziness Toilet Transfer: Contact guard assist;Ambulation;Rolling walker (2 wheels)   Toileting- Clothing Manipulation and Hygiene: Contact guard assist;Sit to/from stand       Functional mobility during ADLs: Contact guard assist;Rolling walker (2 wheels)       Vision Baseline Vision/History: 5 Retinopathy;1 Wears glasses Ability to See in Adequate Light: 0 Adequate       Perception         Praxis         Pertinent Vitals/Pain Pain Assessment Pain Assessment: No/denies pain     Extremity/Trunk Assessment Upper Extremity Assessment Upper Extremity Assessment: Generalized weakness   Lower Extremity Assessment Lower Extremity Assessment: Defer to PT evaluation   Cervical / Trunk Assessment Cervical / Trunk Assessment: Normal   Communication Communication Communication: No apparent difficulties   Cognition Arousal: Alert Behavior During Therapy: WFL for tasks assessed/performed Overall Cognitive Status: Within Functional  Limits for tasks assessed                                       General Comments       Exercises     Shoulder Instructions      Home Living Family/patient expects to be discharged to:: Private  residence Living Arrangements: Spouse/significant other Available Help at Discharge: Family;Available PRN/intermittently Type of Home: House Home Access: Stairs to enter [employer] of Steps: 2 Entrance Stairs-Rails: Right Home Layout: One level     Bathroom Shower/Tub: Chief Strategy Officer: Standard Bathroom Accessibility: Yes   Home Equipment: Agricultural consultant (2 wheels);Cane - single point;Shower seat          Prior Functioning/Environment Prior Level of Function : Independent/Modified Independent             Mobility Comments: reports she has only been ambulating from bed to bathroom for the past 3 weeks, spouse works ADLs Comments: Ind with ADLs/selfcare, requires increased time; not been very mobilie for the past 3 weeks        OT Problem List: Decreased activity tolerance;Decreased strength;Impaired balance (sitting and/or standing);Decreased knowledge of use of DME or AE      OT Treatment/Interventions: Self-care/ADL training;DME and/or AE instruction;Therapeutic activities;Energy conservation;Patient/family education    OT Goals(Current goals can be found in the care plan section) Acute Rehab OT Goals Patient Stated Goal: go home OT Goal Formulation: With patient Time For Goal Achievement: 08/02/23 Potential to Achieve Goals: Good ADL Goals Pt Will Perform Grooming: with supervision;with set-up;standing Pt Will Perform Upper Body Bathing: with set-up;with modified independence Pt Will Perform Lower Body Bathing: with contact guard assist;with supervision;sit to/from stand Pt Will Perform Upper Body Dressing: with set-up;with modified independence Pt Will Perform Lower Body Dressing: with contact guard assist;with supervision;sit to/from stand Pt Will Transfer to Toilet: with supervision;with modified independence;ambulating Pt Will Perform Toileting - Clothing Manipulation and hygiene: with supervision;with modified independence;sit  to/from stand Additional ADL Goal #1: Pt will verbalize and demo 3 energy conservation strategies with ADLs and ADL mobility  OT Frequency: Min 1X/week    Co-evaluation              AM-PAC OT "6 Clicks" Daily Activity     Outcome Measure Help from another person eating meals?: None Help from another person taking care of personal grooming?: A Little Help from another person toileting, which includes using toliet, bedpan, or urinal?: A Little Help from another person bathing (including washing, rinsing, drying)?: A Little Help from another person to put on and taking off regular upper body clothing?: A Little Help from another person to put on and taking off regular lower body clothing?: A Little 6 Click Score: 19   End of Session Equipment Utilized During Treatment: Gait belt;Rolling walker (2 wheels)  Activity Tolerance: Patient limited by fatigue Patient left: in chair;with call bell/phone within reach;with nursing/sitter in room  OT Visit Diagnosis: Unsteadiness on feet (R26.81);Muscle weakness (generalized) (M62.81)                Time: 9629-5284 OT Time Calculation (min): 23 min Charges:  OT General Charges $OT Visit: 1 Visit OT Evaluation $OT Eval Low Complexity: 1 Low OT Treatments $Therapeutic Activity: 8-22 mins   Galen Manila 07/19/2023, 1:37 PM

## 2023-07-19 NOTE — Progress Notes (Signed)
PROGRESS NOTE    Morgan Torres  ZDG:[id number] DOB: 06-Jun-1951 DOA: 07/15/2023 PCP: [name], MD   Brief Narrative: 73 year old with past medical history significant for metastatic urothelial cancer stage II post left nephroureterectomy 03/09/2021 by Dr. Alvester Morin, follow-up with Dr. Shirline Frees,  hypertension, non-STEMI 2014, previously spinal stenosis with neurogenic claudication status post L5-S1 decompression fusion, chronic pain, presents with hyponatremia, dizziness, weakness.  She was found to have low sodium at 121 on 1/14.  Was started on sodium tablet by her oncology.  Continue to have abnormal labs was referred for further management to the ED.  Presented with sodium of 119, chest x-ray showed airway thickening bronchitis atelectasis, bronchopneumonia.     Assessment & Plan:   Principal Problem:   Hyponatremia   1-Hyponatremia: Urine osmolarity 209 Suspect related to SIADH in the setting of uncontrolled hypothyroidism, Continue with water restriction, started  sodium tablets on 1/22. Sodium down to 125---increase sodium tablet.  Repeat sodium this afternoon.    2-Hypothyroidism uncontrolled TSH: 129.825 She relates that she had not been taking as she is supposed too.  free T3: 0.3  free T4 0.37 Per pharmacy records patient has been prescribe 88 mcg and 50 mcg.  Unclear which one patient has been taking.. I will increase 75 mcg to 88   Mild bradycardia and hypotension: Amlodipine discontinue  and metoprolol dose reduced  Metastatic stage II urothelial CA Physician discussed with oncology apparently Balversa could cause hyponatremia this has been on hold  Bronchitis, PNA:  Chest x rary finding of PNA. She report cough, she is immunocompromise.  -Started on  IV ceftriaxone and Azithromycin.  -Started Mucinex MD. PRN albuterol.   Depression: Continue with temazepam  CKD Stage IIIb Monitor.     Estimated body mass index is 26.31 kg/m as calculated from the  following:   Height as of this encounter: 5\' 8"  (1.727 m).   Weight as of this encounter: 78.5 kg.   DVT prophylaxis: Lovenox Code Status: Full Code Family Communication: Care discussed with patient Disposition Plan:  Status is: Inpatient Remains inpatient appropriate because: management of hyponatremia    Consultants:  non  Procedures:  none  Antimicrobials:    Subjective: She is doing ok, report pain in her finger nail, suppose to have received some medications for fungus. Will discussed with pharmacy   Objective: Vitals:   07/19/23 0543 07/19/23 0800 07/19/23 0843 07/19/23 1154  BP: (!) 101/56 (!) 102/49  124/66  Pulse: (!) 54 (!) 57 92 (!) 51  Resp: 14   16  Temp: 97.8 F (36.6 C) 97.9 F (36.6 C)  97.7 F (36.5 C)  TempSrc: Oral Oral  Oral  SpO2: 98% 90%  100%  Weight:      Height:        Intake/Output Summary (Last 24 hours) at 07/19/2023 1743 Last data filed at 07/19/2023 9518 Gross per 24 hour  Intake 480 ml  Output 400 ml  Net 80 ml   Filed Weights   07/15/23 1835  Weight: 78.5 kg    Examination:  General exam: NAD Respiratory system: CTA Cardiovascular system: S 1, s 2 RRR Gastrointestinal system: BS present, soft, nt Extremities: no edema.     Data Reviewed: I have personally reviewed following labs and imaging studies  CBC: Recent Labs  Lab 07/15/23 1412 07/15/23 1612 07/16/23 0500 07/18/23 0327  WBC 5.2 5.1 4.7 5.9  NEUTROABS 1.9 1.9  --   --   HGB 10.1* 10.5* 9.2* 9.5*  HCT  27.8* 29.1* 26.4* 27.6*  MCV 91.7 93.6 95.7 97.9  PLT 278 271 239 239   Basic Metabolic Panel: Recent Labs  Lab 07/15/23 1412 07/15/23 1612 07/15/23 2348 07/16/23 0450 07/16/23 1350 07/17/23 2020 07/17/23 2345 07/18/23 0327 07/19/23 0026 07/19/23 1110  NA 119* 120* 123* 121*   < > 127* 127* 127* 125* 129*  K 4.7 4.7 4.1 4.1  --   --   --  4.6 4.6  --   CL 88* 88* 93* 95*  --   --   --  98 95*  --   CO2 25 22 22 22   --   --   --  21* 21*   --   GLUCOSE 94 94 83 81  --   --   --  86 82  --   BUN 9 10 10 9   --   --   --  9 9  --   CREATININE 1.61* 1.35* 1.38* 1.38*  --   --   --  1.47* 1.34*  --   CALCIUM 9.2 9.2 8.6* 8.0*  --   --   --  8.9 8.5*  --   PHOS 4.9*  --   --   --   --   --   --   --   --   --    < > = values in this interval not displayed.   GFR: Estimated Creatinine Clearance: 40.5 mL/min (A) (by C-G formula based on SCr of 1.34 mg/dL (H)). Liver Function Tests: Recent Labs  Lab 07/15/23 1412  AST 31  ALT 24  ALKPHOS 85  BILITOT 0.4  PROT 6.2*  ALBUMIN 3.5   No results for input(s): "LIPASE", "AMYLASE" in the last 168 hours. No results for input(s): "AMMONIA" in the last 168 hours. Coagulation Profile: No results for input(s): "INR", "PROTIME" in the last 168 hours. Cardiac Enzymes: No results for input(s): "CKTOTAL", "CKMB", "CKMBINDEX", "TROPONINI" in the last 168 hours. BNP (last 3 results) No results for input(s): "PROBNP" in the last 8760 hours. HbA1C: No results for input(s): "HGBA1C" in the last 72 hours. CBG: No results for input(s): "GLUCAP" in the last 168 hours. Lipid Profile: No results for input(s): "CHOL", "HDL", "LDLCALC", "TRIG", "CHOLHDL", "LDLDIRECT" in the last 72 hours. Thyroid Function Tests: Recent Labs    07/17/23 0935  TSH 129.825*  FREET4 0.37*  T3FREE <0.3*   Anemia Panel: No results for input(s): "VITAMINB12", "FOLATE", "FERRITIN", "TIBC", "IRON", "RETICCTPCT" in the last 72 hours. Sepsis Labs: No results for input(s): "PROCALCITON", "LATICACIDVEN" in the last 168 hours.  No results found for this or any previous visit (from the past 240 hours).       Radiology Studies: No results found.       Scheduled Meds:  aspirin EC  81 mg Oral Daily   azithromycin  500 mg Oral Daily   Chlorhexidine Gluconate Cloth  6 each Topical Daily   cilostazol  50 mg Oral BID   enoxaparin (LOVENOX) injection  40 mg Subcutaneous Q24H   guaiFENesin  1,200 mg Oral BID    levothyroxine  75 mcg Oral Q0600   metoprolol succinate  25 mg Oral Daily   sodium chloride flush  10-40 mL Intracatheter Q12H   sodium chloride  2 g Oral TID WC   Continuous Infusions:  cefTRIAXone (ROCEPHIN)  IV 2 g (07/19/23 0840)     LOS: 3 days    Time spent: 35 minutes    Mililani Murthy A Dennisha Mouser,  MD [facility]   If 7PM-7AM, please contact night-coverage [email or web address]  07/19/2023, 5:43 PM

## 2023-07-20 DIAGNOSIS — E871 Hypo-osmolality and hyponatremia: Secondary | ICD-10-CM | POA: Diagnosis not present

## 2023-07-20 LAB — HEPATIC FUNCTION PANEL
ALT: 6 U/L (ref 0–44)
AST: 40 U/L (ref 15–41)
Albumin: 2.8 g/dL — ABNORMAL LOW (ref 3.5–5.0)
Alkaline Phosphatase: 63 U/L (ref 38–126)
Bilirubin, Direct: <0.1 mg/dL (ref 0.0–0.2)
Total Bilirubin: 0.5 mg/dL (ref 0.0–1.2)
Total Protein: 5.5 g/dL — ABNORMAL LOW (ref 6.5–8.1)

## 2023-07-20 LAB — BASIC METABOLIC PANEL
Anion gap: 9 (ref 5–15)
BUN: 11 mg/dL (ref 8–23)
CO2: 24 mmol/L (ref 22–32)
Calcium: 9.1 mg/dL (ref 8.9–10.3)
Chloride: 98 mmol/L (ref 98–111)
Creatinine, Ser: 1.46 mg/dL — ABNORMAL HIGH (ref 0.44–1.00)
GFR, Estimated: 38 mL/min — ABNORMAL LOW (ref >60)
Glucose, Bld: 88 mg/dL (ref 70–99)
Potassium: 4.4 mmol/L (ref 3.5–5.1)
Sodium: 131 mmol/L — ABNORMAL LOW (ref 135–145)

## 2023-07-20 MED ORDER — HEPARIN SOD (PORK) LOCK FLUSH 100 UNIT/ML IV SOLN
500.0000 [IU] | INTRAVENOUS | Status: AC | PRN
  Administered 2023-07-20: 500 [IU]
  Filled 2023-07-20: qty 5

## 2023-07-20 MED ORDER — AMOXICILLIN-POT CLAVULANATE 875-125 MG PO TABS: 1.0000 | ORAL_TABLET | Freq: Two times a day (BID) | ORAL | 0 refills | Status: AC

## 2023-07-20 MED ORDER — LEVOTHYROXINE SODIUM 88 MCG PO TABS: 88.0000 ug | ORAL_TABLET | Freq: Every day | ORAL | 2 refills | Status: DC

## 2023-07-20 MED ORDER — CLOTRIMAZOLE 1 % EX SOLN: Freq: Two times a day (BID) | CUTANEOUS | 0 refills | Status: DC

## 2023-07-20 MED ORDER — METOPROLOL TARTRATE 25 MG PO TABS: 25.0000 mg | ORAL_TABLET | Freq: Every day | ORAL | 1 refills | Status: DC

## 2023-07-20 MED ORDER — GUAIFENESIN ER 600 MG PO TB12: 1200.0000 mg | ORAL_TABLET | Freq: Two times a day (BID) | ORAL | 0 refills | Status: AC

## 2023-07-20 MED ORDER — SODIUM CHLORIDE 1 G PO TABS: 2.0000 g | ORAL_TABLET | Freq: Three times a day (TID) | ORAL | 0 refills | Status: DC

## 2023-07-20 NOTE — Plan of Care (Signed)
  Problem: Education: Goal: Knowledge of General Education information will improve Description: Including pain rating scale, medication(s)/side effects and non-pharmacologic comfort measures Outcome: Adequate for Discharge   Problem: Health Behavior/Discharge Planning: Goal: Ability to manage health-related needs will improve Outcome: Adequate for Discharge   Problem: Clinical Measurements: Goal: Ability to maintain clinical measurements within normal limits will improve Outcome: Adequate for Discharge Goal: Will remain free from infection Outcome: Adequate for Discharge Goal: Diagnostic test results will improve Outcome: Adequate for Discharge Goal: Respiratory complications will improve Outcome: Adequate for Discharge Goal: Cardiovascular complication will be avoided Outcome: Adequate for Discharge   Problem: Activity: Goal: Risk for activity intolerance will decrease Outcome: Adequate for Discharge   Problem: Nutrition: Goal: Adequate nutrition will be maintained Outcome: Adequate for Discharge   Problem: Coping: Goal: Level of anxiety will decrease Outcome: Adequate for Discharge   Problem: Elimination: Goal: Will not experience complications related to bowel motility Outcome: Adequate for Discharge Goal: Will not experience complications related to urinary retention Outcome: Adequate for Discharge   Problem: Pain Managment: Goal: General experience of comfort will improve and/or be controlled Outcome: Adequate for Discharge   Problem: Safety: Goal: Ability to remain free from injury will improve Outcome: Adequate for Discharge   Problem: Skin Integrity: Goal: Risk for impaired skin integrity will decrease Outcome: Adequate for Discharge   Problem: Education: Goal: Knowledge of disease and its progression will improve Outcome: Adequate for Discharge   Problem: Health Behavior/Discharge Planning: Goal: Ability to manage health-related needs will  improve Outcome: Adequate for Discharge   Problem: Clinical Measurements: Goal: Complications related to the disease process or treatment will be avoided or minimized Outcome: Adequate for Discharge Goal: Dialysis access will remain free of complications Outcome: Adequate for Discharge   Problem: Activity: Goal: Activity intolerance will improve Outcome: Adequate for Discharge   Problem: Fluid Volume: Goal: Fluid volume balance will be maintained or improved Outcome: Adequate for Discharge   Problem: Nutritional: Goal: Ability to make appropriate dietary choices will improve Outcome: Adequate for Discharge   Problem: Respiratory: Goal: Respiratory symptoms related to disease process will be avoided Outcome: Adequate for Discharge   Problem: Self-Concept: Goal: Body image disturbance will be avoided or minimized Outcome: Adequate for Discharge   Problem: Urinary Elimination: Goal: Progression of disease will be identified and treated Outcome: Adequate for Discharge

## 2023-07-20 NOTE — Plan of Care (Signed)

## 2023-07-20 NOTE — Discharge Summary (Addendum)
Physician Discharge Summary   Patient: Morgan Torres MRN: [id number] DOB: 1951/04/09  Admit date:     07/15/2023  Discharge date: 07/20/23  Discharge Physician: Alba Cory   PCP: [name], MD   Recommendations at discharge:    Needs Bmet to monitor sodium level and to determine if she will need more refill of sodium tablet.  Encourage compliance with Synthroid.  Follow up with oncology  Metoprolol discontinue due to soft low BP and bradycardia.  Needs TSH in 4 weeks.   Discharge Diagnoses: Principal Problem:   Hyponatremia Hypothyroidism uncontrolled SIADH  Bronchitis, PNA:  Metastatic stage II urothelial CA CKD Stage IIIb   Hospital Course: 73 year old with past medical history significant for metastatic urothelial cancer stage II post left nephroureterectomy 03/09/2021 by Dr. Alvester Morin, follow-up with Dr. Shirline Frees, hypertension, non-STEMI 2014, previously spinal stenosis with neurogenic claudication status post L5-S1 decompression fusion, chronic pain, presents with hyponatremia, dizziness, weakness. She was found to have low sodium at 121 on 1/14. Was started on sodium tablet by her oncology. Continue to have abnormal labs was referred for further management to the ED. Presented with sodium of 119, chest x-ray showed airway thickening bronchitis atelectasis, bronchopneumonia.   Assessment and Plan: 1-Hyponatremia: Urine osmolarity 209 Suspect related to SIADH in the setting of uncontrolled hypothyroidism, Continue with water restriction, started  sodium tablets on 1/22. Sodium down to 125---129--131 Discharge on sodium tablet 2 gr TID, will provide tx for 7 days, she needs Bmet with PCP to determine if she will need further refill.  Needs to follow water restriction and compliance with Synthroid.      2-Hypothyroidism uncontrolled TSH: 129.825 She relates that she had not been taking as she is supposed too.  free T3: 0.3  free T4 0.37 Per pharmacy records patient  has been prescribe 88 mcg and 50 mcg.  Unclear which one patient has been taking.. Plan to discharge on Synthroid 88 mcg daily.    Mild bradycardia and hypotension: Amlodipine ane metoprolol discontinue    Metastatic stage II urothelial CA Physician discussed with oncology apparently Balversa could cause hyponatremia this has been on hold   Bronchitis, PNA:  Chest x rary finding of PNA. She report cough, she is immunocompromise.  -treated  on  IV ceftriaxone and Azithromycin.  -Started Mucinex MD. PRN albuterol.   discharge on Augmenting.   Depression: Continue with temazepam   CKD Stage IIIb Monitor.        Estimated body mass index is 26.31 kg/m as calculated from the following:   Height as of this encounter: 5\' 8"  (1.727 m).   Weight as of this encounter: 78.5 kg.          Consultants: None Procedures performed: None Disposition: Home Diet recommendation:  Discharge Diet Orders (From admission, onward)     Start     Ordered   07/20/23 0000  Diet general        07/20/23 1140           Regular diet DISCHARGE MEDICATION: Allergies as of 07/20/2023       Reactions   Diclofenac Anaphylaxis   Ciprofloxacin Rash   Codeine Itching   Hydrocodone Itching        Medication List     STOP taking these medications    amLODipine 5 MG tablet Commonly known as: NORVASC   atorvastatin 20 MG tablet Commonly known as: LIPITOR   calcium acetate 667 MG tablet Commonly known as: PHOSLO   erdafitinib 4  MG tablet Commonly known as: BALVERSA   HYDROcodone-acetaminophen 5-325 MG tablet Commonly known as: NORCO/VICODIN   loperamide 1 MG/5ML solution Commonly known as: IMODIUM   metoprolol tartrate 25 MG tablet Commonly known as: LOPRESSOR       TAKE these medications    acetaminophen 500 MG tablet Commonly known as: TYLENOL Take 1 tablet (500 mg total) by mouth every 6 (six) hours as needed for mild pain.   albuterol 108 (90 Base) MCG/ACT  inhaler Commonly known as: VENTOLIN HFA Inhale 2 puffs into the lungs every 6 (six) hours as needed for wheezing or shortness of breath.   amoxicillin-clavulanate 875-125 MG tablet Commonly known as: AUGMENTIN Take 1 tablet by mouth 2 (two) times daily for 3 days.   aspirin EC 81 MG tablet Take 81 mg by mouth daily. Swallow whole.   cilostazol 50 MG tablet Commonly known as: PLETAL Take 50 mg by mouth 2 (two) times daily.   clotrimazole 1 % external solution Commonly known as: LOTRIMIN Apply topically 2 (two) times daily.   EPINEPHrine 0.3 mg/0.3 mL Soaj injection Commonly known as: EpiPen 2-Pak Inject 0.3 mLs (0.3 mg total) into the muscle once as needed (for severe allergic reaction). CAll 911 immediately if you have to use this medicine   guaiFENesin 600 MG 12 hr tablet Commonly known as: MUCINEX Take 2 tablets (1,200 mg total) by mouth 2 (two) times daily.   levothyroxine 88 MCG tablet Commonly known as: SYNTHROID Take 1 tablet (88 mcg total) by mouth daily before breakfast. What changed: Another medication with the same name was removed. Continue taking this medication, and follow the directions you see here.   LORazepam 0.5 MG tablet Commonly known as: ATIVAN Take 0.5 mg by mouth daily.   nicotine 21 mg/24hr patch Commonly known as: NICODERM CQ - dosed in mg/24 hours Place 21 mg onto the skin daily.   nicotine polacrilex 4 MG gum Commonly known as: NICORETTE Take 4 mg by mouth as needed for smoking cessation.   nitroGLYCERIN 0.4 MG SL tablet Commonly known as: NITROSTAT Place 1 tablet (0.4 mg total) under the tongue every 5 (five) minutes x 3 doses as needed for chest pain.   oxybutynin 5 MG tablet Commonly known as: DITROPAN Take 1 tablet (5 mg total) by mouth every 8 (eight) hours as needed for bladder spasms.   polyethylene glycol 17 g packet Commonly known as: MIRALAX / GLYCOLAX Take 17 g by mouth as needed.   sodium chloride 1 g tablet Take 2  tablets (2 g total) by mouth 3 (three) times daily with meals for 7 days. What changed:  how much to take when to take this   temazepam 15 MG capsule Commonly known as: RESTORIL Take 1 capsule (15 mg total) by mouth at bedtime as needed for sleep.   traMADol 50 MG tablet Commonly known as: ULTRAM Take 50 mg by mouth every 6 (six) hours as needed for moderate pain (pain score 4-6). Not taking        Follow-up Information     [facility] Follow up.   Why: Iantha Fallen will provide PT and OT in the home after discharge. Contact information: [address] [place] [address] [phone]                Discharge Exam: Filed Weights   07/15/23 1835  Weight: 78.5 kg   General; NAD  Condition at discharge: stable  The results of significant diagnostics  from this hospitalization (including imaging, microbiology, ancillary and laboratory) are listed below for reference.   Imaging Studies: DG Chest Portable 1 View Result Date: 07/15/2023 CLINICAL DATA:  Cough.  Pulmonary nodules, bladder cancer. EXAM: PORTABLE CHEST 1 VIEW COMPARISON:  Chest CT 07/09/2023 FINDINGS: Chondro double right Port-A-Cath tip: SVC. Atherosclerotic calcification of the aortic arch. Heart size within normal limits. The pulmonary nodule seen at chest CT are less apparent on conventional radiography. Airway thickening is present, suggesting bronchitis or reactive airways disease. Confluent interstitial accentuation medially at the right lung base could represent atelectasis or bronchopneumonia. Degenerative glenohumeral arthropathy noted bilaterally. Thoracic spondylosis. IMPRESSION: 1. Airway thickening is present, suggesting bronchitis or reactive airways disease. 2. Confluent interstitial accentuation medially at the right lung base could represent atelectasis or bronchopneumonia. 3. The pulmonary nodules seen at chest CT are less apparent on conventional radiography. 4. Thoracic  spondylosis. 5. Degenerative glenohumeral arthropathy bilaterally. Electronically Signed   By: Gaylyn Rong M.D.   On: 07/15/2023 17:33   CT CHEST ABDOMEN PELVIS WO CONTRAST Result Date: 07/10/2023 CLINICAL DATA:  History of bladder cancer, restaging. * Tracking Code: BO * EXAM: CT CHEST, ABDOMEN AND PELVIS WITHOUT CONTRAST TECHNIQUE: Multidetector CT imaging of the chest, abdomen and pelvis was performed following the standard protocol without IV contrast. RADIATION DOSE REDUCTION: This exam was performed according to the departmental dose-optimization program which includes automated exposure control, adjustment of the mA and/or kV according to patient size and/or use of iterative reconstruction technique. COMPARISON:  Multiple priors including most recent CT April 22, 2023 FINDINGS: CT CHEST FINDINGS Cardiovascular: Extensive aortic atherosclerosis. Coronary artery calcifications lipomatous hypertrophy of the intra-atrial septum normal size heart. Right chest Port-A-Cath with tip at the superior cavoatrial junction Mediastinum/Nodes: No suspicious thyroid nodule. Enlarged mediastinal lymph nodes for instance a low right paratracheal/pre carinal lymph node measuring 15 mm in short axis on image 25/2 previously 6 mm Lungs/Pleura: Increased size of the dominant pulmonary nodule in the superior segment of the left lower lobe measuring 15 mm on image 54/4 previously 13 mm. Other scattered smaller pulmonary nodules are stable for instance a 4 mm nodule in the left upper lobe on image 35/4. New clustered nodularity in the right upper lobe on image 78/4 and right lower lobe for instance on image 125/4. New nodular/linear opacity in the right lower lobe with a nodular focus measuring up to 10 mm on image 121/4. Emphysematous change. Scattered atelectasis/scarring. No pleural effusion. No pneumothorax. Musculoskeletal: No aggressive lytic or blastic lesion of bone. Multilevel degenerative change of the spine.  Degenerative change of the bilateral shoulders CT ABDOMEN PELVIS FINDINGS Hepatobiliary: Similar too small to accurately characterize hypodensities in the inferior right lobe of the liver on image 70/2 and in the left lobe of the liver measuring 8 mm on image 60/2. Gallbladder is unremarkable. No biliary ductal dilation. Pancreas: No pancreatic ductal dilation or evidence of acute inflammation. Spleen: No splenomegaly Adrenals/Urinary Tract: No suspicious adrenal nodule. Prior left nephrectomy without new suspicious nodularity in the nephrectomy bed. No right-sided hydronephrosis or nephrolithiasis. Urinary bladder is unremarkable for degree of distension within limitation of streak artifact from left hip arthroplasty. Stomach/Bowel: Stomach is within normal limits. No evidence of bowel wall thickening, distention, or inflammatory changes. Vascular/Lymphatic: Aortic and branch vessel atherosclerosis. Smooth IVC contours. No pathologically enlarged abdominal or pelvic lymph nodes Reproductive: Status post hysterectomy. No adnexal masses. Other: No significant abdominopelvic free fluid. Musculoskeletal: No aggressive lytic or blastic lesion of bone. Severe multilevel degenerative change  of the spine. Lumbosacral fixation hardware. Left total hip arthroplasty. IMPRESSION: 1. Increased size of the dominant pulmonary nodule in the superior segment of the left lower lobe, other scattered smaller pulmonary nodules are not significantly changed. 2. New clustered nodularity in the right upper lobe and right lower lobe with a nodular focus measuring up to 10 mm in the right lower lobe, possibly infectious or inflammatory. Suggest attention on short-term interval follow-up CT. 3. Enlarged mediastinal lymph nodes, nonspecific may reflect nodal metastatic disease involvement or reactive. Attention on short-term interval follow-up suggested. 4. Prior left nephrectomy without evidence of local recurrence. 5. No evidence of  metastatic disease in the abdomen or pelvis. Electronically Signed   By: Maudry Mayhew M.D.   On: 07/10/2023 10:57    Microbiology: Results for orders placed or performed during the hospital encounter of 12/07/22  SARS CORONAVIRUS 2 (TAT 6-24 HRS) Anterior Nasal Swab     Status: None   Collection Time: 12/07/22  5:20 PM   Specimen: Anterior Nasal Swab  Result Value Ref Range Status   SARS Coronavirus 2 NEGATIVE NEGATIVE Final    Comment: (NOTE) SARS-CoV-2 target nucleic acids are NOT DETECTED.  The SARS-CoV-2 RNA is generally detectable in upper and lower respiratory specimens during the acute phase of infection. Negative results do not preclude SARS-CoV-2 infection, do not rule out co-infections with other pathogens, and should not be used as the sole basis for treatment or other patient management decisions. Negative results must be combined with clinical observations, patient history, and epidemiological information. The expected result is Negative.  Fact Sheet for Patients: HairSlick.no  Fact Sheet for Healthcare Providers: quierodirigir.com  This test is not yet approved or cleared by the [place] FDA and  has been authorized for detection and/or diagnosis of SARS-CoV-2 by FDA under an Emergency Use Authorization (EUA). This EUA will remain  in effect (meaning this test can be used) for the duration of the COVID-19 declaration under Se ction 564(b)(1) of the Act, 21 U.S.C. section 360bbb-3(b)(1), unless the authorization is terminated or revoked sooner.  Performed at [facility], [address] [place], [place] [address]     Labs: CBC: Recent Labs  Lab 07/15/23 1412 07/15/23 1612 07/16/23 0500 07/18/23 0327  WBC 5.2 5.1 4.7 5.9  NEUTROABS 1.9 1.9  --   --   HGB 10.1* 10.5* 9.2* 9.5*  HCT 27.8* 29.1* 26.4* 27.6*  MCV 91.7 93.6 95.7 97.9  PLT 278 271 239 239   Basic Metabolic Panel: Recent  Labs  Lab 07/15/23 1412 07/15/23 1612 07/15/23 2348 07/16/23 0450 07/16/23 1350 07/17/23 2345 07/18/23 0327 07/19/23 0026 07/19/23 1110 07/20/23 0500  NA 119*   < > 123* 121*   < > 127* 127* 125* 129* 131*  K 4.7   < > 4.1 4.1  --   --  4.6 4.6  --  4.4  CL 88*   < > 93* 95*  --   --  98 95*  --  98  CO2 25   < > 22 22  --   --  21* 21*  --  24  GLUCOSE 94   < > 83 81  --   --  86 82  --  88  BUN 9   < > 10 9  --   --  9 9  --  11  CREATININE 1.61*   < > 1.38* 1.38*  --   --  1.47* 1.34*  --  1.46*  CALCIUM 9.2   < > 8.6* 8.0*  --   --  8.9 8.5*  --  9.1  PHOS 4.9*  --   --   --   --   --   --   --   --   --    < > = values in this interval not displayed.   Liver Function Tests: Recent Labs  Lab 07/15/23 1412 07/20/23 0500  AST 31 40  ALT 24 6  ALKPHOS 85 63  BILITOT 0.4 0.5  PROT 6.2* 5.5*  ALBUMIN 3.5 2.8*   CBG: No results for input(s): "GLUCAP" in the last 168 hours.  Discharge time spent: greater than 30 minutes.  Signed: Alba Cory, MD [facility] 07/20/2023

## 2023-07-23 DIAGNOSIS — E871 Hypo-osmolality and hyponatremia: Secondary | ICD-10-CM | POA: Diagnosis not present

## 2023-07-23 DIAGNOSIS — E222 Syndrome of inappropriate secretion of antidiuretic hormone: Secondary | ICD-10-CM | POA: Diagnosis not present

## 2023-07-23 DIAGNOSIS — R531 Weakness: Secondary | ICD-10-CM | POA: Diagnosis not present

## 2023-07-24 DIAGNOSIS — E871 Hypo-osmolality and hyponatremia: Secondary | ICD-10-CM | POA: Diagnosis not present

## 2023-07-25 DIAGNOSIS — H16143 Punctate keratitis, bilateral: Secondary | ICD-10-CM | POA: Diagnosis not present

## 2023-07-25 DIAGNOSIS — H04123 Dry eye syndrome of bilateral lacrimal glands: Secondary | ICD-10-CM | POA: Diagnosis not present

## 2023-07-26 DIAGNOSIS — J440 Chronic obstructive pulmonary disease with acute lower respiratory infection: Secondary | ICD-10-CM | POA: Diagnosis not present

## 2023-07-26 DIAGNOSIS — C679 Malignant neoplasm of bladder, unspecified: Secondary | ICD-10-CM | POA: Diagnosis not present

## 2023-07-26 DIAGNOSIS — C7910 Secondary malignant neoplasm of unspecified urinary organs: Secondary | ICD-10-CM | POA: Diagnosis not present

## 2023-07-26 DIAGNOSIS — D630 Anemia in neoplastic disease: Secondary | ICD-10-CM | POA: Diagnosis not present

## 2023-07-26 DIAGNOSIS — C7800 Secondary malignant neoplasm of unspecified lung: Secondary | ICD-10-CM | POA: Diagnosis not present

## 2023-07-26 DIAGNOSIS — E222 Syndrome of inappropriate secretion of antidiuretic hormone: Secondary | ICD-10-CM | POA: Diagnosis not present

## 2023-07-26 DIAGNOSIS — I739 Peripheral vascular disease, unspecified: Secondary | ICD-10-CM | POA: Diagnosis not present

## 2023-07-29 ENCOUNTER — Ambulatory Visit (HOSPITAL_COMMUNITY)
Admission: RE | Admit: 2023-07-29 | Discharge: 2023-07-29 | Disposition: A | Discharge status: Home / Self Care | Payer: Medicare Other | Source: Ambulatory Visit | Attending: Physician Assistant | Admitting: Physician Assistant

## 2023-07-29 DIAGNOSIS — C679 Malignant neoplasm of bladder, unspecified: Secondary | ICD-10-CM | POA: Insufficient documentation

## 2023-07-29 DIAGNOSIS — C7800 Secondary malignant neoplasm of unspecified lung: Secondary | ICD-10-CM | POA: Insufficient documentation

## 2023-07-29 LAB — GLUCOSE, CAPILLARY: Glucose-Capillary: 85 mg/dL (ref 70–99)

## 2023-07-29 MED ORDER — FLUDEOXYGLUCOSE F - 18 (FDG) INJECTION
8.5000 | Freq: Once | INTRAVENOUS | Status: AC | PRN
  Administered 2023-07-29: 8.59 via INTRAVENOUS

## 2023-07-30 DIAGNOSIS — E871 Hypo-osmolality and hyponatremia: Secondary | ICD-10-CM | POA: Diagnosis not present

## 2023-07-30 DIAGNOSIS — E039 Hypothyroidism, unspecified: Secondary | ICD-10-CM | POA: Diagnosis not present

## 2023-07-30 DIAGNOSIS — Z720 Tobacco use: Secondary | ICD-10-CM | POA: Diagnosis not present

## 2023-07-30 DIAGNOSIS — D630 Anemia in neoplastic disease: Secondary | ICD-10-CM | POA: Diagnosis not present

## 2023-07-30 DIAGNOSIS — J449 Chronic obstructive pulmonary disease, unspecified: Secondary | ICD-10-CM | POA: Diagnosis not present

## 2023-07-30 DIAGNOSIS — C679 Malignant neoplasm of bladder, unspecified: Secondary | ICD-10-CM | POA: Diagnosis not present

## 2023-07-30 DIAGNOSIS — C7910 Secondary malignant neoplasm of unspecified urinary organs: Secondary | ICD-10-CM | POA: Diagnosis not present

## 2023-07-31 DIAGNOSIS — E222 Syndrome of inappropriate secretion of antidiuretic hormone: Secondary | ICD-10-CM | POA: Diagnosis not present

## 2023-07-31 DIAGNOSIS — C7910 Secondary malignant neoplasm of unspecified urinary organs: Secondary | ICD-10-CM | POA: Diagnosis not present

## 2023-07-31 DIAGNOSIS — J440 Chronic obstructive pulmonary disease with acute lower respiratory infection: Secondary | ICD-10-CM | POA: Diagnosis not present

## 2023-07-31 DIAGNOSIS — C679 Malignant neoplasm of bladder, unspecified: Secondary | ICD-10-CM | POA: Diagnosis not present

## 2023-07-31 DIAGNOSIS — I739 Peripheral vascular disease, unspecified: Secondary | ICD-10-CM | POA: Diagnosis not present

## 2023-07-31 DIAGNOSIS — C7800 Secondary malignant neoplasm of unspecified lung: Secondary | ICD-10-CM | POA: Diagnosis not present

## 2023-07-31 DIAGNOSIS — J449 Chronic obstructive pulmonary disease, unspecified: Secondary | ICD-10-CM | POA: Diagnosis not present

## 2023-07-31 DIAGNOSIS — D630 Anemia in neoplastic disease: Secondary | ICD-10-CM | POA: Diagnosis not present

## 2023-07-31 DIAGNOSIS — E871 Hypo-osmolality and hyponatremia: Secondary | ICD-10-CM | POA: Diagnosis not present

## 2023-08-01 DIAGNOSIS — I739 Peripheral vascular disease, unspecified: Secondary | ICD-10-CM | POA: Diagnosis not present

## 2023-08-01 DIAGNOSIS — C679 Malignant neoplasm of bladder, unspecified: Secondary | ICD-10-CM | POA: Diagnosis not present

## 2023-08-01 DIAGNOSIS — D630 Anemia in neoplastic disease: Secondary | ICD-10-CM | POA: Diagnosis not present

## 2023-08-01 DIAGNOSIS — E222 Syndrome of inappropriate secretion of antidiuretic hormone: Secondary | ICD-10-CM | POA: Diagnosis not present

## 2023-08-01 DIAGNOSIS — H04123 Dry eye syndrome of bilateral lacrimal glands: Secondary | ICD-10-CM | POA: Diagnosis not present

## 2023-08-01 DIAGNOSIS — H16143 Punctate keratitis, bilateral: Secondary | ICD-10-CM | POA: Diagnosis not present

## 2023-08-01 DIAGNOSIS — J440 Chronic obstructive pulmonary disease with acute lower respiratory infection: Secondary | ICD-10-CM | POA: Diagnosis not present

## 2023-08-01 DIAGNOSIS — C7800 Secondary malignant neoplasm of unspecified lung: Secondary | ICD-10-CM | POA: Diagnosis not present

## 2023-08-01 DIAGNOSIS — C7910 Secondary malignant neoplasm of unspecified urinary organs: Secondary | ICD-10-CM | POA: Diagnosis not present

## 2023-08-02 DIAGNOSIS — E222 Syndrome of inappropriate secretion of antidiuretic hormone: Secondary | ICD-10-CM | POA: Diagnosis not present

## 2023-08-02 DIAGNOSIS — C7910 Secondary malignant neoplasm of unspecified urinary organs: Secondary | ICD-10-CM | POA: Diagnosis not present

## 2023-08-02 DIAGNOSIS — C679 Malignant neoplasm of bladder, unspecified: Secondary | ICD-10-CM | POA: Diagnosis not present

## 2023-08-02 DIAGNOSIS — C7800 Secondary malignant neoplasm of unspecified lung: Secondary | ICD-10-CM | POA: Diagnosis not present

## 2023-08-02 DIAGNOSIS — D630 Anemia in neoplastic disease: Secondary | ICD-10-CM | POA: Diagnosis not present

## 2023-08-02 DIAGNOSIS — I739 Peripheral vascular disease, unspecified: Secondary | ICD-10-CM | POA: Diagnosis not present

## 2023-08-02 DIAGNOSIS — J440 Chronic obstructive pulmonary disease with acute lower respiratory infection: Secondary | ICD-10-CM | POA: Diagnosis not present

## 2023-08-05 DIAGNOSIS — E222 Syndrome of inappropriate secretion of antidiuretic hormone: Secondary | ICD-10-CM | POA: Diagnosis not present

## 2023-08-05 DIAGNOSIS — J440 Chronic obstructive pulmonary disease with acute lower respiratory infection: Secondary | ICD-10-CM | POA: Diagnosis not present

## 2023-08-05 DIAGNOSIS — C679 Malignant neoplasm of bladder, unspecified: Secondary | ICD-10-CM | POA: Diagnosis not present

## 2023-08-05 DIAGNOSIS — C7910 Secondary malignant neoplasm of unspecified urinary organs: Secondary | ICD-10-CM | POA: Diagnosis not present

## 2023-08-05 DIAGNOSIS — D630 Anemia in neoplastic disease: Secondary | ICD-10-CM | POA: Diagnosis not present

## 2023-08-05 DIAGNOSIS — I739 Peripheral vascular disease, unspecified: Secondary | ICD-10-CM | POA: Diagnosis not present

## 2023-08-05 DIAGNOSIS — C7800 Secondary malignant neoplasm of unspecified lung: Secondary | ICD-10-CM | POA: Diagnosis not present

## 2023-08-06 DIAGNOSIS — C7910 Secondary malignant neoplasm of unspecified urinary organs: Secondary | ICD-10-CM | POA: Diagnosis not present

## 2023-08-06 DIAGNOSIS — C7800 Secondary malignant neoplasm of unspecified lung: Secondary | ICD-10-CM | POA: Diagnosis not present

## 2023-08-06 DIAGNOSIS — E222 Syndrome of inappropriate secretion of antidiuretic hormone: Secondary | ICD-10-CM | POA: Diagnosis not present

## 2023-08-06 DIAGNOSIS — J440 Chronic obstructive pulmonary disease with acute lower respiratory infection: Secondary | ICD-10-CM | POA: Diagnosis not present

## 2023-08-06 DIAGNOSIS — D630 Anemia in neoplastic disease: Secondary | ICD-10-CM | POA: Diagnosis not present

## 2023-08-06 DIAGNOSIS — C679 Malignant neoplasm of bladder, unspecified: Secondary | ICD-10-CM | POA: Diagnosis not present

## 2023-08-06 DIAGNOSIS — I739 Peripheral vascular disease, unspecified: Secondary | ICD-10-CM | POA: Diagnosis not present

## 2023-08-08 ENCOUNTER — Inpatient Hospital Stay: Payer: Medicare Other | Admitting: Internal Medicine

## 2023-08-08 ENCOUNTER — Encounter: Payer: Self-pay | Admitting: Internal Medicine

## 2023-08-08 ENCOUNTER — Inpatient Hospital Stay: Payer: Medicare Other | Attending: Internal Medicine

## 2023-08-08 VITALS — BP 154/44 | HR 85 | Temp 98.1°F | Resp 18 | Wt 171.2 lb

## 2023-08-08 DIAGNOSIS — M549 Dorsalgia, unspecified: Secondary | ICD-10-CM | POA: Insufficient documentation

## 2023-08-08 DIAGNOSIS — Z9226 Personal history of immune checkpoint inhibitor therapy: Secondary | ICD-10-CM | POA: Insufficient documentation

## 2023-08-08 DIAGNOSIS — Z7902 Long term (current) use of antithrombotics/antiplatelets: Secondary | ICD-10-CM | POA: Insufficient documentation

## 2023-08-08 DIAGNOSIS — Z885 Allergy status to narcotic agent status: Secondary | ICD-10-CM | POA: Diagnosis not present

## 2023-08-08 DIAGNOSIS — Z79899 Other long term (current) drug therapy: Secondary | ICD-10-CM | POA: Insufficient documentation

## 2023-08-08 DIAGNOSIS — D174 Benign lipomatous neoplasm of intrathoracic organs: Secondary | ICD-10-CM | POA: Diagnosis not present

## 2023-08-08 DIAGNOSIS — G8929 Other chronic pain: Secondary | ICD-10-CM | POA: Diagnosis not present

## 2023-08-08 DIAGNOSIS — I2510 Atherosclerotic heart disease of native coronary artery without angina pectoris: Secondary | ICD-10-CM | POA: Insufficient documentation

## 2023-08-08 DIAGNOSIS — E222 Syndrome of inappropriate secretion of antidiuretic hormone: Secondary | ICD-10-CM | POA: Insufficient documentation

## 2023-08-08 DIAGNOSIS — E871 Hypo-osmolality and hyponatremia: Secondary | ICD-10-CM | POA: Insufficient documentation

## 2023-08-08 DIAGNOSIS — C7800 Secondary malignant neoplasm of unspecified lung: Secondary | ICD-10-CM | POA: Insufficient documentation

## 2023-08-08 DIAGNOSIS — R682 Dry mouth, unspecified: Secondary | ICD-10-CM | POA: Insufficient documentation

## 2023-08-08 DIAGNOSIS — M47814 Spondylosis without myelopathy or radiculopathy, thoracic region: Secondary | ICD-10-CM | POA: Diagnosis not present

## 2023-08-08 DIAGNOSIS — I252 Old myocardial infarction: Secondary | ICD-10-CM | POA: Insufficient documentation

## 2023-08-08 DIAGNOSIS — R634 Abnormal weight loss: Secondary | ICD-10-CM | POA: Insufficient documentation

## 2023-08-08 DIAGNOSIS — R400 Somnolence: Secondary | ICD-10-CM | POA: Insufficient documentation

## 2023-08-08 DIAGNOSIS — Z881 Allergy status to other antibiotic agents status: Secondary | ICD-10-CM | POA: Insufficient documentation

## 2023-08-08 DIAGNOSIS — B351 Tinea unguium: Secondary | ICD-10-CM | POA: Insufficient documentation

## 2023-08-08 DIAGNOSIS — C7910 Secondary malignant neoplasm of unspecified urinary organs: Secondary | ICD-10-CM | POA: Diagnosis not present

## 2023-08-08 DIAGNOSIS — I700 Atherosclerosis of aorta: Secondary | ICD-10-CM | POA: Diagnosis not present

## 2023-08-08 DIAGNOSIS — Z79890 Hormone replacement therapy (postmenopausal): Secondary | ICD-10-CM | POA: Insufficient documentation

## 2023-08-08 DIAGNOSIS — C679 Malignant neoplasm of bladder, unspecified: Secondary | ICD-10-CM | POA: Insufficient documentation

## 2023-08-08 DIAGNOSIS — R5383 Other fatigue: Secondary | ICD-10-CM | POA: Insufficient documentation

## 2023-08-08 DIAGNOSIS — Z905 Acquired absence of kidney: Secondary | ICD-10-CM | POA: Diagnosis not present

## 2023-08-08 DIAGNOSIS — Z5112 Encounter for antineoplastic immunotherapy: Secondary | ICD-10-CM | POA: Diagnosis not present

## 2023-08-08 DIAGNOSIS — E039 Hypothyroidism, unspecified: Secondary | ICD-10-CM

## 2023-08-08 LAB — CBC WITH DIFFERENTIAL (CANCER CENTER ONLY)
Abs Immature Granulocytes: 0.01 10*3/uL (ref 0.00–0.07)
Basophils Absolute: 0 10*3/uL (ref 0.0–0.1)
Basophils Relative: 1 %
Eosinophils Absolute: 0.3 10*3/uL (ref 0.0–0.5)
Eosinophils Relative: 4 %
HCT: 29 % — ABNORMAL LOW (ref 36.0–46.0)
Hemoglobin: 9.7 g/dL — ABNORMAL LOW (ref 12.0–15.0)
Immature Granulocytes: 0 %
Lymphocytes Relative: 35 %
Lymphs Abs: 2 10*3/uL (ref 0.7–4.0)
MCH: 33.9 pg (ref 26.0–34.0)
MCHC: 33.4 g/dL (ref 30.0–36.0)
MCV: 101.4 fL — ABNORMAL HIGH (ref 80.0–100.0)
Monocytes Absolute: 0.5 10*3/uL (ref 0.1–1.0)
Monocytes Relative: 9 %
Neutro Abs: 3 10*3/uL (ref 1.7–7.7)
Neutrophils Relative %: 51 %
Platelet Count: 291 10*3/uL (ref 150–400)
RBC: 2.86 MIL/uL — ABNORMAL LOW (ref 3.87–5.11)
RDW: 15.7 % — ABNORMAL HIGH (ref 11.5–15.5)
WBC Count: 5.9 10*3/uL (ref 4.0–10.5)
nRBC: 0 % (ref 0.0–0.2)

## 2023-08-08 LAB — CMP (CANCER CENTER ONLY)
ALT: 14 U/L (ref 0–44)
AST: 16 U/L (ref 15–41)
Albumin: 3.6 g/dL (ref 3.5–5.0)
Alkaline Phosphatase: 78 U/L (ref 38–126)
Anion gap: 4 — ABNORMAL LOW (ref 5–15)
BUN: 17 mg/dL (ref 8–23)
CO2: 28 mmol/L (ref 22–32)
Calcium: 8.9 mg/dL (ref 8.9–10.3)
Chloride: 104 mmol/L (ref 98–111)
Creatinine: 1.25 mg/dL — ABNORMAL HIGH (ref 0.44–1.00)
GFR, Estimated: 45 mL/min — ABNORMAL LOW (ref >60)
Glucose, Bld: 109 mg/dL — ABNORMAL HIGH (ref 70–99)
Potassium: 4.1 mmol/L (ref 3.5–5.1)
Sodium: 136 mmol/L (ref 135–145)
Total Bilirubin: 0.3 mg/dL (ref 0.0–1.2)
Total Protein: 6.3 g/dL — ABNORMAL LOW (ref 6.5–8.1)

## 2023-08-08 LAB — TSH: TSH: 39.778 u[IU]/mL — ABNORMAL HIGH (ref 0.350–4.500)

## 2023-08-08 MED ORDER — ONDANSETRON HCL 8 MG PO TABS: 8.0000 mg | ORAL_TABLET | Freq: Three times a day (TID) | ORAL | 1 refills | Status: DC | PRN

## 2023-08-08 MED ORDER — CARBOXYMETHYLCELL-GLYCERIN PF 0.5-0.9 % OP SOLN: 2.0000 [drp] | Freq: Four times a day (QID) | OPHTHALMIC | 11 refills | Status: DC

## 2023-08-08 NOTE — Progress Notes (Signed)
DISCONTINUE ON PATHWAY REGIMEN - Bladder     A cycle is every 28 days:     Erdafitinib   **Always confirm dose/schedule in your pharmacy ordering system**  PRIOR TREATMENT: [id number]: Erdafitinib 8 mg Once Daily Until Progression or Unacceptable Toxicity  START ON PATHWAY REGIMEN - Bladder     A cycle is every 28 days:     Enfortumab vedotin-ejfv   **Always confirm dose/schedule in your pharmacy ordering system**  Patient Characteristics: Advanced/Metastatic Disease, Third Line and Beyond, HER2 Negative/Unknown Therapeutic Status: Advanced/Metastatic Disease Line of Therapy: Third Line and Beyond HER2 Expression Status by IHC: Did Not Order Test Intent of Therapy: Non-Curative / Palliative Intent, Discussed with Patient

## 2023-08-08 NOTE — Progress Notes (Signed)
[facility] [phone]   [phone]  OFFICE PROGRESS NOTE  [name], MD [address] [place] [address]  DIAGNOSIS: Metastatic urothelial carcinoma that was initially diagnosed as stage II (T2, N0, M0) in September 2022 status post right nephroureterectomy as well as intravesical treatment and resection of superficial tumor in the bladder several times and March 2023 as well as July 2023.  The patient was found to have enlarging and new pulmonary nodules consistent with metastatic disease in November 2023.    PRIOR THERAPY: 1) Right nephroureterectomy as well as intravesical treatment and resection of superficial tumor in the bladder several times and March 2023 as well as July 2023.  2) Palliative systemic chemotherapy with carboplatin for AUC of 5 on day 1 and gemcitabine 1000 mg/M2 on days 1 and 8 every 3 weeks. First cycle June 21, 2022. Status post 6 cycles. Her dose of carboplatin was reduced to AUC of 4 and gemcitabine to 800 mg/m2 starting from cycle #5 due to cytopenias. 3) Maintenance immunotherapy with Avelumab 800 mg IV every 2 weeks. First dose on 11/15/22. Status post 6 cycle of treatment.   CURRENT THERAPY: Erdafitinib (balversa) 8 mg p.o daily. First dose 02/22/23.  Status post 4 months of treatment.  INTERVAL HISTORY: Morgan Torres 73 y.o. female returns to the clinic today for follow-up visit accompanied by her husband. Discussed the use of AI scribe software for clinical note transcription with the patient, who gave verbal consent to proceed.  History of Present Illness   Morgan Torres is a 73 year old female with metastatic urothelial carcinoma who presents for evaluation of disease progression and treatment options.  She was initially diagnosed with metastatic urothelial carcinoma in 2023. Her treatment began with chemotherapy using carboplatin and gemcitabine, followed by maintenance immunotherapy with avelumab,  which was discontinued due to disease progression. Subsequently, she was started on oral erdafitinib, which she has been taking for the past five months. Despite this treatment, recent imaging, including a PET scan, has shown progression of her cancer with new lung nodules and increased mediastinal and hilar lymph nodes.  She has a history of hyponatremia, which previously led to hospitalization. She is currently managing this condition with salt tablets.  She has experienced pruritus over the past two nights, which was alleviated by taking Benadryl. However, she notes that Benadryl causes drowsiness.        MEDICAL HISTORY: Past Medical History:  Diagnosis Date   Anxiety    Arthritis    hip, lumbar spine    Asthma    seasonal allergies    Bronchitis    Hx: of   Chronic kidney disease    COPD (chronic obstructive pulmonary disease) (HCC)    Coronary artery disease    Depression    Fibromyalgia    Hypertension    Lumbar herniated disc    Myocardial infarction (HCC) 06/25/2012   followed by Dr. Rennis Golden, treated medically, no stents   Peripheral arterial disease (HCC)    of right foot   Pneumonia    Hx: of yrs ago   Sciatica    Sleep apnea    Small bowel obstruction (HCC)    several yrs ago    ALLERGIES:  is allergic to diclofenac, ciprofloxacin, codeine, and hydrocodone.  MEDICATIONS:  Current Outpatient Medications  Medication Sig Dispense Refill   acetaminophen (TYLENOL) 500 MG tablet Take 1 tablet (500 mg total) by mouth every  6 (six) hours as needed for mild pain. 30 tablet 0   albuterol (PROVENTIL HFA;VENTOLIN HFA) 108 (90 Base) MCG/ACT inhaler Inhale 2 puffs into the lungs every 6 (six) hours as needed for wheezing or shortness of breath. 1 Inhaler 0   aspirin EC 81 MG tablet Take 81 mg by mouth daily. Swallow whole.     cilostazol (PLETAL) 50 MG tablet Take 50 mg by mouth 2 (two) times daily.     clotrimazole (LOTRIMIN) 1 % external solution Apply topically 2 (two)  times daily. 30 mL 0   EPINEPHrine (EPIPEN 2-PAK) 0.3 mg/0.3 mL DEVI Inject 0.3 mLs (0.3 mg total) into the muscle once as needed (for severe allergic reaction). CAll 911 immediately if you have to use this medicine 1 Device 1   guaiFENesin (MUCINEX) 600 MG 12 hr tablet Take 2 tablets (1,200 mg total) by mouth 2 (two) times daily. 120 tablet 0   levothyroxine (SYNTHROID) 88 MCG tablet Take 1 tablet (88 mcg total) by mouth daily before breakfast. 90 tablet 2   LORazepam (ATIVAN) 0.5 MG tablet Take 0.5 mg by mouth daily.     nicotine (NICODERM CQ - DOSED IN MG/24 HOURS) 21 mg/24hr patch Place 21 mg onto the skin daily.     nicotine polacrilex (NICORETTE) 4 MG gum Take 4 mg by mouth as needed for smoking cessation.     nitroGLYCERIN (NITROSTAT) 0.4 MG SL tablet Place 1 tablet (0.4 mg total) under the tongue every 5 (five) minutes x 3 doses as needed for chest pain. 25 tablet 2   oxybutynin (DITROPAN) 5 MG tablet Take 1 tablet (5 mg total) by mouth every 8 (eight) hours as needed for bladder spasms. 30 tablet 1   polyethylene glycol (MIRALAX / GLYCOLAX) 17 g packet Take 17 g by mouth as needed.     temazepam (RESTORIL) 15 MG capsule Take 1 capsule (15 mg total) by mouth at bedtime as needed for sleep. 30 capsule 0   traMADol (ULTRAM) 50 MG tablet Take 50 mg by mouth every 6 (six) hours as needed for moderate pain (pain score 4-6). Not taking     No current facility-administered medications for this visit.    SURGICAL HISTORY:  Past Surgical History:  Procedure Laterality Date   ABDOMINAL SURGERY     resection of small intestine   ANTERIOR CRUCIATE LIGAMENT REPAIR Bilateral    BACK SURGERY     fusion of lumbar   CARDIAC CATHETERIZATION  06/25/2012   COLON SURGERY     resection for bowel - for obstruction  6 to 7 yrs ago per pt on 01-05-2022   COLONOSCOPY     Hx; of   DILATION AND CURETTAGE OF UTERUS     EYE SURGERY Bilateral    cataracts removed   HERNIA REPAIR  02/23/1969   inguinal  hernia    IR IMAGING GUIDED PORT INSERTION  06/07/2022   LEFT HEART CATHETERIZATION WITH CORONARY ANGIOGRAM N/A 03/30/2013   Procedure: LEFT HEART CATHETERIZATION WITH CORONARY ANGIOGRAM;  Surgeon: Marykay Lex, MD;  Location: [facility] CATH LAB;  Service: Cardiovascular;  Laterality: N/A;   left thumb surgery     yrs ago   ROBOT ASSITED LAPAROSCOPIC NEPHROURETERECTOMY Left 03/09/2021   Procedure: XI ROBOT ASSITED LAPAROSCOPIC NEPHROURETERECTOMY/ CYSTOSCOPY WITH LEFT URETEROSCOPY WITH TRANSURETHRAL RESECTION OF URETERAL ORIFICE;  Surgeon: Rene Paci, MD;  Location: [facility] ORS;  Service: Urology;  Laterality: Left;   SALIVARY GLAND SURGERY Left    approached from inside mouth &  side of neck 1970-1980   TONSILLECTOMY     as child   TOTAL HIP ARTHROPLASTY Left 05/28/2014   Procedure: LEFT TOTAL HIP ARTHROPLASTY;  Surgeon: Thera Flake., MD;  Location: [facility] OR;  Service: Orthopedics;  Laterality: Left;   TRANSURETHRAL RESECTION OF BLADDER TUMOR N/A 08/23/2021   Procedure: TRANSURETHRAL RESECTION OF BLADDER TUMOR (TURBT) WITH CYSTOSCOPY/ POSTOPERATIVE INSTILLATION OF GEMCITABINE/retrograde pyelogram and stent placement (right);  Surgeon: Rene Paci, MD;  Location: [facility];  Service: Urology;  Laterality: N/A;   TRANSURETHRAL RESECTION OF BLADDER TUMOR N/A 01/10/2022   Procedure: TRANSURETHRAL RESECTION OF BLADDER TUMOR (TURBT) WITH CYSTOSCOPY / GEMCITABINE INSTILLATION post-operatively;  Surgeon: Rene Paci, MD;  Location: [facility];  Service: Urology;  Laterality: N/A;  ONLY NEEDS 30 MIN    REVIEW OF SYSTEMS:  Constitutional: positive for fatigue Eyes: negative Ears, nose, mouth, throat, and face: negative Respiratory: negative Cardiovascular: negative Gastrointestinal: negative Genitourinary:negative Integument/breast: positive for pruritus Hematologic/lymphatic: negative Musculoskeletal:negative Neurological:  negative Behavioral/Psych: negative Endocrine: negative Allergic/Immunologic: negative   PHYSICAL EXAMINATION: General appearance: alert, cooperative, fatigued, and no distress Head: Normocephalic, without obvious abnormality, atraumatic Neck: no adenopathy, no JVD, supple, symmetrical, trachea midline, and thyroid not enlarged, symmetric, no tenderness/mass/nodules Lymph nodes: Cervical, supraclavicular, and axillary nodes normal. Resp: clear to auscultation bilaterally Back: symmetric, no curvature. ROM normal. No CVA tenderness. Cardio: regular rate and rhythm, S1, S2 normal, no murmur, click, rub or gallop GI: soft, non-tender; bowel sounds normal; no masses,  no organomegaly Extremities: extremities normal, atraumatic, no cyanosis or edema Neurologic: Alert and oriented X 3, normal strength and tone. Normal symmetric reflexes. Normal coordination and gait  ECOG PERFORMANCE STATUS: 1 - Symptomatic but completely ambulatory  Blood pressure (!) 154/44, pulse 85, temperature 98.1 F (36.7 C), temperature source Temporal, resp. rate 18, weight 171 lb 3.2 oz (77.7 kg), SpO2 94%.  LABORATORY DATA: Lab Results  Component Value Date   WBC 5.9 07/18/2023   HGB 9.5 (L) 07/18/2023   HCT 27.6 (L) 07/18/2023   MCV 97.9 07/18/2023   PLT 239 07/18/2023      Chemistry      Component Value Date/Time   NA 131 (L) 07/20/2023 0500   K 4.4 07/20/2023 0500   CL 98 07/20/2023 0500   CO2 24 07/20/2023 0500   BUN 11 07/20/2023 0500   CREATININE 1.46 (H) 07/20/2023 0500   CREATININE 1.61 (H) 07/15/2023 1412      Component Value Date/Time   CALCIUM 9.1 07/20/2023 0500   ALKPHOS 63 07/20/2023 0500   AST 40 07/20/2023 0500   AST 31 07/15/2023 1412   ALT 6 07/20/2023 0500   ALT 24 07/15/2023 1412   BILITOT 0.5 07/20/2023 0500   BILITOT 0.4 07/15/2023 1412       RADIOGRAPHIC STUDIES: NM PET Image Restage (PS) Skull Base to Thigh (F-18 FDG) Result Date: 08/07/2023 CLINICAL DATA:   Subsequent treatment strategy for metastatic urothelial carcinoma. EXAM: NUCLEAR MEDICINE PET SKULL BASE TO THIGH TECHNIQUE: 8.59 mCi F-18 FDG was injected intravenously. Full-ring PET imaging was performed from the skull base to thigh after the radiotracer. CT data was obtained and used for attenuation correction and anatomic localization. Fasting blood glucose: 85 mg/dl COMPARISON:  CT chest, abdomen and pelvis 04/22/2023 FINDINGS: Mediastinal blood pool activity: SUV max 3.06 Liver activity: SUV max NA NECK: Mildly tracer avid right level 4 lymph node measures 5 mm with SUV max of 3.57, image 40/4. unchanged from 04/22/2023. Incidental CT  findings: None. CHEST: Bilateral tracer avid mediastinal lymph nodes. Index nodes include: -high right paratracheal lymph node measures 7 mm with SUV max of 12.30, image 49/4. Previously 3 mm. -Low right paratracheal lymph node measures 1.7 cm with SUV max of 17.13, image 69/4. Previously 0.5 cm. -Subcarinal lymph node measures 1.6 cm with SUV max of 20.89, image 77/4. Previously 0.6 cm. -Left pre-vascular lymph node measures 1.4 cm with SUV max of 16.68, image 66/4. Previously 0.7 cm. -Within the superior segment of the left lower lobe there is a tracer avid nodule which measures 1.7 cm with SUV max of 9.75, image 67/4. Previously 1.4 cm Incidental CT findings: Aortic atherosclerosis and coronary artery calcifications. ABDOMEN/PELVIS: No abnormal tracer uptake identified within the liver, pancreas, spleen. Mild increased tracer uptake within the left periaortic region has an SUV max of 4.53, this corresponds to a 1.3 cm left periaortic lymph node, image 115/4. Previously 0.6 cm. Incidental CT findings: Aortic atherosclerosis. Status post left nephrectomy. No abnormal tracer avid soft tissue nodules within the nephrectomy bed. SKELETON: No focal hypermetabolic activity to suggest skeletal metastasis. Incidental CT findings: None. IMPRESSION: 1. Tracer avid mediastinal lymph  nodes are increased in size compared with 04/22/2023 compatible with nodal metastasis. 2. Interval increase in size and tracer uptake associated with left periaortic lymph node compatible with nodal metastasis. 3. Tracer avid nodule within the superior segment of left lower lobe is increased in size compatible with pulmonary metastasis. Hello Dr. Stefanie Libel no are Ace 4. Status post left nephrectomy. No abnormal tracer avid soft tissue nodules within the nephrectomy bed. 5.  Aortic Atherosclerosis (ICD10-I70.0). Electronically Signed   By: Signa Kell M.D.   On: 08/07/2023 17:04   DG Chest Portable 1 View Result Date: 07/15/2023 CLINICAL DATA:  Cough.  Pulmonary nodules, bladder cancer. EXAM: PORTABLE CHEST 1 VIEW COMPARISON:  Chest CT 07/09/2023 FINDINGS: Chondro double right Port-A-Cath tip: SVC. Atherosclerotic calcification of the aortic arch. Heart size within normal limits. The pulmonary nodule seen at chest CT are less apparent on conventional radiography. Airway thickening is present, suggesting bronchitis or reactive airways disease. Confluent interstitial accentuation medially at the right lung base could represent atelectasis or bronchopneumonia. Degenerative glenohumeral arthropathy noted bilaterally. Thoracic spondylosis. IMPRESSION: 1. Airway thickening is present, suggesting bronchitis or reactive airways disease. 2. Confluent interstitial accentuation medially at the right lung base could represent atelectasis or bronchopneumonia. 3. The pulmonary nodules seen at chest CT are less apparent on conventional radiography. 4. Thoracic spondylosis. 5. Degenerative glenohumeral arthropathy bilaterally. Electronically Signed   By: Gaylyn Rong M.D.   On: 07/15/2023 17:33   CT CHEST ABDOMEN PELVIS WO CONTRAST Result Date: 07/10/2023 CLINICAL DATA:  History of bladder cancer, restaging. * Tracking Code: BO * EXAM: CT CHEST, ABDOMEN AND PELVIS WITHOUT CONTRAST TECHNIQUE: Multidetector CT imaging of  the chest, abdomen and pelvis was performed following the standard protocol without IV contrast. RADIATION DOSE REDUCTION: This exam was performed according to the departmental dose-optimization program which includes automated exposure control, adjustment of the mA and/or kV according to patient size and/or use of iterative reconstruction technique. COMPARISON:  Multiple priors including most recent CT April 22, 2023 FINDINGS: CT CHEST FINDINGS Cardiovascular: Extensive aortic atherosclerosis. Coronary artery calcifications lipomatous hypertrophy of the intra-atrial septum normal size heart. Right chest Port-A-Cath with tip at the superior cavoatrial junction Mediastinum/Nodes: No suspicious thyroid nodule. Enlarged mediastinal lymph nodes for instance a low right paratracheal/pre carinal lymph node measuring 15 mm in short axis on image 25/2 previously  6 mm Lungs/Pleura: Increased size of the dominant pulmonary nodule in the superior segment of the left lower lobe measuring 15 mm on image 54/4 previously 13 mm. Other scattered smaller pulmonary nodules are stable for instance a 4 mm nodule in the left upper lobe on image 35/4. New clustered nodularity in the right upper lobe on image 78/4 and right lower lobe for instance on image 125/4. New nodular/linear opacity in the right lower lobe with a nodular focus measuring up to 10 mm on image 121/4. Emphysematous change. Scattered atelectasis/scarring. No pleural effusion. No pneumothorax. Musculoskeletal: No aggressive lytic or blastic lesion of bone. Multilevel degenerative change of the spine. Degenerative change of the bilateral shoulders CT ABDOMEN PELVIS FINDINGS Hepatobiliary: Similar too small to accurately characterize hypodensities in the inferior right lobe of the liver on image 70/2 and in the left lobe of the liver measuring 8 mm on image 60/2. Gallbladder is unremarkable. No biliary ductal dilation. Pancreas: No pancreatic ductal dilation or evidence  of acute inflammation. Spleen: No splenomegaly Adrenals/Urinary Tract: No suspicious adrenal nodule. Prior left nephrectomy without new suspicious nodularity in the nephrectomy bed. No right-sided hydronephrosis or nephrolithiasis. Urinary bladder is unremarkable for degree of distension within limitation of streak artifact from left hip arthroplasty. Stomach/Bowel: Stomach is within normal limits. No evidence of bowel wall thickening, distention, or inflammatory changes. Vascular/Lymphatic: Aortic and branch vessel atherosclerosis. Smooth IVC contours. No pathologically enlarged abdominal or pelvic lymph nodes Reproductive: Status post hysterectomy. No adnexal masses. Other: No significant abdominopelvic free fluid. Musculoskeletal: No aggressive lytic or blastic lesion of bone. Severe multilevel degenerative change of the spine. Lumbosacral fixation hardware. Left total hip arthroplasty. IMPRESSION: 1. Increased size of the dominant pulmonary nodule in the superior segment of the left lower lobe, other scattered smaller pulmonary nodules are not significantly changed. 2. New clustered nodularity in the right upper lobe and right lower lobe with a nodular focus measuring up to 10 mm in the right lower lobe, possibly infectious or inflammatory. Suggest attention on short-term interval follow-up CT. 3. Enlarged mediastinal lymph nodes, nonspecific may reflect nodal metastatic disease involvement or reactive. Attention on short-term interval follow-up suggested. 4. Prior left nephrectomy without evidence of local recurrence. 5. No evidence of metastatic disease in the abdomen or pelvis. Electronically Signed   By: Maudry Mayhew M.D.   On: 07/10/2023 10:57    ASSESSMENT AND PLAN: This is a very pleasant 74 years old African-American female with metastatic urothelial carcinoma initially diagnosed in September 2022 as a stage II status post right nephro ureterectomy as well as intravesical treatment and resection of  superficial tumor in the bladder for several times.  The patient was found to have evidence of metastatic disease in the lung in November 2023.  She underwent palliative systemic chemotherapy with carboplatin and gemcitabine status post 6 cycles with stable disease followed by maintenance treatment with immunotherapy with Avelumab 800 Mg IV every 2 weeks status post 6 cycles.  This treatment was discontinued secondary to disease progression. The patient had repeat CT scan of the chest, abdomen and pelvis performed recently.  I personally and independently reviewed the scan and discussed the result with the patient today.  Unfortunately her scan showed evidence for disease progression with enlargement of bilateral pulmonary nodules. Her molecular studies showed positive FGFR mutation and the patient will be a good candidate for treatment with erdafitinib starting at a dose of 8 mg p.o. daily.  The patient is tolerating her treatment much better now with  no concerning complaints except for mild fatigue and paronychia. She had repeat PET scan performed recently that showed evidence for disease progression. I recommended for the patient to discontinue her current treatment with erdafitinib.    Metastatic Urothelial Carcinoma Metastatic urothelial carcinoma diagnosed in 2023, initially treated with carboplatin and gemcitabine, followed by avelumab, which was discontinued due to progression. Recent PET scan shows new lung nodules and increased mediastinal and hilar lymph nodes. Next treatment plan is enfortumab vedotin infusion therapy. Discussed enfortumab vedotin regimen (once a week for three weeks, followed by one week off, repeated every four weeks) and its side effects (fatigue, weakness, potential drop in blood counts). Given her young age and activity level, continuing treatment is recommended. - Discontinue erdafitinib after the last tablet. - Initiate enfortumab vedotin infusion therapy: once a week for  three weeks, followed by one week off, repeated every four weeks. - Monitor blood counts closely during treatment. - Schedule follow-up visits every other week to monitor treatment response and side effects.  Hyponatremia Hyponatremia, previously requiring hospitalization, managed with sodium tablets. Sodium levels need monitoring to prevent recurrence. - Continue sodium tablets. - Order blood work to check sodium levels.  Pruritus Experiencing itching for the past two nights, temporarily relieved by Benadryl. Suggested non-sedating antihistamines like Zyrtec or Claritin. - Recommend Zyrtec or Claritin for pruritus management.  Follow-up - Schedule follow-up visit in two weeks to assess treatment response and side effects. - Arrange for blood work before the next visit.   The patient was advised to call immediately if she has any concerning symptoms in the interval. The patient voices understanding of current disease status and treatment options and is in agreement with the current care plan.  All questions were answered. The patient knows to call the clinic with any problems, questions or concerns. We can certainly see the patient much sooner if necessary.  The total time spent in the appointment was 20 minutes.  Disclaimer: This note was dictated with voice recognition software. Similar sounding words can inadvertently be transcribed and may not be corrected upon review.

## 2023-08-09 DIAGNOSIS — C7910 Secondary malignant neoplasm of unspecified urinary organs: Secondary | ICD-10-CM | POA: Diagnosis not present

## 2023-08-09 DIAGNOSIS — C679 Malignant neoplasm of bladder, unspecified: Secondary | ICD-10-CM | POA: Diagnosis not present

## 2023-08-09 DIAGNOSIS — E222 Syndrome of inappropriate secretion of antidiuretic hormone: Secondary | ICD-10-CM | POA: Diagnosis not present

## 2023-08-09 DIAGNOSIS — J440 Chronic obstructive pulmonary disease with acute lower respiratory infection: Secondary | ICD-10-CM | POA: Diagnosis not present

## 2023-08-09 DIAGNOSIS — I739 Peripheral vascular disease, unspecified: Secondary | ICD-10-CM | POA: Diagnosis not present

## 2023-08-09 DIAGNOSIS — C7800 Secondary malignant neoplasm of unspecified lung: Secondary | ICD-10-CM | POA: Diagnosis not present

## 2023-08-09 DIAGNOSIS — D630 Anemia in neoplastic disease: Secondary | ICD-10-CM | POA: Diagnosis not present

## 2023-08-09 LAB — T4: T4, Total: 10 ug/dL (ref 4.5–12.0)

## 2023-08-09 NOTE — Progress Notes (Signed)
Pharmacist Chemotherapy Monitoring - Initial Assessment    Anticipated start date: 08/16/23   The following has been reviewed per standard work regarding the patient's treatment regimen: The patient's diagnosis, treatment plan and drug doses, and organ/hematologic function Lab orders and baseline tests specific to treatment regimen  The treatment plan start date, drug sequencing, and pre-medications Prior authorization status  Patient's documented medication list, including drug-drug interaction screen and prescriptions for anti-emetics and supportive care specific to the treatment regimen The drug concentrations, fluid compatibility, administration routes, and timing of the medications to be used The patient's access for treatment and lifetime cumulative dose history, if applicable  The patient's medication allergies and previous infusion related reactions, if applicable   Changes made to treatment plan:  N/A  Follow up needed:  Pending authorization for treatment    Jerry Caras, PharmD PGY2 Oncology Pharmacy Resident   08/09/2023 9:44 AM

## 2023-08-10 ENCOUNTER — Other Ambulatory Visit: Payer: Self-pay

## 2023-08-13 DIAGNOSIS — C7910 Secondary malignant neoplasm of unspecified urinary organs: Secondary | ICD-10-CM | POA: Diagnosis not present

## 2023-08-13 DIAGNOSIS — E222 Syndrome of inappropriate secretion of antidiuretic hormone: Secondary | ICD-10-CM | POA: Diagnosis not present

## 2023-08-13 DIAGNOSIS — C7800 Secondary malignant neoplasm of unspecified lung: Secondary | ICD-10-CM | POA: Diagnosis not present

## 2023-08-13 DIAGNOSIS — J440 Chronic obstructive pulmonary disease with acute lower respiratory infection: Secondary | ICD-10-CM | POA: Diagnosis not present

## 2023-08-13 DIAGNOSIS — C679 Malignant neoplasm of bladder, unspecified: Secondary | ICD-10-CM | POA: Diagnosis not present

## 2023-08-13 DIAGNOSIS — I739 Peripheral vascular disease, unspecified: Secondary | ICD-10-CM | POA: Diagnosis not present

## 2023-08-13 DIAGNOSIS — D630 Anemia in neoplastic disease: Secondary | ICD-10-CM | POA: Diagnosis not present

## 2023-08-15 DIAGNOSIS — J440 Chronic obstructive pulmonary disease with acute lower respiratory infection: Secondary | ICD-10-CM | POA: Diagnosis not present

## 2023-08-15 DIAGNOSIS — C7800 Secondary malignant neoplasm of unspecified lung: Secondary | ICD-10-CM | POA: Diagnosis not present

## 2023-08-15 DIAGNOSIS — E222 Syndrome of inappropriate secretion of antidiuretic hormone: Secondary | ICD-10-CM | POA: Diagnosis not present

## 2023-08-15 DIAGNOSIS — C7910 Secondary malignant neoplasm of unspecified urinary organs: Secondary | ICD-10-CM | POA: Diagnosis not present

## 2023-08-15 DIAGNOSIS — I739 Peripheral vascular disease, unspecified: Secondary | ICD-10-CM | POA: Diagnosis not present

## 2023-08-15 DIAGNOSIS — C679 Malignant neoplasm of bladder, unspecified: Secondary | ICD-10-CM | POA: Diagnosis not present

## 2023-08-15 DIAGNOSIS — D630 Anemia in neoplastic disease: Secondary | ICD-10-CM | POA: Diagnosis not present

## 2023-08-16 ENCOUNTER — Inpatient Hospital Stay: Payer: Medicare Other

## 2023-08-16 ENCOUNTER — Other Ambulatory Visit: Payer: Self-pay | Admitting: Physician Assistant

## 2023-08-16 VITALS — BP 122/68 | HR 68 | Temp 98.1°F | Resp 18 | Wt 168.8 lb

## 2023-08-16 DIAGNOSIS — C7800 Secondary malignant neoplasm of unspecified lung: Secondary | ICD-10-CM

## 2023-08-16 DIAGNOSIS — Z95828 Presence of other vascular implants and grafts: Secondary | ICD-10-CM

## 2023-08-16 DIAGNOSIS — E222 Syndrome of inappropriate secretion of antidiuretic hormone: Secondary | ICD-10-CM | POA: Diagnosis not present

## 2023-08-16 DIAGNOSIS — B351 Tinea unguium: Secondary | ICD-10-CM | POA: Diagnosis not present

## 2023-08-16 DIAGNOSIS — G8929 Other chronic pain: Secondary | ICD-10-CM | POA: Diagnosis not present

## 2023-08-16 DIAGNOSIS — R634 Abnormal weight loss: Secondary | ICD-10-CM | POA: Diagnosis not present

## 2023-08-16 DIAGNOSIS — E871 Hypo-osmolality and hyponatremia: Secondary | ICD-10-CM | POA: Diagnosis not present

## 2023-08-16 DIAGNOSIS — C7910 Secondary malignant neoplasm of unspecified urinary organs: Secondary | ICD-10-CM

## 2023-08-16 DIAGNOSIS — I700 Atherosclerosis of aorta: Secondary | ICD-10-CM | POA: Diagnosis not present

## 2023-08-16 DIAGNOSIS — R400 Somnolence: Secondary | ICD-10-CM | POA: Diagnosis not present

## 2023-08-16 DIAGNOSIS — M549 Dorsalgia, unspecified: Secondary | ICD-10-CM | POA: Diagnosis not present

## 2023-08-16 DIAGNOSIS — R5383 Other fatigue: Secondary | ICD-10-CM | POA: Diagnosis not present

## 2023-08-16 DIAGNOSIS — Z905 Acquired absence of kidney: Secondary | ICD-10-CM | POA: Diagnosis not present

## 2023-08-16 DIAGNOSIS — Z9226 Personal history of immune checkpoint inhibitor therapy: Secondary | ICD-10-CM | POA: Diagnosis not present

## 2023-08-16 DIAGNOSIS — C679 Malignant neoplasm of bladder, unspecified: Secondary | ICD-10-CM | POA: Diagnosis not present

## 2023-08-16 DIAGNOSIS — I252 Old myocardial infarction: Secondary | ICD-10-CM | POA: Diagnosis not present

## 2023-08-16 DIAGNOSIS — R682 Dry mouth, unspecified: Secondary | ICD-10-CM | POA: Diagnosis not present

## 2023-08-16 DIAGNOSIS — Z881 Allergy status to other antibiotic agents status: Secondary | ICD-10-CM | POA: Diagnosis not present

## 2023-08-16 DIAGNOSIS — D174 Benign lipomatous neoplasm of intrathoracic organs: Secondary | ICD-10-CM | POA: Diagnosis not present

## 2023-08-16 DIAGNOSIS — Z885 Allergy status to narcotic agent status: Secondary | ICD-10-CM | POA: Diagnosis not present

## 2023-08-16 DIAGNOSIS — Z5112 Encounter for antineoplastic immunotherapy: Secondary | ICD-10-CM | POA: Diagnosis not present

## 2023-08-16 DIAGNOSIS — Z79899 Other long term (current) drug therapy: Secondary | ICD-10-CM | POA: Diagnosis not present

## 2023-08-16 DIAGNOSIS — Z7902 Long term (current) use of antithrombotics/antiplatelets: Secondary | ICD-10-CM | POA: Diagnosis not present

## 2023-08-16 DIAGNOSIS — M47814 Spondylosis without myelopathy or radiculopathy, thoracic region: Secondary | ICD-10-CM | POA: Diagnosis not present

## 2023-08-16 DIAGNOSIS — I2510 Atherosclerotic heart disease of native coronary artery without angina pectoris: Secondary | ICD-10-CM | POA: Diagnosis not present

## 2023-08-16 LAB — CMP (CANCER CENTER ONLY)
ALT: 8 U/L (ref 0–44)
AST: 12 U/L — ABNORMAL LOW (ref 15–41)
Albumin: 3.6 g/dL (ref 3.5–5.0)
Alkaline Phosphatase: 76 U/L (ref 38–126)
Anion gap: 5 (ref 5–15)
BUN: 14 mg/dL (ref 8–23)
CO2: 27 mmol/L (ref 22–32)
Calcium: 8.9 mg/dL (ref 8.9–10.3)
Chloride: 105 mmol/L (ref 98–111)
Creatinine: 1.23 mg/dL — ABNORMAL HIGH (ref 0.44–1.00)
GFR, Estimated: 46 mL/min — ABNORMAL LOW (ref >60)
Glucose, Bld: 115 mg/dL — ABNORMAL HIGH (ref 70–99)
Potassium: 3.9 mmol/L (ref 3.5–5.1)
Sodium: 137 mmol/L (ref 135–145)
Total Bilirubin: 0.4 mg/dL (ref 0.0–1.2)
Total Protein: 6 g/dL — ABNORMAL LOW (ref 6.5–8.1)

## 2023-08-16 LAB — CBC WITH DIFFERENTIAL (CANCER CENTER ONLY)
Abs Immature Granulocytes: 0.01 10*3/uL (ref 0.00–0.07)
Basophils Absolute: 0 10*3/uL (ref 0.0–0.1)
Basophils Relative: 1 %
Eosinophils Absolute: 0.2 10*3/uL (ref 0.0–0.5)
Eosinophils Relative: 4 %
HCT: 30.2 % — ABNORMAL LOW (ref 36.0–46.0)
Hemoglobin: 9.8 g/dL — ABNORMAL LOW (ref 12.0–15.0)
Immature Granulocytes: 0 %
Lymphocytes Relative: 35 %
Lymphs Abs: 1.9 10*3/uL (ref 0.7–4.0)
MCH: 33.3 pg (ref 26.0–34.0)
MCHC: 32.5 g/dL (ref 30.0–36.0)
MCV: 102.7 fL — ABNORMAL HIGH (ref 80.0–100.0)
Monocytes Absolute: 0.5 10*3/uL (ref 0.1–1.0)
Monocytes Relative: 9 %
Neutro Abs: 2.8 10*3/uL (ref 1.7–7.7)
Neutrophils Relative %: 51 %
Platelet Count: 246 10*3/uL (ref 150–400)
RBC: 2.94 MIL/uL — ABNORMAL LOW (ref 3.87–5.11)
RDW: 15.6 % — ABNORMAL HIGH (ref 11.5–15.5)
WBC Count: 5.4 10*3/uL (ref 4.0–10.5)
nRBC: 0 % (ref 0.0–0.2)

## 2023-08-16 MED ORDER — PROCHLORPERAZINE MALEATE 10 MG PO TABS
10.0000 mg | ORAL_TABLET | Freq: Once | ORAL | Status: AC
  Administered 2023-08-16: 10 mg via ORAL
  Filled 2023-08-16: qty 1

## 2023-08-16 MED ORDER — SODIUM CHLORIDE 0.9% FLUSH
10.0000 mL | Freq: Once | INTRAVENOUS | Status: AC
  Administered 2023-08-16: 10 mL

## 2023-08-16 MED ORDER — SODIUM CHLORIDE 1 G PO TABS: 2.0000 g | ORAL_TABLET | Freq: Two times a day (BID) | ORAL | 0 refills | Status: DC

## 2023-08-16 MED ORDER — SODIUM CHLORIDE 0.9 % IV SOLN
1.2500 mg/kg | Freq: Once | INTRAVENOUS | Status: AC
  Administered 2023-08-16: 100 mg via INTRAVENOUS
  Filled 2023-08-16: qty 6

## 2023-08-16 MED ORDER — SODIUM CHLORIDE 0.9 % IV SOLN: INTRAVENOUS | Status: DC

## 2023-08-16 MED ORDER — LIDOCAINE-PRILOCAINE 2.5-2.5 % EX CREA: 1.0000 | TOPICAL_CREAM | CUTANEOUS | 2 refills | Status: DC | PRN

## 2023-08-16 NOTE — Patient Instructions (Signed)
[facility] - A DEPT OF [facility]  Discharge Instructions: Thank you for choosing [facility] to provide your oncology and hematology care.   If you have a lab appointment with the Cancer Center, please go directly to the Cancer Center and check in at the registration area.   Wear comfortable clothing and clothing appropriate for easy access to any Portacath or PICC line.   We strive to give you quality time with your provider. You may need to reschedule your appointment if you arrive late (15 or more minutes).  Arriving late affects you and other patients whose appointments are after yours.  Also, if you miss three or more appointments without notifying the office, you may be dismissed from the clinic at the provider's discretion.      For prescription refill requests, have your pharmacy contact our office and allow 72 hours for refills to be completed.    Today you received the following chemotherapy and/or immunotherapy agents Padcev   To help prevent nausea and vomiting after your treatment, we encourage you to take your nausea medication as directed.  BELOW ARE SYMPTOMS THAT SHOULD BE REPORTED IMMEDIATELY: *FEVER GREATER THAN 100.4 F (38 C) OR HIGHER *CHILLS OR SWEATING *NAUSEA AND VOMITING THAT IS NOT CONTROLLED WITH YOUR NAUSEA MEDICATION *UNUSUAL SHORTNESS OF BREATH *UNUSUAL BRUISING OR BLEEDING *URINARY PROBLEMS (pain or burning when urinating, or frequent urination) *BOWEL PROBLEMS (unusual diarrhea, constipation, pain near the anus) TENDERNESS IN MOUTH AND THROAT WITH OR WITHOUT PRESENCE OF ULCERS (sore throat, sores in mouth, or a toothache) UNUSUAL RASH, SWELLING OR PAIN  UNUSUAL VAGINAL DISCHARGE OR ITCHING   Items with * indicate a potential emergency and should be followed up as soon as possible or go to the Emergency Department if any problems should occur.  Please show the CHEMOTHERAPY ALERT CARD or IMMUNOTHERAPY ALERT  CARD at check-in to the Emergency Department and triage nurse.  Should you have questions after your visit or need to cancel or reschedule your appointment, please contact [facility] - A DEPT OF [facility]  Dept: [phone]  and follow the prompts.  Office hours are 8:00 a.m. to 4:30 p.m. Monday - Friday. Please note that voicemails left after 4:00 p.m. may not be returned until the following business day.  We are closed weekends and major holidays. You have access to a nurse at all times for urgent questions. Please call the main number to the clinic Dept: [phone] and follow the prompts.   For any non-urgent questions, you may also contact your provider using MyChart. We now offer e-Visits for anyone 63 and older to request care online for non-urgent symptoms. For details visit [email or web address].   Also download the MyChart app! Go to the app store, search "MyChart", open the app, select [place], and log in with your MyChart username and password.   Enfortumab Vedotin Injection What is this medication? ENFORTUMAB VEDOTIN (en FORT ue mab ve DOE tin) treats bladder cancer and kidney cancer. It works by blocking a protein that causes cancer cells to grow and multiply. This helps to slow or stop the spread of cancer cells. This medicine may be used for other purposes; ask your health care provider or pharmacist if you have questions. COMMON BRAND NAME(S): PADCEV What should I tell my care team before I take this medication? They need to know if you have any of these conditions: Diabetes  Eye disease Liver disease Lung disease Tingling of the fingers or toes or other nerve disorder Vision problems An unusual or allergic reaction to enfortumab vedotin, other medications, foods, dyes, or preservatives Pregnant or trying to get pregnant Breast-feeding How should I use this medication? This medication is injected into a vein. It is given by your  care team in a hospital or clinic setting. Talk to your care team about the use of this medication in children. Special care may be needed. Overdosage: If you think you have taken too much of this medicine contact a poison control center or emergency room at once. NOTE: This medicine is only for you. Do not share this medicine with others. What if I miss a dose? Keep appointments for follow-up doses. It is important not to miss your dose. Call your care team if you are unable to keep an appointment. What may interact with this medication? This medication may affect how other medications work, and other medications may affect how this medication works. Talk with your care team about all of the medications you take. They may suggest changes to your treatment plan to lower the risk of side effects and to make sure your medications work as intended. This list may not describe all possible interactions. Give your health care provider a list of all the medicines, herbs, non-prescription drugs, or dietary supplements you use. Also tell them if you smoke, drink alcohol, or use illegal drugs. Some items may interact with your medicine. What should I watch for while using this medication? Your condition will be monitored carefully while you are receiving this medication. This medication may make you feel generally unwell. This is not uncommon as chemotherapy can affect healthy cells as well as cancer cells. Report any side effects. Continue your course of treatment even though you feel ill unless your care team tells you to stop. This medication may increase blood sugar. The risk may be higher in patients who already have diabetes. Ask your care team what you can do to lower your risk of diabetes while taking this medication. This medication can cause a serious condition in which there is too much acid in your blood. If you develop nausea, vomiting, stomach pain, unusual tiredness, or trouble breathing, stop  taking this medication and call your care team right away. If possible, use a ketone dipstick to check for ketones in your urine. This medication may cause dry eyes and blurred vision. If you wear contact lenses, you may feel some discomfort. Lubricating eye drops may help. See your care team if the problem does not go away or is severe. Tell your care team right away if you have any change in your eyesight. This medication may increase your risk of getting an infection. Call your care team for advice if you get a fever, chills, sore throat, or other symptoms of a cold or flu. Do not treat yourself. Try to avoid being around people who are sick. Avoid taking medications that contain aspirin, acetaminophen, ibuprofen, naproxen, or ketoprofen unless instructed by your care team. These medications may hide a fever. This medication may cause serious skin reactions. They can happen weeks to months after starting the medication. Contact your care team right away if you notice fevers or flu-like symptoms with a rash. The rash may be red or purple and then turn into blisters or peeling of the skin. You may also notice a red rash with swelling of the face, lips or lymph nodes in your neck  or under your arms. Talk to your care team if you or your partner may be pregnant. Serious birth defects can occur if you take this medication during pregnancy and for 2 months after the last dose. You will need a negative pregnancy test before starting this medication. Contraception is recommended while taking this medication and for 2 months after the last dose. Your care team can help you find the option that works for you. If your partner can get pregnant, use a condom during sex while taking this medication and for 4 months after the last dose. Do not breastfeed while taking this medicine or for at least 3 weeks after the last dose. This medication may cause infertility. Talk to your care team if you are concerned about your  fertility. What side effects may I notice from receiving this medication? Side effects that you should report to your care team as soon as possible: Allergic reactions--skin rash, itching, hives, swelling of the face, lips, tongue, or throat Dry cough, shortness of breath or trouble breathing Eye pain, redness, irritation, or discharge with blurry or decreased vision High blood sugar (hyperglycemia)--increased thirst or amount of urine, unusual weakness or fatigue, blurry vision Painful swelling, warmth, or redness of the skin, blisters or sores at the infusion site Pain, tingling, or numbness in the hands or feet Redness, blistering, peeling, or loosening of the skin, including inside the mouth Unusual bruising or bleeding Side effects that usually do not require medical attention (report these to your care team if they continue or are bothersome): Change in taste Diarrhea Dry eyes Fatigue Hair loss Loss of appetite This list may not describe all possible side effects. Call your doctor for medical advice about side effects. You may report side effects to FDA at 1-800-FDA-1088. Where should I keep my medication? This medication is given in a hospital or clinic. It will not be stored at home. NOTE: This sheet is a summary. It may not cover all possible information. If you have questions about this medicine, talk to your doctor, pharmacist, or health care provider.  2024 [employer]/Gold Standard (2021-10-24 00:00:00)

## 2023-08-18 ENCOUNTER — Other Ambulatory Visit: Payer: Self-pay

## 2023-08-19 ENCOUNTER — Telehealth: Payer: Self-pay

## 2023-08-19 NOTE — Telephone Encounter (Signed)
-----   Message from Nurse Barbara Cower E sent at 08/16/2023 11:59 AM EST ----- Regarding: first time treatment call back- padcev-mohamed Patient received first time padcev today. She is followed by Dr. Arbutus Ped. She did great with no issues

## 2023-08-19 NOTE — Telephone Encounter (Signed)
 Morgan Torres states that she is doing fine. She is eating, drinking, and urinating well. She knows to call the office at [phone] if  she has any questions or concerns.

## 2023-08-20 ENCOUNTER — Other Ambulatory Visit: Payer: Self-pay

## 2023-08-20 DIAGNOSIS — E222 Syndrome of inappropriate secretion of antidiuretic hormone: Secondary | ICD-10-CM | POA: Diagnosis not present

## 2023-08-20 DIAGNOSIS — D630 Anemia in neoplastic disease: Secondary | ICD-10-CM | POA: Diagnosis not present

## 2023-08-20 DIAGNOSIS — C7800 Secondary malignant neoplasm of unspecified lung: Secondary | ICD-10-CM | POA: Diagnosis not present

## 2023-08-20 DIAGNOSIS — C7910 Secondary malignant neoplasm of unspecified urinary organs: Secondary | ICD-10-CM | POA: Diagnosis not present

## 2023-08-20 DIAGNOSIS — J440 Chronic obstructive pulmonary disease with acute lower respiratory infection: Secondary | ICD-10-CM | POA: Diagnosis not present

## 2023-08-20 DIAGNOSIS — C679 Malignant neoplasm of bladder, unspecified: Secondary | ICD-10-CM | POA: Diagnosis not present

## 2023-08-20 DIAGNOSIS — I739 Peripheral vascular disease, unspecified: Secondary | ICD-10-CM | POA: Diagnosis not present

## 2023-08-20 NOTE — Progress Notes (Unsigned)
 [facility] OFFICE PROGRESS NOTE  [name], MD [address] [place] [address]  DIAGNOSIS: Metastatic urothelial carcinoma that was initially diagnosed as stage II (T2, N0, M0) in September 2022 status post right nephroureterectomy as well as intravesical treatment and resection of superficial tumor in the bladder several times and March 2023 as well as July 2023.  The patient was found to have enlarging and new pulmonary nodules consistent with metastatic disease in November 2023.   PRIOR THERAPY: 1) Right nephroureterectomy as well as intravesical treatment and resection of superficial tumor in the bladder several times and March 2023 as well as July 2023.  2) Palliative systemic chemotherapy with carboplatin for AUC of 5 on day 1 and gemcitabine 1000 mg/M2 on days 1 and 8 every 3 weeks. First cycle June 21, 2022. Status post 6 cycles. Her dose of carboplatin was reduced to AUC of 4 and gemcitabine to 800 mg/m2 starting from cycle #5 due to cytopenias. 3) Maintenance immunotherapy with Avelumab 800 mg IV every 2 weeks. First dose on 11/15/22. Status post 6 cycle of treatment. 4) Erdafitinib (balversa) 8 mg p.o daily. First dose 02/22/23. Status post 4 months of treatment. Discontinued in February 2025 due to disease progression.   CURRENT THERAPY: enfortumab vedotin infusion therapy: once a week for three weeks, followed by one week off, repeated every four weeks. First dose was on 08/08/2023  INTERVAL HISTORY: Morgan Torres 73 y.o. female returns to the clinic today for a follow-up visit.  The patient was last seen by Dr. Arbutus Ped on 08/08/2023.  The patient had just been discharged from the hospital a few weeks prior due to hyponatremia for which she is currently on salt tablets.  The patient had repeat imaging studies performed around the time of her last appointment which showed disease progression.  Therefore, Dr. Arbutus Ped discontinued her treatment with Erdafitinib  and started her on enfortumab vedotin on 08/08/23. She tolerated this well. She states it was "not bad" and she did not show any appreciable side effects.   At baseline, she has poor energy. However, she is doing PT and OT. She believes this is helping.  Her energy is somewhat better but she still feels like she is "draining".  She is supposed to see member the nutritionist team today due to decreased appetite.  She also reports dry mouth.  She lost weight since she was last seen.  She drinks Ensure about twice a day.  Denies any fever, chills, or night sweats.  Denies any chest pain or hemoptysis.  She denies any changes with her dyspnea on exertion.  She states overall her breathing is "fine".  Denies any cough.  Denies any nausea, vomiting, diarrhea, or constipation.  She last got an eye exam approximately 2 weeks ago got eyedrops for allergies.  She denies any peripheral neuropathy but may have numbness and tingling that is positional if her extremity is bent in a certain way.  She has nail fungus.  She is supposed to see dermatology for a biopsy in her left axillary region.  She has been using topical lotion for the fungal infection.  She is here today for evaluation repeat blood work before undergoing day 8 cycle #1 tomorrow.     MEDICAL HISTORY: Past Medical History:  Diagnosis Date   Anxiety    Arthritis    hip, lumbar spine    Asthma    seasonal allergies    Bronchitis    Hx:  of   Chronic kidney disease    COPD (chronic obstructive pulmonary disease) (HCC)    Coronary artery disease    Depression    Fibromyalgia    Hypertension    Lumbar herniated disc    Myocardial infarction (HCC) 06/25/2012   followed by Dr. Rennis Golden, treated medically, no stents   Peripheral arterial disease (HCC)    of right foot   Pneumonia    Hx: of yrs ago   Sciatica    Sleep apnea    Small bowel obstruction (HCC)    several yrs ago    ALLERGIES:  is allergic to diclofenac, ciprofloxacin, codeine,  and hydrocodone.  MEDICATIONS:  Current Outpatient Medications  Medication Sig Dispense Refill   acetaminophen (TYLENOL) 500 MG tablet Take 1 tablet (500 mg total) by mouth every 6 (six) hours as needed for mild pain. 30 tablet 0   albuterol (PROVENTIL HFA;VENTOLIN HFA) 108 (90 Base) MCG/ACT inhaler Inhale 2 puffs into the lungs every 6 (six) hours as needed for wheezing or shortness of breath. 1 Inhaler 0   aspirin EC 81 MG tablet Take 81 mg by mouth daily. Swallow whole.     Carboxymethylcell-Glycerin PF 0.5-0.9 % SOLN Place 2 drops into both eyes 4 (four) times daily. 1 each 11   cilostazol (PLETAL) 50 MG tablet Take 50 mg by mouth 2 (two) times daily.     clotrimazole (LOTRIMIN) 1 % external solution Apply topically 2 (two) times daily. 30 mL 0   EPINEPHrine (EPIPEN 2-PAK) 0.3 mg/0.3 mL DEVI Inject 0.3 mLs (0.3 mg total) into the muscle once as needed (for severe allergic reaction). CAll 911 immediately if you have to use this medicine 1 Device 1   guaiFENesin (MUCINEX) 600 MG 12 hr tablet Take 2 tablets (1,200 mg total) by mouth 2 (two) times daily. 120 tablet 0   levothyroxine (SYNTHROID) 88 MCG tablet Take 1 tablet (88 mcg total) by mouth daily before breakfast. 90 tablet 2   lidocaine-prilocaine (EMLA) cream Apply 1 Application topically as needed. 30 g 2   LORazepam (ATIVAN) 0.5 MG tablet Take 0.5 mg by mouth daily.     nicotine (NICODERM CQ - DOSED IN MG/24 HOURS) 21 mg/24hr patch Place 21 mg onto the skin daily.     nicotine polacrilex (NICORETTE) 4 MG gum Take 4 mg by mouth as needed for smoking cessation.     nitroGLYCERIN (NITROSTAT) 0.4 MG SL tablet Place 1 tablet (0.4 mg total) under the tongue every 5 (five) minutes x 3 doses as needed for chest pain. 25 tablet 2   ondansetron (ZOFRAN) 8 MG tablet Take 1 tablet (8 mg total) by mouth every 8 (eight) hours as needed for nausea or vomiting. 30 tablet 1   oxybutynin (DITROPAN) 5 MG tablet Take 1 tablet (5 mg total) by mouth every 8  (eight) hours as needed for bladder spasms. 30 tablet 1   polyethylene glycol (MIRALAX / GLYCOLAX) 17 g packet Take 17 g by mouth as needed.     sodium chloride 1 g tablet Take 2 tablets (2 g total) by mouth 2 (two) times daily with a meal. 60 tablet 0   temazepam (RESTORIL) 15 MG capsule Take 1 capsule (15 mg total) by mouth at bedtime as needed for sleep. 30 capsule 0   traMADol (ULTRAM) 50 MG tablet Take 50 mg by mouth every 6 (six) hours as needed for moderate pain (pain score 4-6). Not taking     No current facility-administered medications for  this visit.    SURGICAL HISTORY:  Past Surgical History:  Procedure Laterality Date   ABDOMINAL SURGERY     resection of small intestine   ANTERIOR CRUCIATE LIGAMENT REPAIR Bilateral    BACK SURGERY     fusion of lumbar   CARDIAC CATHETERIZATION  06/25/2012   COLON SURGERY     resection for bowel - for obstruction  6 to 7 yrs ago per pt on 01-05-2022   COLONOSCOPY     Hx; of   DILATION AND CURETTAGE OF UTERUS     EYE SURGERY Bilateral    cataracts removed   HERNIA REPAIR  02/23/1969   inguinal hernia    IR IMAGING GUIDED PORT INSERTION  06/07/2022   LEFT HEART CATHETERIZATION WITH CORONARY ANGIOGRAM N/A 03/30/2013   Procedure: LEFT HEART CATHETERIZATION WITH CORONARY ANGIOGRAM;  Surgeon: Marykay Lex, MD;  Location: [facility] CATH LAB;  Service: Cardiovascular;  Laterality: N/A;   left thumb surgery     yrs ago   ROBOT ASSITED LAPAROSCOPIC NEPHROURETERECTOMY Left 03/09/2021   Procedure: XI ROBOT ASSITED LAPAROSCOPIC NEPHROURETERECTOMY/ CYSTOSCOPY WITH LEFT URETEROSCOPY WITH TRANSURETHRAL RESECTION OF URETERAL ORIFICE;  Surgeon: Rene Paci, MD;  Location: [facility];  Service: Urology;  Laterality: Left;   SALIVARY GLAND SURGERY Left    approached from inside mouth & side of neck 1970-1980   TONSILLECTOMY     as child   TOTAL HIP ARTHROPLASTY Left 05/28/2014   Procedure: LEFT TOTAL HIP ARTHROPLASTY;  Surgeon: Thera Flake.,  MD;  Location: [facility] OR;  Service: Orthopedics;  Laterality: Left;   TRANSURETHRAL RESECTION OF BLADDER TUMOR N/A 08/23/2021   Procedure: TRANSURETHRAL RESECTION OF BLADDER TUMOR (TURBT) WITH CYSTOSCOPY/ POSTOPERATIVE INSTILLATION OF GEMCITABINE/retrograde pyelogram and stent placement (right);  Surgeon: Rene Paci, MD;  Location: [facility];  Service: Urology;  Laterality: N/A;   TRANSURETHRAL RESECTION OF BLADDER TUMOR N/A 01/10/2022   Procedure: TRANSURETHRAL RESECTION OF BLADDER TUMOR (TURBT) WITH CYSTOSCOPY / GEMCITABINE INSTILLATION post-operatively;  Surgeon: Rene Paci, MD;  Location: [facility];  Service: Urology;  Laterality: N/A;  ONLY NEEDS 30 MIN    REVIEW OF SYSTEMS:   Review of Systems  Constitutional: Positive for stable/slightly improved fatigue and generalized Negative for chills, fever and unexpected weight change.  HENT:  Negative for mouth sores, nosebleeds, sore throat and trouble swallowing.   Eyes: Negative for eye problems and icterus.  Respiratory: Negative for cough, hemoptysis, shortness of breath and wheezing.   Cardiovascular: Negative for chest pain and leg swelling.  Gastrointestinal: Negative for abdominal pain, constipation, diarrhea, nausea and vomiting.  Genitourinary: Negative for bladder incontinence, difficulty urinating, dysuria, frequency and hematuria.   Musculoskeletal: Positive for chronic back pain. Negative for gait problem, neck pain and neck stiffness.  Skin: Negative for itching and rash.  Neurological: Negative for dizziness, extremity weakness, gait problem, headaches, light-headedness and seizures.  Hematological: Negative for adenopathy. Does not bruise/bleed easily.  Psychiatric/Behavioral: Negative for confusion, depression and sleep disturbance. The patient is not nervous/anxious.     PHYSICAL EXAMINATION:  Blood pressure (!) 145/78, pulse 81, temperature (!) 97.5 F (36.4 C),  temperature source Temporal, resp. rate 17, weight 163 lb 3.2 oz (74 kg), SpO2 98%.  ECOG PERFORMANCE STATUS: 2  Physical Exam  Constitutional: Oriented to person, place, and time and chronically ill appearing, and in no distress.   HENT:  Head: Normocephalic and atraumatic.  Mouth/Throat: Oropharynx is clear and moist. No oropharyngeal exudate.  Eyes: Conjunctivae  are normal. Right eye exhibits no discharge. Left eye exhibits no discharge. No scleral icterus.  Neck: Normal range of motion. Neck supple.  Cardiovascular: Normal rate, regular rhythm, normal heart sounds and intact distal pulses.   Pulmonary/Chest: Effort normal and breath sounds normal. No respiratory distress. No wheezes. No rales.  Abdominal: Soft. Bowel sounds are normal. Exhibits no distension and no mass. There is no tenderness.  Musculoskeletal: Normal range of motion. Mild bilateral lower extremity edema.  Lymphadenopathy:    No cervical adenopathy.  Neurological: Alert and oriented to person, place, and time. Exhibits muscle wasting. Examined in the wheelchair.  Skin: Skin is warm and dry. No rash noted. Not diaphoretic. No erythema. No pallor.  Psychiatric: Mood, memory and judgment normal.  Vitals reviewed.  LABORATORY DATA: Lab Results  Component Value Date   WBC 5.4 08/22/2023   HGB 10.3 (L) 08/22/2023   HCT 31.1 (L) 08/22/2023   MCV 99.0 08/22/2023   PLT 242 08/22/2023      Chemistry      Component Value Date/Time   NA 137 08/16/2023 0938   K 3.9 08/16/2023 0938   CL 105 08/16/2023 0938   CO2 27 08/16/2023 0938   BUN 14 08/16/2023 0938   CREATININE 1.23 (H) 08/16/2023 0938      Component Value Date/Time   CALCIUM 8.9 08/16/2023 0938   ALKPHOS 76 08/16/2023 0938   AST 12 (L) 08/16/2023 0938   ALT 8 08/16/2023 0938   BILITOT 0.4 08/16/2023 0938       RADIOGRAPHIC STUDIES:  NM PET Image Restage (PS) Skull Base to Thigh (F-18 FDG) Result Date: 08/07/2023 CLINICAL DATA:  Subsequent  treatment strategy for metastatic urothelial carcinoma. EXAM: NUCLEAR MEDICINE PET SKULL BASE TO THIGH TECHNIQUE: 8.59 mCi F-18 FDG was injected intravenously. Full-ring PET imaging was performed from the skull base to thigh after the radiotracer. CT data was obtained and used for attenuation correction and anatomic localization. Fasting blood glucose: 85 mg/dl COMPARISON:  CT chest, abdomen and pelvis 04/22/2023 FINDINGS: Mediastinal blood pool activity: SUV max 3.06 Liver activity: SUV max NA NECK: Mildly tracer avid right level 4 lymph node measures 5 mm with SUV max of 3.57, image 40/4. unchanged from 04/22/2023. Incidental CT findings: None. CHEST: Bilateral tracer avid mediastinal lymph nodes. Index nodes include: -high right paratracheal lymph node measures 7 mm with SUV max of 12.30, image 49/4. Previously 3 mm. -Low right paratracheal lymph node measures 1.7 cm with SUV max of 17.13, image 69/4. Previously 0.5 cm. -Subcarinal lymph node measures 1.6 cm with SUV max of 20.89, image 77/4. Previously 0.6 cm. -Left pre-vascular lymph node measures 1.4 cm with SUV max of 16.68, image 66/4. Previously 0.7 cm. -Within the superior segment of the left lower lobe there is a tracer avid nodule which measures 1.7 cm with SUV max of 9.75, image 67/4. Previously 1.4 cm Incidental CT findings: Aortic atherosclerosis and coronary artery calcifications. ABDOMEN/PELVIS: No abnormal tracer uptake identified within the liver, pancreas, spleen. Mild increased tracer uptake within the left periaortic region has an SUV max of 4.53, this corresponds to a 1.3 cm left periaortic lymph node, image 115/4. Previously 0.6 cm. Incidental CT findings: Aortic atherosclerosis. Status post left nephrectomy. No abnormal tracer avid soft tissue nodules within the nephrectomy bed. SKELETON: No focal hypermetabolic activity to suggest skeletal metastasis. Incidental CT findings: None. IMPRESSION: 1. Tracer avid mediastinal lymph nodes are  increased in size compared with 04/22/2023 compatible with nodal metastasis. 2. Interval increase in size  and tracer uptake associated with left periaortic lymph node compatible with nodal metastasis. 3. Tracer avid nodule within the superior segment of left lower lobe is increased in size compatible with pulmonary metastasis. Hello Dr. Stefanie Libel no are Ace 4. Status post left nephrectomy. No abnormal tracer avid soft tissue nodules within the nephrectomy bed. 5.  Aortic Atherosclerosis (ICD10-I70.0). Electronically Signed   By: Signa Kell M.D.   On: 08/07/2023 17:04     ASSESSMENT/PLAN:  his is a very pleasant 73 year old African-American female with metastatic urothelial carcinoma.  She was initially diagnosed as a stage II (T2, N0, M0) in September 2022.    She is status post right nephroureterectomy as well as  intravesical treatment and resection of superficial tumor in the bladder several times and March 2023 as well as July 2023.  The patient was found to have enlarging and new pulmonary nodules consistent with metastatic disease in November 2023.   Dr. Arbutus Ped sent her tissue for foundation 1 and PD-L1 expression. She does not have any actionable mutations   She completed 6 cycles of systemic chemotherapy with carboplatin for an AUC of 5 on day 1 and gemcitabine 1000 milligrams per meter squared on days 1 and 8 IV every 3 weeks with Neulasta support.  Her dose was reduced to carboplatin for an AUC of 4 and gemcitabine 800 mg/m due to cytopenias transfusion support.   Dr. Arbutus Ped recommended maintenance immunotherapy with Avelumab 2 mg IV every 2 weeks.  She is status post 6 cycles of treatment and she tolerated it well without any concerning adverse side.   She had repeat imaging in August 2024 showing interval enlargement of bilateral pulmonary nodules consistent with progressive metastatic disease.    She started on EGFR therapy with Balversa 8 milligrams p.o. daily. She started this on  02/22/23. She had been on this for 4 months.She recently had disease progression and this was discontinued.   Therefore, Dr. Arbutus Ped started her on enfortumab vedotin once a week for three weeks, followed by one week off, repeated every four weeks.  She started this on 08/08/2023.   Labs were reviewed.  Recommend that she proceed with day 8 cycle 1 today as scheduled.  We will see her back for follow-up visit with day 1 cycle #2.  Will continue taking her salt tablets. She just had these refilled. We discussed reducing the dose to 1 g p.o. daily and adjusting based on her weekly labs. Her prescription says 2 g BID but she knows to take this 1 g once daily.   She will continue with eye exams every 3 months.   She is scheduled to see the nutritionist today. Concerned she would not be a good candidate for marinol due to her fatigue. Due to history of SIADH she would not be good candidate for remeron. She is not interested in megace due to side effect profile and small risk of clots. She will discuss strategies with the nutritionist today. I encouraged her to perform salt water rinses.   She I ok to get her biopsy of the axillary region but we ask that she get this on her off week of treatment.   She will reschedule the appointment with the dermatologist and discuss the nail fungus for any additional treatment options.   The patient was advised to call immediately if she has any concerning symptoms in the interval. The patient voices understanding of current disease status and treatment options and is in agreement with the current care  plan. All questions were answered. The patient knows to call the clinic with any problems, questions or concerns. We can certainly see the patient much sooner if necessary         No orders of the defined types were placed in this encounter. The total time spent in the appointment was 20-29 minutes  Mikaili Flippin L Jordon Bourquin, PA-C 08/22/23

## 2023-08-21 ENCOUNTER — Other Ambulatory Visit: Payer: Self-pay | Admitting: Physician Assistant

## 2023-08-21 DIAGNOSIS — E222 Syndrome of inappropriate secretion of antidiuretic hormone: Secondary | ICD-10-CM | POA: Diagnosis not present

## 2023-08-21 DIAGNOSIS — C679 Malignant neoplasm of bladder, unspecified: Secondary | ICD-10-CM | POA: Diagnosis not present

## 2023-08-21 DIAGNOSIS — J440 Chronic obstructive pulmonary disease with acute lower respiratory infection: Secondary | ICD-10-CM | POA: Diagnosis not present

## 2023-08-21 DIAGNOSIS — D630 Anemia in neoplastic disease: Secondary | ICD-10-CM | POA: Diagnosis not present

## 2023-08-21 DIAGNOSIS — C7800 Secondary malignant neoplasm of unspecified lung: Secondary | ICD-10-CM | POA: Diagnosis not present

## 2023-08-21 DIAGNOSIS — D6481 Anemia due to antineoplastic chemotherapy: Secondary | ICD-10-CM

## 2023-08-21 DIAGNOSIS — C7910 Secondary malignant neoplasm of unspecified urinary organs: Secondary | ICD-10-CM | POA: Diagnosis not present

## 2023-08-21 DIAGNOSIS — I739 Peripheral vascular disease, unspecified: Secondary | ICD-10-CM | POA: Diagnosis not present

## 2023-08-22 ENCOUNTER — Inpatient Hospital Stay: Payer: Medicare Other

## 2023-08-22 ENCOUNTER — Encounter: Payer: Self-pay | Admitting: Internal Medicine

## 2023-08-22 ENCOUNTER — Other Ambulatory Visit: Payer: Medicare Other

## 2023-08-22 ENCOUNTER — Inpatient Hospital Stay (HOSPITAL_BASED_OUTPATIENT_CLINIC_OR_DEPARTMENT_OTHER): Payer: Medicare Other | Admitting: Physician Assistant

## 2023-08-22 ENCOUNTER — Inpatient Hospital Stay: Payer: Medicare Other | Admitting: Dietician

## 2023-08-22 VITALS — BP 145/78 | HR 81 | Temp 97.5°F | Resp 17 | Wt 163.2 lb

## 2023-08-22 VITALS — BP 129/56 | HR 78 | Temp 97.6°F | Resp 20

## 2023-08-22 DIAGNOSIS — Z95828 Presence of other vascular implants and grafts: Secondary | ICD-10-CM

## 2023-08-22 DIAGNOSIS — C7910 Secondary malignant neoplasm of unspecified urinary organs: Secondary | ICD-10-CM

## 2023-08-22 DIAGNOSIS — R400 Somnolence: Secondary | ICD-10-CM | POA: Diagnosis not present

## 2023-08-22 DIAGNOSIS — I252 Old myocardial infarction: Secondary | ICD-10-CM | POA: Diagnosis not present

## 2023-08-22 DIAGNOSIS — T451X5A Adverse effect of antineoplastic and immunosuppressive drugs, initial encounter: Secondary | ICD-10-CM

## 2023-08-22 DIAGNOSIS — Z905 Acquired absence of kidney: Secondary | ICD-10-CM | POA: Diagnosis not present

## 2023-08-22 DIAGNOSIS — C7800 Secondary malignant neoplasm of unspecified lung: Secondary | ICD-10-CM | POA: Diagnosis not present

## 2023-08-22 DIAGNOSIS — C679 Malignant neoplasm of bladder, unspecified: Secondary | ICD-10-CM

## 2023-08-22 DIAGNOSIS — Z5111 Encounter for antineoplastic chemotherapy: Secondary | ICD-10-CM | POA: Diagnosis not present

## 2023-08-22 DIAGNOSIS — Z5112 Encounter for antineoplastic immunotherapy: Secondary | ICD-10-CM | POA: Diagnosis not present

## 2023-08-22 DIAGNOSIS — I2510 Atherosclerotic heart disease of native coronary artery without angina pectoris: Secondary | ICD-10-CM | POA: Diagnosis not present

## 2023-08-22 DIAGNOSIS — M47814 Spondylosis without myelopathy or radiculopathy, thoracic region: Secondary | ICD-10-CM | POA: Diagnosis not present

## 2023-08-22 DIAGNOSIS — D174 Benign lipomatous neoplasm of intrathoracic organs: Secondary | ICD-10-CM | POA: Diagnosis not present

## 2023-08-22 DIAGNOSIS — Z79899 Other long term (current) drug therapy: Secondary | ICD-10-CM | POA: Diagnosis not present

## 2023-08-22 DIAGNOSIS — B351 Tinea unguium: Secondary | ICD-10-CM | POA: Diagnosis not present

## 2023-08-22 DIAGNOSIS — M549 Dorsalgia, unspecified: Secondary | ICD-10-CM | POA: Diagnosis not present

## 2023-08-22 DIAGNOSIS — E222 Syndrome of inappropriate secretion of antidiuretic hormone: Secondary | ICD-10-CM | POA: Diagnosis not present

## 2023-08-22 DIAGNOSIS — E871 Hypo-osmolality and hyponatremia: Secondary | ICD-10-CM | POA: Diagnosis not present

## 2023-08-22 DIAGNOSIS — I700 Atherosclerosis of aorta: Secondary | ICD-10-CM | POA: Diagnosis not present

## 2023-08-22 DIAGNOSIS — Z881 Allergy status to other antibiotic agents status: Secondary | ICD-10-CM | POA: Diagnosis not present

## 2023-08-22 DIAGNOSIS — Z885 Allergy status to narcotic agent status: Secondary | ICD-10-CM | POA: Diagnosis not present

## 2023-08-22 DIAGNOSIS — R5383 Other fatigue: Secondary | ICD-10-CM | POA: Diagnosis not present

## 2023-08-22 DIAGNOSIS — R682 Dry mouth, unspecified: Secondary | ICD-10-CM | POA: Diagnosis not present

## 2023-08-22 DIAGNOSIS — Z7902 Long term (current) use of antithrombotics/antiplatelets: Secondary | ICD-10-CM | POA: Diagnosis not present

## 2023-08-22 DIAGNOSIS — R634 Abnormal weight loss: Secondary | ICD-10-CM | POA: Diagnosis not present

## 2023-08-22 DIAGNOSIS — Z9226 Personal history of immune checkpoint inhibitor therapy: Secondary | ICD-10-CM | POA: Diagnosis not present

## 2023-08-22 DIAGNOSIS — G8929 Other chronic pain: Secondary | ICD-10-CM | POA: Diagnosis not present

## 2023-08-22 LAB — CBC WITH DIFFERENTIAL (CANCER CENTER ONLY)
Abs Immature Granulocytes: 0.01 10*3/uL (ref 0.00–0.07)
Basophils Absolute: 0 10*3/uL (ref 0.0–0.1)
Basophils Relative: 1 %
Eosinophils Absolute: 0.3 10*3/uL (ref 0.0–0.5)
Eosinophils Relative: 5 %
HCT: 31.1 % — ABNORMAL LOW (ref 36.0–46.0)
Hemoglobin: 10.3 g/dL — ABNORMAL LOW (ref 12.0–15.0)
Immature Granulocytes: 0 %
Lymphocytes Relative: 34 %
Lymphs Abs: 1.8 10*3/uL (ref 0.7–4.0)
MCH: 32.8 pg (ref 26.0–34.0)
MCHC: 33.1 g/dL (ref 30.0–36.0)
MCV: 99 fL (ref 80.0–100.0)
Monocytes Absolute: 0.5 10*3/uL (ref 0.1–1.0)
Monocytes Relative: 10 %
Neutro Abs: 2.7 10*3/uL (ref 1.7–7.7)
Neutrophils Relative %: 50 %
Platelet Count: 242 10*3/uL (ref 150–400)
RBC: 3.14 MIL/uL — ABNORMAL LOW (ref 3.87–5.11)
RDW: 14.6 % (ref 11.5–15.5)
WBC Count: 5.4 10*3/uL (ref 4.0–10.5)
nRBC: 0 % (ref 0.0–0.2)

## 2023-08-22 LAB — CMP (CANCER CENTER ONLY)
ALT: 8 U/L (ref 0–44)
AST: 16 U/L (ref 15–41)
Albumin: 3.7 g/dL (ref 3.5–5.0)
Alkaline Phosphatase: 80 U/L (ref 38–126)
Anion gap: 8 (ref 5–15)
BUN: 21 mg/dL (ref 8–23)
CO2: 26 mmol/L (ref 22–32)
Calcium: 9.1 mg/dL (ref 8.9–10.3)
Chloride: 104 mmol/L (ref 98–111)
Creatinine: 1.38 mg/dL — ABNORMAL HIGH (ref 0.44–1.00)
GFR, Estimated: 40 mL/min — ABNORMAL LOW (ref >60)
Glucose, Bld: 134 mg/dL — ABNORMAL HIGH (ref 70–99)
Potassium: 4 mmol/L (ref 3.5–5.1)
Sodium: 138 mmol/L (ref 135–145)
Total Bilirubin: 0.3 mg/dL (ref 0.0–1.2)
Total Protein: 6.5 g/dL (ref 6.5–8.1)

## 2023-08-22 LAB — SAMPLE TO BLOOD BANK

## 2023-08-22 MED ORDER — SODIUM CHLORIDE 0.9% FLUSH
10.0000 mL | Freq: Once | INTRAVENOUS | Status: AC
  Administered 2023-08-22: 10 mL

## 2023-08-22 MED ORDER — SODIUM CHLORIDE 0.9 % IV SOLN: INTRAVENOUS | Status: DC

## 2023-08-22 MED ORDER — PROCHLORPERAZINE MALEATE 10 MG PO TABS
10.0000 mg | ORAL_TABLET | Freq: Once | ORAL | Status: AC
  Administered 2023-08-22: 10 mg via ORAL

## 2023-08-22 MED ORDER — HEPARIN SOD (PORK) LOCK FLUSH 100 UNIT/ML IV SOLN
500.0000 [IU] | Freq: Once | INTRAVENOUS | Status: AC
  Administered 2023-08-22: 500 [IU]

## 2023-08-22 MED ORDER — HEPARIN SOD (PORK) LOCK FLUSH 100 UNIT/ML IV SOLN
500.0000 [IU] | Freq: Once | INTRAVENOUS | Status: AC | PRN
  Administered 2023-08-22: 500 [IU]

## 2023-08-22 MED ORDER — ENFORTUMAB VEDOTIN-EJFV CHEMO 20 MG IV SOLR
1.2500 mg/kg | Freq: Once | INTRAVENOUS | Status: AC
  Administered 2023-08-22: 100 mg via INTRAVENOUS
  Filled 2023-08-22: qty 10

## 2023-08-22 MED ORDER — SODIUM CHLORIDE 0.9% FLUSH
10.0000 mL | INTRAVENOUS | Status: DC | PRN
  Administered 2023-08-22: 10 mL

## 2023-08-22 NOTE — Progress Notes (Signed)
 Nutrition Follow-up:  Pt with metastatic urothelial carcinoma. She is currently receiving Padcev q28d. Patient is under the care of Dr. Arbutus Ped.   1/20-1/25 hospitalization - hyponatremia  Last seen by RD 01/24/23   Met with patient in infusion. She reports fatigue and poor appetite. Patient says she just does not get hungry. Decreased appetite started with flu infection in late December. Patient reports ongoing decline since. Recalls 2 deviled eggs, greens, single rib yesterday. She tries to drink daily Ensure Plus. Sometimes 2 if she has them. Patient wants to enjoy eating again and not have to rely on ONS. Patient did not continue 1200 ml fluid restriction after hospital discharge. She did not know she was supposed to. Drinking 4 bottles of water. Patient endorses compliance with NaCl 3/day. This was reduced to 1mg /day today. Patient states she feels dried out, specifically her mouth and eyes. Patient denies nausea, vomiting, diarrhea, constipation.    Medications: synthroid, zofran, NaCl 1g tablet    Labs: glucose 134, Cr 1.38, Na - WNL  Anthropometrics: Wt 163 lb 3.2 oz decreased from 171 lb 3.2 oz on 2/13   5% loss in 2 weeks - this is severe for time frame  NUTRITION DIAGNOSIS: Unintended wt loss - continues    INTERVENTION:  Educated on strategies for poor appetite, recommend eating by the clock q2-3h (sample day with ideas written down for pt) - handout with tips provided  Ensure is filling for pt. Recommend drinking half QID. Suggested switching to Ensure Complete 2/day for added protein - samples + coupons provided  Snack ideas, soft moist high protein foods, shake recipes provided Continue NaCl tablets as prescribed per MD  Support and encouragement   MONITORING, EVALUATION, GOAL: wt trends, intake   NEXT VISIT: Thursday March 27 during infusion

## 2023-08-22 NOTE — Patient Instructions (Signed)
 [facility] - A DEPT OF [facility]  Discharge Instructions: Thank you for choosing [facility] to provide your oncology and hematology care.   If you have a lab appointment with the Cancer Center, please go directly to the Cancer Center and check in at the registration area.   Wear comfortable clothing and clothing appropriate for easy access to any Portacath or PICC line.   We strive to give you quality time with your provider. You may need to reschedule your appointment if you arrive late (15 or more minutes).  Arriving late affects you and other patients whose appointments are after yours.  Also, if you miss three or more appointments without notifying the office, you may be dismissed from the clinic at the provider's discretion.      For prescription refill requests, have your pharmacy contact our office and allow 72 hours for refills to be completed.    Today you received the following chemotherapy and/or immunotherapy agents: Padcev.      To help prevent nausea and vomiting after your treatment, we encourage you to take your nausea medication as directed.  BELOW ARE SYMPTOMS THAT SHOULD BE REPORTED IMMEDIATELY: *FEVER GREATER THAN 100.4 F (38 C) OR HIGHER *CHILLS OR SWEATING *NAUSEA AND VOMITING THAT IS NOT CONTROLLED WITH YOUR NAUSEA MEDICATION *UNUSUAL SHORTNESS OF BREATH *UNUSUAL BRUISING OR BLEEDING *URINARY PROBLEMS (pain or burning when urinating, or frequent urination) *BOWEL PROBLEMS (unusual diarrhea, constipation, pain near the anus) TENDERNESS IN MOUTH AND THROAT WITH OR WITHOUT PRESENCE OF ULCERS (sore throat, sores in mouth, or a toothache) UNUSUAL RASH, SWELLING OR PAIN  UNUSUAL VAGINAL DISCHARGE OR ITCHING   Items with * indicate a potential emergency and should be followed up as soon as possible or go to the Emergency Department if any problems should occur.  Please show the CHEMOTHERAPY ALERT CARD or IMMUNOTHERAPY  ALERT CARD at check-in to the Emergency Department and triage nurse.  Should you have questions after your visit or need to cancel or reschedule your appointment, please contact [facility] - A DEPT OF [facility]  Dept: [phone]  and follow the prompts.  Office hours are 8:00 a.m. to 4:30 p.m. Monday - Friday. Please note that voicemails left after 4:00 p.m. may not be returned until the following business day.  We are closed weekends and major holidays. You have access to a nurse at all times for urgent questions. Please call the main number to the clinic Dept: [phone] and follow the prompts.   For any non-urgent questions, you may also contact your provider using MyChart. We now offer e-Visits for anyone 35 and older to request care online for non-urgent symptoms. For details visit [email or web address].   Also download the MyChart app! Go to the app store, search "MyChart", open the app, select [place], and log in with your MyChart username and password.

## 2023-08-23 ENCOUNTER — Ambulatory Visit: Payer: Medicare Other

## 2023-08-26 ENCOUNTER — Other Ambulatory Visit: Payer: Self-pay

## 2023-08-27 ENCOUNTER — Other Ambulatory Visit: Payer: Self-pay

## 2023-08-27 DIAGNOSIS — Z720 Tobacco use: Secondary | ICD-10-CM | POA: Diagnosis not present

## 2023-08-27 DIAGNOSIS — C679 Malignant neoplasm of bladder, unspecified: Secondary | ICD-10-CM | POA: Diagnosis not present

## 2023-08-27 DIAGNOSIS — J449 Chronic obstructive pulmonary disease, unspecified: Secondary | ICD-10-CM | POA: Diagnosis not present

## 2023-08-27 DIAGNOSIS — C7911 Secondary malignant neoplasm of bladder: Secondary | ICD-10-CM | POA: Diagnosis not present

## 2023-08-27 DIAGNOSIS — D631 Anemia in chronic kidney disease: Secondary | ICD-10-CM | POA: Diagnosis not present

## 2023-08-27 DIAGNOSIS — E039 Hypothyroidism, unspecified: Secondary | ICD-10-CM | POA: Diagnosis not present

## 2023-08-29 ENCOUNTER — Other Ambulatory Visit: Payer: Medicare Other

## 2023-08-29 ENCOUNTER — Inpatient Hospital Stay: Payer: Medicare Other | Attending: Internal Medicine

## 2023-08-29 ENCOUNTER — Encounter: Payer: Self-pay | Admitting: Internal Medicine

## 2023-08-29 ENCOUNTER — Inpatient Hospital Stay: Payer: Medicare Other

## 2023-08-29 VITALS — BP 124/64 | HR 97 | Temp 97.6°F | Resp 18 | Wt 157.8 lb

## 2023-08-29 DIAGNOSIS — Z95828 Presence of other vascular implants and grafts: Secondary | ICD-10-CM

## 2023-08-29 DIAGNOSIS — E039 Hypothyroidism, unspecified: Secondary | ICD-10-CM

## 2023-08-29 DIAGNOSIS — Z885 Allergy status to narcotic agent status: Secondary | ICD-10-CM | POA: Insufficient documentation

## 2023-08-29 DIAGNOSIS — F17210 Nicotine dependence, cigarettes, uncomplicated: Secondary | ICD-10-CM | POA: Diagnosis not present

## 2023-08-29 DIAGNOSIS — Z803 Family history of malignant neoplasm of breast: Secondary | ICD-10-CM | POA: Insufficient documentation

## 2023-08-29 DIAGNOSIS — Z79899 Other long term (current) drug therapy: Secondary | ICD-10-CM | POA: Diagnosis not present

## 2023-08-29 DIAGNOSIS — Z7952 Long term (current) use of systemic steroids: Secondary | ICD-10-CM | POA: Insufficient documentation

## 2023-08-29 DIAGNOSIS — Z9089 Acquired absence of other organs: Secondary | ICD-10-CM | POA: Diagnosis not present

## 2023-08-29 DIAGNOSIS — Z809 Family history of malignant neoplasm, unspecified: Secondary | ICD-10-CM | POA: Insufficient documentation

## 2023-08-29 DIAGNOSIS — I129 Hypertensive chronic kidney disease with stage 1 through stage 4 chronic kidney disease, or unspecified chronic kidney disease: Secondary | ICD-10-CM | POA: Insufficient documentation

## 2023-08-29 DIAGNOSIS — D6481 Anemia due to antineoplastic chemotherapy: Secondary | ICD-10-CM

## 2023-08-29 DIAGNOSIS — Z881 Allergy status to other antibiotic agents status: Secondary | ICD-10-CM | POA: Insufficient documentation

## 2023-08-29 DIAGNOSIS — R21 Rash and other nonspecific skin eruption: Secondary | ICD-10-CM | POA: Diagnosis not present

## 2023-08-29 DIAGNOSIS — Z8249 Family history of ischemic heart disease and other diseases of the circulatory system: Secondary | ICD-10-CM | POA: Diagnosis not present

## 2023-08-29 DIAGNOSIS — N1831 Chronic kidney disease, stage 3a: Secondary | ICD-10-CM | POA: Diagnosis not present

## 2023-08-29 DIAGNOSIS — I252 Old myocardial infarction: Secondary | ICD-10-CM | POA: Diagnosis not present

## 2023-08-29 DIAGNOSIS — Z7902 Long term (current) use of antithrombotics/antiplatelets: Secondary | ICD-10-CM | POA: Diagnosis not present

## 2023-08-29 DIAGNOSIS — C7800 Secondary malignant neoplasm of unspecified lung: Secondary | ICD-10-CM | POA: Diagnosis not present

## 2023-08-29 DIAGNOSIS — C7910 Secondary malignant neoplasm of unspecified urinary organs: Secondary | ICD-10-CM

## 2023-08-29 DIAGNOSIS — Z833 Family history of diabetes mellitus: Secondary | ICD-10-CM | POA: Diagnosis not present

## 2023-08-29 DIAGNOSIS — C679 Malignant neoplasm of bladder, unspecified: Secondary | ICD-10-CM

## 2023-08-29 DIAGNOSIS — Z5112 Encounter for antineoplastic immunotherapy: Secondary | ICD-10-CM | POA: Insufficient documentation

## 2023-08-29 DIAGNOSIS — M549 Dorsalgia, unspecified: Secondary | ICD-10-CM | POA: Insufficient documentation

## 2023-08-29 LAB — CBC WITH DIFFERENTIAL (CANCER CENTER ONLY)
Abs Immature Granulocytes: 0.02 10*3/uL (ref 0.00–0.07)
Basophils Absolute: 0 10*3/uL (ref 0.0–0.1)
Basophils Relative: 0 %
Eosinophils Absolute: 0.4 10*3/uL (ref 0.0–0.5)
Eosinophils Relative: 7 %
HCT: 34.7 % — ABNORMAL LOW (ref 36.0–46.0)
Hemoglobin: 11.5 g/dL — ABNORMAL LOW (ref 12.0–15.0)
Immature Granulocytes: 0 %
Lymphocytes Relative: 36 %
Lymphs Abs: 1.9 10*3/uL (ref 0.7–4.0)
MCH: 33.1 pg (ref 26.0–34.0)
MCHC: 33.1 g/dL (ref 30.0–36.0)
MCV: 100 fL (ref 80.0–100.0)
Monocytes Absolute: 0.6 10*3/uL (ref 0.1–1.0)
Monocytes Relative: 12 %
Neutro Abs: 2.3 10*3/uL (ref 1.7–7.7)
Neutrophils Relative %: 45 %
Platelet Count: 328 10*3/uL (ref 150–400)
RBC: 3.47 MIL/uL — ABNORMAL LOW (ref 3.87–5.11)
RDW: 14.6 % (ref 11.5–15.5)
WBC Count: 5.3 10*3/uL (ref 4.0–10.5)
nRBC: 0.4 % — ABNORMAL HIGH (ref 0.0–0.2)

## 2023-08-29 LAB — CMP (CANCER CENTER ONLY)
ALT: 14 U/L (ref 0–44)
AST: 34 U/L (ref 15–41)
Albumin: 4 g/dL (ref 3.5–5.0)
Alkaline Phosphatase: 91 U/L (ref 38–126)
Anion gap: 8 (ref 5–15)
BUN: 21 mg/dL (ref 8–23)
CO2: 27 mmol/L (ref 22–32)
Calcium: 9.5 mg/dL (ref 8.9–10.3)
Chloride: 100 mmol/L (ref 98–111)
Creatinine: 1.53 mg/dL — ABNORMAL HIGH (ref 0.44–1.00)
GFR, Estimated: 35 mL/min — ABNORMAL LOW (ref >60)
Glucose, Bld: 139 mg/dL — ABNORMAL HIGH (ref 70–99)
Potassium: 4.4 mmol/L (ref 3.5–5.1)
Sodium: 135 mmol/L (ref 135–145)
Total Bilirubin: 0.3 mg/dL (ref 0.0–1.2)
Total Protein: 7.2 g/dL (ref 6.5–8.1)

## 2023-08-29 LAB — TSH: TSH: 14.018 u[IU]/mL — ABNORMAL HIGH (ref 0.350–4.500)

## 2023-08-29 LAB — SAMPLE TO BLOOD BANK

## 2023-08-29 MED ORDER — SODIUM CHLORIDE 0.9 % IV SOLN: INTRAVENOUS | Status: DC

## 2023-08-29 MED ORDER — PROCHLORPERAZINE MALEATE 10 MG PO TABS
10.0000 mg | ORAL_TABLET | Freq: Once | ORAL | Status: AC
  Administered 2023-08-29: 10 mg via ORAL
  Filled 2023-08-29: qty 1

## 2023-08-29 MED ORDER — SODIUM CHLORIDE 0.9% FLUSH
10.0000 mL | Freq: Once | INTRAVENOUS | Status: AC
Start: 2023-08-29 — End: 2023-08-29
  Administered 2023-08-29: 10 mL

## 2023-08-29 MED ORDER — SODIUM CHLORIDE 0.9% FLUSH
10.0000 mL | INTRAVENOUS | Status: DC | PRN
  Administered 2023-08-29: 10 mL

## 2023-08-29 MED ORDER — ENFORTUMAB VEDOTIN-EJFV CHEMO 20 MG IV SOLR
1.2500 mg/kg | Freq: Once | INTRAVENOUS | Status: AC
  Administered 2023-08-29: 100 mg via INTRAVENOUS
  Filled 2023-08-29: qty 6

## 2023-08-29 MED ORDER — HEPARIN SOD (PORK) LOCK FLUSH 100 UNIT/ML IV SOLN
500.0000 [IU] | Freq: Once | INTRAVENOUS | Status: AC | PRN
Start: 2023-08-29 — End: 2023-08-29
  Administered 2023-08-29: 500 [IU]

## 2023-08-29 NOTE — Patient Instructions (Signed)
 [facility] - A DEPT OF [facility]  Discharge Instructions: Thank you for choosing [facility] to provide your oncology and hematology care.   If you have a lab appointment with the Cancer Center, please go directly to the Cancer Center and check in at the registration area.   Wear comfortable clothing and clothing appropriate for easy access to any Portacath or PICC line.   We strive to give you quality time with your provider. You may need to reschedule your appointment if you arrive late (15 or more minutes).  Arriving late affects you and other patients whose appointments are after yours.  Also, if you miss three or more appointments without notifying the office, you may be dismissed from the clinic at the provider's discretion.      For prescription refill requests, have your pharmacy contact our office and allow 72 hours for refills to be completed.    Today you received the following chemotherapy and/or immunotherapy agents: Padcev.      To help prevent nausea and vomiting after your treatment, we encourage you to take your nausea medication as directed.  BELOW ARE SYMPTOMS THAT SHOULD BE REPORTED IMMEDIATELY: *FEVER GREATER THAN 100.4 F (38 C) OR HIGHER *CHILLS OR SWEATING *NAUSEA AND VOMITING THAT IS NOT CONTROLLED WITH YOUR NAUSEA MEDICATION *UNUSUAL SHORTNESS OF BREATH *UNUSUAL BRUISING OR BLEEDING *URINARY PROBLEMS (pain or burning when urinating, or frequent urination) *BOWEL PROBLEMS (unusual diarrhea, constipation, pain near the anus) TENDERNESS IN MOUTH AND THROAT WITH OR WITHOUT PRESENCE OF ULCERS (sore throat, sores in mouth, or a toothache) UNUSUAL RASH, SWELLING OR PAIN  UNUSUAL VAGINAL DISCHARGE OR ITCHING   Items with * indicate a potential emergency and should be followed up as soon as possible or go to the Emergency Department if any problems should occur.  Please show the CHEMOTHERAPY ALERT CARD or IMMUNOTHERAPY  ALERT CARD at check-in to the Emergency Department and triage nurse.  Should you have questions after your visit or need to cancel or reschedule your appointment, please contact [facility] - A DEPT OF [facility]  Dept: [phone]  and follow the prompts.  Office hours are 8:00 a.m. to 4:30 p.m. Monday - Friday. Please note that voicemails left after 4:00 p.m. may not be returned until the following business day.  We are closed weekends and major holidays. You have access to a nurse at all times for urgent questions. Please call the main number to the clinic Dept: [phone] and follow the prompts.   For any non-urgent questions, you may also contact your provider using MyChart. We now offer e-Visits for anyone 35 and older to request care online for non-urgent symptoms. For details visit [email or web address].   Also download the MyChart app! Go to the app store, search "MyChart", open the app, select [place], and log in with your MyChart username and password.

## 2023-08-29 NOTE — Progress Notes (Signed)
 Per Cassie, PA, okay to proceed with treatment today with creatinine 1.53

## 2023-08-30 DIAGNOSIS — C7910 Secondary malignant neoplasm of unspecified urinary organs: Secondary | ICD-10-CM | POA: Diagnosis not present

## 2023-08-30 DIAGNOSIS — I739 Peripheral vascular disease, unspecified: Secondary | ICD-10-CM | POA: Diagnosis not present

## 2023-08-30 DIAGNOSIS — E222 Syndrome of inappropriate secretion of antidiuretic hormone: Secondary | ICD-10-CM | POA: Diagnosis not present

## 2023-08-30 DIAGNOSIS — J440 Chronic obstructive pulmonary disease with acute lower respiratory infection: Secondary | ICD-10-CM | POA: Diagnosis not present

## 2023-08-30 DIAGNOSIS — C7800 Secondary malignant neoplasm of unspecified lung: Secondary | ICD-10-CM | POA: Diagnosis not present

## 2023-08-30 DIAGNOSIS — C679 Malignant neoplasm of bladder, unspecified: Secondary | ICD-10-CM | POA: Diagnosis not present

## 2023-08-30 DIAGNOSIS — D630 Anemia in neoplastic disease: Secondary | ICD-10-CM | POA: Diagnosis not present

## 2023-08-30 LAB — T4: T4, Total: 13.3 ug/dL — ABNORMAL HIGH (ref 4.5–12.0)

## 2023-08-31 ENCOUNTER — Emergency Department (HOSPITAL_COMMUNITY)
Admission: EM | Admit: 2023-08-31 | Discharge: 2023-08-31 | Disposition: A | Discharge status: Home / Self Care | Attending: Emergency Medicine | Admitting: Emergency Medicine

## 2023-08-31 DIAGNOSIS — J029 Acute pharyngitis, unspecified: Secondary | ICD-10-CM | POA: Insufficient documentation

## 2023-08-31 DIAGNOSIS — Z7982 Long term (current) use of aspirin: Secondary | ICD-10-CM | POA: Diagnosis not present

## 2023-08-31 DIAGNOSIS — R21 Rash and other nonspecific skin eruption: Secondary | ICD-10-CM | POA: Diagnosis not present

## 2023-08-31 DIAGNOSIS — C7911 Secondary malignant neoplasm of bladder: Secondary | ICD-10-CM | POA: Diagnosis not present

## 2023-08-31 MED ORDER — PREDNISONE 20 MG PO TABS
60.0000 mg | ORAL_TABLET | Freq: Once | ORAL | Status: AC
  Administered 2023-08-31: 60 mg via ORAL
  Filled 2023-08-31: qty 3

## 2023-08-31 MED ORDER — FAMOTIDINE 20 MG PO TABS
40.0000 mg | ORAL_TABLET | Freq: Once | ORAL | Status: AC
  Administered 2023-08-31: 40 mg via ORAL
  Filled 2023-08-31: qty 2

## 2023-08-31 MED ORDER — PREDNISONE 20 MG PO TABS: ORAL_TABLET | ORAL | 0 refills | Status: DC

## 2023-08-31 MED ORDER — DIPHENHYDRAMINE HCL 25 MG PO CAPS
50.0000 mg | ORAL_CAPSULE | Freq: Once | ORAL | Status: AC
  Administered 2023-08-31: 50 mg via ORAL
  Filled 2023-08-31: qty 2

## 2023-08-31 NOTE — Discharge Instructions (Signed)
 Follow up with your oncologist.  Return for difficulty breathing inability to swallow.

## 2023-08-31 NOTE — ED Provider Notes (Signed)
  EMERGENCY DEPARTMENT AT [facility] Provider Note   CSN: [id number] Arrival date & time: 08/31/23  1439     History  Chief Complaint  Patient presents with   Rash    Morgan Torres is a 73 y.o. female.  73 yo F with a chief complaints of an itchy rash.  This been going on for a day or so.  She just had an infusion for her metastatic urothelial cancer.  She did get an infusion once before and did not have any significant issues.  This time she developed a rash she has tried some Benadryl which has been helpful.  Mostly to the inside of her extremities.  She has maybe a little bit on her abdomen.  Had 1 loose stool otherwise denies diarrhea.  Denies difficulty breathing.  She has been complaining of a sore throat she felt like a salt tablet got stuck in her throat earlier in the week.   Rash      Home Medications Prior to Admission medications   Medication Sig Start Date End Date Taking? Authorizing Provider  predniSONE (DELTASONE) 20 MG tablet 2 tabs po daily x 4 days 08/31/23  Yes Melene Plan, DO  acetaminophen (TYLENOL) 500 MG tablet Take 1 tablet (500 mg total) by mouth every 6 (six) hours as needed for mild pain. 10/14/22   Rising, Lurena Joiner, PA-C  albuterol (PROVENTIL HFA;VENTOLIN HFA) 108 (90 Base) MCG/ACT inhaler Inhale 2 puffs into the lungs every 6 (six) hours as needed for wheezing or shortness of breath. 05/10/16   Sudie Grumbling, NP  aspirin EC 81 MG tablet Take 81 mg by mouth daily. Swallow whole.    [provider]  Carboxymethylcell-Glycerin PF 0.5-0.9 % SOLN Place 2 drops into both eyes 4 (four) times daily. 08/08/23   Si Gaul, MD  cilostazol (PLETAL) 50 MG tablet Take 50 mg by mouth 2 (two) times daily.    [provider]  clotrimazole (LOTRIMIN) 1 % external solution Apply topically 2 (two) times daily. 07/20/23   Regalado, Belkys A, MD  EPINEPHrine (EPIPEN 2-PAK) 0.3 mg/0.3 mL DEVI Inject 0.3 mLs (0.3 mg total) into the  muscle once as needed (for severe allergic reaction). CAll 911 immediately if you have to use this medicine 11/26/12   Piepenbrink, Victorino Dike, PA-C  guaiFENesin (MUCINEX) 600 MG 12 hr tablet Take 2 tablets (1,200 mg total) by mouth 2 (two) times daily. 07/20/23   Regalado, Jon Billings A, MD  levothyroxine (SYNTHROID) 88 MCG tablet Take 1 tablet (88 mcg total) by mouth daily before breakfast. 07/20/23   Regalado, Belkys A, MD  lidocaine-prilocaine (EMLA) cream Apply 1 Application topically as needed. 08/16/23   Heilingoetter, Cassandra L, PA-C  LORazepam (ATIVAN) 0.5 MG tablet Take 0.5 mg by mouth daily. 07/12/23   [provider]  nicotine (NICODERM CQ - DOSED IN MG/24 HOURS) 21 mg/24hr patch Place 21 mg onto the skin daily.    [provider]  nicotine polacrilex (NICORETTE) 4 MG gum Take 4 mg by mouth as needed for smoking cessation.    [provider]  nitroGLYCERIN (NITROSTAT) 0.4 MG SL tablet Place 1 tablet (0.4 mg total) under the tongue every 5 (five) minutes x 3 doses as needed for chest pain. 09/25/18   Hilty, Lisette Abu, MD  ondansetron (ZOFRAN) 8 MG tablet Take 1 tablet (8 mg total) by mouth every 8 (eight) hours as needed for nausea or vomiting. 08/08/23   Si Gaul, MD  oxybutynin [facility]) 5  MG tablet Take 1 tablet (5 mg total) by mouth every 8 (eight) hours as needed for bladder spasms. 08/23/21   Rene Paci, MD  polyethylene glycol (MIRALAX / GLYCOLAX) 17 g packet Take 17 g by mouth as needed.    [provider]  sodium chloride 1 g tablet Take 2 tablets (2 g total) by mouth 2 (two) times daily with a meal. 08/16/23   Heilingoetter, Cassandra L, PA-C  temazepam (RESTORIL) 15 MG capsule Take 1 capsule (15 mg total) by mouth at bedtime as needed for sleep. 02/06/23   Heilingoetter, Cassandra L, PA-C  traMADol (ULTRAM) 50 MG tablet Take 50 mg by mouth every 6 (six) hours as needed for moderate pain (pain score 4-6). Not taking    [provider]      Allergies    Diclofenac, Ciprofloxacin, Codeine, and Hydrocodone    Review of Systems   Review of Systems  Skin:  Positive for rash.    Physical Exam Updated Vital Signs BP (!) 162/92   Pulse 99   Temp 98.2 F (36.8 C) (Oral)   Resp 16   SpO2 100%  Physical Exam Vitals and nursing note reviewed.  Constitutional:      General: She is not in acute distress.    Appearance: She is well-developed. She is not diaphoretic.  HENT:     Head: Normocephalic and atraumatic.  Eyes:     Pupils: Pupils are equal, round, and reactive to light.  Cardiovascular:     Rate and Rhythm: Normal rate and regular rhythm.     Heart sounds: No murmur heard.    No friction rub. No gallop.  Pulmonary:     Effort: Pulmonary effort is normal.     Breath sounds: No wheezing or rales.  Abdominal:     General: There is no distension.     Palpations: Abdomen is soft.     Tenderness: There is no abdominal tenderness.  Musculoskeletal:        General: No tenderness.     Cervical back: Normal range of motion and neck supple.  Skin:    General: Skin is warm and dry.     Comments: Scattered erythema with some excoriations.  Seems to be mostly on the extremities and mostly spares the trunk.  Neurological:     Mental Status: She is alert and oriented to person, place, and time.  Psychiatric:        Behavior: Behavior normal.        ED Results / Procedures / Treatments   Labs (all labs ordered are listed, but only abnormal results are displayed) Labs Reviewed - No data to display  EKG None  Radiology No results found.  Procedures Procedures    Medications Ordered in ED Medications  diphenhydrAMINE (BENADRYL) capsule 50 mg (50 mg Oral Given 08/31/23 1708)  famotidine (PEPCID) tablet 40 mg (40 mg Oral Given 08/31/23 1708)  predniSONE (DELTASONE) tablet 60 mg (60 mg Oral Given 08/31/23 1708)    ED Course/ Medical Decision Making/ A&P                                 Medical  Decision Making Risk Prescription drug management.   73 yo F with a chief complaints of an itchy rash.  This is mostly on the extremities.  Started about a day after she got an infusion for her metastatic urothelial cancer.  She  has had this infusion once before and did not have a reaction.  Tried Benadryl at home without significant improvement.  She has had 1 loose stool otherwise denies diarrhea and vomiting denies difficulty breathing.  Will attempt to discuss with oncology.  I discussed case with Dr. Bertis Ruddy, based on my history and exam she felt reasonable to discharge on symptomatic therapy and follow-up with oncology clinic.  5:33 PM:  I have discussed the diagnosis/risks/treatment options with the patient.  Evaluation and diagnostic testing in the emergency department does not suggest an emergent condition requiring admission or immediate intervention beyond what has been performed at this time.  They will follow up with Oncology. We also discussed returning to the ED immediately if new or worsening sx occur. We discussed the sx which are most concerning (e.g., sudden worsening pain, fever, inability to tolerate by mouth) that necessitate immediate return. Medications administered to the patient during their visit and any new prescriptions provided to the patient are listed below.  Medications given during this visit Medications  diphenhydrAMINE (BENADRYL) capsule 50 mg (50 mg Oral Given 08/31/23 1708)  famotidine (PEPCID) tablet 40 mg (40 mg Oral Given 08/31/23 1708)  predniSONE (DELTASONE) tablet 60 mg (60 mg Oral Given 08/31/23 1708)     The patient appears reasonably screen and/or stabilized for discharge and I doubt any other medical condition or other [facility] requiring further screening, evaluation, or treatment in the ED at this time prior to discharge.          Final Clinical Impression(s) / ED Diagnoses Final diagnoses:  Rash    Rx / DC Orders ED Discharge Orders           Ordered    predniSONE (DELTASONE) 20 MG tablet        08/31/23 1731              Melene Plan, DO 08/31/23 1733

## 2023-08-31 NOTE — ED Triage Notes (Addendum)
 Patient c/o rash all over body x 2 days Says it is on arms, in between legs, on neck Patient declined to remove sweater in triage for assessment Says she has pain at baseline but rash is making it worse Says she was given hydrocodone Wednesday but was itching prior to taking this medication, but itching has since worsened Pain rated 7/10 Denies any difficulty breathing Took benadryl prior to arrival

## 2023-09-02 ENCOUNTER — Other Ambulatory Visit: Payer: Self-pay | Admitting: Medical Oncology

## 2023-09-02 ENCOUNTER — Encounter (HOSPITAL_COMMUNITY): Payer: Self-pay

## 2023-09-02 ENCOUNTER — Telehealth: Payer: Self-pay | Admitting: Medical Oncology

## 2023-09-02 ENCOUNTER — Other Ambulatory Visit: Payer: Self-pay

## 2023-09-02 ENCOUNTER — Inpatient Hospital Stay (HOSPITAL_COMMUNITY)
Admission: EM | Admit: 2023-09-02 | Discharge: 2023-09-05 | DRG: 607 | Disposition: A | Discharge status: Other Acute Inpatient Hospital | Source: Ambulatory Visit | Attending: Internal Medicine | Admitting: Internal Medicine

## 2023-09-02 ENCOUNTER — Inpatient Hospital Stay

## 2023-09-02 ENCOUNTER — Inpatient Hospital Stay (HOSPITAL_BASED_OUTPATIENT_CLINIC_OR_DEPARTMENT_OTHER): Admitting: Physician Assistant

## 2023-09-02 VITALS — BP 123/85 | HR 88 | Temp 97.7°F | Resp 19 | Ht 68.0 in | Wt 156.6 lb

## 2023-09-02 VITALS — HR 74 | Resp 20

## 2023-09-02 DIAGNOSIS — Z8249 Family history of ischemic heart disease and other diseases of the circulatory system: Secondary | ICD-10-CM | POA: Diagnosis not present

## 2023-09-02 DIAGNOSIS — J4489 Other specified chronic obstructive pulmonary disease: Secondary | ICD-10-CM | POA: Diagnosis not present

## 2023-09-02 DIAGNOSIS — E039 Hypothyroidism, unspecified: Secondary | ICD-10-CM | POA: Diagnosis not present

## 2023-09-02 DIAGNOSIS — E0781 Sick-euthyroid syndrome: Secondary | ICD-10-CM | POA: Diagnosis present

## 2023-09-02 DIAGNOSIS — I739 Peripheral vascular disease, unspecified: Secondary | ICD-10-CM | POA: Diagnosis present

## 2023-09-02 DIAGNOSIS — T7840XA Allergy, unspecified, initial encounter: Secondary | ICD-10-CM | POA: Diagnosis not present

## 2023-09-02 DIAGNOSIS — C7910 Secondary malignant neoplasm of unspecified urinary organs: Secondary | ICD-10-CM | POA: Diagnosis not present

## 2023-09-02 DIAGNOSIS — Z79890 Hormone replacement therapy (postmenopausal): Secondary | ICD-10-CM

## 2023-09-02 DIAGNOSIS — C7800 Secondary malignant neoplasm of unspecified lung: Secondary | ICD-10-CM | POA: Diagnosis not present

## 2023-09-02 DIAGNOSIS — C679 Malignant neoplasm of bladder, unspecified: Secondary | ICD-10-CM | POA: Diagnosis not present

## 2023-09-02 DIAGNOSIS — Z833 Family history of diabetes mellitus: Secondary | ICD-10-CM

## 2023-09-02 DIAGNOSIS — Z8709 Personal history of other diseases of the respiratory system: Secondary | ICD-10-CM

## 2023-09-02 DIAGNOSIS — G894 Chronic pain syndrome: Secondary | ICD-10-CM | POA: Diagnosis not present

## 2023-09-02 DIAGNOSIS — F32A Depression, unspecified: Secondary | ICD-10-CM | POA: Diagnosis present

## 2023-09-02 DIAGNOSIS — R339 Retention of urine, unspecified: Secondary | ICD-10-CM | POA: Diagnosis not present

## 2023-09-02 DIAGNOSIS — D638 Anemia in other chronic diseases classified elsewhere: Secondary | ICD-10-CM | POA: Diagnosis present

## 2023-09-02 DIAGNOSIS — Z7982 Long term (current) use of aspirin: Secondary | ICD-10-CM | POA: Diagnosis not present

## 2023-09-02 DIAGNOSIS — F17210 Nicotine dependence, cigarettes, uncomplicated: Secondary | ICD-10-CM | POA: Diagnosis present

## 2023-09-02 DIAGNOSIS — D630 Anemia in neoplastic disease: Secondary | ICD-10-CM | POA: Diagnosis not present

## 2023-09-02 DIAGNOSIS — G4730 Sleep apnea, unspecified: Secondary | ICD-10-CM | POA: Diagnosis present

## 2023-09-02 DIAGNOSIS — R21 Rash and other nonspecific skin eruption: Secondary | ICD-10-CM

## 2023-09-02 DIAGNOSIS — I2510 Atherosclerotic heart disease of native coronary artery without angina pectoris: Secondary | ICD-10-CM | POA: Diagnosis not present

## 2023-09-02 DIAGNOSIS — Z96642 Presence of left artificial hip joint: Secondary | ICD-10-CM | POA: Diagnosis present

## 2023-09-02 DIAGNOSIS — E871 Hypo-osmolality and hyponatremia: Secondary | ICD-10-CM | POA: Diagnosis not present

## 2023-09-02 DIAGNOSIS — F411 Generalized anxiety disorder: Secondary | ICD-10-CM | POA: Diagnosis present

## 2023-09-02 DIAGNOSIS — E860 Dehydration: Secondary | ICD-10-CM | POA: Diagnosis present

## 2023-09-02 DIAGNOSIS — Z885 Allergy status to narcotic agent status: Secondary | ICD-10-CM

## 2023-09-02 DIAGNOSIS — Z905 Acquired absence of kidney: Secondary | ICD-10-CM | POA: Diagnosis not present

## 2023-09-02 DIAGNOSIS — Z7902 Long term (current) use of antithrombotics/antiplatelets: Secondary | ICD-10-CM

## 2023-09-02 DIAGNOSIS — Z9221 Personal history of antineoplastic chemotherapy: Secondary | ICD-10-CM

## 2023-09-02 DIAGNOSIS — I129 Hypertensive chronic kidney disease with stage 1 through stage 4 chronic kidney disease, or unspecified chronic kidney disease: Secondary | ICD-10-CM | POA: Diagnosis present

## 2023-09-02 DIAGNOSIS — M797 Fibromyalgia: Secondary | ICD-10-CM | POA: Diagnosis not present

## 2023-09-02 DIAGNOSIS — N1831 Chronic kidney disease, stage 3a: Secondary | ICD-10-CM | POA: Diagnosis not present

## 2023-09-02 DIAGNOSIS — C689 Malignant neoplasm of urinary organ, unspecified: Secondary | ICD-10-CM | POA: Diagnosis not present

## 2023-09-02 DIAGNOSIS — I252 Old myocardial infarction: Secondary | ICD-10-CM | POA: Diagnosis not present

## 2023-09-02 DIAGNOSIS — Y9289 Other specified places as the place of occurrence of the external cause: Secondary | ICD-10-CM

## 2023-09-02 DIAGNOSIS — Z881 Allergy status to other antibiotic agents status: Secondary | ICD-10-CM

## 2023-09-02 DIAGNOSIS — L539 Erythematous condition, unspecified: Secondary | ICD-10-CM | POA: Diagnosis present

## 2023-09-02 DIAGNOSIS — T7840XD Allergy, unspecified, subsequent encounter: Secondary | ICD-10-CM | POA: Diagnosis not present

## 2023-09-02 DIAGNOSIS — Z888 Allergy status to other drugs, medicaments and biological substances status: Secondary | ICD-10-CM

## 2023-09-02 DIAGNOSIS — R609 Edema, unspecified: Secondary | ICD-10-CM | POA: Diagnosis present

## 2023-09-02 DIAGNOSIS — Z981 Arthrodesis status: Secondary | ICD-10-CM

## 2023-09-02 DIAGNOSIS — N19 Unspecified kidney failure: Secondary | ICD-10-CM | POA: Diagnosis present

## 2023-09-02 DIAGNOSIS — Z7952 Long term (current) use of systemic steroids: Secondary | ICD-10-CM

## 2023-09-02 DIAGNOSIS — R4182 Altered mental status, unspecified: Secondary | ICD-10-CM | POA: Diagnosis not present

## 2023-09-02 DIAGNOSIS — Z803 Family history of malignant neoplasm of breast: Secondary | ICD-10-CM

## 2023-09-02 DIAGNOSIS — T451X5A Adverse effect of antineoplastic and immunosuppressive drugs, initial encounter: Secondary | ICD-10-CM | POA: Diagnosis present

## 2023-09-02 HISTORY — DX: Secondary malignant neoplasm of unspecified urinary organs: C79.10

## 2023-09-02 LAB — COMPREHENSIVE METABOLIC PANEL
ALT: 32 U/L (ref 0–44)
AST: 41 U/L (ref 15–41)
Albumin: 3.5 g/dL (ref 3.5–5.0)
Alkaline Phosphatase: 74 U/L (ref 38–126)
Anion gap: 10 (ref 5–15)
BUN: 33 mg/dL — ABNORMAL HIGH (ref 8–23)
CO2: 22 mmol/L (ref 22–32)
Calcium: 9.1 mg/dL (ref 8.9–10.3)
Chloride: 105 mmol/L (ref 98–111)
Creatinine, Ser: 1.47 mg/dL — ABNORMAL HIGH (ref 0.44–1.00)
GFR, Estimated: 37 mL/min — ABNORMAL LOW (ref >60)
Glucose, Bld: 145 mg/dL — ABNORMAL HIGH (ref 70–99)
Potassium: 4.3 mmol/L (ref 3.5–5.1)
Sodium: 137 mmol/L (ref 135–145)
Total Bilirubin: 0.6 mg/dL (ref 0.0–1.2)
Total Protein: 6.9 g/dL (ref 6.5–8.1)

## 2023-09-02 LAB — CBC WITH DIFFERENTIAL/PLATELET
Abs Immature Granulocytes: 0.11 10*3/uL — ABNORMAL HIGH (ref 0.00–0.07)
Basophils Absolute: 0.1 10*3/uL (ref 0.0–0.1)
Basophils Relative: 1 %
Eosinophils Absolute: 0 10*3/uL (ref 0.0–0.5)
Eosinophils Relative: 0 %
HCT: 33.3 % — ABNORMAL LOW (ref 36.0–46.0)
Hemoglobin: 10.8 g/dL — ABNORMAL LOW (ref 12.0–15.0)
Immature Granulocytes: 1 %
Lymphocytes Relative: 9 %
Lymphs Abs: 0.7 10*3/uL (ref 0.7–4.0)
MCH: 33.2 pg (ref 26.0–34.0)
MCHC: 32.4 g/dL (ref 30.0–36.0)
MCV: 102.5 fL — ABNORMAL HIGH (ref 80.0–100.0)
Monocytes Absolute: 0.2 10*3/uL (ref 0.1–1.0)
Monocytes Relative: 3 %
Neutro Abs: 6.8 10*3/uL (ref 1.7–7.7)
Neutrophils Relative %: 86 %
Platelets: 304 10*3/uL (ref 150–400)
RBC: 3.25 MIL/uL — ABNORMAL LOW (ref 3.87–5.11)
RDW: 15 % (ref 11.5–15.5)
WBC: 7.9 10*3/uL (ref 4.0–10.5)
nRBC: 0 % (ref 0.0–0.2)

## 2023-09-02 LAB — MAGNESIUM: Magnesium: 1.7 mg/dL (ref 1.7–2.4)

## 2023-09-02 MED ORDER — ACETAMINOPHEN 325 MG PO TABS
650.0000 mg | ORAL_TABLET | Freq: Four times a day (QID) | ORAL | Status: DC | PRN
  Administered 2023-09-03 – 2023-09-05 (×2): 650 mg via ORAL
  Filled 2023-09-02 (×2): qty 2

## 2023-09-02 MED ORDER — LEVOTHYROXINE SODIUM 88 MCG PO TABS
88.0000 ug | ORAL_TABLET | Freq: Every day | ORAL | Status: DC
  Administered 2023-09-03 – 2023-09-05 (×3): 88 ug via ORAL
  Filled 2023-09-02 (×3): qty 1

## 2023-09-02 MED ORDER — ENSURE ENLIVE PO LIQD
237.0000 mL | Freq: Two times a day (BID) | ORAL | Status: DC
  Administered 2023-09-03 – 2023-09-05 (×6): 237 mL via ORAL

## 2023-09-02 MED ORDER — DIPHENHYDRAMINE HCL 25 MG PO CAPS
25.0000 mg | ORAL_CAPSULE | Freq: Four times a day (QID) | ORAL | Status: DC | PRN
  Administered 2023-09-02: 25 mg via ORAL
  Filled 2023-09-02: qty 1

## 2023-09-02 MED ORDER — LORAZEPAM 2 MG/ML IJ SOLN
0.5000 mg | Freq: Four times a day (QID) | INTRAMUSCULAR | Status: DC | PRN
  Administered 2023-09-04 (×2): 0.5 mg via INTRAVENOUS
  Filled 2023-09-02 (×2): qty 1

## 2023-09-02 MED ORDER — INFLUENZA VAC A&B SURF ANT ADJ 0.5 ML IM SUSY
0.5000 mL | PREFILLED_SYRINGE | INTRAMUSCULAR | Status: DC
Start: 2023-09-03 — End: 2023-09-06
  Filled 2023-09-02: qty 0.5

## 2023-09-02 MED ORDER — NALOXONE HCL 0.4 MG/ML IJ SOLN: 0.4000 mg | INTRAMUSCULAR | Status: DC | PRN

## 2023-09-02 MED ORDER — SODIUM CHLORIDE 0.9 % IV SOLN: Freq: Once | INTRAVENOUS | Status: AC

## 2023-09-02 MED ORDER — ACETAMINOPHEN 650 MG RE SUPP: 650.0000 mg | Freq: Four times a day (QID) | RECTAL | Status: DC | PRN

## 2023-09-02 MED ORDER — CILOSTAZOL 100 MG PO TABS
50.0000 mg | ORAL_TABLET | Freq: Two times a day (BID) | ORAL | Status: DC
  Administered 2023-09-03 – 2023-09-05 (×5): 50 mg via ORAL
  Filled 2023-09-02 (×6): qty 0.5

## 2023-09-02 MED ORDER — SODIUM CHLORIDE 0.9 % IV SOLN: INTRAVENOUS | Status: DC

## 2023-09-02 MED ORDER — FAMOTIDINE 20 MG PO TABS
20.0000 mg | ORAL_TABLET | Freq: Two times a day (BID) | ORAL | Status: DC
Start: 2023-09-03 — End: 2023-09-06
  Administered 2023-09-03 – 2023-09-05 (×5): 20 mg via ORAL
  Filled 2023-09-02 (×5): qty 1

## 2023-09-02 MED ORDER — FAMOTIDINE IN NACL 20-0.9 MG/50ML-% IV SOLN
20.0000 mg | Freq: Once | INTRAVENOUS | Status: AC
  Administered 2023-09-02: 20 mg via INTRAVENOUS
  Filled 2023-09-02: qty 50

## 2023-09-02 MED ORDER — ALBUTEROL SULFATE (2.5 MG/3ML) 0.083% IN NEBU: 2.5000 mg | INHALATION_SOLUTION | RESPIRATORY_TRACT | Status: DC | PRN

## 2023-09-02 MED ORDER — MELATONIN 3 MG PO TABS
3.0000 mg | ORAL_TABLET | Freq: Every evening | ORAL | Status: DC | PRN
  Administered 2023-09-03 – 2023-09-04 (×2): 3 mg via ORAL
  Filled 2023-09-02 (×2): qty 1

## 2023-09-02 MED ORDER — DIPHENHYDRAMINE HCL 50 MG/ML IJ SOLN
50.0000 mg | Freq: Once | INTRAMUSCULAR | Status: AC
  Administered 2023-09-02: 50 mg via INTRAVENOUS
  Filled 2023-09-02: qty 1

## 2023-09-02 MED ORDER — ONDANSETRON HCL 4 MG/2ML IJ SOLN: 4.0000 mg | Freq: Four times a day (QID) | INTRAMUSCULAR | Status: DC | PRN

## 2023-09-02 MED ORDER — METHYLPREDNISOLONE SODIUM SUCC 125 MG IJ SOLR
125.0000 mg | Freq: Once | INTRAMUSCULAR | Status: AC
  Administered 2023-09-02: 125 mg via INTRAVENOUS
  Filled 2023-09-02: qty 2

## 2023-09-02 MED ORDER — HYDROMORPHONE HCL 1 MG/ML IJ SOLN
0.5000 mg | INTRAMUSCULAR | Status: DC | PRN
  Administered 2023-09-02 – 2023-09-03 (×4): 0.5 mg via INTRAVENOUS
  Filled 2023-09-02: qty 0.5
  Filled 2023-09-02: qty 1
  Filled 2023-09-02 (×2): qty 0.5

## 2023-09-02 MED ORDER — OXYBUTYNIN CHLORIDE 5 MG PO TABS: 5.0000 mg | ORAL_TABLET | Freq: Three times a day (TID) | ORAL | Status: DC | PRN

## 2023-09-02 MED ORDER — METHYLPREDNISOLONE SODIUM SUCC 125 MG IJ SOLR
80.0000 mg | Freq: Two times a day (BID) | INTRAMUSCULAR | Status: DC
  Administered 2023-09-03 – 2023-09-05 (×5): 80 mg via INTRAVENOUS
  Filled 2023-09-02 (×5): qty 2

## 2023-09-02 NOTE — Telephone Encounter (Addendum)
 Rash and itching started Thursday 03/06, She said she she woke up Friday and had welps all over her body , between her legs, on her arms and neck.  She said " I am tearing up my skin from itching". Took benadryl. Used calamine lotion which dried up her skin .    Sat > ED for same, received Prednisone 60,  pepcid and benadryl.   Enfortumumab started 02/21 weekly x3. Last dose was day 15 ( 3/6 Thursday.)  Pt also stated her PCP prescribed hydrocodone on 03/05 wed for her " pain in my back and shoulders".  I told her we have that as an allergy > itching. She said " I know I only took one. "  She said  her arms hurt and when she sits on toilet her buttocks hurt  . I asked her what is causing the pain and she said the rash . I told her to apply hydrocortisone cream to rash and continue prednisone and benadryl.   She is asking to see Dr. Arbutus Ped .   [place] , [facility]

## 2023-09-02 NOTE — Progress Notes (Signed)
 Symptom Management Consult Note [facility]    Patient Care Team: [name], MD as PCP - General (Family Medicine) Rennis Golden Lisette Abu, MD as PCP - Cardiology (Cardiology) Si Gaul, MD as Consulting Physician (Oncology)    Name / MRN / DOB: Morgan Torres  [id number]  10-Oct-1950   Date of visit: 09/02/2023   Chief Complaint/Reason for visit: rash   Current Therapy: Padcev  Last treatment:  Day 15   Cycle 1 on 08/29/23   ASSESSMENT & PLAN: Patient is a 73 y.o. female with oncologic history of metastatic urothelial carcinoma followed by Dr. Arbutus Ped.  I have viewed most recent oncology note and lab work.    #Metastatic urothelial carcinoma  - Next appointment with oncologist is 09/12/23   #Rash - Concern for early signs of SJS as AE of Padcev - Administered 50 mg IV benadryl, 125 mg IV solu-medrol given, 20 mg IV pepcid in clinic. No improvement in symptoms. -Shared visit with Dr. Arbutus Ped who agrees with plan for ED evaluation and medical admission for close monitoring and high dose steroids.    Heme/Onc History: Oncology History  Metastatic urothelial carcinoma (HCC)  05/30/2022 Initial Diagnosis   Metastatic urothelial carcinoma (HCC)   06/21/2022 - 10/20/2022 Chemotherapy   Patient is on Treatment Plan : BLADDER Carboplatin D1 + Gemcitabine D1,8 q21d     11/14/2022 - 01/24/2023 Chemotherapy   Patient is on Treatment Plan : BLADDER Avelumab q14d     08/16/2023 -  Chemotherapy   Patient is on Treatment Plan : UROTHELIAL LOCALLY ADVANCED, METASTATIC Enfortumab D1,8,15 q28d     Bladder cancer metastasized to lung (HCC)  05/30/2022 Initial Diagnosis   Bladder cancer metastasized to lung [facility])   05/30/2022 Cancer Staging   Staging form: Lung, AJCC 8th Edition - Clinical: Stage IVA (cT2a, cN0, cM1a) - Signed by Si Gaul, MD on 05/30/2022       Interval history-: Discussed the use of AI scribe software for clinical note transcription  with the patient, who gave verbal consent to proceed.   Morgan Torres is a 73 y.o. female with oncologic history as above presenting to [facility] today with chief complaint of rash. Patient is accompanied by her spouse who provides additional history.  The patient presents with a severe rash and itching that started the day following her last treatment. Treatment was 3/6 and symptoms started 3/7.  She has been experiencing a severe rash and itching sensation, describing it as feeling like she has been 'on a fire. The rash is accompanied by severe pain in her back and shoulders which she believes is related to her arthritis.  She saw PCP for this and was prescribed oxycodone. She has taken this medication in the past and reports that it causes itching, however itching is able to be managed with Benadryl typically.   Patient reports rash is present on her arms, buttocks, and between her legs, with redness and hives noted. No fever or nausea, but her eyes are 'glued' shut upon waking, with tears affecting the skin on her face. It feels like the tears are burning her face which is unusual. She denies any history of similar rash. Patient was seen in the ED for the same on 08/31/26. She received PO Benadryl, pepcid, and prednisone with prescription for prednisone at discharge. Patient states that despite taking the prednisone her symptoms have drastically worsened. Her skin has started peeling. She tried applying calamine lotion which seemed to make her skin  extremely dry and tight. She has also applied cortisone without symptom improvement. Patient is tearful and endorsing severe pain from the rash that is affecting her ability to move. The symptoms have had a significant impact on her daily activities, requiring assistance to move and perform basic tasks.  ROS  All other systems are reviewed and are negative for acute change except as noted in the HPI.    Allergies  Allergen Reactions   Diclofenac  Anaphylaxis   Ciprofloxacin Rash   Codeine Itching   Hydrocodone Itching     Past Medical History:  Diagnosis Date   Anxiety    Arthritis    hip, lumbar spine    Asthma    seasonal allergies    Bronchitis    Hx: of   Chronic kidney disease    COPD (chronic obstructive pulmonary disease) (HCC)    Coronary artery disease    Depression    Fibromyalgia    Hypertension    Lumbar herniated disc    Myocardial infarction (HCC) 06/25/2012   followed by Dr. Rennis Golden, treated medically, no stents   Peripheral arterial disease (HCC)    of right foot   Pneumonia    Hx: of yrs ago   Sciatica    Sleep apnea    Small bowel obstruction (HCC)    several yrs ago     Past Surgical History:  Procedure Laterality Date   ABDOMINAL SURGERY     resection of small intestine   ANTERIOR CRUCIATE LIGAMENT REPAIR Bilateral    BACK SURGERY     fusion of lumbar   CARDIAC CATHETERIZATION  06/25/2012   COLON SURGERY     resection for bowel - for obstruction  6 to 7 yrs ago per pt on 01-05-2022   COLONOSCOPY     Hx; of   DILATION AND CURETTAGE OF UTERUS     EYE SURGERY Bilateral    cataracts removed   HERNIA REPAIR  02/23/1969   inguinal hernia    IR IMAGING GUIDED PORT INSERTION  06/07/2022   LEFT HEART CATHETERIZATION WITH CORONARY ANGIOGRAM N/A 03/30/2013   Procedure: LEFT HEART CATHETERIZATION WITH CORONARY ANGIOGRAM;  Surgeon: Marykay Lex, MD;  Location: [facility] CATH LAB;  Service: Cardiovascular;  Laterality: N/A;   left thumb surgery     yrs ago   ROBOT ASSITED LAPAROSCOPIC NEPHROURETERECTOMY Left 03/09/2021   Procedure: XI ROBOT ASSITED LAPAROSCOPIC NEPHROURETERECTOMY/ CYSTOSCOPY WITH LEFT URETEROSCOPY WITH TRANSURETHRAL RESECTION OF URETERAL ORIFICE;  Surgeon: Rene Paci, MD;  Location: [facility] ORS;  Service: Urology;  Laterality: Left;   SALIVARY GLAND SURGERY Left    approached from inside mouth & side of neck 1970-1980   TONSILLECTOMY     as child   TOTAL HIP  ARTHROPLASTY Left 05/28/2014   Procedure: LEFT TOTAL HIP ARTHROPLASTY;  Surgeon: Thera Flake., MD;  Location: [facility] OR;  Service: Orthopedics;  Laterality: Left;   TRANSURETHRAL RESECTION OF BLADDER TUMOR N/A 08/23/2021   Procedure: TRANSURETHRAL RESECTION OF BLADDER TUMOR (TURBT) WITH CYSTOSCOPY/ POSTOPERATIVE INSTILLATION OF GEMCITABINE/retrograde pyelogram and stent placement (right);  Surgeon: Rene Paci, MD;  Location: [facility];  Service: Urology;  Laterality: N/A;   TRANSURETHRAL RESECTION OF BLADDER TUMOR N/A 01/10/2022   Procedure: TRANSURETHRAL RESECTION OF BLADDER TUMOR (TURBT) WITH CYSTOSCOPY / GEMCITABINE INSTILLATION post-operatively;  Surgeon: Rene Paci, MD;  Location: [facility];  Service: Urology;  Laterality: N/A;  ONLY NEEDS 30 MIN    Social History  Socioeconomic History   Marital status: Married    Spouse name: Not on file   Number of children: Not on file   Years of education: Not on file   Highest education level: Not on file  Occupational History   Not on file  Tobacco Use   Smoking status: Some Days    Current packs/day: 0.50    Average packs/day: 0.5 packs/day for 25.0 years (12.5 ttl pk-yrs)    Types: Cigarettes   Smokeless tobacco: Never   Tobacco comments:    Currently on the Nicoderm patch."smokes a few"  Vaping Use   Vaping status: Never Used  Substance and Sexual Activity   Alcohol use: Yes    Comment: occasionally   Drug use: No   Sexual activity: Not Currently  Other Topics Concern   Not on file  Social History Narrative   Not on file   Social Drivers of Health   Financial Resource Strain: Not on file  Food Insecurity: No Food Insecurity (07/15/2023)   Hunger Vital Sign    Worried About Running Out of Food in the Last Year: Never true    Ran Out of Food in the Last Year: Never true  Transportation Needs: No Transportation Needs (07/15/2023)   PRAPARE - Doctor, general practice (Medical): No    Lack of Transportation (Non-Medical): No  Physical Activity: Not on file  Stress: Not on file  Social Connections: Unknown (07/15/2023)   Social Connection and Isolation Panel [NHANES]    Frequency of Communication with Friends and Family: Three times a week    Frequency of Social Gatherings with Friends and Family: Once a week    Attends Religious Services: Patient declined    Database administrator or Organizations: Patient declined    Attends Banker Meetings: Patient declined    Marital Status: Patient declined  Intimate Partner Violence: Not At Risk (07/15/2023)   Humiliation, Afraid, Rape, and Kick questionnaire    Fear of Current or Ex-Partner: No    Emotionally Abused: No    Physically Abused: No    Sexually Abused: No    Family History  Problem Relation Age of Onset   Hypertension Mother    Diabetes Mother    Cancer - Other Mother    Cancer Sister    Breast cancer Daughter      Current Outpatient Medications:    acetaminophen (TYLENOL) 500 MG tablet, Take 1 tablet (500 mg total) by mouth every 6 (six) hours as needed for mild pain., Disp: 30 tablet, Rfl: 0   albuterol (PROVENTIL HFA;VENTOLIN HFA) 108 (90 Base) MCG/ACT inhaler, Inhale 2 puffs into the lungs every 6 (six) hours as needed for wheezing or shortness of breath., Disp: 1 Inhaler, Rfl: 0   aspirin EC 81 MG tablet, Take 81 mg by mouth daily. Swallow whole., Disp: , Rfl:    Carboxymethylcell-Glycerin PF 0.5-0.9 % SOLN, Place 2 drops into both eyes 4 (four) times daily., Disp: 1 each, Rfl: 11   cilostazol (PLETAL) 50 MG tablet, Take 50 mg by mouth 2 (two) times daily., Disp: , Rfl:    clotrimazole (LOTRIMIN) 1 % external solution, Apply topically 2 (two) times daily., Disp: 30 mL, Rfl: 0   EPINEPHrine (EPIPEN 2-PAK) 0.3 mg/0.3 mL DEVI, Inject 0.3 mLs (0.3 mg total) into the muscle once as needed (for severe allergic reaction). CAll 911 immediately if you have to use  this medicine, Disp: 1 Device, Rfl: 1   guaiFENesin (  MUCINEX) 600 MG 12 hr tablet, Take 2 tablets (1,200 mg total) by mouth 2 (two) times daily., Disp: 120 tablet, Rfl: 0   HYDROcodone-acetaminophen (NORCO) 7.5-325 MG tablet, Take 1 tablet by mouth every 6 (six) hours as needed., Disp: , Rfl:    levothyroxine (SYNTHROID) 88 MCG tablet, Take 1 tablet (88 mcg total) by mouth daily before breakfast., Disp: 90 tablet, Rfl: 2   lidocaine-prilocaine (EMLA) cream, Apply 1 Application topically as needed., Disp: 30 g, Rfl: 2   LORazepam (ATIVAN) 0.5 MG tablet, Take 0.5 mg by mouth daily., Disp: , Rfl:    nicotine (NICODERM CQ - DOSED IN MG/24 HOURS) 21 mg/24hr patch, Place 21 mg onto the skin daily., Disp: , Rfl:    nicotine polacrilex (NICORETTE) 4 MG gum, Take 4 mg by mouth as needed for smoking cessation., Disp: , Rfl:    nitroGLYCERIN (NITROSTAT) 0.4 MG SL tablet, Place 1 tablet (0.4 mg total) under the tongue every 5 (five) minutes x 3 doses as needed for chest pain., Disp: 25 tablet, Rfl: 2   ondansetron (ZOFRAN) 8 MG tablet, Take 1 tablet (8 mg total) by mouth every 8 (eight) hours as needed for nausea or vomiting., Disp: 30 tablet, Rfl: 1   oxybutynin (DITROPAN) 5 MG tablet, Take 1 tablet (5 mg total) by mouth every 8 (eight) hours as needed for bladder spasms., Disp: 30 tablet, Rfl: 1   polyethylene glycol (MIRALAX / GLYCOLAX) 17 g packet, Take 17 g by mouth as needed., Disp: , Rfl:    predniSONE (DELTASONE) 20 MG tablet, 2 tabs po daily x 4 days, Disp: 8 tablet, Rfl: 0   sodium chloride 1 g tablet, Take 2 tablets (2 g total) by mouth 2 (two) times daily with a meal., Disp: 60 tablet, Rfl: 0   temazepam (RESTORIL) 15 MG capsule, Take 1 capsule (15 mg total) by mouth at bedtime as needed for sleep., Disp: 30 capsule, Rfl: 0  PHYSICAL EXAM: ECOG FS:2 - Symptomatic, <50% confined to bed    Vitals:   09/02/23 1206  BP: 123/85  Pulse: 88  Resp: 19  Temp: 97.7 F (36.5 C)  TempSrc: Temporal   SpO2: 100%  Weight: 156 lb 9.6 oz (71 kg)  Height: 5\' 8"  (1.727 m)   Physical Exam Vitals and nursing note reviewed.  Constitutional:      Appearance: She is not ill-appearing or toxic-appearing.     Comments: Uncomfortable appearing. No angioedema  HENT:     Head: Normocephalic.  Eyes:     Conjunctiva/sclera: Conjunctivae normal.  Cardiovascular:     Rate and Rhythm: Normal rate and regular rhythm.     Pulses: Normal pulses.     Heart sounds: Normal heart sounds.  Pulmonary:     Effort: Pulmonary effort is normal.     Breath sounds: Normal breath sounds.  Abdominal:     General: There is no distension.  Musculoskeletal:     Cervical back: Normal range of motion.  Skin:    General: Skin is warm and dry.     Comments: Extremities are dry with peeling and cracked skin. No open wound or skin sloughing. Skin is tender to light touch.  Neurological:     Mental Status: She is alert.  Psychiatric:        Mood and Affect: Affect is tearful.        LABORATORY DATA: I have reviewed the data as listed    Latest Ref Rng & Units 08/29/2023  2:24 PM 08/22/2023    8:03 AM 08/16/2023    9:38 AM  CBC  WBC 4.0 - 10.5 K/uL 5.3  5.4  5.4   Hemoglobin 12.0 - 15.0 g/dL 09.8  11.9  9.8   Hematocrit 36.0 - 46.0 % 34.7  31.1  30.2   Platelets 150 - 400 K/uL 328  242  246         Latest Ref Rng & Units 08/29/2023    2:24 PM 08/22/2023    8:03 AM 08/16/2023    9:38 AM  CMP  Glucose 70 - 99 mg/dL 147  829  562   BUN 8 - 23 mg/dL 21  21  14    Creatinine 0.44 - 1.00 mg/dL 1.30  8.65  7.84   Sodium 135 - 145 mmol/L 135  138  137   Potassium 3.5 - 5.1 mmol/L 4.4  4.0  3.9   Chloride 98 - 111 mmol/L 100  104  105   CO2 22 - 32 mmol/L 27  26  27    Calcium 8.9 - 10.3 mg/dL 9.5  9.1  8.9   Total Protein 6.5 - 8.1 g/dL 7.2  6.5  6.0   Total Bilirubin 0.0 - 1.2 mg/dL 0.3  0.3  0.4   Alkaline Phos 38 - 126 U/L 91  80  76   AST 15 - 41 U/L 34  16  12   ALT 0 - 44 U/L 14  8  8          RADIOGRAPHIC STUDIES (from last 24 hours if applicable) I have personally reviewed the radiological images as listed and agreed with the findings in the report. No results found.      Visit Diagnosis: 1. Rash   2. Bladder cancer metastasized to lung (HCC)      No orders of the defined types were placed in this encounter.   All questions were answered. The patient knows to call the clinic with any problems, questions or concerns. No barriers to learning was detected.  A total of more than 40 minutes were spent on this encounter with face-to-face time and non-face-to-face time, including preparing to see the patient, ordering tests and/or medications, counseling the patient and coordination of care as outlined above.    Thank you for allowing me to participate in the care of this patient.    Shanon Ace, PA-C Department of Hematology/Oncology [facility] at [facility] Phone: [phone]  [phone]    09/02/2023 3:17 PM  ADDENDUM: Hematology/Oncology Attending: I had a face-to-face encounter with the patient today.  I reviewed her record, lab and recommended her care plan.  This is a very pleasant 73 years old African-American female with metastatic urothelial carcinoma and currently on treatment with enfortumab vedotin infusion on days 1 and 8 every 3 weeks status post 1 cycle.  The patient came to the symptom management clinic complaining of severe rash and itching started the day following her last treatment.  She did well with the first dose of the treatment a week before.  She also has some hydrocodone before her treatment and she has known allergic reaction to hydrocodone at that time but usually in the form of itching.  She experienced the severe rash and itching sensation and feeling that her skin is on fire.  She has been trying over-the-counter antihistamine with no improvement.  She presented to the clinic today in  tears because of her significant rash between the  legs and also on the arms. We recommended for the patient treatment with intravenous Solu-Medrol 125 mg as well as Pepcid and Atarax in the clinic. There is a rare but reported severe skin rash including Stevens-Johnson and toxic epidermal necrolysis (TEN) that has been reported with this antibody treatment. The patient did not feel safe to go home with her condition and we will send her to the emergency department for further evaluation and probably admission and close monitoring. We will reevaluate her treatment in the future but after resolution of her skin rash and the itching. The patient and her husband agreed to the current plan. The total time spent in the appointment was 30 minutes. Disclaimer: This note was dictated with voice recognition software. Similar sounding words can inadvertently be transcribed and may be missed upon review. Lajuana Matte, MD

## 2023-09-02 NOTE — H&P (Signed)
 History and Physical      Morgan Torres VWU:[id number] DOB: 02/17/1951 DOA: 09/02/2023; DOS: 09/02/2023  PCP: [name], MD  Patient coming from: home   I have personally briefly reviewed patient's old medical records in [facility] Link  Chief Complaint: allergic reaction  HPI: Morgan Torres is a 73 y.o. female with medical history significant for metastatic urothelial carcinoma status post right nephro ureterectomy in September 2022, CKD stage IIIa with baseline creatinine range 1.2-1.5, GAD, COPD, acquired hypothyroidism, chronic pain syndrome, anemia of chronic disease associated baseline hemoglobin 9-12, who is admitted to [facility] on 09/02/2023 for symptomatic management relating to allergic reaction associated with erythematous after presenting from home to [facility] ED complaining of allergic reaction.   The patient has a history of metastatic urothelial carcinoma diagnosed in September 2022, status post right nephrectomy and ureterectomy in September 2022.  She follows with Dr. Arbutus Ped as her outpatient oncologist.  She underwent her most recent chemotherapy session on Thursday, 08/29/2023.  Within 1 day of this most recent chemotherapy session, she notes development of an erythematous rash on her upper and lower extremities b/l, along with involvement on her neck and upper/lower back associated with pruritus in the absence of drainage.  Not associated any mucosal involvement.  This is not been associate with any swelling of the lips or tongue, nor any difficulty swallowing, stridor, shortness of breath.  Following her chemotherapy session on 08/29/2023, she has had some intermittent nausea resulting in 1-2 episodes of nonbloody, nonbilious emesis.  Denies any associated abdominal pain, diarrhea, chest pain, shortness of breath, palpitations, diaphoresis, dizziness.   In the setting of persistence of her erythematous rash, she presented to [facility] emergency department on  08/31/2023, at which time she received systemic steroids and was discharged to home.  In the interval since discharged home from the emergency department, she is been applying topical calamine lotion to the affected areas.  She has noted persistence of the erythematous rash in the above distribution, without any significant interval progression, and presented to her outpatient oncologist office earlier today for further evaluation thereof.  While at oncology clinic, she received IV Solu-Medrol, Pepcid, Benadryl, and oncology recommended that she present to [facility] emergency department for overnight observation for symptomatic management of her erythematous rash, noting difficulty with tolerating Benadryl in the setting of her ongoing nausea, along with recommendation for high-dose systemic corticosteroids.    ED Course:  Vital signs in the ED were notable for the following: Afebrile; heart rates in the 70s to 80s; still blood pressures in the 130s 1 respiratory rate 18, oxygen saturation 100% on room air.  Labs were notable for the following: CMP was notable for the following: Creatinine 1.47 compared to most recent prior value of 1.53 on 08/29/2023, BUN to current ratio 22.4, and liver enzymes were within normal limits.  CBC notable for open cell count 7900, hemoglobin 10.8 compared to most recent prior value of 11.5 on 08/29/2023, platelet count 305.  Per my interpretation, EKG in ED demonstrated the following: No EKG performed in the ED today.  Imaging in the ED, per corresponding formal radiology read, was notable for the following: No imaging performed in the ED today.  While in the ED, the following were administered: Normal saline at 125 cc/h was initiated.  Subsequently, the patient was admitted for symptomatic management relating to presenting erythematous rash from allergic reaction, with presentation also notable for clinical evidence of dehydration and acute prerenal  azotemia.  Review of Systems: As per HPI otherwise 10 point review of systems negative.   Past Medical History:  Diagnosis Date   Anxiety    Arthritis    hip, lumbar spine    Asthma    seasonal allergies    Bronchitis    Hx: of   Chronic kidney disease    COPD (chronic obstructive pulmonary disease) (HCC)    Coronary artery disease    Depression    Fibromyalgia    Hypertension    Lumbar herniated disc    Myocardial infarction (HCC) 06/25/2012   followed by Dr. Rennis Golden, treated medically, no stents   Peripheral arterial disease (HCC)    of right foot   Pneumonia    Hx: of yrs ago   Sciatica    Sleep apnea    Small bowel obstruction (HCC)    several yrs ago    Past Surgical History:  Procedure Laterality Date   ABDOMINAL SURGERY     resection of small intestine   ANTERIOR CRUCIATE LIGAMENT REPAIR Bilateral    BACK SURGERY     fusion of lumbar   CARDIAC CATHETERIZATION  06/25/2012   COLON SURGERY     resection for bowel - for obstruction  6 to 7 yrs ago per pt on 01-05-2022   COLONOSCOPY     Hx; of   DILATION AND CURETTAGE OF UTERUS     EYE SURGERY Bilateral    cataracts removed   HERNIA REPAIR  02/23/1969   inguinal hernia    IR IMAGING GUIDED PORT INSERTION  06/07/2022   LEFT HEART CATHETERIZATION WITH CORONARY ANGIOGRAM N/A 03/30/2013   Procedure: LEFT HEART CATHETERIZATION WITH CORONARY ANGIOGRAM;  Surgeon: Marykay Lex, MD;  Location: [facility] CATH LAB;  Service: Cardiovascular;  Laterality: N/A;   left thumb surgery     yrs ago   ROBOT ASSITED LAPAROSCOPIC NEPHROURETERECTOMY Left 03/09/2021   Procedure: XI ROBOT ASSITED LAPAROSCOPIC NEPHROURETERECTOMY/ CYSTOSCOPY WITH LEFT URETEROSCOPY WITH TRANSURETHRAL RESECTION OF URETERAL ORIFICE;  Surgeon: Rene Paci, MD;  Location: [facility] ORS;  Service: Urology;  Laterality: Left;   SALIVARY GLAND SURGERY Left    approached from inside mouth & side of neck 1970-1980   TONSILLECTOMY     as child   TOTAL HIP  ARTHROPLASTY Left 05/28/2014   Procedure: LEFT TOTAL HIP ARTHROPLASTY;  Surgeon: Thera Flake., MD;  Location: [facility] OR;  Service: Orthopedics;  Laterality: Left;   TRANSURETHRAL RESECTION OF BLADDER TUMOR N/A 08/23/2021   Procedure: TRANSURETHRAL RESECTION OF BLADDER TUMOR (TURBT) WITH CYSTOSCOPY/ POSTOPERATIVE INSTILLATION OF GEMCITABINE/retrograde pyelogram and stent placement (right);  Surgeon: Rene Paci, MD;  Location: [facility];  Service: Urology;  Laterality: N/A;   TRANSURETHRAL RESECTION OF BLADDER TUMOR N/A 01/10/2022   Procedure: TRANSURETHRAL RESECTION OF BLADDER TUMOR (TURBT) WITH CYSTOSCOPY / GEMCITABINE INSTILLATION post-operatively;  Surgeon: Rene Paci, MD;  Location: [facility];  Service: Urology;  Laterality: N/A;  ONLY NEEDS 30 MIN    Social History:  reports that she has been smoking cigarettes. She has a 12.5 pack-year smoking history. She has never used smokeless tobacco. She reports current alcohol use. She reports that she does not use drugs.   Allergies  Allergen Reactions   Diclofenac Anaphylaxis   Ciprofloxacin Rash   Codeine Itching   Hydrocodone Itching    Family History  Problem Relation Age of Onset   Hypertension Mother    Diabetes Mother    Cancer - Other Mother  Cancer Sister    Breast cancer Daughter     Family history reviewed and not pertinent    Prior to Admission medications   Medication Sig Start Date End Date Taking? Authorizing Provider  acetaminophen (TYLENOL) 500 MG tablet Take 1 tablet (500 mg total) by mouth every 6 (six) hours as needed for mild pain. 10/14/22   Rising, Lurena Joiner, PA-C  albuterol (PROVENTIL HFA;VENTOLIN HFA) 108 (90 Base) MCG/ACT inhaler Inhale 2 puffs into the lungs every 6 (six) hours as needed for wheezing or shortness of breath. 05/10/16   Sudie Grumbling, NP  aspirin EC 81 MG tablet Take 81 mg by mouth daily. Swallow whole.    [provider]   Carboxymethylcell-Glycerin PF 0.5-0.9 % SOLN Place 2 drops into both eyes 4 (four) times daily. 08/08/23   Si Gaul, MD  cilostazol (PLETAL) 50 MG tablet Take 50 mg by mouth 2 (two) times daily.    [provider]  clotrimazole (LOTRIMIN) 1 % external solution Apply topically 2 (two) times daily. 07/20/23   Regalado, Belkys A, MD  EPINEPHrine (EPIPEN 2-PAK) 0.3 mg/0.3 mL DEVI Inject 0.3 mLs (0.3 mg total) into the muscle once as needed (for severe allergic reaction). CAll 911 immediately if you have to use this medicine 11/26/12   Piepenbrink, Victorino Dike, PA-C  guaiFENesin (MUCINEX) 600 MG 12 hr tablet Take 2 tablets (1,200 mg total) by mouth 2 (two) times daily. 07/20/23   Regalado, Belkys A, MD  HYDROcodone-acetaminophen (NORCO) 7.5-325 MG tablet Take 1 tablet by mouth every 6 (six) hours as needed. 08/27/23   [provider]  levothyroxine (SYNTHROID) 88 MCG tablet Take 1 tablet (88 mcg total) by mouth daily before breakfast. 07/20/23   Regalado, Belkys A, MD  lidocaine-prilocaine (EMLA) cream Apply 1 Application topically as needed. 08/16/23   Heilingoetter, Cassandra L, PA-C  LORazepam (ATIVAN) 0.5 MG tablet Take 0.5 mg by mouth daily. 07/12/23   [provider]  nicotine (NICODERM CQ - DOSED IN MG/24 HOURS) 21 mg/24hr patch Place 21 mg onto the skin daily.    [provider]  nicotine polacrilex (NICORETTE) 4 MG gum Take 4 mg by mouth as needed for smoking cessation.    [provider]  nitroGLYCERIN (NITROSTAT) 0.4 MG SL tablet Place 1 tablet (0.4 mg total) under the tongue every 5 (five) minutes x 3 doses as needed for chest pain. 09/25/18   Hilty, Lisette Abu, MD  ondansetron (ZOFRAN) 8 MG tablet Take 1 tablet (8 mg total) by mouth every 8 (eight) hours as needed for nausea or vomiting. 08/08/23   Si Gaul, MD  oxybutynin (DITROPAN) 5 MG tablet Take 1 tablet (5 mg total) by mouth every 8 (eight) hours as needed for bladder spasms. 08/23/21   Rene Paci, MD  polyethylene glycol (MIRALAX / GLYCOLAX) 17 g packet Take 17 g by mouth as needed.    [provider]  predniSONE (DELTASONE) 20 MG tablet 2 tabs po daily x 4 days 08/31/23   Melene Plan, DO  sodium chloride 1 g tablet Take 2 tablets (2 g total) by mouth 2 (two) times daily with a meal. 08/16/23   Heilingoetter, Cassandra L, PA-C  temazepam (RESTORIL) 15 MG capsule Take 1 capsule (15 mg total) by mouth at bedtime as needed for sleep. 02/06/23   Heilingoetter, Cassandra L, PA-C     Objective    Physical Exam: Vitals:   09/02/23 1456 09/02/23 1825  BP: 130/77 131/80  Pulse: 75 82  Resp:  18 18  Temp: 97.7 F (36.5 C) (!) 97.5 F (36.4 C)  TempSrc: Oral Oral  SpO2: 100% 100%    General: appears to be stated age; alert, oriented Skin: warm, dry, erythematous, non- urticarial, non-raised rash with some hyperpigmentation involving the upper and lower extremities bilaterally, neck, back Head:  AT/[place]onda Mouth:  Oral mucosa membranes appear dry, without lesions or ulcers noted, normal dentition Neck: supple; trachea midline Heart:  RRR; did not appreciate any M/R/G Lungs: CTAB, did not appreciate any wheezes, rales, or rhonchi Abdomen: + BS; soft, ND, NT Vascular: 2+ pedal pulses b/l; 2+ radial pulses b/l Extremities: no peripheral edema, no muscle wasting; erythematous rash as outlined above Neuro: strength and sensation intact in upper and lower extremities b/l    Labs on Admission: I have personally reviewed following labs and imaging studies  CBC: Recent Labs  Lab 08/29/23 1424 09/02/23 1806  WBC 5.3 7.9  NEUTROABS 2.3 6.8  HGB 11.5* 10.8*  HCT 34.7* 33.3*  MCV 100.0 102.5*  PLT 328 304   Basic Metabolic Panel: Recent Labs  Lab 08/29/23 1424 09/02/23 1806  NA 135 137  K 4.4 4.3  CL 100 105  CO2 27 22  GLUCOSE 139* 145*  BUN 21 33*  CREATININE 1.53* 1.47*  CALCIUM 9.5 9.1   GFR: Estimated Creatinine Clearance: 33.9 mL/min (A) (by C-G  formula based on SCr of 1.47 mg/dL (H)). Liver Function Tests: Recent Labs  Lab 08/29/23 1424 09/02/23 1806  AST 34 41  ALT 14 32  ALKPHOS 91 74  BILITOT 0.3 0.6  PROT 7.2 6.9  ALBUMIN 4.0 3.5   No results for input(s): "LIPASE", "AMYLASE" in the last 168 hours. No results for input(s): "AMMONIA" in the last 168 hours. Coagulation Profile: No results for input(s): "INR", "PROTIME" in the last 168 hours. Cardiac Enzymes: No results for input(s): "CKTOTAL", "CKMB", "CKMBINDEX", "TROPONINI" in the last 168 hours. BNP (last 3 results) No results for input(s): "PROBNP" in the last 8760 hours. HbA1C: No results for input(s): "HGBA1C" in the last 72 hours. CBG: No results for input(s): "GLUCAP" in the last 168 hours. Lipid Profile: No results for input(s): "CHOL", "HDL", "LDLCALC", "TRIG", "CHOLHDL", "LDLDIRECT" in the last 72 hours. Thyroid Function Tests: No results for input(s): "TSH", "T4TOTAL", "FREET4", "T3FREE", "THYROIDAB" in the last 72 hours. Anemia Panel: No results for input(s): "VITAMINB12", "FOLATE", "FERRITIN", "TIBC", "IRON", "RETICCTPCT" in the last 72 hours. Urine analysis:    Component Value Date/Time   COLORURINE STRAW (A) 07/16/2023 1926   APPEARANCEUR CLEAR 07/16/2023 1926   LABSPEC 1.005 07/16/2023 1926   PHURINE 5.0 07/16/2023 1926   GLUCOSEU NEGATIVE 07/16/2023 1926   HGBUR NEGATIVE 07/16/2023 1926   BILIRUBINUR NEGATIVE 07/16/2023 1926   KETONESUR NEGATIVE 07/16/2023 1926   PROTEINUR NEGATIVE 07/16/2023 1926   UROBILINOGEN 0.2 01/25/2019 1636   NITRITE NEGATIVE 07/16/2023 1926   LEUKOCYTESUR NEGATIVE 07/16/2023 1926    Radiological Exams on Admission: No results found.    Assessment/Plan    Principal Problem:   Allergic reaction Active Problems:   Anemia of chronic disease   GAD (generalized anxiety disorder)   Metastatic urothelial carcinoma (HCC)   Erythematous rash   Dehydration   Acute prerenal azotemia   CKD stage 3a, GFR 45-59  ml/min (HCC)   History of COPD   Acquired hypothyroidism   Chronic pain syndrome    #) Allergic reaction/Erythematous rash: The patient presents with 3 days of erythematous, nonurticarial, nonraised, pruritic rash w/ areas of relative hyperpigmentation  involving bilateral upper and lower extremities, neck, and back, which started on Friday, 08/30/2023, with onset within 1 day of chemotherapy session completed on Thursday, 08/29/2023.  Given timeframe and proximity to his chemotherapy session, suspected to be allergic in nature although the specific timing of onset as well as the absence of additional organ systems, aside from potential GI involvement with some recent nausea/vomiting, which may well be as a consequence of the chemotherapy itself, speak against an anaphylactic component to his suspected allergic reaction.  No overt mucosal involvement to suggest SJS.  No evidence of associated angioedema or airway compromise.  No evidence of acute or subacute respiratory distress, and she appears hemodynamically stable.  She followed up in the office of her oncologist today, who recommended overnight observation for symptomatic management of her pruritic rash, noting complicating feature of her recurrent nausea/vomiting, limiting outpatient p.o. options at the moment, along with recommendation for high-dose systemic corticosteroids.  Of note, she is already received a dose of solumedrol in oncology clinic earlier today.  Next dose of solumedrol will therefore occur tomorrow, 09/03/2023.  Plan: As needed Benadryl for pruritus.  Per oncology recommendations, will start solumedrol 80 mg IV twice daily, with next dose to occur tomorrow.  Pepcid twice daily. normal saline at 125 cc/h monitor strict I's and O's and daily weights.  Repeat CMP, CBC in the morning.  I have added her out oncologist, Dr. Arbutus Ped, to the treatment team starting at 7 AM  on 3/11.                        #) Dehydration:  Clinical suspicion for such, including the appearance of dry oral mucous membranes as well as laboratory findings notable for acute prerenal azotemia. Appears to be in the setting of   intermittent GI losses (nausea/vomiting following most recently completed chemotherapy session concomitant with relative decline in oral intake over that timeframe.  No e/o associated hypotension.  Renal function appears to be at baseline.   Plan: Monitor strict I's and O's.  Daily weights.  CMP in the morning. IVF's in form of normal saline at 125 cc/h.  As needed IV Zofran for nausea.                   #) Metastatic urothelial carcinoma: Documented history of such, originally diagnosed in September 2022, at which time she underwent right nephroureterectomy.  Follows with Dr. Arbutus Ped as her outpatient oncologist, undergoing chemotherapy, with most recently completing chemotherapy session occurring on Thursday, 08/29/2023.  Associated with chronic pain syndrome, on prn Norco at home.  However, limited ability to tolerate p.o. Norco at the time in the setting of recurrent nausea/vomiting following, has recently completed hemodialysis session  Plan: I have added her out oncologist, Dr. Arbutus Ped, to the treatment team starting at 7 AM  on 3/11.  In the setting of recurrent nausea, will hold home prn Norco for now and lieu of prn IV Dilaudid.  As needed IV Zofran.                    #) CKD Stage  3a: Documented history of such, with baseline creatinine 1.2-1.5 , with presenting creatinine consistent with this baseline.  This is in the context of a history of right nephro ureterectomy in September 2022 in the setting of metastatic urothelial carcinoma.  Plan: Monitor strict I's and O's and daily weights.  Attempt to avoid nephrotoxic agents.  CMP/magnesium level in the  AM.                      #) Generalized anxiety disorder: documented h/o such. On scheduled Ativan as  outpatient.    Plan: For now, will convert her outpatient scheduled oral Ativan to prn IV Ativan for anxiety in the setting of her intermittent nausea/vomiting over the last few days.Marland Kitchen                   #) COPD: Documented history thereof, without clinical evidence of acute exacerbation at this time.  Outpatient respiratory regimen includes the following: Prn albuterol inhaler.   Plan: Prn albuterol nebulizer. Check CMP and serum magnesium level in the AM.                      #) acquired hypothyroidism: documented h/o such, on Synthroid as outpatient.   Plan: cont home Synthroid.                      #) Anemia of chronic disease: Documented history of such, a/w with baseline hgb range 9-12, with presenting hgb consistent with this range, in the absence of any overt evidence of active bleed.  Her presenting hemoglobin of 10.8 is noted to be associated with mildly macrocytic finding.   Plan: Repeat CBC in the morning.  Check PTT, INR.     DVT prophylaxis: SCD's   Code Status: Full code Family Communication: none Disposition Plan: Per Rounding Team Consults called:  I have added her out oncologist, Dr. Arbutus Ped, to the treatment team starting at 7 AM  on 3/11.;  Admission status: Observation    I SPENT GREATER THAN 75  MINUTES IN CLINICAL CARE TIME/MEDICAL DECISION-MAKING IN COMPLETING THIS ADMISSION.     Chaney Born Sussie Minor DO [facility] From 7PM - 7AM   09/02/2023, 7:36 PM

## 2023-09-02 NOTE — ED Provider Notes (Signed)
 [place] EMERGENCY DEPARTMENT AT [facility] Provider Note   CSN: [id number] Arrival date & time: 09/02/23  1438     History  Chief Complaint  Patient presents with   Rash   Generalized Burning Sensation    Morgan Torres is a 73 y.o. female.  73 year old female presents with worsening rash times several days.  Seen in the ED and treated for suspect allergic reaction to chemotherapeutic agents.  Family at bedside states that the rash has been diffuse and pruritic and started after she received chemotherapy.  Denies any fever or chills.  Denies any oral lesions.  Patient has been using calamine lotion on her body.  Was seen at the cancer center today and treated with Benadryl, Solu-Medrol, Pepcid.  Was sent to the ED for possible Stevens-Johnson syndrome to be admitted for high-dose steroids per her oncologist.       Home Medications Prior to Admission medications   Medication Sig Start Date End Date Taking? Authorizing Provider  acetaminophen (TYLENOL) 500 MG tablet Take 1 tablet (500 mg total) by mouth every 6 (six) hours as needed for mild pain. 10/14/22   Rising, Lurena Joiner, PA-C  albuterol (PROVENTIL HFA;VENTOLIN HFA) 108 (90 Base) MCG/ACT inhaler Inhale 2 puffs into the lungs every 6 (six) hours as needed for wheezing or shortness of breath. 05/10/16   Sudie Grumbling, NP  aspirin EC 81 MG tablet Take 81 mg by mouth daily. Swallow whole.    [provider]  Carboxymethylcell-Glycerin PF 0.5-0.9 % SOLN Place 2 drops into both eyes 4 (four) times daily. 08/08/23   Si Gaul, MD  cilostazol (PLETAL) 50 MG tablet Take 50 mg by mouth 2 (two) times daily.    [provider]  clotrimazole (LOTRIMIN) 1 % external solution Apply topically 2 (two) times daily. 07/20/23   Regalado, Belkys A, MD  EPINEPHrine (EPIPEN 2-PAK) 0.3 mg/0.3 mL DEVI Inject 0.3 mLs (0.3 mg total) into the muscle once as needed (for severe allergic reaction). CAll 911 immediately if  you have to use this medicine 11/26/12   Piepenbrink, Victorino Dike, PA-C  guaiFENesin (MUCINEX) 600 MG 12 hr tablet Take 2 tablets (1,200 mg total) by mouth 2 (two) times daily. 07/20/23   Regalado, Belkys A, MD  HYDROcodone-acetaminophen (NORCO) 7.5-325 MG tablet Take 1 tablet by mouth every 6 (six) hours as needed. 08/27/23   [provider]  levothyroxine (SYNTHROID) 88 MCG tablet Take 1 tablet (88 mcg total) by mouth daily before breakfast. 07/20/23   Regalado, Belkys A, MD  lidocaine-prilocaine (EMLA) cream Apply 1 Application topically as needed. 08/16/23   Heilingoetter, Cassandra L, PA-C  LORazepam (ATIVAN) 0.5 MG tablet Take 0.5 mg by mouth daily. 07/12/23   [provider]  nicotine (NICODERM CQ - DOSED IN MG/24 HOURS) 21 mg/24hr patch Place 21 mg onto the skin daily.    [provider]  nicotine polacrilex (NICORETTE) 4 MG gum Take 4 mg by mouth as needed for smoking cessation.    [provider]  nitroGLYCERIN (NITROSTAT) 0.4 MG SL tablet Place 1 tablet (0.4 mg total) under the tongue every 5 (five) minutes x 3 doses as needed for chest pain. 09/25/18   Hilty, Lisette Abu, MD  ondansetron (ZOFRAN) 8 MG tablet Take 1 tablet (8 mg total) by mouth every 8 (eight) hours as needed for nausea or vomiting. 08/08/23   Si Gaul, MD  oxybutynin (DITROPAN) 5 MG tablet Take 1 tablet (5 mg total) by mouth every 8 (eight) hours  as needed for bladder spasms. 08/23/21   Rene Paci, MD  polyethylene glycol (MIRALAX / GLYCOLAX) 17 g packet Take 17 g by mouth as needed.    [provider]  predniSONE (DELTASONE) 20 MG tablet 2 tabs po daily x 4 days 08/31/23   Melene Plan, DO  sodium chloride 1 g tablet Take 2 tablets (2 g total) by mouth 2 (two) times daily with a meal. 08/16/23   Heilingoetter, Cassandra L, PA-C  temazepam (RESTORIL) 15 MG capsule Take 1 capsule (15 mg total) by mouth at bedtime as needed for sleep. 02/06/23   Heilingoetter, Cassandra L, PA-C       Allergies    Diclofenac, Ciprofloxacin, Codeine, and Hydrocodone    Review of Systems   Review of Systems  All other systems reviewed and are negative.   Physical Exam Updated Vital Signs BP 130/77 (BP Location: Right Arm)   Pulse 75   Temp 97.7 F (36.5 C) (Oral)   Resp 18   SpO2 100%  Physical Exam Vitals and nursing note reviewed.  Constitutional:      General: She is not in acute distress.    Appearance: Normal appearance. She is well-developed. She is not toxic-appearing.  HENT:     Head: Normocephalic and atraumatic.     Mouth/Throat:     Comments: No oral lesions appreciated Eyes:     General: Lids are normal.     Conjunctiva/sclera: Conjunctivae normal.     Pupils: Pupils are equal, round, and reactive to light.  Neck:     Thyroid: No thyroid mass.     Trachea: No tracheal deviation.  Cardiovascular:     Rate and Rhythm: Normal rate and regular rhythm.     Heart sounds: Normal heart sounds. No murmur heard.    No gallop.  Pulmonary:     Effort: Pulmonary effort is normal. No respiratory distress.     Breath sounds: Normal breath sounds. No stridor. No decreased breath sounds, wheezing, rhonchi or rales.  Abdominal:     General: There is no distension.     Palpations: Abdomen is soft.     Tenderness: There is no abdominal tenderness. There is no rebound.  Musculoskeletal:        General: No tenderness. Normal range of motion.     Cervical back: Normal range of motion and neck supple.  Skin:    General: Skin is warm and dry.     Findings: Abrasion and rash present. Rash is macular. Rash is not crusting, nodular or purpuric.  Neurological:     Mental Status: She is alert and oriented to person, place, and time. Mental status is at baseline.     GCS: GCS eye subscore is 4. GCS verbal subscore is 5. GCS motor subscore is 6.     Cranial Nerves: No cranial nerve deficit.     Sensory: No sensory deficit.     Motor: Motor function is intact.  Psychiatric:         Attention and Perception: Attention normal.        Speech: Speech normal.        Behavior: Behavior normal.     ED Results / Procedures / Treatments   Labs (all labs ordered are listed, but only abnormal results are displayed) Labs Reviewed  CBC WITH DIFFERENTIAL/PLATELET  COMPREHENSIVE METABOLIC PANEL    EKG None  Radiology No results found.  Procedures Procedures    Medications Ordered in ED Medications  0.9 %  sodium chloride infusion (has no administration in time range)    ED Course/ Medical Decision Making/ A&P                                 Medical Decision Making Amount and/or Complexity of Data Reviewed Labs: ordered.  Risk Prescription drug management.   Patient has no evidence of desquamation here.  She has no oral involvement at this time.  Skin is slightly erythematous.  Labs are reassuring here.  No evidence of Stevens-Johnson at this time.  Oncology requested patient admitted for high-dose IV steroids and monitoring.  Do not feel that she needs a burn center at this time.  Will consult hospitalist team        Final Clinical Impression(s) / ED Diagnoses Final diagnoses:  None    Rx / DC Orders ED Discharge Orders     None         Lorre Nick, MD 09/02/23 [phone]

## 2023-09-02 NOTE — ED Triage Notes (Addendum)
 Pt BIB Cancer Center due to worsening rash all over body. Pt brought to ED 3/8 for rash pt report it has gotten worsen and feeling to a burning sensation all over body. Pt sensitive to touch. Pt has Hx of lung cancer. Rash is a concern of early signs of Stevens-Johnson syndrome (SJS); as a result of cancer treatment.  In Route Port accessed 125 mg Solu-Medrol 20 mg Pepcid 50 mg Benadryl

## 2023-09-02 NOTE — Progress Notes (Signed)
 Patient transferred to the ED Room #8 for further evaluation for suspected SJS.  Patient transported via wheelchair with all belongings. Patient's husband accompanied the transfer. Bedside report given to [place], Charity fundraiser.

## 2023-09-03 ENCOUNTER — Other Ambulatory Visit: Payer: Self-pay

## 2023-09-03 DIAGNOSIS — C7910 Secondary malignant neoplasm of unspecified urinary organs: Secondary | ICD-10-CM | POA: Diagnosis not present

## 2023-09-03 DIAGNOSIS — E039 Hypothyroidism, unspecified: Secondary | ICD-10-CM | POA: Diagnosis not present

## 2023-09-03 DIAGNOSIS — T7840XD Allergy, unspecified, subsequent encounter: Secondary | ICD-10-CM

## 2023-09-03 DIAGNOSIS — R21 Rash and other nonspecific skin eruption: Secondary | ICD-10-CM | POA: Diagnosis not present

## 2023-09-03 LAB — CBC WITH DIFFERENTIAL/PLATELET
Abs Immature Granulocytes: 0.14 10*3/uL — ABNORMAL HIGH (ref 0.00–0.07)
Basophils Absolute: 0 10*3/uL (ref 0.0–0.1)
Basophils Relative: 1 %
Eosinophils Absolute: 0.2 10*3/uL (ref 0.0–0.5)
Eosinophils Relative: 2 %
HCT: 31.4 % — ABNORMAL LOW (ref 36.0–46.0)
Hemoglobin: 10 g/dL — ABNORMAL LOW (ref 12.0–15.0)
Immature Granulocytes: 2 %
Lymphocytes Relative: 11 %
Lymphs Abs: 0.9 10*3/uL (ref 0.7–4.0)
MCH: 33 pg (ref 26.0–34.0)
MCHC: 31.8 g/dL (ref 30.0–36.0)
MCV: 103.6 fL — ABNORMAL HIGH (ref 80.0–100.0)
Monocytes Absolute: 0.6 10*3/uL (ref 0.1–1.0)
Monocytes Relative: 7 %
Neutro Abs: 6.6 10*3/uL (ref 1.7–7.7)
Neutrophils Relative %: 77 %
Platelets: 311 10*3/uL (ref 150–400)
RBC: 3.03 MIL/uL — ABNORMAL LOW (ref 3.87–5.11)
RDW: 15.2 % (ref 11.5–15.5)
WBC: 8.5 10*3/uL (ref 4.0–10.5)
nRBC: 0 % (ref 0.0–0.2)

## 2023-09-03 LAB — COMPREHENSIVE METABOLIC PANEL
ALT: 30 U/L (ref 0–44)
AST: 35 U/L (ref 15–41)
Albumin: 3.3 g/dL — ABNORMAL LOW (ref 3.5–5.0)
Alkaline Phosphatase: 64 U/L (ref 38–126)
Anion gap: 8 (ref 5–15)
BUN: 32 mg/dL — ABNORMAL HIGH (ref 8–23)
CO2: 22 mmol/L (ref 22–32)
Calcium: 8.2 mg/dL — ABNORMAL LOW (ref 8.9–10.3)
Chloride: 103 mmol/L (ref 98–111)
Creatinine, Ser: 1.43 mg/dL — ABNORMAL HIGH (ref 0.44–1.00)
GFR, Estimated: 38 mL/min — ABNORMAL LOW (ref >60)
Glucose, Bld: 149 mg/dL — ABNORMAL HIGH (ref 70–99)
Potassium: 4.4 mmol/L (ref 3.5–5.1)
Sodium: 133 mmol/L — ABNORMAL LOW (ref 135–145)
Total Bilirubin: 0.4 mg/dL (ref 0.0–1.2)
Total Protein: 6.5 g/dL (ref 6.5–8.1)

## 2023-09-03 LAB — PROTIME-INR
INR: 1 (ref 0.8–1.2)
Prothrombin Time: 13.7 s (ref 11.4–15.2)

## 2023-09-03 LAB — MAGNESIUM: Magnesium: 1.7 mg/dL (ref 1.7–2.4)

## 2023-09-03 LAB — APTT: aPTT: 24 s (ref 24–36)

## 2023-09-03 MED ORDER — TRAMADOL HCL 50 MG PO TABS: 50.0000 mg | ORAL_TABLET | Freq: Four times a day (QID) | ORAL | Status: DC | PRN

## 2023-09-03 MED ORDER — HYDROMORPHONE HCL 1 MG/ML IJ SOLN
1.0000 mg | INTRAMUSCULAR | Status: DC | PRN
  Administered 2023-09-03 – 2023-09-05 (×8): 1 mg via INTRAVENOUS
  Filled 2023-09-03 (×8): qty 1

## 2023-09-03 MED ORDER — TEMAZEPAM 15 MG PO CAPS: 15.0000 mg | ORAL_CAPSULE | Freq: Every evening | ORAL | Status: DC | PRN

## 2023-09-03 MED ORDER — POLYETHYLENE GLYCOL 3350 17 G PO PACK
17.0000 g | PACK | Freq: Every day | ORAL | Status: DC
  Administered 2023-09-03 – 2023-09-05 (×3): 17 g via ORAL
  Filled 2023-09-03 (×3): qty 1

## 2023-09-03 MED ORDER — SODIUM CHLORIDE 1 G PO TABS
1.0000 g | ORAL_TABLET | Freq: Every day | ORAL | Status: DC
  Administered 2023-09-03 – 2023-09-05 (×3): 1 g via ORAL
  Filled 2023-09-03 (×3): qty 1

## 2023-09-03 MED ORDER — ASPIRIN 81 MG PO TBEC
81.0000 mg | DELAYED_RELEASE_TABLET | Freq: Every day | ORAL | Status: DC
  Administered 2023-09-03 – 2023-09-05 (×3): 81 mg via ORAL
  Filled 2023-09-03 (×3): qty 1

## 2023-09-03 MED ORDER — SENNOSIDES-DOCUSATE SODIUM 8.6-50 MG PO TABS
2.0000 | ORAL_TABLET | Freq: Two times a day (BID) | ORAL | Status: DC
  Administered 2023-09-03 – 2023-09-05 (×5): 2 via ORAL
  Filled 2023-09-03 (×5): qty 2

## 2023-09-03 MED ORDER — NICOTINE POLACRILEX 2 MG MT GUM
2.0000 mg | CHEWING_GUM | OROMUCOSAL | Status: DC | PRN
  Filled 2023-09-03: qty 1

## 2023-09-03 MED ORDER — NICOTINE 14 MG/24HR TD PT24
14.0000 mg | MEDICATED_PATCH | Freq: Every day | TRANSDERMAL | Status: DC
  Administered 2023-09-03 – 2023-09-05 (×3): 14 mg via TRANSDERMAL
  Filled 2023-09-03 (×3): qty 1

## 2023-09-03 MED ORDER — DIPHENHYDRAMINE HCL 50 MG/ML IJ SOLN
25.0000 mg | Freq: Four times a day (QID) | INTRAMUSCULAR | Status: DC | PRN
Start: 2023-09-03 — End: 2023-09-06
  Administered 2023-09-03 – 2023-09-04 (×3): 25 mg via INTRAVENOUS
  Filled 2023-09-03 (×3): qty 1

## 2023-09-03 MED ORDER — POLYVINYL ALCOHOL 1.4 % OP SOLN
2.0000 [drp] | Freq: Four times a day (QID) | OPHTHALMIC | Status: DC
  Administered 2023-09-03 – 2023-09-05 (×6): 2 [drp] via OPHTHALMIC
  Filled 2023-09-03 (×2): qty 15

## 2023-09-03 NOTE — Care Management Obs Status (Signed)
 MEDICARE OBSERVATION STATUS NOTIFICATION   Patient Details  Name: Morgan Torres MRN: [id number] Date of Birth: [birth year]   Medicare Observation Status Notification Given:  Yes    Beckie Busing, RN 09/03/2023, 3:22 PM

## 2023-09-03 NOTE — Plan of Care (Signed)

## 2023-09-03 NOTE — Plan of Care (Signed)
  Problem: Education: Goal: Knowledge of General Education information will improve Description: Including pain rating scale, medication(s)/side effects and non-pharmacologic comfort measures Outcome: Progressing   Problem: Clinical Measurements: Goal: Ability to maintain clinical measurements within normal limits will improve Outcome: Progressing Goal: Will remain free from infection Outcome: Progressing Goal: Cardiovascular complication will be avoided Outcome: Progressing   Problem: Coping: Goal: Level of anxiety will decrease Outcome: Progressing   Problem: Pain Managment: Goal: General experience of comfort will improve and/or be controlled Outcome: Progressing

## 2023-09-03 NOTE — Progress Notes (Signed)
   09/03/23 1304  TOC Brief Assessment  Insurance and Status Reviewed  Patient has primary care physician Yes Loleta Chance, Earvin Hansen, MD)  Home environment has been reviewed Yes  Prior level of function: Independent  Prior/Current Home Services No current home services  Social Drivers of Health Review SDOH reviewed no interventions necessary  Readmission risk has been reviewed Yes  Transition of care needs no transition of care needs at this time

## 2023-09-03 NOTE — Progress Notes (Signed)
 [facility]                                                                               Morgan Torres, is a 73 y.o. female, DOB - 07-Oct-1950, WUJ:[id number] Admit date - 09/02/2023    Outpatient Primary MD for the patient is [name], MD  LOS - 0  days    Brief summary   Morgan Torres is a 73 y.o. female with medical history significant for metastatic urothelial carcinoma status post right nephro ureterectomy in September 2022, CKD stage IIIa with baseline creatinine range 1.2-1.5, GAD, COPD, acquired hypothyroidism, chronic pain syndrome, anemia of chronic disease associated baseline hemoglobin 9-12, who is admitted to [facility] on 09/02/2023 for symptomatic management relating to allergic reaction associated with erythematous after presenting from home to [facility] ED complaining of allergic reaction.    Assessment & Plan    Assessment and Plan:   Allergic reaction of unclear etiology.  Patient presents with 3 days of oral pruritic rash evolving the flexor aspects of the upper and lower extremities and back.  She had chemotherapy on 3/6 and rash appears to have started on 3/7. No overt mucosal involvement.  She denies any acute respiratory distress.  Symptomatic management with IV solumedrol and Benadryl.    Dehydration:  Gently hydrate.    Metastatic urothelial carcinoma S/p right nephro ureterectomy in September 2022 in the setting of metastatic urothelial carcinoma.  Follow up with oncology as recommended.    Stage IIIa CKD Creatinine to be at baseline.   Generalized anxiety disorder.  Resume hom emeds.     COPD;  No wheezing heard.    Hypothyroidism:  Resume synthroid.     Anemia of chronic disease.  Monitor.   Mild hyponatremia Monitor.     Estimated body mass index is 21.99 kg/m as calculated from the following:   Height as of this encounter: 5\' 8"  (1.727 m).   Weight as of this encounter: 65.6 kg.  Code Status:  full code.  DVT Prophylaxis:  SCDs Start: 09/02/23 1925   Level of Care: Level of care: Med-Surg Family Communication: family  at bedside.   Disposition Plan:     Remains inpatient appropriate:  Pending clinical improvement.   Procedures:  None.   Consultants:   None.   Antimicrobials:   Anti-infectives (From admission, onward)    None        Medications  Scheduled Meds:  aspirin EC  81 mg Oral Daily   cilostazol  50 mg Oral BID   famotidine  20 mg Oral BID   feeding supplement  237 mL Oral BID BM   influenza vaccine adjuvanted  0.5 mL Intramuscular Tomorrow-1000   levothyroxine  88 mcg Oral QAC breakfast   methylPREDNISolone (SOLU-MEDROL) injection  80 mg Intravenous Q12H   nicotine  14 mg Transdermal Daily   polyethylene glycol  17 g Oral Daily   polyvinyl alcohol  2 drop Both Eyes QID   senna-docusate  2 tablet Oral BID   sodium chloride  1 g Oral Daily   Continuous Infusions:  sodium chloride 125 mL/hr at 09/03/23 1139   PRN Meds:.acetaminophen **OR** acetaminophen,  albuterol, diphenhydrAMINE, HYDROmorphone (DILAUDID) injection, LORazepam, melatonin, naLOXone (NARCAN)  injection, nicotine polacrilex, ondansetron (ZOFRAN) IV, oxybutynin, traMADol    Subjective:   Morgan Torres was seen and examined today. Pain and itching not well controlled.  Objective:   Vitals:   09/03/23 0311 09/03/23 0543 09/03/23 1029 09/03/23 1255  BP: (!) 144/73 (!) 149/67 (!) 144/65 (!) 142/79  Pulse: 68 (!) 59 69 72  Resp: 18 20 14 16   Temp: 97.7 F (36.5 C) 97.6 F (36.4 C) (!) 97.5 F (36.4 C)   TempSrc: Oral Oral Oral   SpO2: 100% 98% 93% 94%  Weight:      Height:        Intake/Output Summary (Last 24 hours) at 09/03/2023 1716 Last data filed at 09/03/2023 1700 Gross per 24 hour  Intake 1720 ml  Output --  Net 1720 ml   Filed Weights   09/02/23 2255  Weight: 65.6 kg     Exam General exam: Appears calm and comfortable  Respiratory system: Clear to  auscultation. Respiratory effort normal. Cardiovascular system: S1 & S2 heard, RRR.  Gastrointestinal system: Abdomen is nondistended, soft and nontender.  Central nervous system: Alert and oriented. No focal neurological deficits. Extremities: Symmetric 5 x 5 power. Skin: painful rash on the flexor aspects of the arms and legs and on the buttocks. No weeping or no skin sloughing seen. Psychiatry:Mood & affect appropriate.    Data Reviewed:  I have personally reviewed following labs and imaging studies   CBC Lab Results  Component Value Date   WBC 8.5 09/03/2023   RBC 3.03 (L) 09/03/2023   HGB 10.0 (L) 09/03/2023   HCT 31.4 (L) 09/03/2023   MCV 103.6 (H) 09/03/2023   MCH 33.0 09/03/2023   PLT 311 09/03/2023   MCHC 31.8 09/03/2023   RDW 15.2 09/03/2023   LYMPHSABS 0.9 09/03/2023   MONOABS 0.6 09/03/2023   EOSABS 0.2 09/03/2023   BASOSABS 0.0 09/03/2023     Last metabolic panel Lab Results  Component Value Date   NA 133 (L) 09/03/2023   K 4.4 09/03/2023   CL 103 09/03/2023   CO2 22 09/03/2023   BUN 32 (H) 09/03/2023   CREATININE 1.43 (H) 09/03/2023   GLUCOSE 149 (H) 09/03/2023   GFRNONAA 38 (L) 09/03/2023   GFRAA >60 07/15/2018   CALCIUM 8.2 (L) 09/03/2023   PHOS 4.9 (H) 07/15/2023   PROT 6.5 09/03/2023   ALBUMIN 3.3 (L) 09/03/2023   BILITOT 0.4 09/03/2023   ALKPHOS 64 09/03/2023   AST 35 09/03/2023   ALT 30 09/03/2023   ANIONGAP 8 09/03/2023    CBG (last 3)  No results for input(s): "GLUCAP" in the last 72 hours.    Coagulation Profile: Recent Labs  Lab 09/03/23 0418  INR 1.0     Radiology Studies: No results found.     Kathlen Mody M.D. [facility] 09/03/2023, 5:16 PM  Available via Epic secure chat 7am-7pm After 7 pm, please refer to night coverage provider listed on amion.

## 2023-09-04 DIAGNOSIS — L270 Generalized skin eruption due to drugs and medicaments taken internally: Secondary | ICD-10-CM | POA: Diagnosis not present

## 2023-09-04 DIAGNOSIS — M797 Fibromyalgia: Secondary | ICD-10-CM | POA: Diagnosis not present

## 2023-09-04 DIAGNOSIS — R21 Rash and other nonspecific skin eruption: Secondary | ICD-10-CM | POA: Diagnosis not present

## 2023-09-04 DIAGNOSIS — E039 Hypothyroidism, unspecified: Secondary | ICD-10-CM | POA: Diagnosis not present

## 2023-09-04 DIAGNOSIS — E0781 Sick-euthyroid syndrome: Secondary | ICD-10-CM | POA: Diagnosis not present

## 2023-09-04 DIAGNOSIS — C7910 Secondary malignant neoplasm of unspecified urinary organs: Secondary | ICD-10-CM | POA: Diagnosis not present

## 2023-09-04 DIAGNOSIS — Z7982 Long term (current) use of aspirin: Secondary | ICD-10-CM | POA: Diagnosis not present

## 2023-09-04 DIAGNOSIS — G894 Chronic pain syndrome: Secondary | ICD-10-CM | POA: Diagnosis not present

## 2023-09-04 DIAGNOSIS — N1831 Chronic kidney disease, stage 3a: Secondary | ICD-10-CM | POA: Diagnosis not present

## 2023-09-04 DIAGNOSIS — Z79899 Other long term (current) drug therapy: Secondary | ICD-10-CM | POA: Diagnosis not present

## 2023-09-04 DIAGNOSIS — Z906 Acquired absence of other parts of urinary tract: Secondary | ICD-10-CM | POA: Diagnosis not present

## 2023-09-04 DIAGNOSIS — F411 Generalized anxiety disorder: Secondary | ICD-10-CM | POA: Diagnosis present

## 2023-09-04 DIAGNOSIS — Y9289 Other specified places as the place of occurrence of the external cause: Secondary | ICD-10-CM | POA: Diagnosis not present

## 2023-09-04 DIAGNOSIS — Z8249 Family history of ischemic heart disease and other diseases of the circulatory system: Secondary | ICD-10-CM | POA: Diagnosis not present

## 2023-09-04 DIAGNOSIS — F17210 Nicotine dependence, cigarettes, uncomplicated: Secondary | ICD-10-CM | POA: Diagnosis not present

## 2023-09-04 DIAGNOSIS — I2510 Atherosclerotic heart disease of native coronary artery without angina pectoris: Secondary | ICD-10-CM | POA: Diagnosis not present

## 2023-09-04 DIAGNOSIS — I129 Hypertensive chronic kidney disease with stage 1 through stage 4 chronic kidney disease, or unspecified chronic kidney disease: Secondary | ICD-10-CM | POA: Diagnosis not present

## 2023-09-04 DIAGNOSIS — L308 Other specified dermatitis: Secondary | ICD-10-CM | POA: Diagnosis not present

## 2023-09-04 DIAGNOSIS — E871 Hypo-osmolality and hyponatremia: Secondary | ICD-10-CM | POA: Diagnosis not present

## 2023-09-04 DIAGNOSIS — I739 Peripheral vascular disease, unspecified: Secondary | ICD-10-CM | POA: Diagnosis not present

## 2023-09-04 DIAGNOSIS — T451X5A Adverse effect of antineoplastic and immunosuppressive drugs, initial encounter: Secondary | ICD-10-CM | POA: Diagnosis not present

## 2023-09-04 DIAGNOSIS — N179 Acute kidney failure, unspecified: Secondary | ICD-10-CM | POA: Diagnosis not present

## 2023-09-04 DIAGNOSIS — Z79890 Hormone replacement therapy (postmenopausal): Secondary | ICD-10-CM | POA: Diagnosis not present

## 2023-09-04 DIAGNOSIS — D631 Anemia in chronic kidney disease: Secondary | ICD-10-CM | POA: Diagnosis not present

## 2023-09-04 DIAGNOSIS — J4489 Other specified chronic obstructive pulmonary disease: Secondary | ICD-10-CM | POA: Diagnosis not present

## 2023-09-04 DIAGNOSIS — Z79620 Long term (current) use of immunosuppressive biologic: Secondary | ICD-10-CM | POA: Diagnosis not present

## 2023-09-04 DIAGNOSIS — F32A Depression, unspecified: Secondary | ICD-10-CM | POA: Diagnosis present

## 2023-09-04 DIAGNOSIS — L271 Localized skin eruption due to drugs and medicaments taken internally: Secondary | ICD-10-CM | POA: Diagnosis not present

## 2023-09-04 DIAGNOSIS — Z905 Acquired absence of kidney: Secondary | ICD-10-CM | POA: Diagnosis not present

## 2023-09-04 DIAGNOSIS — I252 Old myocardial infarction: Secondary | ICD-10-CM | POA: Diagnosis not present

## 2023-09-04 DIAGNOSIS — F17200 Nicotine dependence, unspecified, uncomplicated: Secondary | ICD-10-CM | POA: Diagnosis not present

## 2023-09-04 DIAGNOSIS — C689 Malignant neoplasm of urinary organ, unspecified: Secondary | ICD-10-CM | POA: Diagnosis not present

## 2023-09-04 DIAGNOSIS — N289 Disorder of kidney and ureter, unspecified: Secondary | ICD-10-CM | POA: Diagnosis not present

## 2023-09-04 DIAGNOSIS — D630 Anemia in neoplastic disease: Secondary | ICD-10-CM | POA: Diagnosis not present

## 2023-09-04 DIAGNOSIS — J449 Chronic obstructive pulmonary disease, unspecified: Secondary | ICD-10-CM | POA: Diagnosis not present

## 2023-09-04 DIAGNOSIS — Z9221 Personal history of antineoplastic chemotherapy: Secondary | ICD-10-CM | POA: Diagnosis not present

## 2023-09-04 DIAGNOSIS — E860 Dehydration: Secondary | ICD-10-CM | POA: Diagnosis not present

## 2023-09-04 DIAGNOSIS — Z7902 Long term (current) use of antithrombotics/antiplatelets: Secondary | ICD-10-CM | POA: Diagnosis not present

## 2023-09-04 MED ORDER — HYDROCORTISONE 0.5 % EX CREA
TOPICAL_CREAM | Freq: Three times a day (TID) | CUTANEOUS | Status: DC | PRN
  Filled 2023-09-04: qty 28.35

## 2023-09-04 MED ORDER — TRIPLE ANTIBIOTIC 3.5-400-5000 EX OINT
1.0000 | TOPICAL_OINTMENT | Freq: Three times a day (TID) | CUTANEOUS | Status: DC
  Administered 2023-09-04 – 2023-09-05 (×4): 1 via CUTANEOUS
  Filled 2023-09-04: qty 1
  Filled 2023-09-04: qty 3
  Filled 2023-09-04 (×2): qty 1

## 2023-09-04 NOTE — Plan of Care (Signed)

## 2023-09-04 NOTE — Progress Notes (Signed)
 [facility] Progress Note  Patient: Morgan Torres     WUJ:[id number]  DOA: 09/02/2023   PCP: [name], MD       Brief hospital course: 73 year old female with metastatic urothelial carcinoma status post right-sided nephrectomy, chronic kidney disease, COPD, hypothyroidism, anemia of chronic disease who was admitted to [facility] for a rash that was felt to be a drug reaction to chemotherapy which she had received 1 day earlier.  Subjective:  Has pain throughout her body secondary to the rash which is now peeling in multiple places.  Assessment and Plan: Principal Problem:   Rash -   - Patient has extensive discoloration of her skin present and greater than 75% of her body with sloughing of skin in many areas.  Skin is very tender to the touch and there is edema present. - Continue high-dose steroids, Benadryl and Pepcid -Possibly was an allergic reaction to the chemotherapy infusion that she received on 3/6   Active Problems:    Metastatic urothelial carcinoma-2023 -Status post right-sided nephrectomy, resection of superficial tumor in the bladder and intravesical treatment - Receives outpatient palliative chemotherapy now- enfortumab vedotin infusion therapy being given per oncology notes which is likely what she has had this reaction to    Dehydration   CKD stage 3a, GFR 45-59 ml/min (HCC) -Continue IV fluids  Abnormal thyroid functions - TSH 14.013 and free T4 13.3 - May be sick euthyroid syndrome-would recommend outpatient follow-up and will hold off on adjusting her dose of Synthroid for now     Code Status: Full Code Total time on patient care: 35 minutes DVT prophylaxis:  SCDs Start: 09/02/23 1925     Objective:   Vitals:   09/03/23 1029 09/03/23 1255 09/03/23 2027 09/04/23 0324  BP: (!) 144/65 (!) 142/79 (!) 148/67 (!) 175/79  Pulse: 69 72 76 80  Resp: 14 16 18 20   Temp: (!) 97.5 F (36.4 C)  98.2 F (36.8 C) 98.1 F (36.7 C)   TempSrc: Oral  Oral Oral  SpO2: 93% 94% 95% 98%  Weight:      Height:       Filed Weights   09/02/23 2255  Weight: 65.6 kg   Exam: General exam: Appears comfortable  HEENT: oral mucosa moist Respiratory system: Clear to auscultation.  Cardiovascular system: S1 & S2 heard  Gastrointestinal system: Abdomen soft, non-tender, nondistended. Normal bowel sounds   Extremities: No cyanosis, clubbing Skin: Extensive erythema and darkening of skin with sloughing of skin in numerous places-subcutaneous edema also noted Psychiatry:  Mood & affect appropriate.      CBC: Recent Labs  Lab 08/29/23 1424 09/02/23 1806 09/03/23 0418  WBC 5.3 7.9 8.5  NEUTROABS 2.3 6.8 6.6  HGB 11.5* 10.8* 10.0*  HCT 34.7* 33.3* 31.4*  MCV 100.0 102.5* 103.6*  PLT 328 304 311   Basic Metabolic Panel: Recent Labs  Lab 08/29/23 1424 09/02/23 1806 09/03/23 0418  NA 135 137 133*  K 4.4 4.3 4.4  CL 100 105 103  CO2 27 22 22   GLUCOSE 139* 145* 149*  BUN 21 33* 32*  CREATININE 1.53* 1.47* 1.43*  CALCIUM 9.5 9.1 8.2*  MG  --  1.7 1.7     Scheduled Meds:  aspirin EC  81 mg Oral Daily   cilostazol  50 mg Oral BID   famotidine  20 mg Oral BID   feeding supplement  237 mL Oral BID BM   influenza vaccine adjuvanted  0.5 mL Intramuscular Tomorrow-1000  levothyroxine  88 mcg Oral QAC breakfast   methylPREDNISolone (SOLU-MEDROL) injection  80 mg Intravenous Q12H   neomycin-bacitracin-polymyxin  1 Application Apply externally TID   nicotine  14 mg Transdermal Daily   polyethylene glycol  17 g Oral Daily   polyvinyl alcohol  2 drop Both Eyes QID   senna-docusate  2 tablet Oral BID   sodium chloride  1 g Oral Daily    Imaging and lab data personally reviewed   Author: Calvert Cantor  09/04/2023 12:23 PM  To contact [facility]>   Check the care team in [facility] and look for the attending/consulting [facility] provider listed  Log into [email or web address] and use [place]'s universal password   Go to>  "[facility]"  and find provider  If you still have difficulty reaching the provider, please page the [facility] (Director on Call) for the Hospitalists listed on amion

## 2023-09-04 NOTE — Plan of Care (Signed)
  Problem: Clinical Measurements: Goal: Respiratory complications will improve Outcome: Progressing Goal: Cardiovascular complication will be avoided Outcome: Progressing   Problem: Nutrition: Goal: Adequate nutrition will be maintained Outcome: Progressing   Problem: Elimination: Goal: Will not experience complications related to urinary retention Outcome: Progressing   

## 2023-09-04 NOTE — Consult Note (Signed)
 WOC Nurse Consult Note: Reason for Consult: extensive rash in the presence of immunocompromised patient Metastatic urothelial cancer; chemo last 08/29/23; persistent erythematous rash.  Wound type: allergic reaction Pressure Injury POA: NA Measurement:NA Wound bed: no open wounds documented  Drainage (amount, consistency, odor) none  Periwound: intact  Dressing procedure/placement/frequency: In the setting of systemic rash, treatment is outside of the scope of practice unless patient has open wounds related to the rash.   I have suggested Domeboro's solution soaks if [facility] pharmacy caries. These are soak/compress that can be applied to affected areas topically.   [place] to the nurse and the MD to suggest same.   Discussed POC with patient and bedside nurse.  Re consult if needed, will not follow at this time. Thanks  Brandilee Pies [employer], RN,CWOCN, CNS, CWON-AP [phone])

## 2023-09-05 DIAGNOSIS — N1831 Chronic kidney disease, stage 3a: Secondary | ICD-10-CM | POA: Diagnosis not present

## 2023-09-05 DIAGNOSIS — C7910 Secondary malignant neoplasm of unspecified urinary organs: Secondary | ICD-10-CM | POA: Diagnosis not present

## 2023-09-05 DIAGNOSIS — T451X5A Adverse effect of antineoplastic and immunosuppressive drugs, initial encounter: Secondary | ICD-10-CM | POA: Diagnosis not present

## 2023-09-05 DIAGNOSIS — Z905 Acquired absence of kidney: Secondary | ICD-10-CM | POA: Diagnosis not present

## 2023-09-05 DIAGNOSIS — E039 Hypothyroidism, unspecified: Secondary | ICD-10-CM | POA: Diagnosis not present

## 2023-09-05 DIAGNOSIS — Z79620 Long term (current) use of immunosuppressive biologic: Secondary | ICD-10-CM | POA: Diagnosis not present

## 2023-09-05 DIAGNOSIS — I129 Hypertensive chronic kidney disease with stage 1 through stage 4 chronic kidney disease, or unspecified chronic kidney disease: Secondary | ICD-10-CM | POA: Diagnosis not present

## 2023-09-05 DIAGNOSIS — G894 Chronic pain syndrome: Secondary | ICD-10-CM | POA: Diagnosis not present

## 2023-09-05 DIAGNOSIS — D631 Anemia in chronic kidney disease: Secondary | ICD-10-CM | POA: Diagnosis not present

## 2023-09-05 DIAGNOSIS — Z8249 Family history of ischemic heart disease and other diseases of the circulatory system: Secondary | ICD-10-CM | POA: Diagnosis not present

## 2023-09-05 DIAGNOSIS — C689 Malignant neoplasm of urinary organ, unspecified: Secondary | ICD-10-CM | POA: Diagnosis not present

## 2023-09-05 DIAGNOSIS — N289 Disorder of kidney and ureter, unspecified: Secondary | ICD-10-CM | POA: Diagnosis not present

## 2023-09-05 DIAGNOSIS — L270 Generalized skin eruption due to drugs and medicaments taken internally: Secondary | ICD-10-CM | POA: Diagnosis not present

## 2023-09-05 DIAGNOSIS — D630 Anemia in neoplastic disease: Secondary | ICD-10-CM | POA: Diagnosis not present

## 2023-09-05 DIAGNOSIS — Z79899 Other long term (current) drug therapy: Secondary | ICD-10-CM | POA: Diagnosis not present

## 2023-09-05 DIAGNOSIS — J449 Chronic obstructive pulmonary disease, unspecified: Secondary | ICD-10-CM | POA: Diagnosis not present

## 2023-09-05 DIAGNOSIS — F17200 Nicotine dependence, unspecified, uncomplicated: Secondary | ICD-10-CM | POA: Diagnosis not present

## 2023-09-05 DIAGNOSIS — Z906 Acquired absence of other parts of urinary tract: Secondary | ICD-10-CM | POA: Diagnosis not present

## 2023-09-05 DIAGNOSIS — R21 Rash and other nonspecific skin eruption: Secondary | ICD-10-CM | POA: Diagnosis not present

## 2023-09-05 LAB — URINALYSIS, ROUTINE W REFLEX MICROSCOPIC
Bilirubin Urine: NEGATIVE
Glucose, UA: NEGATIVE mg/dL
Hgb urine dipstick: NEGATIVE
Ketones, ur: NEGATIVE mg/dL
Leukocytes,Ua: NEGATIVE
Nitrite: NEGATIVE
Protein, ur: NEGATIVE mg/dL
Specific Gravity, Urine: 1.008 (ref 1.005–1.030)
pH: 5 (ref 5.0–8.0)

## 2023-09-05 LAB — COMPREHENSIVE METABOLIC PANEL
ALT: 23 U/L (ref 0–44)
AST: 21 U/L (ref 15–41)
Albumin: 2.8 g/dL — ABNORMAL LOW (ref 3.5–5.0)
Alkaline Phosphatase: 61 U/L (ref 38–126)
Anion gap: 5 (ref 5–15)
BUN: 28 mg/dL — ABNORMAL HIGH (ref 8–23)
CO2: 22 mmol/L (ref 22–32)
Calcium: 8.1 mg/dL — ABNORMAL LOW (ref 8.9–10.3)
Chloride: 108 mmol/L (ref 98–111)
Creatinine, Ser: 1.09 mg/dL — ABNORMAL HIGH (ref 0.44–1.00)
GFR, Estimated: 53 mL/min — ABNORMAL LOW (ref >60)
Glucose, Bld: 186 mg/dL — ABNORMAL HIGH (ref 70–99)
Potassium: 4.4 mmol/L (ref 3.5–5.1)
Sodium: 135 mmol/L (ref 135–145)
Total Bilirubin: 0.4 mg/dL (ref 0.0–1.2)
Total Protein: 5.4 g/dL — ABNORMAL LOW (ref 6.5–8.1)

## 2023-09-05 LAB — CBC
HCT: 27.3 % — ABNORMAL LOW (ref 36.0–46.0)
Hemoglobin: 8.7 g/dL — ABNORMAL LOW (ref 12.0–15.0)
MCH: 32.7 pg (ref 26.0–34.0)
MCHC: 31.9 g/dL (ref 30.0–36.0)
MCV: 102.6 fL — ABNORMAL HIGH (ref 80.0–100.0)
Platelets: 261 10*3/uL (ref 150–400)
RBC: 2.66 MIL/uL — ABNORMAL LOW (ref 3.87–5.11)
RDW: 15.5 % (ref 11.5–15.5)
WBC: 8.4 10*3/uL (ref 4.0–10.5)
nRBC: 0.2 % (ref 0.0–0.2)

## 2023-09-05 LAB — GLUCOSE, CAPILLARY: Glucose-Capillary: 160 mg/dL — ABNORMAL HIGH (ref 70–99)

## 2023-09-05 LAB — FOLATE: Folate: 4.7 ng/mL — ABNORMAL LOW (ref >5.9)

## 2023-09-05 LAB — VITAMIN B12: Vitamin B-12: 5124 pg/mL — ABNORMAL HIGH (ref 180–914)

## 2023-09-05 MED ORDER — METHYLPREDNISOLONE SODIUM SUCC 125 MG IJ SOLR: 80.0000 mg | INTRAMUSCULAR | Status: DC

## 2023-09-05 MED ORDER — HALOPERIDOL LACTATE 5 MG/ML IJ SOLN
1.0000 mg | Freq: Once | INTRAMUSCULAR | Status: AC
  Administered 2023-09-05: 1 mg via INTRAVENOUS
  Filled 2023-09-05: qty 1

## 2023-09-05 MED ORDER — CHLORHEXIDINE GLUCONATE CLOTH 2 % EX PADS: 6.0000 | MEDICATED_PAD | Freq: Every day | CUTANEOUS | Status: DC

## 2023-09-05 MED ORDER — FOLIC ACID 1 MG PO TABS
1.0000 mg | ORAL_TABLET | Freq: Every day | ORAL | Status: DC
  Administered 2023-09-05: 1 mg via ORAL
  Filled 2023-09-05: qty 1

## 2023-09-05 MED ORDER — HYDROMORPHONE HCL 1 MG/ML IJ SOLN
1.0000 mg | INTRAMUSCULAR | Status: DC | PRN
Start: 2023-09-05 — End: 2023-11-05
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
## 2023-09-05 NOTE — Progress Notes (Addendum)
    Patient Name: Morgan Torres           DOB: [birth year]  MRN: [id number]      Admission Date: 09/02/2023  Attending Provider: Calvert Cantor, MD  Primary Diagnosis: Rash and nonspecific skin eruption   Level of care: Med-Surg    CROSS COVER NOTE   Date of Service   09/05/2023   Morgan Torres, 73 y.o. female, was admitted on 09/02/2023 for Rash and nonspecific skin eruption.    HPI/Events of Note   Altered mental status Patient exhibiting new onset of confusion and agitation.  Per nursing staff, patient woke up yelling wanting to use the restroom.  Afterwards she became increasingly agitated, wanting to go home.   Vital signs stable, CBG WNL.  Previously A/O x 4, now only oriented to self.   No other neuro deficit noted.  Physical assessment is limited as she is uncooperative and attempting to strike staff.  Speech is clear, face symmetrical, moving all extremities without complication. Received Benadryl, Dilaudid, Ativan in the past 24 hours.  The use of benzos and opioids can increase risk of confusion, delirium.  Addendum- Husband at bedside, attempting to help reorient patient. Per husband, patient does not consume alcohol.   Interventions/ Plan   Work up-->  UA, folic acid, thiamine, vitamin B12, CMP Avoid polypharmacy and deliriogenic medications when possible Delirium precautions, frequently reorient patient        [name], DNP, ACNPC- AG [facility] [place]

## 2023-09-05 NOTE — Progress Notes (Signed)
 Pt has transfer order and will be sent to [facility] 7th floor Room C72, admitting doctor Dr. Doylene Canning. Report was called in and given to [facility] ( Floor number [phone] ). This RN was told to keep the port accessed and foley in. [facility] has been set-up at 1741. MD was informed of the EMTALA papers. Transfer Center # [phone].

## 2023-09-05 NOTE — Progress Notes (Signed)
 Pt is reporting voiding but with no output. MD was notified and ordered in&out cath, but 2 RN were unsuccessful. MD was notified and ordered foley catheter. Foley cath was inserted. IV pain med was given while this process was done.
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
## 2023-09-05 NOTE — Progress Notes (Signed)
 RN rounding on patient, patient found awake and yelling "hey!, hey!.". Patient then asked to be helped to the bsc commode, upon returning back to bed from bedside commode patient began to ask for her shoes so she could go home and get in bed. RN informed patient that she was in hospital and already in bed. Patient became increasingly agitated and started yelling for people that were not present. Vitals signs and cbg obtained and were in WNL. See flowsheet. RN asked patient for her name and date of birthday which patient provided the correct answer to this question. RN then asked patient for year and current president and patient responded 1973 and Dossie Der. RN also asked patient if she knew where she currently was, patient answer "hell if I know." RN contacted on call provider for new acute confusion as patient has previously been alert and oritened x4.   Patient increasingly agitated, yelling at staff, and calling multiple family members stating that the staff are holding her hostage. RN spoke with patient's spouse and he has agreed to come and sit with patient. On call provider placed order for IV haldol for agitation. Administered see MAR.  Patient currently alert but confused. Equal chest rise and fall. Door open. Bed alarm set.

## 2023-09-05 NOTE — Plan of Care (Signed)

## 2023-09-06 DIAGNOSIS — R21 Rash and other nonspecific skin eruption: Secondary | ICD-10-CM | POA: Diagnosis not present

## 2023-09-07 DIAGNOSIS — R21 Rash and other nonspecific skin eruption: Secondary | ICD-10-CM | POA: Diagnosis not present

## 2023-09-08 DIAGNOSIS — R21 Rash and other nonspecific skin eruption: Secondary | ICD-10-CM | POA: Diagnosis not present

## 2023-09-09 DIAGNOSIS — R21 Rash and other nonspecific skin eruption: Secondary | ICD-10-CM | POA: Diagnosis not present

## 2023-09-09 LAB — VITAMIN B1: Vitamin B1 (Thiamine): 72.3 nmol/L (ref 66.5–200.0)

## 2023-09-10 DIAGNOSIS — L270 Generalized skin eruption due to drugs and medicaments taken internally: Secondary | ICD-10-CM | POA: Diagnosis not present

## 2023-09-11 ENCOUNTER — Telehealth: Payer: Self-pay

## 2023-09-11 DIAGNOSIS — L270 Generalized skin eruption due to drugs and medicaments taken internally: Secondary | ICD-10-CM | POA: Diagnosis not present
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
## 2023-09-12 ENCOUNTER — Inpatient Hospital Stay: Payer: Medicare Other | Admitting: Internal Medicine

## 2023-09-12 ENCOUNTER — Inpatient Hospital Stay: Payer: Medicare Other

## 2023-09-12 ENCOUNTER — Other Ambulatory Visit: Payer: Medicare Other

## 2023-09-12 DIAGNOSIS — R339 Retention of urine, unspecified: Secondary | ICD-10-CM | POA: Diagnosis not present

## 2023-09-12 DIAGNOSIS — R21 Rash and other nonspecific skin eruption: Secondary | ICD-10-CM | POA: Diagnosis not present

## 2023-09-12 DIAGNOSIS — Z7409 Other reduced mobility: Secondary | ICD-10-CM | POA: Diagnosis not present

## 2023-09-12 DIAGNOSIS — R531 Weakness: Secondary | ICD-10-CM | POA: Diagnosis not present

## 2023-09-12 DIAGNOSIS — G629 Polyneuropathy, unspecified: Secondary | ICD-10-CM | POA: Diagnosis not present

## 2023-09-12 DIAGNOSIS — G8929 Other chronic pain: Secondary | ICD-10-CM | POA: Diagnosis not present

## 2023-09-12 DIAGNOSIS — R234 Changes in skin texture: Secondary | ICD-10-CM | POA: Diagnosis not present

## 2023-09-12 DIAGNOSIS — E875 Hyperkalemia: Secondary | ICD-10-CM | POA: Diagnosis not present

## 2023-09-12 DIAGNOSIS — T451X5D Adverse effect of antineoplastic and immunosuppressive drugs, subsequent encounter: Secondary | ICD-10-CM | POA: Diagnosis not present

## 2023-09-12 DIAGNOSIS — J4489 Other specified chronic obstructive pulmonary disease: Secondary | ICD-10-CM | POA: Diagnosis not present

## 2023-09-12 DIAGNOSIS — E039 Hypothyroidism, unspecified: Secondary | ICD-10-CM | POA: Diagnosis not present

## 2023-09-12 DIAGNOSIS — N179 Acute kidney failure, unspecified: Secondary | ICD-10-CM | POA: Diagnosis not present

## 2023-09-12 DIAGNOSIS — K5904 Chronic idiopathic constipation: Secondary | ICD-10-CM | POA: Diagnosis not present

## 2023-09-12 DIAGNOSIS — Z8551 Personal history of malignant neoplasm of bladder: Secondary | ICD-10-CM | POA: Diagnosis not present

## 2023-09-12 DIAGNOSIS — L270 Generalized skin eruption due to drugs and medicaments taken internally: Secondary | ICD-10-CM | POA: Diagnosis not present

## 2023-09-12 DIAGNOSIS — R5381 Other malaise: Secondary | ICD-10-CM | POA: Diagnosis not present

## 2023-09-12 DIAGNOSIS — I2510 Atherosclerotic heart disease of native coronary artery without angina pectoris: Secondary | ICD-10-CM | POA: Diagnosis not present

## 2023-09-12 DIAGNOSIS — N183 Chronic kidney disease, stage 3 unspecified: Secondary | ICD-10-CM | POA: Diagnosis not present

## 2023-09-12 DIAGNOSIS — Z905 Acquired absence of kidney: Secondary | ICD-10-CM | POA: Diagnosis not present

## 2023-09-12 DIAGNOSIS — I129 Hypertensive chronic kidney disease with stage 1 through stage 4 chronic kidney disease, or unspecified chronic kidney disease: Secondary | ICD-10-CM | POA: Diagnosis not present

## 2023-09-12 DIAGNOSIS — I739 Peripheral vascular disease, unspecified: Secondary | ICD-10-CM | POA: Diagnosis not present

## 2023-09-12 DIAGNOSIS — Z736 Limitation of activities due to disability: Secondary | ICD-10-CM | POA: Diagnosis not present

## 2023-09-12 DIAGNOSIS — E871 Hypo-osmolality and hyponatremia: Secondary | ICD-10-CM | POA: Diagnosis not present

## 2023-09-12 DIAGNOSIS — R2689 Other abnormalities of gait and mobility: Secondary | ICD-10-CM | POA: Diagnosis not present

## 2023-09-12 DIAGNOSIS — Z789 Other specified health status: Secondary | ICD-10-CM | POA: Diagnosis not present

## 2023-09-13 DIAGNOSIS — E875 Hyperkalemia: Secondary | ICD-10-CM | POA: Diagnosis not present

## 2023-09-13 DIAGNOSIS — E871 Hypo-osmolality and hyponatremia: Secondary | ICD-10-CM | POA: Diagnosis not present

## 2023-09-13 DIAGNOSIS — Z7409 Other reduced mobility: Secondary | ICD-10-CM | POA: Diagnosis not present

## 2023-09-13 DIAGNOSIS — I739 Peripheral vascular disease, unspecified: Secondary | ICD-10-CM | POA: Diagnosis not present

## 2023-09-13 DIAGNOSIS — Z789 Other specified health status: Secondary | ICD-10-CM | POA: Diagnosis not present

## 2023-09-13 DIAGNOSIS — R5381 Other malaise: Secondary | ICD-10-CM | POA: Diagnosis not present

## 2023-09-16 ENCOUNTER — Encounter: Payer: Self-pay | Admitting: Internal Medicine

## 2023-09-16 DIAGNOSIS — I739 Peripheral vascular disease, unspecified: Secondary | ICD-10-CM | POA: Diagnosis not present

## 2023-09-16 DIAGNOSIS — E871 Hypo-osmolality and hyponatremia: Secondary | ICD-10-CM | POA: Diagnosis not present

## 2023-09-16 DIAGNOSIS — E875 Hyperkalemia: Secondary | ICD-10-CM | POA: Diagnosis not present

## 2023-09-16 DIAGNOSIS — Z7409 Other reduced mobility: Secondary | ICD-10-CM | POA: Diagnosis not present

## 2023-09-16 DIAGNOSIS — R5381 Other malaise: Secondary | ICD-10-CM | POA: Diagnosis not present

## 2023-09-16 DIAGNOSIS — Z789 Other specified health status: Secondary | ICD-10-CM | POA: Diagnosis not present

## 2023-09-17 DIAGNOSIS — E875 Hyperkalemia: Secondary | ICD-10-CM | POA: Diagnosis not present

## 2023-09-17 DIAGNOSIS — R5381 Other malaise: Secondary | ICD-10-CM | POA: Diagnosis not present

## 2023-09-17 DIAGNOSIS — Z789 Other specified health status: Secondary | ICD-10-CM | POA: Diagnosis not present

## 2023-09-17 DIAGNOSIS — Z7409 Other reduced mobility: Secondary | ICD-10-CM | POA: Diagnosis not present

## 2023-09-17 DIAGNOSIS — E871 Hypo-osmolality and hyponatremia: Secondary | ICD-10-CM | POA: Diagnosis not present

## 2023-09-17 DIAGNOSIS — I739 Peripheral vascular disease, unspecified: Secondary | ICD-10-CM | POA: Diagnosis not present

## 2023-09-18 DIAGNOSIS — Z789 Other specified health status: Secondary | ICD-10-CM | POA: Diagnosis not present

## 2023-09-18 DIAGNOSIS — E871 Hypo-osmolality and hyponatremia: Secondary | ICD-10-CM | POA: Diagnosis not present

## 2023-09-18 DIAGNOSIS — E875 Hyperkalemia: Secondary | ICD-10-CM | POA: Diagnosis not present

## 2023-09-18 DIAGNOSIS — Z7409 Other reduced mobility: Secondary | ICD-10-CM | POA: Diagnosis not present

## 2023-09-18 DIAGNOSIS — R5381 Other malaise: Secondary | ICD-10-CM | POA: Diagnosis not present

## 2023-09-18 DIAGNOSIS — I739 Peripheral vascular disease, unspecified: Secondary | ICD-10-CM | POA: Diagnosis not present

## 2023-09-19 ENCOUNTER — Inpatient Hospital Stay: Admitting: Licensed Clinical Social Worker

## 2023-09-19 ENCOUNTER — Ambulatory Visit: Payer: Medicare Other

## 2023-09-19 ENCOUNTER — Encounter: Payer: Medicare Other | Admitting: Dietician

## 2023-09-19 ENCOUNTER — Other Ambulatory Visit: Payer: Medicare Other

## 2023-09-19 DIAGNOSIS — E871 Hypo-osmolality and hyponatremia: Secondary | ICD-10-CM | POA: Diagnosis not present

## 2023-09-19 DIAGNOSIS — E875 Hyperkalemia: Secondary | ICD-10-CM | POA: Diagnosis not present

## 2023-09-19 DIAGNOSIS — I739 Peripheral vascular disease, unspecified: Secondary | ICD-10-CM | POA: Diagnosis not present

## 2023-09-19 DIAGNOSIS — R5381 Other malaise: Secondary | ICD-10-CM | POA: Diagnosis not present

## 2023-09-19 DIAGNOSIS — Z789 Other specified health status: Secondary | ICD-10-CM | POA: Diagnosis not present

## 2023-09-19 DIAGNOSIS — Z7409 Other reduced mobility: Secondary | ICD-10-CM | POA: Diagnosis not present

## 2023-09-19 DIAGNOSIS — C7800 Secondary malignant neoplasm of unspecified lung: Secondary | ICD-10-CM
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
## 2023-09-20 DIAGNOSIS — E875 Hyperkalemia: Secondary | ICD-10-CM | POA: Diagnosis not present

## 2023-09-20 DIAGNOSIS — E871 Hypo-osmolality and hyponatremia: Secondary | ICD-10-CM | POA: Diagnosis not present

## 2023-09-20 DIAGNOSIS — Z7409 Other reduced mobility: Secondary | ICD-10-CM | POA: Diagnosis not present

## 2023-09-20 DIAGNOSIS — R5381 Other malaise: Secondary | ICD-10-CM | POA: Diagnosis not present

## 2023-09-20 DIAGNOSIS — Z789 Other specified health status: Secondary | ICD-10-CM | POA: Diagnosis not present

## 2023-09-20 DIAGNOSIS — I739 Peripheral vascular disease, unspecified: Secondary | ICD-10-CM | POA: Diagnosis not present

## 2023-09-22 ENCOUNTER — Other Ambulatory Visit: Payer: Self-pay

## 2023-09-23 DIAGNOSIS — R5381 Other malaise: Secondary | ICD-10-CM | POA: Diagnosis not present

## 2023-09-24 ENCOUNTER — Telehealth: Payer: Self-pay | Admitting: Medical Oncology

## 2023-09-24 NOTE — Telephone Encounter (Signed)
 Released from [facility] today .    When is her next appt with Dr. Arbutus Ped?    Requested  refill for " fungal medication". She will call pharmacy for the name of med and prescriber.

## 2023-09-25 ENCOUNTER — Telehealth: Payer: Self-pay

## 2023-09-25 NOTE — Telephone Encounter (Signed)
 TC from pt, asking about her appointments tomorrow. She states she was recently hospitalized at [facility] due to a reaction to chemo. Informed of all appts tomorrow and advised that Dr. Shirline Frees would discuss her recent problems related to chemo at the [facility]. Pt also reports that her PAC site is extremely sore. She states that it was changed twice during hospital stay, and pain to the area has gotten progressively worse. Pt states the PAC site is currently covered by a bandage, so she is unsure if it is red or swollen. Advised to evaluate site for s/s of infection (redness, swelling, discharge) and to report at her appt tomorrow, and meanwhile, go to the ED if she develops a fever, N/V, and/or other significant symptoms. Advised to apply ice to the area to help relieve pain. She verbalizes understanding.

## 2023-09-26 ENCOUNTER — Encounter: Payer: Self-pay | Admitting: Medical Oncology

## 2023-09-26 ENCOUNTER — Inpatient Hospital Stay: Payer: Medicare Other | Attending: Internal Medicine

## 2023-09-26 ENCOUNTER — Inpatient Hospital Stay: Payer: Medicare Other

## 2023-09-26 ENCOUNTER — Inpatient Hospital Stay: Payer: Medicare Other | Attending: Internal Medicine | Admitting: Internal Medicine

## 2023-09-26 VITALS — BP 139/75 | HR 73 | Temp 97.4°F | Resp 18 | Ht 68.0 in | Wt 162.0 lb

## 2023-09-26 DIAGNOSIS — Z8701 Personal history of pneumonia (recurrent): Secondary | ICD-10-CM | POA: Diagnosis not present

## 2023-09-26 DIAGNOSIS — Z5112 Encounter for antineoplastic immunotherapy: Secondary | ICD-10-CM | POA: Diagnosis present

## 2023-09-26 DIAGNOSIS — I252 Old myocardial infarction: Secondary | ICD-10-CM | POA: Insufficient documentation

## 2023-09-26 DIAGNOSIS — C679 Malignant neoplasm of bladder, unspecified: Secondary | ICD-10-CM

## 2023-09-26 DIAGNOSIS — Z885 Allergy status to narcotic agent status: Secondary | ICD-10-CM | POA: Diagnosis not present

## 2023-09-26 DIAGNOSIS — I2510 Atherosclerotic heart disease of native coronary artery without angina pectoris: Secondary | ICD-10-CM | POA: Insufficient documentation

## 2023-09-26 DIAGNOSIS — Z9226 Personal history of immune checkpoint inhibitor therapy: Secondary | ICD-10-CM | POA: Insufficient documentation

## 2023-09-26 DIAGNOSIS — G4730 Sleep apnea, unspecified: Secondary | ICD-10-CM | POA: Diagnosis not present

## 2023-09-26 DIAGNOSIS — C7800 Secondary malignant neoplasm of unspecified lung: Secondary | ICD-10-CM | POA: Insufficient documentation

## 2023-09-26 DIAGNOSIS — Z95828 Presence of other vascular implants and grafts: Secondary | ICD-10-CM

## 2023-09-26 DIAGNOSIS — I700 Atherosclerosis of aorta: Secondary | ICD-10-CM | POA: Diagnosis not present

## 2023-09-26 DIAGNOSIS — R590 Localized enlarged lymph nodes: Secondary | ICD-10-CM | POA: Insufficient documentation

## 2023-09-26 DIAGNOSIS — N1831 Chronic kidney disease, stage 3a: Secondary | ICD-10-CM | POA: Diagnosis not present

## 2023-09-26 DIAGNOSIS — Z79890 Hormone replacement therapy (postmenopausal): Secondary | ICD-10-CM | POA: Diagnosis not present

## 2023-09-26 DIAGNOSIS — Z9889 Other specified postprocedural states: Secondary | ICD-10-CM | POA: Diagnosis not present

## 2023-09-26 DIAGNOSIS — Z79899 Other long term (current) drug therapy: Secondary | ICD-10-CM | POA: Diagnosis not present

## 2023-09-26 DIAGNOSIS — E1122 Type 2 diabetes mellitus with diabetic chronic kidney disease: Secondary | ICD-10-CM | POA: Insufficient documentation

## 2023-09-26 DIAGNOSIS — D6481 Anemia due to antineoplastic chemotherapy: Secondary | ICD-10-CM

## 2023-09-26 DIAGNOSIS — J439 Emphysema, unspecified: Secondary | ICD-10-CM | POA: Diagnosis not present

## 2023-09-26 DIAGNOSIS — Z881 Allergy status to other antibiotic agents status: Secondary | ICD-10-CM | POA: Diagnosis not present

## 2023-09-26 DIAGNOSIS — L513 Stevens-Johnson syndrome-toxic epidermal necrolysis overlap syndrome: Secondary | ICD-10-CM | POA: Diagnosis not present

## 2023-09-26 DIAGNOSIS — Z9089 Acquired absence of other organs: Secondary | ICD-10-CM | POA: Insufficient documentation

## 2023-09-26 DIAGNOSIS — C771 Secondary and unspecified malignant neoplasm of intrathoracic lymph nodes: Secondary | ICD-10-CM | POA: Insufficient documentation

## 2023-09-26 DIAGNOSIS — E039 Hypothyroidism, unspecified: Secondary | ICD-10-CM

## 2023-09-26 DIAGNOSIS — C7910 Secondary malignant neoplasm of unspecified urinary organs: Secondary | ICD-10-CM

## 2023-09-26 DIAGNOSIS — E871 Hypo-osmolality and hyponatremia: Secondary | ICD-10-CM | POA: Insufficient documentation

## 2023-09-26 DIAGNOSIS — R21 Rash and other nonspecific skin eruption: Secondary | ICD-10-CM | POA: Insufficient documentation

## 2023-09-26 DIAGNOSIS — Z7902 Long term (current) use of antithrombotics/antiplatelets: Secondary | ICD-10-CM | POA: Diagnosis not present

## 2023-09-26 LAB — CMP (CANCER CENTER ONLY)
ALT: 21 U/L (ref 0–44)
AST: 17 U/L (ref 15–41)
Albumin: 3.8 g/dL (ref 3.5–5.0)
Alkaline Phosphatase: 64 U/L (ref 38–126)
Anion gap: 10 (ref 5–15)
BUN: 19 mg/dL (ref 8–23)
CO2: 22 mmol/L (ref 22–32)
Calcium: 8.5 mg/dL — ABNORMAL LOW (ref 8.9–10.3)
Chloride: 101 mmol/L (ref 98–111)
Creatinine: 1.37 mg/dL — ABNORMAL HIGH (ref 0.44–1.00)
GFR, Estimated: 41 mL/min — ABNORMAL LOW (ref >60)
Glucose, Bld: 209 mg/dL — ABNORMAL HIGH (ref 70–99)
Potassium: 4.4 mmol/L (ref 3.5–5.1)
Sodium: 133 mmol/L — ABNORMAL LOW (ref 135–145)
Total Bilirubin: 0.3 mg/dL (ref 0.0–1.2)
Total Protein: 6.4 g/dL — ABNORMAL LOW (ref 6.5–8.1)

## 2023-09-26 LAB — CBC WITH DIFFERENTIAL (CANCER CENTER ONLY)
Abs Immature Granulocytes: 0.06 10*3/uL (ref 0.00–0.07)
Basophils Absolute: 0 10*3/uL (ref 0.0–0.1)
Basophils Relative: 0 %
Eosinophils Absolute: 0 10*3/uL (ref 0.0–0.5)
Eosinophils Relative: 0 %
HCT: 32.5 % — ABNORMAL LOW (ref 36.0–46.0)
Hemoglobin: 10.7 g/dL — ABNORMAL LOW (ref 12.0–15.0)
Immature Granulocytes: 1 %
Lymphocytes Relative: 17 %
Lymphs Abs: 1.7 10*3/uL (ref 0.7–4.0)
MCH: 33.6 pg (ref 26.0–34.0)
MCHC: 32.9 g/dL (ref 30.0–36.0)
MCV: 102.2 fL — ABNORMAL HIGH (ref 80.0–100.0)
Monocytes Absolute: 0.6 10*3/uL (ref 0.1–1.0)
Monocytes Relative: 6 %
Neutro Abs: 7.7 10*3/uL (ref 1.7–7.7)
Neutrophils Relative %: 76 %
Platelet Count: 306 10*3/uL (ref 150–400)
RBC: 3.18 MIL/uL — ABNORMAL LOW (ref 3.87–5.11)
RDW: 18 % — ABNORMAL HIGH (ref 11.5–15.5)
WBC Count: 10 10*3/uL (ref 4.0–10.5)
nRBC: 0 % (ref 0.0–0.2)

## 2023-09-26 LAB — SAMPLE TO BLOOD BANK

## 2023-09-26 LAB — TSH: TSH: 8.833 u[IU]/mL — ABNORMAL HIGH (ref 0.350–4.500)

## 2023-09-26 MED ORDER — SODIUM CHLORIDE 0.9% FLUSH
10.0000 mL | Freq: Once | INTRAVENOUS | Status: AC
  Administered 2023-09-26: 10 mL

## 2023-09-26 NOTE — Progress Notes (Signed)
 [facility] Telephone:([phone]) [phone]   Fax:([phone]) [phone]  OFFICE PROGRESS NOTE  [name], MD [address] [place] [address]  DIAGNOSIS: Metastatic urothelial carcinoma that was initially diagnosed as stage II (T2, N0, M0) in September 2022 status post right nephroureterectomy as well as intravesical treatment and resection of superficial tumor in the bladder several times and March 2023 as well as July 2023.  The patient was found to have enlarging and new pulmonary nodules consistent with metastatic disease in November 2023.    PRIOR THERAPY: 1) Right nephroureterectomy as well as intravesical treatment and resection of superficial tumor in the bladder several times and March 2023 as well as July 2023.  2) Palliative systemic chemotherapy with carboplatin for AUC of 5 on day 1 and gemcitabine 1000 mg/M2 on days 1 and 8 every 3 weeks. First cycle June 21, 2022. Status post 6 cycles. Her dose of carboplatin was reduced to AUC of 4 and gemcitabine to 800 mg/m2 starting from cycle #5 due to cytopenias. 3) Maintenance immunotherapy with Avelumab 800 mg IV every 2 weeks. First dose on 11/15/22. Status post 6 cycle of treatment. 4) Erdafitinib (balversa) 8 mg p.o daily. First dose 02/22/23. Status post 4 months of treatment. Discontinued in February 2025 due to disease progression.    CURRENT THERAPY: enfortumab vedotin infusion therapy: once a week for three weeks, followed by one week off, repeated every four weeks. First dose was on 08/08/2023.  Status post 1 cycle.  This treatment was discontinued in early March 2025 secondary to significant skin rash and concerned about Trudie Buckler syndrome and tissue epidermal necrolysis  INTERVAL HISTORY: Morgan Torres 73 y.o. female returns to the clinic today for follow-up visit. Discussed the use of AI scribe software for clinical note transcription with the patient, who gave verbal consent to proceed.  History of  Present Illness   Morgan Torres is a 73 year old female with metastatic urothelial carcinoma who presents with a severe skin reaction following enfortumab vedotin treatment.  Following the initiation of enfortumab vedotin, she developed a significant skin reaction characterized by a rash and skin peeling, which required hospitalization in a burn unit for approximately three weeks. She describes the experience as 'horrible' and notes that the skin reaction felt like a 'burn from the inside out.' She received treatment with steroids, which have helped improve her appetite and overall feeling of well-being. Prior to the severe reaction, she experienced mild itching at night after starting the treatment, which was initially relieved by Benadryl. However, the symptoms intensified rapidly over a weekend, leading to the severe rash.  She has a history of metastatic urothelial carcinoma, initially diagnosed as stage II in September 2022, which progressed to stage IV by March 2023. She has undergone multiple treatments including palliative systemic chemotherapy with carboplatin and gemcitabine, immunotherapy with nivolumab, oral targeted therapy with erdafitinib, and most recently, enfortumab vedotin.  She was discharged home two days ago and reports feeling better, stating, 'I don't know when I felt this good before.' She notes an improvement in her ability to stand and eat, attributing some of this to the effects of steroids. She has also noticed some weight gain.        MEDICAL HISTORY: Past Medical History:  Diagnosis Date   Anxiety    Arthritis    hip, lumbar spine    Asthma    seasonal allergies    Bronchitis  Hx: of   Chronic kidney disease    COPD (chronic obstructive pulmonary disease) (HCC)    Coronary artery disease    Depression    Fibromyalgia    Hypertension    Lumbar herniated disc    Metastatic urothelial carcinoma (HCC)    Myocardial infarction (HCC) 06/25/2012   followed by  Dr. Rennis Golden, treated medically, no stents   Peripheral arterial disease (HCC)    of right foot   Pneumonia    Hx: of yrs ago   Sciatica    Sleep apnea    Small bowel obstruction (HCC)    several yrs ago    ALLERGIES:  is allergic to diclofenac, ciprofloxacin, codeine, and hydrocodone.  MEDICATIONS:  Current Outpatient Medications  Medication Sig Dispense Refill   acetaminophen (TYLENOL) 500 MG tablet Take 1 tablet (500 mg total) by mouth every 6 (six) hours as needed for mild pain. 30 tablet 0   albuterol (PROVENTIL HFA;VENTOLIN HFA) 108 (90 Base) MCG/ACT inhaler Inhale 2 puffs into the lungs every 6 (six) hours as needed for wheezing or shortness of breath. 1 Inhaler 0   aspirin EC 81 MG tablet Take 81 mg by mouth daily. Swallow whole.     Carboxymethylcell-Glycerin PF 0.5-0.9 % SOLN Place 2 drops into both eyes 4 (four) times daily. 1 each 11   cilostazol (PLETAL) 50 MG tablet Take 50 mg by mouth 2 (two) times daily.     clotrimazole (LOTRIMIN) 1 % external solution Apply topically 2 (two) times daily. 30 mL 0   EPINEPHrine (EPIPEN 2-PAK) 0.3 mg/0.3 mL DEVI Inject 0.3 mLs (0.3 mg total) into the muscle once as needed (for severe allergic reaction). CAll 911 immediately if you have to use this medicine (Patient taking differently: Inject 0.3 mg into the muscle once as needed (for a severe allergic reaction- CALL 9-1-1 IMMEDIATELY IF USED).) 1 Device 1   guaiFENesin (MUCINEX) 600 MG 12 hr tablet Take 2 tablets (1,200 mg total) by mouth 2 (two) times daily. (Patient taking differently: Take 1,200 mg by mouth 2 (two) times daily as needed for to loosen phlegm.) 120 tablet 0   HYDROmorphone (DILAUDID) 1 MG/ML injection Inject 1 mL (1 mg total) into the vein every 2 (two) hours as needed for severe pain (pain score 7-10).     levothyroxine (SYNTHROID) 88 MCG tablet Take 1 tablet (88 mcg total) by mouth daily before breakfast. 90 tablet 2   lidocaine-prilocaine (EMLA) cream Apply 1 Application  topically as needed. (Patient taking differently: Apply 1 Application topically as needed (for port access).) 30 g 2   LORazepam (ATIVAN) 0.5 MG tablet Take 0.5 mg by mouth daily as needed for anxiety.     methylPREDNISolone sodium succinate (SOLU-MEDROL) 125 mg/2 mL injection Inject 1.28 mLs (80 mg total) into the vein daily.     MILK OF MAGNESIA 400 MG/5ML suspension Take 15-30 mLs by mouth daily as needed for mild constipation.     nicotine (NICODERM CQ - DOSED IN MG/24 HOURS) 21 mg/24hr patch Place 21 mg onto the skin daily.     nicotine polacrilex (NICORETTE) 4 MG gum Take 4 mg by mouth as needed for smoking cessation.     nitroGLYCERIN (NITROSTAT) 0.4 MG SL tablet Place 1 tablet (0.4 mg total) under the tongue every 5 (five) minutes x 3 doses as needed for chest pain. 25 tablet 2   ondansetron (ZOFRAN) 8 MG tablet Take 1 tablet (8 mg total) by mouth every 8 (eight) hours as needed  for nausea or vomiting. 30 tablet 1   oxybutynin (DITROPAN) 5 MG tablet Take 1 tablet (5 mg total) by mouth every 8 (eight) hours as needed for bladder spasms. (Patient not taking: Reported on 09/02/2023) 30 tablet 1   polyethylene glycol (MIRALAX / GLYCOLAX) 17 g packet Take 17 g by mouth as needed for mild constipation (mix as directed).     sodium chloride 1 g tablet Take 2 tablets (2 g total) by mouth 2 (two) times daily with a meal. (Patient taking differently: Take 1 g by mouth See admin instructions. Split 1 gram into two halves and take by mouth once a day) 60 tablet 0   temazepam (RESTORIL) 15 MG capsule Take 1 capsule (15 mg total) by mouth at bedtime as needed for sleep. 30 capsule 0   traMADol (ULTRAM) 50 MG tablet Take 50 mg by mouth every 6 (six) hours as needed (for pain).     No current facility-administered medications for this visit.    SURGICAL HISTORY:  Past Surgical History:  Procedure Laterality Date   ABDOMINAL SURGERY     resection of small intestine   ANTERIOR CRUCIATE LIGAMENT REPAIR  Bilateral    BACK SURGERY     fusion of lumbar   CARDIAC CATHETERIZATION  06/25/2012   COLON SURGERY     resection for bowel - for obstruction  6 to 7 yrs ago per pt on 01-05-2022   COLONOSCOPY     Hx; of   DILATION AND CURETTAGE OF UTERUS     EYE SURGERY Bilateral    cataracts removed   HERNIA REPAIR  02/23/1969   inguinal hernia    IR IMAGING GUIDED PORT INSERTION  06/07/2022   LEFT HEART CATHETERIZATION WITH CORONARY ANGIOGRAM N/A 03/30/2013   Procedure: LEFT HEART CATHETERIZATION WITH CORONARY ANGIOGRAM;  Surgeon: Marykay Lex, MD;  Location: [facility] CATH LAB;  Service: Cardiovascular;  Laterality: N/A;   left thumb surgery     yrs ago   ROBOT ASSITED LAPAROSCOPIC NEPHROURETERECTOMY Left 03/09/2021   Procedure: XI ROBOT ASSITED LAPAROSCOPIC NEPHROURETERECTOMY/ CYSTOSCOPY WITH LEFT URETEROSCOPY WITH TRANSURETHRAL RESECTION OF URETERAL ORIFICE;  Surgeon: Rene Paci, MD;  Location: [facility] ORS;  Service: Urology;  Laterality: Left;   SALIVARY GLAND SURGERY Left    approached from inside mouth & side of neck 1970-1980   TONSILLECTOMY     as child   TOTAL HIP ARTHROPLASTY Left 05/28/2014   Procedure: LEFT TOTAL HIP ARTHROPLASTY;  Surgeon: Thera Flake., MD;  Location: [facility] OR;  Service: Orthopedics;  Laterality: Left;   TRANSURETHRAL RESECTION OF BLADDER TUMOR N/A 08/23/2021   Procedure: TRANSURETHRAL RESECTION OF BLADDER TUMOR (TURBT) WITH CYSTOSCOPY/ POSTOPERATIVE INSTILLATION OF GEMCITABINE/retrograde pyelogram and stent placement (right);  Surgeon: Rene Paci, MD;  Location: [facility];  Service: Urology;  Laterality: N/A;   TRANSURETHRAL RESECTION OF BLADDER TUMOR N/A 01/10/2022   Procedure: TRANSURETHRAL RESECTION OF BLADDER TUMOR (TURBT) WITH CYSTOSCOPY / GEMCITABINE INSTILLATION post-operatively;  Surgeon: Rene Paci, MD;  Location: [facility];  Service: Urology;  Laterality: N/A;  ONLY NEEDS 30 MIN    REVIEW  OF SYSTEMS:  Constitutional: positive for fatigue Eyes: negative Ears, nose, mouth, throat, and face: negative Respiratory: negative Cardiovascular: negative Gastrointestinal: negative Genitourinary:negative Integument/breast: positive for skin color change and skin lesion(s) Hematologic/lymphatic: negative Musculoskeletal:negative Neurological: negative Behavioral/Psych: negative Endocrine: negative Allergic/Immunologic: negative   PHYSICAL EXAMINATION: General appearance: alert, cooperative, fatigued, and no distress Head: Normocephalic, without obvious abnormality, atraumatic Neck:  no adenopathy, no JVD, supple, symmetrical, trachea midline, and thyroid not enlarged, symmetric, no tenderness/mass/nodules Lymph nodes: Cervical, supraclavicular, and axillary nodes normal. Resp: clear to auscultation bilaterally Back: symmetric, no curvature. ROM normal. No CVA tenderness. Cardio: regular rate and rhythm, S1, S2 normal, no murmur, click, rub or gallop GI: soft, non-tender; bowel sounds normal; no masses,  no organomegaly Extremities: extremities normal, atraumatic, no cyanosis or edema Neurologic: Alert and oriented X 3, normal strength and tone. Normal symmetric reflexes. Normal coordination and gait  ECOG PERFORMANCE STATUS: 1 - Symptomatic but completely ambulatory  Blood pressure 139/75, pulse 73, temperature (!) 97.4 F (36.3 C), temperature source Temporal, resp. rate 18, height 5\' 8"  (1.727 m), weight 162 lb (73.5 kg), SpO2 97%.  LABORATORY DATA: Lab Results  Component Value Date   WBC 10.0 09/26/2023   HGB 10.7 (L) 09/26/2023   HCT 32.5 (L) 09/26/2023   MCV 102.2 (H) 09/26/2023   PLT 306 09/26/2023      Chemistry      Component Value Date/Time   NA 135 09/05/2023 0534   K 4.4 09/05/2023 0534   CL 108 09/05/2023 0534   CO2 22 09/05/2023 0534   BUN 28 (H) 09/05/2023 0534   CREATININE 1.09 (H) 09/05/2023 0534   CREATININE 1.53 (H) 08/29/2023 1424       Component Value Date/Time   CALCIUM 8.1 (L) 09/05/2023 0534   ALKPHOS 61 09/05/2023 0534   AST 21 09/05/2023 0534   AST 34 08/29/2023 1424   ALT 23 09/05/2023 0534   ALT 14 08/29/2023 1424   BILITOT 0.4 09/05/2023 0534   BILITOT 0.3 08/29/2023 1424       RADIOGRAPHIC STUDIES: No results found.   ASSESSMENT AND PLAN: This is a very pleasant 73 years old African-American female with metastatic urothelial carcinoma initially diagnosed in September 2022 as a stage II status post right nephro ureterectomy as well as intravesical treatment and resection of superficial tumor in the bladder for several times.  The patient was found to have evidence of metastatic disease in the lung in November 2023.  She underwent palliative systemic chemotherapy with carboplatin and gemcitabine status post 6 cycles with stable disease followed by maintenance treatment with immunotherapy with Avelumab 800 Mg IV every 2 weeks status post 6 cycles.  This treatment was discontinued secondary to disease progression. The patient had repeat CT scan of the chest, abdomen and pelvis performed recently.  I personally and independently reviewed the scan and discussed the result with the patient today.  Unfortunately her scan showed evidence for disease progression with enlargement of bilateral pulmonary nodules. Her molecular studies showed positive FGFR mutation and the patient will be a good candidate for treatment with erdafitinib starting at a dose of 8 mg p.o. daily.  The patient is tolerating her treatment much better now with no concerning complaints except for mild fatigue and paronychia. Her treatment with erdafitinib was discontinued secondary to disease progression and the patient started 1 cycle of treatment with enfortumab vedotin but this was discontinued secondary to significant skin rash and itching with likely TEN.    Metastatic urothelial carcinoma Morgan Torres has metastatic urothelial carcinoma, initially  diagnosed as stage II in September 2022, progressing to stage IV by March 2023. She has undergone multiple treatments including carboplatin and gemcitabine, pembrolizumab, erdafitinib, and enfortumab vedotin. 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 She reports significant improvement  in symptoms following steroid treatment, but the cancer persists. The current challenge is the exhaustion of effective treatment options. Given the limited options, a referral to [facility] for a second opinion is planned to explore potential alternatives. Hospice care is considered as a future option if no viable treatments are identified. - Discontinue enfortumab vedotin due to severe adverse reaction. - Refer to [facility] for a second opinion on further treatment options. - Order a scan to assess current status of the cancer. - Discuss potential hospice care if no viable treatment options are available.  Adverse reaction to enfortumab vedotin She experienced a severe skin reaction, likely TEN, following treatment with enfortumab vedotin. This reaction included significant skin peeling and required hospitalization in a burn unit. The reaction is rare but known to occur with this medication. - Discontinue enfortumab vedotin permanently. - Manage symptoms with steroids and supportive care.  Goals of Care She is currently feeling well and is not ready to transition to hospice care. However, due to the limited treatment options available, hospice care may be considered in the future if no effective treatments are identified. She expresses a strong preference to avoid further treatments that could result in severe adverse reactions like the one experienced with enfortumab vedotin. - Discuss potential transition to hospice care if no viable treatment options are identified.  Follow-up She requires  follow-up to assess the current status of her cancer and to explore further treatment options. - Order a scan to be done next week. - Schedule a follow-up appointment in approximately three weeks to review scan results and discuss further management.     She was advised to call immediately if she has any other concerning symptoms in the interval.  The patient voices understanding of current disease status and treatment options and is in agreement with the current care plan.  All questions were answered. The patient knows to call the clinic with any problems, questions or concerns. We can certainly see the patient much sooner if necessary.  The total time spent in the appointment was 30 minutes.  Disclaimer: This note was dictated with voice recognition software. Similar sounding words can inadvertently be transcribed and may not be corrected upon review.

## 2023-09-26 NOTE — Progress Notes (Signed)
 Referral and records faxed to [facility] dept

## 2023-09-26 NOTE — Patient Instructions (Signed)
 Enfortumab vedotin - you cannot take this drug . It gave you Steven's Johnson syndrome.

## 2023-09-27 LAB — T4: T4, Total: 6.4 ug/dL (ref 4.5–12.0)

## 2023-09-30 ENCOUNTER — Telehealth: Payer: Self-pay | Admitting: Medical Oncology

## 2023-09-30 DIAGNOSIS — L270 Generalized skin eruption due to drugs and medicaments taken internally: Secondary | ICD-10-CM | POA: Diagnosis not present

## 2023-09-30 DIAGNOSIS — J449 Chronic obstructive pulmonary disease, unspecified: Secondary | ICD-10-CM | POA: Diagnosis not present

## 2023-09-30 DIAGNOSIS — N1831 Chronic kidney disease, stage 3a: Secondary | ICD-10-CM | POA: Diagnosis not present

## 2023-09-30 DIAGNOSIS — E039 Hypothyroidism, unspecified: Secondary | ICD-10-CM | POA: Diagnosis not present

## 2023-09-30 DIAGNOSIS — I129 Hypertensive chronic kidney disease with stage 1 through stage 4 chronic kidney disease, or unspecified chronic kidney disease: Secondary | ICD-10-CM | POA: Diagnosis not present

## 2023-09-30 DIAGNOSIS — C679 Malignant neoplasm of bladder, unspecified: Secondary | ICD-10-CM | POA: Diagnosis not present

## 2023-09-30 DIAGNOSIS — D631 Anemia in chronic kidney disease: Secondary | ICD-10-CM | POA: Diagnosis not present

## 2023-09-30 DIAGNOSIS — R21 Rash and other nonspecific skin eruption: Secondary | ICD-10-CM | POA: Diagnosis not present

## 2023-09-30 DIAGNOSIS — C7802 Secondary malignant neoplasm of left lung: Secondary | ICD-10-CM | POA: Diagnosis not present

## 2023-09-30 DIAGNOSIS — C7911 Secondary malignant neoplasm of bladder: Secondary | ICD-10-CM | POA: Diagnosis not present

## 2023-09-30 NOTE — Telephone Encounter (Signed)
 Appt @ [facility] on wed .

## 2023-10-02 DIAGNOSIS — C679 Malignant neoplasm of bladder, unspecified: Secondary | ICD-10-CM | POA: Diagnosis not present

## 2023-10-02 DIAGNOSIS — C772 Secondary and unspecified malignant neoplasm of intra-abdominal lymph nodes: Secondary | ICD-10-CM | POA: Diagnosis not present

## 2023-10-02 DIAGNOSIS — C689 Malignant neoplasm of urinary organ, unspecified: Secondary | ICD-10-CM | POA: Diagnosis not present

## 2023-10-03 ENCOUNTER — Inpatient Hospital Stay

## 2023-10-03 ENCOUNTER — Ambulatory Visit (HOSPITAL_COMMUNITY)
Admission: RE | Admit: 2023-10-03 | Discharge: 2023-10-03 | Disposition: A | Discharge status: Home / Self Care | Source: Ambulatory Visit | Attending: Internal Medicine | Admitting: Internal Medicine

## 2023-10-03 DIAGNOSIS — C679 Malignant neoplasm of bladder, unspecified: Secondary | ICD-10-CM | POA: Insufficient documentation

## 2023-10-03 DIAGNOSIS — C7800 Secondary malignant neoplasm of unspecified lung: Secondary | ICD-10-CM | POA: Insufficient documentation

## 2023-10-03 DIAGNOSIS — J439 Emphysema, unspecified: Secondary | ICD-10-CM | POA: Diagnosis not present

## 2023-10-03 DIAGNOSIS — C771 Secondary and unspecified malignant neoplasm of intrathoracic lymph nodes: Secondary | ICD-10-CM | POA: Diagnosis not present

## 2023-10-05 DIAGNOSIS — H04123 Dry eye syndrome of bilateral lacrimal glands: Secondary | ICD-10-CM | POA: Diagnosis not present

## 2023-10-05 DIAGNOSIS — H16143 Punctate keratitis, bilateral: Secondary | ICD-10-CM | POA: Diagnosis not present
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
## 2023-10-10 ENCOUNTER — Ambulatory Visit: Payer: Medicare Other | Admitting: Internal Medicine

## 2023-10-10 ENCOUNTER — Other Ambulatory Visit: Payer: Medicare Other

## 2023-10-10 ENCOUNTER — Telehealth: Payer: Self-pay

## 2023-10-10 ENCOUNTER — Ambulatory Visit: Admitting: Physical Therapy

## 2023-10-10 ENCOUNTER — Encounter: Payer: Self-pay | Admitting: Physical Therapy

## 2023-10-10 ENCOUNTER — Ambulatory Visit: Payer: Medicare Other

## 2023-10-10 VITALS — BP 113/62 | HR 71

## 2023-10-10 DIAGNOSIS — R2681 Unsteadiness on feet: Secondary | ICD-10-CM | POA: Insufficient documentation

## 2023-10-10 DIAGNOSIS — R2689 Other abnormalities of gait and mobility: Secondary | ICD-10-CM | POA: Diagnosis not present

## 2023-10-10 DIAGNOSIS — M6281 Muscle weakness (generalized): Secondary | ICD-10-CM | POA: Insufficient documentation

## 2023-10-10 NOTE — Telephone Encounter (Signed)
 Spoke with patient in regards to a cushion for her wheel chair.  Contacted Amalia Badder, CSW per her note on 09/19/2023.  Amalia Badder contacted patient and LVM letting patient know that the cushion for the wheelchair is at [employer] in [place].  Their number is [phone]. Amalia Badder informed me of this information incase patient calls our office back.

## 2023-10-14 DIAGNOSIS — C679 Malignant neoplasm of bladder, unspecified: Secondary | ICD-10-CM | POA: Diagnosis not present

## 2023-10-14 DIAGNOSIS — C7800 Secondary malignant neoplasm of unspecified lung: Secondary | ICD-10-CM | POA: Diagnosis not present

## 2023-10-14 DIAGNOSIS — C7910 Secondary malignant neoplasm of unspecified urinary organs: Secondary | ICD-10-CM | POA: Diagnosis not present

## 2023-10-14 DIAGNOSIS — D630 Anemia in neoplastic disease: Secondary | ICD-10-CM | POA: Diagnosis not present

## 2023-10-14 DIAGNOSIS — J440 Chronic obstructive pulmonary disease with acute lower respiratory infection: Secondary | ICD-10-CM | POA: Diagnosis not present

## 2023-10-14 DIAGNOSIS — E222 Syndrome of inappropriate secretion of antidiuretic hormone: Secondary | ICD-10-CM | POA: Diagnosis not present

## 2023-10-14 DIAGNOSIS — I739 Peripheral vascular disease, unspecified: Secondary | ICD-10-CM | POA: Diagnosis not present

## 2023-10-15 NOTE — Progress Notes (Unsigned)
 [facility] OFFICE PROGRESS NOTE  Wilburn Handler, MD [address] [place] [address]  DIAGNOSIS: Metastatic urothelial carcinoma that was initially diagnosed as stage II (T2, N0, M0) in September 2022 status post right nephroureterectomy as well as intravesical treatment and resection of superficial tumor in the bladder several times and March 2023 as well as July 2023.  The patient was found to have enlarging and new pulmonary nodules consistent with metastatic disease in November 2023.   PRIOR THERAPY: 1) Right nephroureterectomy as well as intravesical treatment and resection of superficial tumor in the bladder several times and March 2023 as well as July 2023.  2) Palliative systemic chemotherapy with carboplatin  for AUC of 5 on day 1 and gemcitabine  1000 mg/M2 on days 1 and 8 every 3 weeks. First cycle June 21, 2022. Status post 6 cycles. Her dose of carboplatin  was reduced to AUC of 4 and gemcitabine  to 800 mg/m2 starting from cycle #5 due to cytopenias. 3) Maintenance immunotherapy with Avelumab  800 mg IV every 2 weeks. First dose on 11/15/22. Status post 6 cycle of treatment. 4) Erdafitinib  (balversa ) 8 mg p.o daily. First dose 02/22/23. Status post 4 months of treatment. Discontinued in February 2025 due to disease progression.  5) Enfortumab vedotin  infusion therapy: once a week for three weeks, followed by one week off, repeated every four weeks. First dose was on 08/08/2023.  Status post 1 cycle.  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 female returns to the clinic today for a follow up visit. She was last seen in the clinic on 09/26/23. 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 Dr. [name] recommended a restaging CT scan. He also saw  Dr. Zada Herrlich at [facility] on 10/02/23. They discussed how she now has received all FDA approved treatments for UC. They dicussed that if her restaging scan shows a response to treatment, that they would recommend a treatment break. If progressive/active disease, they recommended Enhertu if HER2 positive for which she is (not HER2 positive according to foundation one testing but we can recheck).  If negative, then she would recommend sacituzumab govitecan Chaya Cord). Regarding clinical trials, while [facility] does have an open study for FGFR3 altered UC, However, her GFR needs to be >45 and hers is 30s-41 in recent past so would not qualify for this.   Otherwise she denies any major changes in her health. She continues to take her sodium tablets twice a day due to history of hyponatremia. Her energy is improved.  She denies any fever, chills, night sweats, or unexplained weight loss. Her appetite has improved and she has gained weight. Her breathing is "good" but she sometimes "gets a little winded but it's not bad".  Regarding cough, she states she coughs "a little but not a lot".  She denies any chest pain or hemoptysis.  She denies any nausea, vomiting, diarrhea, or constipation.  She denies any abdominal pain or back pain.  She states she is actually not having any pain at all.  She is currently doing PT and OT and states it is going well.  She recently had a restaging CT scan.  She is here today for evaluation to review the scan results and for a more detailed discussion about her current condition and treatment options.  MEDICAL HISTORY: Past Medical History:  Diagnosis Date   Anxiety    Arthritis    hip, lumbar spine    Asthma    seasonal allergies    Bronchitis    Hx: of   Chronic kidney disease    COPD (chronic obstructive pulmonary disease) (HCC)    Coronary artery disease    Depression    Fibromyalgia    Hypertension    Lumbar herniated disc    Metastatic urothelial carcinoma (HCC)     Myocardial infarction (HCC) 06/25/2012   followed by Dr. Maximo Spar, treated medically, no stents   Peripheral arterial disease (HCC)    of right foot   Pneumonia    Hx: of yrs ago   Sciatica    Sleep apnea    Small bowel obstruction (HCC)    several yrs ago    ALLERGIES:  is allergic to diclofenac, padcev  [enfortumab vedotin ], ciprofloxacin, codeine, and hydrocodone.  MEDICATIONS:  Current Outpatient Medications  Medication Sig Dispense Refill   acetaminophen  (TYLENOL ) 500 MG tablet Take 1 tablet (500 mg total) by mouth every 6 (six) hours as needed for mild pain. 30 tablet 0   albuterol  (PROVENTIL  HFA;VENTOLIN  HFA) 108 (90 Base) MCG/ACT inhaler Inhale 2 puffs into the lungs every 6 (six) hours as needed for wheezing or shortness of breath. 1 Inhaler 0   aspirin  EC 81 MG tablet Take 81 mg by mouth daily. Swallow whole.     cilostazol  (PLETAL ) 50 MG tablet Take 50 mg by mouth 2 (two) times daily.     clotrimazole  (LOTRIMIN ) 1 % external solution Apply topically 2 (two) times daily. 30 mL 0   EPINEPHrine  (EPIPEN  2-PAK) 0.3 mg/0.3 mL DEVI Inject 0.3 mLs (0.3 mg total) into the muscle once as needed (for severe allergic reaction). CAll 911 immediately if you have to use this medicine (Patient taking differently: Inject 0.3 mg into the muscle once as needed (for a severe allergic reaction- CALL 9-1-1 IMMEDIATELY IF USED).) 1 Device 1   guaiFENesin  (MUCINEX ) 600 MG 12 hr tablet Take 2 tablets (1,200 mg total) by mouth 2 (two) times daily. (Patient taking differently: Take 1,200 mg by mouth 2 (two) times daily as needed for to loosen phlegm.) 120 tablet 0   HYDROmorphone  (DILAUDID ) 1 MG/ML injection Inject 1 mL (1 mg total) into the vein every 2 (two) hours as needed for severe pain (pain score 7-10).     levothyroxine  (SYNTHROID ) 88 MCG tablet Take 1 tablet (88 mcg total) by mouth daily before breakfast. 90 tablet 2   lidocaine -prilocaine  (EMLA ) cream Apply 1 Application topically as needed. (Patient  taking differently: Apply 1 Application topically as needed (for port access).) 30 g 2   LORazepam  (ATIVAN ) 0.5 MG tablet Take 0.5 mg by mouth daily as needed for anxiety.     methylPREDNISolone  sodium succinate (SOLU-MEDROL ) 125 mg/2 mL injection Inject 1.28 mLs (80 mg total) into the vein daily.     MILK OF MAGNESIA 400 MG/5ML suspension Take 15-30 mLs by mouth daily as needed for mild constipation.     nicotine  (NICODERM CQ  - DOSED IN MG/24 HOURS) 21 mg/24hr patch Place 21 mg onto the skin daily.     nicotine  polacrilex (NICORETTE ) 4 MG gum Take 4 mg by mouth as needed for smoking cessation.     nitroGLYCERIN  (NITROSTAT ) 0.4 MG SL tablet Place 1 tablet (0.4 mg total) under the tongue every 5 (five) minutes x 3 doses as needed for chest pain. 25 tablet 2  oxybutynin  (DITROPAN ) 5 MG tablet Take 1 tablet (5 mg total) by mouth every 8 (eight) hours as needed for bladder spasms. (Patient not taking: Reported on 09/02/2023) 30 tablet 1   polyethylene glycol (MIRALAX  / GLYCOLAX ) 17 g packet Take 17 g by mouth as needed for mild constipation (mix as directed).     sodium chloride  1 g tablet Take 2 tablets (2 g total) by mouth 2 (two) times daily with a meal. (Patient taking differently: Take 1 g by mouth See admin instructions. Split 1 gram into two halves and take by mouth once a day) 60 tablet 0   temazepam  (RESTORIL ) 15 MG capsule Take 1 capsule (15 mg total) by mouth at bedtime as needed for sleep. 30 capsule 0   traMADol  (ULTRAM ) 50 MG tablet Take 50 mg by mouth every 6 (six) hours as needed (for pain).     No current facility-administered medications for this visit.    SURGICAL HISTORY:  Past Surgical History:  Procedure Laterality Date   ABDOMINAL SURGERY     resection of small intestine   ANTERIOR CRUCIATE LIGAMENT REPAIR Bilateral    BACK SURGERY     fusion of lumbar   CARDIAC CATHETERIZATION  06/25/2012   COLON SURGERY     resection for bowel - for obstruction  6 to 7 yrs ago per pt on  01-05-2022   COLONOSCOPY     Hx; of   DILATION AND CURETTAGE OF UTERUS     EYE SURGERY Bilateral    cataracts removed   HERNIA REPAIR  02/23/1969   inguinal hernia    IR IMAGING GUIDED PORT INSERTION  06/07/2022   LEFT HEART CATHETERIZATION WITH CORONARY ANGIOGRAM N/A 03/30/2013   Procedure: LEFT HEART CATHETERIZATION WITH CORONARY ANGIOGRAM;  Surgeon: Arleen Lacer, MD;  Location: [facility] CATH LAB;  Service: Cardiovascular;  Laterality: N/A;   left thumb surgery     yrs ago   ROBOT ASSITED LAPAROSCOPIC NEPHROURETERECTOMY Left 03/09/2021   Procedure: XI ROBOT ASSITED LAPAROSCOPIC NEPHROURETERECTOMY/ CYSTOSCOPY WITH LEFT URETEROSCOPY WITH TRANSURETHRAL RESECTION OF URETERAL ORIFICE;  Surgeon: Adelbert Homans, MD;  Location: [facility] ORS;  Service: Urology;  Laterality: Left;   SALIVARY GLAND SURGERY Left    approached from inside mouth & side of neck 1970-1980   TONSILLECTOMY     as child   TOTAL HIP ARTHROPLASTY Left 05/28/2014   Procedure: LEFT TOTAL HIP ARTHROPLASTY;  Surgeon: Forbes Ida., MD;  Location: [facility] OR;  Service: Orthopedics;  Laterality: Left;   TRANSURETHRAL RESECTION OF BLADDER TUMOR N/A 08/23/2021   Procedure: TRANSURETHRAL RESECTION OF BLADDER TUMOR (TURBT) WITH CYSTOSCOPY/ POSTOPERATIVE INSTILLATION OF GEMCITABINE /retrograde pyelogram and stent placement (right);  Surgeon: Adelbert Homans, MD;  Location: [facility];  Service: Urology;  Laterality: N/A;   TRANSURETHRAL RESECTION OF BLADDER TUMOR N/A 01/10/2022   Procedure: TRANSURETHRAL RESECTION OF BLADDER TUMOR (TURBT) WITH CYSTOSCOPY / GEMCITABINE  INSTILLATION post-operatively;  Surgeon: Adelbert Homans, MD;  Location: [facility];  Service: Urology;  Laterality: N/A;  ONLY NEEDS 30 MIN    REVIEW OF SYSTEMS:   Review of Systems  Constitutional: Positive for improved fatigue. Negative for appetite change, chills, fever and unexpected weight change.  HENT: Negative  for mouth sores, nosebleeds, sore throat and trouble swallowing.   Eyes: Negative for eye problems and icterus.  Respiratory: Positive for mild intermittent cough and dyspnea on exertion. Negative for hemoptysis and wheezing.   Cardiovascular: Negative for chest pain and leg swelling.  Gastrointestinal: Negative for abdominal pain, constipation, diarrhea, nausea and vomiting.  Genitourinary: Negative for bladder incontinence, difficulty urinating, dysuria, frequency and hematuria.   Musculoskeletal: Negative for back pain, gait problem, neck pain and neck stiffness.  Skin: Improved SJS/TENs. Negative for itching and rash.  Neurological: Negative for dizziness, extremity weakness, gait problem, headaches, light-headedness and seizures.  Hematological: Negative for adenopathy. Does not bruise/bleed easily.  Psychiatric/Behavioral: Negative for confusion, depression and sleep disturbance. The patient is not nervous/anxious.     PHYSICAL EXAMINATION:  Blood pressure (!) 145/53, pulse 64, temperature 97.8 F (36.6 C), temperature source Temporal, resp. rate 13, weight 169 lb 11.2 oz (77 kg), SpO2 96%.  ECOG PERFORMANCE STATUS: 1  Physical Exam  Constitutional: Oriented to person, place, and time and well-developed, well-nourished, and in no distress.   HENT:  Head: Normocephalic and atraumatic.  Mouth/Throat: Oropharynx is clear and moist. No oropharyngeal exudate.  Eyes: Conjunctivae are normal. Right eye exhibits no discharge. Left eye exhibits no discharge. No scleral icterus.  Neck: Normal range of motion. Neck supple.  Cardiovascular: Normal rate, regular rhythm, normal heart sounds and intact distal pulses.   Pulmonary/Chest: Effort normal and breath sounds normal. No respiratory distress. No wheezes. No rales.  Abdominal: Soft. Bowel sounds are normal. Exhibits no distension and no mass. There is no tenderness.  Musculoskeletal: Normal range of motion. Exhibits no edema.   Lymphadenopathy:    No cervical adenopathy.  Neurological: Alert and oriented to person, place, and time. Exhibits normal muscle tone. Gait normal. Coordination normal. Walks with a walker.  Skin: Skin is warm and dry.. Not diaphoretic. No erythema. No pallor.  Psychiatric: Mood, memory and judgment normal.  Vitals reviewed.  LABORATORY DATA: Lab Results  Component Value Date   WBC 10.0 09/26/2023   HGB 10.7 (L) 09/26/2023   HCT 32.5 (L) 09/26/2023   MCV 102.2 (H) 09/26/2023   PLT 306 09/26/2023      Chemistry      Component Value Date/Time   NA 133 (L) 09/26/2023 0905   K 4.4 09/26/2023 0905   CL 101 09/26/2023 0905   CO2 22 09/26/2023 0905   BUN 19 09/26/2023 0905   CREATININE 1.37 (H) 09/26/2023 0905      Component Value Date/Time   CALCIUM  8.5 (L) 09/26/2023 0905   ALKPHOS 64 09/26/2023 0905   AST 17 09/26/2023 0905   ALT 21 09/26/2023 0905   BILITOT 0.3 09/26/2023 0905       RADIOGRAPHIC STUDIES:  CT Chest Wo Contrast Result Date: 10/16/2023 CLINICAL DATA:  Metastatic bladder cancer. Mediastinal and hilar nodal metastasis and pulmonary metastasis on recent PET-CT. Assess treatment response. * Tracking Code: BO * EXAM: CT CHEST WITHOUT CONTRAST TECHNIQUE: Multidetector CT imaging of the chest was performed following the standard protocol without IV contrast. RADIATION DOSE REDUCTION: This exam was performed according to the departmental dose-optimization program which includes automated exposure control, adjustment of the mA and/or kV according to patient size and/or use of iterative reconstruction technique. COMPARISON:  Chest CT 07/09/2023 and PET-CT 07/29/2023 FINDINGS: Cardiovascular: The heart is normal in size. No pericardial effusion. Stable severe/advanced aortic and coronary artery calcifications. No aneurysm. Mediastinum/Nodes: Extensive and progressive mediastinal lymphadenopathy. Upper pretracheal lymph node on image 44/2 measures 10 mm and previously  measured 6 mm. Right paratracheal node on image 54/2 measures 22 mm and previously measured 16 mm. Prevascular node on image 57/2 measures 16 mm and previously measured 10 mm. AP window node measures 20 mm and previously measured  12 mm. Subcarinal node measures 16 mm on image 78/2 and previously measured 13 mm. The esophagus is grossly normal. Lungs/Pleura: Stable underlying emphysematous changes and pulmonary scarring. 19 mm superior segment left lower lobe pulmonary lesion on image 57/7 previously measured 15 mm. Is a new finding. Upper Abdomen: No significant upper abdominal findings. Stable advanced vascular disease. No hepatic or adrenal gland lesions. Musculoskeletal: No significant bony findings. IMPRESSION: 1. Extensive and progressive mediastinal lymphadenopathy. 2. Enlarging and new pulmonary nodules. 3. Stable underlying emphysematous changes and pulmonary scarring. 4. Stable advanced vascular disease. Aortic Atherosclerosis (ICD10-I70.0) and Emphysema (ICD10-J43.9). Electronically Signed   By: Marrian Siva M.D.   On: 10/16/2023 15:17     ASSESSMENT/PLAN:  his is a very pleasant 73 year old African-American female with metastatic urothelial carcinoma.  She was initially diagnosed as a stage II (T2, N0, M0) in September 2022.    She is status post right nephroureterectomy as well as  intravesical treatment and resection of superficial tumor in the bladder several times and March 2023 as well as July 2023.  The patient was found to have enlarging and new pulmonary nodules consistent with metastatic disease in November 2023.   Dr. [name] sent her tissue for foundation 1 and PD-L1 expression. She does not have any actionable mutations   She completed 6 cycles of systemic chemotherapy with carboplatin  for an AUC of 5 on day 1 and gemcitabine  1000 milligrams per meter squared on days 1 and 8 IV every 3 weeks with Neulasta  support.  Her dose was reduced to carboplatin  for an AUC of 4 and gemcitabine   800 mg/m due to cytopenias transfusion support.   Dr. [name] recommended maintenance immunotherapy with Avelumab  2 mg IV every 2 weeks.  She is status post 6 cycles of treatment and she tolerated it well without any concerning adverse side.    She had repeat imaging in August 2024 showing interval enlargement of bilateral pulmonary nodules consistent with progressive metastatic disease.    She started on EGFR therapy with Balversa  8 milligrams p.o. daily. She started this on 02/22/23. She had been on this for 4 months.She recently had disease progression and this was discontinued.    Therefore, Dr. [name] started her on enfortumab vedotin  once a week for three weeks, followed by one week off, repeated every four weeks.  She started this on 08/08/2023. She is status post 1 treatment. 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   The patient was last seen by Dr. [name] on/3/25.  She also saw Dr. Zada Herrlich at [facility] on/9/25.  They discussed the options.  They discussed that if her restaging scan shows a response to treatment, that they would recommend a treatment break. If progressive/active disease, they recommended Enhertu if HER2 positive for which she is not HER2 positive according to foundation one testing but we will call pathology to run again.  If negative, then she would recommend sacituzumab govitecan Chaya Cord). Regarding clinical trials, while [facility] does have an open study for FGFR3 altered UC but GFR needs to be >45 and hers is 30s-41 in recent past so would not qualify for this.   The patient was seen with Dr. [name] today.  Dr. [name] personally and independently reviewed the restaging CT scan.  The scan showed enlargec  mediastinal lymph nodes and pulmonary nodules.  We have reach out to pathology to retest for Her-2.   Will see her in 2 weeks to review the results.  We will revisit the options outlined by Dr. Zada Herrlich.  She will continue salt tablets.   # Severe skin reaction to Padcev   - SJS/TEN Severe skin reaction post-Padcev  requiring burn unit care. Healing well but residual discomfort persists. Padcev  contraindicated. - Avoid further use of Padcev . - Monitor healing of skin reaction.  # Goals of Care Focused on quality of life while exploring treatment options. Not ready to forego treatment. Discussed balancing treatment risks with quality of life and potential for supportive care if options exhausted. - Discuss treatment options with a focus on quality of life. - Consider supportive care if cancer-directed options are exhausted.   The patient was advised to call immediately if she has any concerning symptoms in the interval. The patient voices understanding of current disease status and treatment options and is in agreement with the current care plan. All questions were answered. The patient knows to call the clinic with any problems, questions or concerns. We can certainly see the patient much sooner if necessary      Orders Placed This Encounter  Procedures   CBC with Differential (Cancer Center Only)    Standing Status:   Future    Expected Date:   10/31/2023    Expiration Date:   10/16/2024   CMP (Cancer Center only)    Standing Status:   Future    Expected Date:   10/31/2023    Expiration Date:   10/16/2024     I spent {CHL ONC TIME VISIT - RUEAV:[id number]} counseling the patient face to face. The total time spent in the appointment was {CHL ONC TIME VISIT - WGNFA:[id number]}.  Gagandeep Pettet L Namine Beahm, PA-C 10/17/23

## 2023-10-16 ENCOUNTER — Ambulatory Visit: Admitting: Physical Therapy

## 2023-10-17 ENCOUNTER — Encounter: Admitting: Dietician

## 2023-10-17 ENCOUNTER — Other Ambulatory Visit: Payer: Medicare Other

## 2023-10-17 ENCOUNTER — Ambulatory Visit: Payer: Medicare Other | Admitting: Internal Medicine

## 2023-10-17 ENCOUNTER — Inpatient Hospital Stay (HOSPITAL_BASED_OUTPATIENT_CLINIC_OR_DEPARTMENT_OTHER): Admitting: Physician Assistant

## 2023-10-17 ENCOUNTER — Ambulatory Visit: Payer: Medicare Other

## 2023-10-17 VITALS — BP 145/53 | HR 64 | Temp 97.8°F | Resp 13 | Wt 169.7 lb

## 2023-10-17 DIAGNOSIS — E1122 Type 2 diabetes mellitus with diabetic chronic kidney disease: Secondary | ICD-10-CM | POA: Diagnosis not present

## 2023-10-17 DIAGNOSIS — J439 Emphysema, unspecified: Secondary | ICD-10-CM | POA: Diagnosis not present

## 2023-10-17 DIAGNOSIS — C771 Secondary and unspecified malignant neoplasm of intrathoracic lymph nodes: Secondary | ICD-10-CM | POA: Diagnosis not present

## 2023-10-17 DIAGNOSIS — Z7189 Other specified counseling: Secondary | ICD-10-CM | POA: Diagnosis not present

## 2023-10-17 DIAGNOSIS — Z881 Allergy status to other antibiotic agents status: Secondary | ICD-10-CM | POA: Diagnosis not present

## 2023-10-17 DIAGNOSIS — N1831 Chronic kidney disease, stage 3a: Secondary | ICD-10-CM | POA: Diagnosis not present

## 2023-10-17 DIAGNOSIS — L513 Stevens-Johnson syndrome-toxic epidermal necrolysis overlap syndrome: Secondary | ICD-10-CM | POA: Diagnosis not present

## 2023-10-17 DIAGNOSIS — R590 Localized enlarged lymph nodes: Secondary | ICD-10-CM | POA: Diagnosis not present

## 2023-10-17 DIAGNOSIS — C679 Malignant neoplasm of bladder, unspecified: Secondary | ICD-10-CM

## 2023-10-17 DIAGNOSIS — C7800 Secondary malignant neoplasm of unspecified lung: Secondary | ICD-10-CM

## 2023-10-17 DIAGNOSIS — Z9089 Acquired absence of other organs: Secondary | ICD-10-CM | POA: Diagnosis not present

## 2023-10-17 DIAGNOSIS — R21 Rash and other nonspecific skin eruption: Secondary | ICD-10-CM | POA: Diagnosis not present

## 2023-10-17 DIAGNOSIS — G4730 Sleep apnea, unspecified: Secondary | ICD-10-CM | POA: Diagnosis not present

## 2023-10-17 DIAGNOSIS — Z885 Allergy status to narcotic agent status: Secondary | ICD-10-CM | POA: Diagnosis not present

## 2023-10-17 DIAGNOSIS — I2510 Atherosclerotic heart disease of native coronary artery without angina pectoris: Secondary | ICD-10-CM | POA: Diagnosis not present

## 2023-10-17 DIAGNOSIS — Z9889 Other specified postprocedural states: Secondary | ICD-10-CM | POA: Diagnosis not present

## 2023-10-17 DIAGNOSIS — Z9226 Personal history of immune checkpoint inhibitor therapy: Secondary | ICD-10-CM | POA: Diagnosis not present

## 2023-10-17 DIAGNOSIS — E871 Hypo-osmolality and hyponatremia: Secondary | ICD-10-CM | POA: Diagnosis not present

## 2023-10-17 DIAGNOSIS — Z7902 Long term (current) use of antithrombotics/antiplatelets: Secondary | ICD-10-CM | POA: Diagnosis not present

## 2023-10-17 DIAGNOSIS — Z79899 Other long term (current) drug therapy: Secondary | ICD-10-CM | POA: Diagnosis not present

## 2023-10-17 DIAGNOSIS — I252 Old myocardial infarction: Secondary | ICD-10-CM | POA: Diagnosis not present

## 2023-10-17 DIAGNOSIS — I700 Atherosclerosis of aorta: Secondary | ICD-10-CM | POA: Diagnosis not present

## 2023-10-18 ENCOUNTER — Encounter: Payer: Self-pay | Admitting: Physical Therapy

## 2023-10-18 ENCOUNTER — Telehealth: Payer: Self-pay

## 2023-10-18 ENCOUNTER — Other Ambulatory Visit: Payer: Self-pay | Admitting: Medical Oncology

## 2023-10-18 ENCOUNTER — Ambulatory Visit: Admitting: Physical Therapy

## 2023-10-18 ENCOUNTER — Encounter: Payer: Self-pay | Admitting: Internal Medicine

## 2023-10-18 VITALS — BP 148/63 | HR 67

## 2023-10-18 DIAGNOSIS — R2689 Other abnormalities of gait and mobility: Secondary | ICD-10-CM | POA: Diagnosis not present

## 2023-10-18 DIAGNOSIS — M6281 Muscle weakness (generalized): Secondary | ICD-10-CM

## 2023-10-18 DIAGNOSIS — R2681 Unsteadiness on feet: Secondary | ICD-10-CM | POA: Diagnosis not present
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
## 2023-10-18 NOTE — Telephone Encounter (Signed)
 Late entry 10/17/2023 - Called [facility] pathology to add on HER 2 testing to pathology from 09/30/2023 and LVM for return call.   10/18/2023 Ethelle Herb from [facility] LVM stating to fax request for HER 2 testing to [phone].  Faxed over last note from provider with confirmation.

## 2023-10-21 ENCOUNTER — Ambulatory Visit: Admitting: Physical Therapy

## 2023-10-21 ENCOUNTER — Encounter: Payer: Self-pay | Admitting: Physical Therapy

## 2023-10-21 VITALS — BP 146/84 | HR 83

## 2023-10-21 DIAGNOSIS — R2681 Unsteadiness on feet: Secondary | ICD-10-CM | POA: Diagnosis not present

## 2023-10-21 DIAGNOSIS — M6281 Muscle weakness (generalized): Secondary | ICD-10-CM

## 2023-10-21 DIAGNOSIS — R2689 Other abnormalities of gait and mobility: Secondary | ICD-10-CM
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
## 2023-10-22 ENCOUNTER — Encounter: Payer: Self-pay | Admitting: Internal Medicine

## 2023-10-22 NOTE — Telephone Encounter (Signed)
 Received call from [facility] at [facility] stating that the records I faxed over cannot be found, so re-faxed HER 2 testing request to [phone] with confirmation.

## 2023-10-23 ENCOUNTER — Encounter: Payer: Self-pay | Admitting: Physical Therapy

## 2023-10-23 ENCOUNTER — Ambulatory Visit: Admitting: Physical Therapy

## 2023-10-23 VITALS — BP 138/76 | HR 66

## 2023-10-23 DIAGNOSIS — R2681 Unsteadiness on feet: Secondary | ICD-10-CM

## 2023-10-23 DIAGNOSIS — R2689 Other abnormalities of gait and mobility: Secondary | ICD-10-CM | POA: Diagnosis not present

## 2023-10-23 DIAGNOSIS — M6281 Muscle weakness (generalized): Secondary | ICD-10-CM | POA: Diagnosis not present

## 2023-10-23 DIAGNOSIS — R5381 Other malaise: Secondary | ICD-10-CM | POA: Diagnosis not present

## 2023-10-23 LAB — SURGICAL PATHOLOGY
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
## 2023-10-24 ENCOUNTER — Other Ambulatory Visit: Payer: Medicare Other

## 2023-10-24 ENCOUNTER — Ambulatory Visit: Payer: Medicare Other

## 2023-10-24 ENCOUNTER — Ambulatory Visit: Payer: Medicare Other | Admitting: Physician Assistant

## 2023-10-27 NOTE — Progress Notes (Unsigned)
 [facility] OFFICE PROGRESS NOTE  Wilburn Handler, MD [address] [place] [address]  DIAGNOSIS: Metastatic urothelial carcinoma that was initially diagnosed as stage II (T2, N0, M0) in September 2022 status post right nephroureterectomy as well as intravesical treatment and resection of superficial tumor in the bladder several times and March 2023 as well as July 2023.  The patient was found to have enlarging and new pulmonary nodules consistent with metastatic disease in November 2023.   PRIOR THERAPY: 1) Right nephroureterectomy as well as intravesical treatment and resection of superficial tumor in the bladder several times and March 2023 as well as July 2023.  2) Palliative systemic chemotherapy with carboplatin  for AUC of 5 on day 1 and gemcitabine  1000 mg/M2 on days 1 and 8 every 3 weeks. First cycle June 21, 2022. Status post 6 cycles. Her dose of carboplatin  was reduced to AUC of 4 and gemcitabine  to 800 mg/m2 starting from cycle #5 due to cytopenias. 3) Maintenance immunotherapy with Avelumab  800 mg IV every 2 weeks. First dose on 11/15/22. Status post 6 cycle of treatment. 4) Erdafitinib  (balversa ) 8 mg p.o daily. First dose 02/22/23. Status post 4 months of treatment. Discontinued in February 2025 due to disease progression.  5) Enfortumab vedotin  infusion therapy: once a week for three weeks, followed by one week off, repeated every four weeks. First dose was on 08/08/2023.  Status post 1 cycle.  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 First dose 11/05/2023 INTERVAL HISTORY: JOHNNETTE AZZARELLI 73 y.o. female returns  to the clinic today for a follow up visit. She was last seen in the clinic on 10/17/23 by Dr. [name] and myself. In summary, At that time, her  treatment with Enfortumab was permanently discontinued due to SJS/TEN. Dr. [name] recommended a restaging CT scan. He also saw Dr. Zada Herrlich at [facility] on 10/02/23. They discussed how she now has received all FDA approved treatments for UC. They dicussed that if her restaging scan shows a response to treatment, that they would recommend a treatment break. If progressive/active disease, they recommended Enhertu if HER2 positive for which she is (not HER2 positive according to foundation one testing but we can recheck).  If negative, then she would recommend sacituzumab govitecan Chaya Cord). Regarding clinical trials, while [facility] does have an open study for FGFR3 altered UC, However, her GFR needs to be >45 and hers is 30s-41 in recent past so would not qualify for this. She had a CT scan that was reviewed at her last appointment that showed progressive disease.   She had repeat Her2 testing which was negative (Her2 1+).   Otherwise she denies any major changes in her health. She continues to take her sodium tablets twice a day due to history of hyponatremia. Her energy is improved.  She denies any fever, chills, night sweats, or unexplained weight loss. Her appetite has improved compared to prior. Her breathing is "its ok" but she sometimes "gets a little winded but not a lot".  Regarding cough, she states she only coughs "a little".  She denies any chest pain or hemoptysis.  She denies any nausea, vomiting, diarrhea, or constipation.  She denies any abdominal pain. She has chronic back pain in the low back which she previously was seen by orthopedics.  She  is currently doing PT.  She is here today for evaluation for a more detailed discussion about her current condition and treatment options.      MEDICAL HISTORY: Past Medical History:  Diagnosis Date   Anxiety    Arthritis    hip, lumbar spine    Asthma    seasonal allergies    Bronchitis    Hx: of   Chronic kidney disease    COPD (chronic obstructive  pulmonary disease) (HCC)    Coronary artery disease    Depression    Fibromyalgia    Hypertension    Lumbar herniated disc    Metastatic urothelial carcinoma (HCC)    Myocardial infarction (HCC) 06/25/2012   followed by Dr. Maximo Spar, treated medically, no stents   Peripheral arterial disease (HCC)    of right foot   Pneumonia    Hx: of yrs ago   Sciatica    Sleep apnea    Small bowel obstruction (HCC)    several yrs ago    ALLERGIES:  is allergic to diclofenac, padcev  [enfortumab vedotin ], ciprofloxacin, codeine, and hydrocodone.  MEDICATIONS:  Current Outpatient Medications  Medication Sig Dispense Refill   acetaminophen  (TYLENOL ) 500 MG tablet Take 1 tablet (500 mg total) by mouth every 6 (six) hours as needed for mild pain. 30 tablet 0   albuterol  (PROVENTIL  HFA;VENTOLIN  HFA) 108 (90 Base) MCG/ACT inhaler Inhale 2 puffs into the lungs every 6 (six) hours as needed for wheezing or shortness of breath. 1 Inhaler 0   aspirin  EC 81 MG tablet Take 81 mg by mouth daily. Swallow whole.     cilostazol  (PLETAL ) 50 MG tablet Take 50 mg by mouth 2 (two) times daily.     clotrimazole  (LOTRIMIN ) 1 % external solution Apply topically 2 (two) times daily. 30 mL 0   EPINEPHrine  (EPIPEN  2-PAK) 0.3 mg/0.3 mL DEVI Inject 0.3 mLs (0.3 mg total) into the muscle once as needed (for severe allergic reaction). CAll 911 immediately if you have to use this medicine (Patient taking differently: Inject 0.3 mg into the muscle once as needed (for a severe allergic reaction- CALL 9-1-1 IMMEDIATELY IF USED).) 1 Device 1   guaiFENesin  (MUCINEX ) 600 MG 12 hr tablet Take 2 tablets (1,200 mg total) by mouth 2 (two) times daily. (Patient taking differently: Take 1,200 mg by mouth 2 (two) times daily as needed for to loosen phlegm.) 120 tablet 0   HYDROmorphone  (DILAUDID ) 1 MG/ML injection Inject 1 mL (1 mg total) into the vein every 2 (two) hours as needed for severe pain (pain score 7-10).     levothyroxine  (SYNTHROID )  88 MCG tablet Take 1 tablet (88 mcg total) by mouth daily before breakfast. 90 tablet 2   lidocaine -prilocaine  (EMLA ) cream Apply 1 Application topically as needed. (Patient taking differently: Apply 1 Application topically as needed (for port access).) 30 g 2   LORazepam  (ATIVAN ) 0.5 MG tablet Take 0.5 mg by mouth daily as needed for anxiety.     methylPREDNISolone  sodium succinate (SOLU-MEDROL ) 125 mg/2 mL injection Inject 1.28 mLs (80 mg total) into the vein daily.     MILK OF MAGNESIA 400 MG/5ML suspension Take 15-30 mLs by mouth daily as needed for mild constipation.     nicotine  (NICODERM CQ  - DOSED IN MG/24 HOURS) 21 mg/24hr patch Place 21 mg onto the skin daily.     nicotine  polacrilex (NICORETTE ) 4 MG gum Take 4 mg by mouth as needed for smoking cessation.     nitroGLYCERIN  (NITROSTAT ) 0.4  MG SL tablet Place 1 tablet (0.4 mg total) under the tongue every 5 (five) minutes x 3 doses as needed for chest pain. 25 tablet 2   oxybutynin  (DITROPAN ) 5 MG tablet Take 1 tablet (5 mg total) by mouth every 8 (eight) hours as needed for bladder spasms. (Patient not taking: Reported on 09/02/2023) 30 tablet 1   polyethylene glycol (MIRALAX  / GLYCOLAX ) 17 g packet Take 17 g by mouth as needed for mild constipation (mix as directed).     sodium chloride  1 g tablet Take 2 tablets (2 g total) by mouth 2 (two) times daily with a meal. (Patient taking differently: Take 1 g by mouth See admin instructions. Split 1 gram into two halves and take by mouth once a day) 60 tablet 0   temazepam  (RESTORIL ) 15 MG capsule Take 1 capsule (15 mg total) by mouth at bedtime as needed for sleep. 30 capsule 0   traMADol  (ULTRAM ) 50 MG tablet Take 50 mg by mouth every 6 (six) hours as needed (for pain).     No current facility-administered medications for this visit.    SURGICAL HISTORY:  Past Surgical History:  Procedure Laterality Date   ABDOMINAL SURGERY     resection of small intestine   ANTERIOR CRUCIATE LIGAMENT  REPAIR Bilateral    BACK SURGERY     fusion of lumbar   CARDIAC CATHETERIZATION  06/25/2012   COLON SURGERY     resection for bowel - for obstruction  6 to 7 yrs ago per pt on 01-05-2022   COLONOSCOPY     Hx; of   DILATION AND CURETTAGE OF UTERUS     EYE SURGERY Bilateral    cataracts removed   HERNIA REPAIR  02/23/1969   inguinal hernia    IR IMAGING GUIDED PORT INSERTION  06/07/2022   LEFT HEART CATHETERIZATION WITH CORONARY ANGIOGRAM N/A 03/30/2013   Procedure: LEFT HEART CATHETERIZATION WITH CORONARY ANGIOGRAM;  Surgeon: Arleen Lacer, MD;  Location: [facility] CATH LAB;  Service: Cardiovascular;  Laterality: N/A;   left thumb surgery     yrs ago   ROBOT ASSITED LAPAROSCOPIC NEPHROURETERECTOMY Left 03/09/2021   Procedure: XI ROBOT ASSITED LAPAROSCOPIC NEPHROURETERECTOMY/ CYSTOSCOPY WITH LEFT URETEROSCOPY WITH TRANSURETHRAL RESECTION OF URETERAL ORIFICE;  Surgeon: Adelbert Homans, MD;  Location: [facility] ORS;  Service: Urology;  Laterality: Left;   SALIVARY GLAND SURGERY Left    approached from inside mouth & side of neck 1970-1980   TONSILLECTOMY     as child   TOTAL HIP ARTHROPLASTY Left 05/28/2014   Procedure: LEFT TOTAL HIP ARTHROPLASTY;  Surgeon: Forbes Ida., MD;  Location: [facility] OR;  Service: Orthopedics;  Laterality: Left;   TRANSURETHRAL RESECTION OF BLADDER TUMOR N/A 08/23/2021   Procedure: TRANSURETHRAL RESECTION OF BLADDER TUMOR (TURBT) WITH CYSTOSCOPY/ POSTOPERATIVE INSTILLATION OF GEMCITABINE /retrograde pyelogram and stent placement (right);  Surgeon: Adelbert Homans, MD;  Location: [facility];  Service: Urology;  Laterality: N/A;   TRANSURETHRAL RESECTION OF BLADDER TUMOR N/A 01/10/2022   Procedure: TRANSURETHRAL RESECTION OF BLADDER TUMOR (TURBT) WITH CYSTOSCOPY / GEMCITABINE  INSTILLATION post-operatively;  Surgeon: Adelbert Homans, MD;  Location: [facility];  Service: Urology;  Laterality: N/A;  ONLY NEEDS 30 MIN     REVIEW OF SYSTEMS:   Review of Systems  Constitutional: Positive for improved fatigue. Negative for appetite change, chills, fever and unexpected weight change.  HENT: Negative for mouth sores, nosebleeds, sore throat and trouble swallowing.   Eyes: Negative for eye problems  and icterus.  Respiratory: Positive for mild intermittent cough and dyspnea on exertion. Negative for hemoptysis and wheezing.   Cardiovascular: Negative for chest pain and leg swelling.  Gastrointestinal: Negative for abdominal pain, constipation, diarrhea, nausea and vomiting.  Genitourinary: Negative for bladder incontinence, difficulty urinating, dysuria, frequency and hematuria.   Musculoskeletal: Positive for chronic back pain. Negative for gait problem, neck pain and neck stiffness.  Skin: Improved SJS/TENs. Negative for itching and rash.  Neurological: Negative for dizziness, extremity weakness, gait problem, headaches, light-headedness and seizures.  Hematological: Negative for adenopathy. Does not bruise/bleed easily.  Psychiatric/Behavioral: Negative for confusion, depression and sleep disturbance. The patient is not nervous/anxious.      PHYSICAL EXAMINATION:  There were no vitals taken for this visit.  ECOG PERFORMANCE STATUS: 1  Physical Exam  Constitutional: Oriented to person, place, and time and well-developed, well-nourished, and in no distress.   HENT:  Head: Normocephalic and atraumatic.  Mouth/Throat: Oropharynx is clear and moist. No oropharyngeal exudate.  Eyes: Conjunctivae are normal. Right eye exhibits no discharge. Left eye exhibits no discharge. No scleral icterus.  Neck: Normal range of motion. Neck supple.  Cardiovascular: Normal rate, regular rhythm, normal heart sounds and intact distal pulses.   Pulmonary/Chest: Effort normal and breath sounds normal. No respiratory distress. No wheezes. No rales.  Abdominal: Soft. Bowel sounds are normal. Exhibits no distension and no mass.  There is no tenderness.  Musculoskeletal: Normal range of motion. Exhibits no edema.  Lymphadenopathy:    No cervical adenopathy.  Neurological: Alert and oriented to person, place, and time. Exhibits normal muscle tone. Gait normal. Coordination normal. Walks with a walker.  Skin: Skin is warm and dry.. Not diaphoretic. No erythema. No pallor.  Psychiatric: Mood, memory and judgment normal.  Vitals reviewed.  LABORATORY DATA: Lab Results  Component Value Date   WBC 10.0 09/26/2023   HGB 10.7 (L) 09/26/2023   HCT 32.5 (L) 09/26/2023   MCV 102.2 (H) 09/26/2023   PLT 306 09/26/2023      Chemistry      Component Value Date/Time   NA 133 (L) 09/26/2023 0905   K 4.4 09/26/2023 0905   CL 101 09/26/2023 0905   CO2 22 09/26/2023 0905   BUN 19 09/26/2023 0905   CREATININE 1.37 (H) 09/26/2023 0905      Component Value Date/Time   CALCIUM  8.5 (L) 09/26/2023 0905   ALKPHOS 64 09/26/2023 0905   AST 17 09/26/2023 0905   ALT 21 09/26/2023 0905   BILITOT 0.3 09/26/2023 0905       RADIOGRAPHIC STUDIES:  CT Chest Wo Contrast Result Date: 10/16/2023 CLINICAL DATA:  Metastatic bladder cancer. Mediastinal and hilar nodal metastasis and pulmonary metastasis on recent PET-CT. Assess treatment response. * Tracking Code: BO * EXAM: CT CHEST WITHOUT CONTRAST TECHNIQUE: Multidetector CT imaging of the chest was performed following the standard protocol without IV contrast. RADIATION DOSE REDUCTION: This exam was performed according to the departmental dose-optimization program which includes automated exposure control, adjustment of the mA and/or kV according to patient size and/or use of iterative reconstruction technique. COMPARISON:  Chest CT 07/09/2023 and PET-CT 07/29/2023 FINDINGS: Cardiovascular: The heart is normal in size. No pericardial effusion. Stable severe/advanced aortic and coronary artery calcifications. No aneurysm. Mediastinum/Nodes: Extensive and progressive mediastinal  lymphadenopathy. Upper pretracheal lymph node on image 44/2 measures 10 mm and previously measured 6 mm. Right paratracheal node on image 54/2 measures 22 mm and previously measured 16 mm. Prevascular node on image 57/2 measures 16  mm and previously measured 10 mm. AP window node measures 20 mm and previously measured 12 mm. Subcarinal node measures 16 mm on image 78/2 and previously measured 13 mm. The esophagus is grossly normal. Lungs/Pleura: Stable underlying emphysematous changes and pulmonary scarring. 19 mm superior segment left lower lobe pulmonary lesion on image 57/7 previously measured 15 mm. Is a new finding. Upper Abdomen: No significant upper abdominal findings. Stable advanced vascular disease. No hepatic or adrenal gland lesions. Musculoskeletal: No significant bony findings. IMPRESSION: 1. Extensive and progressive mediastinal lymphadenopathy. 2. Enlarging and new pulmonary nodules. 3. Stable underlying emphysematous changes and pulmonary scarring. 4. Stable advanced vascular disease. Aortic Atherosclerosis (ICD10-I70.0) and Emphysema (ICD10-J43.9). Electronically Signed   By: Marrian Siva M.D.   On: 10/16/2023 15:17     ASSESSMENT/PLAN:  his is a very pleasant 73 year old African-American female with metastatic urothelial carcinoma.  She was initially diagnosed as a stage II (T2, N0, M0) in September 2022.    She is status post right nephroureterectomy as well as  intravesical treatment and resection of superficial tumor in the bladder several times and March 2023 as well as July 2023.  The patient was found to have enlarging and new pulmonary nodules consistent with metastatic disease in November 2023.   Dr. [name] sent her tissue for foundation 1 and PD-L1 expression. She does not have any actionable mutations   She completed 6 cycles of systemic chemotherapy with carboplatin  for an AUC of 5 on day 1 and gemcitabine  1000 milligrams per meter squared on days 1 and 8 IV every 3 weeks  with Neulasta  support.  Her dose was reduced to carboplatin  for an AUC of 4 and gemcitabine  800 mg/m due to cytopenias transfusion support.   Dr. [name] recommended maintenance immunotherapy with Avelumab  2 mg IV every 2 weeks.  She is status post 6 cycles of treatment and she tolerated it well without any concerning adverse side.    She had repeat imaging in August 2024 showing interval enlargement of bilateral pulmonary nodules consistent with progressive metastatic disease.    She started on EGFR therapy with Balversa  8 milligrams p.o. daily. She started this on 02/22/23. She had been on this for 4 months.She recently had disease progression and this was discontinued.    Therefore, Dr. [name] started her on enfortumab vedotin  once a week for three weeks, followed by one week off, repeated every four weeks.  She started this on 08/08/2023. She is status post 1 treatment. 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    She also saw Dr. Zada Herrlich at [facility] on/9/25.  They discussed the options.  They discussed that if her restaging scan shows a response to treatment, that they would recommend a treatment break. If progressive/active disease, they recommended Enhertu if HER2 positive for which she is not HER2 positive according to foundation one testing but we will call pathology to run again.  If negative, then she would recommend sacituzumab govitecan Chaya Cord). Regarding clinical trials, while [facility] does have an open study for FGFR3 altered UC but GFR needs to be >45 and hers is 30s-41 in recent past so she would not qualify for this.   Repeat Her2 testing showed negative for Her2  Her recent showed disease progression.   The patient was seen with Dr. [name]. Dr. [name] will place care plan for sacituzumab govitecan Chaya Cord)  We will see her in 2  weeks for evaluation and repeat blood work before undergoing day 8 cycle #1  Hypertension Hypertension is managed  with medication. Reminded to take her  blood pressure medication upon returning home. - Ensure she takes blood pressure medication as prescribed.   She was sent prescriptions for Zofran  for nausea and vomiting if needed.  She was also given a Dron to take 80 mg daily for 3 days following chemotherapy.  She was also given Imodium.  I also gave her handout of the medication to review adverse side effects.  # Severe skin reaction to Padcev  - SJS/TEN Severe skin reaction post-Padcev  requiring burn unit care. Healing well but residual discomfort persists. Padcev  contraindicated. - Avoid further use of Padcev . - Monitor healing of skin reaction.  # Goals of Care Focused on quality of life while exploring treatment options. Not ready to forego treatment. Discussed balancing treatment risks with quality of life and potential for supportive care if options exhausted. - Discuss treatment options with a focus on quality of life. - Consider supportive care if cancer-directed options are exhausted.  The patient was advised to call immediately if she has any concerning symptoms in the interval. The patient voices understanding of current disease status and treatment options and is in agreement with the current care plan. All questions were answered. The patient knows to call the clinic with any problems, questions or concerns. We can certainly see the patient much sooner if necessary.    No orders of the defined types were placed in this encounter.    Teague Goynes L Elwyn Lowden, PA-C 10/27/23  ADDENDUM: Hematology/Oncology Attending: I had a face-to-face encounter with the patient today.  I reviewed her records, lab and recommended her care plan.  This is a very pleasant 73 years old African-American female with metastatic urothelial carcinoma that was initially diagnosed as stage II in September 2022 but has evidence for disease progression with pulmonary metastasis in November 2023.  She underwent several treatment in the past  including systemic chemotherapy with carboplatin  and gemcitabine  followed by maintenance treatment with Avelumab  discontinued secondary to disease progression.  Then the patient underwent treatment with erdafitinib  for 4 months discontinued secondary to disease progression.  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  She was seen for a second opinion by Dr. Zada Herrlich at [facility] and she recommended retesting for HER2 expression which unfortunately came back negative.  It was recommended for the patient to consider treatment with Sacituzumab Govitecan.  The patient is interested in this treatment and she is expected to start the first dose of her treatment next week but will be given at a dose of 10 Mg/KG on days 1 and 8 every 3 weeks.  I discussed with the patient the adverse effect of this treatment including the risk of the skin rash again that could be severe.  We also discussed the allergic reaction. The patient is interested in the treatment and we will provide her with handout about Sacituzumab. She will come back for follow-up visit in 2 weeks for evaluation and management of any adverse effect of her treatment. She was advised to call immediately if she has any other concerning symptoms in the interval. The total time spent in the appointment was 30 minutes. Disclaimer: This note was dictated with voice recognition software. Similar sounding words can inadvertently be transcribed and may be missed upon review. Aurelio Blower, MD

## 2023-10-29 ENCOUNTER — Inpatient Hospital Stay: Attending: Internal Medicine

## 2023-10-29 ENCOUNTER — Inpatient Hospital Stay: Admitting: Physician Assistant

## 2023-10-29 ENCOUNTER — Other Ambulatory Visit: Payer: Self-pay | Admitting: Physician Assistant

## 2023-10-29 VITALS — BP 160/58 | HR 90 | Temp 97.9°F | Resp 16 | Wt 170.2 lb

## 2023-10-29 DIAGNOSIS — I129 Hypertensive chronic kidney disease with stage 1 through stage 4 chronic kidney disease, or unspecified chronic kidney disease: Secondary | ICD-10-CM | POA: Diagnosis not present

## 2023-10-29 DIAGNOSIS — C771 Secondary and unspecified malignant neoplasm of intrathoracic lymph nodes: Secondary | ICD-10-CM | POA: Insufficient documentation

## 2023-10-29 DIAGNOSIS — C7910 Secondary malignant neoplasm of unspecified urinary organs: Secondary | ICD-10-CM | POA: Diagnosis not present

## 2023-10-29 DIAGNOSIS — Z833 Family history of diabetes mellitus: Secondary | ICD-10-CM | POA: Insufficient documentation

## 2023-10-29 DIAGNOSIS — C7800 Secondary malignant neoplasm of unspecified lung: Secondary | ICD-10-CM

## 2023-10-29 DIAGNOSIS — I252 Old myocardial infarction: Secondary | ICD-10-CM | POA: Insufficient documentation

## 2023-10-29 DIAGNOSIS — L513 Stevens-Johnson syndrome-toxic epidermal necrolysis overlap syndrome: Secondary | ICD-10-CM | POA: Insufficient documentation

## 2023-10-29 DIAGNOSIS — Z5112 Encounter for antineoplastic immunotherapy: Secondary | ICD-10-CM | POA: Insufficient documentation

## 2023-10-29 DIAGNOSIS — R5383 Other fatigue: Secondary | ICD-10-CM | POA: Insufficient documentation

## 2023-10-29 DIAGNOSIS — Z881 Allergy status to other antibiotic agents status: Secondary | ICD-10-CM | POA: Diagnosis not present

## 2023-10-29 DIAGNOSIS — C679 Malignant neoplasm of bladder, unspecified: Secondary | ICD-10-CM

## 2023-10-29 DIAGNOSIS — Z9089 Acquired absence of other organs: Secondary | ICD-10-CM | POA: Diagnosis not present

## 2023-10-29 DIAGNOSIS — Z803 Family history of malignant neoplasm of breast: Secondary | ICD-10-CM | POA: Insufficient documentation

## 2023-10-29 DIAGNOSIS — Z7982 Long term (current) use of aspirin: Secondary | ICD-10-CM | POA: Insufficient documentation

## 2023-10-29 DIAGNOSIS — Z7902 Long term (current) use of antithrombotics/antiplatelets: Secondary | ICD-10-CM | POA: Insufficient documentation

## 2023-10-29 DIAGNOSIS — N1831 Chronic kidney disease, stage 3a: Secondary | ICD-10-CM | POA: Diagnosis not present

## 2023-10-29 DIAGNOSIS — G8929 Other chronic pain: Secondary | ICD-10-CM | POA: Insufficient documentation

## 2023-10-29 DIAGNOSIS — Z8249 Family history of ischemic heart disease and other diseases of the circulatory system: Secondary | ICD-10-CM | POA: Insufficient documentation

## 2023-10-29 DIAGNOSIS — W19XXXA Unspecified fall, initial encounter: Secondary | ICD-10-CM | POA: Insufficient documentation

## 2023-10-29 DIAGNOSIS — M549 Dorsalgia, unspecified: Secondary | ICD-10-CM | POA: Diagnosis not present

## 2023-10-29 DIAGNOSIS — Z885 Allergy status to narcotic agent status: Secondary | ICD-10-CM | POA: Diagnosis not present

## 2023-10-29 DIAGNOSIS — Z79890 Hormone replacement therapy (postmenopausal): Secondary | ICD-10-CM | POA: Diagnosis not present

## 2023-10-29 DIAGNOSIS — T465X6A Underdosing of other antihypertensive drugs, initial encounter: Secondary | ICD-10-CM | POA: Insufficient documentation

## 2023-10-29 DIAGNOSIS — I700 Atherosclerosis of aorta: Secondary | ICD-10-CM | POA: Insufficient documentation

## 2023-10-29 DIAGNOSIS — F17210 Nicotine dependence, cigarettes, uncomplicated: Secondary | ICD-10-CM | POA: Diagnosis not present

## 2023-10-29 DIAGNOSIS — Z79899 Other long term (current) drug therapy: Secondary | ICD-10-CM | POA: Insufficient documentation

## 2023-10-29 DIAGNOSIS — J439 Emphysema, unspecified: Secondary | ICD-10-CM | POA: Insufficient documentation

## 2023-10-29 DIAGNOSIS — E038 Other specified hypothyroidism: Secondary | ICD-10-CM | POA: Insufficient documentation

## 2023-10-29 DIAGNOSIS — I2510 Atherosclerotic heart disease of native coronary artery without angina pectoris: Secondary | ICD-10-CM | POA: Insufficient documentation

## 2023-10-29 DIAGNOSIS — Z9226 Personal history of immune checkpoint inhibitor therapy: Secondary | ICD-10-CM | POA: Insufficient documentation

## 2023-10-29 DIAGNOSIS — Z809 Family history of malignant neoplasm, unspecified: Secondary | ICD-10-CM | POA: Insufficient documentation

## 2023-10-29 LAB — CBC WITH DIFFERENTIAL (CANCER CENTER ONLY)
Abs Immature Granulocytes: 0.06 10*3/uL (ref 0.00–0.07)
Basophils Absolute: 0.1 10*3/uL (ref 0.0–0.1)
Basophils Relative: 1 %
Eosinophils Absolute: 0.1 10*3/uL (ref 0.0–0.5)
Eosinophils Relative: 1 %
HCT: 38.5 % (ref 36.0–46.0)
Hemoglobin: 12.5 g/dL (ref 12.0–15.0)
Immature Granulocytes: 1 %
Lymphocytes Relative: 40 %
Lymphs Abs: 2.4 10*3/uL (ref 0.7–4.0)
MCH: 32.3 pg (ref 26.0–34.0)
MCHC: 32.5 g/dL (ref 30.0–36.0)
MCV: 99.5 fL (ref 80.0–100.0)
Monocytes Absolute: 0.6 10*3/uL (ref 0.1–1.0)
Monocytes Relative: 10 %
Neutro Abs: 2.9 10*3/uL (ref 1.7–7.7)
Neutrophils Relative %: 47 %
Platelet Count: 234 10*3/uL (ref 150–400)
RBC: 3.87 MIL/uL (ref 3.87–5.11)
RDW: 15.2 % (ref 11.5–15.5)
WBC Count: 6.1 10*3/uL (ref 4.0–10.5)
nRBC: 0 % (ref 0.0–0.2)

## 2023-10-29 LAB — CMP (CANCER CENTER ONLY)
ALT: 13 U/L (ref 0–44)
AST: 16 U/L (ref 15–41)
Albumin: 3.8 g/dL (ref 3.5–5.0)
Alkaline Phosphatase: 70 U/L (ref 38–126)
Anion gap: 4 — ABNORMAL LOW (ref 5–15)
BUN: 14 mg/dL (ref 8–23)
CO2: 28 mmol/L (ref 22–32)
Calcium: 8.9 mg/dL (ref 8.9–10.3)
Chloride: 106 mmol/L (ref 98–111)
Creatinine: 1.41 mg/dL — ABNORMAL HIGH (ref 0.44–1.00)
GFR, Estimated: 39 mL/min — ABNORMAL LOW (ref >60)
Glucose, Bld: 95 mg/dL (ref 70–99)
Potassium: 4.4 mmol/L (ref 3.5–5.1)
Sodium: 138 mmol/L (ref 135–145)
Total Bilirubin: 0.3 mg/dL (ref 0.0–1.2)
Total Protein: 6.4 g/dL — ABNORMAL LOW (ref 6.5–8.1)

## 2023-10-29 MED ORDER — LOPERAMIDE HCL 2 MG PO CAPS: ORAL_CAPSULE | ORAL | 3 refills | Status: DC

## 2023-10-29 MED ORDER — DEXAMETHASONE 4 MG PO TABS: ORAL_TABLET | ORAL | 1 refills | Status: DC

## 2023-10-29 MED ORDER — ONDANSETRON HCL 8 MG PO TABS: 8.0000 mg | ORAL_TABLET | Freq: Three times a day (TID) | ORAL | 1 refills | Status: DC | PRN

## 2023-10-29 NOTE — Progress Notes (Signed)
 DISCONTINUE ON PATHWAY REGIMEN - Bladder     A cycle is every 28 days:     Enfortumab vedotin -ejfv   **Always confirm dose/schedule in your pharmacy ordering system**  PRIOR TREATMENT: [id number]: Enfortumab vedotin  1.25 mg/kg D1, 8, 15 q28 Days Until Progression or Unacceptable Toxicity  START OFF PATHWAY REGIMEN - Bladder   [id number]:Sacituzumab govitecan 10 mg/kg IV D1,8 q21 Days:   A cycle is every 21 days:     Sacituzumab govitecan-hziy   **Always confirm dose/schedule in your pharmacy ordering system**  Patient Characteristics: Advanced/Metastatic Disease, Third Line and Beyond, HER2 Negative/Unknown Therapeutic Status: Advanced/Metastatic Disease Line of Therapy: Third Line and Beyond HER2 Expression Status by IHC: Negative (IHC 0, 1+) Intent of Therapy: Non-Curative / Palliative Intent, Discussed with Patient

## 2023-10-30 ENCOUNTER — Other Ambulatory Visit: Payer: Self-pay | Admitting: Medical Oncology

## 2023-10-30 ENCOUNTER — Ambulatory Visit: Admitting: Physical Therapy

## 2023-10-30 ENCOUNTER — Other Ambulatory Visit: Payer: Self-pay

## 2023-10-30 ENCOUNTER — Telehealth: Payer: Self-pay | Admitting: Internal Medicine

## 2023-10-30 DIAGNOSIS — C7800 Secondary malignant neoplasm of unspecified lung: Secondary | ICD-10-CM

## 2023-10-30 MED ORDER — LIDOCAINE-PRILOCAINE 2.5-2.5 % EX CREA
1.0000 | TOPICAL_CREAM | CUTANEOUS | 2 refills | Status: DC | PRN
Start: 2023-10-30 — End: 2024-03-31

## 2023-10-30 NOTE — Telephone Encounter (Signed)
 Called the patient to inform her of scheduled appointments. The patient is aware and was transferred to a nurse for clinical clarification on numbing cream.

## 2023-11-04 ENCOUNTER — Encounter: Payer: Self-pay | Admitting: Physical Therapy

## 2023-11-04 ENCOUNTER — Ambulatory Visit: Admitting: Physical Therapy

## 2023-11-04 VITALS — BP 142/81 | HR 84

## 2023-11-04 DIAGNOSIS — M6281 Muscle weakness (generalized): Secondary | ICD-10-CM | POA: Insufficient documentation

## 2023-11-04 DIAGNOSIS — R2689 Other abnormalities of gait and mobility: Secondary | ICD-10-CM | POA: Insufficient documentation

## 2023-11-04 DIAGNOSIS — R2681 Unsteadiness on feet: Secondary | ICD-10-CM | POA: Diagnosis not present

## 2023-11-04 MED FILL — Fosaprepitant Dimeglumine For IV Infusion 150 MG (Base Eq): INTRAVENOUS | Qty: 5 | Status: AC
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
## 2023-11-05 ENCOUNTER — Inpatient Hospital Stay

## 2023-11-05 ENCOUNTER — Inpatient Hospital Stay (HOSPITAL_BASED_OUTPATIENT_CLINIC_OR_DEPARTMENT_OTHER): Admitting: Physician Assistant

## 2023-11-05 ENCOUNTER — Telehealth: Payer: Self-pay | Admitting: Medical Oncology

## 2023-11-05 ENCOUNTER — Other Ambulatory Visit: Payer: Self-pay | Admitting: Medical Oncology

## 2023-11-05 ENCOUNTER — Other Ambulatory Visit: Payer: Self-pay | Admitting: Physician Assistant

## 2023-11-05 VITALS — BP 124/55 | HR 70 | Temp 98.0°F | Resp 18 | Wt 171.0 lb

## 2023-11-05 VITALS — BP 124/55 | HR 70 | Temp 98.0°F | Resp 18

## 2023-11-05 DIAGNOSIS — M549 Dorsalgia, unspecified: Secondary | ICD-10-CM | POA: Diagnosis not present

## 2023-11-05 DIAGNOSIS — I959 Hypotension, unspecified: Secondary | ICD-10-CM

## 2023-11-05 DIAGNOSIS — Z9089 Acquired absence of other organs: Secondary | ICD-10-CM | POA: Diagnosis not present

## 2023-11-05 DIAGNOSIS — I252 Old myocardial infarction: Secondary | ICD-10-CM | POA: Diagnosis not present

## 2023-11-05 DIAGNOSIS — T465X6A Underdosing of other antihypertensive drugs, initial encounter: Secondary | ICD-10-CM | POA: Diagnosis not present

## 2023-11-05 DIAGNOSIS — I700 Atherosclerosis of aorta: Secondary | ICD-10-CM | POA: Diagnosis not present

## 2023-11-05 DIAGNOSIS — C7910 Secondary malignant neoplasm of unspecified urinary organs: Secondary | ICD-10-CM

## 2023-11-05 DIAGNOSIS — N1831 Chronic kidney disease, stage 3a: Secondary | ICD-10-CM | POA: Diagnosis not present

## 2023-11-05 DIAGNOSIS — I129 Hypertensive chronic kidney disease with stage 1 through stage 4 chronic kidney disease, or unspecified chronic kidney disease: Secondary | ICD-10-CM | POA: Diagnosis not present

## 2023-11-05 DIAGNOSIS — R5383 Other fatigue: Secondary | ICD-10-CM | POA: Diagnosis not present

## 2023-11-05 DIAGNOSIS — C7800 Secondary malignant neoplasm of unspecified lung: Secondary | ICD-10-CM

## 2023-11-05 DIAGNOSIS — G8929 Other chronic pain: Secondary | ICD-10-CM | POA: Diagnosis not present

## 2023-11-05 DIAGNOSIS — Z5112 Encounter for antineoplastic immunotherapy: Secondary | ICD-10-CM | POA: Diagnosis not present

## 2023-11-05 DIAGNOSIS — Z7902 Long term (current) use of antithrombotics/antiplatelets: Secondary | ICD-10-CM | POA: Diagnosis not present

## 2023-11-05 DIAGNOSIS — Z881 Allergy status to other antibiotic agents status: Secondary | ICD-10-CM | POA: Diagnosis not present

## 2023-11-05 DIAGNOSIS — Z79899 Other long term (current) drug therapy: Secondary | ICD-10-CM | POA: Diagnosis not present

## 2023-11-05 DIAGNOSIS — F17210 Nicotine dependence, cigarettes, uncomplicated: Secondary | ICD-10-CM | POA: Diagnosis not present

## 2023-11-05 DIAGNOSIS — C679 Malignant neoplasm of bladder, unspecified: Secondary | ICD-10-CM | POA: Diagnosis not present

## 2023-11-05 DIAGNOSIS — L513 Stevens-Johnson syndrome-toxic epidermal necrolysis overlap syndrome: Secondary | ICD-10-CM | POA: Diagnosis not present

## 2023-11-05 DIAGNOSIS — J439 Emphysema, unspecified: Secondary | ICD-10-CM | POA: Diagnosis not present

## 2023-11-05 DIAGNOSIS — I2510 Atherosclerotic heart disease of native coronary artery without angina pectoris: Secondary | ICD-10-CM | POA: Diagnosis not present

## 2023-11-05 DIAGNOSIS — Z7982 Long term (current) use of aspirin: Secondary | ICD-10-CM | POA: Diagnosis not present

## 2023-11-05 DIAGNOSIS — C771 Secondary and unspecified malignant neoplasm of intrathoracic lymph nodes: Secondary | ICD-10-CM | POA: Diagnosis not present

## 2023-11-05 DIAGNOSIS — E038 Other specified hypothyroidism: Secondary | ICD-10-CM | POA: Diagnosis not present

## 2023-11-05 DIAGNOSIS — Z885 Allergy status to narcotic agent status: Secondary | ICD-10-CM | POA: Diagnosis not present

## 2023-11-05 LAB — CMP (CANCER CENTER ONLY)
ALT: 10 U/L (ref 0–44)
AST: 15 U/L (ref 15–41)
Albumin: 3.7 g/dL (ref 3.5–5.0)
Alkaline Phosphatase: 69 U/L (ref 38–126)
Anion gap: 6 (ref 5–15)
BUN: 24 mg/dL — ABNORMAL HIGH (ref 8–23)
CO2: 28 mmol/L (ref 22–32)
Calcium: 8.8 mg/dL — ABNORMAL LOW (ref 8.9–10.3)
Chloride: 102 mmol/L (ref 98–111)
Creatinine: 1.66 mg/dL — ABNORMAL HIGH (ref 0.44–1.00)
GFR, Estimated: 32 mL/min — ABNORMAL LOW (ref >60)
Glucose, Bld: 98 mg/dL (ref 70–99)
Potassium: 4.2 mmol/L (ref 3.5–5.1)
Sodium: 136 mmol/L (ref 135–145)
Total Bilirubin: 0.3 mg/dL (ref 0.0–1.2)
Total Protein: 6.2 g/dL — ABNORMAL LOW (ref 6.5–8.1)

## 2023-11-05 LAB — CBC WITH DIFFERENTIAL (CANCER CENTER ONLY)
Abs Immature Granulocytes: 0.02 10*3/uL (ref 0.00–0.07)
Basophils Absolute: 0 10*3/uL (ref 0.0–0.1)
Basophils Relative: 1 %
Eosinophils Absolute: 0.1 10*3/uL (ref 0.0–0.5)
Eosinophils Relative: 1 %
HCT: 33.8 % — ABNORMAL LOW (ref 36.0–46.0)
Hemoglobin: 11.2 g/dL — ABNORMAL LOW (ref 12.0–15.0)
Immature Granulocytes: 0 %
Lymphocytes Relative: 45 %
Lymphs Abs: 2.5 10*3/uL (ref 0.7–4.0)
MCH: 32.8 pg (ref 26.0–34.0)
MCHC: 33.1 g/dL (ref 30.0–36.0)
MCV: 99.1 fL (ref 80.0–100.0)
Monocytes Absolute: 0.6 10*3/uL (ref 0.1–1.0)
Monocytes Relative: 11 %
Neutro Abs: 2.3 10*3/uL (ref 1.7–7.7)
Neutrophils Relative %: 42 %
Platelet Count: 268 10*3/uL (ref 150–400)
RBC: 3.41 MIL/uL — ABNORMAL LOW (ref 3.87–5.11)
RDW: 14.6 % (ref 11.5–15.5)
WBC Count: 5.5 10*3/uL (ref 4.0–10.5)
nRBC: 0 % (ref 0.0–0.2)

## 2023-11-05 LAB — MAGNESIUM: Magnesium: 1.5 mg/dL — ABNORMAL LOW (ref 1.7–2.4)

## 2023-11-05 LAB — PHOSPHORUS: Phosphorus: 4 mg/dL (ref 2.5–4.6)

## 2023-11-05 MED ORDER — FOSAPREPITANT DIMEGLUMINE INJECTION 150 MG
150.0000 mg | Freq: Once | INTRAVENOUS | Status: DC
  Filled 2023-11-05: qty 5

## 2023-11-05 MED ORDER — SODIUM CHLORIDE 0.9 % IV SOLN: INTRAVENOUS | Status: AC

## 2023-11-05 MED ORDER — SODIUM CHLORIDE 0.9 % IV SOLN
INTRAVENOUS | Status: AC
Start: 2023-11-05 — End: 2023-11-05

## 2023-11-05 MED ORDER — HEPARIN SOD (PORK) LOCK FLUSH 100 UNIT/ML IV SOLN
500.0000 [IU] | Freq: Once | INTRAVENOUS | Status: AC | PRN
  Administered 2023-11-05: 500 [IU]

## 2023-11-05 MED ORDER — SODIUM CHLORIDE 1 G PO TABS: 1.0000 g | ORAL_TABLET | Freq: Every day | ORAL | 0 refills | Status: DC

## 2023-11-05 MED ORDER — SODIUM CHLORIDE 0.9% FLUSH
10.0000 mL | INTRAVENOUS | Status: DC | PRN
  Administered 2023-11-05: 10 mL

## 2023-11-05 MED ORDER — PALONOSETRON HCL INJECTION 0.25 MG/5ML
0.2500 mg | Freq: Once | INTRAVENOUS | Status: DC
  Filled 2023-11-05: qty 5

## 2023-11-05 MED ORDER — SODIUM CHLORIDE 0.9 % IV SOLN: INTRAVENOUS | Status: DC

## 2023-11-05 MED ORDER — SODIUM CHLORIDE 0.9 % IV SOLN: 10.0000 mg/kg | Freq: Once | INTRAVENOUS | Status: DC

## 2023-11-05 MED ORDER — ATROPINE SULFATE 1 MG/ML IV SOLN
0.5000 mg | Freq: Once | INTRAVENOUS | Status: DC | PRN
Start: 2023-11-05 — End: 2023-11-05

## 2023-11-05 MED ORDER — ACETAMINOPHEN 325 MG PO TABS
650.0000 mg | ORAL_TABLET | Freq: Once | ORAL | Status: DC
  Filled 2023-11-05: qty 2

## 2023-11-05 MED ORDER — DEXAMETHASONE SODIUM PHOSPHATE 10 MG/ML IJ SOLN
10.0000 mg | Freq: Once | INTRAMUSCULAR | Status: DC
  Filled 2023-11-05: qty 1

## 2023-11-05 MED ORDER — DIPHENHYDRAMINE HCL 50 MG/ML IJ SOLN
50.0000 mg | Freq: Once | INTRAMUSCULAR | Status: DC
Start: 2023-11-05 — End: 2023-11-05
  Filled 2023-11-05: qty 1

## 2023-11-05 MED ORDER — FAMOTIDINE IN NACL 20-0.9 MG/50ML-% IV SOLN
20.0000 mg | Freq: Once | INTRAVENOUS | Status: DC
  Filled 2023-11-05: qty 50

## 2023-11-05 MED ORDER — MAGNESIUM OXIDE -MG SUPPLEMENT 400 (240 MG) MG PO TABS: 400.0000 mg | ORAL_TABLET | Freq: Every day | ORAL | 1 refills | Status: DC

## 2023-11-05 NOTE — Progress Notes (Signed)
 Symptom Management Consult Note [facility]    Patient Care Team: Wilburn Handler, MD as PCP - General (Family Medicine) Maximo Spar Aviva Lemmings, MD as PCP - Cardiology (Cardiology) Marlene Simas, MD as Consulting Physician (Oncology)    Name / MRN / DOB: Morgan Torres  [id number]  08-31-1950   Date of visit: 11/05/2023   Chief Complaint/Reason for visit: low blood pressure       ASSESSMENT AND PLAN Patient is a 73 y.o. female with oncologic history of  Metastatic urothelial carcinoma followed by Dr. [name].  I have viewed most recent oncology note and lab work.  #Metastatic urothelial carcinoma  - Next appointment with oncologist is 11/12/23 -Scheduled to start new treatment Troldevy today. Treatment rescheduled for tomorrow after discussing hypotension with Dr. [name]   #Hypotension - Patient found to be hypotensive on arrival.  She is asymptomatic.  Further discussion with patient reveals she took her blood pressure medication today.  She does not remember the name of it and said she "does not take daily blood pressure medications." - Patient received 2 L of normal saline and blood pressure improved.    Patient able to ambulate in the infusion center and continues to be asymptomatic. - Advised patient not to take blood pressure medications and that she will need close follow up with cardiology to discuss medication management. Encouraged her to check blood pressure daily and keep a log as well.  Strict ED precautions discussed should symptoms worsen.   HEME/ONC HISTORY Oncology History  Metastatic urothelial carcinoma (HCC)  05/30/2022 Initial Diagnosis   Metastatic urothelial carcinoma (HCC)   06/21/2022 - 10/20/2022 Chemotherapy   Patient is on Treatment Plan : BLADDER Carboplatin  D1 + Gemcitabine  D1,8 q21d     11/14/2022 - 01/24/2023 Chemotherapy   Patient is on Treatment Plan : BLADDER Avelumab  q14d     08/16/2023 - 08/29/2023 Chemotherapy   Patient is on  Treatment Plan : UROTHELIAL LOCALLY ADVANCED, METASTATIC Enfortumab D1,8,15 q28d     11/06/2023 -  Chemotherapy   Patient is on Treatment Plan : bladder Sacituzumab govitecan-hziy Chaya Cord) D1,8 q21d     Bladder cancer metastasized to lung (HCC)  05/30/2022 Initial Diagnosis   Bladder cancer metastasized to lung (HCC)   05/30/2022 Cancer Staging   Staging form: Lung, AJCC 8th Edition - Clinical: Stage IVA (cT2a, cN0, cM1a) - Signed by Marlene Simas, MD on 05/30/2022   11/06/2023 -  Chemotherapy   Patient is on Treatment Plan : bladder Sacituzumab govitecan-hziy Chaya Cord) D1,8 q21d         INTERVAL HISTORY  Discussed the use of AI scribe software for clinical note transcription with the patient, who gave verbal consent to proceed.    Morgan Torres is a 73 y.o. female with oncologic history as above presenting to [facility] today with chief complaint of hypotension.  Patient presents unaccompanied to the infusion center today.  Patient arrived for first time treatment of Trodelvy.  On arrival she was found to be hypotensive 82/45.  Oncologist was notified and ordered 1 L of normal saline to be administered.  Patient received IV fluids and blood pressure only minimally improved.  On my assessment patient told that she is feeling fine.  She is disappointed that she is not having treatment today because of her low blood pressure.  Patient does have a history of hypertension and chart review shows medication list from OSH ion 09/12/23 includes metoprolol , amlodipine , and hydrochlorothiazide  for treatment of her hypertension.  A note from an admission in 06/2023 at [facility] has that amlodipine  was discontinued and metoprolol  dose was reduced. Patient has seen cardiologist Dr. Maximo Spar in the past.  Patient states she has not been taking her blood pressure medications "for awhile now" although today took one of them this morning. She also took an ativan  this morning PTA because she was anxious for treatment.  She denies any weakness, dizziness, chest pain.        ROS  All other systems are reviewed and are negative for acute change except as noted in the HPI.    Allergies  Allergen Reactions   Diclofenac Anaphylaxis   Padcev  [Enfortumab Vedotin ] Other (See Comments)    Rogue Clear Syndrome   Ciprofloxacin Rash   Codeine Itching   Hydrocodone Itching and Other (See Comments)    Skin broke out while taking Norco 7.5/325     Past Medical History:  Diagnosis Date   Anxiety    Arthritis    hip, lumbar spine    Asthma    seasonal allergies    Bronchitis    Hx: of   Chronic kidney disease    COPD (chronic obstructive pulmonary disease) (HCC)    Coronary artery disease    Depression    Fibromyalgia    Hypertension    Lumbar herniated disc    Metastatic urothelial carcinoma (HCC)    Myocardial infarction (HCC) 06/25/2012   followed by Dr. Maximo Spar, treated medically, no stents   Peripheral arterial disease (HCC)    of right foot   Pneumonia    Hx: of yrs ago   Sciatica    Sleep apnea    Small bowel obstruction (HCC)    several yrs ago     Past Surgical History:  Procedure Laterality Date   ABDOMINAL SURGERY     resection of small intestine   ANTERIOR CRUCIATE LIGAMENT REPAIR Bilateral    BACK SURGERY     fusion of lumbar   CARDIAC CATHETERIZATION  06/25/2012   COLON SURGERY     resection for bowel - for obstruction  6 to 7 yrs ago per pt on 01-05-2022   COLONOSCOPY     Hx; of   DILATION AND CURETTAGE OF UTERUS     EYE SURGERY Bilateral    cataracts removed   HERNIA REPAIR  02/23/1969   inguinal hernia    IR IMAGING GUIDED PORT INSERTION  06/07/2022   LEFT HEART CATHETERIZATION WITH CORONARY ANGIOGRAM N/A 03/30/2013   Procedure: LEFT HEART CATHETERIZATION WITH CORONARY ANGIOGRAM;  Surgeon: Arleen Lacer, MD;  Location: [facility] CATH LAB;  Service: Cardiovascular;  Laterality: N/A;   left thumb surgery     yrs ago   ROBOT ASSITED LAPAROSCOPIC NEPHROURETERECTOMY  Left 03/09/2021   Procedure: XI ROBOT ASSITED LAPAROSCOPIC NEPHROURETERECTOMY/ CYSTOSCOPY WITH LEFT URETEROSCOPY WITH TRANSURETHRAL RESECTION OF URETERAL ORIFICE;  Surgeon: Adelbert Homans, MD;  Location: [facility] ORS;  Service: Urology;  Laterality: Left;   SALIVARY GLAND SURGERY Left    approached from inside mouth & side of neck 1970-1980   TONSILLECTOMY     as child   TOTAL HIP ARTHROPLASTY Left 05/28/2014   Procedure: LEFT TOTAL HIP ARTHROPLASTY;  Surgeon: Forbes Ida., MD;  Location: [facility] OR;  Service: Orthopedics;  Laterality: Left;   TRANSURETHRAL RESECTION OF BLADDER TUMOR N/A 08/23/2021   Procedure: TRANSURETHRAL RESECTION OF BLADDER TUMOR (TURBT) WITH CYSTOSCOPY/ POSTOPERATIVE INSTILLATION OF GEMCITABINE /retrograde pyelogram and stent placement (right);  Surgeon: Adelbert Homans, MD;  Location: [facility];  Service: Urology;  Laterality: N/A;   TRANSURETHRAL RESECTION OF BLADDER TUMOR N/A 01/10/2022   Procedure: TRANSURETHRAL RESECTION OF BLADDER TUMOR (TURBT) WITH CYSTOSCOPY / GEMCITABINE  INSTILLATION post-operatively;  Surgeon: Adelbert Homans, MD;  Location: [facility];  Service: Urology;  Laterality: N/A;  ONLY NEEDS 30 MIN    Social History   Socioeconomic History   Marital status: Married    Spouse name: Not on file   Number of children: Not on file   Years of education: Not on file   Highest education level: Not on file  Occupational History   Not on file  Tobacco Use   Smoking status: Some Days    Current packs/day: 0.50    Average packs/day: 0.5 packs/day for 25.0 years (12.5 ttl pk-yrs)    Types: Cigarettes   Smokeless tobacco: Never   Tobacco comments:    Currently on the Nicoderm patch."smokes a few"  Vaping Use   Vaping status: Never Used  Substance and Sexual Activity   Alcohol  use: Yes    Comment: occasionally   Drug use: No   Sexual activity: Not Currently  Other Topics Concern   Not on file   Social History Narrative   Not on file   Social Drivers of Health   Financial Resource Strain: Not on file  Food Insecurity: Low Risk  (09/12/2023)   Received from [facility]   Hunger Vital Sign    Worried About Running Out of Food in the Last Year: Never true    Ran Out of Food in the Last Year: Never true  Transportation Needs: No Transportation Needs (09/24/2023)   Received from [facility]   PRAPARE - Transportation    Lack of Transportation (Medical): No    Lack of Transportation (Non-Medical): No  Physical Activity: Not on file  Stress: Not on file  Social Connections: Socially Integrated (09/02/2023)   Social Connection and Isolation Panel [NHANES]    Frequency of Communication with Friends and Family: Three times a week    Frequency of Social Gatherings with Friends and Family: Once a week    Attends Religious Services: More than 4 times per year    Active Member of [employer] or Organizations: Yes    Attends Engineer, structural: More than 4 times per year    Marital Status: Married  Catering manager Violence: Not At Risk (09/02/2023)   Humiliation, Afraid, Rape, and Kick questionnaire    Fear of Current or Ex-Partner: No    Emotionally Abused: No    Physically Abused: No    Sexually Abused: No    Family History  Problem Relation Age of Onset   Hypertension Mother    Diabetes Mother    Cancer - Other Mother    Cancer Sister    Breast cancer Daughter      Current Outpatient Medications:    acetaminophen  (TYLENOL ) 500 MG tablet, Take 1 tablet (500 mg total) by mouth every 6 (six) hours as needed for mild pain., Disp: 30 tablet, Rfl: 0   albuterol  (PROVENTIL  HFA;VENTOLIN  HFA) 108 (90 Base) MCG/ACT inhaler, Inhale 2 puffs into the lungs every 6 (six) hours as needed for wheezing or shortness of breath., Disp: 1 Inhaler, Rfl: 0   aspirin  EC 81 MG tablet, Take 81 mg by mouth daily. Swallow whole., Disp: , Rfl:    cilostazol  (PLETAL ) 50 MG tablet, Take 50 mg by  mouth 2 (two) times daily., Disp: , Rfl:  clotrimazole  (LOTRIMIN ) 1 % external solution, Apply topically 2 (two) times daily., Disp: 30 mL, Rfl: 0   dexamethasone  (DECADRON ) 4 MG tablet, Take 2 tablets (8 mg) daily for 3 days. Start the day after chemotherapy., Disp: 30 tablet, Rfl: 1   EPINEPHrine  (EPIPEN  2-PAK) 0.3 mg/0.3 mL DEVI, Inject 0.3 mLs (0.3 mg total) into the muscle once as needed (for severe allergic reaction). CAll 911 immediately if you have to use this medicine (Patient taking differently: Inject 0.3 mg into the muscle once as needed (for a severe allergic reaction- CALL 9-1-1 IMMEDIATELY IF USED).), Disp: 1 Device, Rfl: 1   gabapentin  (NEURONTIN ) 300 MG capsule, Take 300 mg by mouth at bedtime., Disp: , Rfl:    guaiFENesin  (MUCINEX ) 600 MG 12 hr tablet, Take 2 tablets (1,200 mg total) by mouth 2 (two) times daily. (Patient taking differently: Take 1,200 mg by mouth 2 (two) times daily as needed for to loosen phlegm.), Disp: 120 tablet, Rfl: 0   HYDROmorphone  (DILAUDID ) 1 MG/ML injection, Inject 1 mL (1 mg total) into the vein every 2 (two) hours as needed for severe pain (pain score 7-10)., Disp: , Rfl:    levothyroxine  (SYNTHROID ) 88 MCG tablet, Take 1 tablet (88 mcg total) by mouth daily before breakfast., Disp: 90 tablet, Rfl: 2   lidocaine -prilocaine  (EMLA ) cream, Apply 1 Application topically as needed., Disp: 30 g, Rfl: 2   loperamide  (IMODIUM ) 2 MG capsule, Take 2 tabs by mouth with first loose stool, then 1 tab with each additional loose stool as needed. Do not exceed 8 tabs in a 24-hour period, Disp: 60 capsule, Rfl: 3   LORazepam  (ATIVAN ) 0.5 MG tablet, Take 0.5 mg by mouth daily as needed for anxiety., Disp: , Rfl:    magnesium oxide (MAG-OX) 400 (240 Mg) MG tablet, Take 1 tablet (400 mg total) by mouth daily., Disp: 30 tablet, Rfl: 1   methylPREDNISolone  sodium succinate (SOLU-MEDROL ) 125 mg/2 mL injection, Inject 1.28 mLs (80 mg total) into the vein daily., Disp: , Rfl:     metoprolol  tartrate (LOPRESSOR ) 25 MG tablet, Take 1 tablet by mouth 2 (two) times daily., Disp: , Rfl:    MILK OF MAGNESIA 400 MG/5ML suspension, Take 15-30 mLs by mouth daily as needed for mild constipation., Disp: , Rfl:    naloxone  (NARCAN ) 2 MG/2ML injection, as needed for opioid reversal. Spray the contents of 1 device into 1 nostril. Call 911. May repeat with 2nd device in alternate nostril if no response in 2-3 minutes., Disp: , Rfl:    nicotine  (NICODERM CQ  - DOSED IN MG/24 HOURS) 21 mg/24hr patch, Place 21 mg onto the skin daily., Disp: , Rfl:    nicotine  polacrilex (NICORETTE ) 4 MG gum, Take 4 mg by mouth as needed for smoking cessation., Disp: , Rfl:    nitroGLYCERIN  (NITROSTAT ) 0.4 MG SL tablet, Place 1 tablet (0.4 mg total) under the tongue every 5 (five) minutes x 3 doses as needed for chest pain., Disp: 25 tablet, Rfl: 2   ondansetron  (ZOFRAN ) 8 MG tablet, Take 1 tablet (8 mg total) by mouth every 8 (eight) hours as needed for nausea or vomiting. Start on the third day after chemotherapy., Disp: 30 tablet, Rfl: 1   oxybutynin  (DITROPAN ) 5 MG tablet, Take 1 tablet (5 mg total) by mouth every 8 (eight) hours as needed for bladder spasms. (Patient not taking: Reported on 09/02/2023), Disp: 30 tablet, Rfl: 1   polyethylene glycol (MIRALAX  / GLYCOLAX ) 17 g packet, Take 17 g by mouth  as needed for mild constipation (mix as directed)., Disp: , Rfl:    sodium chloride  1 g tablet, Take 1 tablet (1 g total) by mouth daily. Split 1 gram into two halves and take by mouth once a day, Disp: 30 tablet, Rfl: 0   temazepam  (RESTORIL ) 15 MG capsule, Take 1 capsule (15 mg total) by mouth at bedtime as needed for sleep., Disp: 30 capsule, Rfl: 0   traMADol  (ULTRAM ) 50 MG tablet, Take 50 mg by mouth every 6 (six) hours as needed (for pain)., Disp: , Rfl:  No current facility-administered medications for this visit.  Facility-Administered Medications Ordered in Other Visits:    0.9 %  sodium chloride   infusion, , Intravenous, Continuous, Marlene Simas, MD   acetaminophen  (TYLENOL ) tablet 650 mg, 650 mg, Oral, Once, Marlene Simas, MD   atropine injection 0.5 mg, 0.5 mg, Intravenous, Once PRN, Marlene Simas, MD   dexamethasone  (DECADRON ) injection 10 mg, 10 mg, Intravenous, Once, Marlene Simas, MD   diphenhydrAMINE  (BENADRYL ) injection 50 mg, 50 mg, Intravenous, Once, Marlene Simas, MD   famotidine  (PEPCID ) IVPB 20 mg premix, 20 mg, Intravenous, Once, Marlene Simas, MD   fosaprepitant  (EMEND) 150 mg in sodium chloride  0.9 % 145 mL IVPB, 150 mg, Intravenous, Once, Marlene Simas, MD   palonosetron  (ALOXI ) injection 0.25 mg, 0.25 mg, Intravenous, Once, Marlene Simas, MD   sodium chloride  flush (NS) 0.9 % injection 10 mL, 10 mL, Intracatheter, PRN, Marlene Simas, MD, 10 mL at 11/05/23 1122  PHYSICAL EXAM ECOG FS:0 - Asymptomatic    Vitals:   11/05/23 0836 11/05/23 1107  BP: (!) 82/45 (!) 124/55  Pulse: 79 70  Resp: 17 18  Temp: 98 F (36.7 C)   TempSrc: Oral   SpO2: 97% 94%   Physical Exam Vitals and nursing note reviewed.  Constitutional:      Appearance: She is not ill-appearing or toxic-appearing.  HENT:     Head: Normocephalic.  Eyes:     Conjunctiva/sclera: Conjunctivae normal.  Cardiovascular:     Rate and Rhythm: Normal rate and regular rhythm.     Pulses: Normal pulses.     Heart sounds: Normal heart sounds.  Pulmonary:     Effort: Pulmonary effort is normal.     Breath sounds: Normal breath sounds.  Abdominal:     General: There is no distension.  Musculoskeletal:     Cervical back: Normal range of motion.  Skin:    General: Skin is warm and dry.  Neurological:     Mental Status: She is alert and oriented to person, place, and time.        LABORATORY DATA I have reviewed the data as listed    Latest Ref Rng & Units 11/05/2023    7:35 AM 10/29/2023    1:41 PM 09/26/2023    9:05 AM  CBC  WBC 4.0 - 10.5 K/uL 5.5  6.1  10.0    Hemoglobin 12.0 - 15.0 g/dL 82.9  56.2  13.0   Hematocrit 36.0 - 46.0 % 33.8  38.5  32.5   Platelets 150 - 400 K/uL 268  234  306         Latest Ref Rng & Units 11/05/2023    7:35 AM 10/29/2023    1:41 PM 09/26/2023    9:05 AM  CMP  Glucose 70 - 99 mg/dL 98  95  865   BUN 8 - 23 mg/dL 24  14  19    Creatinine 0.44 - 1.00 mg/dL 7.84  1.41  1.37   Sodium 135 - 145 mmol/L 136  138  133   Potassium 3.5 - 5.1 mmol/L 4.2  4.4  4.4   Chloride 98 - 111 mmol/L 102  106  101   CO2 22 - 32 mmol/L 28  28  22    Calcium  8.9 - 10.3 mg/dL 8.8  8.9  8.5   Total Protein 6.5 - 8.1 g/dL 6.2  6.4  6.4   Total Bilirubin 0.0 - 1.2 mg/dL 0.3  0.3  0.3   Alkaline Phos 38 - 126 U/L 69  70  64   AST 15 - 41 U/L 15  16  17    ALT 0 - 44 U/L 10  13  21         RADIOGRAPHIC STUDIES (from last 24 hours if applicable) I have personally reviewed the radiological images as listed and agreed with the findings in the report. No results found.      Visit Diagnosis: 1. Metastatic urothelial carcinoma (HCC)   2. Hypotension, unspecified hypotension type      No orders of the defined types were placed in this encounter.   All questions were answered. The patient knows to call the clinic with any problems, questions or concerns. No barriers to learning was detected.  A total of more than 30 minutes were spent on this encounter with face-to-face time and non-face-to-face time, including preparing to see the patient, ordering tests and/or medications, counseling the patient and coordination of care as outlined above.    Thank you for allowing me to participate in the care of this patient.    Tida Saner E  Walisiewicz, PA-C Department of Hematology/Oncology [facility] at [facility] Phone: [phone]  [phone]    11/05/2023 3:56 PM

## 2023-11-05 NOTE — Progress Notes (Signed)
 Per Dr Liam Redhead. Hold on treatment until BP increases.  Order received for 1L NS and re-check BP prior to continuing.  Thornton Flock, RN notified.

## 2023-11-05 NOTE — Progress Notes (Signed)
 Per [name], ok to treat with BP 105/54. Pt advised to schedule appt with primary care to re-assess need for BP medication.   Per [name] MD ok to treat with SCR 1.66.

## 2023-11-05 NOTE — Addendum Note (Signed)
 Addended by: Bonnie Butters on: 11/05/2023 04:39 PM   Modules accepted: Orders

## 2023-11-05 NOTE — Telephone Encounter (Signed)
 I instructed Morgan Torres to call Dr Genita Keys today about her hypotension episode today and he needs to review all her BP med.

## 2023-11-05 NOTE — Patient Instructions (Signed)

## 2023-11-05 NOTE — Patient Instructions (Addendum)
 Stop taking your blood pressure medicine Metoprolol . You should check your blood pressure daily and keep a written log of this.  We recommend following up with your primary care provider or cardiologist to further discuss your blood pressure.

## 2023-11-06 ENCOUNTER — Encounter: Payer: Self-pay | Admitting: Medical Oncology

## 2023-11-06 ENCOUNTER — Ambulatory Visit: Admitting: Physical Therapy

## 2023-11-06 ENCOUNTER — Other Ambulatory Visit: Payer: Self-pay

## 2023-11-06 ENCOUNTER — Other Ambulatory Visit: Payer: Self-pay | Admitting: Physician Assistant

## 2023-11-06 ENCOUNTER — Inpatient Hospital Stay

## 2023-11-06 VITALS — BP 109/61 | HR 73 | Temp 98.0°F | Resp 18

## 2023-11-06 DIAGNOSIS — C771 Secondary and unspecified malignant neoplasm of intrathoracic lymph nodes: Secondary | ICD-10-CM | POA: Diagnosis not present

## 2023-11-06 DIAGNOSIS — Z881 Allergy status to other antibiotic agents status: Secondary | ICD-10-CM | POA: Diagnosis not present

## 2023-11-06 DIAGNOSIS — I2510 Atherosclerotic heart disease of native coronary artery without angina pectoris: Secondary | ICD-10-CM | POA: Diagnosis not present

## 2023-11-06 DIAGNOSIS — Z885 Allergy status to narcotic agent status: Secondary | ICD-10-CM | POA: Diagnosis not present

## 2023-11-06 DIAGNOSIS — Z5112 Encounter for antineoplastic immunotherapy: Secondary | ICD-10-CM | POA: Diagnosis not present

## 2023-11-06 DIAGNOSIS — G8929 Other chronic pain: Secondary | ICD-10-CM | POA: Diagnosis not present

## 2023-11-06 DIAGNOSIS — R5383 Other fatigue: Secondary | ICD-10-CM | POA: Diagnosis not present

## 2023-11-06 DIAGNOSIS — I700 Atherosclerosis of aorta: Secondary | ICD-10-CM | POA: Diagnosis not present

## 2023-11-06 DIAGNOSIS — Z79899 Other long term (current) drug therapy: Secondary | ICD-10-CM | POA: Diagnosis not present

## 2023-11-06 DIAGNOSIS — C7800 Secondary malignant neoplasm of unspecified lung: Secondary | ICD-10-CM

## 2023-11-06 DIAGNOSIS — L513 Stevens-Johnson syndrome-toxic epidermal necrolysis overlap syndrome: Secondary | ICD-10-CM | POA: Diagnosis not present

## 2023-11-06 DIAGNOSIS — E038 Other specified hypothyroidism: Secondary | ICD-10-CM | POA: Diagnosis not present

## 2023-11-06 DIAGNOSIS — T465X6A Underdosing of other antihypertensive drugs, initial encounter: Secondary | ICD-10-CM | POA: Diagnosis not present

## 2023-11-06 DIAGNOSIS — Z7982 Long term (current) use of aspirin: Secondary | ICD-10-CM | POA: Diagnosis not present

## 2023-11-06 DIAGNOSIS — I252 Old myocardial infarction: Secondary | ICD-10-CM | POA: Diagnosis not present

## 2023-11-06 DIAGNOSIS — Z9089 Acquired absence of other organs: Secondary | ICD-10-CM | POA: Diagnosis not present

## 2023-11-06 DIAGNOSIS — M549 Dorsalgia, unspecified: Secondary | ICD-10-CM | POA: Diagnosis not present

## 2023-11-06 DIAGNOSIS — N1831 Chronic kidney disease, stage 3a: Secondary | ICD-10-CM | POA: Diagnosis not present

## 2023-11-06 DIAGNOSIS — Z7902 Long term (current) use of antithrombotics/antiplatelets: Secondary | ICD-10-CM | POA: Diagnosis not present

## 2023-11-06 DIAGNOSIS — C679 Malignant neoplasm of bladder, unspecified: Secondary | ICD-10-CM | POA: Diagnosis not present

## 2023-11-06 DIAGNOSIS — I129 Hypertensive chronic kidney disease with stage 1 through stage 4 chronic kidney disease, or unspecified chronic kidney disease: Secondary | ICD-10-CM | POA: Diagnosis not present

## 2023-11-06 DIAGNOSIS — C7910 Secondary malignant neoplasm of unspecified urinary organs: Secondary | ICD-10-CM

## 2023-11-06 DIAGNOSIS — J439 Emphysema, unspecified: Secondary | ICD-10-CM | POA: Diagnosis not present

## 2023-11-06 DIAGNOSIS — F17210 Nicotine dependence, cigarettes, uncomplicated: Secondary | ICD-10-CM | POA: Diagnosis not present

## 2023-11-06 MED ORDER — DIPHENHYDRAMINE HCL 50 MG/ML IJ SOLN
50.0000 mg | Freq: Once | INTRAMUSCULAR | Status: AC
  Administered 2023-11-06: 50 mg via INTRAVENOUS
  Filled 2023-11-06: qty 1

## 2023-11-06 MED ORDER — SODIUM CHLORIDE 0.9 % IV SOLN
10.0000 mg/kg | Freq: Once | INTRAVENOUS | Status: AC
  Administered 2023-11-06: 720 mg via INTRAVENOUS
  Filled 2023-11-06: qty 72

## 2023-11-06 MED ORDER — SODIUM CHLORIDE 0.9 % IV SOLN
150.0000 mg | Freq: Once | INTRAVENOUS | Status: AC
  Administered 2023-11-06: 150 mg via INTRAVENOUS
  Filled 2023-11-06: qty 150

## 2023-11-06 MED ORDER — ACETAMINOPHEN 325 MG PO TABS
650.0000 mg | ORAL_TABLET | Freq: Once | ORAL | Status: AC
  Administered 2023-11-06: 650 mg via ORAL
  Filled 2023-11-06: qty 2

## 2023-11-06 MED ORDER — ATROPINE SULFATE 1 MG/ML IV SOLN
0.5000 mg | Freq: Once | INTRAVENOUS | Status: DC | PRN
  Filled 2023-11-06: qty 1

## 2023-11-06 MED ORDER — HEPARIN SOD (PORK) LOCK FLUSH 100 UNIT/ML IV SOLN
500.0000 [IU] | Freq: Once | INTRAVENOUS | Status: AC | PRN
  Administered 2023-11-06: 500 [IU]

## 2023-11-06 MED ORDER — SODIUM CHLORIDE 0.9 % IV SOLN: INTRAVENOUS | Status: DC

## 2023-11-06 MED ORDER — PROCHLORPERAZINE MALEATE 10 MG PO TABS: 10.0000 mg | ORAL_TABLET | Freq: Four times a day (QID) | ORAL | 2 refills | Status: AC | PRN

## 2023-11-06 MED ORDER — FAMOTIDINE IN NACL 20-0.9 MG/50ML-% IV SOLN
20.0000 mg | Freq: Once | INTRAVENOUS | Status: AC
  Administered 2023-11-06: 20 mg via INTRAVENOUS
  Filled 2023-11-06: qty 50

## 2023-11-06 MED ORDER — SODIUM CHLORIDE 0.9% FLUSH
10.0000 mL | INTRAVENOUS | Status: DC | PRN
  Administered 2023-11-06: 10 mL

## 2023-11-06 MED ORDER — PALONOSETRON HCL INJECTION 0.25 MG/5ML
0.2500 mg | Freq: Once | INTRAVENOUS | Status: AC
  Administered 2023-11-06: 0.25 mg via INTRAVENOUS
  Filled 2023-11-06: qty 5

## 2023-11-06 MED ORDER — DEXAMETHASONE SODIUM PHOSPHATE 10 MG/ML IJ SOLN
10.0000 mg | Freq: Once | INTRAMUSCULAR | Status: AC
  Administered 2023-11-06: 10 mg via INTRAVENOUS
  Filled 2023-11-06: qty 1

## 2023-11-06 NOTE — Progress Notes (Signed)
 Per Dr. [name] ,it is ok to treat pt today with Sacituzumab govitecan-hziy and creatinine =1.66.

## 2023-11-06 NOTE — Patient Instructions (Signed)
 [facility] - A DEPT OF [facility]  Discharge Instructions: Thank you for choosing [facility] to provide your oncology and hematology care.   If you have a lab appointment with the Cancer Center, please go directly to the Cancer Center and check in at the registration area.   Wear comfortable clothing and clothing appropriate for easy access to any Portacath or PICC line.   We strive to give you quality time with your provider. You may need to reschedule your appointment if you arrive late (15 or more minutes).  Arriving late affects you and other patients whose appointments are after yours.  Also, if you miss three or more appointments without notifying the office, you may be dismissed from the clinic at the provider's discretion.      For prescription refill requests, have your pharmacy contact our office and allow 72 hours for refills to be completed.    Today you received the following chemotherapy and/or immunotherapy agents: Trodelvy.      To help prevent nausea and vomiting after your treatment, we encourage you to take your nausea medication as directed.  BELOW ARE SYMPTOMS THAT SHOULD BE REPORTED IMMEDIATELY: *FEVER GREATER THAN 100.4 F (38 C) OR HIGHER *CHILLS OR SWEATING *NAUSEA AND VOMITING THAT IS NOT CONTROLLED WITH YOUR NAUSEA MEDICATION *UNUSUAL SHORTNESS OF BREATH *UNUSUAL BRUISING OR BLEEDING *URINARY PROBLEMS (pain or burning when urinating, or frequent urination) *BOWEL PROBLEMS (unusual diarrhea, constipation, pain near the anus) TENDERNESS IN MOUTH AND THROAT WITH OR WITHOUT PRESENCE OF ULCERS (sore throat, sores in mouth, or a toothache) UNUSUAL RASH, SWELLING OR PAIN  UNUSUAL VAGINAL DISCHARGE OR ITCHING   Items with * indicate a potential emergency and should be followed up as soon as possible or go to the Emergency Department if any problems should occur.  Please show the CHEMOTHERAPY ALERT CARD or IMMUNOTHERAPY  ALERT CARD at check-in to the Emergency Department and triage nurse.  Should you have questions after your visit or need to cancel or reschedule your appointment, please contact [facility] - A DEPT OF [facility]  Dept: [phone]  and follow the prompts.  Office hours are 8:00 a.m. to 4:30 p.m. Monday - Friday. Please note that voicemails left after 4:00 p.m. may not be returned until the following business day.  We are closed weekends and major holidays. You have access to a nurse at all times for urgent questions. Please call the main number to the clinic Dept: [phone] and follow the prompts.   For any non-urgent questions, you may also contact your provider using MyChart. We now offer e-Visits for anyone 76 and older to request care online for non-urgent symptoms. For details visit [email or web address].   Also download the MyChart app! Go to the app store, search "MyChart", open the app, select [place], and log in with your MyChart username and password.  Sacituzumab Govitecan Injection What is this medication? SACITUZUMAB GOVETECAN (SAK i TOOZ ue mab GOE vi TEE can) treats breast cancer. It works by blocking a protein that causes cancer cells to grow and multiply. This helps to slow or stop the spread of cancer cells. This medicine may be used for other purposes; ask your health care provider or pharmacist if you have questions. COMMON BRAND NAME(S): TRODELVY What should I tell my care team before I take this medication? They need to know if you have any of these conditions:  Carry the UGT1A1*28 gene Infection Liver disease An unusual or allergic reaction to sacituzumab govitecan, other medications, foods, dyes, or preservatives Pregnant or trying to get pregnant Breast-feeding How should I use this medication? This medication is injected into a vein. It is given by your care team in a hospital or clinic setting. Talk to your care team about  the use of this medication in children. Special care may be needed. Overdosage: If you think you have taken too much of this medicine contact a poison control center or emergency room at once. NOTE: This medicine is only for you. Do not share this medicine with others. What if I miss a dose? Keep appointments for follow-up doses. It is important not to miss your dose. Call your care team if you are unable to keep an appointment. What may interact with this medication? This medication may affect how other medications work, and other medications may affect the way this medication works. Talk with your care team about all of the medications you take. They may suggest changes to your treatment plan to lower the risk of side effects and to make sure your medications work as intended. This list may not describe all possible interactions. Give your health care provider a list of all the medicines, herbs, non-prescription drugs, or dietary supplements you use. Also tell them if you smoke, drink alcohol, or use illegal drugs. Some items may interact with your medicine. What should I watch for while using this medication? This medication may make you feel generally unwell. This is not uncommon as chemotherapy can affect healthy cells as well as cancer cells. Report any side effects. Continue your course of treatment even though you feel ill unless your care team tells you to stop. You may need blood work while you are taking this medication. Certain genetic factors may decrease the safety of this medication. Your care team may use genetic tests to determine treatment. This medication can cause serious allergic reactions. To reduce your risk, your care team may give you other medications to take before receiving this one. Be sure to follow the directions from your care team. Check with your care team if you have severe diarrhea, nausea, and vomiting, or if you sweat a lot. The loss of too much body fluid may make  it dangerous for you to take this medication. Talk to your care team if you wish to become pregnant or think you might be pregnant. This medication can cause serious birth defects if taken during pregnancy or if you get pregnant within 6 months after stopping treatment. A negative pregnancy test is required before starting this medication. A reliable form of contraception is recommended while taking this medication and for 6 months after stopping treatment. Talk to your care team about reliable forms of contraception. Use a condom during sex and for 3 months after stopping treatment. Tell your care team right away if you think your partner might be pregnant. This medication can cause serious birth defects. Do not breast-feed while taking this medication and for 1 month after stopping therapy. This medication may cause infertility. Talk to your care team if you are concerned about your fertility. This medication may increase your risk of getting an infection. Call your care team for advice if you get a fever, chills, sore throat, or other symptoms of a cold or flu. Do not treat yourself. Try to avoid being around people who are sick. Avoid taking medications that contain aspirin, acetaminophen, ibuprofen, naproxen, or ketoprofen  unless instructed by your care team. These medications may hide a fever. This medication may increase blood sugar. The risk may be higher in patients who already have diabetes. Ask your care team what you can do to lower your risk of diabetes while taking this medication. What side effects may I notice from receiving this medication? Side effects that you should report to your care team as soon as possible: Allergic reactions--skin rash, itching, hives, swelling of the face, lips, tongue, or throat Infection--fever, chills, cough, or sore throat Infusion reactions--chest pain, shortness of breath or trouble breathing, feeling faint or lightheaded Low red blood cell level--unusual  weakness or fatigue, dizziness, headache, trouble breathing Severe or prolonged diarrhea Side effects that usually do not require medical attention (report these to your care team if they continue or are bothersome): Constipation Diarrhea Fatigue Hair loss Loss of appetite Nausea Vomiting This list may not describe all possible side effects. Call your doctor for medical advice about side effects. You may report side effects to FDA at 1-800-FDA-1088. Where should I keep my medication? This medication is given in a hospital or clinic. It will not be stored at home. NOTE: This sheet is a summary. It may not cover all possible information. If you have questions about this medicine, talk to your doctor, pharmacist, or health care provider.  2024 Elsevier/Gold Standard (2023-05-24 00:00:00)

## 2023-11-07 ENCOUNTER — Other Ambulatory Visit: Payer: Medicare Other

## 2023-11-07 ENCOUNTER — Telehealth: Payer: Self-pay

## 2023-11-07 ENCOUNTER — Ambulatory Visit: Payer: Medicare Other | Admitting: Internal Medicine

## 2023-11-07 ENCOUNTER — Ambulatory Visit: Payer: Medicare Other

## 2023-11-07 NOTE — Telephone Encounter (Signed)
-----   Message from Nurse Elijah Guadalajara sent at 11/06/2023  4:23 PM EDT ----- Regarding: [name] 1st Tx F/U call - Morgan Torres 1st Tx F/U call - trodelvy.  Patient tolerated well.

## 2023-11-07 NOTE — Telephone Encounter (Signed)
 Morgan Torres states that she is doing fine. She is eating, drinking, and urinating well. She knows to call the office at [phone] if  she has any questions or concerns.

## 2023-11-11 ENCOUNTER — Encounter: Payer: Self-pay | Admitting: Physical Therapy

## 2023-11-11 ENCOUNTER — Ambulatory Visit: Admitting: Physical Therapy

## 2023-11-11 VITALS — BP 133/60 | HR 79

## 2023-11-11 DIAGNOSIS — L270 Generalized skin eruption due to drugs and medicaments taken internally: Secondary | ICD-10-CM | POA: Diagnosis not present

## 2023-11-11 DIAGNOSIS — C679 Malignant neoplasm of bladder, unspecified: Secondary | ICD-10-CM | POA: Diagnosis not present

## 2023-11-11 DIAGNOSIS — E039 Hypothyroidism, unspecified: Secondary | ICD-10-CM | POA: Diagnosis not present

## 2023-11-11 DIAGNOSIS — R2689 Other abnormalities of gait and mobility: Secondary | ICD-10-CM

## 2023-11-11 DIAGNOSIS — M6281 Muscle weakness (generalized): Secondary | ICD-10-CM

## 2023-11-11 DIAGNOSIS — R2681 Unsteadiness on feet: Secondary | ICD-10-CM

## 2023-11-11 DIAGNOSIS — D631 Anemia in chronic kidney disease: Secondary | ICD-10-CM | POA: Diagnosis not present

## 2023-11-11 DIAGNOSIS — I129 Hypertensive chronic kidney disease with stage 1 through stage 4 chronic kidney disease, or unspecified chronic kidney disease: Secondary | ICD-10-CM | POA: Diagnosis not present

## 2023-11-11 DIAGNOSIS — C7911 Secondary malignant neoplasm of bladder: Secondary | ICD-10-CM | POA: Diagnosis not present

## 2023-11-11 DIAGNOSIS — N1831 Chronic kidney disease, stage 3a: Secondary | ICD-10-CM | POA: Diagnosis not present

## 2023-11-11 DIAGNOSIS — M545 Low back pain, unspecified: Secondary | ICD-10-CM | POA: Diagnosis not present

## 2023-11-11 DIAGNOSIS — J449 Chronic obstructive pulmonary disease, unspecified: Secondary | ICD-10-CM | POA: Diagnosis not present

## 2023-11-11 MED FILL — Fosaprepitant Dimeglumine For IV Infusion 150 MG (Base Eq): INTRAVENOUS | Qty: 5 | Status: AC
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
## 2023-11-12 ENCOUNTER — Inpatient Hospital Stay

## 2023-11-12 ENCOUNTER — Other Ambulatory Visit: Payer: Self-pay | Admitting: Internal Medicine

## 2023-11-12 ENCOUNTER — Inpatient Hospital Stay: Admitting: Internal Medicine

## 2023-11-12 VITALS — BP 120/61 | HR 74 | Resp 16

## 2023-11-12 VITALS — BP 110/71 | HR 87 | Temp 98.1°F | Resp 17 | Ht 68.0 in | Wt 170.6 lb

## 2023-11-12 DIAGNOSIS — C679 Malignant neoplasm of bladder, unspecified: Secondary | ICD-10-CM

## 2023-11-12 DIAGNOSIS — L513 Stevens-Johnson syndrome-toxic epidermal necrolysis overlap syndrome: Secondary | ICD-10-CM | POA: Diagnosis not present

## 2023-11-12 DIAGNOSIS — Z881 Allergy status to other antibiotic agents status: Secondary | ICD-10-CM | POA: Diagnosis not present

## 2023-11-12 DIAGNOSIS — Z885 Allergy status to narcotic agent status: Secondary | ICD-10-CM | POA: Diagnosis not present

## 2023-11-12 DIAGNOSIS — F17210 Nicotine dependence, cigarettes, uncomplicated: Secondary | ICD-10-CM | POA: Diagnosis not present

## 2023-11-12 DIAGNOSIS — C7910 Secondary malignant neoplasm of unspecified urinary organs: Secondary | ICD-10-CM

## 2023-11-12 DIAGNOSIS — I129 Hypertensive chronic kidney disease with stage 1 through stage 4 chronic kidney disease, or unspecified chronic kidney disease: Secondary | ICD-10-CM | POA: Diagnosis not present

## 2023-11-12 DIAGNOSIS — C771 Secondary and unspecified malignant neoplasm of intrathoracic lymph nodes: Secondary | ICD-10-CM | POA: Diagnosis not present

## 2023-11-12 DIAGNOSIS — Z7902 Long term (current) use of antithrombotics/antiplatelets: Secondary | ICD-10-CM | POA: Diagnosis not present

## 2023-11-12 DIAGNOSIS — G8929 Other chronic pain: Secondary | ICD-10-CM | POA: Diagnosis not present

## 2023-11-12 DIAGNOSIS — C7800 Secondary malignant neoplasm of unspecified lung: Secondary | ICD-10-CM

## 2023-11-12 DIAGNOSIS — Z95828 Presence of other vascular implants and grafts: Secondary | ICD-10-CM

## 2023-11-12 DIAGNOSIS — R5383 Other fatigue: Secondary | ICD-10-CM | POA: Diagnosis not present

## 2023-11-12 DIAGNOSIS — E038 Other specified hypothyroidism: Secondary | ICD-10-CM | POA: Diagnosis not present

## 2023-11-12 DIAGNOSIS — Z7982 Long term (current) use of aspirin: Secondary | ICD-10-CM | POA: Diagnosis not present

## 2023-11-12 DIAGNOSIS — Z5112 Encounter for antineoplastic immunotherapy: Secondary | ICD-10-CM | POA: Diagnosis not present

## 2023-11-12 DIAGNOSIS — Z79899 Other long term (current) drug therapy: Secondary | ICD-10-CM | POA: Diagnosis not present

## 2023-11-12 DIAGNOSIS — T465X6A Underdosing of other antihypertensive drugs, initial encounter: Secondary | ICD-10-CM | POA: Diagnosis not present

## 2023-11-12 DIAGNOSIS — J439 Emphysema, unspecified: Secondary | ICD-10-CM | POA: Diagnosis not present

## 2023-11-12 DIAGNOSIS — I700 Atherosclerosis of aorta: Secondary | ICD-10-CM | POA: Diagnosis not present

## 2023-11-12 DIAGNOSIS — E039 Hypothyroidism, unspecified: Secondary | ICD-10-CM

## 2023-11-12 DIAGNOSIS — Z9089 Acquired absence of other organs: Secondary | ICD-10-CM | POA: Diagnosis not present

## 2023-11-12 DIAGNOSIS — M549 Dorsalgia, unspecified: Secondary | ICD-10-CM | POA: Diagnosis not present

## 2023-11-12 DIAGNOSIS — N1831 Chronic kidney disease, stage 3a: Secondary | ICD-10-CM | POA: Diagnosis not present

## 2023-11-12 DIAGNOSIS — I2510 Atherosclerotic heart disease of native coronary artery without angina pectoris: Secondary | ICD-10-CM | POA: Diagnosis not present

## 2023-11-12 DIAGNOSIS — D6481 Anemia due to antineoplastic chemotherapy: Secondary | ICD-10-CM

## 2023-11-12 DIAGNOSIS — I252 Old myocardial infarction: Secondary | ICD-10-CM | POA: Diagnosis not present

## 2023-11-12 LAB — CMP (CANCER CENTER ONLY)
ALT: 10 U/L (ref 0–44)
AST: 13 U/L — ABNORMAL LOW (ref 15–41)
Albumin: 3.8 g/dL (ref 3.5–5.0)
Alkaline Phosphatase: 79 U/L (ref 38–126)
Anion gap: 6 (ref 5–15)
BUN: 26 mg/dL — ABNORMAL HIGH (ref 8–23)
CO2: 30 mmol/L (ref 22–32)
Calcium: 9.1 mg/dL (ref 8.9–10.3)
Chloride: 101 mmol/L (ref 98–111)
Creatinine: 1.47 mg/dL — ABNORMAL HIGH (ref 0.44–1.00)
GFR, Estimated: 37 mL/min — ABNORMAL LOW (ref >60)
Glucose, Bld: 111 mg/dL — ABNORMAL HIGH (ref 70–99)
Potassium: 4.3 mmol/L (ref 3.5–5.1)
Sodium: 137 mmol/L (ref 135–145)
Total Bilirubin: 0.2 mg/dL (ref 0.0–1.2)
Total Protein: 6.5 g/dL (ref 6.5–8.1)

## 2023-11-12 LAB — CBC WITH DIFFERENTIAL (CANCER CENTER ONLY)
Abs Immature Granulocytes: 0.05 10*3/uL (ref 0.00–0.07)
Basophils Absolute: 0 10*3/uL (ref 0.0–0.1)
Basophils Relative: 1 %
Eosinophils Absolute: 0 10*3/uL (ref 0.0–0.5)
Eosinophils Relative: 1 %
HCT: 35.2 % — ABNORMAL LOW (ref 36.0–46.0)
Hemoglobin: 11.8 g/dL — ABNORMAL LOW (ref 12.0–15.0)
Immature Granulocytes: 1 %
Lymphocytes Relative: 53 %
Lymphs Abs: 1.9 10*3/uL (ref 0.7–4.0)
MCH: 32.6 pg (ref 26.0–34.0)
MCHC: 33.5 g/dL (ref 30.0–36.0)
MCV: 97.2 fL (ref 80.0–100.0)
Monocytes Absolute: 0.2 10*3/uL (ref 0.1–1.0)
Monocytes Relative: 6 %
Neutro Abs: 1.4 10*3/uL — ABNORMAL LOW (ref 1.7–7.7)
Neutrophils Relative %: 38 %
Platelet Count: 266 10*3/uL (ref 150–400)
RBC: 3.62 MIL/uL — ABNORMAL LOW (ref 3.87–5.11)
RDW: 14 % (ref 11.5–15.5)
WBC Count: 3.6 10*3/uL — ABNORMAL LOW (ref 4.0–10.5)
nRBC: 0 % (ref 0.0–0.2)

## 2023-11-12 LAB — PHOSPHORUS: Phosphorus: 3.7 mg/dL (ref 2.5–4.6)

## 2023-11-12 LAB — SAMPLE TO BLOOD BANK

## 2023-11-12 LAB — MAGNESIUM: Magnesium: 1.7 mg/dL (ref 1.7–2.4)

## 2023-11-12 LAB — TSH: TSH: 40.1 u[IU]/mL — ABNORMAL HIGH (ref 0.350–4.500)

## 2023-11-12 MED ORDER — ACETAMINOPHEN 325 MG PO TABS
650.0000 mg | ORAL_TABLET | Freq: Once | ORAL | Status: AC
  Administered 2023-11-12: 650 mg via ORAL
  Filled 2023-11-12: qty 2

## 2023-11-12 MED ORDER — PALONOSETRON HCL INJECTION 0.25 MG/5ML
0.2500 mg | Freq: Once | INTRAVENOUS | Status: AC
  Administered 2023-11-12: 0.25 mg via INTRAVENOUS
  Filled 2023-11-12: qty 5

## 2023-11-12 MED ORDER — FAMOTIDINE IN NACL 20-0.9 MG/50ML-% IV SOLN
20.0000 mg | Freq: Once | INTRAVENOUS | Status: AC
  Administered 2023-11-12: 20 mg via INTRAVENOUS
  Filled 2023-11-12: qty 50

## 2023-11-12 MED ORDER — SODIUM CHLORIDE 0.9% FLUSH
10.0000 mL | INTRAVENOUS | Status: DC | PRN
  Administered 2023-11-12: 10 mL

## 2023-11-12 MED ORDER — SODIUM CHLORIDE 0.9 % IV SOLN
10.0000 mg/kg | Freq: Once | INTRAVENOUS | Status: AC
  Administered 2023-11-12: 720 mg via INTRAVENOUS
  Filled 2023-11-12: qty 72

## 2023-11-12 MED ORDER — DEXAMETHASONE SODIUM PHOSPHATE 10 MG/ML IJ SOLN
10.0000 mg | Freq: Once | INTRAMUSCULAR | Status: AC
  Administered 2023-11-12: 10 mg via INTRAVENOUS
  Filled 2023-11-12: qty 1

## 2023-11-12 MED ORDER — SODIUM CHLORIDE 0.9 % IV SOLN
150.0000 mg | Freq: Once | INTRAVENOUS | Status: AC
  Administered 2023-11-12: 150 mg via INTRAVENOUS
  Filled 2023-11-12: qty 150

## 2023-11-12 MED ORDER — HEPARIN SOD (PORK) LOCK FLUSH 100 UNIT/ML IV SOLN
500.0000 [IU] | Freq: Once | INTRAVENOUS | Status: AC | PRN
  Administered 2023-11-12: 500 [IU]

## 2023-11-12 MED ORDER — DIPHENHYDRAMINE HCL 50 MG/ML IJ SOLN
50.0000 mg | Freq: Once | INTRAMUSCULAR | Status: AC
  Administered 2023-11-12: 50 mg via INTRAVENOUS
  Filled 2023-11-12: qty 1

## 2023-11-12 MED ORDER — LEVOTHYROXINE SODIUM 100 MCG PO TABS: 100.0000 ug | ORAL_TABLET | Freq: Every day | ORAL | 1 refills | Status: DC

## 2023-11-12 MED ORDER — SODIUM CHLORIDE 0.9 % IV SOLN
INTRAVENOUS | Status: DC
Start: 2023-11-12 — End: 2023-11-12

## 2023-11-12 MED ORDER — SODIUM CHLORIDE 0.9% FLUSH
10.0000 mL | Freq: Once | INTRAVENOUS | Status: AC
  Administered 2023-11-12: 10 mL

## 2023-11-12 NOTE — Progress Notes (Signed)
 [facility] [phone]   [phone]  OFFICE PROGRESS NOTE  Wilburn Handler, MD [address] [place] [address]  DIAGNOSIS: Metastatic urothelial carcinoma that was initially diagnosed as stage II (T2, N0, M0) in September 2022 status post right nephroureterectomy as well as intravesical treatment and resection of superficial tumor in the bladder several times and March 2023 as well as July 2023.  The patient was found to have enlarging and new pulmonary nodules consistent with metastatic disease in November 2023.    PRIOR THERAPY: 1) Right nephroureterectomy as well as intravesical treatment and resection of superficial tumor in the bladder several times and March 2023 as well as July 2023.  2) Palliative systemic chemotherapy with carboplatin  for AUC of 5 on day 1 and gemcitabine  1000 mg/M2 on days 1 and 8 every 3 weeks. First cycle June 21, 2022. Status post 6 cycles. Her dose of carboplatin  was reduced to AUC of 4 and gemcitabine  to 800 mg/m2 starting from cycle #5 due to cytopenias. 3) Maintenance immunotherapy with Avelumab  800 mg IV every 2 weeks. First dose on 11/15/22. Status post 6 cycle of treatment. 4) Erdafitinib  (balversa ) 8 mg p.o daily. First dose 02/22/23. Status post 4 months of treatment. Discontinued in February 2025 due to disease progression.  5) Enfortumab vedotin  infusion therapy: once a week for three weeks, followed by one week off, repeated every four weeks. First dose was on 08/08/2023.  Status post 1 cycle.  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 First dose 11/05/2023  INTERVAL HISTORY: Morgan Torres 73 y.o. female returns to the clinic today for follow-up visit accompanied by her sister.Discussed the use of  AI scribe software for clinical note transcription with the patient, who gave verbal consent to proceed.  History of Present Illness   Morgan Torres is a 73 year old female with metastatic urothelial carcinoma who presents for evaluation before starting day eight of cycle number one of her current treatment. She is accompanied by her sister, Gregary Lean.  She has a history of metastatic urothelial carcinoma, initially diagnosed as stage II in September 2022, with evidence of metastasis in November 2023. 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 She is currently receiving sacituzumab govitecan  and has received her first dose of the current treatment last week.  She experienced a fall on Mother's Day due to tripping over long pants, resulting in back pain for the past few days. She attended therapy yesterday and visited her primary care provider for blood pressure management, noting that one number was high and one was low.  Her first dose of the current treatment was initially delayed by low blood pressure but was administered the following day. No adverse reactions such as skin rash, diarrhea, nausea, or vomiting, and she feels she handled the treatment well.  No recent weight loss, and she believes she has gained a pound. She is following instructions for dexamethasone , taking two 4 mg tablets daily for three days around her treatment schedule.       MEDICAL HISTORY: Past Medical History:  Diagnosis Date   Anxiety    Arthritis  hip, lumbar spine    Asthma    seasonal allergies    Bronchitis    Hx: of   Chronic kidney disease    COPD (chronic obstructive pulmonary disease) (HCC)    Coronary artery disease    Depression    Fibromyalgia    Hypertension    Lumbar herniated disc    Metastatic  urothelial carcinoma (HCC)    Myocardial infarction (HCC) 06/25/2012   followed by Dr. Maximo Spar, treated medically, no stents   Peripheral arterial disease (HCC)    of right foot   Pneumonia    Hx: of yrs ago   Sciatica    Sleep apnea    Small bowel obstruction (HCC)    several yrs ago    ALLERGIES:  is allergic to diclofenac, padcev  [enfortumab vedotin ], ciprofloxacin, codeine, and hydrocodone.  MEDICATIONS:  Current Outpatient Medications  Medication Sig Dispense Refill   acetaminophen  (TYLENOL ) 500 MG tablet Take 1 tablet (500 mg total) by mouth every 6 (six) hours as needed for mild pain. 30 tablet 0   albuterol  (PROVENTIL  HFA;VENTOLIN  HFA) 108 (90 Base) MCG/ACT inhaler Inhale 2 puffs into the lungs every 6 (six) hours as needed for wheezing or shortness of breath. 1 Inhaler 0   amLODipine  (NORVASC ) 5 MG tablet Take 5 mg by mouth daily. Prescribed by [facility]     aspirin  EC 81 MG tablet Take 81 mg by mouth daily. Swallow whole.     benzonatate  (TESSALON ) 100 MG capsule Take 100 mg by mouth 3 (three) times daily as needed for cough.     calcium  acetate (PHOSLO ) 667 MG tablet Take 667 mg by mouth 3 (three) times daily with meals.     cilostazol  (PLETAL ) 50 MG tablet Take 50 mg by mouth 2 (two) times daily.     clotrimazole  (LOTRIMIN ) 1 % external solution Apply topically 2 (two) times daily. 30 mL 0   dexamethasone  (DECADRON ) 4 MG tablet Take 2 tablets (8 mg) daily for 3 days. Start the day after chemotherapy. (Patient not taking: Reported on 11/05/2023) 30 tablet 1   EPINEPHrine  (EPIPEN  2-PAK) 0.3 mg/0.3 mL DEVI Inject 0.3 mLs (0.3 mg total) into the muscle once as needed (for severe allergic reaction). CAll 911 immediately if you have to use this medicine (Patient taking differently: Inject 0.3 mg into the muscle once as needed (for a severe allergic reaction- CALL 9-1-1 IMMEDIATELY IF USED).) 1 Device 1   gabapentin  (NEURONTIN ) 300 MG capsule Take 300 mg by mouth at bedtime.      guaiFENesin  (MUCINEX ) 600 MG 12 hr tablet Take 2 tablets (1,200 mg total) by mouth 2 (two) times daily. (Patient taking differently: Take 1,200 mg by mouth 2 (two) times daily as needed for to loosen phlegm.) 120 tablet 0   HYDROcodone-acetaminophen  (NORCO) 7.5-325 MG tablet Take 1 tablet by mouth.     HYDROcodone-acetaminophen  (NORCO/VICODIN) 5-325 MG tablet Take 1 tablet by mouth.     ipratropium (ATROVENT ) 0.06 % nasal spray Place 2 sprays into the nose.     Lanolin Alcohol  WAX Apply topically.     levothyroxine  (SYNTHROID ) 88 MCG tablet Take 1 tablet (88 mcg total) by mouth daily before breakfast. 90 tablet 2   lidocaine -prilocaine  (EMLA ) cream Apply 1 Application topically as needed. 30 g 2   loperamide  (IMODIUM ) 2 MG capsule Take 2 tabs by mouth with first loose stool, then 1 tab with each additional loose stool as needed. Do not exceed 8 tabs in a 24-hour period (Patient not  taking: Reported on 11/05/2023) 60 capsule 3   LORazepam  (ATIVAN ) 0.5 MG tablet Take 0.5 mg by mouth daily as needed for anxiety.     magnesium  oxide (MAG-OX) 400 (240 Mg) MG tablet Take 1 tablet (400 mg total) by mouth daily. 30 tablet 1   metoprolol  tartrate (LOPRESSOR ) 25 MG tablet Take 1 tablet by mouth 2 (two) times daily.     MILK OF MAGNESIA 400 MG/5ML suspension Take 15-30 mLs by mouth daily as needed for mild constipation.     montelukast  (SINGULAIR ) 10 MG tablet Take 10 mg by mouth.     naloxone  (NARCAN ) 2 MG/2ML injection as needed for opioid reversal. Spray the contents of 1 device into 1 nostril. Call 911. May repeat with 2nd device in alternate nostril if no response in 2-3 minutes. (Patient not taking: Reported on 11/05/2023)     nicotine  (NICODERM CQ  - DOSED IN MG/24 HOURS) 21 mg/24hr patch Place 21 mg onto the skin daily.     nicotine  polacrilex (NICORETTE ) 4 MG gum Take 4 mg by mouth as needed for smoking cessation.     nitroGLYCERIN  (NITROSTAT ) 0.4 MG SL tablet Place 1 tablet (0.4 mg total) under the  tongue every 5 (five) minutes x 3 doses as needed for chest pain. 25 tablet 2   ondansetron  (ZOFRAN ) 8 MG tablet Take 1 tablet (8 mg total) by mouth every 8 (eight) hours as needed for nausea or vomiting. Start on the third day after chemotherapy. 30 tablet 1   oxyCODONE  (OXY IR/ROXICODONE ) 5 MG immediate release tablet Take 5 mg by mouth 3 (three) times daily as needed.     prochlorperazine  (COMPAZINE ) 10 MG tablet Take 1 tablet (10 mg total) by mouth every 6 (six) hours as needed. 30 tablet 2   sodium chloride  1 g tablet Take 1 tablet (1 g total) by mouth daily. Split 1 gram into two halves and take by mouth once a day 30 tablet 0   temazepam  (RESTORIL ) 15 MG capsule Take 1 capsule (15 mg total) by mouth at bedtime as needed for sleep. 30 capsule 0   traMADol  (ULTRAM ) 50 MG tablet Take 50 mg by mouth every 6 (six) hours as needed (for pain).     No current facility-administered medications for this visit.    SURGICAL HISTORY:  Past Surgical History:  Procedure Laterality Date   ABDOMINAL SURGERY     resection of small intestine   ANTERIOR CRUCIATE LIGAMENT REPAIR Bilateral    BACK SURGERY     fusion of lumbar   CARDIAC CATHETERIZATION  06/25/2012   COLON SURGERY     resection for bowel - for obstruction  6 to 7 yrs ago per pt on 01-05-2022   COLONOSCOPY     Hx; of   DILATION AND CURETTAGE OF UTERUS     EYE SURGERY Bilateral    cataracts removed   HERNIA REPAIR  02/23/1969   inguinal hernia    IR IMAGING GUIDED PORT INSERTION  06/07/2022   LEFT HEART CATHETERIZATION WITH CORONARY ANGIOGRAM N/A 03/30/2013   Procedure: LEFT HEART CATHETERIZATION WITH CORONARY ANGIOGRAM;  Surgeon: Arleen Lacer, MD;  Location: [facility] C[facility] LAB;  Service: Cardiovascular;  Laterality: N/A;   left thumb surgery     yrs ago   ROBOT ASSITED LAPAROSCOPIC NEPHROURETERECTOMY Left 03/09/2021   Procedure: XI ROBOT ASSITED LAPAROSCOPIC NEPHROURETERECTOMY/ CYSTOSCOPY WITH LEFT URETEROSCOPY WITH TRANSURETHRAL  RESECTION OF URETERAL ORIFICE;  Surgeon: Adelbert Homans, MD;  Location: [facility] ORS;  Service: Urology;  Laterality: Left;   SALIVARY GLAND SURGERY Left    approached from inside mouth & side of neck 1970-1980   TONSILLECTOMY     as child   TOTAL HIP ARTHROPLASTY Left 05/28/2014   Procedure: LEFT TOTAL HIP ARTHROPLASTY;  Surgeon: Forbes Ida., MD;  Location: [facility] OR;  Service: Orthopedics;  Laterality: Left;   TRANSURETHRAL RESECTION OF BLADDER TUMOR N/A 08/23/2021   Procedure: TRANSURETHRAL RESECTION OF BLADDER TUMOR (TURBT) WITH CYSTOSCOPY/ POSTOPERATIVE INSTILLATION OF GEMCITABINE /retrograde pyelogram and stent placement (right);  Surgeon: Adelbert Homans, MD;  Location: [facility];  Service: Urology;  Laterality: N/A;   TRANSURETHRAL RESECTION OF BLADDER TUMOR N/A 01/10/2022   Procedure: TRANSURETHRAL RESECTION OF BLADDER TUMOR (TURBT) WITH CYSTOSCOPY / GEMCITABINE  INSTILLATION post-operatively;  Surgeon: Adelbert Homans, MD;  Location: [facility];  Service: Urology;  Laterality: N/A;  ONLY NEEDS 30 MIN    REVIEW OF SYSTEMS:  Constitutional: positive for fatigue Eyes: negative Ears, nose, mouth, throat, and face: negative Respiratory: negative Cardiovascular: negative Gastrointestinal: negative Genitourinary:negative Integument/breast: negative Hematologic/lymphatic: negative Musculoskeletal:negative Neurological: negative Behavioral/Psych: negative Endocrine: negative Allergic/Immunologic: negative   PHYSICAL EXAMINATION: General appearance: alert, cooperative, and no distress Head: Normocephalic, without obvious abnormality, atraumatic Neck: no adenopathy, no carotid bruit, no JVD, supple, symmetrical, trachea midline, and thyroid  not enlarged, symmetric, no tenderness/mass/nodules Lymph nodes: Cervical, supraclavicular, and axillary nodes normal. Resp: clear to auscultation bilaterally Back: symmetric, no curvature. ROM  normal. No CVA tenderness. Cardio: regular rate and rhythm, S1, S2 normal, no murmur, click, rub or gallop GI: soft, non-tender; bowel sounds normal; no masses,  no organomegaly Extremities: extremities normal, atraumatic, no cyanosis or edema Neurologic: Alert and oriented X 3, normal strength and tone. Normal symmetric reflexes. Normal coordination and gait  ECOG PERFORMANCE STATUS: 1 - Symptomatic but completely ambulatory  Blood pressure 110/71, pulse 87, temperature 98.1 F (36.7 C), temperature source Temporal, resp. rate 17, height 5\' 8"  (1.727 m), weight 170 lb 9.6 oz (77.4 kg), SpO2 97%.  LABORATORY DATA: Lab Results  Component Value Date   WBC 3.6 (L) 11/12/2023   HGB 11.8 (L) 11/12/2023   HCT 35.2 (L) 11/12/2023   MCV 97.2 11/12/2023   PLT 266 11/12/2023      Chemistry      Component Value Date/Time   NA 136 11/05/2023 0735   K 4.2 11/05/2023 0735   CL 102 11/05/2023 0735   CO2 28 11/05/2023 0735   BUN 24 (H) 11/05/2023 0735   CREATININE 1.66 (H) 11/05/2023 0735      Component Value Date/Time   CALCIUM  8.8 (L) 11/05/2023 0735   ALKPHOS 69 11/05/2023 0735   AST 15 11/05/2023 0735   ALT 10 11/05/2023 0735   BILITOT 0.3 11/05/2023 0735       RADIOGRAPHIC STUDIES: No results found.  ASSESSMENT AND PLAN: This is a very pleasant 73 years old African-American female with metastatic urothelial carcinoma that was initially diagnosed as stage II in September 2022 but has evidence for disease progression with pulmonary metastasis in November 2023.  She underwent several treatment in the past including systemic chemotherapy with carboplatin  and gemcitabine  followed by maintenance treatment with Avelumab  discontinued secondary to disease progression.  Then the patient underwent treatment with erdafitinib  for 4 months discontinued secondary to disease progression.  Most recently she was treated with enfortumab vedotin  and unfortunately this was discontinued after 1 cycle of  treatment secondary to development of significant skin rash with tissue epidermal necrolysis.  She was seen  for a second opinion by Dr. Zada Herrlich at [facility] and she recommended retesting for HER2 expression which unfortunately came back negative.  It was recommended for the patient to consider treatment with Sacituzumab Govitecan .  She started treatment with sacituzumab govitecan  at dose of 10 Mg/KG on days 1 and 8 every 3 weeks.  First dose was 11/06/2023.  She is here today for evaluation before restarting day 8 of the first cycle of her treatment.    Metastatic urothelial carcinoma Metastatic urothelial carcinoma initially diagnosed as stage II in September 2022 with metastasis evident in November 2023. Currently undergoing treatment with sacituzumab govitecan , post one treatment. No adverse reactions such as skin rash, diarrhea, nausea, or vomiting reported after the first dose. Previous treatments included carboplatin  and gemcitabine , maintenance avelumab , erdafitinib , and enfortumab vedotin , which was discontinued due to tissue epidermal necrolysis. Plan to continue treatment for six rounds (nine weeks) followed by imaging to assess efficacy. If effective, treatment will continue. - Administer sacituzumab govitecan  on day 8 of cycle 1 - Instruct to take dexamethasone  4 mg, two tablets daily for three days after treatment - Schedule imaging after six rounds of treatment to assess efficacy  Back pain Recent onset of back pain following a fall on Mother's Day. Pain has persisted for the last couple of days. Underwent therapy recently.  Hypertension Hypertension under evaluation by primary care. Recent visit to primary care for blood pressure management. Blood pressure readings show one high and one low number.   The patient was advised to call immediately if she has any other concerning symptoms in the interval. The patient voices understanding of current disease status and treatment options  and is in agreement with the current care plan.  All questions were answered. The patient knows to call the clinic with any problems, questions or concerns. We can certainly see the patient much sooner if necessary.  The total time spent in the appointment was 30 minutes including review of chart and various tests results, discussions about plan of care and coordination of care plan .   Disclaimer: This note was dictated with voice recognition software. Similar sounding words can inadvertently be transcribed and may not be corrected upon review.

## 2023-11-12 NOTE — Patient Instructions (Signed)
 [facility] - A DEPT OF [place]  Discharge Instructions: Thank you for choosing [facility] to provide your oncology and hematology care.   If you have a lab appointment with the Cancer Center, please go directly to the Cancer Center and check in at the registration area.   Wear comfortable clothing and clothing appropriate for easy access to any Portacath or PICC line.   We strive to give you quality time with your provider. You may need to reschedule your appointment if you arrive late (15 or more minutes).  Arriving late affects you and other patients whose appointments are after yours.  Also, if you miss three or more appointments without notifying the office, you may be dismissed from the clinic at the provider's discretion.      For prescription refill requests, have your pharmacy contact our office and allow 72 hours for refills to be completed.    Today you received the following chemotherapy and/or immunotherapy agents: sacituzumab govitecan -hziy      To help prevent nausea and vomiting after your treatment, we encourage you to take your nausea medication as directed.  BELOW ARE SYMPTOMS THAT SHOULD BE REPORTED IMMEDIATELY: *FEVER GREATER THAN 100.4 F (38 C) OR HIGHER *CHILLS OR SWEATING *NAUSEA AND VOMITING THAT IS NOT CONTROLLED WITH YOUR NAUSEA MEDICATION *UNUSUAL SHORTNESS OF BREATH *UNUSUAL BRUISING OR BLEEDING *URINARY PROBLEMS (pain or burning when urinating, or frequent urination) *BOWEL PROBLEMS (unusual diarrhea, constipation, pain near the anus) TENDERNESS IN MOUTH AND THROAT WITH OR WITHOUT PRESENCE OF ULCERS (sore throat, sores in mouth, or a toothache) UNUSUAL RASH, SWELLING OR PAIN  UNUSUAL VAGINAL DISCHARGE OR ITCHING   Items with * indicate a potential emergency and should be followed up as soon as possible or go to the Emergency Department if any problems should occur.  Please show the CHEMOTHERAPY ALERT CARD  or IMMUNOTHERAPY ALERT CARD at check-in to the Emergency Department and triage nurse.  Should you have questions after your visit or need to cancel or reschedule your appointment, please contact [facility] - A DEPT OF [facility]  Dept: [phone]  and follow the prompts.  Office hours are 8:00 a.m. to 4:30 p.m. Monday - Friday. Please note that voicemails left after 4:00 p.m. may not be returned until the following business day.  We are closed weekends and major holidays. You have access to a nurse at all times for urgent questions. Please call the main number to the clinic Dept: [phone] and follow the prompts.   For any non-urgent questions, you may also contact your provider using MyChart. We now offer e-Visits for anyone 67 and older to request care online for non-urgent symptoms. For details visit [email or web address].   Also download the MyChart app! Go to the app store, search "MyChart", open the app, select [place], and log in with your MyChart username and password.

## 2023-11-13 ENCOUNTER — Ambulatory Visit: Admitting: Physical Therapy

## 2023-11-13 ENCOUNTER — Ambulatory Visit: Payer: Self-pay | Admitting: *Deleted

## 2023-11-13 LAB — T4: T4, Total: 7.4 ug/dL (ref 4.5–12.0)

## 2023-11-13 NOTE — Telephone Encounter (Signed)
-----   Message from Aurelio Blower sent at 11/12/2023  8:30 PM EDT ----- Please let the patient knows that I adjusted her levothyroxine  dose to 100 mcg because of the elevated TSH.  I did send new prescription to her pharmacy. ----- Message ----- From: Heilingoetter, Leita Purdue, PA-C Sent: 11/12/2023   3:17 PM EDT To: Marlene Simas, MD   ----- Message ----- From: Interface, Lab [facility] [place] Sent: 11/12/2023   1:48 PM EDT To: Leita Purdue Heilingoetter, PA-C

## 2023-11-13 NOTE — Telephone Encounter (Signed)
 Called pt & informed of new script/change in Thyroid  med per Dr [name].  She asked what to do with current dose.  Informed to hold on to this in case she needs later.  She expressed understanding.

## 2023-11-14 ENCOUNTER — Ambulatory Visit: Payer: Medicare Other

## 2023-11-14 ENCOUNTER — Other Ambulatory Visit: Payer: Medicare Other

## 2023-11-19 ENCOUNTER — Telehealth: Payer: Self-pay

## 2023-11-19 NOTE — Telephone Encounter (Signed)
 Message was left  at 1030 am for patient to call us  back regarding an after hours nurse call message from 11/18/2023. After hours message stated that she has had diarrhea for 3 days and plans to visit the ED. She also wanted to know about her upcoming appointments. We will try to call her again and she has not been to the ED yet. Tried to call her again at 1530. No answer but left another message.

## 2023-11-20 ENCOUNTER — Ambulatory Visit: Admitting: Physical Therapy

## 2023-11-21 ENCOUNTER — Ambulatory Visit: Payer: Medicare Other | Admitting: Internal Medicine

## 2023-11-21 ENCOUNTER — Other Ambulatory Visit: Payer: Medicare Other

## 2023-11-21 ENCOUNTER — Ambulatory Visit: Payer: Medicare Other

## 2023-11-22 ENCOUNTER — Other Ambulatory Visit: Payer: Self-pay | Admitting: Physician Assistant

## 2023-11-22 ENCOUNTER — Inpatient Hospital Stay

## 2023-11-22 ENCOUNTER — Inpatient Hospital Stay: Admitting: Physician Assistant

## 2023-11-22 ENCOUNTER — Telehealth: Payer: Self-pay

## 2023-11-22 VITALS — BP 138/77 | HR 86 | Temp 98.0°F | Resp 16 | Ht 68.0 in | Wt 166.2 lb

## 2023-11-22 VITALS — BP 117/51 | HR 73 | Temp 98.0°F | Resp 18 | Wt 166.2 lb

## 2023-11-22 DIAGNOSIS — I252 Old myocardial infarction: Secondary | ICD-10-CM | POA: Diagnosis not present

## 2023-11-22 DIAGNOSIS — N1831 Chronic kidney disease, stage 3a: Secondary | ICD-10-CM | POA: Diagnosis not present

## 2023-11-22 DIAGNOSIS — J439 Emphysema, unspecified: Secondary | ICD-10-CM | POA: Diagnosis not present

## 2023-11-22 DIAGNOSIS — C679 Malignant neoplasm of bladder, unspecified: Secondary | ICD-10-CM

## 2023-11-22 DIAGNOSIS — E860 Dehydration: Secondary | ICD-10-CM

## 2023-11-22 DIAGNOSIS — Z9089 Acquired absence of other organs: Secondary | ICD-10-CM | POA: Diagnosis not present

## 2023-11-22 DIAGNOSIS — Z881 Allergy status to other antibiotic agents status: Secondary | ICD-10-CM | POA: Diagnosis not present

## 2023-11-22 DIAGNOSIS — C7910 Secondary malignant neoplasm of unspecified urinary organs: Secondary | ICD-10-CM

## 2023-11-22 DIAGNOSIS — I2510 Atherosclerotic heart disease of native coronary artery without angina pectoris: Secondary | ICD-10-CM | POA: Diagnosis not present

## 2023-11-22 DIAGNOSIS — R110 Nausea: Secondary | ICD-10-CM | POA: Diagnosis not present

## 2023-11-22 DIAGNOSIS — R5383 Other fatigue: Secondary | ICD-10-CM | POA: Diagnosis not present

## 2023-11-22 DIAGNOSIS — I700 Atherosclerosis of aorta: Secondary | ICD-10-CM | POA: Diagnosis not present

## 2023-11-22 DIAGNOSIS — E038 Other specified hypothyroidism: Secondary | ICD-10-CM | POA: Diagnosis not present

## 2023-11-22 DIAGNOSIS — R197 Diarrhea, unspecified: Secondary | ICD-10-CM

## 2023-11-22 DIAGNOSIS — Z79899 Other long term (current) drug therapy: Secondary | ICD-10-CM | POA: Diagnosis not present

## 2023-11-22 DIAGNOSIS — Z5112 Encounter for antineoplastic immunotherapy: Secondary | ICD-10-CM | POA: Diagnosis not present

## 2023-11-22 DIAGNOSIS — M549 Dorsalgia, unspecified: Secondary | ICD-10-CM | POA: Diagnosis not present

## 2023-11-22 DIAGNOSIS — I129 Hypertensive chronic kidney disease with stage 1 through stage 4 chronic kidney disease, or unspecified chronic kidney disease: Secondary | ICD-10-CM | POA: Diagnosis not present

## 2023-11-22 DIAGNOSIS — Z7902 Long term (current) use of antithrombotics/antiplatelets: Secondary | ICD-10-CM | POA: Diagnosis not present

## 2023-11-22 DIAGNOSIS — Z885 Allergy status to narcotic agent status: Secondary | ICD-10-CM | POA: Diagnosis not present

## 2023-11-22 DIAGNOSIS — T465X6A Underdosing of other antihypertensive drugs, initial encounter: Secondary | ICD-10-CM | POA: Diagnosis not present

## 2023-11-22 DIAGNOSIS — Z7982 Long term (current) use of aspirin: Secondary | ICD-10-CM | POA: Diagnosis not present

## 2023-11-22 DIAGNOSIS — Z95828 Presence of other vascular implants and grafts: Secondary | ICD-10-CM

## 2023-11-22 DIAGNOSIS — C771 Secondary and unspecified malignant neoplasm of intrathoracic lymph nodes: Secondary | ICD-10-CM | POA: Diagnosis not present

## 2023-11-22 DIAGNOSIS — C7800 Secondary malignant neoplasm of unspecified lung: Secondary | ICD-10-CM | POA: Diagnosis not present

## 2023-11-22 DIAGNOSIS — L513 Stevens-Johnson syndrome-toxic epidermal necrolysis overlap syndrome: Secondary | ICD-10-CM | POA: Diagnosis not present

## 2023-11-22 DIAGNOSIS — F17210 Nicotine dependence, cigarettes, uncomplicated: Secondary | ICD-10-CM | POA: Diagnosis not present

## 2023-11-22 DIAGNOSIS — G8929 Other chronic pain: Secondary | ICD-10-CM | POA: Diagnosis not present

## 2023-11-22 LAB — CBC WITH DIFFERENTIAL (CANCER CENTER ONLY)
Abs Immature Granulocytes: 0.09 10*3/uL — ABNORMAL HIGH (ref 0.00–0.07)
Basophils Absolute: 0.1 10*3/uL (ref 0.0–0.1)
Basophils Relative: 2 %
Eosinophils Absolute: 0 10*3/uL (ref 0.0–0.5)
Eosinophils Relative: 1 %
HCT: 33.8 % — ABNORMAL LOW (ref 36.0–46.0)
Hemoglobin: 11.7 g/dL — ABNORMAL LOW (ref 12.0–15.0)
Immature Granulocytes: 2 %
Lymphocytes Relative: 47 %
Lymphs Abs: 2.5 10*3/uL (ref 0.7–4.0)
MCH: 32.5 pg (ref 26.0–34.0)
MCHC: 34.6 g/dL (ref 30.0–36.0)
MCV: 93.9 fL (ref 80.0–100.0)
Monocytes Absolute: 0.8 10*3/uL (ref 0.1–1.0)
Monocytes Relative: 15 %
Neutro Abs: 1.8 10*3/uL (ref 1.7–7.7)
Neutrophils Relative %: 33 %
Platelet Count: 284 10*3/uL (ref 150–400)
RBC: 3.6 MIL/uL — ABNORMAL LOW (ref 3.87–5.11)
RDW: 13.3 % (ref 11.5–15.5)
Smear Review: NORMAL
WBC Count: 5.3 10*3/uL (ref 4.0–10.5)
nRBC: 0 % (ref 0.0–0.2)

## 2023-11-22 LAB — CMP (CANCER CENTER ONLY)
ALT: 11 U/L (ref 0–44)
AST: 11 U/L — ABNORMAL LOW (ref 15–41)
Albumin: 3.7 g/dL (ref 3.5–5.0)
Alkaline Phosphatase: 80 U/L (ref 38–126)
Anion gap: 9 (ref 5–15)
BUN: 34 mg/dL — ABNORMAL HIGH (ref 8–23)
CO2: 23 mmol/L (ref 22–32)
Calcium: 8.9 mg/dL (ref 8.9–10.3)
Chloride: 97 mmol/L — ABNORMAL LOW (ref 98–111)
Creatinine: 2.19 mg/dL — ABNORMAL HIGH (ref 0.44–1.00)
GFR, Estimated: 23 mL/min — ABNORMAL LOW (ref >60)
Glucose, Bld: 104 mg/dL — ABNORMAL HIGH (ref 70–99)
Potassium: 3.9 mmol/L (ref 3.5–5.1)
Sodium: 129 mmol/L — ABNORMAL LOW (ref 135–145)
Total Bilirubin: 0.3 mg/dL (ref 0.0–1.2)
Total Protein: 7.1 g/dL (ref 6.5–8.1)

## 2023-11-22 LAB — MAGNESIUM: Magnesium: 2.1 mg/dL (ref 1.7–2.4)

## 2023-11-22 MED ORDER — SODIUM CHLORIDE 0.9 % IV SOLN: INTRAVENOUS | Status: DC

## 2023-11-22 MED ORDER — HEPARIN SOD (PORK) LOCK FLUSH 100 UNIT/ML IV SOLN
500.0000 [IU] | Freq: Once | INTRAVENOUS | Status: AC
  Administered 2023-11-22: 500 [IU]

## 2023-11-22 MED ORDER — SODIUM CHLORIDE 0.9% FLUSH
10.0000 mL | Freq: Once | INTRAVENOUS | Status: AC
  Administered 2023-11-22: 10 mL

## 2023-11-22 MED ORDER — ONDANSETRON HCL 4 MG/2ML IJ SOLN
4.0000 mg | Freq: Once | INTRAMUSCULAR | Status: AC
  Administered 2023-11-22: 4 mg via INTRAVENOUS
  Filled 2023-11-22: qty 2

## 2023-11-22 NOTE — Progress Notes (Signed)
 Per Rosalind Columbia, PA-C - okay to run IVF over 1 hr.

## 2023-11-22 NOTE — Patient Instructions (Signed)
 Diarrhea, Adult Diarrhea is frequent loose and sometimes watery bowel movements. Diarrhea can make you feel weak and cause you to become dehydrated. Dehydration is a condition in which there is not enough water or other fluids in the body. Dehydration can make you tired and thirsty, cause you to have a dry mouth, and decrease how often you urinate. Diarrhea typically lasts 2-3 days. However, it can last longer if it is a sign of something more serious. It is important to treat your diarrhea as told by your health care provider. Follow these instructions at home: Eating and drinking     Follow these recommendations as told by your health care provider: Take an oral rehydration solution (ORS). This is an over-the-counter medicine that helps return your body to its normal balance of nutrients and water. It is found at pharmacies and retail stores. Drink enough fluid to keep your urine pale yellow. Drink fluids such as water, diluted fruit juice, and low-calorie sports drinks. You can drink milk also, if desired. Sucking on ice chips is another way to get fluids. Avoid drinking fluids that contain a lot of sugar or caffeine, such as soda, energy drinks, and regular sports drinks. Avoid alcohol. Eat bland, easy-to-digest foods in small amounts as you are able. These foods include bananas, applesauce, rice, lean meats, toast, and crackers. Avoid spicy or fatty foods.  Medicines Take over-the-counter and prescription medicines only as told by your health care provider. If you were prescribed antibiotics, take them as told by your health care provider. Do not stop using the antibiotic even if you start to feel better. General instructions  Wash your hands often using soap and water for at least 20 seconds. If soap and water are not available, use hand sanitizer. Others in the household should wash their hands as well. Hands should be washed: After using the toilet or changing a diaper. Before  preparing, cooking, or serving food. While caring for a sick person or while visiting someone in a hospital. Rest at home while you recover. Take a warm bath to relieve any burning or pain from frequent diarrhea episodes. Watch your condition for any changes. Contact a health care provider if: You have a fever. Your diarrhea gets worse. You have new symptoms. You vomit every time you eat or drink. You feel light-headed, dizzy, or have a headache. You have muscle cramps. You have signs of dehydration, such as: Dark urine, very little urine, or no urine. Cracked lips. Dry mouth. Sunken eyes. Sleepiness. Weakness. You have bloody or black stools or stools that look like tar. You have severe pain, cramping, or bloating in your abdomen. Your skin feels cold and clammy. You feel confused. Get help right away if: You have chest pain or your heart is beating very quickly. You have trouble breathing or you are breathing very quickly. You feel extremely weak or you faint. These symptoms may be an emergency. Get help right away. Call 911. Do not wait to see if the symptoms will go away. Do not drive yourself to the hospital. This information is not intended to replace advice given to you by your health care provider. Make sure you discuss any questions you have with your health care provider. Document Revised: 11/28/2021 Document Reviewed: 11/28/2021 Elsevier Patient Education  2024 [employer].

## 2023-11-22 NOTE — Progress Notes (Signed)
 Symptom Management Consult Note [facility]    Patient Care Team: Wilburn Handler, MD as PCP - General (Family Medicine) Maximo Spar Aviva Lemmings, MD as PCP - Cardiology (Cardiology) Marlene Simas, MD as Consulting Physician (Oncology)    Name / MRN / DOB: Morgan Torres  [id number]  [birth year]   Date of visit: 11/22/2023   Chief Complaint/Reason for visit: diarrhea   Current Therapy: sacituzumab govitecan  (Trodelvy ) 10 mg/kg on days 1 and 8 every 3 weeks   Last treatment:  Day 8   Cycle 1 on 05/200/25    ASSESSMENT AND PLAN Patient is a 73 y.o. female with oncologic history of metastatic urothelial carcinoma followed by Dr. [name].  I have viewed most recent oncology note and lab work.  #Metastatic urothelial carcinoma  - Next appointment with oncologist is 11/26/23   #Diarrhea - AE of treatment. Grade 1 per HPI - Labs today show hyponatremia 129 and elevated kidney function BUN/Cr 34/2.19 which is up from 26/1.49 x 10 days ago.  - Patient received 1L NS in clinic and IV zofran .  - Engaged in shared decision making with patient. She prefer to try to increase fluid intake over the weekend and come in Monday for repeat labs in efforts to hold off on admission at this time. This is a reasonable plan as her diarrhea frequency is improving.  - Patient will continue to take salt tablets as prescribed and  take imodium  as directed.   Strict ED precautions discussed should symptoms worsen.   HEME/ONC HISTORY Oncology History  Metastatic urothelial carcinoma (HCC)  05/30/2022 Initial Diagnosis   Metastatic urothelial carcinoma (HCC)   06/21/2022 - 10/20/2022 Chemotherapy   Patient is on Treatment Plan : BLADDER Carboplatin  D1 + Gemcitabine  D1,8 q21d     11/14/2022 - 01/24/2023 Chemotherapy   Patient is on Treatment Plan : BLADDER Avelumab  q14d     08/16/2023 - 08/29/2023 Chemotherapy   Patient is on Treatment Plan : UROTHELIAL LOCALLY ADVANCED, METASTATIC Enfortumab  D1,8,15 q28d     11/06/2023 -  Chemotherapy   Patient is on Treatment Plan : bladder Sacituzumab govitecan -hziy (Trodelvy ) D1,8 q21d     Bladder cancer metastasized to lung (HCC)  05/30/2022 Initial Diagnosis   Bladder cancer metastasized to lung (HCC)   05/30/2022 Cancer Staging   Staging form: Lung, AJCC 8th Edition - Clinical: Stage IVA (cT2a, cN0, cM1a) - Signed by Marlene Simas, MD on 05/30/2022   11/06/2023 -  Chemotherapy   Patient is on Treatment Plan : bladder Sacituzumab govitecan -hziy (Trodelvy ) D1,8 q21d         INTERVAL HISTORY  Discussed the use of AI scribe software for clinical note transcription with the patient, who gave verbal consent to proceed.    Morgan Torres is a 73 y.o. female with oncologic history as above presenting to [facility] today with chief complaint of diarrhea. Patient is accompanied by family member who provides additional history.  She has been experiencing diarrhea x 1 week. Initially, the diarrhea was frequent, but it has since decreased to two or three times a day. The stool is described as yellow and green in color. She has been using Imodium , taking it once today, and has some at home. She denies any sick contacts, recent travel or antibiotic use. Also reporting intermittent nausea that is controlled with antiemetics. She took nausea medication earlier in the morning, around 8 to 9 AM, and believes it was Compazine . She is currently feeling 'just kind of queasy'  but not severely nauseous. No fevers or abdominal pain have been experienced. Her appetite is reduced, and she has been eating minimally. She has consumed some chicken and broth recently and has been drinking Pedialyte and water .     ROS  All other systems are reviewed and are negative for acute change except as noted in the HPI.    Allergies  Allergen Reactions   Diclofenac Anaphylaxis   Padcev  [Enfortumab Vedotin ] Other (See Comments)    Rogue Clear Syndrome   Ciprofloxacin Rash    Codeine Itching   Hydrocodone Itching and Other (See Comments)    Skin broke out while taking Norco 7.5/325     Past Medical History:  Diagnosis Date   Anxiety    Arthritis    hip, lumbar spine    Asthma    seasonal allergies    Bronchitis    Hx: of   Chronic kidney disease    COPD (chronic obstructive pulmonary disease) (HCC)    Coronary artery disease    Depression    Fibromyalgia    Hypertension    Lumbar herniated disc    Metastatic urothelial carcinoma (HCC)    Myocardial infarction (HCC) 06/25/2012   followed by Dr. Maximo Spar, treated medically, no stents   Peripheral arterial disease (HCC)    of right foot   Pneumonia    Hx: of yrs ago   Sciatica    Sleep apnea    Small bowel obstruction (HCC)    several yrs ago     Past Surgical History:  Procedure Laterality Date   ABDOMINAL SURGERY     resection of small intestine   ANTERIOR CRUCIATE LIGAMENT REPAIR Bilateral    BACK SURGERY     fusion of lumbar   CARDIAC CATHETERIZATION  06/25/2012   COLON SURGERY     resection for bowel - for obstruction  6 to 7 yrs ago per pt on 01-05-2022   COLONOSCOPY     Hx; of   DILATION AND CURETTAGE OF UTERUS     EYE SURGERY Bilateral    cataracts removed   HERNIA REPAIR  02/23/1969   inguinal hernia    IR IMAGING GUIDED PORT INSERTION  06/07/2022   LEFT HEART CATHETERIZATION WITH CORONARY ANGIOGRAM N/A 03/30/2013   Procedure: LEFT HEART CATHETERIZATION WITH CORONARY ANGIOGRAM;  Surgeon: Arleen Lacer, MD;  Location: [facility] CATH LAB;  Service: Cardiovascular;  Laterality: N/A;   left thumb surgery     yrs ago   ROBOT ASSITED LAPAROSCOPIC NEPHROURETERECTOMY Left 03/09/2021   Procedure: XI ROBOT ASSITED LAPAROSCOPIC NEPHROURETERECTOMY/ CYSTOSCOPY WITH LEFT URETEROSCOPY WITH TRANSURETHRAL RESECTION OF URETERAL ORIFICE;  Surgeon: Adelbert Homans, MD;  Location: [facility] ORS;  Service: Urology;  Laterality: Left;   SALIVARY GLAND SURGERY Left    approached from inside mouth &  side of neck 1970-1980   TONSILLECTOMY     as child   TOTAL HIP ARTHROPLASTY Left 05/28/2014   Procedure: LEFT TOTAL HIP ARTHROPLASTY;  Surgeon: Forbes Ida., MD;  Location: [facility] OR;  Service: Orthopedics;  Laterality: Left;   TRANSURETHRAL RESECTION OF BLADDER TUMOR N/A 08/23/2021   Procedure: TRANSURETHRAL RESECTION OF BLADDER TUMOR (TURBT) WITH CYSTOSCOPY/ POSTOPERATIVE INSTILLATION OF GEMCITABINE /retrograde pyelogram and stent placement (right);  Surgeon: Adelbert Homans, MD;  Location: [facility];  Service: Urology;  Laterality: N/A;   TRANSURETHRAL RESECTION OF BLADDER TUMOR N/A 01/10/2022   Procedure: TRANSURETHRAL RESECTION OF BLADDER TUMOR (TURBT) WITH CYSTOSCOPY / GEMCITABINE  INSTILLATION post-operatively;  Surgeon: Winter,  Rhodia Cera, MD;  Location: [facility];  Service: Urology;  Laterality: N/A;  ONLY NEEDS 30 MIN    Social History   Socioeconomic History   Marital status: Married    Spouse name: Not on file   Number of children: Not on file   Years of education: Not on file   Highest education level: Not on file  Occupational History   Not on file  Tobacco Use   Smoking status: Some Days    Current packs/day: 0.50    Average packs/day: 0.5 packs/day for 25.0 years (12.5 ttl pk-yrs)    Types: Cigarettes   Smokeless tobacco: Never   Tobacco comments:    Currently on the Nicoderm patch."smokes a few"  Vaping Use   Vaping status: Never Used  Substance and Sexual Activity   Alcohol  use: Yes    Comment: occasionally   Drug use: No   Sexual activity: Not Currently  Other Topics Concern   Not on file  Social History Narrative   Not on file   Social Drivers of Health   Financial Resource Strain: Not on file  Food Insecurity: Low Risk  (09/12/2023)   Received from [facility]   Hunger Vital Sign    Worried About Running Out of Food in the Last Year: Never true    Ran Out of Food in the Last Year: Never true   Transportation Needs: No Transportation Needs (09/24/2023)   Received from [facility]   PRAPARE - Transportation    Lack of Transportation (Medical): No    Lack of Transportation (Non-Medical): No  Physical Activity: Not on file  Stress: Not on file  Social Connections: Socially Integrated (09/02/2023)   Social Connection and Isolation Panel [NHANES]    Frequency of Communication with Friends and Family: Three times a week    Frequency of Social Gatherings with Friends and Family: Once a week    Attends Religious Services: More than 4 times per year    Active Member of [employer] or Organizations: Yes    Attends Engineer, structural: More than 4 times per year    Marital Status: Married  Catering manager Violence: Not At Risk (09/02/2023)   Humiliation, Afraid, Rape, and Kick questionnaire    Fear of Current or Ex-Partner: No    Emotionally Abused: No    Physically Abused: No    Sexually Abused: No    Family History  Problem Relation Age of Onset   Hypertension Mother    Diabetes Mother    Cancer - Other Mother    Cancer Sister    Breast cancer Daughter      Current Outpatient Medications:    acetaminophen  (TYLENOL ) 500 MG tablet, Take 1 tablet (500 mg total) by mouth every 6 (six) hours as needed for mild pain., Disp: 30 tablet, Rfl: 0   albuterol  (PROVENTIL  HFA;VENTOLIN  HFA) 108 (90 Base) MCG/ACT inhaler, Inhale 2 puffs into the lungs every 6 (six) hours as needed for wheezing or shortness of breath., Disp: 1 Inhaler, Rfl: 0   amLODipine  (NORVASC ) 5 MG tablet, Take 5 mg by mouth daily. Prescribed by [facility], Disp: , Rfl:    aspirin  EC 81 MG tablet, Take 81 mg by mouth daily. Swallow whole., Disp: , Rfl:    benzonatate  (TESSALON ) 100 MG capsule, Take 100 mg by mouth 3 (three) times daily as needed for cough., Disp: , Rfl:    calcium  acetate (PHOSLO ) 667 MG tablet, Take 667 mg by mouth 3 (three)  times daily with meals., Disp: , Rfl:    cilostazol  (PLETAL ) 50 MG tablet,  Take 50 mg by mouth 2 (two) times daily., Disp: , Rfl:    clotrimazole  (LOTRIMIN ) 1 % external solution, Apply topically 2 (two) times daily., Disp: 30 mL, Rfl: 0   dexamethasone  (DECADRON ) 4 MG tablet, Take 2 tablets (8 mg) daily for 3 days. Start the day after chemotherapy. (Patient not taking: Reported on 11/05/2023), Disp: 30 tablet, Rfl: 1   EPINEPHrine  (EPIPEN  2-PAK) 0.3 mg/0.3 mL DEVI, Inject 0.3 mLs (0.3 mg total) into the muscle once as needed (for severe allergic reaction). CAll 911 immediately if you have to use this medicine (Patient taking differently: Inject 0.3 mg into the muscle once as needed (for a severe allergic reaction- CALL 9-1-1 IMMEDIATELY IF USED).), Disp: 1 Device, Rfl: 1   gabapentin  (NEURONTIN ) 300 MG capsule, Take 300 mg by mouth at bedtime., Disp: , Rfl:    guaiFENesin  (MUCINEX ) 600 MG 12 hr tablet, Take 2 tablets (1,200 mg total) by mouth 2 (two) times daily. (Patient taking differently: Take 1,200 mg by mouth 2 (two) times daily as needed for to loosen phlegm.), Disp: 120 tablet, Rfl: 0   HYDROcodone-acetaminophen  (NORCO) 7.5-325 MG tablet, Take 1 tablet by mouth., Disp: , Rfl:    HYDROcodone-acetaminophen  (NORCO/VICODIN) 5-325 MG tablet, Take 1 tablet by mouth., Disp: , Rfl:    ipratropium (ATROVENT ) 0.06 % nasal spray, Place 2 sprays into the nose., Disp: , Rfl:    Lanolin Alcohol  WAX, Apply topically., Disp: , Rfl:    levothyroxine  (SYNTHROID ) 100 MCG tablet, Take 1 tablet (100 mcg total) by mouth daily before breakfast., Disp: 30 tablet, Rfl: 1   lidocaine -prilocaine  (EMLA ) cream, Apply 1 Application topically as needed., Disp: 30 g, Rfl: 2   loperamide  (IMODIUM ) 2 MG capsule, Take 2 tabs by mouth with first loose stool, then 1 tab with each additional loose stool as needed. Do not exceed 8 tabs in a 24-hour period (Patient not taking: Reported on 11/05/2023), Disp: 60 capsule, Rfl: 3   LORazepam  (ATIVAN ) 0.5 MG tablet, Take 0.5 mg by mouth daily as needed for  anxiety., Disp: , Rfl:    magnesium  oxide (MAG-OX) 400 (240 Mg) MG tablet, Take 1 tablet (400 mg total) by mouth daily., Disp: 30 tablet, Rfl: 1   metoprolol  tartrate (LOPRESSOR ) 25 MG tablet, Take 1 tablet by mouth 2 (two) times daily., Disp: , Rfl:    MILK OF MAGNESIA 400 MG/5ML suspension, Take 15-30 mLs by mouth daily as needed for mild constipation., Disp: , Rfl:    montelukast  (SINGULAIR ) 10 MG tablet, Take 10 mg by mouth., Disp: , Rfl:    naloxone  (NARCAN ) 2 MG/2ML injection, as needed for opioid reversal. Spray the contents of 1 device into 1 nostril. Call 911. May repeat with 2nd device in alternate nostril if no response in 2-3 minutes. (Patient not taking: Reported on 11/05/2023), Disp: , Rfl:    nicotine  (NICODERM CQ  - DOSED IN MG/24 HOURS) 21 mg/24hr patch, Place 21 mg onto the skin daily., Disp: , Rfl:    nicotine  polacrilex (NICORETTE ) 4 MG gum, Take 4 mg by mouth as needed for smoking cessation., Disp: , Rfl:    nitroGLYCERIN  (NITROSTAT ) 0.4 MG SL tablet, Place 1 tablet (0.4 mg total) under the tongue every 5 (five) minutes x 3 doses as needed for chest pain., Disp: 25 tablet, Rfl: 2   ondansetron  (ZOFRAN ) 8 MG tablet, Take 1 tablet (8 mg total) by mouth every 8 (  eight) hours as needed for nausea or vomiting. Start on the third day after chemotherapy., Disp: 30 tablet, Rfl: 1   oxyCODONE  (OXY IR/ROXICODONE ) 5 MG immediate release tablet, Take 5 mg by mouth 3 (three) times daily as needed., Disp: , Rfl:    prochlorperazine  (COMPAZINE ) 10 MG tablet, Take 1 tablet (10 mg total) by mouth every 6 (six) hours as needed., Disp: 30 tablet, Rfl: 2   sodium chloride  1 g tablet, Take 1 tablet (1 g total) by mouth daily. Split 1 gram into two halves and take by mouth once a day, Disp: 30 tablet, Rfl: 0   temazepam  (RESTORIL ) 15 MG capsule, Take 1 capsule (15 mg total) by mouth at bedtime as needed for sleep., Disp: 30 capsule, Rfl: 0   traMADol  (ULTRAM ) 50 MG tablet, Take 50 mg by mouth every 6  (six) hours as needed (for pain)., Disp: , Rfl:  No current facility-administered medications for this visit.  Facility-Administered Medications Ordered in Other Visits:    0.9 %  sodium chloride  infusion, , Intravenous, Continuous, Heilingoetter, Cassandra L, PA-C, Stopped at 11/22/23 1558  PHYSICAL EXAM ECOG FS:1 - Symptomatic but completely ambulatory    Vitals:   11/22/23 1459  BP: 138/77  Pulse: 86  Resp: 16  Temp: 98 F (36.7 C)  TempSrc: Temporal  SpO2: 96%  Weight: 166 lb 3.2 oz (75.4 kg)  Height: 5\' 8"  (1.727 m)   Physical Exam Vitals and nursing note reviewed.  Constitutional:      Appearance: She is not ill-appearing or toxic-appearing.  HENT:     Head: Normocephalic.  Eyes:     Conjunctiva/sclera: Conjunctivae normal.  Cardiovascular:     Rate and Rhythm: Normal rate and regular rhythm.     Pulses: Normal pulses.     Heart sounds: Normal heart sounds.  Pulmonary:     Effort: Pulmonary effort is normal.     Breath sounds: Normal breath sounds.  Abdominal:     General: There is no distension.     Tenderness: There is no abdominal tenderness.  Musculoskeletal:     Cervical back: Normal range of motion.  Skin:    General: Skin is warm and dry.  Neurological:     Mental Status: She is alert.        LABORATORY DATA I have reviewed the data as listed    Latest Ref Rng & Units 11/22/2023    2:36 PM 11/12/2023    9:24 AM 11/05/2023    7:35 AM  CBC  WBC 4.0 - 10.5 K/uL 5.3  3.6  5.5   Hemoglobin 12.0 - 15.0 g/dL 19.1  47.8  29.5   Hematocrit 36.0 - 46.0 % 33.8  35.2  33.8   Platelets 150 - 400 K/uL 284  266  268         Latest Ref Rng & Units 11/22/2023    2:36 PM 11/12/2023    9:24 AM 11/05/2023    7:35 AM  CMP  Glucose 70 - 99 mg/dL 621  308  98   BUN 8 - 23 mg/dL 34  26  24   Creatinine 0.44 - 1.00 mg/dL 6.57  8.46  9.62   Sodium 135 - 145 mmol/L 129  137  136   Potassium 3.5 - 5.1 mmol/L 3.9  4.3  4.2   Chloride 98 - 111 mmol/L 97  101  102    CO2 22 - 32 mmol/L 23  30  28    Calcium  8.9 -  10.3 mg/dL 8.9  9.1  8.8   Total Protein 6.5 - 8.1 g/dL 7.1  6.5  6.2   Total Bilirubin 0.0 - 1.2 mg/dL 0.3  0.2  0.3   Alkaline Phos 38 - 126 U/L 80  79  69   AST 15 - 41 U/L 11  13  15    ALT 0 - 44 U/L 11  10  10         RADIOGRAPHIC STUDIES (from last 24 hours if applicable) I have personally reviewed the radiological images as listed and agreed with the findings in the report. No results found.      Visit Diagnosis: 1. Nausea without vomiting   2. Diarrhea, unspecified type   3. Dehydration   4. Metastatic urothelial carcinoma (HCC)      Orders Placed This Encounter  Procedures   CMP (Cancer Center only)    Standing Status:   Future    Expected Date:   11/25/2023    Expiration Date:   11/21/2024    All questions were answered. The patient knows to call the clinic with any problems, questions or concerns. No barriers to learning was detected.  A total of more than 30 minutes were spent on this encounter with face-to-face time and non-face-to-face time, including preparing to see the patient, ordering tests and/or medications, counseling the patient and coordination of care as outlined above.    Thank you for allowing me to participate in the care of this patient.    Lionel Woodberry E  Walisiewicz, PA-C Department of Hematology/Oncology [facility] at [facility] Phone: [phone]  [phone]    11/22/2023 4:12 PM

## 2023-11-22 NOTE — Telephone Encounter (Signed)
 Patient called reporting diarrhea since last Friday and requested IV fluids. [place], [place] in [facility], who confirmed that patient can come in for labs and IV fluids today. Patient was informed and is aware and agreeable to today's appointments.

## 2023-11-23 DIAGNOSIS — R5381 Other malaise: Secondary | ICD-10-CM | POA: Diagnosis not present

## 2023-11-25 ENCOUNTER — Inpatient Hospital Stay: Attending: Internal Medicine

## 2023-11-25 ENCOUNTER — Inpatient Hospital Stay (HOSPITAL_BASED_OUTPATIENT_CLINIC_OR_DEPARTMENT_OTHER): Admitting: Physician Assistant

## 2023-11-25 ENCOUNTER — Ambulatory Visit: Admitting: Physical Therapy

## 2023-11-25 ENCOUNTER — Encounter: Payer: Self-pay | Admitting: Physical Therapy

## 2023-11-25 ENCOUNTER — Telehealth: Payer: Self-pay

## 2023-11-25 VITALS — BP 118/50 | HR 66 | Resp 18

## 2023-11-25 DIAGNOSIS — I2510 Atherosclerotic heart disease of native coronary artery without angina pectoris: Secondary | ICD-10-CM | POA: Diagnosis not present

## 2023-11-25 DIAGNOSIS — K5900 Constipation, unspecified: Secondary | ICD-10-CM | POA: Insufficient documentation

## 2023-11-25 DIAGNOSIS — Z7902 Long term (current) use of antithrombotics/antiplatelets: Secondary | ICD-10-CM | POA: Insufficient documentation

## 2023-11-25 DIAGNOSIS — D649 Anemia, unspecified: Secondary | ICD-10-CM

## 2023-11-25 DIAGNOSIS — N1831 Chronic kidney disease, stage 3a: Secondary | ICD-10-CM | POA: Insufficient documentation

## 2023-11-25 DIAGNOSIS — Z7982 Long term (current) use of aspirin: Secondary | ICD-10-CM | POA: Insufficient documentation

## 2023-11-25 DIAGNOSIS — N3289 Other specified disorders of bladder: Secondary | ICD-10-CM | POA: Diagnosis not present

## 2023-11-25 DIAGNOSIS — M48061 Spinal stenosis, lumbar region without neurogenic claudication: Secondary | ICD-10-CM | POA: Diagnosis not present

## 2023-11-25 DIAGNOSIS — C7800 Secondary malignant neoplasm of unspecified lung: Secondary | ICD-10-CM

## 2023-11-25 DIAGNOSIS — M1611 Unilateral primary osteoarthritis, right hip: Secondary | ICD-10-CM | POA: Insufficient documentation

## 2023-11-25 DIAGNOSIS — R197 Diarrhea, unspecified: Secondary | ICD-10-CM | POA: Diagnosis not present

## 2023-11-25 DIAGNOSIS — Z803 Family history of malignant neoplasm of breast: Secondary | ICD-10-CM | POA: Insufficient documentation

## 2023-11-25 DIAGNOSIS — Z79899 Other long term (current) drug therapy: Secondary | ICD-10-CM | POA: Insufficient documentation

## 2023-11-25 DIAGNOSIS — I129 Hypertensive chronic kidney disease with stage 1 through stage 4 chronic kidney disease, or unspecified chronic kidney disease: Secondary | ICD-10-CM | POA: Insufficient documentation

## 2023-11-25 DIAGNOSIS — I252 Old myocardial infarction: Secondary | ICD-10-CM | POA: Diagnosis not present

## 2023-11-25 DIAGNOSIS — M51369 Other intervertebral disc degeneration, lumbar region without mention of lumbar back pain or lower extremity pain: Secondary | ICD-10-CM | POA: Insufficient documentation

## 2023-11-25 DIAGNOSIS — C7910 Secondary malignant neoplasm of unspecified urinary organs: Secondary | ICD-10-CM

## 2023-11-25 DIAGNOSIS — Z881 Allergy status to other antibiotic agents status: Secondary | ICD-10-CM | POA: Insufficient documentation

## 2023-11-25 DIAGNOSIS — M47819 Spondylosis without myelopathy or radiculopathy, site unspecified: Secondary | ICD-10-CM | POA: Insufficient documentation

## 2023-11-25 DIAGNOSIS — Z809 Family history of malignant neoplasm, unspecified: Secondary | ICD-10-CM | POA: Insufficient documentation

## 2023-11-25 DIAGNOSIS — Z5112 Encounter for antineoplastic immunotherapy: Secondary | ICD-10-CM | POA: Diagnosis not present

## 2023-11-25 DIAGNOSIS — Z79890 Hormone replacement therapy (postmenopausal): Secondary | ICD-10-CM | POA: Insufficient documentation

## 2023-11-25 DIAGNOSIS — M533 Sacrococcygeal disorders, not elsewhere classified: Secondary | ICD-10-CM | POA: Diagnosis not present

## 2023-11-25 DIAGNOSIS — L513 Stevens-Johnson syndrome-toxic epidermal necrolysis overlap syndrome: Secondary | ICD-10-CM | POA: Diagnosis not present

## 2023-11-25 DIAGNOSIS — R110 Nausea: Secondary | ICD-10-CM

## 2023-11-25 DIAGNOSIS — M4316 Spondylolisthesis, lumbar region: Secondary | ICD-10-CM | POA: Insufficient documentation

## 2023-11-25 DIAGNOSIS — I700 Atherosclerosis of aorta: Secondary | ICD-10-CM | POA: Diagnosis not present

## 2023-11-25 DIAGNOSIS — Z9226 Personal history of immune checkpoint inhibitor therapy: Secondary | ICD-10-CM | POA: Insufficient documentation

## 2023-11-25 DIAGNOSIS — Z833 Family history of diabetes mellitus: Secondary | ICD-10-CM | POA: Insufficient documentation

## 2023-11-25 DIAGNOSIS — Z95828 Presence of other vascular implants and grafts: Secondary | ICD-10-CM

## 2023-11-25 DIAGNOSIS — Z885 Allergy status to narcotic agent status: Secondary | ICD-10-CM | POA: Insufficient documentation

## 2023-11-25 DIAGNOSIS — C679 Malignant neoplasm of bladder, unspecified: Secondary | ICD-10-CM | POA: Diagnosis not present

## 2023-11-25 DIAGNOSIS — Z8249 Family history of ischemic heart disease and other diseases of the circulatory system: Secondary | ICD-10-CM | POA: Insufficient documentation

## 2023-11-25 DIAGNOSIS — F17210 Nicotine dependence, cigarettes, uncomplicated: Secondary | ICD-10-CM | POA: Insufficient documentation

## 2023-11-25 DIAGNOSIS — I739 Peripheral vascular disease, unspecified: Secondary | ICD-10-CM | POA: Diagnosis not present

## 2023-11-25 DIAGNOSIS — Z905 Acquired absence of kidney: Secondary | ICD-10-CM | POA: Insufficient documentation

## 2023-11-25 DIAGNOSIS — Z7952 Long term (current) use of systemic steroids: Secondary | ICD-10-CM | POA: Insufficient documentation

## 2023-11-25 LAB — CMP (CANCER CENTER ONLY)
ALT: 13 U/L (ref 0–44)
AST: 12 U/L — ABNORMAL LOW (ref 15–41)
Albumin: 3.3 g/dL — ABNORMAL LOW (ref 3.5–5.0)
Alkaline Phosphatase: 66 U/L (ref 38–126)
Anion gap: 6 (ref 5–15)
BUN: 24 mg/dL — ABNORMAL HIGH (ref 8–23)
CO2: 25 mmol/L (ref 22–32)
Calcium: 8.6 mg/dL — ABNORMAL LOW (ref 8.9–10.3)
Chloride: 105 mmol/L (ref 98–111)
Creatinine: 1.31 mg/dL — ABNORMAL HIGH (ref 0.44–1.00)
GFR, Estimated: 43 mL/min — ABNORMAL LOW (ref >60)
Glucose, Bld: 98 mg/dL (ref 70–99)
Potassium: 4.4 mmol/L (ref 3.5–5.1)
Sodium: 136 mmol/L (ref 135–145)
Total Bilirubin: 0.2 mg/dL (ref 0.0–1.2)
Total Protein: 6.1 g/dL — ABNORMAL LOW (ref 6.5–8.1)

## 2023-11-25 LAB — CBC WITH DIFFERENTIAL (CANCER CENTER ONLY)
Abs Immature Granulocytes: 0.36 10*3/uL — ABNORMAL HIGH (ref 0.00–0.07)
Basophils Absolute: 0.1 10*3/uL (ref 0.0–0.1)
Basophils Relative: 1 %
Eosinophils Absolute: 0.1 10*3/uL (ref 0.0–0.5)
Eosinophils Relative: 2 %
HCT: 29.7 % — ABNORMAL LOW (ref 36.0–46.0)
Hemoglobin: 9.8 g/dL — ABNORMAL LOW (ref 12.0–15.0)
Immature Granulocytes: 6 %
Lymphocytes Relative: 37 %
Lymphs Abs: 2.4 10*3/uL (ref 0.7–4.0)
MCH: 32.1 pg (ref 26.0–34.0)
MCHC: 33 g/dL (ref 30.0–36.0)
MCV: 97.4 fL (ref 80.0–100.0)
Monocytes Absolute: 0.7 10*3/uL (ref 0.1–1.0)
Monocytes Relative: 11 %
Neutro Abs: 2.7 10*3/uL (ref 1.7–7.7)
Neutrophils Relative %: 43 %
Platelet Count: 277 10*3/uL (ref 150–400)
RBC: 3.05 MIL/uL — ABNORMAL LOW (ref 3.87–5.11)
RDW: 13.8 % (ref 11.5–15.5)
Smear Review: NORMAL
WBC Count: 6.3 10*3/uL (ref 4.0–10.5)
nRBC: 0 % (ref 0.0–0.2)

## 2023-11-25 LAB — PHOSPHORUS: Phosphorus: 2.4 mg/dL — ABNORMAL LOW (ref 2.5–4.6)

## 2023-11-25 LAB — MAGNESIUM: Magnesium: 1.5 mg/dL — ABNORMAL LOW (ref 1.7–2.4)

## 2023-11-25 MED ORDER — SODIUM CHLORIDE 0.9 % IV SOLN: Freq: Once | INTRAVENOUS | Status: AC

## 2023-11-25 MED ORDER — SODIUM CHLORIDE 0.9% FLUSH
10.0000 mL | Freq: Once | INTRAVENOUS | Status: AC
  Administered 2023-11-25: 10 mL

## 2023-11-25 MED ORDER — HEPARIN SOD (PORK) LOCK FLUSH 100 UNIT/ML IV SOLN
500.0000 [IU] | Freq: Once | INTRAVENOUS | Status: AC
  Administered 2023-11-25: 500 [IU]

## 2023-11-25 MED ORDER — MAGNESIUM SULFATE 2 GM/50ML IV SOLN
2.0000 g | Freq: Once | INTRAVENOUS | Status: AC
  Administered 2023-11-25: 2 g via INTRAVENOUS
  Filled 2023-11-25: qty 50

## 2023-11-25 NOTE — Patient Instructions (Signed)

## 2023-11-25 NOTE — Progress Notes (Signed)
 Symptom Management Consult Note [facility]    Patient Care Team: Wilburn Handler, MD as PCP - General (Family Medicine) Maximo Spar Aviva Lemmings, MD as PCP - Cardiology (Cardiology) Marlene Simas, MD as Consulting Physician (Oncology)    Name / MRN / DOB: Morgan Torres  [id number]  [birth year]   Date of visit: 11/25/2023   Chief Complaint/Reason for visit: recheck labs   Current Therapy: sacituzumab govitecan  (Trodelvy ) 10 mg/kg on days 1 and 8 every 3 weeks   Last treatment:  Day 8   Cycle 1 on 11/12/23    ASSESSMENT AND PLAN Patient is a 73 y.o. female with oncologic history of metastatic urothelial carcinoma followed by Dr. [name].  I have viewed most recent oncology note and lab work.  #Metastatic urothelial carcinoma - Next appointment with oncologist is tomorrow 11/26/23.   #Nausea - Controlled with Compazine . Discussed adding zofran  if needed.  #Diarrhea - Resolved. Now with mild constipation. Passing flatus, non tender abdomen. Doubt SBO. - Creatinine improved 1.31 (from 2.14 x 3 days ago). Sodium WNL. Magnesium  slightly low at 1.5. Patient taking PO daily. Will administer 2g IV mag in clinic today.  #Anemia - HgB today is 9.8, down from 11.7 x 3 days ago. Patient denies active bleeding and is asymptomatic of this. Could be dilutional from IVF. Will continue to monitor with future lab draws.  Strict ED precautions discussed should symptoms worsen.   HEME/ONC HISTORY Oncology History  Metastatic urothelial carcinoma (HCC)  05/30/2022 Initial Diagnosis   Metastatic urothelial carcinoma (HCC)   06/21/2022 - 10/20/2022 Chemotherapy   Patient is on Treatment Plan : BLADDER Carboplatin  D1 + Gemcitabine  D1,8 q21d     11/14/2022 - 01/24/2023 Chemotherapy   Patient is on Treatment Plan : BLADDER Avelumab  q14d     08/16/2023 - 08/29/2023 Chemotherapy   Patient is on Treatment Plan : UROTHELIAL LOCALLY ADVANCED, METASTATIC Enfortumab D1,8,15 q28d      11/06/2023 -  Chemotherapy   Patient is on Treatment Plan : bladder Sacituzumab govitecan -hziy (Trodelvy ) D1,8 q21d     Bladder cancer metastasized to lung (HCC)  05/30/2022 Initial Diagnosis   Bladder cancer metastasized to lung (HCC)   05/30/2022 Cancer Staging   Staging form: Lung, AJCC 8th Edition - Clinical: Stage IVA (cT2a, cN0, cM1a) - Signed by Marlene Simas, MD on 05/30/2022   11/06/2023 -  Chemotherapy   Patient is on Treatment Plan : bladder Sacituzumab govitecan -hziy (Trodelvy ) D1,8 q21d         INTERVAL HISTORY  Discussed the use of AI scribe software for clinical note transcription with the patient, who gave verbal consent to proceed.    Morgan Torres is a 73 y.o. female with oncologic history as above presenting to [facility] today with chief complaint of recheck labs. Patient is accompanied by family member who provides additional history.  She experiences ongoing nausea, which is controlled with Compazine  (prochlorperazine ). No abdominal pain is reported, and she maintains a good appetite, consuming one or two Ensure drinks per day and eating good-sized meals. She feels much better compared to her visit last week.  She has been experiencing constipation now. She took Imodium  on Friday after her last visit, which may have contributed to her current constipation. She admits to "passing a lot of gas." Denies fever or chills.   ROS  All other systems are reviewed and are negative for acute change except as noted in the HPI.    Allergies  Allergen Reactions  Diclofenac Anaphylaxis   Padcev  [Enfortumab Vedotin ] Other (See Comments)    Rogue Clear Syndrome   Ciprofloxacin Rash   Codeine Itching   Hydrocodone Itching and Other (See Comments)    Skin broke out while taking Norco 7.5/325     Past Medical History:  Diagnosis Date   Anxiety    Arthritis    hip, lumbar spine    Asthma    seasonal allergies    Bronchitis    Hx: of   Chronic kidney disease     COPD (chronic obstructive pulmonary disease) (HCC)    Coronary artery disease    Depression    Fibromyalgia    Hypertension    Lumbar herniated disc    Metastatic urothelial carcinoma (HCC)    Myocardial infarction (HCC) 06/25/2012   followed by Dr. Maximo Spar, treated medically, no stents   Peripheral arterial disease (HCC)    of right foot   Pneumonia    Hx: of yrs ago   Sciatica    Sleep apnea    Small bowel obstruction (HCC)    several yrs ago     Past Surgical History:  Procedure Laterality Date   ABDOMINAL SURGERY     resection of small intestine   ANTERIOR CRUCIATE LIGAMENT REPAIR Bilateral    BACK SURGERY     fusion of lumbar   CARDIAC CATHETERIZATION  06/25/2012   COLON SURGERY     resection for bowel - for obstruction  6 to 7 yrs ago per pt on 01-05-2022   COLONOSCOPY     Hx; of   DILATION AND CURETTAGE OF UTERUS     EYE SURGERY Bilateral    cataracts removed   HERNIA REPAIR  02/23/1969   inguinal hernia    IR IMAGING GUIDED PORT INSERTION  06/07/2022   LEFT HEART CATHETERIZATION WITH CORONARY ANGIOGRAM N/A 03/30/2013   Procedure: LEFT HEART CATHETERIZATION WITH CORONARY ANGIOGRAM;  Surgeon: Arleen Lacer, MD;  Location: [facility] CATH LAB;  Service: Cardiovascular;  Laterality: N/A;   left thumb surgery     yrs ago   ROBOT ASSITED LAPAROSCOPIC NEPHROURETERECTOMY Left 03/09/2021   Procedure: XI ROBOT ASSITED LAPAROSCOPIC NEPHROURETERECTOMY/ CYSTOSCOPY WITH LEFT URETEROSCOPY WITH TRANSURETHRAL RESECTION OF URETERAL ORIFICE;  Surgeon: Adelbert Homans, MD;  Location: [facility] ORS;  Service: Urology;  Laterality: Left;   SALIVARY GLAND SURGERY Left    approached from inside mouth & side of neck 1970-1980   TONSILLECTOMY     as child   TOTAL HIP ARTHROPLASTY Left 05/28/2014   Procedure: LEFT TOTAL HIP ARTHROPLASTY;  Surgeon: Forbes Ida., MD;  Location: [facility] OR;  Service: Orthopedics;  Laterality: Left;   TRANSURETHRAL RESECTION OF BLADDER TUMOR N/A 08/23/2021    Procedure: TRANSURETHRAL RESECTION OF BLADDER TUMOR (TURBT) WITH CYSTOSCOPY/ POSTOPERATIVE INSTILLATION OF GEMCITABINE /retrograde pyelogram and stent placement (right);  Surgeon: Adelbert Homans, MD;  Location: [facility];  Service: Urology;  Laterality: N/A;   TRANSURETHRAL RESECTION OF BLADDER TUMOR N/A 01/10/2022   Procedure: TRANSURETHRAL RESECTION OF BLADDER TUMOR (TURBT) WITH CYSTOSCOPY / GEMCITABINE  INSTILLATION post-operatively;  Surgeon: Adelbert Homans, MD;  Location: [facility];  Service: Urology;  Laterality: N/A;  ONLY NEEDS 30 MIN    Social History   Socioeconomic History   Marital status: Married    Spouse name: Not on file   Number of children: Not on file   Years of education: Not on file   Highest education level: Not on file  Occupational History  Not on file  Tobacco Use   Smoking status: Some Days    Current packs/day: 0.50    Average packs/day: 0.5 packs/day for 25.0 years (12.5 ttl pk-yrs)    Types: Cigarettes   Smokeless tobacco: Never   Tobacco comments:    Currently on the Nicoderm patch."smokes a few"  Vaping Use   Vaping status: Never Used  Substance and Sexual Activity   Alcohol  use: Yes    Comment: occasionally   Drug use: No   Sexual activity: Not Currently  Other Topics Concern   Not on file  Social History Narrative   Not on file   Social Drivers of Health   Financial Resource Strain: Not on file  Food Insecurity: Low Risk  (09/12/2023)   Received from [facility]   Hunger Vital Sign    Worried About Running Out of Food in the Last Year: Never true    Ran Out of Food in the Last Year: Never true  Transportation Needs: No Transportation Needs (09/24/2023)   Received from [facility]   PRAPARE - Transportation    Lack of Transportation (Medical): No    Lack of Transportation (Non-Medical): No  Physical Activity: Not on file  Stress: Not on file  Social Connections: Socially Integrated  (09/02/2023)   Social Connection and Isolation Panel [NHANES]    Frequency of Communication with Friends and Family: Three times a week    Frequency of Social Gatherings with Friends and Family: Once a week    Attends Religious Services: More than 4 times per year    Active Member of [employer] or Organizations: Yes    Attends Engineer, structural: More than 4 times per year    Marital Status: Married  Catering manager Violence: Not At Risk (09/02/2023)   Humiliation, Afraid, Rape, and Kick questionnaire    Fear of Current or Ex-Partner: No    Emotionally Abused: No    Physically Abused: No    Sexually Abused: No    Family History  Problem Relation Age of Onset   Hypertension Mother    Diabetes Mother    Cancer - Other Mother    Cancer Sister    Breast cancer Daughter      Current Outpatient Medications:    acetaminophen  (TYLENOL ) 500 MG tablet, Take 1 tablet (500 mg total) by mouth every 6 (six) hours as needed for mild pain., Disp: 30 tablet, Rfl: 0   albuterol  (PROVENTIL  HFA;VENTOLIN  HFA) 108 (90 Base) MCG/ACT inhaler, Inhale 2 puffs into the lungs every 6 (six) hours as needed for wheezing or shortness of breath., Disp: 1 Inhaler, Rfl: 0   amLODipine  (NORVASC ) 5 MG tablet, Take 5 mg by mouth daily. Prescribed by [facility], Disp: , Rfl:    aspirin  EC 81 MG tablet, Take 81 mg by mouth daily. Swallow whole., Disp: , Rfl:    benzonatate  (TESSALON ) 100 MG capsule, Take 100 mg by mouth 3 (three) times daily as needed for cough., Disp: , Rfl:    calcium  acetate (PHOSLO ) 667 MG tablet, Take 667 mg by mouth 3 (three) times daily with meals., Disp: , Rfl:    cilostazol  (PLETAL ) 50 MG tablet, Take 50 mg by mouth 2 (two) times daily., Disp: , Rfl:    clotrimazole  (LOTRIMIN ) 1 % external solution, Apply topically 2 (two) times daily., Disp: 30 mL, Rfl: 0   dexamethasone  (DECADRON ) 4 MG tablet, Take 2 tablets (8 mg) daily for 3 days. Start the day after chemotherapy. (Patient not taking:  Reported on 11/05/2023), Disp: 30 tablet, Rfl: 1   EPINEPHrine  (EPIPEN  2-PAK) 0.3 mg/0.3 mL DEVI, Inject 0.3 mLs (0.3 mg total) into the muscle once as needed (for severe allergic reaction). CAll 911 immediately if you have to use this medicine (Patient taking differently: Inject 0.3 mg into the muscle once as needed (for a severe allergic reaction- CALL 9-1-1 IMMEDIATELY IF USED).), Disp: 1 Device, Rfl: 1   gabapentin  (NEURONTIN ) 300 MG capsule, Take 300 mg by mouth at bedtime., Disp: , Rfl:    guaiFENesin  (MUCINEX ) 600 MG 12 hr tablet, Take 2 tablets (1,200 mg total) by mouth 2 (two) times daily. (Patient taking differently: Take 1,200 mg by mouth 2 (two) times daily as needed for to loosen phlegm.), Disp: 120 tablet, Rfl: 0   HYDROcodone-acetaminophen  (NORCO) 7.5-325 MG tablet, Take 1 tablet by mouth., Disp: , Rfl:    HYDROcodone-acetaminophen  (NORCO/VICODIN) 5-325 MG tablet, Take 1 tablet by mouth., Disp: , Rfl:    ipratropium (ATROVENT ) 0.06 % nasal spray, Place 2 sprays into the nose., Disp: , Rfl:    Lanolin Alcohol  WAX, Apply topically., Disp: , Rfl:    levothyroxine  (SYNTHROID ) 100 MCG tablet, Take 1 tablet (100 mcg total) by mouth daily before breakfast., Disp: 30 tablet, Rfl: 1   lidocaine -prilocaine  (EMLA ) cream, Apply 1 Application topically as needed., Disp: 30 g, Rfl: 2   loperamide  (IMODIUM ) 2 MG capsule, Take 2 tabs by mouth with first loose stool, then 1 tab with each additional loose stool as needed. Do not exceed 8 tabs in a 24-hour period (Patient not taking: Reported on 11/05/2023), Disp: 60 capsule, Rfl: 3   LORazepam  (ATIVAN ) 0.5 MG tablet, Take 0.5 mg by mouth daily as needed for anxiety., Disp: , Rfl:    magnesium  oxide (MAG-OX) 400 (240 Mg) MG tablet, Take 1 tablet (400 mg total) by mouth daily., Disp: 30 tablet, Rfl: 1   metoprolol  tartrate (LOPRESSOR ) 25 MG tablet, Take 1 tablet by mouth 2 (two) times daily., Disp: , Rfl:    MILK OF MAGNESIA 400 MG/5ML suspension, Take 15-30  mLs by mouth daily as needed for mild constipation., Disp: , Rfl:    montelukast  (SINGULAIR ) 10 MG tablet, Take 10 mg by mouth., Disp: , Rfl:    naloxone  (NARCAN ) 2 MG/2ML injection, as needed for opioid reversal. Spray the contents of 1 device into 1 nostril. Call 911. May repeat with 2nd device in alternate nostril if no response in 2-3 minutes. (Patient not taking: Reported on 11/05/2023), Disp: , Rfl:    nicotine  (NICODERM CQ  - DOSED IN MG/24 HOURS) 21 mg/24hr patch, Place 21 mg onto the skin daily., Disp: , Rfl:    nicotine  polacrilex (NICORETTE ) 4 MG gum, Take 4 mg by mouth as needed for smoking cessation., Disp: , Rfl:    nitroGLYCERIN  (NITROSTAT ) 0.4 MG SL tablet, Place 1 tablet (0.4 mg total) under the tongue every 5 (five) minutes x 3 doses as needed for chest pain., Disp: 25 tablet, Rfl: 2   ondansetron  (ZOFRAN ) 8 MG tablet, Take 1 tablet (8 mg total) by mouth every 8 (eight) hours as needed for nausea or vomiting. Start on the third day after chemotherapy., Disp: 30 tablet, Rfl: 1   oxyCODONE  (OXY IR/ROXICODONE ) 5 MG immediate release tablet, Take 5 mg by mouth 3 (three) times daily as needed., Disp: , Rfl:    prochlorperazine  (COMPAZINE ) 10 MG tablet, Take 1 tablet (10 mg total) by mouth every 6 (six) hours as needed., Disp: 30 tablet, Rfl: 2  sodium chloride  1 g tablet, Take 1 tablet (1 g total) by mouth daily. Split 1 gram into two halves and take by mouth once a day, Disp: 30 tablet, Rfl: 0   temazepam  (RESTORIL ) 15 MG capsule, Take 1 capsule (15 mg total) by mouth at bedtime as needed for sleep., Disp: 30 capsule, Rfl: 0   traMADol  (ULTRAM ) 50 MG tablet, Take 50 mg by mouth every 6 (six) hours as needed (for pain)., Disp: , Rfl:  No current facility-administered medications for this visit.  Facility-Administered Medications Ordered in Other Visits:    heparin  lock flush 100 unit/mL, 500 Units, Intracatheter, Once, Marlene Simas, MD   magnesium  sulfate IVPB 2 g 50 mL, 2 g,  Intravenous, Once, Walisiewicz, Nyana Haren E, PA-C, Last Rate: 50 mL/hr at 11/25/23 1057, 2 g at 11/25/23 1057   sodium chloride  flush (NS) 0.9 % injection 10 mL, 10 mL, Intracatheter, Once, Marlene Simas, MD  PHYSICAL EXAM ECOG FS:1 - Symptomatic but completely ambulatory    Vitals:   11/25/23 0950  BP: 130/78  Pulse: 68  Resp: 18  Temp: (!) 97 F (36.1 C)  TempSrc: Temporal  SpO2: 100%  Weight: 171 lb 8 oz (77.8 kg)   Physical Exam Vitals and nursing note reviewed.  Constitutional:      Appearance: She is not ill-appearing or toxic-appearing.  HENT:     Head: Normocephalic.  Eyes:     Conjunctiva/sclera: Conjunctivae normal.  Cardiovascular:     Rate and Rhythm: Normal rate and regular rhythm.     Pulses: Normal pulses.     Heart sounds: Normal heart sounds.  Pulmonary:     Effort: Pulmonary effort is normal.     Breath sounds: Normal breath sounds.  Abdominal:     General: There is no distension.     Tenderness: There is no abdominal tenderness.  Musculoskeletal:     Cervical back: Normal range of motion.     Right lower leg: No edema.     Left lower leg: No edema.  Skin:    General: Skin is warm and dry.  Neurological:     Mental Status: She is alert.        LABORATORY DATA I have reviewed the data as listed    Latest Ref Rng & Units 11/25/2023    9:54 AM 11/22/2023    2:36 PM 11/12/2023    9:24 AM  CBC  WBC 4.0 - 10.5 K/uL 6.3  5.3  3.6   Hemoglobin 12.0 - 15.0 g/dL 9.8  78.2  95.6   Hematocrit 36.0 - 46.0 % 29.7  33.8  35.2   Platelets 150 - 400 K/uL 277  284  266         Latest Ref Rng & Units 11/25/2023    9:54 AM 11/22/2023    2:36 PM 11/12/2023    9:24 AM  CMP  Glucose 70 - 99 mg/dL 98  213  086   BUN 8 - 23 mg/dL 24  34  26   Creatinine 0.44 - 1.00 mg/dL 5.78  4.69  6.29   Sodium 135 - 145 mmol/L 136  129  137   Potassium 3.5 - 5.1 mmol/L 4.4  3.9  4.3   Chloride 98 - 111 mmol/L 105  97  101   CO2 22 - 32 mmol/L 25  23  30    Calcium  8.9  - 10.3 mg/dL 8.6  8.9  9.1   Total Protein 6.5 - 8.1 g/dL 6.1  7.1  6.5   Total Bilirubin 0.0 - 1.2 mg/dL 0.2  0.3  0.2   Alkaline Phos 38 - 126 U/L 66  80  79   AST 15 - 41 U/L 12  11  13    ALT 0 - 44 U/L 13  11  10         RADIOGRAPHIC STUDIES (from last 24 hours if applicable) I have personally reviewed the radiological images as listed and agreed with the findings in the report. No results found.      Visit Diagnosis: 1. Hypomagnesemia   2. Metastatic urothelial carcinoma (HCC)   3. Nausea without vomiting   4. Anemia, unspecified type      No orders of the defined types were placed in this encounter.   All questions were answered. The patient knows to call the clinic with any problems, questions or concerns. No barriers to learning was detected.  A total of more than 30 minutes were spent on this encounter with face-to-face time and non-face-to-face time, including preparing to see the patient, ordering tests and/or medications, counseling the patient and coordination of care as outlined above.    Thank you for allowing me to participate in the care of this patient.    Esmeralda Malay E  Walisiewicz, PA-C Department of Hematology/Oncology [facility] at [facility] Phone: [phone]  [phone]    11/25/2023 11:05 AM

## 2023-11-25 NOTE — Telephone Encounter (Signed)
 Patient called in regards to today's appointments.  Patient scheduled for 9:30 appointments in [facility]. Patient reports that she is checking in late, but is here in the building.  Rosalind Columbia, PA-C aware.

## 2023-11-25 NOTE — Therapy (Signed)
 [facility] [address] [place], [place], [address] Phone: [phone]   Fax:  [phone]  Patient Details  Name: Morgan Torres MRN: [id number] Date of Birth: 01/26/1951 Referring Provider:  No ref. provider found  Encounter Date: 11/25/2023  PHYSICAL THERAPY DISCHARGE SUMMARY  Visits from Start of Care: 6  Current functional level related to goals / functional outcomes: Unable to assess as pt has not been to PT since 11/11/23   Remaining deficits: Decr strength, impaired balance, gait abnormalities, decr activity tolerance    Education / Equipment: HEP    Patient agrees to discharge. Patient goals were not met. Patient is being discharged due to a change in medical status. Pt receiving chemo at this time and PT is too much for pt at the same time. Discussed if pt wants to return to PT in the future will need a new referral, pt verbalized understanding.    Seabron Cypress, PT, DPT 11/25/2023, 11:49 AM  [facility] [address] [place], [place], [address] Phone: [phone]   Fax:  [phone]

## 2023-11-25 NOTE — Progress Notes (Signed)
 Per Cassie Heilingoetter, PA-C - okay to proceed with tomorrow's treatment with today's magnesium  level of 1.5.  Patient receiving 2 gm IV mag in clinic on 6/2.

## 2023-11-26 ENCOUNTER — Other Ambulatory Visit

## 2023-11-26 ENCOUNTER — Inpatient Hospital Stay: Admitting: Internal Medicine

## 2023-11-26 ENCOUNTER — Inpatient Hospital Stay

## 2023-11-26 DIAGNOSIS — N1831 Chronic kidney disease, stage 3a: Secondary | ICD-10-CM | POA: Diagnosis not present

## 2023-11-26 DIAGNOSIS — D649 Anemia, unspecified: Secondary | ICD-10-CM | POA: Diagnosis not present

## 2023-11-26 DIAGNOSIS — M47819 Spondylosis without myelopathy or radiculopathy, site unspecified: Secondary | ICD-10-CM | POA: Diagnosis not present

## 2023-11-26 DIAGNOSIS — R197 Diarrhea, unspecified: Secondary | ICD-10-CM | POA: Diagnosis not present

## 2023-11-26 DIAGNOSIS — C7910 Secondary malignant neoplasm of unspecified urinary organs: Secondary | ICD-10-CM

## 2023-11-26 DIAGNOSIS — M4316 Spondylolisthesis, lumbar region: Secondary | ICD-10-CM | POA: Diagnosis not present

## 2023-11-26 DIAGNOSIS — I129 Hypertensive chronic kidney disease with stage 1 through stage 4 chronic kidney disease, or unspecified chronic kidney disease: Secondary | ICD-10-CM | POA: Diagnosis not present

## 2023-11-26 DIAGNOSIS — Z5112 Encounter for antineoplastic immunotherapy: Secondary | ICD-10-CM | POA: Diagnosis not present

## 2023-11-26 DIAGNOSIS — C7800 Secondary malignant neoplasm of unspecified lung: Secondary | ICD-10-CM | POA: Diagnosis not present

## 2023-11-26 DIAGNOSIS — L513 Stevens-Johnson syndrome-toxic epidermal necrolysis overlap syndrome: Secondary | ICD-10-CM | POA: Diagnosis not present

## 2023-11-26 DIAGNOSIS — M1611 Unilateral primary osteoarthritis, right hip: Secondary | ICD-10-CM | POA: Diagnosis not present

## 2023-11-26 DIAGNOSIS — M51369 Other intervertebral disc degeneration, lumbar region without mention of lumbar back pain or lower extremity pain: Secondary | ICD-10-CM | POA: Diagnosis not present

## 2023-11-26 DIAGNOSIS — M533 Sacrococcygeal disorders, not elsewhere classified: Secondary | ICD-10-CM | POA: Diagnosis not present

## 2023-11-26 DIAGNOSIS — I739 Peripheral vascular disease, unspecified: Secondary | ICD-10-CM | POA: Diagnosis not present

## 2023-11-26 DIAGNOSIS — C679 Malignant neoplasm of bladder, unspecified: Secondary | ICD-10-CM | POA: Diagnosis not present

## 2023-11-26 DIAGNOSIS — M48061 Spinal stenosis, lumbar region without neurogenic claudication: Secondary | ICD-10-CM | POA: Diagnosis not present

## 2023-11-26 DIAGNOSIS — K5900 Constipation, unspecified: Secondary | ICD-10-CM | POA: Diagnosis not present

## 2023-11-26 DIAGNOSIS — I252 Old myocardial infarction: Secondary | ICD-10-CM | POA: Diagnosis not present

## 2023-11-26 DIAGNOSIS — I700 Atherosclerosis of aorta: Secondary | ICD-10-CM | POA: Diagnosis not present

## 2023-11-26 DIAGNOSIS — N3289 Other specified disorders of bladder: Secondary | ICD-10-CM | POA: Diagnosis not present

## 2023-11-26 DIAGNOSIS — R110 Nausea: Secondary | ICD-10-CM | POA: Diagnosis not present

## 2023-11-26 DIAGNOSIS — F17210 Nicotine dependence, cigarettes, uncomplicated: Secondary | ICD-10-CM | POA: Diagnosis not present

## 2023-11-26 DIAGNOSIS — I2510 Atherosclerotic heart disease of native coronary artery without angina pectoris: Secondary | ICD-10-CM | POA: Diagnosis not present

## 2023-11-26 MED ORDER — SODIUM CHLORIDE 0.9 % IV SOLN
2.0000 g | Freq: Once | INTRAVENOUS | Status: DC
  Filled 2023-11-26: qty 4

## 2023-11-26 MED ORDER — SODIUM CHLORIDE 0.9 % IV SOLN: INTRAVENOUS | Status: DC

## 2023-11-26 MED ORDER — ACETAMINOPHEN 325 MG PO TABS
650.0000 mg | ORAL_TABLET | Freq: Once | ORAL | Status: AC
  Administered 2023-11-26: 650 mg via ORAL
  Filled 2023-11-26: qty 2

## 2023-11-26 MED ORDER — HEPARIN SOD (PORK) LOCK FLUSH 100 UNIT/ML IV SOLN
500.0000 [IU] | Freq: Once | INTRAVENOUS | Status: AC | PRN
  Administered 2023-11-26: 500 [IU]

## 2023-11-26 MED ORDER — SODIUM CHLORIDE 0.9 % IV SOLN
10.0000 mg/kg | Freq: Once | INTRAVENOUS | Status: AC
  Administered 2023-11-26: 720 mg via INTRAVENOUS
  Filled 2023-11-26: qty 72

## 2023-11-26 MED ORDER — PALONOSETRON HCL INJECTION 0.25 MG/5ML
0.2500 mg | Freq: Once | INTRAVENOUS | Status: AC
  Administered 2023-11-26: 0.25 mg via INTRAVENOUS
  Filled 2023-11-26: qty 5

## 2023-11-26 MED ORDER — DEXAMETHASONE SODIUM PHOSPHATE 10 MG/ML IJ SOLN
10.0000 mg | Freq: Once | INTRAMUSCULAR | Status: AC
  Administered 2023-11-26: 10 mg via INTRAVENOUS
  Filled 2023-11-26: qty 1

## 2023-11-26 MED ORDER — SODIUM CHLORIDE 0.9% FLUSH
10.0000 mL | INTRAVENOUS | Status: DC | PRN
  Administered 2023-11-26: 10 mL

## 2023-11-26 MED ORDER — PHOSPHORUS SUPPLEMENT 280-160-250 MG PO PACK: 1.0000 | PACK | Freq: Two times a day (BID) | ORAL | 0 refills | Status: DC

## 2023-11-26 MED ORDER — MAGNESIUM SULFATE 2 GM/50ML IV SOLN
2.0000 g | Freq: Once | INTRAVENOUS | Status: AC
  Administered 2023-11-26: 2 g via INTRAVENOUS
  Filled 2023-11-26: qty 50

## 2023-11-26 MED ORDER — SODIUM CHLORIDE 0.9 % IV SOLN
150.0000 mg | Freq: Once | INTRAVENOUS | Status: AC
  Administered 2023-11-26: 150 mg via INTRAVENOUS
  Filled 2023-11-26: qty 150

## 2023-11-26 MED ORDER — FAMOTIDINE IN NACL 20-0.9 MG/50ML-% IV SOLN
20.0000 mg | Freq: Once | INTRAVENOUS | Status: AC
  Administered 2023-11-26: 20 mg via INTRAVENOUS
  Filled 2023-11-26: qty 50

## 2023-11-26 MED ORDER — DIPHENHYDRAMINE HCL 50 MG/ML IJ SOLN
50.0000 mg | Freq: Once | INTRAMUSCULAR | Status: AC
  Administered 2023-11-26: 50 mg via INTRAVENOUS
  Filled 2023-11-26: qty 1

## 2023-11-26 NOTE — Progress Notes (Signed)
 [facility] [phone]   [phone]  OFFICE PROGRESS NOTE  Wilburn Handler, MD [address] [place] [address]  DIAGNOSIS: Metastatic urothelial carcinoma that was initially diagnosed as stage II (T2, N0, M0) in September 2022 status post right nephroureterectomy as well as intravesical treatment and resection of superficial tumor in the bladder several times and March 2023 as well as July 2023.  The patient was found to have enlarging and new pulmonary nodules consistent with metastatic disease in November 2023.    PRIOR THERAPY: 1) Right nephroureterectomy as well as intravesical treatment and resection of superficial tumor in the bladder several times and March 2023 as well as July 2023.  2) Palliative systemic chemotherapy with carboplatin  for AUC of 5 on day 1 and gemcitabine  1000 mg/M2 on days 1 and 8 every 3 weeks. First cycle June 21, 2022. Status post 6 cycles. Her dose of carboplatin  was reduced to AUC of 4 and gemcitabine  to 800 mg/m2 starting from cycle #5 due to cytopenias. 3) Maintenance immunotherapy with Avelumab  800 mg IV every 2 weeks. First dose on 11/15/22. Status post 6 cycle of treatment. 4) Erdafitinib  (balversa ) 8 mg p.o daily. First dose 02/22/23. Status post 4 months of treatment. Discontinued in February 2025 due to disease progression.  5) Enfortumab vedotin  infusion therapy: once a week for three weeks, followed by one week off, repeated every four weeks. First dose was on 08/08/2023.  Status post 1 cycle.  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 First dose 11/05/2023  INTERVAL HISTORY: Morgan Torres 73 y.o. female returns to the clinic today for follow-up visit. Discussed the use of AI scribe software for  clinical note transcription with the patient, who gave verbal consent to proceed.  History of Present Illness   Morgan Torres is a 73 year old female with metastatic urothelial carcinoma who presents for evaluation before starting cycle number two of sacituzumab govitecan  treatment. She is accompanied by her sister.  Diagnosed with metastatic urothelial carcinoma in November 2023, she has undergone several treatments and is currently receiving sacituzumab govitecan , having completed one cycle.  During the initial phase of treatment, she experienced severe diarrhea, which has since resolved with the use of Imodium . The diarrhea was described as occurring 'all day'. Currently, she does not have diarrhea.  She reports feeling fatigued and in need of 'a little kick'. No nausea, vomiting, chest pain, or breathing issues.        MEDICAL HISTORY: Past Medical History:  Diagnosis Date   Anxiety    Arthritis    hip, lumbar spine    Asthma    seasonal allergies    Bronchitis    Hx: of   Chronic kidney disease    COPD (chronic obstructive pulmonary disease) (HCC)    Coronary artery disease    Depression    Fibromyalgia    Hypertension    Lumbar herniated disc    Metastatic urothelial carcinoma (HCC)    Myocardial infarction (HCC) 06/25/2012   followed by Dr. Maximo Spar, treated medically, no stents   Peripheral arterial disease (HCC)    of right foot   Pneumonia    Hx: of yrs ago   Sciatica    Sleep apnea    Small  bowel obstruction (HCC)    several yrs ago    ALLERGIES:  is allergic to diclofenac, padcev  [enfortumab vedotin ], ciprofloxacin, codeine, and hydrocodone.  MEDICATIONS:  Current Outpatient Medications  Medication Sig Dispense Refill   acetaminophen  (TYLENOL ) 500 MG tablet Take 1 tablet (500 mg total) by mouth every 6 (six) hours as needed for mild pain. 30 tablet 0   albuterol  (PROVENTIL  HFA;VENTOLIN  HFA) 108 (90 Base) MCG/ACT inhaler Inhale 2 puffs into the lungs every 6  (six) hours as needed for wheezing or shortness of breath. 1 Inhaler 0   amLODipine  (NORVASC ) 5 MG tablet Take 5 mg by mouth daily. Prescribed by [facility]     aspirin  EC 81 MG tablet Take 81 mg by mouth daily. Swallow whole.     benzonatate  (TESSALON ) 100 MG capsule Take 100 mg by mouth 3 (three) times daily as needed for cough.     calcium  acetate (PHOSLO ) 667 MG tablet Take 667 mg by mouth 3 (three) times daily with meals.     cilostazol  (PLETAL ) 50 MG tablet Take 50 mg by mouth 2 (two) times daily.     clotrimazole  (LOTRIMIN ) 1 % external solution Apply topically 2 (two) times daily. 30 mL 0   dexamethasone  (DECADRON ) 4 MG tablet Take 2 tablets (8 mg) daily for 3 days. Start the day after chemotherapy. (Patient not taking: Reported on 11/05/2023) 30 tablet 1   EPINEPHrine  (EPIPEN  2-PAK) 0.3 mg/0.3 mL DEVI Inject 0.3 mLs (0.3 mg total) into the muscle once as needed (for severe allergic reaction). CAll 911 immediately if you have to use this medicine (Patient taking differently: Inject 0.3 mg into the muscle once as needed (for a severe allergic reaction- CALL 9-1-1 IMMEDIATELY IF USED).) 1 Device 1   gabapentin  (NEURONTIN ) 300 MG capsule Take 300 mg by mouth at bedtime.     guaiFENesin  (MUCINEX ) 600 MG 12 hr tablet Take 2 tablets (1,200 mg total) by mouth 2 (two) times daily. (Patient taking differently: Take 1,200 mg by mouth 2 (two) times daily as needed for to loosen phlegm.) 120 tablet 0   HYDROcodone-acetaminophen  (NORCO) 7.5-325 MG tablet Take 1 tablet by mouth.     HYDROcodone-acetaminophen  (NORCO/VICODIN) 5-325 MG tablet Take 1 tablet by mouth.     ipratropium (ATROVENT ) 0.06 % nasal spray Place 2 sprays into the nose.     Lanolin Alcohol  WAX Apply topically.     levothyroxine  (SYNTHROID ) 100 MCG tablet Take 1 tablet (100 mcg total) by mouth daily before breakfast. 30 tablet 1   lidocaine -prilocaine  (EMLA ) cream Apply 1 Application topically as needed. 30 g 2   loperamide  (IMODIUM ) 2 MG  capsule Take 2 tabs by mouth with first loose stool, then 1 tab with each additional loose stool as needed. Do not exceed 8 tabs in a 24-hour period (Patient not taking: Reported on 11/05/2023) 60 capsule 3   LORazepam  (ATIVAN ) 0.5 MG tablet Take 0.5 mg by mouth daily as needed for anxiety.     magnesium  oxide (MAG-OX) 400 (240 Mg) MG tablet Take 1 tablet (400 mg total) by mouth daily. 30 tablet 1   metoprolol  tartrate (LOPRESSOR ) 25 MG tablet Take 1 tablet by mouth 2 (two) times daily.     MILK OF MAGNESIA 400 MG/5ML suspension Take 15-30 mLs by mouth daily as needed for mild constipation.     montelukast  (SINGULAIR ) 10 MG tablet Take 10 mg by mouth.     naloxone  (NARCAN ) 2 MG/2ML injection as needed for opioid reversal. Spray the contents  of 1 device into 1 nostril. Call 911. May repeat with 2nd device in alternate nostril if no response in 2-3 minutes. (Patient not taking: Reported on 11/05/2023)     nicotine  (NICODERM CQ  - DOSED IN MG/24 HOURS) 21 mg/24hr patch Place 21 mg onto the skin daily.     nicotine  polacrilex (NICORETTE ) 4 MG gum Take 4 mg by mouth as needed for smoking cessation.     nitroGLYCERIN  (NITROSTAT ) 0.4 MG SL tablet Place 1 tablet (0.4 mg total) under the tongue every 5 (five) minutes x 3 doses as needed for chest pain. 25 tablet 2   ondansetron  (ZOFRAN ) 8 MG tablet Take 1 tablet (8 mg total) by mouth every 8 (eight) hours as needed for nausea or vomiting. Start on the third day after chemotherapy. 30 tablet 1   oxyCODONE  (OXY IR/ROXICODONE ) 5 MG immediate release tablet Take 5 mg by mouth 3 (three) times daily as needed.     prochlorperazine  (COMPAZINE ) 10 MG tablet Take 1 tablet (10 mg total) by mouth every 6 (six) hours as needed. 30 tablet 2   sodium chloride  1 g tablet Take 1 tablet (1 g total) by mouth daily. Split 1 gram into two halves and take by mouth once a day 30 tablet 0   temazepam  (RESTORIL ) 15 MG capsule Take 1 capsule (15 mg total) by mouth at bedtime as needed for  sleep. 30 capsule 0   traMADol  (ULTRAM ) 50 MG tablet Take 50 mg by mouth every 6 (six) hours as needed (for pain).     No current facility-administered medications for this visit.    SURGICAL HISTORY:  Past Surgical History:  Procedure Laterality Date   ABDOMINAL SURGERY     resection of small intestine   ANTERIOR CRUCIATE LIGAMENT REPAIR Bilateral    BACK SURGERY     fusion of lumbar   CARDIAC CATHETERIZATION  06/25/2012   COLON SURGERY     resection for bowel - for obstruction  6 to 7 yrs ago per pt on 01-05-2022   COLONOSCOPY     Hx; of   DILATION AND CURETTAGE OF UTERUS     EYE SURGERY Bilateral    cataracts removed   HERNIA REPAIR  02/23/1969   inguinal hernia    IR IMAGING GUIDED PORT INSERTION  06/07/2022   LEFT HEART CATHETERIZATION WITH CORONARY ANGIOGRAM N/A 03/30/2013   Procedure: LEFT HEART CATHETERIZATION WITH CORONARY ANGIOGRAM;  Surgeon: Arleen Lacer, MD;  Location: [facility] CATH LAB;  Service: Cardiovascular;  Laterality: N/A;   left thumb surgery     yrs ago   ROBOT ASSITED LAPAROSCOPIC NEPHROURETERECTOMY Left 03/09/2021   Procedure: XI ROBOT ASSITED LAPAROSCOPIC NEPHROURETERECTOMY/ CYSTOSCOPY WITH LEFT URETEROSCOPY WITH TRANSURETHRAL RESECTION OF URETERAL ORIFICE;  Surgeon: Adelbert Homans, MD;  Location: [facility] ORS;  Service: Urology;  Laterality: Left;   SALIVARY GLAND SURGERY Left    approached from inside mouth & side of neck 1970-1980   TONSILLECTOMY     as child   TOTAL HIP ARTHROPLASTY Left 05/28/2014   Procedure: LEFT TOTAL HIP ARTHROPLASTY;  Surgeon: Forbes Ida., MD;  Location: [facility] OR;  Service: Orthopedics;  Laterality: Left;   TRANSURETHRAL RESECTION OF BLADDER TUMOR N/A 08/23/2021   Procedure: TRANSURETHRAL RESECTION OF BLADDER TUMOR (TURBT) WITH CYSTOSCOPY/ POSTOPERATIVE INSTILLATION OF GEMCITABINE /retrograde pyelogram and stent placement (right);  Surgeon: Adelbert Homans, MD;  Location: [facility];  Service:  Urology;  Laterality: N/A;   TRANSURETHRAL RESECTION OF BLADDER TUMOR N/A 01/10/2022  Procedure: TRANSURETHRAL RESECTION OF BLADDER TUMOR (TURBT) WITH CYSTOSCOPY / GEMCITABINE  INSTILLATION post-operatively;  Surgeon: Adelbert Homans, MD;  Location: [facility];  Service: Urology;  Laterality: N/A;  ONLY NEEDS 30 MIN    REVIEW OF SYSTEMS:  Constitutional: positive for fatigue Eyes: negative Ears, nose, mouth, throat, and face: negative Respiratory: negative Cardiovascular: negative Gastrointestinal: negative Genitourinary:negative Integument/breast: negative Hematologic/lymphatic: negative Musculoskeletal:negative Neurological: negative Behavioral/Psych: negative Endocrine: negative Allergic/Immunologic: negative   PHYSICAL EXAMINATION: General appearance: alert, cooperative, fatigued, and no distress Head: Normocephalic, without obvious abnormality, atraumatic Neck: no adenopathy, no JVD, supple, symmetrical, trachea midline, and thyroid  not enlarged, symmetric, no tenderness/mass/nodules Lymph nodes: Cervical, supraclavicular, and axillary nodes normal. Resp: clear to auscultation bilaterally Back: symmetric, no curvature. ROM normal. No CVA tenderness. Cardio: regular rate and rhythm, S1, S2 normal, no murmur, click, rub or gallop GI: soft, non-tender; bowel sounds normal; no masses,  no organomegaly Extremities: extremities normal, atraumatic, no cyanosis or edema Neurologic: Alert and oriented X 3, normal strength and tone. Normal symmetric reflexes. Normal coordination and gait  ECOG PERFORMANCE STATUS: 1 - Symptomatic but completely ambulatory  Blood pressure (!) 130/53, pulse 74, temperature (!) 97.3 F (36.3 C), temperature source Temporal, resp. rate 17, weight 172 lb 3.2 oz (78.1 kg), SpO2 97%.  LABORATORY DATA: Lab Results  Component Value Date   WBC 6.3 11/25/2023   HGB 9.8 (L) 11/25/2023   HCT 29.7 (L) 11/25/2023   MCV 97.4 11/25/2023    PLT 277 11/25/2023      Chemistry      Component Value Date/Time   NA 136 11/25/2023 0954   K 4.4 11/25/2023 0954   CL 105 11/25/2023 0954   CO2 25 11/25/2023 0954   BUN 24 (H) 11/25/2023 0954   CREATININE 1.31 (H) 11/25/2023 0954      Component Value Date/Time   CALCIUM  8.6 (L) 11/25/2023 0954   ALKPHOS 66 11/25/2023 0954   AST 12 (L) 11/25/2023 0954   ALT 13 11/25/2023 0954   BILITOT 0.2 11/25/2023 0954       RADIOGRAPHIC STUDIES: No results found.  ASSESSMENT AND PLAN: This is a very pleasant 73 years old African-American female with metastatic urothelial carcinoma that was initially diagnosed as stage II in September 2022 but has evidence for disease progression with pulmonary metastasis in November 2023.  She underwent several treatment in the past including systemic chemotherapy with carboplatin  and gemcitabine  followed by maintenance treatment with Avelumab  discontinued secondary to disease progression.  Then the patient underwent treatment with erdafitinib  for 4 months discontinued secondary to disease progression.  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  She was seen for a second opinion by Dr. Zada Herrlich at [facility] and she recommended retesting for HER2 expression which unfortunately came back negative.  It was recommended for the patient to consider treatment with Sacituzumab Govitecan .  She started treatment with sacituzumab govitecan  at dose of 10 Mg/KG on days 1 and 8 every 3 weeks.  First dose was 11/06/2023.  She is status post 1 cycle. Assessment and Plan    Metastatic urothelial carcinoma Diagnosed in November 2023, currently undergoing treatment with sacituzumab govitecan . Completed one cycle, preparing for the second. Reports mild fatigue, no nausea, vomiting, chest pain, or dyspnea. - Proceed with  sacituzumab govitecan  treatment today. - Administer Decadron  tablet for three days as premedication. - Schedule follow-up in  three weeks.  Diarrhea Severe diarrhea during the first cycle of sacituzumab govitecan , now resolved with Imodium . - Continue Imodium  as needed. - Instruct to call if diarrhea worsens for potential additional treatment.  Hypomagnesemia and hypophosphatemia Low magnesium  and phosphorus levels identified. - Administer magnesium  sulfate and phosphorus supplements during treatment.  She was advised to call immediately if she has any other concerning symptoms in the interval.  The patient voices understanding of current disease status and treatment options and is in agreement with the current care plan.  All questions were answered. The patient knows to call the clinic with any problems, questions or concerns. We can certainly see the patient much sooner if necessary.  The total time spent in the appointment was 30 minutes including review of chart and various tests results, discussions about plan of care and coordination of care plan .   Disclaimer: This note was dictated with voice recognition software. Similar sounding words can inadvertently be transcribed and may not be corrected upon review.

## 2023-11-26 NOTE — Patient Instructions (Signed)
 [facility] - A DEPT OF [facility]  Discharge Instructions: Thank you for choosing [facility] to provide your oncology and hematology care.   If you have a lab appointment with the Cancer Center, please go directly to the Cancer Center and check in at the registration area.   Wear comfortable clothing and clothing appropriate for easy access to any Portacath or PICC line.   We strive to give you quality time with your provider. You may need to reschedule your appointment if you arrive late (15 or more minutes).  Arriving late affects you and other patients whose appointments are after yours.  Also, if you miss three or more appointments without notifying the office, you may be dismissed from the clinic at the provider's discretion.      For prescription refill requests, have your pharmacy contact our office and allow 72 hours for refills to be completed.    Today you received the following chemotherapy and/or immunotherapy agents: Trodelvy.      To help prevent nausea and vomiting after your treatment, we encourage you to take your nausea medication as directed.  BELOW ARE SYMPTOMS THAT SHOULD BE REPORTED IMMEDIATELY: *FEVER GREATER THAN 100.4 F (38 C) OR HIGHER *CHILLS OR SWEATING *NAUSEA AND VOMITING THAT IS NOT CONTROLLED WITH YOUR NAUSEA MEDICATION *UNUSUAL SHORTNESS OF BREATH *UNUSUAL BRUISING OR BLEEDING *URINARY PROBLEMS (pain or burning when urinating, or frequent urination) *BOWEL PROBLEMS (unusual diarrhea, constipation, pain near the anus) TENDERNESS IN MOUTH AND THROAT WITH OR WITHOUT PRESENCE OF ULCERS (sore throat, sores in mouth, or a toothache) UNUSUAL RASH, SWELLING OR PAIN  UNUSUAL VAGINAL DISCHARGE OR ITCHING   Items with * indicate a potential emergency and should be followed up as soon as possible or go to the Emergency Department if any problems should occur.  Please show the CHEMOTHERAPY ALERT CARD or IMMUNOTHERAPY  ALERT CARD at check-in to the Emergency Department and triage nurse.  Should you have questions after your visit or need to cancel or reschedule your appointment, please contact [facility] - A DEPT OF [facility]  Dept: [phone]  and follow the prompts.  Office hours are 8:00 a.m. to 4:30 p.m. Monday - Friday. Please note that voicemails left after 4:00 p.m. may not be returned until the following business day.  We are closed weekends and major holidays. You have access to a nurse at all times for urgent questions. Please call the main number to the clinic Dept: [phone] and follow the prompts.   For any non-urgent questions, you may also contact your provider using MyChart. We now offer e-Visits for anyone 76 and older to request care online for non-urgent symptoms. For details visit [email or web address].   Also download the MyChart app! Go to the app store, search "MyChart", open the app, select [place], and log in with your MyChart username and password.  Sacituzumab Govitecan Injection What is this medication? SACITUZUMAB GOVETECAN (SAK i TOOZ ue mab GOE vi TEE can) treats breast cancer. It works by blocking a protein that causes cancer cells to grow and multiply. This helps to slow or stop the spread of cancer cells. This medicine may be used for other purposes; ask your health care provider or pharmacist if you have questions. COMMON BRAND NAME(S): TRODELVY What should I tell my care team before I take this medication? They need to know if you have any of these conditions:  Carry the UGT1A1*28 gene Infection Liver disease An unusual or allergic reaction to sacituzumab govitecan, other medications, foods, dyes, or preservatives Pregnant or trying to get pregnant Breast-feeding How should I use this medication? This medication is injected into a vein. It is given by your care team in a hospital or clinic setting. Talk to your care team about  the use of this medication in children. Special care may be needed. Overdosage: If you think you have taken too much of this medicine contact a poison control center or emergency room at once. NOTE: This medicine is only for you. Do not share this medicine with others. What if I miss a dose? Keep appointments for follow-up doses. It is important not to miss your dose. Call your care team if you are unable to keep an appointment. What may interact with this medication? This medication may affect how other medications work, and other medications may affect the way this medication works. Talk with your care team about all of the medications you take. They may suggest changes to your treatment plan to lower the risk of side effects and to make sure your medications work as intended. This list may not describe all possible interactions. Give your health care provider a list of all the medicines, herbs, non-prescription drugs, or dietary supplements you use. Also tell them if you smoke, drink alcohol, or use illegal drugs. Some items may interact with your medicine. What should I watch for while using this medication? This medication may make you feel generally unwell. This is not uncommon as chemotherapy can affect healthy cells as well as cancer cells. Report any side effects. Continue your course of treatment even though you feel ill unless your care team tells you to stop. You may need blood work while you are taking this medication. Certain genetic factors may decrease the safety of this medication. Your care team may use genetic tests to determine treatment. This medication can cause serious allergic reactions. To reduce your risk, your care team may give you other medications to take before receiving this one. Be sure to follow the directions from your care team. Check with your care team if you have severe diarrhea, nausea, and vomiting, or if you sweat a lot. The loss of too much body fluid may make  it dangerous for you to take this medication. Talk to your care team if you wish to become pregnant or think you might be pregnant. This medication can cause serious birth defects if taken during pregnancy or if you get pregnant within 6 months after stopping treatment. A negative pregnancy test is required before starting this medication. A reliable form of contraception is recommended while taking this medication and for 6 months after stopping treatment. Talk to your care team about reliable forms of contraception. Use a condom during sex and for 3 months after stopping treatment. Tell your care team right away if you think your partner might be pregnant. This medication can cause serious birth defects. Do not breast-feed while taking this medication and for 1 month after stopping therapy. This medication may cause infertility. Talk to your care team if you are concerned about your fertility. This medication may increase your risk of getting an infection. Call your care team for advice if you get a fever, chills, sore throat, or other symptoms of a cold or flu. Do not treat yourself. Try to avoid being around people who are sick. Avoid taking medications that contain aspirin, acetaminophen, ibuprofen, naproxen, or ketoprofen  unless instructed by your care team. These medications may hide a fever. This medication may increase blood sugar. The risk may be higher in patients who already have diabetes. Ask your care team what you can do to lower your risk of diabetes while taking this medication. What side effects may I notice from receiving this medication? Side effects that you should report to your care team as soon as possible: Allergic reactions--skin rash, itching, hives, swelling of the face, lips, tongue, or throat Infection--fever, chills, cough, or sore throat Infusion reactions--chest pain, shortness of breath or trouble breathing, feeling faint or lightheaded Low red blood cell level--unusual  weakness or fatigue, dizziness, headache, trouble breathing Severe or prolonged diarrhea Side effects that usually do not require medical attention (report these to your care team if they continue or are bothersome): Constipation Diarrhea Fatigue Hair loss Loss of appetite Nausea Vomiting This list may not describe all possible side effects. Call your doctor for medical advice about side effects. You may report side effects to FDA at 1-800-FDA-1088. Where should I keep my medication? This medication is given in a hospital or clinic. It will not be stored at home. NOTE: This sheet is a summary. It may not cover all possible information. If you have questions about this medicine, talk to your doctor, pharmacist, or health care provider.  2024 Elsevier/Gold Standard (2023-05-24 00:00:00)

## 2023-11-27 ENCOUNTER — Other Ambulatory Visit: Payer: Self-pay | Admitting: Physician Assistant

## 2023-11-27 ENCOUNTER — Ambulatory Visit: Admitting: Physical Therapy

## 2023-12-01 ENCOUNTER — Emergency Department (HOSPITAL_COMMUNITY)
Admission: EM | Admit: 2023-12-01 | Discharge: 2023-12-01 | Disposition: A | Discharge status: Home / Self Care | Attending: Emergency Medicine | Admitting: Emergency Medicine

## 2023-12-01 ENCOUNTER — Emergency Department (HOSPITAL_COMMUNITY)

## 2023-12-01 ENCOUNTER — Encounter (HOSPITAL_COMMUNITY): Payer: Self-pay

## 2023-12-01 DIAGNOSIS — M79604 Pain in right leg: Secondary | ICD-10-CM | POA: Diagnosis not present

## 2023-12-01 DIAGNOSIS — Z85118 Personal history of other malignant neoplasm of bronchus and lung: Secondary | ICD-10-CM | POA: Insufficient documentation

## 2023-12-01 DIAGNOSIS — M5441 Lumbago with sciatica, right side: Secondary | ICD-10-CM | POA: Diagnosis not present

## 2023-12-01 DIAGNOSIS — M5431 Sciatica, right side: Secondary | ICD-10-CM | POA: Diagnosis not present

## 2023-12-01 DIAGNOSIS — R1031 Right lower quadrant pain: Secondary | ICD-10-CM | POA: Diagnosis present

## 2023-12-01 DIAGNOSIS — Z7982 Long term (current) use of aspirin: Secondary | ICD-10-CM | POA: Diagnosis not present

## 2023-12-01 DIAGNOSIS — Z96642 Presence of left artificial hip joint: Secondary | ICD-10-CM | POA: Diagnosis not present

## 2023-12-01 DIAGNOSIS — M1611 Unilateral primary osteoarthritis, right hip: Secondary | ICD-10-CM | POA: Diagnosis not present

## 2023-12-01 MED ORDER — DIAZEPAM 5 MG PO TABS
5.0000 mg | ORAL_TABLET | Freq: Once | ORAL | Status: AC
  Administered 2023-12-01: 5 mg via ORAL
  Filled 2023-12-01: qty 1

## 2023-12-01 MED ORDER — MORPHINE SULFATE (PF) 4 MG/ML IV SOLN
6.0000 mg | Freq: Once | INTRAVENOUS | Status: AC
  Administered 2023-12-01: 6 mg via INTRAVENOUS

## 2023-12-01 MED ORDER — OXYCODONE-ACETAMINOPHEN 5-325 MG PO TABS: 1.0000 | ORAL_TABLET | Freq: Four times a day (QID) | ORAL | 0 refills | Status: DC | PRN

## 2023-12-01 MED ORDER — METHOCARBAMOL 500 MG PO TABS: 500.0000 mg | ORAL_TABLET | Freq: Two times a day (BID) | ORAL | 0 refills | Status: AC

## 2023-12-01 MED ORDER — MORPHINE SULFATE (PF) 4 MG/ML IV SOLN
6.0000 mg | Freq: Once | INTRAVENOUS | Status: DC
  Filled 2023-12-01: qty 2

## 2023-12-01 NOTE — ED Triage Notes (Signed)
 Pt arrives via POV.  Pt reports since yesterday, she has been experiencing pain in right lower back that radiates down her right leg. Pt has hx of sciatica. PT is currently on chemo. Denies fevers.

## 2023-12-01 NOTE — ED Provider Notes (Signed)
 [place] EMERGENCY DEPARTMENT AT [facility] Provider Note   CSN: [id number] Arrival date & time: 12/01/23  1308     History  No chief complaint on file.   Morgan Torres is a 73 y.o. female.  74 year old patient presents with right-sided flank pain going down to her right leg.  History of sciatica and this feels similar.  She does have a history of lung cancer but states that it is not metastatic.  No bowel or bladder dysfunction.  States that the pain is sharp and worse when she tries to move around.  No distal numbness or tingling to her right foot.  Denies any chest pain or shortness of breath.  Denies any urinary symptoms.  Unresponsive to home medications       Home Medications Prior to Admission medications   Medication Sig Start Date End Date Taking? Authorizing Provider  acetaminophen  (TYLENOL ) 500 MG tablet Take 1 tablet (500 mg total) by mouth every 6 (six) hours as needed for mild pain. 10/14/22   Rising, Ivette Marks, PA-C  albuterol  (PROVENTIL  HFA;VENTOLIN  HFA) 108 (90 Base) MCG/ACT inhaler Inhale 2 puffs into the lungs every 6 (six) hours as needed for wheezing or shortness of breath. 05/10/16   Carlee Charters, NP  amLODipine  (NORVASC ) 5 MG tablet Take 5 mg by mouth daily. Prescribed by [facility]    [provider]  aspirin  EC 81 MG tablet Take 81 mg by mouth daily. Swallow whole.    [provider]  benzonatate  (TESSALON ) 100 MG capsule Take 100 mg by mouth 3 (three) times daily as needed for cough. 12/18/22   Wilburn Handler, MD  calcium  acetate (PHOSLO ) 667 MG tablet Take 667 mg by mouth 3 (three) times daily with meals.    [provider]  cilostazol  (PLETAL ) 50 MG tablet Take 50 mg by mouth 2 (two) times daily.    [provider]  clotrimazole  (LOTRIMIN ) 1 % external solution Apply topically 2 (two) times daily. 07/20/23   Regalado, Belkys A, MD  dexamethasone  (DECADRON ) 4 MG tablet Take 2 tablets (8 mg) daily for 3 days. Start  the day after chemotherapy. Patient not taking: Reported on 11/05/2023 10/29/23   Marlene Simas, MD  EPINEPHrine  (EPIPEN  2-PAK) 0.3 mg/0.3 mL DEVI Inject 0.3 mLs (0.3 mg total) into the muscle once as needed (for severe allergic reaction). CAll 911 immediately if you have to use this medicine Patient taking differently: Inject 0.3 mg into the muscle once as needed (for a severe allergic reaction- CALL 9-1-1 IMMEDIATELY IF USED). 11/26/12   Piepenbrink, Bridgette Campus, PA-C  gabapentin  (NEURONTIN ) 300 MG capsule Take 300 mg by mouth at bedtime. 09/24/23   [provider]  guaiFENesin  (MUCINEX ) 600 MG 12 hr tablet Take 2 tablets (1,200 mg total) by mouth 2 (two) times daily. Patient taking differently: Take 1,200 mg by mouth 2 (two) times daily as needed for to loosen phlegm. 07/20/23   Regalado, Belkys A, MD  HYDROcodone-acetaminophen  (NORCO) 7.5-325 MG tablet Take 1 tablet by mouth. 08/27/23   [provider]  HYDROcodone-acetaminophen  (NORCO/VICODIN) 5-325 MG tablet Take 1 tablet by mouth. 11/12/22   [provider]  ipratropium (ATROVENT ) 0.06 % nasal spray Place 2 sprays into the nose. 09/24/23   [provider]  Lanolin Alcohol  WAX Apply topically. 09/24/23 12/23/23  [provider]  levothyroxine  (SYNTHROID ) 100 MCG tablet Take 1 tablet (100 mcg total) by mouth daily before breakfast. 11/12/23   Marlene Simas, MD  lidocaine -prilocaine  (EMLA ) cream Apply  1 Application topically as needed. 10/30/23   Heilingoetter, Cassandra L, PA-C  loperamide  (IMODIUM ) 2 MG capsule Take 2 tabs by mouth with first loose stool, then 1 tab with each additional loose stool as needed. Do not exceed 8 tabs in a 24-hour period Patient not taking: Reported on 11/05/2023 10/29/23   Marlene Simas, MD  LORazepam  (ATIVAN ) 0.5 MG tablet Take 0.5 mg by mouth daily as needed for anxiety. 07/12/23   [provider]  magnesium  oxide (MAG-OX) 400 (240 Mg) MG tablet TAKE 1 TABLET BY MOUTH EVERY  DAY 11/27/23   Heilingoetter, Cassandra L, PA-C  metoprolol  tartrate (LOPRESSOR ) 25 MG tablet Take 1 tablet by mouth 2 (two) times daily. 09/24/23   [provider]  MILK OF MAGNESIA 400 MG/5ML suspension Take 15-30 mLs by mouth daily as needed for mild constipation.    [provider]  montelukast  (SINGULAIR ) 10 MG tablet Take 10 mg by mouth.    [provider]  naloxone  (NARCAN ) 2 MG/2ML injection as needed for opioid reversal. Spray the contents of 1 device into 1 nostril. Call 911. May repeat with 2nd device in alternate nostril if no response in 2-3 minutes. Patient not taking: Reported on 11/05/2023 09/24/23 12/23/23  [provider]  nicotine  (NICODERM CQ  - DOSED IN MG/24 HOURS) 21 mg/24hr patch Place 21 mg onto the skin daily.    [provider]  nicotine  polacrilex (NICORETTE ) 4 MG gum Take 4 mg by mouth as needed for smoking cessation.    [provider]  nitroGLYCERIN  (NITROSTAT ) 0.4 MG SL tablet Place 1 tablet (0.4 mg total) under the tongue every 5 (five) minutes x 3 doses as needed for chest pain. 09/25/18   Hilty, Aviva Lemmings, MD  ondansetron  (ZOFRAN ) 8 MG tablet Take 1 tablet (8 mg total) by mouth every 8 (eight) hours as needed for nausea or vomiting. Start on the third day after chemotherapy. 10/29/23   Marlene Simas, MD  oxyCODONE  (OXY IR/ROXICODONE ) 5 MG immediate release tablet Take 5 mg by mouth 3 (three) times daily as needed. 09/24/23   [provider]  Potassium & Sodium Phosphates (PHOSPHORUS  SUPPLEMENT) 280-160-250 MG PACK Take 1 Package by mouth 2 (two) times daily. 11/26/23   Marlene Simas, MD  prochlorperazine  (COMPAZINE ) 10 MG tablet Take 1 tablet (10 mg total) by mouth every 6 (six) hours as needed. 11/06/23   Heilingoetter, Cassandra L, PA-C  sodium chloride  1 g tablet Take 1 tablet (1 g total) by mouth daily. Split 1 gram into two halves and take by mouth once a day 11/05/23   Heilingoetter, Cassandra L, PA-C  temazepam   (RESTORIL ) 15 MG capsule Take 1 capsule (15 mg total) by mouth at bedtime as needed for sleep. 02/06/23   Heilingoetter, Cassandra L, PA-C  traMADol  (ULTRAM ) 50 MG tablet Take 50 mg by mouth every 6 (six) hours as needed (for pain).    [provider]      Allergies    Diclofenac, Padcev  [enfortumab vedotin ], Ciprofloxacin, Codeine, and Hydrocodone    Review of Systems   Review of Systems  All other systems reviewed and are negative.   Physical Exam Updated Vital Signs BP 122/72 (BP Location: Left Arm)   Pulse 73   Temp 97.7 F (36.5 C)   Resp 18   SpO2 99%  Physical Exam Vitals and nursing note reviewed.  Constitutional:      General: She is not in acute distress.    Appearance: Normal appearance. She is well-developed.  She is not toxic-appearing.  HENT:     Head: Normocephalic and atraumatic.  Eyes:     General: Lids are normal.     Conjunctiva/sclera: Conjunctivae normal.     Pupils: Pupils are equal, round, and reactive to light.  Neck:     Thyroid : No thyroid  mass.     Trachea: No tracheal deviation.  Cardiovascular:     Rate and Rhythm: Normal rate and regular rhythm.     Heart sounds: Normal heart sounds. No murmur heard.    No gallop.  Pulmonary:     Effort: Pulmonary effort is normal. No respiratory distress.     Breath sounds: Normal breath sounds. No stridor. No decreased breath sounds, wheezing, rhonchi or rales.  Abdominal:     General: There is no distension.     Palpations: Abdomen is soft.     Tenderness: There is no abdominal tenderness. There is no rebound.  Musculoskeletal:        General: No tenderness. Normal range of motion.     Cervical back: Normal range of motion and neck supple.       Legs:     Comments: Neurovascular status intact at right foot  Strength is 5/5 in right lower extremity  Skin:    General: Skin is warm and dry.     Findings: No abrasion or rash.  Neurological:     Mental Status: She is alert and oriented to  person, place, and time. Mental status is at baseline.     GCS: GCS eye subscore is 4. GCS verbal subscore is 5. GCS motor subscore is 6.     Cranial Nerves: No cranial nerve deficit.     Sensory: No sensory deficit.     Motor: Motor function is intact.  Psychiatric:        Attention and Perception: Attention normal.        Speech: Speech normal.        Behavior: Behavior normal.     ED Results / Procedures / Treatments   Labs (all labs ordered are listed, but only abnormal results are displayed) Labs Reviewed - No data to display  EKG None  Radiology No results found.  Procedures Procedures    Medications Ordered in ED Medications  diazepam  (VALIUM ) tablet 5 mg (has no administration in time range)  morphine  (PF) 4 MG/ML injection 6 mg (has no administration in time range)    ED Course/ Medical Decision Making/ A&P                                 Medical Decision Making Amount and/or Complexity of Data Reviewed Radiology: ordered.  Risk Prescription drug management.   Patient medicated for pain and feels better here.  Plain x-rays of patient right hip and pelvis showed no signs of metastatic lesions.  Will prescribe analgesics and muscle relaxants and discharged home.  Family at bedside and are in agreement        Final Clinical Impression(s) / ED Diagnoses Final diagnoses:  None    Rx / DC Orders ED Discharge Orders     None         Lind Repine, MD 12/01/23 1027

## 2023-12-01 NOTE — ED Notes (Signed)
 ED staff @ bedside for US  IV

## 2023-12-01 NOTE — ED Notes (Addendum)
 Pt. Receives chemo and has a port but does not want it accessed. IV team consulted for US .

## 2023-12-01 NOTE — ED Notes (Signed)
 Xray bedside.

## 2023-12-02 ENCOUNTER — Other Ambulatory Visit: Payer: Self-pay | Admitting: Physician Assistant

## 2023-12-02 ENCOUNTER — Ambulatory Visit: Admitting: Physical Therapy

## 2023-12-02 DIAGNOSIS — C679 Malignant neoplasm of bladder, unspecified: Secondary | ICD-10-CM

## 2023-12-02 MED FILL — Fosaprepitant Dimeglumine For IV Infusion 150 MG (Base Eq): INTRAVENOUS | Qty: 5 | Status: AC

## 2023-12-03 ENCOUNTER — Inpatient Hospital Stay

## 2023-12-03 ENCOUNTER — Encounter: Payer: Self-pay | Admitting: Internal Medicine

## 2023-12-03 VITALS — BP 104/70 | HR 84 | Temp 98.7°F | Resp 16 | Wt 167.5 lb

## 2023-12-03 DIAGNOSIS — M47819 Spondylosis without myelopathy or radiculopathy, site unspecified: Secondary | ICD-10-CM | POA: Diagnosis not present

## 2023-12-03 DIAGNOSIS — C679 Malignant neoplasm of bladder, unspecified: Secondary | ICD-10-CM | POA: Diagnosis not present

## 2023-12-03 DIAGNOSIS — I252 Old myocardial infarction: Secondary | ICD-10-CM | POA: Diagnosis not present

## 2023-12-03 DIAGNOSIS — E039 Hypothyroidism, unspecified: Secondary | ICD-10-CM

## 2023-12-03 DIAGNOSIS — M48061 Spinal stenosis, lumbar region without neurogenic claudication: Secondary | ICD-10-CM | POA: Diagnosis not present

## 2023-12-03 DIAGNOSIS — D649 Anemia, unspecified: Secondary | ICD-10-CM | POA: Diagnosis not present

## 2023-12-03 DIAGNOSIS — F17210 Nicotine dependence, cigarettes, uncomplicated: Secondary | ICD-10-CM | POA: Diagnosis not present

## 2023-12-03 DIAGNOSIS — M1611 Unilateral primary osteoarthritis, right hip: Secondary | ICD-10-CM | POA: Diagnosis not present

## 2023-12-03 DIAGNOSIS — Z95828 Presence of other vascular implants and grafts: Secondary | ICD-10-CM

## 2023-12-03 DIAGNOSIS — M51369 Other intervertebral disc degeneration, lumbar region without mention of lumbar back pain or lower extremity pain: Secondary | ICD-10-CM | POA: Diagnosis not present

## 2023-12-03 DIAGNOSIS — M533 Sacrococcygeal disorders, not elsewhere classified: Secondary | ICD-10-CM | POA: Diagnosis not present

## 2023-12-03 DIAGNOSIS — L513 Stevens-Johnson syndrome-toxic epidermal necrolysis overlap syndrome: Secondary | ICD-10-CM | POA: Diagnosis not present

## 2023-12-03 DIAGNOSIS — T451X5A Adverse effect of antineoplastic and immunosuppressive drugs, initial encounter: Secondary | ICD-10-CM

## 2023-12-03 DIAGNOSIS — C7800 Secondary malignant neoplasm of unspecified lung: Secondary | ICD-10-CM | POA: Diagnosis not present

## 2023-12-03 DIAGNOSIS — I2510 Atherosclerotic heart disease of native coronary artery without angina pectoris: Secondary | ICD-10-CM | POA: Diagnosis not present

## 2023-12-03 DIAGNOSIS — I739 Peripheral vascular disease, unspecified: Secondary | ICD-10-CM | POA: Diagnosis not present

## 2023-12-03 DIAGNOSIS — N1831 Chronic kidney disease, stage 3a: Secondary | ICD-10-CM | POA: Diagnosis not present

## 2023-12-03 DIAGNOSIS — K5900 Constipation, unspecified: Secondary | ICD-10-CM | POA: Diagnosis not present

## 2023-12-03 DIAGNOSIS — N3289 Other specified disorders of bladder: Secondary | ICD-10-CM | POA: Diagnosis not present

## 2023-12-03 DIAGNOSIS — I700 Atherosclerosis of aorta: Secondary | ICD-10-CM | POA: Diagnosis not present

## 2023-12-03 DIAGNOSIS — C7910 Secondary malignant neoplasm of unspecified urinary organs: Secondary | ICD-10-CM

## 2023-12-03 DIAGNOSIS — Z5112 Encounter for antineoplastic immunotherapy: Secondary | ICD-10-CM | POA: Diagnosis not present

## 2023-12-03 DIAGNOSIS — R110 Nausea: Secondary | ICD-10-CM | POA: Diagnosis not present

## 2023-12-03 DIAGNOSIS — I129 Hypertensive chronic kidney disease with stage 1 through stage 4 chronic kidney disease, or unspecified chronic kidney disease: Secondary | ICD-10-CM | POA: Diagnosis not present

## 2023-12-03 DIAGNOSIS — R197 Diarrhea, unspecified: Secondary | ICD-10-CM | POA: Diagnosis not present

## 2023-12-03 DIAGNOSIS — M4316 Spondylolisthesis, lumbar region: Secondary | ICD-10-CM | POA: Diagnosis not present

## 2023-12-03 LAB — CBC WITH DIFFERENTIAL (CANCER CENTER ONLY)
Abs Immature Granulocytes: 0.05 10*3/uL (ref 0.00–0.07)
Basophils Absolute: 0 10*3/uL (ref 0.0–0.1)
Basophils Relative: 1 %
Eosinophils Absolute: 0 10*3/uL (ref 0.0–0.5)
Eosinophils Relative: 1 %
HCT: 33.8 % — ABNORMAL LOW (ref 36.0–46.0)
Hemoglobin: 11.2 g/dL — ABNORMAL LOW (ref 12.0–15.0)
Immature Granulocytes: 2 %
Lymphocytes Relative: 65 %
Lymphs Abs: 2.1 10*3/uL (ref 0.7–4.0)
MCH: 32.2 pg (ref 26.0–34.0)
MCHC: 33.1 g/dL (ref 30.0–36.0)
MCV: 97.1 fL (ref 80.0–100.0)
Monocytes Absolute: 0.4 10*3/uL (ref 0.1–1.0)
Monocytes Relative: 12 %
Neutro Abs: 0.6 10*3/uL — ABNORMAL LOW (ref 1.7–7.7)
Neutrophils Relative %: 19 %
Platelet Count: 375 10*3/uL (ref 150–400)
RBC: 3.48 MIL/uL — ABNORMAL LOW (ref 3.87–5.11)
RDW: 14.2 % (ref 11.5–15.5)
WBC Count: 3.3 10*3/uL — ABNORMAL LOW (ref 4.0–10.5)
nRBC: 0 % (ref 0.0–0.2)

## 2023-12-03 LAB — CMP (CANCER CENTER ONLY)
ALT: 18 U/L (ref 0–44)
AST: 14 U/L — ABNORMAL LOW (ref 15–41)
Albumin: 3.5 g/dL (ref 3.5–5.0)
Alkaline Phosphatase: 71 U/L (ref 38–126)
Anion gap: 7 (ref 5–15)
BUN: 25 mg/dL — ABNORMAL HIGH (ref 8–23)
CO2: 27 mmol/L (ref 22–32)
Calcium: 8.7 mg/dL — ABNORMAL LOW (ref 8.9–10.3)
Chloride: 100 mmol/L (ref 98–111)
Creatinine: 1.43 mg/dL — ABNORMAL HIGH (ref 0.44–1.00)
GFR, Estimated: 38 mL/min — ABNORMAL LOW (ref >60)
Glucose, Bld: 124 mg/dL — ABNORMAL HIGH (ref 70–99)
Potassium: 4.8 mmol/L (ref 3.5–5.1)
Sodium: 134 mmol/L — ABNORMAL LOW (ref 135–145)
Total Bilirubin: 0.3 mg/dL (ref 0.0–1.2)
Total Protein: 6.2 g/dL — ABNORMAL LOW (ref 6.5–8.1)

## 2023-12-03 LAB — SAMPLE TO BLOOD BANK

## 2023-12-03 LAB — PHOSPHORUS: Phosphorus: 3.9 mg/dL (ref 2.5–4.6)

## 2023-12-03 LAB — MAGNESIUM: Magnesium: 1.6 mg/dL — ABNORMAL LOW (ref 1.7–2.4)

## 2023-12-03 LAB — TSH: TSH: 33.9 u[IU]/mL — ABNORMAL HIGH (ref 0.350–4.500)

## 2023-12-03 MED ORDER — HEPARIN SOD (PORK) LOCK FLUSH 100 UNIT/ML IV SOLN
500.0000 [IU] | Freq: Once | INTRAVENOUS | Status: AC
  Administered 2023-12-03: 500 [IU]

## 2023-12-03 MED ORDER — SODIUM CHLORIDE 0.9% FLUSH
10.0000 mL | Freq: Once | INTRAVENOUS | Status: AC
  Administered 2023-12-03: 10 mL

## 2023-12-03 NOTE — Progress Notes (Signed)
 Pt. ANC 0.6 per Dr. [name] no treatment today. Magnesium  1.6 instructed pt. to take Magnesium  as previously described, pt. states she understands.

## 2023-12-03 NOTE — Patient Instructions (Addendum)
 Hypomagnesemia Hypomagnesemia is a condition in which the level of magnesium  in the blood is too low. Magnesium  is a mineral that is found in many foods. It is used in many different processes in the body. Hypomagnesemia can affect every organ in the body. In severe cases, it can cause life-threatening problems. What are the causes? This condition may be caused by: Not getting enough magnesium  in your diet or not having enough healthy foods to eat (malnutrition). Problems with magnesium  absorption in the intestines. Dehydration. Excessive use of alcohol . Vomiting. Severe or long-term (chronic) diarrhea. Some medicines, including medicines that make you urinate more often (diuretics). Certain diseases, such as kidney disease, diabetes, celiac disease, and overactive thyroid . What are the signs or symptoms? Symptoms of this condition include: Loss of appetite, nausea, and vomiting. Involuntary shaking or trembling of a body part (tremor). Muscle weakness or tingling in the arms and legs. Sudden tightening of muscles (muscle spasms). Confusion. Psychiatric issues, such as: Depression and irritability. Psychosis. A feeling of fluttering of the heart (palpitations). Seizures. These symptoms are more severe if magnesium  levels drop suddenly. How is this diagnosed? This condition may be diagnosed based on: Your symptoms and medical history. A physical exam. Blood and urine tests. How is this treated? Treatment depends on the cause and the severity of the condition. It may be treated by: Taking a magnesium  supplement. This can be taken in pill form. If the condition is severe, magnesium  is usually given through an IV. Making changes to your diet. You may be directed to eat foods that have a lot of magnesium , such as green leafy vegetables, peas, beans, and nuts. Not drinking alcohol . If you are struggling not to drink, ask your health care provider for help. Follow these instructions at  home: Eating and drinking     Make sure that your diet includes foods with magnesium . Foods that have a lot of magnesium  in them include: Green leafy vegetables, such as spinach and broccoli. Beans and peas. Nuts and seeds, such as almonds and sunflower seeds. Whole grains, such as whole grain bread and fortified cereals. Drink fluids that contain salts and minerals (electrolytes), such as sports drinks, when you are active. Do not drink alcohol . General instructions Take over-the-counter and prescription medicines only as told by your health care provider. Take magnesium  supplements as directed if your health care provider tells you to take them. Have your magnesium  levels monitored as told by your health care provider. Keep all follow-up visits. This is important. Contact a health care provider if: You get worse instead of better. Your symptoms return. Get help right away if: You develop severe muscle weakness. You have trouble breathing. You feel that your heart is racing. These symptoms may represent a serious problem that is an emergency. Do not wait to see if the symptoms will go away. Get medical help right away. Call your local emergency services (911 in the [place] Do not drive yourself to the hospital. Summary Hypomagnesemia is a condition in which the level of magnesium  in the blood is too low. Hypomagnesemia can affect every organ in the body. Treatment may include eating more foods that contain magnesium , taking magnesium  supplements, and not drinking alcohol . Have your magnesium  levels monitored as told by your health care provider. This information is not intended to replace advice given to you by your health care provider. Make sure you discuss any questions you have with your health care provider. Document Revised: 11/08/2020 Document Reviewed: 11/08/2020 Elsevier Patient Education  2024 [employer]  Neutropenia Neutropenia is a condition that occurs when you  have low levels of neutrophils. Neutrophils are a type of white blood cells. They are made in the spongy center of bones (bone marrow). They fight infections. Neutrophils are your body's main defense against infections. The fewer neutrophils you have and the longer your body remains without them, the greater your risk of getting a severe infection. What are the causes? This condition can occur if your body uses up or destroys neutrophils faster than your bone marrow can make them. Neutropenia may be caused by: A bacterial or fungal infection. Allergic disorders. Reactions to some medicines. An autoimmune disease. An enlarged spleen. This condition can also occur if your bone marrow does not produce enough neutrophils. This problem may be caused by: Cancer. Cancer treatments, such as radiation or chemotherapy. Viral infections. Medicines, such as phenytoin. Vitamin B12 deficiency. Diseases of the bone marrow. Environmental toxins, such as insecticides. What are the signs or symptoms? This condition does not usually cause symptoms. If symptoms are present, they are usually caused by an underlying infection. Symptoms of an infection may include: Fever. Chills. Swollen glands. Mouth ulcers. Cough. Rash or skin infection. Skin may be red, swollen, or painful. Abdominal or rectal pain. Frequent urination or pain or burning with urination. Because neutropenia weakens the immune system, symptoms of infection may be reduced. It is important to be aware of any changes in your body and talk to your health care provider. How is this diagnosed? This condition is diagnosed based on your medical history and a physical exam. Tests will also be done, such as: A complete blood count (CBC). Bone marrow biopsy. This is collecting a sample of bone marrow for testing. A chest X-ray. A urine culture. A blood culture. How is this treated? Treatment depends on the underlying cause and severity of your  condition. Mild neutropenia may not require treatment. Treatment may include medicines, such as: Antibiotic medicine given through an IV. Antiviral medicines. Antifungal medicines. A medicine to increase production of neutrophils (colony-stimulating factor). You may get this medicine through an IV or by injection. Steroids given through an IV. If an underlying condition is causing neutropenia, you may need treatment for that condition. If medicines or cancer treatments are causing neutropenia, your health care provider may have you stop the medicines or treatment. Follow these instructions at home: Medicines  Take over-the-counter and prescription medicines only as told by your health care provider. Get an annual flu shot. Ask your health care provider whether you or anyone you live with needs any other vaccines. Eating and drinking Do not share food utensils. Do not eat unpasteurized foods. Do not eat raw or undercooked meat, eggs, or seafood. Do not eat unwashed, raw fruits or vegetables. Lifestyle Avoid exposure to groups of people or children. Avoid being around people who are sick. Avoid being around live plants or fresh flowers. Avoid being around dirt or dust, such as in construction areas or gardens. Wear gloves if you are going to do yard work or gardening. Do not provide direct care for pets. Avoid animal droppings. Do not clean litter boxes and bird cages. Do not have sex unless your health care provider has approved. Hygiene  Bathe daily. Clean the area between the genitals and the anus (perineal area) after you urinate or have a bowel movement. If you are female, wipe from front to back. Get regular dental care and brush your teeth with a soft toothbrush before and  after meals. Do not use a regular razor. Use an electric razor to remove hair. Wash your hands often with soap and water  for at least 20 seconds. Make sure others who come in contact with you also wash their  hands. If soap and water  are not available, use hand sanitizer. General instructions Take steps to reduce your risk of injury or infection. Follow any precautions as told by your health care provider. Take actions to avoid cuts and burns. For example: Be cautious when you use knives. Always cut away from yourself. Keep knives in protective sheaths or guards when not in use. Use oven mitts when you cook with a hot stove, oven, or grill. Stand a safe distance away from open fires. Do not use tampons, enemas, or rectal suppositories unless your health care provider has approved. Keep all follow-up visits. This is important. Contact a health care provider if: You have a cough. You have a sore throat. You develop sores in your mouth or anus. You have a warm, red, or tender area on your skin. You have red streaks on the skin. You develop a rash. You have swollen lymph nodes. You have frequent or painful urination. You have vaginal discharge or itching. Get help right away if: You have a fever. You have chills or shaking. You have nausea or vomiting. You have a lot of fatigue. You have shortness of breath. Summary Neutropenia is a condition that occurs when you have a lower-than-normal level of a type of white blood cell (neutrophils) in your body. This condition can occur if your body uses up or destroys neutrophils faster than your bone marrow can make them. Treatment depends on the underlying cause and severity of your condition. Mild neutropenia may not require treatment. Follow any precautions as told by your health care provider to reduce your risk for injury or infection. This information is not intended to replace advice given to you by your health care provider. Make sure you discuss any questions you have with your health care provider. Document Revised: 12/07/2020 Document Reviewed: 12/07/2020 Elsevier Patient Education  2024 [employer].

## 2023-12-04 ENCOUNTER — Other Ambulatory Visit: Payer: Self-pay | Admitting: Internal Medicine

## 2023-12-04 ENCOUNTER — Ambulatory Visit: Admitting: Physical Therapy

## 2023-12-04 LAB — T4: T4, Total: 7.2 ug/dL (ref 4.5–12.0)

## 2023-12-05 DIAGNOSIS — C652 Malignant neoplasm of left renal pelvis: Secondary | ICD-10-CM | POA: Diagnosis not present

## 2023-12-05 DIAGNOSIS — C678 Malignant neoplasm of overlapping sites of bladder: Secondary | ICD-10-CM | POA: Diagnosis not present

## 2023-12-09 ENCOUNTER — Ambulatory Visit: Admitting: Physical Therapy

## 2023-12-09 ENCOUNTER — Encounter (HOSPITAL_COMMUNITY): Payer: Self-pay

## 2023-12-09 ENCOUNTER — Other Ambulatory Visit: Payer: Self-pay

## 2023-12-09 ENCOUNTER — Emergency Department (HOSPITAL_COMMUNITY)

## 2023-12-09 ENCOUNTER — Emergency Department (HOSPITAL_COMMUNITY)
Admission: EM | Admit: 2023-12-09 | Discharge: 2023-12-09 | Disposition: A | Discharge status: Home / Self Care | Attending: Emergency Medicine | Admitting: Emergency Medicine

## 2023-12-09 DIAGNOSIS — Z8551 Personal history of malignant neoplasm of bladder: Secondary | ICD-10-CM | POA: Insufficient documentation

## 2023-12-09 DIAGNOSIS — M4807 Spinal stenosis, lumbosacral region: Secondary | ICD-10-CM | POA: Diagnosis not present

## 2023-12-09 DIAGNOSIS — M5459 Other low back pain: Secondary | ICD-10-CM | POA: Diagnosis not present

## 2023-12-09 DIAGNOSIS — M48061 Spinal stenosis, lumbar region without neurogenic claudication: Secondary | ICD-10-CM | POA: Diagnosis not present

## 2023-12-09 DIAGNOSIS — M545 Low back pain, unspecified: Secondary | ICD-10-CM | POA: Insufficient documentation

## 2023-12-09 DIAGNOSIS — M25551 Pain in right hip: Secondary | ICD-10-CM | POA: Diagnosis present

## 2023-12-09 DIAGNOSIS — Z7982 Long term (current) use of aspirin: Secondary | ICD-10-CM | POA: Diagnosis not present

## 2023-12-09 DIAGNOSIS — M47816 Spondylosis without myelopathy or radiculopathy, lumbar region: Secondary | ICD-10-CM | POA: Diagnosis not present

## 2023-12-09 DIAGNOSIS — M5126 Other intervertebral disc displacement, lumbar region: Secondary | ICD-10-CM | POA: Diagnosis not present

## 2023-12-09 MED ORDER — MORPHINE SULFATE (PF) 4 MG/ML IV SOLN
4.0000 mg | Freq: Once | INTRAVENOUS | Status: AC
  Administered 2023-12-09: 4 mg via INTRAVENOUS
  Filled 2023-12-09: qty 1

## 2023-12-09 NOTE — Discharge Instructions (Signed)
 Follow-up with your primary care provider and your orthopedist for further management of your symptoms.  Return to the emergency department if your symptoms worsen.  Continue your pain management regimen as previously directed.  I will provide you with stretches/exercises you can perform for relief of your back pain.  Continue applying warm compresses to the affected area as needed.

## 2023-12-09 NOTE — ED Provider Notes (Signed)
 [place] EMERGENCY DEPARTMENT AT [facility] Provider Note   CSN: [id number] Arrival date & time: 12/09/23  4098     Patient presents with: Hip Pain   Morgan Torres is a 73 y.o. female.   73 year old female presenting with right hip pain.  Patient was seen in this emergency department on 6/8 for same issue, workup including hip/pelvis XR was unremarkable, patient was prescribed Percocet and Robaxin  at time of discharge but reports minimal relief of her symptoms.  Reports worsening right hip/thigh/buttocks pain since then, denies any fall/injury, reports walking with a limp secondary to pain.  She has a diagnosis of bladder cancer with lung metastasis, last received chemotherapy infusion the first week of June, was supposed to have infusion last Tuesday but was told my numbers were too low.  Patient last took pain medication around 8:30 AM, it is unclear which medication she took as she is prescribed Norco, Percocet, and Oxycodone .   Hip Pain       Prior to Admission medications   Medication Sig Start Date End Date Taking? Authorizing Provider  acetaminophen  (TYLENOL ) 500 MG tablet Take 1 tablet (500 mg total) by mouth every 6 (six) hours as needed for mild pain. 10/14/22   Rising, Ivette Marks, PA-C  albuterol  (PROVENTIL  HFA;VENTOLIN  HFA) 108 (90 Base) MCG/ACT inhaler Inhale 2 puffs into the lungs every 6 (six) hours as needed for wheezing or shortness of breath. 05/10/16   Carlee Charters, NP  amLODipine  (NORVASC ) 5 MG tablet Take 5 mg by mouth daily. Prescribed by [facility]    [provider]  aspirin  EC 81 MG tablet Take 81 mg by mouth daily. Swallow whole.    [provider]  benzonatate  (TESSALON ) 100 MG capsule Take 100 mg by mouth 3 (three) times daily as needed for cough. 12/18/22   Wilburn Handler, MD  calcium  acetate (PHOSLO ) 667 MG tablet Take 667 mg by mouth 3 (three) times daily with meals.    [provider]  cilostazol  (PLETAL ) 50 MG tablet  Take 50 mg by mouth 2 (two) times daily.    [provider]  clotrimazole  (LOTRIMIN ) 1 % external solution Apply topically 2 (two) times daily. 07/20/23   Regalado, Belkys A, MD  dexamethasone  (DECADRON ) 4 MG tablet Take 2 tablets (8 mg) daily for 3 days. Start the day after chemotherapy. Patient not taking: Reported on 11/05/2023 10/29/23   Marlene Simas, MD  EPINEPHrine  (EPIPEN  2-PAK) 0.3 mg/0.3 mL DEVI Inject 0.3 mLs (0.3 mg total) into the muscle once as needed (for severe allergic reaction). CAll 911 immediately if you have to use this medicine Patient taking differently: Inject 0.3 mg into the muscle once as needed (for a severe allergic reaction- CALL 9-1-1 IMMEDIATELY IF USED). 11/26/12   Piepenbrink, Bridgette Campus, PA-C  gabapentin  (NEURONTIN ) 300 MG capsule Take 300 mg by mouth at bedtime. 09/24/23   [provider]  guaiFENesin  (MUCINEX ) 600 MG 12 hr tablet Take 2 tablets (1,200 mg total) by mouth 2 (two) times daily. Patient taking differently: Take 1,200 mg by mouth 2 (two) times daily as needed for to loosen phlegm. 07/20/23   Regalado, Belkys A, MD  HYDROcodone-acetaminophen  (NORCO) 7.5-325 MG tablet Take 1 tablet by mouth. 08/27/23   [provider]  HYDROcodone-acetaminophen  (NORCO/VICODIN) 5-325 MG tablet Take 1 tablet by mouth. 11/12/22   [provider]  ipratropium (ATROVENT ) 0.06 % nasal spray Place 2 sprays into the nose. 09/24/23   [provider]  Lanolin Alcohol  WAX Apply  topically. 09/24/23 12/23/23  [provider]  levothyroxine  (SYNTHROID ) 100 MCG tablet Take 1 tablet (100 mcg total) by mouth daily before breakfast. 11/12/23   Marlene Simas, MD  lidocaine -prilocaine  (EMLA ) cream Apply 1 Application topically as needed. 10/30/23   Heilingoetter, Cassandra L, PA-C  loperamide  (IMODIUM ) 2 MG capsule Take 2 tabs by mouth with first loose stool, then 1 tab with each additional loose stool as needed. Do not exceed 8 tabs in a 24-hour  period Patient not taking: Reported on 11/05/2023 10/29/23   Marlene Simas, MD  LORazepam  (ATIVAN ) 0.5 MG tablet Take 0.5 mg by mouth daily as needed for anxiety. 07/12/23   [provider]  magnesium  oxide (MAG-OX) 400 (240 Mg) MG tablet TAKE 1 TABLET BY MOUTH EVERY DAY 11/27/23   Heilingoetter, Cassandra L, PA-C  methocarbamol  (ROBAXIN ) 500 MG tablet Take 1 tablet (500 mg total) by mouth 2 (two) times daily. 12/01/23   Lind Repine, MD  metoprolol  tartrate (LOPRESSOR ) 25 MG tablet Take 1 tablet by mouth 2 (two) times daily. 09/24/23   [provider]  MILK OF MAGNESIA 400 MG/5ML suspension Take 15-30 mLs by mouth daily as needed for mild constipation.    [provider]  montelukast  (SINGULAIR ) 10 MG tablet Take 10 mg by mouth.    [provider]  naloxone  (NARCAN ) 2 MG/2ML injection as needed for opioid reversal. Spray the contents of 1 device into 1 nostril. Call 911. May repeat with 2nd device in alternate nostril if no response in 2-3 minutes. Patient not taking: Reported on 11/05/2023 09/24/23 12/23/23  [provider]  nicotine  (NICODERM CQ  - DOSED IN MG/24 HOURS) 21 mg/24hr patch Place 21 mg onto the skin daily.    [provider]  nicotine  polacrilex (NICORETTE ) 4 MG gum Take 4 mg by mouth as needed for smoking cessation.    [provider]  nitroGLYCERIN  (NITROSTAT ) 0.4 MG SL tablet Place 1 tablet (0.4 mg total) under the tongue every 5 (five) minutes x 3 doses as needed for chest pain. 09/25/18   Hilty, Aviva Lemmings, MD  ondansetron  (ZOFRAN ) 8 MG tablet Take 1 tablet (8 mg total) by mouth every 8 (eight) hours as needed for nausea or vomiting. Start on the third day after chemotherapy. 10/29/23   Marlene Simas, MD  oxyCODONE  (OXY IR/ROXICODONE ) 5 MG immediate release tablet Take 5 mg by mouth 3 (three) times daily as needed. 09/24/23   [provider]  oxyCODONE -acetaminophen  (PERCOCET/ROXICET) 5-325 MG tablet Take 1 tablet by mouth  every 6 (six) hours as needed for severe pain (pain score 7-10). 12/01/23   Lind Repine, MD  Potassium & Sodium Phosphates (PHOSPHORUS  W/SOD & POTASSIUM) 280-160-250 MG PACK TAKE 1 PACKAGE BY MOUTH 2 (TWO) TIMES DAILY. 12/04/23   Marlene Simas, MD  prochlorperazine  (COMPAZINE ) 10 MG tablet Take 1 tablet (10 mg total) by mouth every 6 (six) hours as needed. 11/06/23   Heilingoetter, Cassandra L, PA-C  sodium chloride  1 g tablet TAKE 1 TABLET (1 G TOTAL) BY MOUTH DAILY. SPLIT 1 GRAM INTO TWO HALVES AND TAKE BY MOUTH ONCE A DAY 12/02/23   Heilingoetter, Cassandra L, PA-C  temazepam  (RESTORIL ) 15 MG capsule Take 1 capsule (15 mg total) by mouth at bedtime as needed for sleep. 02/06/23   Heilingoetter, Cassandra L, PA-C  traMADol  (ULTRAM ) 50 MG tablet Take 50 mg by mouth every 6 (six) hours as needed (for pain).    [provider]    Allergies: Diclofenac, Padcev  [enfortumab vedotin ], Ciprofloxacin,  Codeine, and Hydrocodone    Review of Systems  Updated Vital Signs BP (!) 126/51   Pulse 65   Temp 98.5 F (36.9 C) (Oral)   Resp 16   Ht 5' 8 (1.727 m)   Wt 75.8 kg   SpO2 98%   BMI 25.39 kg/m   Physical Exam Vitals and nursing note reviewed.  HENT:     Head: Normocephalic.   Eyes:     Extraocular Movements: Extraocular movements intact.    Cardiovascular:     Rate and Rhythm: Normal rate and regular rhythm.  Pulmonary:     Effort: Pulmonary effort is normal.     Breath sounds: Normal breath sounds.   Musculoskeletal:     Comments: No bony tenderness to spinous processes of L/T/C spine Tenderness to palpation of R buttocks, hip, femur 5/5 strength against resistance of bilateral lower extremities   Neurological:     Mental Status: She is alert and oriented to person, place, and time.     Sensory: No sensory deficit.     Motor: No weakness.     (all labs ordered are listed, but only abnormal results are displayed) Labs Reviewed - No data to  display  EKG: None  Radiology: CT Lumbar Spine Wo Contrast Result Date: 12/09/2023 CLINICAL DATA:  Pain, known malignancy. Right hip pain moving down leg for 10 days. EXAM: CT LUMBAR SPINE WITHOUT CONTRAST TECHNIQUE: Multidetector CT imaging of the lumbar spine was performed without intravenous contrast administration. Multiplanar CT image reconstructions were also generated. RADIATION DOSE REDUCTION: This exam was performed according to the departmental dose-optimization program which includes automated exposure control, adjustment of the mA and/or kV according to patient size and/or use of iterative reconstruction technique. COMPARISON:  MRI lumbar spine 10/03/2022. FINDINGS: Segmentation: 5 lumbar type vertebrae. Alignment: Straightening of the normal lumbar lordosis. Trace retrolisthesis of L2 on L3. Vertebrae: Posterior instrumented fusion at L5-S1 with bilateral pedicle screws and vertical interlocking rods. The left-sided L5 pedicle screw traverses the superior endplate and partially enters the L4-5 disc space. Interbody spacer at L5-S1 with mature osseous fusion at this level. Hardware is intact. Significant degenerative endplate changes and sclerosis at L2-3 which is not significantly changed since 2004. Additional sclerosis along the anterior inferior aspect of L1 and L4 likely related to additional degenerative changes. No destructive osseous lesion. Sequelae of decompressive laminectomy at L5-S1. Degenerative changes of the sacroiliac joints with partial ankylosis of the right SI joint. Paraspinal and other soft tissues: The paraspinal soft tissues are unremarkable. Extensive atherosclerosis of the abdominal aorta and branch vessels. Sequelae of left nephrectomy. Disc levels: T12-L1: No significant disc bulge. No significant spinal canal stenosis or foraminal stenosis. L1-2: Small disc bulge and left foraminal disc protrusion. Mild facet arthrosis. No significant spinal canal stenosis. Mild  bilateral foraminal stenosis. L2-3: Moderate to severe disc space narrowing with vacuum disc phenomenon. Diffuse disc bulge and posterior osteophytes resulting in lateral recess narrowing. Bilateral facet arthrosis and thickening of the ligamentum flavum. Mild-to-moderate spinal canal stenosis. There is moderate right and mild left foraminal stenosis. L3-4: Diffuse disc bulge and posterior osteophytes likely with lateral recess narrowing. Bilateral facet arthrosis and thickening of the ligamentum flavum. Moderate spinal canal stenosis. There is moderate bilateral foraminal stenosis. L4-5: Diffuse disc bulge with likely lateral recess narrowing. Moderate facet arthrosis and thickening of the ligamentum flavum. Moderate to severe spinal canal stenosis. There is moderate bilateral foraminal stenosis slightly greater on the right. L5-S1: Postsurgical changes. No high-grade osseous spinal  canal stenosis. Bilateral facet arthrosis along with disc bulge and posterior osteophytes contributing to severe right and mild-to-moderate left foraminal stenosis. IMPRESSION: No acute abnormality of the lumbar spine. Prominent sclerosis at multiple lumbar vertebral bodies particularly at L2-3 likely degenerative in etiology and not significantly changed since 2024. No destructive osseous lesion. Decompressive laminectomy and posterior interbody fusion at L5-S1. Left L5 pedicle screw traverses the superior endplate and partially involves the L4-5 disc space. Degenerative changes as above. Moderate to severe spinal canal stenosis at L4-5 and moderate spinal canal stenosis at L3-4. Lateral recess narrowing at multiple levels. Multilevel foraminal stenosis most pronounced on the right at L5-S1. Electronically Signed   By: Denny Flack M.D.   On: 12/09/2023 13:16   CT PELVIS WO CONTRAST Result Date: 12/09/2023 CLINICAL DATA:  Pelvic pain, history of bladder neoplasm. EXAM: CT PELVIS WITHOUT CONTRAST TECHNIQUE: Multidetector CT imaging  of the pelvis was performed following the standard protocol without intravenous contrast. RADIATION DOSE REDUCTION: This exam was performed according to the departmental dose-optimization program which includes automated exposure control, adjustment of the mA and/or kV according to patient size and/or use of iterative reconstruction technique. COMPARISON:  CT pelvis and abdomen January 31, 2023 FINDINGS: Urinary Tract: Collapsed bladder with a air-fluid level related to recent instrumentation. Bowel:  Insert 1 Vascular/Lymphatic: No pathologically enlarged lymph nodes. No significant vascular abnormality seen. Reproductive:  No mass or other significant abnormality Other:  None. Musculoskeletal: No suspicious bone lesions identified. Prior L5-S1 transpedicular fixation with expansile laminectomy with significant degenerative hypertrophic osteoarthritic facet changes Total left hip arthroplasty IMPRESSION: *No acute findings. *Collapsed bladder with a air-fluid level related to recent instrumentation. *Prior L5-S1 transpedicular fixation with expansile laminectomy with significant degenerative hypertrophic osteoarthritic facet changes. Electronically Signed   By: Fredrich Jefferson M.D.   On: 12/09/2023 13:14     Procedures   Medications Ordered in the ED - No data to display                                  Medical Decision Making This patient presents to the ED for concern of hip/buttocks/thigh pain, this involves an extensive number of treatment options, and is a complaint that carries with it a high risk of complications and morbidity.  The differential diagnosis includes sciatica, fracture, malignancy, other musculoskeletal pain/strain/sprain.    Co morbidities that complicate the patient evaluation  Bladder cancer with lung mets   Additional history obtained:  Additional history obtained from record review External records from outside source obtained and reviewed including recent ED  notes    Imaging Studies ordered:  I ordered imaging studies including CT pelvis and CT L spine I independently visualized and interpreted imaging which showed  - CT pelvis: *No acute findings. *Collapsed bladder with a air-fluid level related to recent instrumentation. *Prior L5-S1 transpedicular fixation with expansile laminectomy with significant degenerative hypertrophic osteoarthritic facet changes. - CT L spine: No acute abnormality of the lumbar spine. Prominent sclerosis at multiple lumbar vertebral bodies particularly at L2-3 likely degenerative in etiology and not significantly changed since 2024. No destructive osseous lesion. Decompressive laminectomy and posterior interbody fusion at L5-S1. Left L5 pedicle screw traverses the superior endplate and partially involves the L4-5 disc space. Degenerative changes as above. Moderate to severe spinal canal stenosis at L4-5 and moderate spinal canal stenosis at L3-4. Lateral recess narrowing at multiple levels. Multilevel foraminal stenosis most pronounced on the right  at L5-S1.   I agree with the radiologist interpretation   Cardiac Monitoring: / EKG:  The patient was maintained on a cardiac monitor.  I personally viewed and interpreted the cardiac monitored which showed an underlying rhythm of: NSR   Problem List / ED Course / Critical interventions / Medication management   I ordered medication including morphine   for pain  Reevaluation of the patient after these medicines showed that the patient improved I have reviewed the patients home medicines and have made adjustments as needed   Social Determinants of Health:  Tobacco use    Test / Admission - Considered:  Physical exam is notable as above, no red flag back pain symptoms, no lower extremity weakness or sensory deficits noted on my physical exam.  I did elect to proceed with CT imaging of her pelvis/back given patient's known malignancy with lung metastasis and my  concern for possible bony mets, see above for interpretation.  Symptoms are likely attributed to sciatica, imaging is reassuring as above, patient has chronic low back pain with history of prior back surgery.  Patient is already on extensive pain management regimen at home, at this time I do not feel it would be appropriate to add additional pain medications to her regimen.  Discussed exercises/stretching she can do for relief of her symptoms, continue heat/warm compresses applied to the affected area.  Patient is established with a primary care provider and an orthopedist, advised her to follow-up with these providers for further management of her symptoms.  She voiced understanding and is in agreement with this plan.  Return precautions discussed.  Patient is appropriate discharge at this time.    Amount and/or Complexity of Data Reviewed Radiology: ordered.  Risk Prescription drug management.        Final diagnoses:  Right low back pain, unspecified chronicity, unspecified whether sciatica present    ED Discharge Orders     None          Adolm Ahumada 12/09/23 1352    Trish Furl, MD 12/10/23 1017

## 2023-12-09 NOTE — ED Notes (Addendum)
 Pt c/o R low back, hip, thigh pain.  Denies injury.  Pt believes it could be related to tumors she has in her bladder.  Pt reports she woke up with the pain x 10days ago.  Pt seen 6/8 for same.  Hx of sciatica and bladder CA.

## 2023-12-09 NOTE — ED Notes (Signed)
 ED Provider at bedside.

## 2023-12-09 NOTE — ED Notes (Signed)
 Delay in IV meds d/t need for USGPIV insertion. Pt did not want her port accessed.

## 2023-12-09 NOTE — ED Triage Notes (Addendum)
 Patient has right hip pain that moves down her leg for 10 days. No falls. Has tumors in her bladder and lung but is not doing chemo currently.

## 2023-12-09 NOTE — ED Provider Notes (Incomplete)
 I provided a substantive portion of the care of this patient.  I personally made/approved the management plan for this patient and take responsibility for the patient management. {Remember to document shared critical care using "edcritical" dot phrase:1}

## 2023-12-10 ENCOUNTER — Telehealth: Payer: Self-pay | Admitting: Internal Medicine

## 2023-12-10 ENCOUNTER — Other Ambulatory Visit: Payer: Self-pay | Admitting: Urology

## 2023-12-10 NOTE — Telephone Encounter (Signed)
I left a message for the patient to call our office to schedule a tele visit for pre-op 

## 2023-12-10 NOTE — Telephone Encounter (Signed)
   Pre-operative Risk Assessment    Patient Name: Morgan Torres  DOB: [birth year] MRN: [id number]   Date of last office visit: 01/26/21 Date of next office visit: 01/24/24   Request for Surgical Clearance    Procedure:  transurethral resection of bladder tumor  Date of Surgery:  Clearance 12/25/23                                Surgeon:  Dr. Sherrine Dolly Surgeon's Group or Practice Name:  [facility] Phone number:  [phone] Fax number:  [phone]   Type of Clearance Requested:   - Medical  - Pharmacy:  Hold Aspirin  5 days before    Type of Anesthesia:  General    Additional requests/questions:     SignedCaryn Clause   12/10/2023, 9:02 AM

## 2023-12-10 NOTE — Telephone Encounter (Signed)
   Name: Morgan Torres  DOB: 03-Aug-1950  MRN: [id number]  Primary Cardiologist: Hazle Lites, MD  Chart reviewed as part of pre-operative protocol coverage. Because of Morgan Torres's past medical history and time since last visit, she will require a follow-up in-office visit in order to better assess preoperative cardiovascular risk.  Patient has an office visit scheduled on 01/24/2024 with Dr. Maximo Spar.  She is scheduled for transurethral resection of bladder tumor on 12/25/2023.  Her office visit will need to be moved up.  She has not been seen since August 2022.  Pre-op covering staff:  - Please contact requesting surgeon's office via preferred method (i.e, phone, fax) to inform them of need for appointment prior to surgery.   Morey Ar, NP  12/10/2023, 4:07 PM

## 2023-12-11 ENCOUNTER — Ambulatory Visit: Admitting: Physical Therapy

## 2023-12-12 DIAGNOSIS — M25551 Pain in right hip: Secondary | ICD-10-CM | POA: Diagnosis not present

## 2023-12-12 NOTE — Progress Notes (Unsigned)
 [facility] OFFICE PROGRESS NOTE  Wilburn Handler, MD [address] [place] [address]  DIAGNOSIS: Metastatic urothelial carcinoma that was initially diagnosed as stage II (T2, N0, M0) in September 2022 status post right nephroureterectomy as well as intravesical treatment and resection of superficial tumor in the bladder several times and March 2023 as well as July 2023.  The patient was found to have enlarging and new pulmonary nodules consistent with metastatic disease in November 2023.   PRIOR THERAPY:  1) Right nephroureterectomy as well as intravesical treatment and resection of superficial tumor in the bladder several times and March 2023 as well as July 2023.  2) Palliative systemic chemotherapy with carboplatin  for AUC of 5 on day 1 and gemcitabine  1000 mg/M2 on days 1 and 8 every 3 weeks. First cycle June 21, 2022. Status post 6 cycles. Her dose of carboplatin  was reduced to AUC of 4 and gemcitabine  to 800 mg/m2 starting from cycle #5 due to cytopenias. 3) Maintenance immunotherapy with Avelumab  800 mg IV every 2 weeks. First dose on 11/15/22. Status post 6 cycle of treatment. 4) Erdafitinib  (balversa ) 8 mg p.o daily. First dose 02/22/23. Status post 4 months of treatment. Discontinued in February 2025 due to disease progression.  5) Enfortumab vedotin  infusion therapy: once a week for three weeks, followed by one week off, repeated every four weeks. First dose was on 08/08/2023.  Status post 1 cycle.  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 First dose 11/05/2023.Status post 2 cycles of treatment. Day 8 cycle #2 was held due   INTERVAL HISTORY: Morgan Torres 73 y.o. female returns to the clinic today for a follow up visit. The patient was last seen by Dr.  [name] on 11/26/23. She is currently undergoing treatment with sacituzumab govitecan . She had some cytopenias.    Scheduld for TURBT on 12/25/23  Er on 6/8 and 6/18 for low back pain.  hip/pelvis XR was unremarkable, patient was prescribed Percocet and Robaxin  at time of discharge but reports minimal relief of her symptoms. Reports worsening right hip/thigh/buttocks pain since then, denies any fall/injury, reports walking with a limp secondary to pain. She does have history of low back surgery. They felt symptoms related to sciatica. She is established with PCP and ortho and they recommended follow up.   MEDICAL HISTORY: Past Medical History:  Diagnosis Date   Anxiety    Arthritis    hip, lumbar spine    Asthma    seasonal allergies    Bronchitis    Hx: of   Chronic kidney disease    COPD (chronic obstructive pulmonary disease) (HCC)    Coronary artery disease    Depression    Fibromyalgia    Hypertension    Lumbar herniated disc    Metastatic urothelial carcinoma (HCC)    Myocardial infarction (HCC) 06/25/2012   followed by Dr. Maximo Spar, treated medically, no stents   Peripheral arterial disease (HCC)    of right foot   Pneumonia    Hx: of yrs ago   Sciatica    Sleep apnea    Small bowel obstruction (HCC)    several yrs ago    ALLERGIES:  is allergic to diclofenac, padcev  [enfortumab vedotin ], ciprofloxacin, codeine, and hydrocodone.  MEDICATIONS:  Current Outpatient  Medications  Medication Sig Dispense Refill   acetaminophen  (TYLENOL ) 500 MG tablet Take 1 tablet (500 mg total) by mouth every 6 (six) hours as needed for mild pain. 30 tablet 0   albuterol  (PROVENTIL  HFA;VENTOLIN  HFA) 108 (90 Base) MCG/ACT inhaler Inhale 2 puffs into the lungs every 6 (six) hours as needed for wheezing or shortness of breath. 1 Inhaler 0   amLODipine  (NORVASC ) 5 MG tablet Take 5 mg by mouth daily. Prescribed by [facility]     aspirin  EC 81 MG tablet Take 81 mg by mouth daily. Swallow whole.      benzonatate  (TESSALON ) 100 MG capsule Take 100 mg by mouth 3 (three) times daily as needed for cough.     calcium  acetate (PHOSLO ) 667 MG tablet Take 667 mg by mouth 3 (three) times daily with meals.     cilostazol  (PLETAL ) 50 MG tablet Take 50 mg by mouth 2 (two) times daily.     clotrimazole  (LOTRIMIN ) 1 % external solution Apply topically 2 (two) times daily. 30 mL 0   dexamethasone  (DECADRON ) 4 MG tablet Take 2 tablets (8 mg) daily for 3 days. Start the day after chemotherapy. (Patient not taking: Reported on 11/05/2023) 30 tablet 1   EPINEPHrine  (EPIPEN  2-PAK) 0.3 mg/0.3 mL DEVI Inject 0.3 mLs (0.3 mg total) into the muscle once as needed (for severe allergic reaction). CAll 911 immediately if you have to use this medicine (Patient taking differently: Inject 0.3 mg into the muscle once as needed (for a severe allergic reaction- CALL 9-1-1 IMMEDIATELY IF USED).) 1 Device 1   gabapentin  (NEURONTIN ) 300 MG capsule Take 300 mg by mouth at bedtime.     guaiFENesin  (MUCINEX ) 600 MG 12 hr tablet Take 2 tablets (1,200 mg total) by mouth 2 (two) times daily. (Patient taking differently: Take 1,200 mg by mouth 2 (two) times daily as needed for to loosen phlegm.) 120 tablet 0   HYDROcodone-acetaminophen  (NORCO) 7.5-325 MG tablet Take 1 tablet by mouth.     HYDROcodone-acetaminophen  (NORCO/VICODIN) 5-325 MG tablet Take 1 tablet by mouth.     ipratropium (ATROVENT ) 0.06 % nasal spray Place 2 sprays into the nose.     Lanolin Alcohol  WAX Apply topically.     levothyroxine  (SYNTHROID ) 100 MCG tablet Take 1 tablet (100 mcg total) by mouth daily before breakfast. 30 tablet 1   lidocaine -prilocaine  (EMLA ) cream Apply 1 Application topically as needed. 30 g 2   loperamide  (IMODIUM ) 2 MG capsule Take 2 tabs by mouth with first loose stool, then 1 tab with each additional loose stool as needed. Do not exceed 8 tabs in a 24-hour period (Patient not taking: Reported on 11/05/2023) 60 capsule 3   LORazepam  (ATIVAN ) 0.5 MG  tablet Take 0.5 mg by mouth daily as needed for anxiety.     magnesium  oxide (MAG-OX) 400 (240 Mg) MG tablet TAKE 1 TABLET BY MOUTH EVERY DAY 90 tablet 1   methocarbamol  (ROBAXIN ) 500 MG tablet Take 1 tablet (500 mg total) by mouth 2 (two) times daily. 20 tablet 0   metoprolol  tartrate (LOPRESSOR ) 25 MG tablet Take 1 tablet by mouth 2 (two) times daily.     MILK OF MAGNESIA 400 MG/5ML suspension Take 15-30 mLs by mouth daily as needed for mild constipation.     montelukast  (SINGULAIR ) 10 MG tablet Take 10 mg by mouth.     naloxone  (NARCAN ) 2 MG/2ML injection as needed for opioid reversal. Spray the contents of 1 device into 1 nostril. Call 911. May repeat  with 2nd device in alternate nostril if no response in 2-3 minutes. (Patient not taking: Reported on 11/05/2023)     nicotine  (NICODERM CQ  - DOSED IN MG/24 HOURS) 21 mg/24hr patch Place 21 mg onto the skin daily.     nicotine  polacrilex (NICORETTE ) 4 MG gum Take 4 mg by mouth as needed for smoking cessation.     nitroGLYCERIN  (NITROSTAT ) 0.4 MG SL tablet Place 1 tablet (0.4 mg total) under the tongue every 5 (five) minutes x 3 doses as needed for chest pain. 25 tablet 2   ondansetron  (ZOFRAN ) 8 MG tablet Take 1 tablet (8 mg total) by mouth every 8 (eight) hours as needed for nausea or vomiting. Start on the third day after chemotherapy. 30 tablet 1   oxyCODONE  (OXY IR/ROXICODONE ) 5 MG immediate release tablet Take 5 mg by mouth 3 (three) times daily as needed.     oxyCODONE -acetaminophen  (PERCOCET/ROXICET) 5-325 MG tablet Take 1 tablet by mouth every 6 (six) hours as needed for severe pain (pain score 7-10). 15 tablet 0   Potassium & Sodium Phosphates (PHOSPHORUS  W/SOD & POTASSIUM) 280-160-250 MG PACK TAKE 1 PACKAGE BY MOUTH 2 (TWO) TIMES DAILY. 30 each 1   prochlorperazine  (COMPAZINE ) 10 MG tablet Take 1 tablet (10 mg total) by mouth every 6 (six) hours as needed. 30 tablet 2   sodium chloride  1 g tablet TAKE 1 TABLET (1 G TOTAL) BY MOUTH DAILY.  SPLIT 1 GRAM INTO TWO HALVES AND TAKE BY MOUTH ONCE A DAY 30 tablet 0   temazepam  (RESTORIL ) 15 MG capsule Take 1 capsule (15 mg total) by mouth at bedtime as needed for sleep. 30 capsule 0   traMADol  (ULTRAM ) 50 MG tablet Take 50 mg by mouth every 6 (six) hours as needed (for pain).     No current facility-administered medications for this visit.    SURGICAL HISTORY:  Past Surgical History:  Procedure Laterality Date   ABDOMINAL SURGERY     resection of small intestine   ANTERIOR CRUCIATE LIGAMENT REPAIR Bilateral    BACK SURGERY     fusion of lumbar   CARDIAC CATHETERIZATION  06/25/2012   COLON SURGERY     resection for bowel - for obstruction  6 to 7 yrs ago per pt on 01-05-2022   COLONOSCOPY     Hx; of   DILATION AND CURETTAGE OF UTERUS     EYE SURGERY Bilateral    cataracts removed   HERNIA REPAIR  02/23/1969   inguinal hernia    IR IMAGING GUIDED PORT INSERTION  06/07/2022   LEFT HEART CATHETERIZATION WITH CORONARY ANGIOGRAM N/A 03/30/2013   Procedure: LEFT HEART CATHETERIZATION WITH CORONARY ANGIOGRAM;  Surgeon: Arleen Lacer, MD;  Location: [facility] CATH LAB;  Service: Cardiovascular;  Laterality: N/A;   left thumb surgery     yrs ago   ROBOT ASSITED LAPAROSCOPIC NEPHROURETERECTOMY Left 03/09/2021   Procedure: XI ROBOT ASSITED LAPAROSCOPIC NEPHROURETERECTOMY/ CYSTOSCOPY WITH LEFT URETEROSCOPY WITH TRANSURETHRAL RESECTION OF URETERAL ORIFICE;  Surgeon: Adelbert Homans, MD;  Location: [facility] ORS;  Service: Urology;  Laterality: Left;   SALIVARY GLAND SURGERY Left    approached from inside mouth & side of neck 1970-1980   TONSILLECTOMY     as child   TOTAL HIP ARTHROPLASTY Left 05/28/2014   Procedure: LEFT TOTAL HIP ARTHROPLASTY;  Surgeon: Forbes Ida., MD;  Location: [facility] OR;  Service: Orthopedics;  Laterality: Left;   TRANSURETHRAL RESECTION OF BLADDER TUMOR N/A 08/23/2021   Procedure: TRANSURETHRAL RESECTION OF  BLADDER TUMOR (TURBT) WITH CYSTOSCOPY/ POSTOPERATIVE  INSTILLATION OF GEMCITABINE /retrograde pyelogram and stent placement (right);  Surgeon: Adelbert Homans, MD;  Location: [facility];  Service: Urology;  Laterality: N/A;   TRANSURETHRAL RESECTION OF BLADDER TUMOR N/A 01/10/2022   Procedure: TRANSURETHRAL RESECTION OF BLADDER TUMOR (TURBT) WITH CYSTOSCOPY / GEMCITABINE  INSTILLATION post-operatively;  Surgeon: Adelbert Homans, MD;  Location: [facility];  Service: Urology;  Laterality: N/A;  ONLY NEEDS 30 MIN    REVIEW OF SYSTEMS:   Review of Systems  Constitutional: Negative for appetite change, chills, fatigue, fever and unexpected weight change.  HENT:   Negative for mouth sores, nosebleeds, sore throat and trouble swallowing.   Eyes: Negative for eye problems and icterus.  Respiratory: Negative for cough, hemoptysis, shortness of breath and wheezing.   Cardiovascular: Negative for chest pain and leg swelling.  Gastrointestinal: Negative for abdominal pain, constipation, diarrhea, nausea and vomiting.  Genitourinary: Negative for bladder incontinence, difficulty urinating, dysuria, frequency and hematuria.   Musculoskeletal: Negative for back pain, gait problem, neck pain and neck stiffness.  Skin: Negative for itching and rash.  Neurological: Negative for dizziness, extremity weakness, gait problem, headaches, light-headedness and seizures.  Hematological: Negative for adenopathy. Does not bruise/bleed easily.  Psychiatric/Behavioral: Negative for confusion, depression and sleep disturbance. The patient is not nervous/anxious.     PHYSICAL EXAMINATION:  There were no vitals taken for this visit.  ECOG PERFORMANCE STATUS: {CHL ONC ECOG [id number]  Physical Exam  Constitutional: Oriented to person, place, and time and well-developed, well-nourished, and in no distress. No distress.  HENT:  Head: Normocephalic and atraumatic.  Mouth/Throat: Oropharynx is clear and moist. No  oropharyngeal exudate.  Eyes: Conjunctivae are normal. Right eye exhibits no discharge. Left eye exhibits no discharge. No scleral icterus.  Neck: Normal range of motion. Neck supple.  Cardiovascular: Normal rate, regular rhythm, normal heart sounds and intact distal pulses.   Pulmonary/Chest: Effort normal and breath sounds normal. No respiratory distress. No wheezes. No rales.  Abdominal: Soft. Bowel sounds are normal. Exhibits no distension and no mass. There is no tenderness.  Musculoskeletal: Normal range of motion. Exhibits no edema.  Lymphadenopathy:    No cervical adenopathy.  Neurological: Alert and oriented to person, place, and time. Exhibits normal muscle tone. Gait normal. Coordination normal.  Skin: Skin is warm and dry. No rash noted. Not diaphoretic. No erythema. No pallor.  Psychiatric: Mood, memory and judgment normal.  Vitals reviewed.  LABORATORY DATA: Lab Results  Component Value Date   WBC 3.3 (L) 12/03/2023   HGB 11.2 (L) 12/03/2023   HCT 33.8 (L) 12/03/2023   MCV 97.1 12/03/2023   PLT 375 12/03/2023      Chemistry      Component Value Date/Time   NA 134 (L) 12/03/2023 0808   K 4.8 12/03/2023 0808   CL 100 12/03/2023 0808   CO2 27 12/03/2023 0808   BUN 25 (H) 12/03/2023 0808   CREATININE 1.43 (H) 12/03/2023 0808      Component Value Date/Time   CALCIUM  8.7 (L) 12/03/2023 0808   ALKPHOS 71 12/03/2023 0808   AST 14 (L) 12/03/2023 0808   ALT 18 12/03/2023 0808   BILITOT 0.3 12/03/2023 0808       RADIOGRAPHIC STUDIES:  CT Lumbar Spine Wo Contrast Result Date: 12/09/2023 CLINICAL DATA:  Pain, known malignancy. Right hip pain moving down leg for 10 days. EXAM: CT LUMBAR SPINE WITHOUT CONTRAST TECHNIQUE: Multidetector CT imaging of the lumbar spine was performed  without intravenous contrast administration. Multiplanar CT image reconstructions were also generated. RADIATION DOSE REDUCTION: This exam was performed according to the departmental  dose-optimization program which includes automated exposure control, adjustment of the mA and/or kV according to patient size and/or use of iterative reconstruction technique. COMPARISON:  MRI lumbar spine 10/03/2022. FINDINGS: Segmentation: 5 lumbar type vertebrae. Alignment: Straightening of the normal lumbar lordosis. Trace retrolisthesis of L2 on L3. Vertebrae: Posterior instrumented fusion at L5-S1 with bilateral pedicle screws and vertical interlocking rods. The left-sided L5 pedicle screw traverses the superior endplate and partially enters the L4-5 disc space. Interbody spacer at L5-S1 with mature osseous fusion at this level. Hardware is intact. Significant degenerative endplate changes and sclerosis at L2-3 which is not significantly changed since 2004. Additional sclerosis along the anterior inferior aspect of L1 and L4 likely related to additional degenerative changes. No destructive osseous lesion. Sequelae of decompressive laminectomy at L5-S1. Degenerative changes of the sacroiliac joints with partial ankylosis of the right SI joint. Paraspinal and other soft tissues: The paraspinal soft tissues are unremarkable. Extensive atherosclerosis of the abdominal aorta and branch vessels. Sequelae of left nephrectomy. Disc levels: T12-L1: No significant disc bulge. No significant spinal canal stenosis or foraminal stenosis. L1-2: Small disc bulge and left foraminal disc protrusion. Mild facet arthrosis. No significant spinal canal stenosis. Mild bilateral foraminal stenosis. L2-3: Moderate to severe disc space narrowing with vacuum disc phenomenon. Diffuse disc bulge and posterior osteophytes resulting in lateral recess narrowing. Bilateral facet arthrosis and thickening of the ligamentum flavum. Mild-to-moderate spinal canal stenosis. There is moderate right and mild left foraminal stenosis. L3-4: Diffuse disc bulge and posterior osteophytes likely with lateral recess narrowing. Bilateral facet arthrosis and  thickening of the ligamentum flavum. Moderate spinal canal stenosis. There is moderate bilateral foraminal stenosis. L4-5: Diffuse disc bulge with likely lateral recess narrowing. Moderate facet arthrosis and thickening of the ligamentum flavum. Moderate to severe spinal canal stenosis. There is moderate bilateral foraminal stenosis slightly greater on the right. L5-S1: Postsurgical changes. No high-grade osseous spinal canal stenosis. Bilateral facet arthrosis along with disc bulge and posterior osteophytes contributing to severe right and mild-to-moderate left foraminal stenosis. IMPRESSION: No acute abnormality of the lumbar spine. Prominent sclerosis at multiple lumbar vertebral bodies particularly at L2-3 likely degenerative in etiology and not significantly changed since 2024. No destructive osseous lesion. Decompressive laminectomy and posterior interbody fusion at L5-S1. Left L5 pedicle screw traverses the superior endplate and partially involves the L4-5 disc space. Degenerative changes as above. Moderate to severe spinal canal stenosis at L4-5 and moderate spinal canal stenosis at L3-4. Lateral recess narrowing at multiple levels. Multilevel foraminal stenosis most pronounced on the right at L5-S1. Electronically Signed   By: Denny Flack M.D.   On: 12/09/2023 13:16   CT PELVIS WO CONTRAST Result Date: 12/09/2023 CLINICAL DATA:  Pelvic pain, history of bladder neoplasm. EXAM: CT PELVIS WITHOUT CONTRAST TECHNIQUE: Multidetector CT imaging of the pelvis was performed following the standard protocol without intravenous contrast. RADIATION DOSE REDUCTION: This exam was performed according to the departmental dose-optimization program which includes automated exposure control, adjustment of the mA and/or kV according to patient size and/or use of iterative reconstruction technique. COMPARISON:  CT pelvis and abdomen January 31, 2023 FINDINGS: Urinary Tract: Collapsed bladder with a air-fluid level related to  recent instrumentation. Bowel:  Insert 1 Vascular/Lymphatic: No pathologically enlarged lymph nodes. No significant vascular abnormality seen. Reproductive:  No mass or other significant abnormality Other:  None. Musculoskeletal: No suspicious bone  lesions identified. Prior L5-S1 transpedicular fixation with expansile laminectomy with significant degenerative hypertrophic osteoarthritic facet changes Total left hip arthroplasty IMPRESSION: *No acute findings. *Collapsed bladder with a air-fluid level related to recent instrumentation. *Prior L5-S1 transpedicular fixation with expansile laminectomy with significant degenerative hypertrophic osteoarthritic facet changes. Electronically Signed   By: Fredrich Jefferson M.D.   On: 12/09/2023 13:14   DG Hip Unilat W or Wo Pelvis 2-3 Views Right Result Date: 12/01/2023 CLINICAL DATA:  Radiating right lower extremity pain. EXAM: DG HIP (WITH OR WITHOUT PELVIS) 2-3V RIGHT COMPARISON:  None Available. FINDINGS: Pelvic ring is intact. Postop changes left total hip arthroplasty and L5-S1 fusion with discectomy. Right hip degenerative joint disease with joint space narrowing and osteophytes. No acute fracture, dislocation or subluxation. IMPRESSION: Degenerative changes. Electronically Signed   By: Sydell Eva M.D.   On: 12/01/2023 09:22     ASSESSMENT/PLAN:  No problem-specific Assessment & Plan notes found for this encounter.   No orders of the defined types were placed in this encounter.    I spent {CHL ONC TIME VISIT - WGNFA:[id number]} counseling the patient face to face. The total time spent in the appointment was {CHL ONC TIME VISIT - ONGEX:[id number]}.  Samil Mecham L Kinsley Nicklaus, PA-C 12/12/23

## 2023-12-13 NOTE — Telephone Encounter (Signed)
 2nd attempt to reach the pt to schedule an appt IN OFFICE not TELE APPT. Pt last seen 2022 and will need in office appt  for preop clearance.

## 2023-12-16 ENCOUNTER — Ambulatory Visit: Admitting: Physical Therapy

## 2023-12-16 NOTE — Telephone Encounter (Signed)
 Pt has been scheduled for 1st available, 71/125, for surgical clearance.    Will route to the requesting surgeon's office to make them aware.

## 2023-12-17 ENCOUNTER — Encounter (HOSPITAL_COMMUNITY): Payer: Self-pay

## 2023-12-17 ENCOUNTER — Inpatient Hospital Stay

## 2023-12-17 ENCOUNTER — Inpatient Hospital Stay (HOSPITAL_BASED_OUTPATIENT_CLINIC_OR_DEPARTMENT_OTHER): Admitting: Physician Assistant

## 2023-12-17 VITALS — BP 116/49 | HR 71 | Temp 98.0°F | Resp 20

## 2023-12-17 VITALS — BP 155/87 | HR 76 | Temp 97.2°F | Resp 15 | Wt 166.7 lb

## 2023-12-17 DIAGNOSIS — Z5111 Encounter for antineoplastic chemotherapy: Secondary | ICD-10-CM

## 2023-12-17 DIAGNOSIS — M51369 Other intervertebral disc degeneration, lumbar region without mention of lumbar back pain or lower extremity pain: Secondary | ICD-10-CM | POA: Diagnosis not present

## 2023-12-17 DIAGNOSIS — C7910 Secondary malignant neoplasm of unspecified urinary organs: Secondary | ICD-10-CM

## 2023-12-17 DIAGNOSIS — M48061 Spinal stenosis, lumbar region without neurogenic claudication: Secondary | ICD-10-CM | POA: Diagnosis not present

## 2023-12-17 DIAGNOSIS — K5900 Constipation, unspecified: Secondary | ICD-10-CM | POA: Diagnosis not present

## 2023-12-17 DIAGNOSIS — R197 Diarrhea, unspecified: Secondary | ICD-10-CM | POA: Diagnosis not present

## 2023-12-17 DIAGNOSIS — N3289 Other specified disorders of bladder: Secondary | ICD-10-CM | POA: Diagnosis not present

## 2023-12-17 DIAGNOSIS — L513 Stevens-Johnson syndrome-toxic epidermal necrolysis overlap syndrome: Secondary | ICD-10-CM | POA: Diagnosis not present

## 2023-12-17 DIAGNOSIS — C679 Malignant neoplasm of bladder, unspecified: Secondary | ICD-10-CM

## 2023-12-17 DIAGNOSIS — M533 Sacrococcygeal disorders, not elsewhere classified: Secondary | ICD-10-CM | POA: Diagnosis not present

## 2023-12-17 DIAGNOSIS — I739 Peripheral vascular disease, unspecified: Secondary | ICD-10-CM | POA: Diagnosis not present

## 2023-12-17 DIAGNOSIS — C7800 Secondary malignant neoplasm of unspecified lung: Secondary | ICD-10-CM

## 2023-12-17 DIAGNOSIS — I252 Old myocardial infarction: Secondary | ICD-10-CM | POA: Diagnosis not present

## 2023-12-17 DIAGNOSIS — I2510 Atherosclerotic heart disease of native coronary artery without angina pectoris: Secondary | ICD-10-CM | POA: Diagnosis not present

## 2023-12-17 DIAGNOSIS — M47819 Spondylosis without myelopathy or radiculopathy, site unspecified: Secondary | ICD-10-CM | POA: Diagnosis not present

## 2023-12-17 DIAGNOSIS — M4316 Spondylolisthesis, lumbar region: Secondary | ICD-10-CM | POA: Diagnosis not present

## 2023-12-17 DIAGNOSIS — D649 Anemia, unspecified: Secondary | ICD-10-CM | POA: Diagnosis not present

## 2023-12-17 DIAGNOSIS — F17210 Nicotine dependence, cigarettes, uncomplicated: Secondary | ICD-10-CM | POA: Diagnosis not present

## 2023-12-17 DIAGNOSIS — I129 Hypertensive chronic kidney disease with stage 1 through stage 4 chronic kidney disease, or unspecified chronic kidney disease: Secondary | ICD-10-CM | POA: Diagnosis not present

## 2023-12-17 DIAGNOSIS — N1831 Chronic kidney disease, stage 3a: Secondary | ICD-10-CM | POA: Diagnosis not present

## 2023-12-17 DIAGNOSIS — Z95828 Presence of other vascular implants and grafts: Secondary | ICD-10-CM

## 2023-12-17 DIAGNOSIS — I700 Atherosclerosis of aorta: Secondary | ICD-10-CM | POA: Diagnosis not present

## 2023-12-17 DIAGNOSIS — M1611 Unilateral primary osteoarthritis, right hip: Secondary | ICD-10-CM | POA: Diagnosis not present

## 2023-12-17 DIAGNOSIS — R110 Nausea: Secondary | ICD-10-CM | POA: Diagnosis not present

## 2023-12-17 DIAGNOSIS — Z5112 Encounter for antineoplastic immunotherapy: Secondary | ICD-10-CM | POA: Diagnosis not present

## 2023-12-17 LAB — CBC WITH DIFFERENTIAL (CANCER CENTER ONLY)
Abs Immature Granulocytes: 0.15 10*3/uL — ABNORMAL HIGH (ref 0.00–0.07)
Basophils Absolute: 0.1 10*3/uL (ref 0.0–0.1)
Basophils Relative: 1 %
Eosinophils Absolute: 0.3 10*3/uL (ref 0.0–0.5)
Eosinophils Relative: 4 %
HCT: 32.7 % — ABNORMAL LOW (ref 36.0–46.0)
Hemoglobin: 10.4 g/dL — ABNORMAL LOW (ref 12.0–15.0)
Immature Granulocytes: 2 %
Lymphocytes Relative: 36 %
Lymphs Abs: 2.4 10*3/uL (ref 0.7–4.0)
MCH: 31 pg (ref 26.0–34.0)
MCHC: 31.8 g/dL (ref 30.0–36.0)
MCV: 97.3 fL (ref 80.0–100.0)
Monocytes Absolute: 0.8 10*3/uL (ref 0.1–1.0)
Monocytes Relative: 12 %
Neutro Abs: 2.9 10*3/uL (ref 1.7–7.7)
Neutrophils Relative %: 45 %
Platelet Count: 408 10*3/uL — ABNORMAL HIGH (ref 150–400)
RBC: 3.36 MIL/uL — ABNORMAL LOW (ref 3.87–5.11)
RDW: 14.7 % (ref 11.5–15.5)
WBC Count: 6.7 10*3/uL (ref 4.0–10.5)
nRBC: 0.3 % — ABNORMAL HIGH (ref 0.0–0.2)

## 2023-12-17 LAB — CMP (CANCER CENTER ONLY)
ALT: 11 U/L (ref 0–44)
AST: 12 U/L — ABNORMAL LOW (ref 15–41)
Albumin: 3.6 g/dL (ref 3.5–5.0)
Alkaline Phosphatase: 78 U/L (ref 38–126)
Anion gap: 6 (ref 5–15)
BUN: 18 mg/dL (ref 8–23)
CO2: 27 mmol/L (ref 22–32)
Calcium: 9 mg/dL (ref 8.9–10.3)
Chloride: 104 mmol/L (ref 98–111)
Creatinine: 1.27 mg/dL — ABNORMAL HIGH (ref 0.44–1.00)
GFR, Estimated: 44 mL/min — ABNORMAL LOW (ref >60)
Glucose, Bld: 107 mg/dL — ABNORMAL HIGH (ref 70–99)
Potassium: 4.6 mmol/L (ref 3.5–5.1)
Sodium: 137 mmol/L (ref 135–145)
Total Bilirubin: 0.2 mg/dL (ref 0.0–1.2)
Total Protein: 6.4 g/dL — ABNORMAL LOW (ref 6.5–8.1)

## 2023-12-17 LAB — PHOSPHORUS: Phosphorus: 3.5 mg/dL (ref 2.5–4.6)

## 2023-12-17 LAB — MAGNESIUM: Magnesium: 1.7 mg/dL (ref 1.7–2.4)

## 2023-12-17 MED ORDER — SODIUM CHLORIDE 0.9% FLUSH
10.0000 mL | Freq: Once | INTRAVENOUS | Status: AC
  Administered 2023-12-17: 10 mL

## 2023-12-17 MED ORDER — DIPHENHYDRAMINE HCL 50 MG/ML IJ SOLN
50.0000 mg | Freq: Once | INTRAMUSCULAR | Status: AC
  Administered 2023-12-17: 50 mg via INTRAVENOUS
  Filled 2023-12-17: qty 1

## 2023-12-17 MED ORDER — ACETAMINOPHEN 325 MG PO TABS
650.0000 mg | ORAL_TABLET | Freq: Once | ORAL | Status: AC
  Administered 2023-12-17: 650 mg via ORAL
  Filled 2023-12-17: qty 2

## 2023-12-17 MED ORDER — SODIUM CHLORIDE 0.9 % IV SOLN: INTRAVENOUS | Status: DC

## 2023-12-17 MED ORDER — HEPARIN SOD (PORK) LOCK FLUSH 100 UNIT/ML IV SOLN
500.0000 [IU] | Freq: Once | INTRAVENOUS | Status: AC | PRN
  Administered 2023-12-17: 500 [IU]

## 2023-12-17 MED ORDER — DEXAMETHASONE SODIUM PHOSPHATE 10 MG/ML IJ SOLN
10.0000 mg | Freq: Once | INTRAMUSCULAR | Status: AC
  Administered 2023-12-17: 10 mg via INTRAVENOUS
  Filled 2023-12-17: qty 1

## 2023-12-17 MED ORDER — FAMOTIDINE IN NACL 20-0.9 MG/50ML-% IV SOLN
20.0000 mg | Freq: Once | INTRAVENOUS | Status: AC
  Administered 2023-12-17: 20 mg via INTRAVENOUS
  Filled 2023-12-17: qty 50

## 2023-12-17 MED ORDER — SODIUM CHLORIDE 0.9 % IV SOLN
150.0000 mg | Freq: Once | INTRAVENOUS | Status: AC
  Administered 2023-12-17: 150 mg via INTRAVENOUS
  Filled 2023-12-17: qty 150

## 2023-12-17 MED ORDER — PALONOSETRON HCL INJECTION 0.25 MG/5ML
0.2500 mg | Freq: Once | INTRAVENOUS | Status: AC
  Administered 2023-12-17: 0.25 mg via INTRAVENOUS
  Filled 2023-12-17: qty 5

## 2023-12-17 MED ORDER — SODIUM CHLORIDE 0.9 % IV SOLN
10.0000 mg/kg | Freq: Once | INTRAVENOUS | Status: AC
  Administered 2023-12-17: 720 mg via INTRAVENOUS
  Filled 2023-12-17: qty 72

## 2023-12-17 MED ORDER — SODIUM CHLORIDE 0.9% FLUSH
10.0000 mL | INTRAVENOUS | Status: DC | PRN
  Administered 2023-12-17: 10 mL

## 2023-12-17 NOTE — Progress Notes (Addendum)
 Date of COVID positive in last 90 days:  PCP - Elna Potters, MD Cardiologist - Vinie Maxcy, MD (clearance appt scheduled for 7/1)  Chest x-ray - 07-15-23 Epic EKG - 07-17-23 Epic Stress Test - 01-31-21 Epic ECHO - 02-02-21 Epic Cardiac Cath -  Pacemaker/ICD device last checked: Spinal Cord Stimulator:  Bowel Prep -   Sleep Study - Yes,  CPAP -   Fasting Blood Sugar -  Checks Blood Sugar _____ times a day  Last dose of GLP1 agonist-  N/A GLP1 instructions:  Do not take after     Last dose of SGLT-2 inhibitors-  N/A SGLT-2 instructions:  Do not take after     Blood Thinner Instructions:  Last dose:   Time: Aspirin  Instructions: Last Dose:  Activity level:  Can go up a flight of stairs and perform activities of daily living without stopping and without symptoms of chest pain or shortness of breath.  Able to exercise without symptoms  Unable to go up a flight of stairs without symptoms of     Anesthesia review: Hx of NSTEMI, cardiomyopathy, HTN, CKD, COPD  Patient denies shortness of breath, fever, cough and chest pain at PAT appointment  Patient verbalized understanding of instructions that were given to them at the PAT appointment. Patient was also instructed that they will need to review over the PAT instructions again at home before surgery.

## 2023-12-17 NOTE — Progress Notes (Signed)
 Faxed lab work to [facility] @ [phone] with confirmation.

## 2023-12-17 NOTE — Patient Instructions (Addendum)
 SURGICAL WAITING ROOM VISITATION Patients having surgery or a procedure may have no more than 2 support people in the waiting area - these visitors may rotate.    Children under the age of 38 must have an adult with them who is not the patient.  If the patient needs to stay at the hospital during part of their recovery, the visitor guidelines for inpatient rooms apply. Pre-op nurse will coordinate an appropriate time for 1 support person to accompany patient in pre-op.  This support person may not rotate.    Please refer to the [facility] website for the visitor guidelines for Inpatients (after your surgery is over and you are in a regular room).       Your procedure is scheduled on: 12-25-23   Report to [facility] Main Entrance    Report to admitting at 7:45 AM   Call this number if you have problems the morning of surgery [phone]   Do not eat food or drink liquids :After Midnight.          If you have questions, please contact your surgeon's office.  FOLLOW BOWEL PREP AND ANY ADDITIONAL PRE OP INSTRUCTIONS YOU RECEIVED FROM YOUR SURGEON'S OFFICE!!!     Oral Hygiene is also important to reduce your risk of infection.                                    Remember - BRUSH YOUR TEETH THE MORNING OF SURGERY WITH YOUR REGULAR TOOTHPASTE   Do NOT smoke after Midnight   Take these medicines the morning of surgery with A SIP OF WATER :    Amlodipine    Levothyroxine    Metoprolol    Singulair    Okay to use inhalers and nasal spray   If needed Ondansetron /Compazine , Lorazepam    Pletal   Aspirin   Stop all vitamins and herbal supplements 7 days before surgery  Bring CPAP mask and tubing day of surgery.                              You may not have any metal on your body including hair pins, jewelry, and body piercing             Do not wear make-up, lotions, powders, perfumes or deodorant  Do not wear nail polish including gel and S&S, artificial/acrylic nails, or any  other type of covering on natural nails including finger and toenails. If you have artificial nails, gel coating, etc. that needs to be removed by a nail salon please have this removed prior to surgery or surgery may need to be canceled/ delayed if the surgeon/ anesthesia feels like they are unable to be safely monitored.   Do not shave  48 hours prior to surgery.          Do not bring valuables to the hospital. [facility] IS NOT RESPONSIBLE   FOR VALUABLES.   Contacts, dentures or bridgework may not be worn into surgery.  DO NOT BRING YOUR HOME MEDICATIONS TO THE HOSPITAL. PHARMACY WILL DISPENSE MEDICATIONS LISTED ON YOUR MEDICATION LIST TO YOU DURING YOUR ADMISSION IN THE HOSPITAL!    Patients discharged on the day of surgery will not be allowed to drive home.  Someone NEEDS to stay with you for the first 24 hours after anesthesia.   Special Instructions: Bring a copy of your healthcare power of  attorney and living will documents the day of surgery if you haven't scanned them before.              Please read over the following fact sheets you were given: IF YOU HAVE QUESTIONS ABOUT YOUR PRE-OP INSTRUCTIONS PLEASE CALL [phone] [place]  If you received a COVID test during your pre-op visit  it is requested that you wear a mask when out in public, stay away from anyone that may not be feeling well and notify your surgeon if you develop symptoms. If you test positive for Covid or have been in contact with anyone that has tested positive in the last 10 days please notify you surgeon.   - Preparing for Surgery Before surgery, you can play an important role.  Because skin is not sterile, your skin needs to be as free of germs as possible.  You can reduce the number of germs on your skin by washing with CHG (chlorahexidine gluconate) soap before surgery.  CHG is an antiseptic cleaner which kills germs and bonds with the skin to continue killing germs even after washing. Please DO NOT use  if you have an allergy to CHG or antibacterial soaps.  If your skin becomes reddened/irritated stop using the CHG and inform your nurse when you arrive at Short Stay. Do not shave (including legs and underarms) for at least 48 hours prior to the first CHG shower.  You may shave your face/neck.  Please follow these instructions carefully:  1.  Shower with CHG Soap the night before surgery and the  morning of surgery.  2.  If you choose to wash your hair, wash your hair first as usual with your normal  shampoo.  3.  After you shampoo, rinse your hair and body thoroughly to remove the shampoo.                             4.  Use CHG as you would any other liquid soap.  You can apply chg directly to the skin and wash.  Gently with a scrungie or clean washcloth.  5.  Apply the CHG Soap to your body ONLY FROM THE NECK DOWN.   Do   not use on face/ open                           Wound or open sores. Avoid contact with eyes, ears mouth and   genitals (private parts).                       Wash face,  Genitals (private parts) with your normal soap.             6.  Wash thoroughly, paying special attention to the area where your    surgery  will be performed.  7.  Thoroughly rinse your body with warm water  from the neck down.  8.  DO NOT shower/wash with your normal soap after using and rinsing off the CHG Soap.                9.  Pat yourself dry with a clean towel.            10.  Wear clean pajamas.            11.  Place clean sheets on your bed the night of your first shower and do not  sleep with pets. Day of Surgery : Do not apply any lotions/deodorants the morning of surgery.  Please wear clean clothes to the hospital/surgery center.  FAILURE TO FOLLOW THESE INSTRUCTIONS MAY RESULT IN THE CANCELLATION OF YOUR SURGERY  PATIENT SIGNATURE_________________________________  NURSE SIGNATURE__________________________________  ________________________________________________________________________

## 2023-12-18 ENCOUNTER — Encounter (HOSPITAL_COMMUNITY)
Admission: RE | Admit: 2023-12-18 | Discharge: 2023-12-18 | Disposition: A | Discharge status: Home / Self Care | Source: Ambulatory Visit | Attending: Urology | Admitting: Urology

## 2023-12-18 ENCOUNTER — Encounter (HOSPITAL_COMMUNITY): Payer: Self-pay

## 2023-12-18 ENCOUNTER — Other Ambulatory Visit: Payer: Self-pay

## 2023-12-18 VITALS — BP 143/56 | HR 86 | Temp 98.3°F | Resp 18 | Ht 68.0 in | Wt 166.0 lb

## 2023-12-18 DIAGNOSIS — I2510 Atherosclerotic heart disease of native coronary artery without angina pectoris: Secondary | ICD-10-CM | POA: Insufficient documentation

## 2023-12-18 DIAGNOSIS — D649 Anemia, unspecified: Secondary | ICD-10-CM | POA: Diagnosis not present

## 2023-12-18 DIAGNOSIS — Z01812 Encounter for preprocedural laboratory examination: Secondary | ICD-10-CM | POA: Diagnosis not present

## 2023-12-18 HISTORY — DX: Hypothyroidism, unspecified: E03.9

## 2023-12-18 HISTORY — DX: Family history of other specified conditions: Z84.89

## 2023-12-18 HISTORY — DX: Anemia, unspecified: D64.9

## 2023-12-18 LAB — CBC
HCT: 34 % — ABNORMAL LOW (ref 36.0–46.0)
Hemoglobin: 10.5 g/dL — ABNORMAL LOW (ref 12.0–15.0)
MCH: 31.6 pg (ref 26.0–34.0)
MCHC: 30.9 g/dL (ref 30.0–36.0)
MCV: 102.4 fL — ABNORMAL HIGH (ref 80.0–100.0)
Platelets: 484 10*3/uL — ABNORMAL HIGH (ref 150–400)
RBC: 3.32 MIL/uL — ABNORMAL LOW (ref 3.87–5.11)
RDW: 14.9 % (ref 11.5–15.5)
WBC: 10.1 10*3/uL (ref 4.0–10.5)
nRBC: 0 % (ref 0.0–0.2)

## 2023-12-18 LAB — BASIC METABOLIC PANEL WITH GFR
Anion gap: 7 (ref 5–15)
BUN: 31 mg/dL — ABNORMAL HIGH (ref 8–23)
CO2: 24 mmol/L (ref 22–32)
Calcium: 9.3 mg/dL (ref 8.9–10.3)
Chloride: 106 mmol/L (ref 98–111)
Creatinine, Ser: 1.39 mg/dL — ABNORMAL HIGH (ref 0.44–1.00)
GFR, Estimated: 40 mL/min — ABNORMAL LOW (ref >60)
Glucose, Bld: 141 mg/dL — ABNORMAL HIGH (ref 70–99)
Potassium: 4.8 mmol/L (ref 3.5–5.1)
Sodium: 137 mmol/L (ref 135–145)

## 2023-12-22 ENCOUNTER — Emergency Department (HOSPITAL_COMMUNITY)
Admission: EM | Admit: 2023-12-22 | Discharge: 2023-12-22 | Disposition: A | Discharge status: Home / Self Care | Attending: Emergency Medicine | Admitting: Emergency Medicine

## 2023-12-22 ENCOUNTER — Emergency Department (HOSPITAL_COMMUNITY)

## 2023-12-22 ENCOUNTER — Other Ambulatory Visit: Payer: Self-pay

## 2023-12-22 DIAGNOSIS — I129 Hypertensive chronic kidney disease with stage 1 through stage 4 chronic kidney disease, or unspecified chronic kidney disease: Secondary | ICD-10-CM | POA: Diagnosis not present

## 2023-12-22 DIAGNOSIS — N1831 Chronic kidney disease, stage 3a: Secondary | ICD-10-CM | POA: Diagnosis not present

## 2023-12-22 DIAGNOSIS — J4489 Other specified chronic obstructive pulmonary disease: Secondary | ICD-10-CM | POA: Diagnosis not present

## 2023-12-22 DIAGNOSIS — C679 Malignant neoplasm of bladder, unspecified: Secondary | ICD-10-CM | POA: Diagnosis not present

## 2023-12-22 DIAGNOSIS — E039 Hypothyroidism, unspecified: Secondary | ICD-10-CM | POA: Insufficient documentation

## 2023-12-22 DIAGNOSIS — Z7982 Long term (current) use of aspirin: Secondary | ICD-10-CM | POA: Diagnosis not present

## 2023-12-22 DIAGNOSIS — M25551 Pain in right hip: Secondary | ICD-10-CM | POA: Insufficient documentation

## 2023-12-22 DIAGNOSIS — M48061 Spinal stenosis, lumbar region without neurogenic claudication: Secondary | ICD-10-CM | POA: Diagnosis not present

## 2023-12-22 DIAGNOSIS — M5136 Other intervertebral disc degeneration, lumbar region with discogenic back pain only: Secondary | ICD-10-CM | POA: Diagnosis not present

## 2023-12-22 DIAGNOSIS — M419 Scoliosis, unspecified: Secondary | ICD-10-CM | POA: Diagnosis not present

## 2023-12-22 DIAGNOSIS — Z8551 Personal history of malignant neoplasm of bladder: Secondary | ICD-10-CM | POA: Insufficient documentation

## 2023-12-22 DIAGNOSIS — M79651 Pain in right thigh: Secondary | ICD-10-CM | POA: Insufficient documentation

## 2023-12-22 DIAGNOSIS — Z87891 Personal history of nicotine dependence: Secondary | ICD-10-CM | POA: Insufficient documentation

## 2023-12-22 DIAGNOSIS — I2510 Atherosclerotic heart disease of native coronary artery without angina pectoris: Secondary | ICD-10-CM | POA: Diagnosis not present

## 2023-12-22 DIAGNOSIS — Z79899 Other long term (current) drug therapy: Secondary | ICD-10-CM | POA: Diagnosis not present

## 2023-12-22 LAB — CBC WITH DIFFERENTIAL/PLATELET
Abs Immature Granulocytes: 0.02 10*3/uL (ref 0.00–0.07)
Basophils Absolute: 0 10*3/uL (ref 0.0–0.1)
Basophils Relative: 1 %
Eosinophils Absolute: 0.1 10*3/uL (ref 0.0–0.5)
Eosinophils Relative: 2 %
HCT: 38.4 % (ref 36.0–46.0)
Hemoglobin: 12.1 g/dL (ref 12.0–15.0)
Immature Granulocytes: 0 %
Lymphocytes Relative: 40 %
Lymphs Abs: 2.4 10*3/uL (ref 0.7–4.0)
MCH: 31.6 pg (ref 26.0–34.0)
MCHC: 31.5 g/dL (ref 30.0–36.0)
MCV: 100.3 fL — ABNORMAL HIGH (ref 80.0–100.0)
Monocytes Absolute: 0.2 10*3/uL (ref 0.1–1.0)
Monocytes Relative: 4 %
Neutro Abs: 3.2 10*3/uL (ref 1.7–7.7)
Neutrophils Relative %: 53 %
Platelets: 437 10*3/uL — ABNORMAL HIGH (ref 150–400)
RBC: 3.83 MIL/uL — ABNORMAL LOW (ref 3.87–5.11)
RDW: 14.6 % (ref 11.5–15.5)
WBC: 6.1 10*3/uL (ref 4.0–10.5)
nRBC: 0 % (ref 0.0–0.2)

## 2023-12-22 LAB — BASIC METABOLIC PANEL WITH GFR
Anion gap: 8 (ref 5–15)
BUN: 25 mg/dL — ABNORMAL HIGH (ref 8–23)
CO2: 25 mmol/L (ref 22–32)
Calcium: 8.8 mg/dL — ABNORMAL LOW (ref 8.9–10.3)
Chloride: 100 mmol/L (ref 98–111)
Creatinine, Ser: 1.3 mg/dL — ABNORMAL HIGH (ref 0.44–1.00)
GFR, Estimated: 43 mL/min — ABNORMAL LOW (ref >60)
Glucose, Bld: 141 mg/dL — ABNORMAL HIGH (ref 70–99)
Potassium: 4.4 mmol/L (ref 3.5–5.1)
Sodium: 133 mmol/L — ABNORMAL LOW (ref 135–145)

## 2023-12-22 MED ORDER — ACETAMINOPHEN 500 MG PO TABS
1000.0000 mg | ORAL_TABLET | Freq: Once | ORAL | Status: AC
  Administered 2023-12-22: 1000 mg via ORAL
  Filled 2023-12-22: qty 2

## 2023-12-22 MED ORDER — CYCLOBENZAPRINE HCL 10 MG PO TABS: 10.0000 mg | ORAL_TABLET | Freq: Two times a day (BID) | ORAL | 0 refills | Status: AC | PRN

## 2023-12-22 MED ORDER — HYDROMORPHONE HCL 1 MG/ML IJ SOLN
1.0000 mg | Freq: Once | INTRAMUSCULAR | Status: AC
  Administered 2023-12-22: 1 mg via INTRAVENOUS
  Filled 2023-12-22: qty 1

## 2023-12-22 MED ORDER — OXYCODONE HCL 5 MG PO TABS: 5.0000 mg | ORAL_TABLET | ORAL | 0 refills | Status: DC | PRN

## 2023-12-22 MED ORDER — DIPHENHYDRAMINE HCL 50 MG/ML IJ SOLN
12.5000 mg | Freq: Once | INTRAMUSCULAR | Status: AC
  Administered 2023-12-22: 12.5 mg via INTRAVENOUS
  Filled 2023-12-22: qty 1

## 2023-12-22 MED ORDER — CYCLOBENZAPRINE HCL 10 MG PO TABS
10.0000 mg | ORAL_TABLET | Freq: Once | ORAL | Status: AC
  Administered 2023-12-22: 10 mg via ORAL
  Filled 2023-12-22: qty 1

## 2023-12-22 NOTE — ED Triage Notes (Signed)
 Patient to ED by POV with c/o right side thigh pain. Denies injury to cause pain states it has been going on for a month.

## 2023-12-22 NOTE — ED Provider Notes (Signed)
 [name] [facility] AT [facility] Provider Note  CSN: [id number] Arrival date & time: 12/22/23 9077  Chief Complaint(s) Hip Pain (Thigh )  HPI Morgan Torres is a 73 y.o. female here today with 1 month of right hip and thigh pain.  Patient has a history of metastatic urothelial carcinoma.  She is post to have a procedure on her bladder on Tuesday.  Patient has been seen 2 times in the ED over the last 1 month for the symptoms.  At her last visit, she had CT imaging of her lumbar spine and pelvis without acute process.  She was discharged with analgesia, continues to have pain.   Past Medical History Past Medical History:  Diagnosis Date   Anemia    Anxiety    Arthritis    hip, lumbar spine    Asthma    seasonal allergies    Bronchitis    Hx: of   Chronic kidney disease    COPD (chronic obstructive pulmonary disease) (HCC)    Coronary artery disease    Depression    Family history of adverse reaction to anesthesia    Sister hard to wake up   Fibromyalgia    Hypertension    Hypothyroidism    Lumbar herniated disc    Metastatic urothelial carcinoma (HCC)    Myocardial infarction (HCC) 06/25/2012   followed by Dr. Mona, treated medically, no stents   Peripheral arterial disease (HCC)    of right foot   Pneumonia    Hx: of yrs ago   Sciatica    Sleep apnea    Small bowel obstruction (HCC)    several yrs ago   Patient Active Problem List   Diagnosis Date Noted   Rash and nonspecific skin eruption 09/02/2023   Dehydration 09/02/2023   Acute prerenal azotemia 09/02/2023   CKD stage 3a, GFR 45-59 ml/min (HCC) 09/02/2023   History of COPD 09/02/2023   Acquired hypothyroidism 09/02/2023   Chronic pain syndrome 09/02/2023   Hyponatremia 07/15/2023   Encounter for antineoplastic immunotherapy 11/29/2022   Goals of care, counseling/discussion 11/05/2022   Port-A-Cath in place 07/12/2022   Metastatic urothelial carcinoma (HCC) 05/30/2022   Bladder  cancer metastasized to lung (HCC) 05/30/2022   Encounter for antineoplastic chemotherapy 05/30/2022   Renal mass 03/09/2021   Morbid obesity, unspecified obesity type (HCC) 08/28/2016   Non-restorative sleep 08/28/2016   Snoring 08/28/2016   Hip pain 06/03/2014   Acute blood loss anemia 06/01/2014   Osteoarthritis of left hip 05/28/2014   Obesity (BMI 30.0-34.9) 04/20/2013   Nonischemic cardiomyopathy (HCC) 04/01/2013   Smoking 04/01/2013   NSTEMI - Troponin pk 1.5 with ant TWI on EKG 03/29/2013   Spinal stenosis, lumbar region, with neurogenic claudication 01/23/2013   VAGINAL TRICHOMONIASIS 06/28/2006   Hyperlipidemia 06/28/2006   Anemia of chronic disease 06/28/2006   GAD (generalized anxiety disorder) 06/28/2006   DEPRESSION 06/28/2006   SYNDROME, CARPAL TUNNEL 06/28/2006   Essential hypertension 06/28/2006   ALLERGIC RHINITIS 06/28/2006   ASTHMA 06/28/2006   PAROTIDITIS 06/28/2006   LOW BACK PAIN 06/28/2006   TB SKIN TEST, POSITIVE, HX OF 06/28/2006   Home Medication(s) Prior to Admission medications   Medication Sig Start Date End Date Taking? Authorizing Provider  cyclobenzaprine  (FLEXERIL ) 10 MG tablet Take 1 tablet (10 mg total) by mouth 2 (two) times daily as needed for muscle spasms. 12/22/23  Yes Mannie Pac T, DO  oxyCODONE  (ROXICODONE ) 5 MG immediate release tablet Take 1 tablet (5 mg  total) by mouth every 4 (four) hours as needed for severe pain (pain score 7-10). 12/22/23  Yes Mannie Pac T, DO  acetaminophen  (TYLENOL ) 500 MG tablet Take 1 tablet (500 mg total) by mouth every 6 (six) hours as needed for mild pain. 10/14/22   Rising, Asberry, PA-C  albuterol  (PROVENTIL  HFA;VENTOLIN  HFA) 108 (90 Base) MCG/ACT inhaler Inhale 2 puffs into the lungs every 6 (six) hours as needed for wheezing or shortness of breath. 05/10/16   Pearl Jenkins Lesches, NP  amLODipine  (NORVASC ) 10 MG tablet Take 10 mg by mouth daily.    [provider]  aspirin  EC 81 MG tablet Take  81 mg by mouth daily. Swallow whole.    [provider]  benzonatate  (TESSALON ) 100 MG capsule Take 100 mg by mouth 3 (three) times daily as needed for cough. 12/18/22   Leigh Lung, MD  calcium  acetate (PHOSLO ) 667 MG tablet Take 667 mg by mouth 3 (three) times daily with meals.    [provider]  cilostazol  (PLETAL ) 50 MG tablet Take 50 mg by mouth 2 (two) times daily.    [provider]  clotrimazole  (LOTRIMIN ) 1 % external solution Apply topically 2 (two) times daily. Patient taking differently: Apply 1 Application topically 2 (two) times daily. 07/20/23   Regalado, Belkys A, MD  dexamethasone  (DECADRON ) 4 MG tablet Take 2 tablets (8 mg) daily for 3 days. Start the day after chemotherapy. 10/29/23   Sherrod Sherrod, MD  EPINEPHrine  (EPIPEN  2-PAK) 0.3 mg/0.3 mL DEVI Inject 0.3 mLs (0.3 mg total) into the muscle once as needed (for severe allergic reaction). CAll 911 immediately if you have to use this medicine Patient taking differently: Inject 0.3 mg into the muscle once as needed (for a severe allergic reaction- CALL 9-1-1 IMMEDIATELY IF USED). 11/26/12   Piepenbrink, Delon, PA-C  gabapentin  (NEURONTIN ) 300 MG capsule Take 300 mg by mouth at bedtime. 09/24/23   [provider]  guaiFENesin  (MUCINEX ) 600 MG 12 hr tablet Take 2 tablets (1,200 mg total) by mouth 2 (two) times daily. Patient taking differently: Take 1,200 mg by mouth 2 (two) times daily as needed for to loosen phlegm. 07/20/23   Regalado, Belkys A, MD  ipratropium (ATROVENT ) 0.06 % nasal spray Place 2 sprays into the nose daily as needed for rhinitis. 09/24/23   [provider]  Lanolin Alcohol  WAX Apply topically. Patient not taking: Reported on 12/18/2023 09/24/23 12/23/23  [provider]  levothyroxine  (SYNTHROID ) 100 MCG tablet Take 1 tablet (100 mcg total) by mouth daily before breakfast. 11/12/23   Sherrod Sherrod, MD  lidocaine -prilocaine  (EMLA ) cream Apply 1 Application topically  as needed. Patient taking differently: Apply 1 Application topically as needed (port). 10/30/23   Heilingoetter, Cassandra L, PA-C  loperamide  (IMODIUM ) 2 MG capsule Take 2 tabs by mouth with first loose stool, then 1 tab with each additional loose stool as needed. Do not exceed 8 tabs in a 24-hour period Patient taking differently: Take 2-4 mg by mouth as needed for diarrhea or loose stools. Take 2 tabs by mouth with first loose stool, then 1 tab with each additional loose stool as needed. Do not exceed 8 tabs in a 24-hour period 10/29/23   Sherrod Sherrod, MD  LORazepam  (ATIVAN ) 0.5 MG tablet Take 0.5 mg by mouth daily as needed for anxiety. 07/12/23   [provider]  magnesium  oxide (MAG-OX) 400 (240 Mg) MG tablet TAKE 1 TABLET BY MOUTH EVERY DAY 11/27/23   Heilingoetter, Cassandra L, PA-C  methocarbamol  (ROBAXIN ) 500 MG tablet Take 1 tablet (500 mg total) by mouth 2 (two) times daily. 12/01/23   Dasie Faden, MD  metoprolol  tartrate (LOPRESSOR ) 25 MG tablet Take 1 tablet by mouth 2 (two) times daily. 09/24/23   [provider]  MILK OF MAGNESIA 400 MG/5ML suspension Take 15-30 mLs by mouth daily as needed for mild constipation.    [provider]  montelukast  (SINGULAIR ) 10 MG tablet Take 10 mg by mouth daily.    [provider]  naloxone  (NARCAN ) 2 MG/2ML injection Inject 0.4 mg into the muscle as needed (OD). 09/24/23 12/23/23  [provider]  nicotine  (NICODERM CQ  - DOSED IN MG/24 HOURS) 21 mg/24hr patch Place 21 mg onto the skin daily.    [provider]  nicotine  polacrilex (NICORETTE ) 4 MG gum Take 4 mg by mouth as needed for smoking cessation.    [provider]  nitroGLYCERIN  (NITROSTAT ) 0.4 MG SL tablet Place 1 tablet (0.4 mg total) under the tongue every 5 (five) minutes x 3 doses as needed for chest pain. 09/25/18   Hilty, Vinie BROCKS, MD  ondansetron  (ZOFRAN ) 8 MG tablet Take 1 tablet (8 mg total) by mouth every 8 (eight) hours as needed  for nausea or vomiting. Start on the third day after chemotherapy. 10/29/23   Sherrod Sherrod, MD  oxyCODONE -acetaminophen  (PERCOCET/ROXICET) 5-325 MG tablet Take 1 tablet by mouth every 6 (six) hours as needed for severe pain (pain score 7-10). Patient not taking: Reported on 12/18/2023 12/01/23   Dasie Faden, MD  Potassium & Sodium Phosphates (PHOSPHORUS  W/SOD & POTASSIUM) 280-160-250 MG PACK TAKE 1 PACKAGE BY MOUTH 2 (TWO) TIMES DAILY. 12/04/23   Sherrod Sherrod, MD  prochlorperazine  (COMPAZINE ) 10 MG tablet Take 1 tablet (10 mg total) by mouth every 6 (six) hours as needed. 11/06/23   Heilingoetter, Cassandra L, PA-C  sodium chloride  1 g tablet TAKE 1 TABLET (1 G TOTAL) BY MOUTH DAILY. SPLIT 1 GRAM INTO TWO HALVES AND TAKE BY MOUTH ONCE A DAY Patient taking differently: Take 0.5 g by mouth in the morning and at bedtime. 12/02/23   Heilingoetter, Cassandra L, PA-C  temazepam  (RESTORIL ) 15 MG capsule Take 1 capsule (15 mg total) by mouth at bedtime as needed for sleep. 02/06/23   Heilingoetter, Cassandra L, PA-C  traMADol  (ULTRAM ) 50 MG tablet Take 50 mg by mouth every 6 (six) hours as needed (for pain).    [provider]                                                                                                                                    Past Surgical History Past Surgical History:  Procedure Laterality Date   ABDOMINAL SURGERY     resection of small intestine   ANTERIOR CRUCIATE LIGAMENT REPAIR Bilateral    BACK SURGERY     fusion of lumbar   CARDIAC CATHETERIZATION  06/25/2012   COLON  SURGERY     resection for bowel - for obstruction  6 to 7 yrs ago per pt on 01-05-2022   COLONOSCOPY     Hx; of   DILATION AND CURETTAGE OF UTERUS     EYE SURGERY Bilateral    cataracts removed   HERNIA REPAIR  02/23/1969   inguinal hernia    IR IMAGING GUIDED PORT INSERTION  06/07/2022   LEFT HEART CATHETERIZATION WITH CORONARY ANGIOGRAM N/A 03/30/2013   Procedure: LEFT HEART  CATHETERIZATION WITH CORONARY ANGIOGRAM;  Surgeon: Alm LELON Clay, MD;  Location: [facility] CATH LAB;  Service: Cardiovascular;  Laterality: N/A;   left thumb surgery     yrs ago   ROBOT ASSITED LAPAROSCOPIC NEPHROURETERECTOMY Left 03/09/2021   Procedure: XI ROBOT ASSITED LAPAROSCOPIC NEPHROURETERECTOMY/ CYSTOSCOPY WITH LEFT URETEROSCOPY WITH TRANSURETHRAL RESECTION OF URETERAL ORIFICE;  Surgeon: Devere Lonni Righter, MD;  Location: [facility] ORS;  Service: Urology;  Laterality: Left;   SALIVARY GLAND SURGERY Left    approached from inside mouth & side of neck 1970-1980   TONSILLECTOMY     as child   TOTAL HIP ARTHROPLASTY Left 05/28/2014   Procedure: LEFT TOTAL HIP ARTHROPLASTY;  Surgeon: LELON JONETTA Shari Mickey., MD;  Location: [facility] OR;  Service: Orthopedics;  Laterality: Left;   TRANSURETHRAL RESECTION OF BLADDER TUMOR N/A 08/23/2021   Procedure: TRANSURETHRAL RESECTION OF BLADDER TUMOR (TURBT) WITH CYSTOSCOPY/ POSTOPERATIVE INSTILLATION OF GEMCITABINE /retrograde pyelogram and stent placement (right);  Surgeon: Devere Lonni Righter, MD;  Location: [facility];  Service: Urology;  Laterality: N/A;   TRANSURETHRAL RESECTION OF BLADDER TUMOR N/A 01/10/2022   Procedure: TRANSURETHRAL RESECTION OF BLADDER TUMOR (TURBT) WITH CYSTOSCOPY / GEMCITABINE  INSTILLATION post-operatively;  Surgeon: Devere Lonni Righter, MD;  Location: [facility];  Service: Urology;  Laterality: N/A;  ONLY NEEDS 30 MIN   Family History Family History  Problem Relation Age of Onset   Hypertension Mother    Diabetes Mother    Cancer - Other Mother    Cancer Sister    Breast cancer Daughter     Social History Social History   Tobacco Use   Smoking status: Former    Current packs/day: 0.50    Average packs/day: 0.5 packs/day for 25.0 years (12.5 ttl pk-yrs)    Types: Cigarettes   Smokeless tobacco: Never   Tobacco comments:    Currently on the Nicoderm patch.smokes a few  Vaping Use    Vaping status: Never Used  Substance Use Topics   Alcohol  use: Not Currently   Drug use: No   Allergies Diclofenac, Padcev  [enfortumab vedotin ], Ciprofloxacin, Codeine, and Hydrocodone  Review of Systems Review of Systems  Physical Exam Vital Signs  I have reviewed the triage vital signs BP 137/75 (BP Location: Left Arm)   Pulse 88   Temp 97.7 F (36.5 C) (Oral)   Resp 17   Ht 5' 8 (1.727 m)   Wt 76 kg   SpO2 100%   BMI 25.48 kg/m   Physical Exam Vitals and nursing note reviewed.  Constitutional:      Appearance: Normal appearance.  Pulmonary:     Effort: Pulmonary effort is normal.  Abdominal:     General: Abdomen is flat.     Palpations: Abdomen is soft.   Musculoskeletal:        General: Normal range of motion.     Cervical back: Normal range of motion.     Comments: Patient with tenderness in the right hip, right sacrum.  Patient without pain  with passive range of motion of the right hip   Neurological:     Mental Status: She is alert.     Comments: 5/5 bilateral lower extremity strength.  Normal sensation.  No saddle anesthesia.  Foot strength with plantar and dorsiflexion.    ED Results and Treatments Labs (all labs ordered are listed, but only abnormal results are displayed) Labs Reviewed  BASIC METABOLIC PANEL WITH GFR - Abnormal; Notable for the following components:      Result Value   Sodium 133 (*)    Glucose, Bld 141 (*)    BUN 25 (*)    Creatinine, Ser 1.30 (*)    Calcium  8.8 (*)    GFR, Estimated 43 (*)    All other components within normal limits  CBC WITH DIFFERENTIAL/PLATELET - Abnormal; Notable for the following components:   RBC 3.83 (*)    MCV 100.3 (*)    Platelets 437 (*)    All other components within normal limits                                                                                                                          Radiology MR LUMBAR SPINE WO CONTRAST Result Date: 12/22/2023 CLINICAL DATA:  73 year old  female with low back pain. Prior surgery. History of metastatic urothelial carcinoma. Oral EXAM: MRI LUMBAR SPINE WITHOUT CONTRAST TECHNIQUE: Multiplanar, multisequence MR imaging of the lumbar spine was performed. No intravenous contrast was administered. COMPARISON:  Lumbar spine CT 12/09/2023.  Lumbar MRI 10/03/2022. FINDINGS: Segmentation: Normal on the comparison CT, same numbering system used on the previous MRI. Alignment: Lumbar lordosis with underlying mild levoconvex scoliosis not significantly changed from last year. Vertebrae: Mild chronic hardware susceptibility artifact at L5-S1. Solid arthrodesis there on the recent CT. Chronic severe degenerative appearing sclerosis of the L2 and L3 vertebral bodies, endplates. Appearance is very similar to the MRI last year. Patchy widespread vertebral body marrow edema at that time has resolved but rightward asymmetric endplate marrow edema persists (series 7, image 7). Severe disc space loss there and vacuum disc on the recent CT. Background bone marrow signal is normal. Maintained vertebral height elsewhere. Intact visible sacrum and SI joints. Conus medullaris and cauda equina: Conus extends to the L1-L2 level. No lower spinal cord or conus signal abnormality. Paraspinal and other soft tissues: Stable. Disc levels: Mostly visible T11-T12 level through L1-L2: Mild for age degeneration is stable without spinal stenosis. There is moderate facet hypertrophy at L1-L2. L2-L3: Chronic severe disc and endplate degeneration. Circumferential disc osteophyte complex asymmetric to the right with moderate facet and ligament flavum hypertrophy. Moderate spinal and severe right lateral recess stenosis (series 9, image 18 descending right L3 nerve level) do appear progressed from last year. Moderate to severe left and moderate right L2 foraminal stenosis appear more stable. L3-L4: Chronic circumferential disc bulge and endplate spurring asymmetric to the left with moderate to  severe facet and ligament flavum hypertrophy. Mild to moderate spinal stenosis  appears improved from last year (series 9, image 24). Moderate right lateral recess stenosis (right L4 nerve level). Moderate bilateral L3 foraminal stenosis appears stable and greater on the left. L4-L5: Chronic circumferential disc bulge with endplate spurring. Moderate to severe facet hypertrophy. Degenerative facet joint fluid on the right is new from last year. Moderate ligament flavum hypertrophy. Mild spinal stenosis. Moderate bilateral lateral recess stenosis (L5 nerve levels). Moderate left and moderate to severe right L4 foraminal stenosis. This level is stable. L5-S1:  Stable decompression and fusion. IMPRESSION: 1. Chronic L5-S1 decompression and fusion with stable adjacent segment disease at L4-L5 resulting in moderate lateral recess and moderate to severe right greater than left neural foraminal stenosis. 2. Chronic very severe disc and endplate degeneration at L2-L3 with progressed multifactorial Moderate spinal, severe right lateral recess stenosis since the MRI last year. Stable moderate to severe L2 foraminal stenosis. 3. Stable to mildly improved multifactorial spinal stenosis at L3-L4, now mild-to-moderate. Electronically Signed   By: VEAR Hurst M.D.   On: 12/22/2023 11:26    Pertinent labs & imaging results that were available during my care of the patient were reviewed by me and considered in my medical decision making (see MDM for details).  Medications Ordered in ED Medications  HYDROmorphone  (DILAUDID ) injection 1 mg (1 mg Intravenous Given 12/22/23 1012)  diphenhydrAMINE  (BENADRYL ) injection 12.5 mg (12.5 mg Intravenous Given 12/22/23 1013)  acetaminophen  (TYLENOL ) tablet 1,000 mg (1,000 mg Oral Given 12/22/23 1142)  cyclobenzaprine  (FLEXERIL ) tablet 10 mg (10 mg Oral Given 12/22/23 1142)                                                                                                                                      Procedures Procedures  (including critical care time)  Medical Decision Making / ED Course   This patient presents to the ED for concern of head pain, back pain, this involves an extensive number of treatment options, and is a complaint that carries with it a high risk of complications and morbidity.  The differential diagnosis includes chronic back pain, malignancy, less likely infection.  MDM: On exam, patient with no neurological deficits in her lower extremities.  Has some pretty significant hip and back pain.  Was seen previously, had CT imaging of her lumbar spine and pelvis.  Ordered MRI of the patient's lumbar spine given her history of chronic back pain.  There is no acute process identified.  I believe that this is patient's chronic back pain.  Discussed findings with the patient, she is agreeable with discharge.  Labs reviewed, no anemia, renal baseline.   Additional history obtained: -Additional history obtained from husband at bedside -External records from outside source obtained and reviewed including: Chart review including previous notes, labs, imaging, consultation notes   Lab Tests: -I ordered, reviewed, and interpreted labs.   The pertinent results include:   Labs Reviewed  BASIC METABOLIC PANEL  WITH GFR - Abnormal; Notable for the following components:      Result Value   Sodium 133 (*)    Glucose, Bld 141 (*)    BUN 25 (*)    Creatinine, Ser 1.30 (*)    Calcium  8.8 (*)    GFR, Estimated 43 (*)    All other components within normal limits  CBC WITH DIFFERENTIAL/PLATELET - Abnormal; Notable for the following components:   RBC 3.83 (*)    MCV 100.3 (*)    Platelets 437 (*)    All other components within normal limits       Imaging Studies ordered: I ordered imaging studies including MRI lumbar spine I independently visualized and interpreted imaging. I agree with the radiologist interpretation   Medicines ordered and prescription drug  management: Meds ordered this encounter  Medications   HYDROmorphone  (DILAUDID ) injection 1 mg   diphenhydrAMINE  (BENADRYL ) injection 12.5 mg   acetaminophen  (TYLENOL ) tablet 1,000 mg   cyclobenzaprine  (FLEXERIL ) tablet 10 mg   oxyCODONE  (ROXICODONE ) 5 MG immediate release tablet    Sig: Take 1 tablet (5 mg total) by mouth every 4 (four) hours as needed for severe pain (pain score 7-10).    Dispense:  15 tablet    Refill:  0   cyclobenzaprine  (FLEXERIL ) 10 MG tablet    Sig: Take 1 tablet (10 mg total) by mouth 2 (two) times daily as needed for muscle spasms.    Dispense:  20 tablet    Refill:  0    -I have reviewed the patients home medicines and have made adjustments as needed   Cardiac Monitoring: The patient was maintained on a cardiac monitor.  I personally viewed and interpreted the cardiac monitored which showed an underlying rhythm of: Normal sinus rhythm  Social Determinants of Health:  Factors impacting patients care include: Multiple medical comorbidities including metastatic cancer   Reevaluation: After the interventions noted above, I reevaluated the patient and found that they have :improved  Co morbidities that complicate the patient evaluation  Past Medical History:  Diagnosis Date   Anemia    Anxiety    Arthritis    hip, lumbar spine    Asthma    seasonal allergies    Bronchitis    Hx: of   Chronic kidney disease    COPD (chronic obstructive pulmonary disease) (HCC)    Coronary artery disease    Depression    Family history of adverse reaction to anesthesia    Sister hard to wake up   Fibromyalgia    Hypertension    Hypothyroidism    Lumbar herniated disc    Metastatic urothelial carcinoma (HCC)    Myocardial infarction (HCC) 06/25/2012   followed by Dr. Mona, treated medically, no stents   Peripheral arterial disease (HCC)    of right foot   Pneumonia    Hx: of yrs ago   Sciatica    Sleep apnea    Small bowel obstruction (HCC)    several  yrs ago      Dispostion: I considered admission for this patient, however with the patient's symptoms, she is appropriate for discharge.     Final Clinical Impression(s) / ED Diagnoses Final diagnoses:  Right hip pain     @PCDICTATION @    Mannie Pac T, DO 12/22/23 1320

## 2023-12-22 NOTE — Discharge Instructions (Addendum)
 While you were in the emergency room, you had an MRI done of your lower back.  This showed chronic problems with your back, and no sudden changes.  Like we discussed, I think that the symptoms that you are experiencing are related to your chronic back pain.  I have sent your prescription for medicine called oxycodone .  You can take 5 mg every 6 hours for pain.  I have also sent you a muscle relaxer called Flexeril  that you can take 2 times per day.  These medicines will likely make you sleepy.  Please call your primary care doctor to discuss long-term management of your hip and back pain.

## 2023-12-22 NOTE — Progress Notes (Unsigned)
 Cardiology Clinic Note   Patient Name: Morgan Torres Date of Encounter: 12/24/2023  Primary Care Provider:  Leigh Lung, MD Primary Cardiologist:  Vinie JAYSON Maxcy, MD  Patient Profile    Morgan Torres 73 year old female presents the clinic today for follow-up evaluation of her cardiomyopathy and preoperative cardiac evaluation.  Past Medical History    Past Medical History:  Diagnosis Date   Anemia    Anxiety    Arthritis    hip, lumbar spine    Asthma    seasonal allergies    Bronchitis    Hx: of   Chronic kidney disease    COPD (chronic obstructive pulmonary disease) (HCC)    Coronary artery disease    Depression    Family history of adverse reaction to anesthesia    Sister hard to wake up   Fibromyalgia    Hypertension    Hypothyroidism    Lumbar herniated disc    Metastatic urothelial carcinoma (HCC)    Myocardial infarction (HCC) 06/25/2012   followed by Dr. Maxcy, treated medically, no stents   Peripheral arterial disease (HCC)    of right foot   Pneumonia    Hx: of yrs ago   Sciatica    Sleep apnea    Small bowel obstruction (HCC)    several yrs ago   Past Surgical History:  Procedure Laterality Date   ABDOMINAL SURGERY     resection of small intestine   ANTERIOR CRUCIATE LIGAMENT REPAIR Bilateral    BACK SURGERY     fusion of lumbar   CARDIAC CATHETERIZATION  06/25/2012   COLON SURGERY     resection for bowel - for obstruction  6 to 7 yrs ago per pt on 01-05-2022   COLONOSCOPY     Hx; of   DILATION AND CURETTAGE OF UTERUS     EYE SURGERY Bilateral    cataracts removed   HERNIA REPAIR  02/23/1969   inguinal hernia    IR IMAGING GUIDED PORT INSERTION  06/07/2022   LEFT HEART CATHETERIZATION WITH CORONARY ANGIOGRAM N/A 03/30/2013   Procedure: LEFT HEART CATHETERIZATION WITH CORONARY ANGIOGRAM;  Surgeon: Alm LELON Clay, MD;  Location: [facility] CATH LAB;  Service: Cardiovascular;  Laterality: N/A;   left thumb surgery     yrs ago   ROBOT ASSITED  LAPAROSCOPIC NEPHROURETERECTOMY Left 03/09/2021   Procedure: XI ROBOT ASSITED LAPAROSCOPIC NEPHROURETERECTOMY/ CYSTOSCOPY WITH LEFT URETEROSCOPY WITH TRANSURETHRAL RESECTION OF URETERAL ORIFICE;  Surgeon: Devere Lonni Righter, MD;  Location: [facility] ORS;  Service: Urology;  Laterality: Left;   SALIVARY GLAND SURGERY Left    approached from inside mouth & side of neck 1970-1980   TONSILLECTOMY     as child   TOTAL HIP ARTHROPLASTY Left 05/28/2014   Procedure: LEFT TOTAL HIP ARTHROPLASTY;  Surgeon: LELON JONETTA Shari Mickey., MD;  Location: [facility] OR;  Service: Orthopedics;  Laterality: Left;   TRANSURETHRAL RESECTION OF BLADDER TUMOR N/A 08/23/2021   Procedure: TRANSURETHRAL RESECTION OF BLADDER TUMOR (TURBT) WITH CYSTOSCOPY/ POSTOPERATIVE INSTILLATION OF GEMCITABINE /retrograde pyelogram and stent placement (right);  Surgeon: Devere Lonni Righter, MD;  Location: [facility];  Service: Urology;  Laterality: N/A;   TRANSURETHRAL RESECTION OF BLADDER TUMOR N/A 01/10/2022   Procedure: TRANSURETHRAL RESECTION OF BLADDER TUMOR (TURBT) WITH CYSTOSCOPY / GEMCITABINE  INSTILLATION post-operatively;  Surgeon: Devere Lonni Righter, MD;  Location: [facility];  Service: Urology;  Laterality: N/A;  ONLY NEEDS 30 MIN    Allergies  Allergies  Allergen Reactions  Diclofenac Anaphylaxis   Padcev  [Enfortumab Vedotin ] Other (See Comments)    [name]   Ciprofloxacin Rash   Codeine Itching   Hydrocodone Itching and Other (See Comments)    Skin broke out while taking Norco 7.5/325    History of Present Illness    Morgan Torres has a PMH of essential hypertension, hyperlipidemia, bladder cancer, nonischemic cardiomyopathy, parotiditis, hypothyroidism, CKD stage IIIa, low back pain, hip pain, obesity, COPD, and hyponatremia.  She was seen and evaluated by Dr. Kate on 01/26/2021.  During that time she presented for preoperative cardiac evaluation.  CT abdomen pelvis  7/22 showed mass on her left kidney with features consistent for transitional cell carcinoma.  She was planning resection with urology.  She had undergone cardiac catheterization in 2014 in the setting of NSTEMI and was noted to have moderate nonobstructive CAD.  It was felt that her symptoms were related to Takotsubo cardiomyopathy.  Her subsequent EF was noted to be 55%.  She denied chest pain.  She worked intermittently as a Lawyer.  She was not exercising.  She occasionally would go for short walks.  She did not note dyspnea on exertion.  She noted that when she would walk up stairs she would have to stop about halfway to catch her breath.  She denied lightheadedness and syncope.  She denied lower extremity swelling.  Her echocardiogram showed normal LVEF and mild LVH.  Her stress Torres 01/31/2021 showed low risk and was normal study.  She presents to the clinic today for follow-up evaluation and preoperative cardiac evaluation for transurethral resection of bladder tumor.  She states she is doing well from a heart standpoint.  She stopped taking her aspirin  several days ago.  She has not been feeling well in the last few weeks due to hip and right thigh pain.  She believes this is related to her bladder tumor.  We reviewed her upcoming surgery.  We reviewed her previous clinic visit and past cardiac history.  She expressed understanding.  Her blood pressure today is 118/63.  Her pulse on EKG was noted to be 103 on recheck her pulse was 80.  She is able to complete 4 METS of physical activity.  I will send her preoperative cardiac evaluation to Dr. Devere and plan follow-up in 9 to 12 months..  Today she denies chest pain, shortness of breath, lower extremity edema, fatigue, palpitations, melena, hematuria, hemoptysis, diaphoresis, weakness, presyncope, syncope, orthopnea, and PND.    Home Medications    Prior to Admission medications   Medication Sig Start Date End Date Taking? Authorizing Provider   acetaminophen  (TYLENOL ) 500 MG tablet Take 1 tablet (500 mg total) by mouth every 6 (six) hours as needed for mild pain. 10/14/22   Rising, Asberry, PA-C  albuterol  (PROVENTIL  HFA;VENTOLIN  HFA) 108 (90 Base) MCG/ACT inhaler Inhale 2 puffs into the lungs every 6 (six) hours as needed for wheezing or shortness of breath. 05/10/16   Pearl Jenkins Lesches, NP  amLODipine  (NORVASC ) 10 MG tablet Take 10 mg by mouth daily.    [provider]  aspirin  EC 81 MG tablet Take 81 mg by mouth daily. Swallow whole.    [provider]  benzonatate  (TESSALON ) 100 MG capsule Take 100 mg by mouth 3 (three) times daily as needed for cough. 12/18/22   Leigh Lung, MD  calcium  acetate (PHOSLO ) 667 MG tablet Take 667 mg by mouth 3 (three) times daily with meals.    [provider]  cilostazol  (  PLETAL ) 50 MG tablet Take 50 mg by mouth 2 (two) times daily.    [provider]  clotrimazole  (LOTRIMIN ) 1 % external solution Apply topically 2 (two) times daily. Patient taking differently: Apply 1 Application topically 2 (two) times daily. 07/20/23   Regalado, Belkys A, MD  cyclobenzaprine  (FLEXERIL ) 10 MG tablet Take 1 tablet (10 mg total) by mouth 2 (two) times daily as needed for muscle spasms. 12/22/23   Mannie Fairy DASEN, DO  dexamethasone  (DECADRON ) 4 MG tablet Take 2 tablets (8 mg) daily for 3 days. Start the day after chemotherapy. 10/29/23   Sherrod Sherrod, MD  EPINEPHrine  (EPIPEN  2-PAK) 0.3 mg/0.3 mL DEVI Inject 0.3 mLs (0.3 mg total) into the muscle once as needed (for severe allergic reaction). CAll 911 immediately if you have to use this medicine Patient taking differently: Inject 0.3 mg into the muscle once as needed (for a severe allergic reaction- CALL 9-1-1 IMMEDIATELY IF USED). 11/26/12   Piepenbrink, Delon, PA-C  gabapentin  (NEURONTIN ) 300 MG capsule Take 300 mg by mouth at bedtime. 09/24/23   [provider]  guaiFENesin  (MUCINEX ) 600 MG 12 hr tablet Take 2 tablets (1,200 mg  total) by mouth 2 (two) times daily. Patient taking differently: Take 1,200 mg by mouth 2 (two) times daily as needed for to loosen phlegm. 07/20/23   Regalado, Belkys A, MD  ipratropium (ATROVENT ) 0.06 % nasal spray Place 2 sprays into the nose daily as needed for rhinitis. 09/24/23   [provider]  Lanolin Alcohol  WAX Apply topically. Patient not taking: Reported on 12/18/2023 09/24/23 12/23/23  [provider]  levothyroxine  (SYNTHROID ) 100 MCG tablet Take 1 tablet (100 mcg total) by mouth daily before breakfast. 11/12/23   Sherrod Sherrod, MD  lidocaine -prilocaine  (EMLA ) cream Apply 1 Application topically as needed. Patient taking differently: Apply 1 Application topically as needed (port). 10/30/23   Heilingoetter, Cassandra L, PA-C  loperamide  (IMODIUM ) 2 MG capsule Take 2 tabs by mouth with first loose stool, then 1 tab with each additional loose stool as needed. Do not exceed 8 tabs in a 24-hour period Patient taking differently: Take 2-4 mg by mouth as needed for diarrhea or loose stools. Take 2 tabs by mouth with first loose stool, then 1 tab with each additional loose stool as needed. Do not exceed 8 tabs in a 24-hour period 10/29/23   Sherrod Sherrod, MD  LORazepam  (ATIVAN ) 0.5 MG tablet Take 0.5 mg by mouth daily as needed for anxiety. 07/12/23   [provider]  magnesium  oxide (MAG-OX) 400 (240 Mg) MG tablet TAKE 1 TABLET BY MOUTH EVERY DAY 11/27/23   Heilingoetter, Cassandra L, PA-C  methocarbamol  (ROBAXIN ) 500 MG tablet Take 1 tablet (500 mg total) by mouth 2 (two) times daily. 12/01/23   Dasie Faden, MD  metoprolol  tartrate (LOPRESSOR ) 25 MG tablet Take 1 tablet by mouth 2 (two) times daily. 09/24/23   [provider]  MILK OF MAGNESIA 400 MG/5ML suspension Take 15-30 mLs by mouth daily as needed for mild constipation.    [provider]  montelukast  (SINGULAIR ) 10 MG tablet Take 10 mg by mouth daily.    [provider]  naloxone  (NARCAN ) 2  MG/2ML injection Inject 0.4 mg into the muscle as needed (OD). 09/24/23 12/23/23  [provider]  nicotine  (NICODERM CQ  - DOSED IN MG/24 HOURS) 21 mg/24hr patch Place 21 mg onto the skin daily.    [provider]  nicotine  polacrilex (NICORETTE ) 4 MG gum Take 4 mg by mouth  as needed for smoking cessation.    [provider]  nitroGLYCERIN  (NITROSTAT ) 0.4 MG SL tablet Place 1 tablet (0.4 mg total) under the tongue every 5 (five) minutes x 3 doses as needed for chest pain. 09/25/18   Hilty, Vinie BROCKS, MD  ondansetron  (ZOFRAN ) 8 MG tablet Take 1 tablet (8 mg total) by mouth every 8 (eight) hours as needed for nausea or vomiting. Start on the third day after chemotherapy. 10/29/23   Sherrod Sherrod, MD  oxyCODONE  (ROXICODONE ) 5 MG immediate release tablet Take 1 tablet (5 mg total) by mouth every 4 (four) hours as needed for severe pain (pain score 7-10). 12/22/23   Mannie Pac T, DO  oxyCODONE -acetaminophen  (PERCOCET/ROXICET) 5-325 MG tablet Take 1 tablet by mouth every 6 (six) hours as needed for severe pain (pain score 7-10). Patient not taking: Reported on 12/18/2023 12/01/23   Dasie Faden, MD  Potassium & Sodium Phosphates (PHOSPHORUS  W/SOD & POTASSIUM) 280-160-250 MG PACK TAKE 1 PACKAGE BY MOUTH 2 (TWO) TIMES DAILY. 12/04/23   Sherrod Sherrod, MD  prochlorperazine  (COMPAZINE ) 10 MG tablet Take 1 tablet (10 mg total) by mouth every 6 (six) hours as needed. 11/06/23   Heilingoetter, Cassandra L, PA-C  sodium chloride  1 g tablet TAKE 1 TABLET (1 G TOTAL) BY MOUTH DAILY. SPLIT 1 GRAM INTO TWO HALVES AND TAKE BY MOUTH ONCE A DAY Patient taking differently: Take 0.5 g by mouth in the morning and at bedtime. 12/02/23   Heilingoetter, Cassandra L, PA-C  temazepam  (RESTORIL ) 15 MG capsule Take 1 capsule (15 mg total) by mouth at bedtime as needed for sleep. 02/06/23   Heilingoetter, Cassandra L, PA-C  traMADol  (ULTRAM ) 50 MG tablet Take 50 mg by mouth every 6 (six) hours as needed (for  pain).    [provider]    Family History    Family History  Problem Relation Age of Onset   Hypertension Mother    Diabetes Mother    Cancer - Other Mother    Cancer Sister    Breast cancer Daughter    She indicated that her mother is alive. She indicated that her father is deceased. She indicated that the status of her sister is unknown. She indicated that the status of her daughter is unknown.  Social History    Social History   Socioeconomic History   Marital status: Married    Spouse name: Not on file   Number of children: Not on file   Years of education: Not on file   Highest education level: Not on file  Occupational History   Not on file  Tobacco Use   Smoking status: Former    Current packs/day: 0.50    Average packs/day: 0.5 packs/day for 25.0 years (12.5 ttl pk-yrs)    Types: Cigarettes   Smokeless tobacco: Never   Tobacco comments:    Currently on the Nicoderm patch.smokes a few  Vaping Use   Vaping status: Never Used  Substance and Sexual Activity   Alcohol  use: Not Currently   Drug use: No   Sexual activity: Not Currently  Other Topics Concern   Not on file  Social History Narrative   Not on file   Social Drivers of Health   Financial Resource Strain: Not on file  Food Insecurity: Low Risk  (09/12/2023)   Received from [facility]   Hunger Vital Sign    Within the past 12 months, you worried that your food would run out before you got money to buy  more: Never true    Within the past 12 months, the food you bought just didn't last and you didn't have money to get more. : Never true  Transportation Needs: No Transportation Needs (09/24/2023)   Received from [facility]   PRAPARE - Transportation    Lack of Transportation (Medical): No    Lack of Transportation (Non-Medical): No  Physical Activity: Not on file  Stress: Not on file  Social Connections: Socially Integrated (09/02/2023)   Social Connection and Isolation Panel     Frequency of Communication with Friends and Family: Three times a week    Frequency of Social Gatherings with Friends and Family: Once a week    Attends Religious Services: More than 4 times per year    Active Member of [employer] or Organizations: Yes    Attends Engineer, structural: More than 4 times per year    Marital Status: Married  Catering manager Violence: Not At Risk (09/02/2023)   Humiliation, Afraid, Rape, and Kick questionnaire    Fear of Current or Ex-Partner: No    Emotionally Abused: No    Physically Abused: No    Sexually Abused: No     Review of Systems    General:  No chills, fever, night sweats or weight changes.  Cardiovascular:  No chest pain, dyspnea on exertion, edema, orthopnea, palpitations, paroxysmal nocturnal dyspnea. Dermatological: No rash, lesions/masses Respiratory: No cough, dyspnea Urologic: No hematuria, dysuria Abdominal:   No nausea, vomiting, diarrhea, bright red blood per rectum, melena, or hematemesis Neurologic:  No visual changes, wkns, changes in mental status. All other systems reviewed and are otherwise negative except as noted above.  Physical Exam    VS:  BP 118/63   Pulse 80   Ht 5' 8 (1.727 m)   Wt 163 lb (73.9 kg)   SpO2 99%   BMI 24.78 kg/m  , BMI Body mass index is 24.78 kg/m. GEN: Well nourished, well developed, in no acute distress. HEENT: normal. Neck: Supple, no JVD, carotid bruits, or masses. Cardiac: RRR, no murmurs, rubs, or gallops. No clubbing, cyanosis, edema.  Radials/DP/PT 2+ and equal bilaterally.  Respiratory:  Respirations regular and unlabored, clear to auscultation bilaterally. GI: Soft, nontender, nondistended, BS + x 4. MS: no deformity or atrophy. Skin: warm and dry, no rash. Neuro:  Strength and sensation are intact. Psych: Normal affect.  Accessory Clinical Findings    Recent Labs: 12/03/2023: TSH 33.900 12/17/2023: ALT 11; Magnesium  1.7 12/22/2023: BUN 25; Creatinine, Ser 1.30; Hemoglobin  12.1; Platelets 437; Potassium 4.4; Sodium 133   Recent Lipid Panel    Component Value Date/Time   CHOL 231 (H) 03/30/2013 0929   TRIG 105 03/30/2013 0929   HDL 51 03/30/2013 0929   CHOLHDL 4.5 03/30/2013 0929   VLDL 21 03/30/2013 0929   LDLCALC 159 (H) 03/30/2013 0929         ECG personally reviewed by me today- EKG Interpretation Date/Time:  Tuesday December 24 2023 13:35:29 EDT Ventricular Rate:  103 PR Interval:  164 QRS Duration:  64 QT Interval:  330 QTC Calculation: 432 R Axis:   80  Text Interpretation: Sinus tachycardia When compared with ECG of 16-Jul-2023 16:43, PR interval has decreased Vent. rate has increased BY  45 BPM Confirmed by Emelia Hazy [phone]) on 12/24/2023 1:59:28 PM         Assessment & Plan   1.  Takotsubo cardiomyopathy-recovered EF.  No increased DOE or activity intolerance.  Staying somewhat physically active. Heart  healthy low-sodium diet Continue metoprolol , amlodipine  Maintain physical activity Daily weights  Essential hypertension-BP today 118/63. Maintain blood pressure log Continue amlodipine , metoprolol   DOE-stable.  Breathing improves with albuterol . Increase physical activity as tolerated Follows with PCP  Preoperative cardiac evaluation-Procedure:  transurethral resection of bladder tumor  Date of Surgery:  Clearance 12/25/23                                  Surgeon:  Dr. Devere Surgeon's Group or Practice Name:  [facility] Phone number:  [phone] Fax number:  [phone]      Primary Cardiologist: Vinie JAYSON Maxcy, MD  Chart reviewed as part of pre-operative protocol coverage. Given past medical history and time since last visit, based on ACC/AHA guidelines, Morgan Torres.   Her RCRI is moderate risk, 6.6% risk of major cardiac event.  She is able to complete greater than 4 METS of physical activity.  Patient  was advised that if she develops new symptoms prior to surgery to contact our office to arrange a follow-up appointment.  He verbalized understanding.  Regarding ASA therapy, we recommend continuation of ASA throughout the perioperative period. However, if the surgeon feels that cessation of ASA is required in the perioperative period, it may be stopped 5-7 days prior to surgery with a plan to resume it as soon as felt to be feasible from a surgical standpoint in the post-operative period.    I will route this recommendation to the requesting party via Epic fax function and remove from pre-op pool.  Disposition: Follow-up with Dr. Maxcy or me in 9-12 months.   Morgan HERO. Damyen Knoll NP-C     12/24/2023, 2:15 PM [facility] [address] Office [phone] Fax [phone]    I spent 14 minutes examining this patient, reviewing medications, and using patient centered shared decision making involving their cardiac care.   I spent  20 minutes reviewing past medical history,  medications, and prior cardiac tests.

## 2023-12-23 DIAGNOSIS — R5381 Other malaise: Secondary | ICD-10-CM | POA: Diagnosis not present

## 2023-12-24 ENCOUNTER — Inpatient Hospital Stay

## 2023-12-24 ENCOUNTER — Ambulatory Visit: Attending: Cardiology | Admitting: General Practice

## 2023-12-24 ENCOUNTER — Encounter: Payer: Self-pay | Admitting: General Practice

## 2023-12-24 ENCOUNTER — Ambulatory Visit

## 2023-12-24 VITALS — BP 118/63 | HR 80 | Ht 68.0 in | Wt 163.0 lb

## 2023-12-24 DIAGNOSIS — I5181 Takotsubo syndrome: Secondary | ICD-10-CM

## 2023-12-24 DIAGNOSIS — R0609 Other forms of dyspnea: Secondary | ICD-10-CM

## 2023-12-24 DIAGNOSIS — I10 Essential (primary) hypertension: Secondary | ICD-10-CM | POA: Diagnosis not present

## 2023-12-24 DIAGNOSIS — Z01810 Encounter for preprocedural cardiovascular examination: Secondary | ICD-10-CM | POA: Diagnosis not present

## 2023-12-24 NOTE — Patient Instructions (Signed)
 Medication Instructions:  Your physician recommends that you continue on your current medications as directed. Please refer to the Current Medication list given to you today.  *If you need a refill on your cardiac medications before your next appointment, please call your pharmacy*  Lab Work: NONE If you have labs (blood work) drawn today and your tests are completely normal, you will receive your results only by: MyChart Message (if you have MyChart) OR A paper copy in the mail If you have any lab test that is abnormal or we need to change your treatment, we will call you to review the results.  Testing/Procedures: NONE  Follow-Up: At [facility], you and your health needs are our priority.  As part of our continuing mission to provide you with exceptional heart care, our providers are all part of one team.  This team includes your primary Cardiologist (physician) and Advanced Practice Providers or APPs (Physician Assistants and Nurse Practitioners) who all work together to provide you with the care you need, when you need it.  Your next appointment:   9-12 month(s)  Provider:   Vinie JAYSON Maxcy, MD or Josefa Beauvais, NP  We recommend signing up for the patient portal called MyChart.  Sign up information is provided on this After Visit Summary.  MyChart is used to connect with patients for Virtual Visits (Telemedicine).  Patients are able to view lab/test results, encounter notes, upcoming appointments, etc.  Non-urgent messages can be sent to your provider as well.   To learn more about what you can do with MyChart, go to [email or web address].

## 2023-12-24 NOTE — Anesthesia Preprocedure Evaluation (Signed)
 Anesthesia Evaluation  Patient identified by MRN, date of birth, ID band Patient awake    Reviewed: Allergy & Precautions, H&P , NPO status , Patient's Chart, lab work & pertinent test results  Airway Mallampati: II   Neck ROM: full    Dental   Pulmonary asthma , sleep apnea , COPD, former smoker   breath sounds clear to auscultation       Cardiovascular hypertension, + CAD, + Past MI and + Peripheral Vascular Disease   Rhythm:regular Rate:Normal     Neuro/Psych  PSYCHIATRIC DISORDERS Anxiety Depression     Neuromuscular disease    GI/Hepatic   Endo/Other  Hypothyroidism    Renal/GU Renal InsufficiencyRenal disease     Musculoskeletal  (+) Arthritis ,  Fibromyalgia -  Abdominal   Peds  Hematology   Anesthesia Other Findings   Reproductive/Obstetrics                              Anesthesia Physical Anesthesia Plan  ASA: 3  Anesthesia Plan: General   Post-op Pain Management:    Induction: Intravenous  PONV Risk Score and Plan: 3 and Ondansetron , Dexamethasone  and Treatment may vary due to age or medical condition  Airway Management Planned: Oral ETT  Additional Equipment:   Intra-op Plan:   Post-operative Plan: Extubation in OR  Informed Consent: I have reviewed the patients History and Physical, chart, labs and discussed the procedure including the risks, benefits and alternatives for the proposed anesthesia with the patient or authorized representative who has indicated his/her understanding and acceptance.     Dental advisory given  Plan Discussed with: CRNA, Anesthesiologist and Surgeon  Anesthesia Plan Comments: (See PAT note 12/18/2023)        Anesthesia Quick Evaluation

## 2023-12-24 NOTE — Progress Notes (Signed)
 Anesthesia Chart Review   Case: [id number] Date/Time: 12/25/23 0945   Procedure: TURBT (TRANSURETHRAL RESECTION OF BLADDER TUMOR) - CYSTOSCOPY WITH TURBT (TRANSURETHRAL RESECTION OF BLADDER TUMOR)   Anesthesia type: General   Diagnosis: Malignant neoplasm of overlapping sites of bladder (HCC) [C67.8]   Pre-op diagnosis: BLADDER CANCER   Location: [facility] / [facility]   Surgeons: Devere Lonni Righter, MD       DISCUSSION:72 y.o. former smoker with h/o HTN, COPD, sleep apnea, moderate nonobstructive CAD by cath, Takotsubo cardiomyopathy with recovered EF, CKD, bladder cancer scheduled for above procedure 12/25/2023 with Dr. Lonni Devere.   Pt seen by cardiology 12/24/2023 for preoperative evaluation.  Per OV note, Chart reviewed as part of pre-operative protocol coverage. Given past medical history and time since last visit, based on ACC/AHA guidelines, Morgan Torres would be at acceptable risk for the planned procedure without further cardiovascular testing.    Her RCRI is moderate risk, 6.6% risk of major cardiac event.  She is able to complete greater than 4 METS of physical activity.   Patient was advised that if she develops new symptoms prior to surgery to contact our office to arrange a follow-up appointment.  He verbalized understanding.   Regarding ASA therapy, we recommend continuation of ASA throughout the perioperative period. However, if the surgeon feels that cessation of ASA is required in the perioperative period, it may be stopped 5-7 days prior to surgery with a plan to resume it as soon as felt to be feasible from a surgical standpoint in the post-operative period.  VS: BP (!) 143/56   Pulse 86   Temp 36.8 C (Oral)   Resp 18   Ht 5' 8 (1.727 m)   Wt 75.3 kg   SpO2 100%   BMI 25.24 kg/m   PROVIDERS: Leigh Lung, MD is PCP   Primary Cardiologist:  Vinie JAYSON Maxcy, MD  LABS: Labs reviewed: Acceptable for surgery. (all labs ordered are listed, but  only abnormal results are displayed)  Labs Reviewed  BASIC METABOLIC PANEL WITH GFR - Abnormal; Notable for the following components:      Result Value   Glucose, Bld 141 (*)    BUN 31 (*)    Creatinine, Ser 1.39 (*)    GFR, Estimated 40 (*)    All other components within normal limits  CBC - Abnormal; Notable for the following components:   RBC 3.32 (*)    Hemoglobin 10.5 (*)    HCT 34.0 (*)    MCV 102.4 (*)    Platelets 484 (*)    All other components within normal limits     IMAGES:   EKG:   CV: Myocardial Perfusion 01/31/2021  The left ventricular ejection fraction is hyperdynamic (>65%). Nuclear stress EF: 68%. There was no ST segment deviation noted during stress. The study is normal. This is a low risk study.   Normal stress nuclear study with no ischemia or infarction.  Gated ejection fraction 68% with normal wall motion.  Echo 02/02/2021 1. Left ventricular ejection fraction, by estimation, is 60 to 65%. The  left ventricle has normal function. The left ventricle has no regional  wall motion abnormalities. There is mild left ventricular hypertrophy.  Left ventricular diastolic parameters  were normal. The average left ventricular global longitudinal strain is  -18.4 %. The global longitudinal strain is normal.   2. Right ventricular systolic function is normal. The right ventricular  size is normal.   3. The mitral valve  is abnormal. Trivial mitral valve regurgitation. No  evidence of mitral stenosis.   4. The aortic valve is tricuspid. Aortic valve regurgitation is not  visualized. Mild to moderate aortic valve sclerosis/calcification is  present, without any evidence of aortic stenosis.   5. The inferior vena cava is normal in size with greater than 50%  respiratory variability, suggesting right atrial pressure of 3 mmHg.  Past Medical History:  Diagnosis Date   Anemia    Anxiety    Arthritis    hip, lumbar spine    Asthma    seasonal allergies     Bronchitis    Hx: of   Chronic kidney disease    COPD (chronic obstructive pulmonary disease) (HCC)    Coronary artery disease    Depression    Family history of adverse reaction to anesthesia    Sister hard to wake up   Fibromyalgia    Hypertension    Hypothyroidism    Lumbar herniated disc    Metastatic urothelial carcinoma (HCC)    Myocardial infarction (HCC) 06/25/2012   followed by Dr. Mona, treated medically, no stents   Peripheral arterial disease (HCC)    of right foot   Pneumonia    Hx: of yrs ago   Sciatica    Sleep apnea    Small bowel obstruction (HCC)    several yrs ago    Past Surgical History:  Procedure Laterality Date   ABDOMINAL SURGERY     resection of small intestine   ANTERIOR CRUCIATE LIGAMENT REPAIR Bilateral    BACK SURGERY     fusion of lumbar   CARDIAC CATHETERIZATION  06/25/2012   COLON SURGERY     resection for bowel - for obstruction  6 to 7 yrs ago per pt on 01-05-2022   COLONOSCOPY     Hx; of   DILATION AND CURETTAGE OF UTERUS     EYE SURGERY Bilateral    cataracts removed   HERNIA REPAIR  02/23/1969   inguinal hernia    IR IMAGING GUIDED PORT INSERTION  06/07/2022   LEFT HEART CATHETERIZATION WITH CORONARY ANGIOGRAM N/A 03/30/2013   Procedure: LEFT HEART CATHETERIZATION WITH CORONARY ANGIOGRAM;  Surgeon: Alm LELON Clay, MD;  Location[facility] CATH LAB;  Service: Cardiovascular;  Laterality: N/A;   left thumb surgery     yrs ago   ROBOT ASSITED LAPAROSCOPIC NEPHROURETERECTOMY Left 03/09/2021   Procedure: XI ROBOT ASSITED LAPAROSCOPIC NEPHROURETERECTOMY/ CYSTOSCOPY WITH LEFT URETEROSCOPY WITH TRANSURETHRAL RESECTION OF URETERAL ORIFICE;  Surgeon: Devere Lonni Righter, MD;  Location: [facility];  Service: Urology;  Laterality: Left;   SALIVARY GLAND SURGERY Left    approached from inside mouth & side of neck 1970-1980   TONSILLECTOMY     as child   TOTAL HIP ARTHROPLASTY Left 05/28/2014   Procedure: LEFT TOTAL HIP ARTHROPLASTY;  Surgeon:  LELON JONETTA Shari Mickey., MD;  Location: [facility];  Service: Orthopedics;  Laterality: Left;   TRANSURETHRAL RESECTION OF BLADDER TUMOR N/A 08/23/2021   Procedure: TRANSURETHRAL RESECTION OF BLADDER TUMOR (TURBT) WITH CYSTOSCOPY/ POSTOPERATIVE INSTILLATION OF GEMCITABINE /retrograde pyelogram and stent placement (right);  Surgeon: Devere Lonni Righter, MD;  Location: [facility];  Service: Urology;  Laterality: N/A;   TRANSURETHRAL RESECTION OF BLADDER TUMOR N/A 01/10/2022   Procedure: TRANSURETHRAL RESECTION OF BLADDER TUMOR (TURBT) WITH CYSTOSCOPY / GEMCITABINE  INSTILLATION post-operatively;  Surgeon: Devere Lonni Righter, MD;  Location: [facility];  Service: Urology;  Laterality: N/A;  ONLY NEEDS 30 MIN  MEDICATIONS:  acetaminophen  (TYLENOL ) 500 MG tablet   albuterol  (PROVENTIL  HFA;VENTOLIN  HFA) 108 (90 Base) MCG/ACT inhaler   amLODipine  (NORVASC ) 10 MG tablet   aspirin  EC 81 MG tablet   benzonatate  (TESSALON ) 100 MG capsule   calcium  acetate (PHOSLO ) 667 MG tablet   cilostazol  (PLETAL ) 50 MG tablet   clotrimazole  (LOTRIMIN ) 1 % external solution   cyclobenzaprine  (FLEXERIL ) 10 MG tablet   dexamethasone  (DECADRON ) 4 MG tablet   EPINEPHrine  (EPIPEN  2-PAK) 0.3 mg/0.3 mL DEVI   gabapentin  (NEURONTIN ) 300 MG capsule   guaiFENesin  (MUCINEX ) 600 MG 12 hr tablet   ipratropium (ATROVENT ) 0.06 % nasal spray   Lanolin Alcohol  WAX   levothyroxine  (SYNTHROID ) 100 MCG tablet   lidocaine -prilocaine  (EMLA ) cream   loperamide  (IMODIUM ) 2 MG capsule   LORazepam  (ATIVAN ) 0.5 MG tablet   magnesium  oxide (MAG-OX) 400 (240 Mg) MG tablet   methocarbamol  (ROBAXIN ) 500 MG tablet   metoprolol  tartrate (LOPRESSOR ) 25 MG tablet   MILK OF MAGNESIA 400 MG/5ML suspension   montelukast  (SINGULAIR ) 10 MG tablet   naloxone  (NARCAN ) 2 MG/2ML injection   nicotine  (NICODERM CQ  - DOSED IN MG/24 HOURS) 21 mg/24hr patch   nicotine  polacrilex (NICORETTE ) 4 MG gum   nitroGLYCERIN   (NITROSTAT ) 0.4 MG SL tablet   ondansetron  (ZOFRAN ) 8 MG tablet   oxyCODONE  (ROXICODONE ) 5 MG immediate release tablet   oxyCODONE -acetaminophen  (PERCOCET/ROXICET) 5-325 MG tablet   Potassium & Sodium Phosphates (PHOSPHORUS  W/SOD & POTASSIUM) 280-160-250 MG PACK   prochlorperazine  (COMPAZINE ) 10 MG tablet   sodium chloride  1 g tablet   temazepam  (RESTORIL ) 15 MG capsule   traMADol  (ULTRAM ) 50 MG tablet   No current facility-administered medications for this encounter.    Harlene Hoots Ward, PA-C [facility] [phone]

## 2023-12-25 ENCOUNTER — Ambulatory Visit (HOSPITAL_COMMUNITY): Admitting: Anesthesiology

## 2023-12-25 ENCOUNTER — Encounter (HOSPITAL_COMMUNITY): Payer: Self-pay | Admitting: Urology

## 2023-12-25 ENCOUNTER — Ambulatory Visit (HOSPITAL_COMMUNITY)

## 2023-12-25 ENCOUNTER — Encounter (HOSPITAL_COMMUNITY): Admitting: Physician Assistant

## 2023-12-25 ENCOUNTER — Ambulatory Visit (HOSPITAL_COMMUNITY)
Admission: RE | Admit: 2023-12-25 | Discharge: 2023-12-25 | Disposition: A | Discharge status: Home / Self Care | Attending: Urology | Admitting: Urology

## 2023-12-25 ENCOUNTER — Encounter (HOSPITAL_COMMUNITY)
Admission: RE | Disposition: A | Discharge status: Home / Self Care | Payer: Self-pay | Source: Home / Self Care | Attending: Urology

## 2023-12-25 DIAGNOSIS — C678 Malignant neoplasm of overlapping sites of bladder: Secondary | ICD-10-CM | POA: Diagnosis present

## 2023-12-25 DIAGNOSIS — N1831 Chronic kidney disease, stage 3a: Secondary | ICD-10-CM

## 2023-12-25 DIAGNOSIS — I252 Old myocardial infarction: Secondary | ICD-10-CM | POA: Diagnosis not present

## 2023-12-25 DIAGNOSIS — Z79899 Other long term (current) drug therapy: Secondary | ICD-10-CM | POA: Diagnosis not present

## 2023-12-25 DIAGNOSIS — F17200 Nicotine dependence, unspecified, uncomplicated: Secondary | ICD-10-CM | POA: Diagnosis not present

## 2023-12-25 DIAGNOSIS — M797 Fibromyalgia: Secondary | ICD-10-CM | POA: Diagnosis not present

## 2023-12-25 DIAGNOSIS — Z906 Acquired absence of other parts of urinary tract: Secondary | ICD-10-CM | POA: Insufficient documentation

## 2023-12-25 DIAGNOSIS — D494 Neoplasm of unspecified behavior of bladder: Secondary | ICD-10-CM | POA: Diagnosis not present

## 2023-12-25 DIAGNOSIS — G4730 Sleep apnea, unspecified: Secondary | ICD-10-CM | POA: Diagnosis not present

## 2023-12-25 DIAGNOSIS — I129 Hypertensive chronic kidney disease with stage 1 through stage 4 chronic kidney disease, or unspecified chronic kidney disease: Secondary | ICD-10-CM | POA: Insufficient documentation

## 2023-12-25 DIAGNOSIS — Z8553 Personal history of malignant neoplasm of renal pelvis: Secondary | ICD-10-CM | POA: Diagnosis not present

## 2023-12-25 DIAGNOSIS — J439 Emphysema, unspecified: Secondary | ICD-10-CM | POA: Diagnosis not present

## 2023-12-25 DIAGNOSIS — M1990 Unspecified osteoarthritis, unspecified site: Secondary | ICD-10-CM | POA: Diagnosis not present

## 2023-12-25 DIAGNOSIS — C7801 Secondary malignant neoplasm of right lung: Secondary | ICD-10-CM | POA: Diagnosis not present

## 2023-12-25 DIAGNOSIS — C675 Malignant neoplasm of bladder neck: Secondary | ICD-10-CM | POA: Insufficient documentation

## 2023-12-25 DIAGNOSIS — I739 Peripheral vascular disease, unspecified: Secondary | ICD-10-CM | POA: Insufficient documentation

## 2023-12-25 DIAGNOSIS — I2510 Atherosclerotic heart disease of native coronary artery without angina pectoris: Secondary | ICD-10-CM

## 2023-12-25 DIAGNOSIS — N183 Chronic kidney disease, stage 3 unspecified: Secondary | ICD-10-CM | POA: Diagnosis not present

## 2023-12-25 DIAGNOSIS — Z7982 Long term (current) use of aspirin: Secondary | ICD-10-CM | POA: Diagnosis not present

## 2023-12-25 DIAGNOSIS — Z905 Acquired absence of kidney: Secondary | ICD-10-CM | POA: Insufficient documentation

## 2023-12-25 DIAGNOSIS — F32A Depression, unspecified: Secondary | ICD-10-CM | POA: Insufficient documentation

## 2023-12-25 DIAGNOSIS — Z7951 Long term (current) use of inhaled steroids: Secondary | ICD-10-CM | POA: Diagnosis not present

## 2023-12-25 DIAGNOSIS — F419 Anxiety disorder, unspecified: Secondary | ICD-10-CM | POA: Insufficient documentation

## 2023-12-25 DIAGNOSIS — E039 Hypothyroidism, unspecified: Secondary | ICD-10-CM | POA: Diagnosis not present

## 2023-12-25 DIAGNOSIS — I700 Atherosclerosis of aorta: Secondary | ICD-10-CM | POA: Diagnosis not present

## 2023-12-25 DIAGNOSIS — R590 Localized enlarged lymph nodes: Secondary | ICD-10-CM | POA: Diagnosis not present

## 2023-12-25 DIAGNOSIS — C679 Malignant neoplasm of bladder, unspecified: Secondary | ICD-10-CM

## 2023-12-25 HISTORY — PX: TRANSURETHRAL RESECTION OF BLADDER TUMOR: SHX2575

## 2023-12-25 SURGERY — TURBT (TRANSURETHRAL RESECTION OF BLADDER TUMOR)
Anesthesia: General

## 2023-12-25 MED ORDER — PHENYLEPHRINE 80 MCG/ML (10ML) SYRINGE FOR IV PUSH (FOR BLOOD PRESSURE SUPPORT)
PREFILLED_SYRINGE | INTRAVENOUS | Status: DC | PRN
  Administered 2023-12-25: 160 ug via INTRAVENOUS
  Administered 2023-12-25: 80 ug via INTRAVENOUS

## 2023-12-25 MED ORDER — OXYBUTYNIN CHLORIDE 5 MG PO TABS: 5.0000 mg | ORAL_TABLET | Freq: Three times a day (TID) | ORAL | 1 refills | Status: DC | PRN

## 2023-12-25 MED ORDER — PHENAZOPYRIDINE HCL 200 MG PO TABS: 200.0000 mg | ORAL_TABLET | Freq: Three times a day (TID) | ORAL | 0 refills | Status: AC | PRN

## 2023-12-25 MED ORDER — LIDOCAINE HCL (PF) 2 % IJ SOLN
INTRAMUSCULAR | Status: AC
  Filled 2023-12-25: qty 5

## 2023-12-25 MED ORDER — DEXAMETHASONE SODIUM PHOSPHATE 10 MG/ML IJ SOLN
INTRAMUSCULAR | Status: AC
  Filled 2023-12-25: qty 1

## 2023-12-25 MED ORDER — SUGAMMADEX SODIUM 200 MG/2ML IV SOLN
INTRAVENOUS | Status: DC | PRN
  Administered 2023-12-25: 200 mg via INTRAVENOUS

## 2023-12-25 MED ORDER — FENTANYL CITRATE PF 50 MCG/ML IJ SOSY: 25.0000 ug | PREFILLED_SYRINGE | INTRAMUSCULAR | Status: DC | PRN

## 2023-12-25 MED ORDER — CHLORHEXIDINE GLUCONATE 0.12 % MT SOLN
15.0000 mL | Freq: Once | OROMUCOSAL | Status: AC
  Administered 2023-12-25: 15 mL via OROMUCOSAL

## 2023-12-25 MED ORDER — LACTATED RINGERS IV SOLN: INTRAVENOUS | Status: DC | PRN

## 2023-12-25 MED ORDER — ONDANSETRON HCL 4 MG/2ML IJ SOLN
INTRAMUSCULAR | Status: DC | PRN
  Administered 2023-12-25: 4 mg via INTRAVENOUS

## 2023-12-25 MED ORDER — ONDANSETRON HCL 4 MG/2ML IJ SOLN
INTRAMUSCULAR | Status: AC
Start: 2023-12-25 — End: 2023-12-25
  Filled 2023-12-25: qty 2

## 2023-12-25 MED ORDER — LACTATED RINGERS IV SOLN: INTRAVENOUS | Status: DC

## 2023-12-25 MED ORDER — PROPOFOL 10 MG/ML IV BOLUS
INTRAVENOUS | Status: AC
  Filled 2023-12-25: qty 20

## 2023-12-25 MED ORDER — CEFAZOLIN SODIUM-DEXTROSE 2-4 GM/100ML-% IV SOLN
2.0000 g | INTRAVENOUS | Status: AC
  Administered 2023-12-25: 2 g via INTRAVENOUS
  Filled 2023-12-25: qty 100

## 2023-12-25 MED ORDER — ONDANSETRON HCL 4 MG/2ML IJ SOLN: 4.0000 mg | Freq: Four times a day (QID) | INTRAMUSCULAR | Status: DC | PRN

## 2023-12-25 MED ORDER — SODIUM CHLORIDE 0.9 % IR SOLN
Status: DC | PRN
  Administered 2023-12-25: 3000 mL via INTRAVESICAL

## 2023-12-25 MED ORDER — OXYCODONE HCL 5 MG/5ML PO SOLN: 5.0000 mg | Freq: Once | ORAL | Status: AC | PRN

## 2023-12-25 MED ORDER — ORAL CARE MOUTH RINSE: 15.0000 mL | Freq: Once | OROMUCOSAL | Status: AC

## 2023-12-25 MED ORDER — DEXAMETHASONE SODIUM PHOSPHATE 10 MG/ML IJ SOLN
INTRAMUSCULAR | Status: DC | PRN
  Administered 2023-12-25: 8 mg via INTRAVENOUS

## 2023-12-25 MED ORDER — FENTANYL CITRATE (PF) 100 MCG/2ML IJ SOLN
INTRAMUSCULAR | Status: AC
  Filled 2023-12-25: qty 2

## 2023-12-25 MED ORDER — LIDOCAINE HCL (CARDIAC) PF 100 MG/5ML IV SOSY
PREFILLED_SYRINGE | INTRAVENOUS | Status: DC | PRN
  Administered 2023-12-25: 60 mg via INTRAVENOUS

## 2023-12-25 MED ORDER — OXYCODONE HCL 5 MG PO TABS
5.0000 mg | ORAL_TABLET | Freq: Once | ORAL | Status: AC | PRN
  Administered 2023-12-25: 5 mg via ORAL

## 2023-12-25 MED ORDER — FENTANYL CITRATE (PF) 100 MCG/2ML IJ SOLN
INTRAMUSCULAR | Status: DC | PRN
  Administered 2023-12-25 (×2): 50 ug via INTRAVENOUS

## 2023-12-25 MED ORDER — OXYCODONE HCL 5 MG PO TABS
ORAL_TABLET | ORAL | Status: AC
  Filled 2023-12-25: qty 1

## 2023-12-25 MED ORDER — ROCURONIUM BROMIDE 100 MG/10ML IV SOLN
INTRAVENOUS | Status: DC | PRN
  Administered 2023-12-25: 50 mg via INTRAVENOUS

## 2023-12-25 MED ORDER — PROPOFOL 10 MG/ML IV BOLUS
INTRAVENOUS | Status: DC | PRN
  Administered 2023-12-25: 100 mg via INTRAVENOUS

## 2023-12-25 SURGICAL SUPPLY — 13 items
BAG URINE DRAIN 2000ML AR STRL (UROLOGICAL SUPPLIES) IMPLANT
BAG URO CATCHER STRL LF (MISCELLANEOUS) ×1 IMPLANT
DRAPE FOOT SWITCH (DRAPES) ×1 IMPLANT
ELECT REM PT RETURN 15FT ADLT (MISCELLANEOUS) ×1 IMPLANT
GLOVE SURG LX STRL 8.0 MICRO (GLOVE) ×1 IMPLANT
GOWN STRL SURGICAL XL XLNG (GOWN DISPOSABLE) ×1 IMPLANT
KIT TURNOVER KIT A (KITS) ×1 IMPLANT
LOOP CUT BIPOLAR 24F LRG (ELECTROSURGICAL) IMPLANT
MANIFOLD NEPTUNE II (INSTRUMENTS) ×1 IMPLANT
PACK CYSTO (CUSTOM PROCEDURE TRAY) ×1 IMPLANT
SYRINGE TOOMEY IRRIG 70ML (MISCELLANEOUS) IMPLANT
TUBING CONNECTING 10 (TUBING) ×1 IMPLANT
TUBING UROLOGY SET (TUBING) ×1 IMPLANT

## 2023-12-25 NOTE — Op Note (Signed)
 Operative Note  Preoperative diagnosis:  1.  Metastatic urothelial carcinoma 2.  Recurrent 2 cm bladder neck tumor  Postoperative diagnosis: Same  Procedure(s): 1.  Cystoscopy with TURBT (medium)  Surgeon: Lonni Han, MD  Assistants:  None  Anesthesia:  General  Complications:  None  EBL: Less than 5 mL  Specimens: 1.  Anterior bladder neck tumor  Drains/Catheters: 1.  None  Intraoperative findings:   2 cm papillary bladder tumor involving the anterior bladder neck.  No other intravesical or urethral abnormalities were seen  Indication:  Morgan Torres is a 73 y.o. female with a history of metastatic urothelial carcinoma, s/p left robotic nephroureterectomy in 2022.  She also has a history of recurrent urothelial carcinoma of the bladder.  Description of procedure:  After informed consent was obtained, the patient was brought to the operating room and general endotracheal anesthesia was administered. The patient was then placed in the dorsolithotomy position and prepped and draped in the usual sterile fashion. A timeout was performed. A 21 French rigid cystoscope was then inserted into the urethral meatus and advanced into the bladder under direct vision. A complete bladder survey revealed a 2 cm anterior bladder neck tumor with features concerning for urothelial carcinoma.    The rigid cystoscope was then exchanged for a 26 French resectoscope with a bipolar loop working element.  The anterior bladder neck tumor was then resected down to the detrusor musculature.  All specimens were evacuated from the lumen of the bladder through the sheath of the resectoscope and sent for permanent section.  The area of resection was then extensively fulgurated until hemostasis was achieved.  No other intravesical or urethral abnormalities were seen.  The patient tolerated the procedure well and was transferred to the postanesthesia in stable condition.  Plan: Discharge home

## 2023-12-25 NOTE — H&P (Signed)
 Office Visit Report     12/05/2023   --------------------------------------------------------------------------------   Morgan Torres  MRN: [id number]  DOB: 03-29-1951, 73 year old Female  PRIMARY CARE:  Elna Potters, MD  PRIMARY CARE FAX:  [phone]  REFERRING:  Lonni LABOR. Devere, MD  PROVIDER:  Lonni Devere, M.D.  LOCATION:  [facility] 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 She was found to have multiple papillary bladder tumors on surveillance cystoscopy in January 2023 and is status post TURBT with gemcitabine  instillation on 08/23/2021. TURBT path showed HG Ta UCC. Induction BCG completed 11/14/21. She subsequently had another bladder tumor recurrence requiring a 2nd TURBT with gemcitabine  instillation on 01/10/22, which revealed low grade UCC.   03/17/21: The patient is here today for routine postop visit. She reports a 4 to 5-day history of abdominal bloating and discomfort. She admits that she has not had a bowel movement for several days. She passed a small bowel movement while in the hospital, but has only since passed a hard, round and firm stool since then. She denies nausea/vomiting, fever/chills or incisional pain. She is passing flatus. She reports serous discharge from her lateral port incision, which is where her JP drain exited. Otherwise, she is ambulating without difficulty and has no urinary complaints today. Denies hematuria or dysuria.   04/21/2021: Back today for follow-up visit. Doing well from a urological perspective. Her issues with abdominal bloating and discomfort as well as constipation have all grossly resolved. She is no longer having any significant or notable pain and discomfort in the left flank, lower back or  abdomen. Voiding symptoms grossly stable. No changes in baseline frequency/urgency, interval burning or dysuria, no interval gross hematuria. She has had no further issues with wound healing including resolution of drainage from port sites. No interval bleeding from the affected areas. Unfortunately the patient has been having numbness in her right fifth digit as well as numbness in the right foot. Primary care has referred her to heart and vascular for evaluation which she has an appointment with him early next week. Recent laboratory data assess showed an improvement in creatinine from last assessment here. Decreased from 1.4-1.3 assessed earlier this week. She denies interval fevers or chills, nausea/vomiting. She continues to have fatigue and poor appetite but is trying to stay hydrated and nourished to the best of her ability. She denies interval chest pain, shortness of breath, dyspnea upon exertion.   07/21/21: The patient is here today for a routine follow-up. Recent surveillance CT urogram showed no clear evidence of metastatic disease. She was noted to have some indeterminate left retroperitoneal lymph nodes. She is urinating without difficulty and denies interval UTIs, dysuria or episodes of gross hematuria.   08/17/2021: 73 year old female who presents today for preoperative appointment prior to undergoing a TURBT. She is trying to quit smoking but has not been successful at this. She has a daughter that is undergoing chemotherapy for breast cancer, she is quite tearful today and has some questions regarding her upcoming surgery. 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 She is wondering if she will be able to work postoperatively. She denies interval fevers and chills. No complaints of shortness of breath or chest pain.   09/01/21: The patient is here today for  routine follow-up after TURBT on 08/23/2021, which showed Ta noninvasive high-grade UCC (muscularis was present and  uninvolved). She had a right ureteral stent placed due to the close proximity of one of her small tumors, but the stent was removed prematurely after the string was inadvertently pulled. She is urinating without difficulty and denies flank pain, dysuria or hematuria. She does report soreness around her urethra, but states that it is progressively improving.   01/04/22: The patient is here today for a routine follow-up and surveillance cystoscopy after completing induction BCG. Her recent surveillance CT scan showed a 5 mm left subpleural nodule with no other signs of metastatic disease. She states that she tolerated BCG treatments well and denies interval UTIs, dysuria or gross hematuria.   01/18/22: Morgan Torres is here today for routine postop visit following her recent TURBT, which revealed low-grade TA UCC (muscularis was present and uninvolved). She is urinating without difficulty and denies residual hematuria or dysuria.   04/20/2022: The patient is here today for routine follow-up and surveillance cystoscopy. She recently completed her second round of induction BCG, which she tolerated well. She denies interval episodes of dysuria, hematuria or UTIs.   08/08/2022: The patient is here today for routine follow-up and surveillance cystoscopy. She is currently going through salvage chemotherapy by carboplatinum and gemcitabine  with Dr. Sherrod. She is tolerating her treatments well. She recently completed her third round of maintenance BCG and had no issues during that treatment cycle either. She denies interval UTIs, dysuria or hematuria. She has surveillance imaging scheduled later this month   11/16/2022: The patient is here today for routine follow-up and surveillance cystoscopy. She has completed 6 cycles of carboplatin  and gemcitabine  and is now on Azelumab every 2 weeks for a year. She is tolerating her treatments well and her only complaint is chronic back pain. Surveillance CT scan from 11/05/2022 showed  stable metastatic burden in her chest with no signs of abdominal mets.   05/17/2023: The patient is here today for a routine follow-up and surveillance cystoscopy. CT chest/abdomen/pelvis from 04/14/2023 showed no evidence of metastatic urothelial carcinoma recurrence within the abdomen or pelvis with no abnormalities involving her right kidney. She was noted to have reduction in the size of her multiple pulmonary nodules with no new or progressive lesions. She is tolerating salvage therapy with erdafitinib  reasonably well and reports ongoing anorexia and peripheral neuropathy. No urinary complaints today. She denies interval UTIs, dysuria or hematuria.   12/05/23: The patient is here today for a routine follow-up and surveillance cystoscopy. She is currently being treated with sacituzumab govitecan  to address her metastatic UCC. CT chest from April showed progression in size of her multiple pulmonary nodules. She was seen in the ER on 12/01/23 due to low back pain that radiated into her right LE--plain films were unremarkable. She denies flank pain, dysuria or interval episodes of gross hematuria. UA today is clear.     ALLERGIES: Codeine NSAIDs    MEDICATIONS: Levothyroxine  Sodium 88 MCG Capsule  Levothyroxine  Sodium 50 MCG Capsule  Metoprolol  Tartrate 25 MG Tablet  oxyBUTYnin  Chloride 5 MG Tablet  Albuterol  Sulfate  Amlodipine  Besilate  Aspirin  81 MG Tablet Delayed Release  Atorvastatin  Calcium  20 MG Tablet  Calcium  Acetate  Cilostazol   Compazine   Cyclobenzaprine  HCl  EPINEPHrine  0.3 MG/0.3ML Solution Auto-injector  Erdafitnib  Gabapentin   Hydrocodone-Acetaminophen   LORazepam  1 MG Tablet  Losartan  Potassium  Medrol  4 MG Tablet Therapy Pack  Nicotine  Patch  Nitroglycerin   Sodium Chloride   Temazepam  15 MG  Capsule  traMADol  HCl 50 MG Tablet  Wellbutrin Sr     GU PSH: Bladder Instill AntiCA Agent - 04/03/2022, 03/27/2022, 03/19/2022, 03/13/2022, 03/06/2022, 02/27/2022, 01/10/2022, 2023,  2023, 2023, 2023, 2023, 2023, 2023 Cystoscopy - 05/17/2023, 11/16/2022, 08/08/2022, 04/20/2022, 01/04/2022, 2023 Cystoscopy Insert Stent - 2023, 2022 Cystoscopy TURBT <2 cm - 01/10/2022, 2023 Lap Nephro Ureterectomy - 2022 Locm 300-399Mg /Ml Iodine,1Ml - 05/08/2022, 01/01/2022, 2023     NON-GU PSH: Visit Complexity (formerly GPC1X) - 05/17/2023, 11/16/2022, 08/08/2022     GU PMH: History of bladder cancer - 05/17/2023, - 11/16/2022, - 08/08/2022, - 04/20/2022 Renal pelvis cancer, left - 05/17/2023, - 08/08/2022, - 05/08/2022, - 04/20/2022, - 01/18/2022, - 01/04/2022, - 01/01/2022, - 2023, - 2023, - 2023, - 2022, - 2022, - 2022 Chronic kidney disease stage 3 (GFR 30-60) - 04/20/2022 Bladder Cancer overlapping sites - 04/03/2022, - 03/27/2022, - 03/19/2022, - 03/13/2022, - 03/06/2022, - 02/27/2022, - 01/18/2022, - 01/04/2022, - 2023, - 2023, - 2023, - 2023, - 2023, - 2023, - 2023, - 2023, - 2023 Gross hematuria - 2022, - 2022 Left renal neoplasm - 2022      PMH Notes: Renal ultrasound 11/22/17 revealed normal kidneys bilaterally without mass, stones or hydronephrosis.  Heart attack   NON-GU PMH: Secondary malignant neoplasm left lung - 05/17/2023 Secondary malignant neoplasm right lung - 05/17/2023 Solitary pulmonary nodule - 01/04/2022 Anxiety Asthma Hypertension Sleep Apnea    FAMILY HISTORY: 1 Daughter - Daughter 1 son - Son Death In The Family Mother - Mother Kidney Cancer - Sister   SOCIAL HISTORY: Marital Status: Married Preferred Language: English; Ethnicity: Not Hispanic Or Latino; Race: Black or African American Current Smoking Status: Patient smokes. Has smoked since 12/24/1990. Smokes 1 pack per day.   Tobacco Use Assessment Completed: Used Tobacco in last 30 days? Has never drank.  Drinks 2 caffeinated drinks per day.    REVIEW OF SYSTEMS:    GU Review Female:   Patient denies frequent urination, hard to postpone urination, burning /pain with urination, get up at night to urinate,  leakage of urine, stream starts and stops, trouble starting your stream, have to strain to urinate, and being pregnant.  Gastrointestinal (Upper):   Patient denies nausea, vomiting, and indigestion/ heartburn.  Gastrointestinal (Lower):   Patient denies diarrhea and constipation.  Constitutional:   Patient denies fever, night sweats, weight loss, and fatigue.  Skin:   Patient denies skin rash/ lesion and itching.  Eyes:   Patient denies blurred vision and double vision.  Ears/ Nose/ Throat:   Patient denies sore throat and sinus problems.  Hematologic/Lymphatic:   Patient denies swollen glands and easy bruising.  Cardiovascular:   Patient denies leg swelling and chest pains.  Respiratory:   Patient denies cough and shortness of breath.  Endocrine:   Patient denies excessive thirst.  Musculoskeletal:   Patient reports back pain. Patient denies joint pain.  Neurological:   Patient denies headaches and dizziness.  Psychologic:   Patient denies depression and anxiety.   VITAL SIGNS: None   GU PHYSICAL EXAMINATION:    Urethral Meatus: Normal size. Normal position. No discharge.  Urethra: No tenderness, no mass, no scarring. No hypermobility. No leakage.  Bladder: Normal to palpation, no tenderness, no mass, normal size.   MULTI-SYSTEM PHYSICAL EXAMINATION:    Constitutional: Well-nourished. No physical deformities. Normally developed. Good grooming.  Neurologic / Psychiatric: Oriented to time, oriented to place, oriented to person. No depression, no anxiety, no agitation.     Complexity of Data:  Records Review:   Previous Patient Records  X-Ray Review: C.T. Chest: Reviewed Films. Reviewed Report. Discussed With Patient. CLINICAL DATA: Metastatic bladder cancer. Mediastinal and hilar  nodal metastasis and pulmonary metastasis on recent PET-CT. Assess  treatment response. * Tracking Code: BO *   EXAM:  CT CHEST WITHOUT CONTRAST   TECHNIQUE:  Multidetector CT imaging of the chest was  performed following the  standard protocol without IV contrast.   RADIATION DOSE REDUCTION: This exam was performed according to the  departmental dose-optimization program which includes automated  exposure control, adjustment of the mA and/or kV according to  patient size and/or use of iterative reconstruction technique.   COMPARISON: Chest CT 07/09/2023 and PET-CT 07/29/2023   FINDINGS:  Cardiovascular: The heart is normal in size. No pericardial  effusion. Stable severe/advanced aortic and coronary artery  calcifications. No aneurysm.   Mediastinum/Nodes: Extensive and progressive mediastinal  lymphadenopathy.   Upper pretracheal lymph node on image 44/2 measures 10 mm and  previously measured 6 mm.   Right paratracheal node on image 54/2 measures 22 mm and previously  measured 16 mm.   Prevascular node on image 57/2 measures 16 mm and previously  measured 10 mm.   AP window node measures 20 mm and previously measured 12 mm.   Subcarinal node measures 16 mm on image 78/2 and previously measured  13 mm.   The esophagus is grossly normal.   Lungs/Pleura: Stable underlying emphysematous changes and pulmonary  scarring.   19 mm superior segment left lower lobe pulmonary lesion on image  57/7 previously measured 15 mm.   Is a new finding.   Upper Abdomen: No significant upper abdominal findings. Stable  advanced vascular disease. No hepatic or adrenal gland lesions.   Musculoskeletal: No significant bony findings.   IMPRESSION:  1. Extensive and progressive mediastinal lymphadenopathy.  2. Enlarging and new pulmonary nodules.  3. Stable underlying emphysematous changes and pulmonary scarring.  4. Stable advanced vascular disease.   Aortic Atherosclerosis (ICD10-I70.0) and Emphysema (ICD10-J43.9).    Electronically Signed  By: MYRTIS Stammer M.D.  On: 10/16/2023 15:17      PROCEDURES:         Flexible Cystoscopy - 52000  Risks, benefits, and some of the  potential complications of the procedure were discussed at length with the patient including infection, bleeding, voiding discomfort, urinary retention, fever, chills, sepsis, and others. All questions were answered. Informed consent was obtained. Antibiotic prophylaxis was given. Sterile technique and intraurethral analgesia were used.  Meatus:  Normal size. Normal location. Normal condition.  Urethra:  No hypermobility. No leakage.  Ureteral Orifices:  Normal location. Normal size. Normal shape. Effluxed clear urine.  Bladder:  2 cm tumor involving the anterior bladder neck. No trabeculation. Normal mucosa. No stones.      The lower urinary tract was carefully examined. The procedure was well-tolerated and without complications. Antibiotic instructions were given. Instructions were given to call the office immediately for bloody urine, difficulty urinating, urinary retention, painful or frequent urination, fever, chills, nausea, vomiting or other illness. The patient stated that she understood these instructions and would comply with them.         Visit Complexity - G2211          Urinalysis Dipstick Dipstick Cont'd  Color: Yellow Bilirubin: Neg mg/dL  Appearance: Clear Ketones: Neg mg/dL  Specific Gravity: 8.974 Blood: Neg ery/uL  pH: 6.0 Protein: Trace mg/dL  Glucose: Neg mg/dL Urobilinogen: 0.2 mg/dL    Nitrites: Neg  Leukocyte Esterase: Neg leu/uL    ASSESSMENT:      ICD-10 Details  1 GU:   Bladder Cancer overlapping sites - C67.8   2   Renal pelvis cancer, left - C65.2    PLAN:           Schedule Return Visit/Planned Activity: Next Available Appointment - Schedule Surgery          Document Letter(s):  Created for Patient: Clinical Summary   Created for Elna Potters, MD         Notes:   -Cystoscopy today revealed a 2 cm papillary bladder tumor involving the anterior bladder neck with features concerning for urothelial cell carcinoma recurrence.  -The risks, benefits  and alternatives of cystoscopy with TURBT was discussed with the patient. The risks include, but are not limited to, bleeding, urinary tract infection, bladder perforation requiring prolonged catheterization and/or open bladder repair, ureteral obstruction, voiding dysfunction and the inherent risks of general anesthesia. The patient voices understanding and wishes to proceed.

## 2023-12-25 NOTE — Anesthesia Procedure Notes (Signed)
 Procedure Name: Intubation Date/Time: 12/25/2023 10:41 AM  Performed by: Deeann Eva BROCKS, CRNAPre-anesthesia Checklist: Patient identified, Emergency Drugs available, Suction available and Patient being monitored Patient Re-evaluated:Patient Re-evaluated prior to induction Oxygen  Delivery Method: Circle System Utilized Preoxygenation: Pre-oxygenation with 100% oxygen  Induction Type: IV induction Ventilation: Mask ventilation without difficulty Laryngoscope Size: Mac and 3 Grade View: Grade II Tube type: Oral Tube size: 7.0 mm Number of attempts: 1 Airway Equipment and Method: Stylet and Oral airway Placement Confirmation: ETT inserted through vocal cords under direct vision, positive ETCO2 and breath sounds checked- equal and bilateral Secured at: 22 cm Tube secured with: Tape Dental Injury: Teeth and Oropharynx as per pre-operative assessment

## 2023-12-25 NOTE — Transfer of Care (Signed)
 Immediate Anesthesia Transfer of Care Note  Patient: Morgan Torres  Procedure(s) Performed: TURBT (TRANSURETHRAL RESECTION OF BLADDER TUMOR)  Patient Location: PACU  Anesthesia Type:General  Level of Consciousness: awake and patient cooperative  Airway & Oxygen  Therapy: Patient Spontanous Breathing and Patient connected to face mask  Post-op Assessment: Report given to RN and Post -op Vital signs reviewed and stable  Post vital signs: Reviewed and stable  Last Vitals:  Vitals Value Taken Time  BP 120/73 12/25/23 11:15  Temp    Pulse    Resp 14 12/25/23 11:16  SpO2    Vitals shown include unfiled device data.  Last Pain:  Vitals:   12/25/23 0722  TempSrc: Oral         Complications: No notable events documented.

## 2023-12-26 ENCOUNTER — Encounter (HOSPITAL_COMMUNITY): Payer: Self-pay | Admitting: Urology

## 2023-12-26 LAB — SURGICAL PATHOLOGY

## 2023-12-26 NOTE — Anesthesia Postprocedure Evaluation (Signed)
 Anesthesia Post Note  Patient: Morgan Torres  Procedure(s) Performed: TURBT (TRANSURETHRAL RESECTION OF BLADDER TUMOR)     Patient location during evaluation: PACU Anesthesia Type: General Level of consciousness: awake and alert Pain management: pain level controlled Vital Signs Assessment: post-procedure vital signs reviewed and stable Respiratory status: spontaneous breathing, nonlabored ventilation, respiratory function stable and patient connected to nasal cannula oxygen  Cardiovascular status: blood pressure returned to baseline and stable Postop Assessment: no apparent nausea or vomiting Anesthetic complications: no   No notable events documented.  Last Vitals:  Vitals:   12/25/23 1145 12/25/23 1146  BP: 123/71   Pulse: 75 81  Resp: 13   Temp:    SpO2: 93% 93%    Last Pain:  Vitals:   12/25/23 1215  TempSrc:   PainSc: 3                  Yaden Seith S

## 2024-01-02 ENCOUNTER — Ambulatory Visit (HOSPITAL_COMMUNITY)
Admission: RE | Admit: 2024-01-02 | Discharge: 2024-01-02 | Disposition: A | Discharge status: Home / Self Care | Source: Ambulatory Visit | Attending: Physician Assistant | Admitting: Physician Assistant

## 2024-01-02 DIAGNOSIS — C652 Malignant neoplasm of left renal pelvis: Secondary | ICD-10-CM | POA: Diagnosis not present

## 2024-01-02 DIAGNOSIS — C678 Malignant neoplasm of overlapping sites of bladder: Secondary | ICD-10-CM | POA: Diagnosis not present

## 2024-01-02 DIAGNOSIS — R918 Other nonspecific abnormal finding of lung field: Secondary | ICD-10-CM | POA: Diagnosis not present

## 2024-01-02 DIAGNOSIS — C679 Malignant neoplasm of bladder, unspecified: Secondary | ICD-10-CM | POA: Insufficient documentation

## 2024-01-02 DIAGNOSIS — C7800 Secondary malignant neoplasm of unspecified lung: Secondary | ICD-10-CM | POA: Diagnosis not present

## 2024-01-02 DIAGNOSIS — I700 Atherosclerosis of aorta: Secondary | ICD-10-CM | POA: Diagnosis not present

## 2024-01-03 ENCOUNTER — Other Ambulatory Visit: Payer: Self-pay

## 2024-01-04 ENCOUNTER — Other Ambulatory Visit: Payer: Self-pay

## 2024-01-04 DIAGNOSIS — Z79899 Other long term (current) drug therapy: Secondary | ICD-10-CM | POA: Diagnosis not present

## 2024-01-07 ENCOUNTER — Other Ambulatory Visit: Payer: Self-pay | Admitting: *Deleted

## 2024-01-07 ENCOUNTER — Other Ambulatory Visit: Payer: Self-pay | Admitting: Medical Oncology

## 2024-01-07 ENCOUNTER — Inpatient Hospital Stay: Attending: Internal Medicine | Admitting: Internal Medicine

## 2024-01-07 ENCOUNTER — Inpatient Hospital Stay

## 2024-01-07 ENCOUNTER — Telehealth: Payer: Self-pay | Admitting: Medical Oncology

## 2024-01-07 VITALS — BP 108/64 | HR 87 | Temp 97.7°F | Resp 17 | Ht 68.0 in | Wt 163.3 lb

## 2024-01-07 DIAGNOSIS — Z7982 Long term (current) use of aspirin: Secondary | ICD-10-CM | POA: Diagnosis not present

## 2024-01-07 DIAGNOSIS — Z881 Allergy status to other antibiotic agents status: Secondary | ICD-10-CM | POA: Insufficient documentation

## 2024-01-07 DIAGNOSIS — M8580 Other specified disorders of bone density and structure, unspecified site: Secondary | ICD-10-CM | POA: Diagnosis not present

## 2024-01-07 DIAGNOSIS — I129 Hypertensive chronic kidney disease with stage 1 through stage 4 chronic kidney disease, or unspecified chronic kidney disease: Secondary | ICD-10-CM | POA: Diagnosis not present

## 2024-01-07 DIAGNOSIS — M533 Sacrococcygeal disorders, not elsewhere classified: Secondary | ICD-10-CM | POA: Insufficient documentation

## 2024-01-07 DIAGNOSIS — I700 Atherosclerosis of aorta: Secondary | ICD-10-CM | POA: Diagnosis not present

## 2024-01-07 DIAGNOSIS — C679 Malignant neoplasm of bladder, unspecified: Secondary | ICD-10-CM | POA: Insufficient documentation

## 2024-01-07 DIAGNOSIS — R634 Abnormal weight loss: Secondary | ICD-10-CM | POA: Insufficient documentation

## 2024-01-07 DIAGNOSIS — C7800 Secondary malignant neoplasm of unspecified lung: Secondary | ICD-10-CM | POA: Diagnosis not present

## 2024-01-07 DIAGNOSIS — M47819 Spondylosis without myelopathy or radiculopathy, site unspecified: Secondary | ICD-10-CM | POA: Diagnosis not present

## 2024-01-07 DIAGNOSIS — J439 Emphysema, unspecified: Secondary | ICD-10-CM | POA: Diagnosis not present

## 2024-01-07 DIAGNOSIS — M4316 Spondylolisthesis, lumbar region: Secondary | ICD-10-CM | POA: Insufficient documentation

## 2024-01-07 DIAGNOSIS — M51372 Other intervertebral disc degeneration, lumbosacral region with discogenic back pain and lower extremity pain: Secondary | ICD-10-CM | POA: Diagnosis not present

## 2024-01-07 DIAGNOSIS — C7910 Secondary malignant neoplasm of unspecified urinary organs: Secondary | ICD-10-CM

## 2024-01-07 DIAGNOSIS — E039 Hypothyroidism, unspecified: Secondary | ICD-10-CM | POA: Diagnosis not present

## 2024-01-07 DIAGNOSIS — F419 Anxiety disorder, unspecified: Secondary | ICD-10-CM | POA: Insufficient documentation

## 2024-01-07 DIAGNOSIS — M5136 Other intervertebral disc degeneration, lumbar region with discogenic back pain only: Secondary | ICD-10-CM | POA: Insufficient documentation

## 2024-01-07 DIAGNOSIS — Z95828 Presence of other vascular implants and grafts: Secondary | ICD-10-CM

## 2024-01-07 DIAGNOSIS — Z905 Acquired absence of kidney: Secondary | ICD-10-CM | POA: Insufficient documentation

## 2024-01-07 DIAGNOSIS — Z79899 Other long term (current) drug therapy: Secondary | ICD-10-CM | POA: Diagnosis not present

## 2024-01-07 DIAGNOSIS — R918 Other nonspecific abnormal finding of lung field: Secondary | ICD-10-CM | POA: Diagnosis not present

## 2024-01-07 DIAGNOSIS — N1831 Chronic kidney disease, stage 3a: Secondary | ICD-10-CM | POA: Insufficient documentation

## 2024-01-07 DIAGNOSIS — Z79890 Hormone replacement therapy (postmenopausal): Secondary | ICD-10-CM | POA: Diagnosis not present

## 2024-01-07 DIAGNOSIS — Z885 Allergy status to narcotic agent status: Secondary | ICD-10-CM | POA: Insufficient documentation

## 2024-01-07 DIAGNOSIS — M48061 Spinal stenosis, lumbar region without neurogenic claudication: Secondary | ICD-10-CM | POA: Insufficient documentation

## 2024-01-07 DIAGNOSIS — M2578 Osteophyte, vertebrae: Secondary | ICD-10-CM | POA: Insufficient documentation

## 2024-01-07 DIAGNOSIS — I739 Peripheral vascular disease, unspecified: Secondary | ICD-10-CM | POA: Diagnosis not present

## 2024-01-07 DIAGNOSIS — Z7902 Long term (current) use of antithrombotics/antiplatelets: Secondary | ICD-10-CM | POA: Diagnosis not present

## 2024-01-07 DIAGNOSIS — I252 Old myocardial infarction: Secondary | ICD-10-CM | POA: Insufficient documentation

## 2024-01-07 DIAGNOSIS — Z9226 Personal history of immune checkpoint inhibitor therapy: Secondary | ICD-10-CM | POA: Insufficient documentation

## 2024-01-07 LAB — CBC WITH DIFFERENTIAL (CANCER CENTER ONLY)
Abs Immature Granulocytes: 0.03 K/uL (ref 0.00–0.07)
Basophils Absolute: 0 K/uL (ref 0.0–0.1)
Basophils Relative: 1 %
Eosinophils Absolute: 0.2 K/uL (ref 0.0–0.5)
Eosinophils Relative: 4 %
HCT: 34.1 % — ABNORMAL LOW (ref 36.0–46.0)
Hemoglobin: 11.3 g/dL — ABNORMAL LOW (ref 12.0–15.0)
Immature Granulocytes: 1 %
Lymphocytes Relative: 46 %
Lymphs Abs: 2.4 K/uL (ref 0.7–4.0)
MCH: 31.8 pg (ref 26.0–34.0)
MCHC: 33.1 g/dL (ref 30.0–36.0)
MCV: 96.1 fL (ref 80.0–100.0)
Monocytes Absolute: 0.5 K/uL (ref 0.1–1.0)
Monocytes Relative: 11 %
Neutro Abs: 1.9 K/uL (ref 1.7–7.7)
Neutrophils Relative %: 37 %
Platelet Count: 322 K/uL (ref 150–400)
RBC: 3.55 MIL/uL — ABNORMAL LOW (ref 3.87–5.11)
RDW: 14.8 % (ref 11.5–15.5)
WBC Count: 5 K/uL (ref 4.0–10.5)
nRBC: 0 % (ref 0.0–0.2)

## 2024-01-07 LAB — PHOSPHORUS: Phosphorus: 3.6 mg/dL (ref 2.5–4.6)

## 2024-01-07 LAB — CMP (CANCER CENTER ONLY)
ALT: 13 U/L (ref 0–44)
AST: 19 U/L (ref 15–41)
Albumin: 3.6 g/dL (ref 3.5–5.0)
Alkaline Phosphatase: 83 U/L (ref 38–126)
Anion gap: 8 (ref 5–15)
BUN: 21 mg/dL (ref 8–23)
CO2: 27 mmol/L (ref 22–32)
Calcium: 9.3 mg/dL (ref 8.9–10.3)
Chloride: 104 mmol/L (ref 98–111)
Creatinine: 1.3 mg/dL — ABNORMAL HIGH (ref 0.44–1.00)
GFR, Estimated: 43 mL/min — ABNORMAL LOW (ref >60)
Glucose, Bld: 105 mg/dL — ABNORMAL HIGH (ref 70–99)
Potassium: 4.3 mmol/L (ref 3.5–5.1)
Sodium: 139 mmol/L (ref 135–145)
Total Bilirubin: 0.3 mg/dL (ref 0.0–1.2)
Total Protein: 6.3 g/dL — ABNORMAL LOW (ref 6.5–8.1)

## 2024-01-07 LAB — MAGNESIUM: Magnesium: 1.7 mg/dL (ref 1.7–2.4)

## 2024-01-07 MED ORDER — SODIUM CHLORIDE 0.9% FLUSH
10.0000 mL | Freq: Once | INTRAVENOUS | Status: AC
Start: 2024-01-07 — End: 2024-01-07
  Administered 2024-01-07: 10 mL

## 2024-01-07 NOTE — Telephone Encounter (Signed)
 Faxed referral to Dr Joyice at [facility]. Unable to get anybody on the phone at [facility] to get pt an appt.

## 2024-01-07 NOTE — Telephone Encounter (Signed)
 Progress note, scan results  faxed to [facility] for Dr Joyice staff.

## 2024-01-07 NOTE — Progress Notes (Signed)
 [facility] [phone]   [phone]  OFFICE PROGRESS NOTE  Morgan Lung, MD [address] [place] [address]  DIAGNOSIS: Metastatic urothelial carcinoma that was initially diagnosed as stage II (T2, N0, M0) in September 2022 status post right nephroureterectomy as well as intravesical treatment and resection of superficial tumor in the bladder several times and March 2023 as well as July 2023.  The patient was found to have enlarging and new pulmonary nodules consistent with metastatic disease in November 2023.    PRIOR THERAPY: 1) Right nephroureterectomy as well as intravesical treatment and resection of superficial tumor in the bladder several times and March 2023 as well as July 2023.  2) Palliative systemic chemotherapy with carboplatin  for AUC of 5 on day 1 and gemcitabine  1000 mg/M2 on days 1 and 8 every 3 weeks. First cycle June 21, 2022. Status post 6 cycles. Her dose of carboplatin  was reduced to AUC of 4 and gemcitabine  to 800 mg/m2 starting from cycle #5 due to cytopenias. 3) Maintenance immunotherapy with Avelumab  800 mg IV every 2 weeks. First dose on 11/15/22. Status post 6 cycle of treatment. 4) Erdafitinib  (balversa ) 8 mg p.o daily. First dose 02/22/23. Status post 4 months of treatment. Discontinued in February 2025 due to disease progression.  5) Enfortumab vedotin  infusion therapy: once a week for three weeks, followed by one week off, repeated every four weeks. First dose was on 08/08/2023.  Status post 1 cycle.  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 6) sacituzumab govitecan  (Trodelvy ) 10 mg/kg on days 1 and 8 every 3 weeks. First dose 11/05/2023.  Status post 3 cycles discontinued secondary to disease progression.   CURRENT THERAPY: None.   INTERVAL HISTORY: Morgan Torres 73 y.o. female returns to  the clinic today for follow-up visit accompanied by her sister. Discussed the use of AI scribe software for clinical note transcription with the patient, who gave verbal consent to proceed.  History of Present Illness Morgan Torres is a 73 year old female with metastatic urothelial carcinoma who presents for evaluation and discussion of her recent scan results. She is accompanied by her sister, Alisa. She was referred by Dr. Joyice for further evaluation and potential treatment options.  She has a history of metastatic urothelial carcinoma, initially diagnosed as stage II in September 2022, which progressed to metastatic status by November 2023. She has undergone multiple systemic chemotherapy regimens, including carboplatin  and gemcitabine , followed by maintenance avelumab , idrafate orally, and enfortumab vedotin , which was discontinued due to toxicity. Most recently, she was treated with sacituzumab govetican, which was also discontinued due to disease progression.  She feels 'fine' today with no new symptoms since her last visit. No chest pain, shortness of breath, cough, nausea, vomiting, or headaches. She is uncertain about any recent weight loss, stating she is 'about the same.'     MEDICAL HISTORY: Past Medical History:  Diagnosis Date   Anemia    Anxiety    Arthritis    hip, lumbar spine    Asthma    seasonal allergies    Bronchitis    Hx: of   Chronic kidney disease    COPD (chronic obstructive pulmonary disease) (HCC)    Coronary artery disease    Depression    Family history of adverse reaction to anesthesia    Sister hard to wake  up   Fibromyalgia    Hypertension    Hypothyroidism    Lumbar herniated disc    Metastatic urothelial carcinoma (HCC)    Myocardial infarction (HCC) 06/25/2012   followed by Dr. Mona, treated medically, no stents   Peripheral arterial disease (HCC)    of right foot   Pneumonia    Hx: of yrs ago   Sciatica    Sleep apnea    Small bowel  obstruction (HCC)    several yrs ago    ALLERGIES:  is allergic to diclofenac, padcev  [enfortumab vedotin ], ciprofloxacin, codeine, and hydrocodone.  MEDICATIONS:  Current Outpatient Medications  Medication Sig Dispense Refill   acetaminophen  (TYLENOL ) 500 MG tablet Take 1 tablet (500 mg total) by mouth every 6 (six) hours as needed for mild pain. 30 tablet 0   albuterol  (PROVENTIL  HFA;VENTOLIN  HFA) 108 (90 Base) MCG/ACT inhaler Inhale 2 puffs into the lungs every 6 (six) hours as needed for wheezing or shortness of breath. 1 Inhaler 0   amLODipine  (NORVASC ) 10 MG tablet Take 10 mg by mouth daily. (Patient not taking: Reported on 12/24/2023)     aspirin  EC 81 MG tablet Take 81 mg by mouth daily. Swallow whole. (Patient not taking: Reported on 12/24/2023)     benzonatate  (TESSALON ) 100 MG capsule Take 100 mg by mouth 3 (three) times daily as needed for cough.     calcium  acetate (PHOSLO ) 667 MG tablet Take 667 mg by mouth 3 (three) times daily with meals.     cilostazol  (PLETAL ) 50 MG tablet Take 50 mg by mouth 2 (two) times daily.     clotrimazole  (LOTRIMIN ) 1 % external solution Apply topically 2 (two) times daily. (Patient taking differently: Apply 1 Application topically 2 (two) times daily.) 30 mL 0   cyclobenzaprine  (FLEXERIL ) 10 MG tablet Take 1 tablet (10 mg total) by mouth 2 (two) times daily as needed for muscle spasms. 20 tablet 0   dexamethasone  (DECADRON ) 4 MG tablet Take 2 tablets (8 mg) daily for 3 days. Start the day after chemotherapy. 30 tablet 1   EPINEPHrine  (EPIPEN  2-PAK) 0.3 mg/0.3 mL DEVI Inject 0.3 mLs (0.3 mg total) into the muscle once as needed (for severe allergic reaction). CAll 911 immediately if you have to use this medicine (Patient taking differently: Inject 0.3 mg into the muscle once as needed (for a severe allergic reaction- CALL 9-1-1 IMMEDIATELY IF USED).) 1 Device 1   gabapentin  (NEURONTIN ) 300 MG capsule Take 300 mg by mouth at bedtime.     guaiFENesin   (MUCINEX ) 600 MG 12 hr tablet Take 2 tablets (1,200 mg total) by mouth 2 (two) times daily. (Patient taking differently: Take 1,200 mg by mouth 2 (two) times daily as needed for to loosen phlegm.) 120 tablet 0   ipratropium (ATROVENT ) 0.06 % nasal spray Place 2 sprays into the nose daily as needed for rhinitis.     levothyroxine  (SYNTHROID ) 100 MCG tablet Take 1 tablet (100 mcg total) by mouth daily before breakfast. 30 tablet 1   lidocaine -prilocaine  (EMLA ) cream Apply 1 Application topically as needed. (Patient taking differently: Apply 1 Application topically as needed (port).) 30 g 2   loperamide  (IMODIUM ) 2 MG capsule Take 2 tabs by mouth with first loose stool, then 1 tab with each additional loose stool as needed. Do not exceed 8 tabs in a 24-hour period (Patient taking differently: Take 2-4 mg by mouth as needed for diarrhea or loose stools. Take 2 tabs by mouth with first loose stool,  then 1 tab with each additional loose stool as needed. Do not exceed 8 tabs in a 24-hour period) 60 capsule 3   LORazepam  (ATIVAN ) 0.5 MG tablet Take 0.5 mg by mouth daily as needed for anxiety.     magnesium  oxide (MAG-OX) 400 (240 Mg) MG tablet TAKE 1 TABLET BY MOUTH EVERY DAY 90 tablet 1   methocarbamol  (ROBAXIN ) 500 MG tablet Take 1 tablet (500 mg total) by mouth 2 (two) times daily. 20 tablet 0   metoprolol  tartrate (LOPRESSOR ) 25 MG tablet Take 1 tablet by mouth 2 (two) times daily. (Patient taking differently: Take 1 tablet by mouth as needed.)     MILK OF MAGNESIA 400 MG/5ML suspension Take 15-30 mLs by mouth daily as needed for mild constipation.     montelukast  (SINGULAIR ) 10 MG tablet Take 10 mg by mouth daily.     nicotine  (NICODERM CQ  - DOSED IN MG/24 HOURS) 21 mg/24hr patch Place 21 mg onto the skin daily.     nicotine  polacrilex (NICORETTE ) 4 MG gum Take 4 mg by mouth as needed for smoking cessation.     nitroGLYCERIN  (NITROSTAT ) 0.4 MG SL tablet Place 1 tablet (0.4 mg total) under the tongue every  5 (five) minutes x 3 doses as needed for chest pain. 25 tablet 2   ondansetron  (ZOFRAN ) 8 MG tablet Take 1 tablet (8 mg total) by mouth every 8 (eight) hours as needed for nausea or vomiting. Start on the third day after chemotherapy. 30 tablet 1   oxybutynin  (DITROPAN ) 5 MG tablet Take 1 tablet (5 mg total) by mouth every 8 (eight) hours as needed for bladder spasms. 30 tablet 1   oxyCODONE  (ROXICODONE ) 5 MG immediate release tablet Take 1 tablet (5 mg total) by mouth every 4 (four) hours as needed for severe pain (pain score 7-10). 15 tablet 0   oxyCODONE -acetaminophen  (PERCOCET/ROXICET) 5-325 MG tablet Take 1 tablet by mouth every 6 (six) hours as needed for severe pain (pain score 7-10). 15 tablet 0   phenazopyridine  (PYRIDIUM ) 200 MG tablet Take 1 tablet (200 mg total) by mouth 3 (three) times daily as needed (for pain with urination). 30 tablet 0   Potassium & Sodium Phosphates (PHOSPHORUS  W/SOD & POTASSIUM) 280-160-250 MG PACK TAKE 1 PACKAGE BY MOUTH 2 (TWO) TIMES DAILY. 30 each 1   prochlorperazine  (COMPAZINE ) 10 MG tablet Take 1 tablet (10 mg total) by mouth every 6 (six) hours as needed. 30 tablet 2   sodium chloride  1 g tablet TAKE 1 TABLET (1 G TOTAL) BY MOUTH DAILY. SPLIT 1 GRAM INTO TWO HALVES AND TAKE BY MOUTH ONCE A DAY (Patient taking differently: Take 0.5 g by mouth in the morning and at bedtime.) 30 tablet 0   temazepam  (RESTORIL ) 15 MG capsule Take 1 capsule (15 mg total) by mouth at bedtime as needed for sleep. 30 capsule 0   traMADol  (ULTRAM ) 50 MG tablet Take 50 mg by mouth every 6 (six) hours as needed (for pain).     No current facility-administered medications for this visit.    SURGICAL HISTORY:  Past Surgical History:  Procedure Laterality Date   ABDOMINAL SURGERY     resection of small intestine   ANTERIOR CRUCIATE LIGAMENT REPAIR Bilateral    BACK SURGERY     fusion of lumbar   CARDIAC CATHETERIZATION  06/25/2012   COLON SURGERY     resection for bowel - for  obstruction  6 to 7 yrs ago per pt on 01-05-2022   COLONOSCOPY  Hx; of   DILATION AND CURETTAGE OF UTERUS     EYE SURGERY Bilateral    cataracts removed   HERNIA REPAIR  02/23/1969   inguinal hernia    IR IMAGING GUIDED PORT INSERTION  06/07/2022   LEFT HEART CATHETERIZATION WITH CORONARY ANGIOGRAM N/A 03/30/2013   Procedure: LEFT HEART CATHETERIZATION WITH CORONARY ANGIOGRAM;  Surgeon: Alm LELON Clay, MD;  Location: [facility] CATH LAB;  Service: Cardiovascular;  Laterality: N/A;   left thumb surgery     yrs ago   ROBOT ASSITED LAPAROSCOPIC NEPHROURETERECTOMY Left 03/09/2021   Procedure: XI ROBOT ASSITED LAPAROSCOPIC NEPHROURETERECTOMY/ CYSTOSCOPY WITH LEFT URETEROSCOPY WITH TRANSURETHRAL RESECTION OF URETERAL ORIFICE;  Surgeon: Devere Lonni Righter, MD;  Location: [facility] ORS;  Service: Urology;  Laterality: Left;   SALIVARY GLAND SURGERY Left    approached from inside mouth & side of neck 1970-1980   TONSILLECTOMY     as child   TOTAL HIP ARTHROPLASTY Left 05/28/2014   Procedure: LEFT TOTAL HIP ARTHROPLASTY;  Surgeon: LELON JONETTA Shari Mickey., MD;  Location: [facility] OR;  Service: Orthopedics;  Laterality: Left;   TRANSURETHRAL RESECTION OF BLADDER TUMOR N/A 08/23/2021   Procedure: TRANSURETHRAL RESECTION OF BLADDER TUMOR (TURBT) WITH CYSTOSCOPY/ POSTOPERATIVE INSTILLATION OF GEMCITABINE /retrograde pyelogram and stent placement (right);  Surgeon: Devere Lonni Righter, MD;  Location: [facility];  Service: Urology;  Laterality: N/A;   TRANSURETHRAL RESECTION OF BLADDER TUMOR N/A 01/10/2022   Procedure: TRANSURETHRAL RESECTION OF BLADDER TUMOR (TURBT) WITH CYSTOSCOPY / GEMCITABINE  INSTILLATION post-operatively;  Surgeon: Devere Lonni Righter, MD;  Location: [facility];  Service: Urology;  Laterality: N/A;  ONLY NEEDS 30 MIN   TRANSURETHRAL RESECTION OF BLADDER TUMOR N/A 12/25/2023   Procedure: TURBT (TRANSURETHRAL RESECTION OF BLADDER TUMOR);  Surgeon: Devere Lonni Righter, MD;  Location: [facility];  Service: Urology;  Laterality: N/A;  CYSTOSCOPY WITH TURBT (TRANSURETHRAL RESECTION OF BLADDER TUMOR)    REVIEW OF SYSTEMS:  Constitutional: positive for fatigue Eyes: negative Ears, nose, mouth, throat, and face: negative Respiratory: negative Cardiovascular: negative Gastrointestinal: negative Genitourinary:negative Integument/breast: negative Hematologic/lymphatic: negative Musculoskeletal:negative Neurological: negative Behavioral/Psych: negative Endocrine: negative Allergic/Immunologic: negative   PHYSICAL EXAMINATION: General appearance: alert, cooperative, fatigued, and no distress Head: Normocephalic, without obvious abnormality, atraumatic Neck: no adenopathy, no JVD, supple, symmetrical, trachea midline, and thyroid  not enlarged, symmetric, no tenderness/mass/nodules Lymph nodes: Cervical, supraclavicular, and axillary nodes normal. Resp: clear to auscultation bilaterally Back: symmetric, no curvature. ROM normal. No CVA tenderness. Cardio: regular rate and rhythm, S1, S2 normal, no murmur, click, rub or gallop GI: soft, non-tender; bowel sounds normal; no masses,  no organomegaly Extremities: extremities normal, atraumatic, no cyanosis or edema Neurologic: Alert and oriented X 3, normal strength and tone. Normal symmetric reflexes. Normal coordination and gait  ECOG PERFORMANCE STATUS: 1 - Symptomatic but completely ambulatory  Blood pressure 108/64, pulse 87, temperature 97.7 F (36.5 C), temperature source Temporal, resp. rate 17, height 5' 8 (1.727 m), weight 163 lb 4.8 oz (74.1 kg), SpO2 100%.  LABORATORY DATA: Lab Results  Component Value Date   WBC 6.1 12/22/2023   HGB 12.1 12/22/2023   HCT 38.4 12/22/2023   MCV 100.3 (H) 12/22/2023   PLT 437 (H) 12/22/2023      Chemistry      Component Value Date/Time   NA 133 (L) 12/22/2023 0957   K 4.4 12/22/2023 0957   CL 100 12/22/2023 0957   CO2 25 12/22/2023 0957   BUN 25 (H)  12/22/2023 0957   CREATININE 1.30 (  H) 12/22/2023 0957   CREATININE 1.27 (H) 12/17/2023 1009      Component Value Date/Time   CALCIUM  8.8 (L) 12/22/2023 0957   ALKPHOS 78 12/17/2023 1009   AST 12 (L) 12/17/2023 1009   ALT 11 12/17/2023 1009   BILITOT 0.2 12/17/2023 1009       RADIOGRAPHIC STUDIES: CT Chest Wo Contrast Result Date: 01/02/2024 CLINICAL DATA:  Metastatic bladder cancer. EXAM: CT CHEST AND ABDOMEN WITHOUT CONTRAST TECHNIQUE: Multidetector CT imaging of the chest and abdomen was performed following the standard protocol without intravenous contrast. RADIATION DOSE REDUCTION: This exam was performed according to the departmental dose-optimization program which includes automated exposure control, adjustment of the mA and/or kV according to patient size and/or use of iterative reconstruction technique. COMPARISON:  CT dated 07/09/2023. FINDINGS: Evaluation of this exam is limited in the absence of intravenous contrast. CT CHEST FINDINGS WITHOUT CONTRAST Cardiovascular: There is no cardiomegaly or pericardial effusion. Three-vessel coronary vascular calcification. Advanced atherosclerotic calcification of the thoracic aorta. No as well dilatation. The central pulmonary arteries are grossly unremarkable. Right-sided Port-A-Cath with tip at the cavoatrial junction. Mediastinum/Nodes: No obvious hilar adenopathy. Interval increase in the size of the mediastinal adenopathy since the prior CT. A lymph node anterior to the carina measures 2.2 cm in short axis (previously 1.0 cm). Subcarinal lymph node measures 2 cm in short axis (previously 1.6 cm). Right posterior paratracheal lymph node measures 1.5 cm (10/2) and new since the prior CT. The esophagus is grossly unremarkable. No mediastinal fluid collection. Lungs/Pleura: Interval increase in the size of the nodule in the superior segment of the left lower lobe measuring 1.6 x 2.4 cm (previously 1.0 x 1.5 cm). A 1 cm subpleural nodule in the  posterior right lower lobe as well as several nodules in the left upper lobe measuring up to 13 mm are new since the prior CT. There is background of emphysema. No consolidative changes. There is no pleural effusion pneumothorax. The central airways are patent. Musculoskeletal: Osteopenia.  No acute osseous pathology. CT ABDOMEN FINDINGS WITHOUT CONTRAST No intra-abdominal free air or free fluid. Hepatobiliary: Stable small hypodense lesion in the left lobe of the liver, likely a cyst. No acute radiation. The gallbladder is unremarkable. Pancreas: Unremarkable. No pancreatic ductal dilatation or surrounding inflammatory changes. Spleen: Normal in size without focal abnormality. Adrenals/Urinary Tract: The adrenal glands unremarkable. Status post prior left nephrectomy. No hydronephrosis or nephrolithiasis on the right. Stomach/Bowel: Moderate stool throughout the colon. No bowel dilatation or inflammation. Vascular/Lymphatic: Advanced atherosclerotic calcification of the abdominal aorta. The IVC is unremarkable. No portal venous gas. There is no adenopathy. Other: None Musculoskeletal: Osteopenia with degenerative changes of the spine. Similar appearance of sclerotic changes predominantly involving L2 and L3, likely degenerative. No acute osseous pathology. IMPRESSION: 1. Progression of metastatic disease in the chest with increased in the mediastinal adenopathy and new and enlarging pulmonary metastatic nodules. 2. No evidence of metastatic disease in the abdomen 3. Aortic Atherosclerosis (ICD10-I70.0) and Emphysema (ICD10-J43.9). Electronically Signed   By: Vanetta Chou M.D.   On: 01/02/2024 17:08   CT ABDOMEN WO CONTRAST Result Date: 01/02/2024 CLINICAL DATA:  Metastatic bladder cancer. EXAM: CT CHEST AND ABDOMEN WITHOUT CONTRAST TECHNIQUE: Multidetector CT imaging of the chest and abdomen was performed following the standard protocol without intravenous contrast. RADIATION DOSE REDUCTION: This exam was  performed according to the departmental dose-optimization program which includes automated exposure control, adjustment of the mA and/or kV according to patient size and/or use of iterative reconstruction  technique. COMPARISON:  CT dated 07/09/2023. FINDINGS: Evaluation of this exam is limited in the absence of intravenous contrast. CT CHEST FINDINGS WITHOUT CONTRAST Cardiovascular: There is no cardiomegaly or pericardial effusion. Three-vessel coronary vascular calcification. Advanced atherosclerotic calcification of the thoracic aorta. No as well dilatation. The central pulmonary arteries are grossly unremarkable. Right-sided Port-A-Cath with tip at the cavoatrial junction. Mediastinum/Nodes: No obvious hilar adenopathy. Interval increase in the size of the mediastinal adenopathy since the prior CT. A lymph node anterior to the carina measures 2.2 cm in short axis (previously 1.0 cm). Subcarinal lymph node measures 2 cm in short axis (previously 1.6 cm). Right posterior paratracheal lymph node measures 1.5 cm (10/2) and new since the prior CT. The esophagus is grossly unremarkable. No mediastinal fluid collection. Lungs/Pleura: Interval increase in the size of the nodule in the superior segment of the left lower lobe measuring 1.6 x 2.4 cm (previously 1.0 x 1.5 cm). A 1 cm subpleural nodule in the posterior right lower lobe as well as several nodules in the left upper lobe measuring up to 13 mm are new since the prior CT. There is background of emphysema. No consolidative changes. There is no pleural effusion pneumothorax. The central airways are patent. Musculoskeletal: Osteopenia.  No acute osseous pathology. CT ABDOMEN FINDINGS WITHOUT CONTRAST No intra-abdominal free air or free fluid. Hepatobiliary: Stable small hypodense lesion in the left lobe of the liver, likely a cyst. No acute radiation. The gallbladder is unremarkable. Pancreas: Unremarkable. No pancreatic ductal dilatation or surrounding inflammatory  changes. Spleen: Normal in size without focal abnormality. Adrenals/Urinary Tract: The adrenal glands unremarkable. Status post prior left nephrectomy. No hydronephrosis or nephrolithiasis on the right. Stomach/Bowel: Moderate stool throughout the colon. No bowel dilatation or inflammation. Vascular/Lymphatic: Advanced atherosclerotic calcification of the abdominal aorta. The IVC is unremarkable. No portal venous gas. There is no adenopathy. Other: None Musculoskeletal: Osteopenia with degenerative changes of the spine. Similar appearance of sclerotic changes predominantly involving L2 and L3, likely degenerative. No acute osseous pathology. IMPRESSION: 1. Progression of metastatic disease in the chest with increased in the mediastinal adenopathy and new and enlarging pulmonary metastatic nodules. 2. No evidence of metastatic disease in the abdomen 3. Aortic Atherosclerosis (ICD10-I70.0) and Emphysema (ICD10-J43.9). Electronically Signed   By: Vanetta Chou M.D.   On: 01/02/2024 17:08   MR LUMBAR SPINE WO CONTRAST Result Date: 12/22/2023 CLINICAL DATA:  73 year old female with low back pain. Prior surgery. History of metastatic urothelial carcinoma. Oral EXAM: MRI LUMBAR SPINE WITHOUT CONTRAST TECHNIQUE: Multiplanar, multisequence MR imaging of the lumbar spine was performed. No intravenous contrast was administered. COMPARISON:  Lumbar spine CT 12/09/2023.  Lumbar MRI 10/03/2022. FINDINGS: Segmentation: Normal on the comparison CT, same numbering system used on the previous MRI. Alignment: Lumbar lordosis with underlying mild levoconvex scoliosis not significantly changed from last year. Vertebrae: Mild chronic hardware susceptibility artifact at L5-S1. Solid arthrodesis there on the recent CT. Chronic severe degenerative appearing sclerosis of the L2 and L3 vertebral bodies, endplates. Appearance is very similar to the MRI last year. Patchy widespread vertebral body marrow edema at that time has resolved but  rightward asymmetric endplate marrow edema persists (series 7, image 7). Severe disc space loss there and vacuum disc on the recent CT. Background bone marrow signal is normal. Maintained vertebral height elsewhere. Intact visible sacrum and SI joints. Conus medullaris and cauda equina: Conus extends to the L1-L2 level. No lower spinal cord or conus signal abnormality. Paraspinal and other soft tissues: Stable. Disc levels:  Mostly visible T11-T12 level through L1-L2: Mild for age degeneration is stable without spinal stenosis. There is moderate facet hypertrophy at L1-L2. L2-L3: Chronic severe disc and endplate degeneration. Circumferential disc osteophyte complex asymmetric to the right with moderate facet and ligament flavum hypertrophy. Moderate spinal and severe right lateral recess stenosis (series 9, image 18 descending right L3 nerve level) do appear progressed from last year. Moderate to severe left and moderate right L2 foraminal stenosis appear more stable. L3-L4: Chronic circumferential disc bulge and endplate spurring asymmetric to the left with moderate to severe facet and ligament flavum hypertrophy. Mild to moderate spinal stenosis appears improved from last year (series 9, image 24). Moderate right lateral recess stenosis (right L4 nerve level). Moderate bilateral L3 foraminal stenosis appears stable and greater on the left. L4-L5: Chronic circumferential disc bulge with endplate spurring. Moderate to severe facet hypertrophy. Degenerative facet joint fluid on the right is new from last year. Moderate ligament flavum hypertrophy. Mild spinal stenosis. Moderate bilateral lateral recess stenosis (L5 nerve levels). Moderate left and moderate to severe right L4 foraminal stenosis. This level is stable. L5-S1:  Stable decompression and fusion. IMPRESSION: 1. Chronic L5-S1 decompression and fusion with stable adjacent segment disease at L4-L5 resulting in moderate lateral recess and moderate to severe  right greater than left neural foraminal stenosis. 2. Chronic very severe disc and endplate degeneration at L2-L3 with progressed multifactorial Moderate spinal, severe right lateral recess stenosis since the MRI last year. Stable moderate to severe L2 foraminal stenosis. 3. Stable to mildly improved multifactorial spinal stenosis at L3-L4, now mild-to-moderate. Electronically Signed   By: VEAR Hurst M.D.   On: 12/22/2023 11:26   CT Lumbar Spine Wo Contrast Result Date: 12/09/2023 CLINICAL DATA:  Pain, known malignancy. Right hip pain moving down leg for 10 days. EXAM: CT LUMBAR SPINE WITHOUT CONTRAST TECHNIQUE: Multidetector CT imaging of the lumbar spine was performed without intravenous contrast administration. Multiplanar CT image reconstructions were also generated. RADIATION DOSE REDUCTION: This exam was performed according to the departmental dose-optimization program which includes automated exposure control, adjustment of the mA and/or kV according to patient size and/or use of iterative reconstruction technique. COMPARISON:  MRI lumbar spine 10/03/2022. FINDINGS: Segmentation: 5 lumbar type vertebrae. Alignment: Straightening of the normal lumbar lordosis. Trace retrolisthesis of L2 on L3. Vertebrae: Posterior instrumented fusion at L5-S1 with bilateral pedicle screws and vertical interlocking rods. The left-sided L5 pedicle screw traverses the superior endplate and partially enters the L4-5 disc space. Interbody spacer at L5-S1 with mature osseous fusion at this level. Hardware is intact. Significant degenerative endplate changes and sclerosis at L2-3 which is not significantly changed since 2004. Additional sclerosis along the anterior inferior aspect of L1 and L4 likely related to additional degenerative changes. No destructive osseous lesion. Sequelae of decompressive laminectomy at L5-S1. Degenerative changes of the sacroiliac joints with partial ankylosis of the right SI joint. Paraspinal and other  soft tissues: The paraspinal soft tissues are unremarkable. Extensive atherosclerosis of the abdominal aorta and branch vessels. Sequelae of left nephrectomy. Disc levels: T12-L1: No significant disc bulge. No significant spinal canal stenosis or foraminal stenosis. L1-2: Small disc bulge and left foraminal disc protrusion. Mild facet arthrosis. No significant spinal canal stenosis. Mild bilateral foraminal stenosis. L2-3: Moderate to severe disc space narrowing with vacuum disc phenomenon. Diffuse disc bulge and posterior osteophytes resulting in lateral recess narrowing. Bilateral facet arthrosis and thickening of the ligamentum flavum. Mild-to-moderate spinal canal stenosis. There is moderate right and mild left foraminal  stenosis. L3-4: Diffuse disc bulge and posterior osteophytes likely with lateral recess narrowing. Bilateral facet arthrosis and thickening of the ligamentum flavum. Moderate spinal canal stenosis. There is moderate bilateral foraminal stenosis. L4-5: Diffuse disc bulge with likely lateral recess narrowing. Moderate facet arthrosis and thickening of the ligamentum flavum. Moderate to severe spinal canal stenosis. There is moderate bilateral foraminal stenosis slightly greater on the right. L5-S1: Postsurgical changes. No high-grade osseous spinal canal stenosis. Bilateral facet arthrosis along with disc bulge and posterior osteophytes contributing to severe right and mild-to-moderate left foraminal stenosis. IMPRESSION: No acute abnormality of the lumbar spine. Prominent sclerosis at multiple lumbar vertebral bodies particularly at L2-3 likely degenerative in etiology and not significantly changed since 2024. No destructive osseous lesion. Decompressive laminectomy and posterior interbody fusion at L5-S1. Left L5 pedicle screw traverses the superior endplate and partially involves the L4-5 disc space. Degenerative changes as above. Moderate to severe spinal canal stenosis at L4-5 and moderate  spinal canal stenosis at L3-4. Lateral recess narrowing at multiple levels. Multilevel foraminal stenosis most pronounced on the right at L5-S1. Electronically Signed   By: Donnice Mania M.D.   On: 12/09/2023 13:16   CT PELVIS WO CONTRAST Result Date: 12/09/2023 CLINICAL DATA:  Pelvic pain, history of bladder neoplasm. EXAM: CT PELVIS WITHOUT CONTRAST TECHNIQUE: Multidetector CT imaging of the pelvis was performed following the standard protocol without intravenous contrast. RADIATION DOSE REDUCTION: This exam was performed according to the departmental dose-optimization program which includes automated exposure control, adjustment of the mA and/or kV according to patient size and/or use of iterative reconstruction technique. COMPARISON:  CT pelvis and abdomen January 31, 2023 FINDINGS: Urinary Tract: Collapsed bladder with a air-fluid level related to recent instrumentation. Bowel:  Insert 1 Vascular/Lymphatic: No pathologically enlarged lymph nodes. No significant vascular abnormality seen. Reproductive:  No mass or other significant abnormality Other:  None. Musculoskeletal: No suspicious bone lesions identified. Prior L5-S1 transpedicular fixation with expansile laminectomy with significant degenerative hypertrophic osteoarthritic facet changes Total left hip arthroplasty IMPRESSION: *No acute findings. *Collapsed bladder with a air-fluid level related to recent instrumentation. *Prior L5-S1 transpedicular fixation with expansile laminectomy with significant degenerative hypertrophic osteoarthritic facet changes. Electronically Signed   By: Franky Chard M.D.   On: 12/09/2023 13:14    ASSESSMENT AND PLAN: This is a very pleasant 74 years old African-American female with metastatic urothelial carcinoma that was initially diagnosed as stage II in September 2022 but has evidence for disease progression with pulmonary metastasis in November 2023.  She underwent several treatment in the past including systemic  chemotherapy with carboplatin  and gemcitabine  followed by maintenance treatment with Avelumab  discontinued secondary to disease progression.  Then the patient underwent treatment with erdafitinib  for 4 months discontinued secondary to disease progression.  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  She was seen for a second opinion by Dr. Joyice at [facility] and she recommended retesting for HER2 expression which unfortunately came back negative.  It was recommended for the patient to consider treatment with Sacituzumab Govitecan .  She started treatment with sacituzumab govitecan  at dose of 10 Mg/KG on days 1 and 8 every 3 weeks.  First dose was 11/06/2023.  She is status post 3 cycles.  Last dose was given 12/17/2023 discontinued secondary to disease progression. The patient had repeat CT scan of the chest, abdomen and pelvis performed recently.  I  personally and independently reviewed the scan.  Unfortunately her scan showed evidence for disease progression. I had a lengthy discussion with the patient and her sister about her current condition and treatment options. Assessment and Plan Assessment & Plan Metastatic urothelial carcinoma Disease progression despite multiple lines of systemic chemotherapy, including carboplatin , gemcitabine , avelumab , idrafate, enfortumab vedotin , and sacituzumab govetican. Recent CT scan shows continued cancer growth. Limited treatment options remain, necessitating consideration of clinical trials. Paclitaxel is a potential option, though its efficacy at this stage is uncertain. Discussion of hospice care for comfort measures was initiated, but she is not ready to pursue this route. - Refer to Dr. Joyice at [facility] for evaluation and potential clinical trial enrollment - Consider paclitaxel if no clinical trial  options are available - Discuss hospice care as an option for comfort measures if no further treatment options are viable The patient was advised to call immediately if she has any concerning symptoms in the interval.  The patient voices understanding of current disease status and treatment options and is in agreement with the current care plan.  All questions were answered. The patient knows to call the clinic with any problems, questions or concerns. We can certainly see the patient much sooner if necessary.  The total time spent in the appointment was 30 minutes including review of chart and various tests results, discussions about plan of care and coordination of care plan .   Disclaimer: This note was dictated with voice recognition software. Similar sounding words can inadvertently be transcribed and may not be corrected upon review.

## 2024-01-08 ENCOUNTER — Telehealth: Payer: Self-pay | Admitting: Internal Medicine

## 2024-01-08 NOTE — Telephone Encounter (Signed)
Scheduled appointments with the patient

## 2024-01-09 ENCOUNTER — Other Ambulatory Visit: Payer: Self-pay | Admitting: Internal Medicine

## 2024-01-09 DIAGNOSIS — E039 Hypothyroidism, unspecified: Secondary | ICD-10-CM

## 2024-01-14 ENCOUNTER — Inpatient Hospital Stay

## 2024-01-15 DIAGNOSIS — C689 Malignant neoplasm of urinary organ, unspecified: Secondary | ICD-10-CM | POA: Diagnosis not present

## 2024-01-15 DIAGNOSIS — Z79899 Other long term (current) drug therapy: Secondary | ICD-10-CM | POA: Diagnosis not present

## 2024-01-15 DIAGNOSIS — M5440 Lumbago with sciatica, unspecified side: Secondary | ICD-10-CM | POA: Diagnosis not present

## 2024-01-15 DIAGNOSIS — C772 Secondary and unspecified malignant neoplasm of intra-abdominal lymph nodes: Secondary | ICD-10-CM | POA: Diagnosis not present

## 2024-01-22 DIAGNOSIS — C689 Malignant neoplasm of urinary organ, unspecified: Secondary | ICD-10-CM | POA: Diagnosis not present

## 2024-01-23 DIAGNOSIS — R5381 Other malaise: Secondary | ICD-10-CM | POA: Diagnosis not present

## 2024-01-24 ENCOUNTER — Ambulatory Visit: Admitting: Internal Medicine

## 2024-01-28 ENCOUNTER — Encounter (HOSPITAL_COMMUNITY): Payer: Self-pay | Admitting: Anatomic Pathology

## 2024-01-31 ENCOUNTER — Other Ambulatory Visit: Payer: Self-pay | Admitting: Internal Medicine

## 2024-01-31 DIAGNOSIS — E039 Hypothyroidism, unspecified: Secondary | ICD-10-CM

## 2024-02-01 DIAGNOSIS — C689 Malignant neoplasm of urinary organ, unspecified: Secondary | ICD-10-CM | POA: Diagnosis not present

## 2024-02-03 ENCOUNTER — Encounter (HOSPITAL_COMMUNITY): Payer: Self-pay

## 2024-02-03 ENCOUNTER — Encounter: Payer: Self-pay | Admitting: Internal Medicine

## 2024-02-04 DIAGNOSIS — J449 Chronic obstructive pulmonary disease, unspecified: Secondary | ICD-10-CM | POA: Diagnosis not present

## 2024-02-04 DIAGNOSIS — M545 Low back pain, unspecified: Secondary | ICD-10-CM | POA: Diagnosis not present

## 2024-02-04 DIAGNOSIS — D631 Anemia in chronic kidney disease: Secondary | ICD-10-CM | POA: Diagnosis not present

## 2024-02-04 DIAGNOSIS — R634 Abnormal weight loss: Secondary | ICD-10-CM | POA: Diagnosis not present

## 2024-02-04 DIAGNOSIS — C7911 Secondary malignant neoplasm of bladder: Secondary | ICD-10-CM | POA: Diagnosis not present

## 2024-02-04 DIAGNOSIS — C679 Malignant neoplasm of bladder, unspecified: Secondary | ICD-10-CM | POA: Diagnosis not present

## 2024-02-04 DIAGNOSIS — N1831 Chronic kidney disease, stage 3a: Secondary | ICD-10-CM | POA: Diagnosis not present

## 2024-02-04 DIAGNOSIS — E039 Hypothyroidism, unspecified: Secondary | ICD-10-CM | POA: Diagnosis not present

## 2024-02-11 ENCOUNTER — Inpatient Hospital Stay

## 2024-02-11 ENCOUNTER — Encounter: Payer: Self-pay | Admitting: Radiology

## 2024-02-11 ENCOUNTER — Inpatient Hospital Stay: Attending: Internal Medicine | Admitting: Internal Medicine

## 2024-02-11 VITALS — BP 145/77 | HR 69 | Temp 98.2°F | Resp 17 | Ht 68.0 in | Wt 165.0 lb

## 2024-02-11 DIAGNOSIS — D649 Anemia, unspecified: Secondary | ICD-10-CM | POA: Insufficient documentation

## 2024-02-11 DIAGNOSIS — Z95828 Presence of other vascular implants and grafts: Secondary | ICD-10-CM

## 2024-02-11 DIAGNOSIS — Z79899 Other long term (current) drug therapy: Secondary | ICD-10-CM | POA: Insufficient documentation

## 2024-02-11 DIAGNOSIS — Z881 Allergy status to other antibiotic agents status: Secondary | ICD-10-CM | POA: Insufficient documentation

## 2024-02-11 DIAGNOSIS — L511 Stevens-Johnson syndrome: Secondary | ICD-10-CM | POA: Insufficient documentation

## 2024-02-11 DIAGNOSIS — I129 Hypertensive chronic kidney disease with stage 1 through stage 4 chronic kidney disease, or unspecified chronic kidney disease: Secondary | ICD-10-CM | POA: Diagnosis not present

## 2024-02-11 DIAGNOSIS — Z9226 Personal history of immune checkpoint inhibitor therapy: Secondary | ICD-10-CM | POA: Insufficient documentation

## 2024-02-11 DIAGNOSIS — R21 Rash and other nonspecific skin eruption: Secondary | ICD-10-CM | POA: Insufficient documentation

## 2024-02-11 DIAGNOSIS — Z9089 Acquired absence of other organs: Secondary | ICD-10-CM | POA: Insufficient documentation

## 2024-02-11 DIAGNOSIS — E039 Hypothyroidism, unspecified: Secondary | ICD-10-CM | POA: Diagnosis not present

## 2024-02-11 DIAGNOSIS — Z885 Allergy status to narcotic agent status: Secondary | ICD-10-CM | POA: Diagnosis not present

## 2024-02-11 DIAGNOSIS — C7800 Secondary malignant neoplasm of unspecified lung: Secondary | ICD-10-CM

## 2024-02-11 DIAGNOSIS — Z7982 Long term (current) use of aspirin: Secondary | ICD-10-CM | POA: Insufficient documentation

## 2024-02-11 DIAGNOSIS — C680 Malignant neoplasm of urethra: Secondary | ICD-10-CM | POA: Diagnosis not present

## 2024-02-11 DIAGNOSIS — I252 Old myocardial infarction: Secondary | ICD-10-CM | POA: Diagnosis not present

## 2024-02-11 DIAGNOSIS — C679 Malignant neoplasm of bladder, unspecified: Secondary | ICD-10-CM | POA: Diagnosis not present

## 2024-02-11 DIAGNOSIS — N1831 Chronic kidney disease, stage 3a: Secondary | ICD-10-CM | POA: Insufficient documentation

## 2024-02-11 DIAGNOSIS — C7910 Secondary malignant neoplasm of unspecified urinary organs: Secondary | ICD-10-CM | POA: Diagnosis not present

## 2024-02-11 DIAGNOSIS — I2510 Atherosclerotic heart disease of native coronary artery without angina pectoris: Secondary | ICD-10-CM | POA: Diagnosis not present

## 2024-02-11 LAB — CMP (CANCER CENTER ONLY)
ALT: 9 U/L (ref 0–44)
AST: 12 U/L — ABNORMAL LOW (ref 15–41)
Albumin: 3.7 g/dL (ref 3.5–5.0)
Alkaline Phosphatase: 82 U/L (ref 38–126)
Anion gap: 6 (ref 5–15)
BUN: 18 mg/dL (ref 8–23)
CO2: 28 mmol/L (ref 22–32)
Calcium: 8.5 mg/dL — ABNORMAL LOW (ref 8.9–10.3)
Chloride: 103 mmol/L (ref 98–111)
Creatinine: 1.12 mg/dL — ABNORMAL HIGH (ref 0.44–1.00)
GFR, Estimated: 52 mL/min — ABNORMAL LOW (ref >60)
Glucose, Bld: 91 mg/dL (ref 70–99)
Potassium: 4.1 mmol/L (ref 3.5–5.1)
Sodium: 137 mmol/L (ref 135–145)
Total Bilirubin: 0.2 mg/dL (ref 0.0–1.2)
Total Protein: 6.2 g/dL — ABNORMAL LOW (ref 6.5–8.1)

## 2024-02-11 LAB — CBC WITH DIFFERENTIAL (CANCER CENTER ONLY)
Abs Immature Granulocytes: 0.02 K/uL (ref 0.00–0.07)
Basophils Absolute: 0 K/uL (ref 0.0–0.1)
Basophils Relative: 1 %
Eosinophils Absolute: 0.1 K/uL (ref 0.0–0.5)
Eosinophils Relative: 2 %
HCT: 34.2 % — ABNORMAL LOW (ref 36.0–46.0)
Hemoglobin: 11.2 g/dL — ABNORMAL LOW (ref 12.0–15.0)
Immature Granulocytes: 0 %
Lymphocytes Relative: 42 %
Lymphs Abs: 2.6 K/uL (ref 0.7–4.0)
MCH: 31.5 pg (ref 26.0–34.0)
MCHC: 32.7 g/dL (ref 30.0–36.0)
MCV: 96.3 fL (ref 80.0–100.0)
Monocytes Absolute: 0.6 K/uL (ref 0.1–1.0)
Monocytes Relative: 10 %
Neutro Abs: 2.9 K/uL (ref 1.7–7.7)
Neutrophils Relative %: 45 %
Platelet Count: 284 K/uL (ref 150–400)
RBC: 3.55 MIL/uL — ABNORMAL LOW (ref 3.87–5.11)
RDW: 15.4 % (ref 11.5–15.5)
WBC Count: 6.3 K/uL (ref 4.0–10.5)
nRBC: 0 % (ref 0.0–0.2)

## 2024-02-11 MED ORDER — SODIUM CHLORIDE 0.9% FLUSH
10.0000 mL | Freq: Once | INTRAVENOUS | Status: AC
  Administered 2024-02-11: 10 mL

## 2024-02-11 NOTE — Progress Notes (Signed)
 Jenna Cordella LABOR, MD  Daralene Ferol FALCON, RT PROCEDURE / BIOPSY REVIEW Date: 02/11/24  Requested Biopsy site: Any pleural based lung nodule Reason for request: CT biopsy Imaging review: Best seen on CT  Decision: Approved Imaging modality to perform: CT Schedule with: Moderate Sedation Schedule for: Any VIR  Additional comments: @Schedulers .   Please contact me with questions, concerns, or if issue pertaining to this request arise.  Cordella LABOR Jenna, MD [facility]       Previous Messages    ----- Message ----- From: Daralene Ferol FALCON, RT Sent: 02/11/2024   2:02 PM EDT To: Channing LITTIE Eagles; Donnovan Stamour F Nijah Tejera, RT; Ir Proc* Subject: CT BIOPSY                                      Procedure : CT Biopsy  Reason : CT-guided core biopsy of her pleural-based pulmonary nodule from the left or the right lung for molecular studies  Dx: Metastatic urothelial carcinoma (HCC) [C79.10 (ICD-10-CM)]  History : CT CHEST ABDOMEN PELVIS WO CONTRAST (Accession [id number]) (Order [id number]), DG Chest Portable 1 View (Accession [id number]) (Order [id number]), NM PET Image Restage (PS) Skull Base to Thigh (F-18 FDG) (Accession [id number]) (Order [id number]), CT Chest Wo Contrast (Accession [id number]) (Order [id number]), DG Hip Unilat W or Wo Pelvis 2-3 Views Right (Accession [id number]) (Order [id number]), CT PELVIS WO CONTRAST (Accession [id number]) (Order [id number]), CT Lumbar Spine Wo Contrast (Accession [id number]) (Order [id number]), MR LUMBAR SPINE WO CONTRAST (Accession [id number]) (Order [id number]), CT Chest Wo Contrast (Accession [id number]) (Order [id number]), CT ABDOMEN WO CONTRAST (Accession [id number]) (Order [id number]) Imaging  Provider: Sherrod Sherrod, MD  Provider contact ; [phone]  [email or web address]

## 2024-02-11 NOTE — Progress Notes (Signed)
 [facility] [phone]   [phone]  OFFICE PROGRESS NOTE  Morgan Lung, MD [address] [place] [address]  DIAGNOSIS: Metastatic urothelial carcinoma that was initially diagnosed as stage II (T2, N0, M0) in September 2022 status post right nephroureterectomy as well as intravesical treatment and resection of superficial tumor in the bladder several times and March 2023 as well as July 2023.  The patient was found to have enlarging and new pulmonary nodules consistent with metastatic disease in November 2023.    PRIOR THERAPY: 1) Right nephroureterectomy as well as intravesical treatment and resection of superficial tumor in the bladder several times and March 2023 as well as July 2023.  2) Palliative systemic chemotherapy with carboplatin  for AUC of 5 on day 1 and gemcitabine  1000 mg/M2 on days 1 and 8 every 3 weeks. First cycle June 21, 2022. Status post 6 cycles. Her dose of carboplatin  was reduced to AUC of 4 and gemcitabine  to 800 mg/m2 starting from cycle #5 due to cytopenias. 3) Maintenance immunotherapy with Avelumab  800 mg IV every 2 weeks. First dose on 11/15/22. Status post 6 cycle of treatment. 4) Erdafitinib  (balversa ) 8 mg p.o daily. First dose 02/22/23. Status post 4 months of treatment. Discontinued in February 2025 due to disease progression.  5) Enfortumab vedotin  infusion therapy: once a week for three weeks, followed by one week off, repeated every four weeks. First dose was on 08/08/2023.  Status post 1 cycle.  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 6) sacituzumab govitecan  (Trodelvy ) 10 mg/kg on days 1 and 8 every 3 weeks. First dose 11/05/2023.  Status post 3 cycles discontinued secondary to disease progression.   CURRENT THERAPY: Keytruda 200 Mg IV every 3 weeks.  First dose 02/18/2024   INTERVAL  HISTORY: Morgan Torres 73 y.o. female returns to the clinic today for follow-up visit accompanied by her husband. Discussed the use of AI scribe software for clinical note transcription with the patient, who gave verbal consent to proceed.  History of Present Illness Morgan Torres is a 73 year old female with metastatic urethral carcinoma who presents for re-evaluation and discussion of treatment options. She is accompanied by her husband. She was referred by Dr. Lonni Torres for further evaluation and management.  She has a history of metastatic urethral carcinoma, initially diagnosed as stage II in September 2022. The disease showed progression in March and July 2023. She has undergone multiple treatments including systemic chemotherapy with carboplatin  and gemcitabine , followed by maintenance immunotherapy with avelumab , which was discontinued due to disease progression.  Subsequent treatments included erdafitinib , which was also discontinued due to disease progression, and enfortumab vedotin , which was stopped after one cycle due to a grade III skin rash with Stevens-Johnson syndrome and toxic epidermal necrolysis. She was then treated with sacituzumab govitecan  for four cycles, which was discontinued due to further disease progression.  She feels 'good' despite the ongoing challenges with her cancer treatment. She recently visited [facility] and saw Dr. Lonni Torres, who discussed the possibility of another biopsy to better understand the current state of her cancer. She feels nervous about the biopsy procedure.  Recent blood work showed mild anemia with a hemoglobin level of 11.2, normal kidney function with a serum creatinine of 1.12, and normal sodium and calcium  levels.     MEDICAL HISTORY: Past Medical History:  Diagnosis Date   Anemia    Anxiety    Arthritis    hip, lumbar spine    Asthma    seasonal allergies    Bronchitis    Hx: of   Chronic kidney disease    COPD  (chronic obstructive pulmonary disease) (HCC)    Coronary artery disease    Depression    Family history of adverse reaction to anesthesia    Sister hard to wake up   Fibromyalgia    Hypertension    Hypothyroidism    Lumbar herniated disc    Metastatic urothelial carcinoma (HCC)    Myocardial infarction (HCC) 06/25/2012   followed by Dr. Mona, treated medically, no stents   Peripheral arterial disease (HCC)    of right foot   Pneumonia    Hx: of yrs ago   Sciatica    Sleep apnea    Small bowel obstruction (HCC)    several yrs ago    ALLERGIES:  is allergic to diclofenac, padcev  [enfortumab vedotin ], ciprofloxacin, codeine, and hydrocodone.  MEDICATIONS:  Current Outpatient Medications  Medication Sig Dispense Refill   acetaminophen  (TYLENOL ) 500 MG tablet Take 1 tablet (500 mg total) by mouth every 6 (six) hours as needed for mild pain. 30 tablet 0   albuterol  (PROVENTIL  HFA;VENTOLIN  HFA) 108 (90 Base) MCG/ACT inhaler Inhale 2 puffs into the lungs every 6 (six) hours as needed for wheezing or shortness of breath. 1 Inhaler 0   amLODipine  (NORVASC ) 10 MG tablet Take 10 mg by mouth daily. (Patient not taking: Reported on 12/24/2023)     aspirin  EC 81 MG tablet Take 81 mg by mouth daily. Swallow whole. (Patient not taking: Reported on 12/24/2023)     benzonatate  (TESSALON ) 100 MG capsule Take 100 mg by mouth 3 (three) times daily as needed for cough.     calcium  acetate (PHOSLO ) 667 MG tablet Take 667 mg by mouth 3 (three) times daily with meals.     cilostazol  (PLETAL ) 50 MG tablet Take 50 mg by mouth 2 (two) times daily.     clotrimazole  (LOTRIMIN ) 1 % external solution Apply topically 2 (two) times daily. (Patient taking differently: Apply 1 Application topically 2 (two) times daily.) 30 mL 0   cyclobenzaprine  (FLEXERIL ) 10 MG tablet Take 1 tablet (10 mg total) by mouth 2 (two) times daily as needed for muscle spasms. 20 tablet 0   EPINEPHrine  (EPIPEN  2-PAK) 0.3 mg/0.3 mL DEVI Inject  0.3 mLs (0.3 mg total) into the muscle once as needed (for severe allergic reaction). CAll 911 immediately if you have to use this medicine (Patient taking differently: Inject 0.3 mg into the muscle once as needed (for a severe allergic reaction- CALL 9-1-1 IMMEDIATELY IF USED).) 1 Device 1   gabapentin  (NEURONTIN ) 300 MG capsule Take 300 mg by mouth at bedtime.     guaiFENesin  (MUCINEX ) 600 MG 12 hr tablet Take 2 tablets (1,200 mg total) by mouth 2 (two) times daily. (Patient taking differently: Take 1,200 mg by mouth 2 (two) times daily as needed for to loosen phlegm.) 120 tablet 0   ipratropium (ATROVENT ) 0.06 % nasal spray Place 2 sprays into the nose daily as needed for rhinitis.     lidocaine -prilocaine  (EMLA ) cream Apply 1 Application topically as needed. (Patient taking differently: Apply 1 Application topically as needed (port).) 30 g 2   LORazepam  (ATIVAN ) 0.5 MG tablet Take 0.5 mg by mouth daily as needed for anxiety.     magnesium  oxide (MAG-OX) 400 (240  Mg) MG tablet TAKE 1 TABLET BY MOUTH EVERY DAY 90 tablet 1   methocarbamol  (ROBAXIN ) 500 MG tablet Take 1 tablet (500 mg total) by mouth 2 (two) times daily. 20 tablet 0   metoprolol  tartrate (LOPRESSOR ) 25 MG tablet Take 1 tablet by mouth 2 (two) times daily. (Patient taking differently: Take 1 tablet by mouth as needed.)     MILK OF MAGNESIA 400 MG/5ML suspension Take 15-30 mLs by mouth daily as needed for mild constipation.     montelukast  (SINGULAIR ) 10 MG tablet Take 10 mg by mouth daily.     nicotine  (NICODERM CQ  - DOSED IN MG/24 HOURS) 21 mg/24hr patch Place 21 mg onto the skin daily.     nicotine  polacrilex (NICORETTE ) 4 MG gum Take 4 mg by mouth as needed for smoking cessation.     nitroGLYCERIN  (NITROSTAT ) 0.4 MG SL tablet Place 1 tablet (0.4 mg total) under the tongue every 5 (five) minutes x 3 doses as needed for chest pain. 25 tablet 2   oxybutynin  (DITROPAN ) 5 MG tablet Take 1 tablet (5 mg total) by mouth every 8 (eight) hours  as needed for bladder spasms. 30 tablet 1   oxyCODONE  (ROXICODONE ) 5 MG immediate release tablet Take 1 tablet (5 mg total) by mouth every 4 (four) hours as needed for severe pain (pain score 7-10). 15 tablet 0   oxyCODONE -acetaminophen  (PERCOCET/ROXICET) 5-325 MG tablet Take 1 tablet by mouth every 6 (six) hours as needed for severe pain (pain score 7-10). 15 tablet 0   phenazopyridine  (PYRIDIUM ) 200 MG tablet Take 1 tablet (200 mg total) by mouth 3 (three) times daily as needed (for pain with urination). 30 tablet 0   Potassium & Sodium Phosphates (PHOSPHORUS  W/SOD & POTASSIUM) 280-160-250 MG PACK TAKE 1 PACKAGE BY MOUTH 2 (TWO) TIMES DAILY. 30 each 1   prochlorperazine  (COMPAZINE ) 10 MG tablet Take 1 tablet (10 mg total) by mouth every 6 (six) hours as needed. 30 tablet 2   sodium chloride  1 g tablet TAKE 1 TABLET (1 G TOTAL) BY MOUTH DAILY. SPLIT 1 GRAM INTO TWO HALVES AND TAKE BY MOUTH ONCE A DAY (Patient taking differently: Take 0.5 g by mouth in the morning and at bedtime.) 30 tablet 0   SYNTHROID  100 MCG tablet TAKE 1 TABLET BY MOUTH DAILY BEFORE BREAKFAST. 90 tablet 1   temazepam  (RESTORIL ) 15 MG capsule Take 1 capsule (15 mg total) by mouth at bedtime as needed for sleep. 30 capsule 0   traMADol  (ULTRAM ) 50 MG tablet Take 50 mg by mouth every 6 (six) hours as needed (for pain).     No current facility-administered medications for this visit.    SURGICAL HISTORY:  Past Surgical History:  Procedure Laterality Date   ABDOMINAL SURGERY     resection of small intestine   ANTERIOR CRUCIATE LIGAMENT REPAIR Bilateral    BACK SURGERY     fusion of lumbar   CARDIAC CATHETERIZATION  06/25/2012   COLON SURGERY     resection for bowel - for obstruction  6 to 7 yrs ago per pt on 01-05-2022   COLONOSCOPY     Hx; of   DILATION AND CURETTAGE OF UTERUS     EYE SURGERY Bilateral    cataracts removed   HERNIA REPAIR  02/23/1969   inguinal hernia    IR IMAGING GUIDED PORT INSERTION  06/07/2022    LEFT HEART CATHETERIZATION WITH CORONARY ANGIOGRAM N/A 03/30/2013   Procedure: LEFT HEART CATHETERIZATION WITH CORONARY ANGIOGRAM;  Surgeon:  Alm LELON Clay, MD;  Location: [facility] CATH LAB;  Service: Cardiovascular;  Laterality: N/A;   left thumb surgery     yrs ago   ROBOT ASSITED LAPAROSCOPIC NEPHROURETERECTOMY Left 03/09/2021   Procedure: XI ROBOT ASSITED LAPAROSCOPIC NEPHROURETERECTOMY/ CYSTOSCOPY WITH LEFT URETEROSCOPY WITH TRANSURETHRAL RESECTION OF URETERAL ORIFICE;  Surgeon: Devere Morgan Righter, MD;  Location: [facility] ORS;  Service: Urology;  Laterality: Left;   SALIVARY GLAND SURGERY Left    approached from inside mouth & side of neck 1970-1980   TONSILLECTOMY     as child   TOTAL HIP ARTHROPLASTY Left 05/28/2014   Procedure: LEFT TOTAL HIP ARTHROPLASTY;  Surgeon: LELON JONETTA Shari Mickey., MD;  Location: [facility] OR;  Service: Orthopedics;  Laterality: Left;   TRANSURETHRAL RESECTION OF BLADDER TUMOR N/A 08/23/2021   Procedure: TRANSURETHRAL RESECTION OF BLADDER TUMOR (TURBT) WITH CYSTOSCOPY/ POSTOPERATIVE INSTILLATION OF GEMCITABINE /retrograde pyelogram and stent placement (right);  Surgeon: Devere Morgan Righter, MD;  Location: [facility];  Service: Urology;  Laterality: N/A;   TRANSURETHRAL RESECTION OF BLADDER TUMOR N/A 01/10/2022   Procedure: TRANSURETHRAL RESECTION OF BLADDER TUMOR (TURBT) WITH CYSTOSCOPY / GEMCITABINE  INSTILLATION post-operatively;  Surgeon: Devere Morgan Righter, MD;  Location: [facility];  Service: Urology;  Laterality: N/A;  ONLY NEEDS 30 MIN   TRANSURETHRAL RESECTION OF BLADDER TUMOR N/A 12/25/2023   Procedure: TURBT (TRANSURETHRAL RESECTION OF BLADDER TUMOR);  Surgeon: Devere Morgan Righter, MD;  Location: [facility];  Service: Urology;  Laterality: N/A;  CYSTOSCOPY WITH TURBT (TRANSURETHRAL RESECTION OF BLADDER TUMOR)    REVIEW OF SYSTEMS:  Constitutional: positive for fatigue Eyes: negative Ears, nose, mouth, throat, and face:  negative Respiratory: negative Cardiovascular: negative Gastrointestinal: negative Genitourinary:negative Integument/breast: negative Hematologic/lymphatic: negative Musculoskeletal:negative Neurological: negative Behavioral/Psych: negative Endocrine: negative Allergic/Immunologic: negative   PHYSICAL EXAMINATION: General appearance: alert, cooperative, fatigued, and no distress Head: Normocephalic, without obvious abnormality, atraumatic Neck: no adenopathy, no JVD, supple, symmetrical, trachea midline, and thyroid  not enlarged, symmetric, no tenderness/mass/nodules Lymph nodes: Cervical, supraclavicular, and axillary nodes normal. Resp: clear to auscultation bilaterally Back: symmetric, no curvature. ROM normal. No CVA tenderness. Cardio: regular rate and rhythm, S1, S2 normal, no murmur, click, rub or gallop GI: soft, non-tender; bowel sounds normal; no masses,  no organomegaly Extremities: extremities normal, atraumatic, no cyanosis or edema Neurologic: Alert and oriented X 3, normal strength and tone. Normal symmetric reflexes. Normal coordination and gait  ECOG PERFORMANCE STATUS: 1 - Symptomatic but completely ambulatory  Blood pressure (!) 145/77, pulse 69, temperature 98.2 F (36.8 C), temperature source Temporal, resp. rate 17, height 5' 8 (1.727 m), weight 165 lb (74.8 kg), SpO2 100%.  LABORATORY DATA: Lab Results  Component Value Date   WBC 6.3 02/11/2024   HGB 11.2 (L) 02/11/2024   HCT 34.2 (L) 02/11/2024   MCV 96.3 02/11/2024   PLT 284 02/11/2024      Chemistry      Component Value Date/Time   NA 139 01/07/2024 0851   K 4.3 01/07/2024 0851   CL 104 01/07/2024 0851   CO2 27 01/07/2024 0851   BUN 21 01/07/2024 0851   CREATININE 1.30 (H) 01/07/2024 0851      Component Value Date/Time   CALCIUM  9.3 01/07/2024 0851   ALKPHOS 83 01/07/2024 0851   AST 19 01/07/2024 0851   ALT 13 01/07/2024 0851   BILITOT 0.3 01/07/2024 0851       RADIOGRAPHIC  STUDIES: No results found.   ASSESSMENT AND PLAN: This is a very pleasant 73 years old  African-American female with metastatic urothelial carcinoma that was initially diagnosed as stage II in September 2022 but has evidence for disease progression with pulmonary metastasis in November 2023.  She underwent several treatment in the past including systemic chemotherapy with carboplatin  and gemcitabine  followed by maintenance treatment with Avelumab  discontinued secondary to disease progression.  Then the patient underwent treatment with erdafitinib  for 4 months discontinued secondary to disease progression.  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  She was seen for a second opinion by Dr. Joyice at [facility] and she recommended retesting for HER2 expression which unfortunately came back negative.  It was recommended for the patient to consider treatment with Sacituzumab Govitecan .  She started treatment with sacituzumab govitecan  at dose of 10 Mg/KG on days 1 and 8 every 3 weeks.  First dose was 11/06/2023.  She is status post 3 cycles.  Last dose was given 12/17/2023 discontinued secondary to disease progression. The patient had repeat CT scan of the chest, abdomen and pelvis performed recently.  I personally and independently reviewed the scan.  Unfortunately her scan showed evidence for disease progression. I had a lengthy discussion with the patient and her sister about her current condition and treatment options. Disease progression despite multiple lines of systemic chemotherapy, including carboplatin , gemcitabine , avelumab , idrafate, enfortumab vedotin , and sacituzumab govetican. Recent CT scan shows continued cancer growth. Limited treatment options remain, necessitating consideration of clinical trials. She was referred to [facility] cancer center and seen by Dr. Georgiann.  He recommended repeating a biopsy for additional molecular studies.  He also recommended treatment with Keytruda for now. Assessment and Plan Assessment & Plan Metastatic urethral carcinoma with Torres metastases Metastatic urethral carcinoma with progression noted in March and July 2023. Previous treatments include carboplatin  and gemcitabine , avelumab , erdafitinib , enfortumab vedotin , and sacituzumab govitecan , all discontinued due to disease progression or adverse effects. Current evaluation for further treatment options. Discussion of biopsy to retest for genetic molecular markers via next-generation sequencing to determine if Torres metastases are behaving differently from the primary bladder cancer. Biopsy options include percutaneous needle biopsy or bronchoscopy. Initiation of pembrolizumab Sigmund) as interim treatment. Discussed potential side effects of pembrolizumab, including inflammation in any organ, and plan for close monitoring. - Initiate pembrolizumab (Keytruda) treatment next week. - Arrange for Torres nodule biopsy via percutaneous needle biopsy or bronchoscopy. - Send biopsy tissue for genetic molecular marker testing. - Monitor for side effects of pembrolizumab, including organ inflammation.  Anemia Mild anemia with hemoglobin level at 11.2 g/dL. She was advised to call immediately if she has any concerning symptoms in the interval. The patient voices understanding of current disease status and treatment options and is in agreement with the current care plan.  All questions were answered. The patient knows to call the clinic with any problems, questions or concerns. We can certainly see the patient much sooner if necessary.  The total time spent in the appointment was 30 minutes including review of chart and various tests results, discussions about plan of care and coordination of care plan .   Disclaimer: This note was dictated  with voice recognition software. Similar sounding words can inadvertently be transcribed and may not be corrected upon review.

## 2024-02-11 NOTE — Progress Notes (Signed)
 DISCONTINUE OFF PATHWAY REGIMEN - Bladder   [id number]:Sacituzumab govitecan  10 mg/kg IV D1,8 q21 Days:   A cycle is every 21 days:     Sacituzumab govitecan -hziy   **Always confirm dose/schedule in your pharmacy ordering system**  PRIOR TREATMENT: Off Pathway: Sacituzumab govitecan  10 mg/kg IV D1,8 q21 Days  START OFF PATHWAY REGIMEN - Bladder   [id number]:Pembrolizumab 200 mg IV D1 q21 Days:   A cycle is every 21 days:     Pembrolizumab   **Always confirm dose/schedule in your pharmacy ordering system**  Patient Characteristics: Advanced/Metastatic Disease, Third Line and Beyond, HER2 Negative/Unknown Therapeutic Status: Advanced/Metastatic Disease Line of Therapy: Third Line and Beyond HER2 Expression Status by IHC: Negative (IHC 0, 1+) Intent of Therapy: Non-Curative / Palliative Intent, Discussed with Patient

## 2024-02-12 ENCOUNTER — Other Ambulatory Visit: Payer: Self-pay

## 2024-02-13 ENCOUNTER — Telehealth: Payer: Self-pay | Admitting: Internal Medicine

## 2024-02-13 ENCOUNTER — Other Ambulatory Visit: Payer: Self-pay

## 2024-02-13 NOTE — Telephone Encounter (Signed)
 I attempted to contact the patient 3 times. I could hear the patient but she could not hear me and eventually hung up. I contacted the patients husband and he said he would give her my direct line and have her call me. The appointments have already been schedule I am just waiting on a call back.

## 2024-02-13 NOTE — Telephone Encounter (Signed)
 Confirmed all scheduled appointments with the patient.

## 2024-02-14 ENCOUNTER — Other Ambulatory Visit: Payer: Self-pay

## 2024-02-18 ENCOUNTER — Encounter: Payer: Self-pay | Admitting: Internal Medicine

## 2024-02-18 ENCOUNTER — Inpatient Hospital Stay

## 2024-02-18 VITALS — BP 131/77 | HR 83 | Temp 98.4°F | Resp 16 | Wt 164.0 lb

## 2024-02-18 DIAGNOSIS — Z9089 Acquired absence of other organs: Secondary | ICD-10-CM | POA: Diagnosis not present

## 2024-02-18 DIAGNOSIS — I129 Hypertensive chronic kidney disease with stage 1 through stage 4 chronic kidney disease, or unspecified chronic kidney disease: Secondary | ICD-10-CM | POA: Diagnosis not present

## 2024-02-18 DIAGNOSIS — D649 Anemia, unspecified: Secondary | ICD-10-CM | POA: Diagnosis not present

## 2024-02-18 DIAGNOSIS — Z881 Allergy status to other antibiotic agents status: Secondary | ICD-10-CM | POA: Diagnosis not present

## 2024-02-18 DIAGNOSIS — I2510 Atherosclerotic heart disease of native coronary artery without angina pectoris: Secondary | ICD-10-CM | POA: Diagnosis not present

## 2024-02-18 DIAGNOSIS — I252 Old myocardial infarction: Secondary | ICD-10-CM | POA: Diagnosis not present

## 2024-02-18 DIAGNOSIS — C680 Malignant neoplasm of urethra: Secondary | ICD-10-CM | POA: Diagnosis not present

## 2024-02-18 DIAGNOSIS — Z7982 Long term (current) use of aspirin: Secondary | ICD-10-CM | POA: Diagnosis not present

## 2024-02-18 DIAGNOSIS — Z9226 Personal history of immune checkpoint inhibitor therapy: Secondary | ICD-10-CM | POA: Diagnosis not present

## 2024-02-18 DIAGNOSIS — Z885 Allergy status to narcotic agent status: Secondary | ICD-10-CM | POA: Diagnosis not present

## 2024-02-18 DIAGNOSIS — E039 Hypothyroidism, unspecified: Secondary | ICD-10-CM | POA: Diagnosis not present

## 2024-02-18 DIAGNOSIS — C7800 Secondary malignant neoplasm of unspecified lung: Secondary | ICD-10-CM | POA: Diagnosis not present

## 2024-02-18 DIAGNOSIS — C7910 Secondary malignant neoplasm of unspecified urinary organs: Secondary | ICD-10-CM

## 2024-02-18 DIAGNOSIS — C679 Malignant neoplasm of bladder, unspecified: Secondary | ICD-10-CM

## 2024-02-18 DIAGNOSIS — Z95828 Presence of other vascular implants and grafts: Secondary | ICD-10-CM

## 2024-02-18 DIAGNOSIS — R21 Rash and other nonspecific skin eruption: Secondary | ICD-10-CM | POA: Diagnosis not present

## 2024-02-18 DIAGNOSIS — N1831 Chronic kidney disease, stage 3a: Secondary | ICD-10-CM | POA: Diagnosis not present

## 2024-02-18 DIAGNOSIS — Z79899 Other long term (current) drug therapy: Secondary | ICD-10-CM | POA: Diagnosis not present

## 2024-02-18 DIAGNOSIS — L511 Stevens-Johnson syndrome: Secondary | ICD-10-CM | POA: Diagnosis not present

## 2024-02-18 LAB — CMP (CANCER CENTER ONLY)
ALT: 10 U/L (ref 0–44)
AST: 13 U/L — ABNORMAL LOW (ref 15–41)
Albumin: 3.8 g/dL (ref 3.5–5.0)
Alkaline Phosphatase: 93 U/L (ref 38–126)
Anion gap: 5 (ref 5–15)
BUN: 19 mg/dL (ref 8–23)
CO2: 27 mmol/L (ref 22–32)
Calcium: 9.4 mg/dL (ref 8.9–10.3)
Chloride: 104 mmol/L (ref 98–111)
Creatinine: 1.16 mg/dL — ABNORMAL HIGH (ref 0.44–1.00)
GFR, Estimated: 49 mL/min — ABNORMAL LOW (ref >60)
Glucose, Bld: 101 mg/dL — ABNORMAL HIGH (ref 70–99)
Potassium: 4.8 mmol/L (ref 3.5–5.1)
Sodium: 136 mmol/L (ref 135–145)
Total Bilirubin: 0.3 mg/dL (ref 0.0–1.2)
Total Protein: 6.7 g/dL (ref 6.5–8.1)

## 2024-02-18 LAB — CBC WITH DIFFERENTIAL (CANCER CENTER ONLY)
Abs Immature Granulocytes: 0.02 K/uL (ref 0.00–0.07)
Basophils Absolute: 0 K/uL (ref 0.0–0.1)
Basophils Relative: 1 %
Eosinophils Absolute: 0 K/uL (ref 0.0–0.5)
Eosinophils Relative: 1 %
HCT: 37.9 % (ref 36.0–46.0)
Hemoglobin: 12.3 g/dL (ref 12.0–15.0)
Immature Granulocytes: 0 %
Lymphocytes Relative: 31 %
Lymphs Abs: 2.2 K/uL (ref 0.7–4.0)
MCH: 31.1 pg (ref 26.0–34.0)
MCHC: 32.5 g/dL (ref 30.0–36.0)
MCV: 95.9 fL (ref 80.0–100.0)
Monocytes Absolute: 0.4 K/uL (ref 0.1–1.0)
Monocytes Relative: 6 %
Neutro Abs: 4.3 K/uL (ref 1.7–7.7)
Neutrophils Relative %: 61 %
Platelet Count: 310 K/uL (ref 150–400)
RBC: 3.95 MIL/uL (ref 3.87–5.11)
RDW: 15.3 % (ref 11.5–15.5)
WBC Count: 7 K/uL (ref 4.0–10.5)
nRBC: 0 % (ref 0.0–0.2)

## 2024-02-18 LAB — TSH: TSH: 11 u[IU]/mL — ABNORMAL HIGH (ref 0.350–4.500)

## 2024-02-18 MED ORDER — SODIUM CHLORIDE 0.9 % IV SOLN: INTRAVENOUS | Status: DC

## 2024-02-18 MED ORDER — SODIUM CHLORIDE 0.9% FLUSH
10.0000 mL | Freq: Once | INTRAVENOUS | Status: AC
  Administered 2024-02-18: 10 mL

## 2024-02-18 MED ORDER — SODIUM CHLORIDE 0.9 % IV SOLN
200.0000 mg | Freq: Once | INTRAVENOUS | Status: AC
  Administered 2024-02-18: 200 mg via INTRAVENOUS
  Filled 2024-02-18: qty 200

## 2024-02-18 NOTE — Patient Instructions (Signed)
 [facility] - A DEPT OF [facility]  Discharge Instructions: Thank you for choosing [facility] to provide your oncology and hematology care.   If you have a lab appointment with the Cancer Center, please go directly to the Cancer Center and check in at the registration area.   Wear comfortable clothing and clothing appropriate for easy access to any Portacath or PICC line.   We strive to give you quality time with your provider. You may need to reschedule your appointment if you arrive late (15 or more minutes).  Arriving late affects you and other patients whose appointments are after yours.  Also, if you miss three or more appointments without notifying the office, you may be dismissed from the clinic at the provider's discretion.      For prescription refill requests, have your pharmacy contact our office and allow 72 hours for refills to be completed.    Today you received the following chemotherapy and/or immunotherapy agents Keytruda      To help prevent nausea and vomiting after your treatment, we encourage you to take your nausea medication as directed.  BELOW ARE SYMPTOMS THAT SHOULD BE REPORTED IMMEDIATELY: *FEVER GREATER THAN 100.4 F (38 C) OR HIGHER *CHILLS OR SWEATING *NAUSEA AND VOMITING THAT IS NOT CONTROLLED WITH YOUR NAUSEA MEDICATION *UNUSUAL SHORTNESS OF BREATH *UNUSUAL BRUISING OR BLEEDING *URINARY PROBLEMS (pain or burning when urinating, or frequent urination) *BOWEL PROBLEMS (unusual diarrhea, constipation, pain near the anus) TENDERNESS IN MOUTH AND THROAT WITH OR WITHOUT PRESENCE OF ULCERS (sore throat, sores in mouth, or a toothache) UNUSUAL RASH, SWELLING OR PAIN  UNUSUAL VAGINAL DISCHARGE OR ITCHING   Items with * indicate a potential emergency and should be followed up as soon as possible or go to the Emergency Department if any problems should occur.  Please show the CHEMOTHERAPY ALERT CARD or IMMUNOTHERAPY  ALERT CARD at check-in to the Emergency Department and triage nurse.  Should you have questions after your visit or need to cancel or reschedule your appointment, please contact [facility] - A DEPT OF [facility]  Dept: [phone]  and follow the prompts.  Office hours are 8:00 a.m. to 4:30 p.m. Monday - Friday. Please note that voicemails left after 4:00 p.m. may not be returned until the following business day.  We are closed weekends and major holidays. You have access to a nurse at all times for urgent questions. Please call the main number to the clinic Dept: [phone] and follow the prompts.   For any non-urgent questions, you may also contact your provider using MyChart. We now offer e-Visits for anyone 88 and older to request care online for non-urgent symptoms. For details visit [email or web address].   Also download the MyChart app! Go to the app store, search "MyChart", open the app, select [place], and log in with your MyChart username and password.   Pembrolizumab Injection What is this medication? PEMBROLIZUMAB (PEM broe LIZ ue mab) treats some types of cancer. It works by helping your immune system slow or stop the spread of cancer cells. It is a monoclonal antibody. This medicine may be used for other purposes; ask your health care provider or pharmacist if you have questions. COMMON BRAND NAME(S): Keytruda What should I tell my care team before I take this medication? They need to know if you have any of these conditions: Allogeneic stem cell transplant (uses someone else's stem  cells) Autoimmune diseases, such as Crohn disease, ulcerative colitis, lupus History of chest radiation Nervous system problems, such as Guillain-Barre syndrome, myasthenia gravis Organ transplant An unusual or allergic reaction to pembrolizumab, other medications, foods, dyes, or preservatives Pregnant or trying to get pregnant Breast-feeding How should I  use this medication? This medication is injected into a vein. It is given by your care team in a hospital or clinic setting. A special MedGuide will be given to you before each treatment. Be sure to read this information carefully each time. Talk to your care team about the use of this medication in children. While it may be prescribed for children as young as 6 months for selected conditions, precautions do apply. Overdosage: If you think you have taken too much of this medicine contact a poison control center or emergency room at once. NOTE: This medicine is only for you. Do not share this medicine with others. What if I miss a dose? Keep appointments for follow-up doses. It is important not to miss your dose. Call your care team if you are unable to keep an appointment. What may interact with this medication? Interactions have not been studied. This list may not describe all possible interactions. Give your health care provider a list of all the medicines, herbs, non-prescription drugs, or dietary supplements you use. Also tell them if you smoke, drink alcohol, or use illegal drugs. Some items may interact with your medicine. What should I watch for while using this medication? Your condition will be monitored carefully while you are receiving this medication. You may need blood work while taking this medication. This medication may cause serious skin reactions. They can happen weeks to months after starting the medication. Contact your care team right away if you notice fevers or flu-like symptoms with a rash. The rash may be red or purple and then turn into blisters or peeling of the skin. You may also notice a red rash with swelling of the face, lips, or lymph nodes in your neck or under your arms. Tell your care team right away if you have any change in your eyesight. Talk to your care team if you may be pregnant. Serious birth defects can occur if you take this medication during pregnancy and  for 4 months after the last dose. You will need a negative pregnancy test before starting this medication. Contraception is recommended while taking this medication and for 4 months after the last dose. Your care team can help you find the option that works for you. Do not breastfeed while taking this medication and for 4 months after the last dose. What side effects may I notice from receiving this medication? Side effects that you should report to your care team as soon as possible: Allergic reactions--skin rash, itching, hives, swelling of the face, lips, tongue, or throat Dry cough, shortness of breath or trouble breathing Eye pain, redness, irritation, or discharge with blurry or decreased vision Heart muscle inflammation--unusual weakness or fatigue, shortness of breath, chest pain, fast or irregular heartbeat, dizziness, swelling of the ankles, feet, or hands Hormone gland problems--headache, sensitivity to light, unusual weakness or fatigue, dizziness, fast or irregular heartbeat, increased sensitivity to cold or heat, excessive sweating, constipation, hair loss, increased thirst or amount of urine, tremors or shaking, irritability Infusion reactions--chest pain, shortness of breath or trouble breathing, feeling faint or lightheaded Kidney injury (glomerulonephritis)--decrease in the amount of urine, red or dark brown urine, foamy or bubbly urine, swelling of the ankles, hands, or  feet Liver injury--right upper belly pain, loss of appetite, nausea, light-colored stool, dark yellow or brown urine, yellowing skin or eyes, unusual weakness or fatigue Pain, tingling, or numbness in the hands or feet, muscle weakness, change in vision, confusion or trouble speaking, loss of balance or coordination, trouble walking, seizures Rash, fever, and swollen lymph nodes Redness, blistering, peeling, or loosening of the skin, including inside the mouth Sudden or severe stomach pain, bloody diarrhea, fever,  nausea, vomiting Side effects that usually do not require medical attention (report to your care team if they continue or are bothersome): Bone, joint, or muscle pain Diarrhea Fatigue Loss of appetite Nausea Skin rash This list may not describe all possible side effects. Call your doctor for medical advice about side effects. You may report side effects to FDA at 1-800-FDA-1088. Where should I keep my medication? This medication is given in a hospital or clinic. It will not be stored at home. NOTE: This sheet is a summary. It may not cover all possible information. If you have questions about this medicine, talk to your doctor, pharmacist, or health care provider.  2024 [employer]vier/Gold Standard (2021-10-24 00:00:00)

## 2024-02-19 ENCOUNTER — Other Ambulatory Visit (HOSPITAL_COMMUNITY): Payer: Self-pay | Admitting: Radiology

## 2024-02-19 ENCOUNTER — Telehealth: Payer: Self-pay

## 2024-02-19 DIAGNOSIS — C7800 Secondary malignant neoplasm of unspecified lung: Secondary | ICD-10-CM

## 2024-02-19 LAB — T4: T4, Total: 9.8 ug/dL (ref 4.5–12.0)

## 2024-02-19 NOTE — Telephone Encounter (Signed)
 Dr. Sherrod first time Keytruda  f/u call - pt tolerated well Received: Morgan Metro Katrinka DELENA, RN  P Onc Triage Nurse [facility] Caller: Unspecified (Yesterday,  3:59 PM)

## 2024-02-19 NOTE — Telephone Encounter (Signed)
 Morgan Torres states that she is doing fine. She is eating, drinking, and urinating well. She knows to call the office at [phone] if  she has any questions or concerns.

## 2024-02-20 ENCOUNTER — Ambulatory Visit (HOSPITAL_COMMUNITY)
Admission: RE | Admit: 2024-02-20 | Discharge: 2024-02-20 | Disposition: A | Discharge status: Home / Self Care | Source: Ambulatory Visit | Attending: Diagnostic Radiology | Admitting: Diagnostic Radiology

## 2024-02-20 ENCOUNTER — Other Ambulatory Visit: Payer: Self-pay

## 2024-02-20 ENCOUNTER — Ambulatory Visit (HOSPITAL_COMMUNITY)
Admission: RE | Admit: 2024-02-20 | Discharge: 2024-02-20 | Disposition: A | Discharge status: Home / Self Care | Source: Ambulatory Visit | Attending: Internal Medicine | Admitting: Internal Medicine

## 2024-02-20 DIAGNOSIS — I129 Hypertensive chronic kidney disease with stage 1 through stage 4 chronic kidney disease, or unspecified chronic kidney disease: Secondary | ICD-10-CM | POA: Diagnosis not present

## 2024-02-20 DIAGNOSIS — Z48813 Encounter for surgical aftercare following surgery on the respiratory system: Secondary | ICD-10-CM | POA: Diagnosis not present

## 2024-02-20 DIAGNOSIS — Z87891 Personal history of nicotine dependence: Secondary | ICD-10-CM | POA: Diagnosis not present

## 2024-02-20 DIAGNOSIS — R918 Other nonspecific abnormal finding of lung field: Secondary | ICD-10-CM | POA: Diagnosis not present

## 2024-02-20 DIAGNOSIS — J4489 Other specified chronic obstructive pulmonary disease: Secondary | ICD-10-CM | POA: Insufficient documentation

## 2024-02-20 DIAGNOSIS — C7802 Secondary malignant neoplasm of left lung: Secondary | ICD-10-CM | POA: Diagnosis not present

## 2024-02-20 DIAGNOSIS — Z79899 Other long term (current) drug therapy: Secondary | ICD-10-CM | POA: Insufficient documentation

## 2024-02-20 DIAGNOSIS — R911 Solitary pulmonary nodule: Secondary | ICD-10-CM | POA: Insufficient documentation

## 2024-02-20 DIAGNOSIS — C679 Malignant neoplasm of bladder, unspecified: Secondary | ICD-10-CM | POA: Insufficient documentation

## 2024-02-20 DIAGNOSIS — I252 Old myocardial infarction: Secondary | ICD-10-CM | POA: Diagnosis not present

## 2024-02-20 DIAGNOSIS — N182 Chronic kidney disease, stage 2 (mild): Secondary | ICD-10-CM | POA: Diagnosis not present

## 2024-02-20 DIAGNOSIS — E039 Hypothyroidism, unspecified: Secondary | ICD-10-CM | POA: Diagnosis not present

## 2024-02-20 DIAGNOSIS — Z7982 Long term (current) use of aspirin: Secondary | ICD-10-CM | POA: Diagnosis not present

## 2024-02-20 DIAGNOSIS — Z79890 Hormone replacement therapy (postmenopausal): Secondary | ICD-10-CM | POA: Insufficient documentation

## 2024-02-20 DIAGNOSIS — C7800 Secondary malignant neoplasm of unspecified lung: Secondary | ICD-10-CM | POA: Diagnosis not present

## 2024-02-20 DIAGNOSIS — C689 Malignant neoplasm of urinary organ, unspecified: Secondary | ICD-10-CM | POA: Diagnosis not present

## 2024-02-20 DIAGNOSIS — C7910 Secondary malignant neoplasm of unspecified urinary organs: Secondary | ICD-10-CM

## 2024-02-20 DIAGNOSIS — R590 Localized enlarged lymph nodes: Secondary | ICD-10-CM | POA: Diagnosis not present

## 2024-02-20 LAB — CBC
HCT: 36.6 % (ref 36.0–46.0)
Hemoglobin: 11.7 g/dL — ABNORMAL LOW (ref 12.0–15.0)
MCH: 31.3 pg (ref 26.0–34.0)
MCHC: 32 g/dL (ref 30.0–36.0)
MCV: 97.9 fL (ref 80.0–100.0)
Platelets: 282 K/uL (ref 150–400)
RBC: 3.74 MIL/uL — ABNORMAL LOW (ref 3.87–5.11)
RDW: 15.6 % — ABNORMAL HIGH (ref 11.5–15.5)
WBC: 7.1 K/uL (ref 4.0–10.5)
nRBC: 0 % (ref 0.0–0.2)

## 2024-02-20 LAB — PROTIME-INR
INR: 1 (ref 0.8–1.2)
Prothrombin Time: 13.6 s (ref 11.4–15.2)

## 2024-02-20 MED ORDER — FENTANYL CITRATE (PF) 100 MCG/2ML IJ SOLN
INTRAMUSCULAR | Status: AC
  Filled 2024-02-20: qty 2

## 2024-02-20 MED ORDER — LIDOCAINE 1 % OPTIME INJ - NO CHARGE
10.0000 mL | Freq: Once | INTRAMUSCULAR | Status: DC
  Filled 2024-02-20: qty 10

## 2024-02-20 MED ORDER — DIPHENHYDRAMINE HCL 50 MG/ML IJ SOLN
INTRAMUSCULAR | Status: AC
Start: 2024-02-20 — End: 2024-02-20
  Filled 2024-02-20: qty 1

## 2024-02-20 MED ORDER — MIDAZOLAM HCL 2 MG/2ML IJ SOLN
INTRAMUSCULAR | Status: AC | PRN
  Administered 2024-02-20: .5 mg via INTRAVENOUS
  Administered 2024-02-20: 1 mg via INTRAVENOUS

## 2024-02-20 MED ORDER — MIDAZOLAM HCL 2 MG/2ML IJ SOLN
INTRAMUSCULAR | Status: AC
  Filled 2024-02-20: qty 2

## 2024-02-20 MED ORDER — HEPARIN SOD (PORK) LOCK FLUSH 100 UNIT/ML IV SOLN
500.0000 [IU] | INTRAVENOUS | Status: AC | PRN
  Administered 2024-02-20: 500 [IU]
  Filled 2024-02-20: qty 5

## 2024-02-20 MED ORDER — DIPHENHYDRAMINE HCL 50 MG/ML IJ SOLN
INTRAMUSCULAR | Status: AC | PRN
  Administered 2024-02-20: 25 mg via INTRAVENOUS

## 2024-02-20 MED ORDER — FENTANYL CITRATE (PF) 100 MCG/2ML IJ SOLN
INTRAMUSCULAR | Status: AC | PRN
  Administered 2024-02-20 (×2): 25 ug via INTRAVENOUS
  Administered 2024-02-20: 50 ug via INTRAVENOUS

## 2024-02-20 MED ORDER — SODIUM CHLORIDE 0.9 % IV SOLN: INTRAVENOUS | Status: DC

## 2024-02-20 MED ORDER — ONDANSETRON HCL 4 MG/2ML IJ SOLN
INTRAMUSCULAR | Status: AC
  Filled 2024-02-20: qty 2

## 2024-02-20 NOTE — Progress Notes (Signed)
 PT ambulated to the bathroom with nurse and husband, no s/s of complications. Discharge instruction reviewed with patient sister and husband at bedside. PT escorted from the unit via wheel chair to personal vehicle.

## 2024-02-20 NOTE — Procedures (Signed)
 Interventional Radiology Procedure:   Indications: Metastatic urothelial carcinoma   Procedure: CT guided biopsy of left lung nodule  Findings: 2 core biopsies obtained from the subpleural nodule in left lower lobe superior segment  Complications: None     EBL: Minimal  Plan: CXR in 1 hour.  Anticipate discharge to home later today.   Rajendra Spiller R. Philip, MD  Pager: [phone]

## 2024-02-20 NOTE — H&P (Signed)
 Chief Complaint: R lung nodule  Referring Provider(s): Mohamed,Mohamed   Supervising Physician: Jenna Hacker  Patient Status: [facility] - Out-pt  History of Present Illness: Morgan Torres is a 73 y.o. female with PMH significant for COPD, CKD, HTN, hypothyroidism, metastatic urothelial carcinoma (initially diagnosed as stage II in September 2022 s/p right nephroureterectomy as well as intravesical treatment and resection of superficial tumor in the bladder). She is currently undergoing chemotherapy via port placed by Dr. Karalee with IR on 06/07/22. She has known lung metastases, lest imaged in 7/10 CT. IR consulted for lung biopsy to determine if lung metastases are behaving differently from the primary bladder cancer in order to guide treatment. Her oncologist is Dr. Sherrod Sherrod.  Confirms NPO since MN and ride/supervision available for 24 hours.  Does not wear CPAP or use supplemental home O2 regularly but states she has been asked to use one in the past. Denies sedation related complications in the past.   Denies fever, chills, SOB, CP, sore throat, N/V, abd pain, blood in stool or urine, abnormal bruising, leg swelling.  Allergies Reviewed:  Diclofenac, Padcev  [enfortumab vedotin ], Ciprofloxacin, Codeine, and Hydrocodone   Patient is Full Code  Past Medical History:  Diagnosis Date   Anemia    Anxiety    Arthritis    hip, lumbar spine    Asthma    seasonal allergies    Bronchitis    Hx: of   Chronic kidney disease    COPD (chronic obstructive pulmonary disease) (HCC)    Coronary artery disease    Depression    Family history of adverse reaction to anesthesia    Sister hard to wake up   Fibromyalgia    Hypertension    Hypothyroidism    Lumbar herniated disc    Metastatic urothelial carcinoma (HCC)    Myocardial infarction (HCC) 06/25/2012   followed by Dr. Mona, treated medically, no stents   Peripheral arterial disease (HCC)    of right foot    Pneumonia    Hx: of yrs ago   Sciatica    Sleep apnea    Small bowel obstruction (HCC)    several yrs ago    Past Surgical History:  Procedure Laterality Date   ABDOMINAL SURGERY     resection of small intestine   ANTERIOR CRUCIATE LIGAMENT REPAIR Bilateral    BACK SURGERY     fusion of lumbar   CARDIAC CATHETERIZATION  06/25/2012   COLON SURGERY     resection for bowel - for obstruction  6 to 7 yrs ago per pt on 01-05-2022   COLONOSCOPY     Hx; of   DILATION AND CURETTAGE OF UTERUS     EYE SURGERY Bilateral    cataracts removed   HERNIA REPAIR  02/23/1969   inguinal hernia    IR IMAGING GUIDED PORT INSERTION  06/07/2022   LEFT HEART CATHETERIZATION WITH CORONARY ANGIOGRAM N/A 03/30/2013   Procedure: LEFT HEART CATHETERIZATION WITH CORONARY ANGIOGRAM;  Surgeon: Alm LELON Clay, MD;  Location: [facility] CATH LAB;  Service: Cardiovascular;  Laterality: N/A;   left thumb surgery     yrs ago   ROBOT ASSITED LAPAROSCOPIC NEPHROURETERECTOMY Left 03/09/2021   Procedure: XI ROBOT ASSITED LAPAROSCOPIC NEPHROURETERECTOMY/ CYSTOSCOPY WITH LEFT URETEROSCOPY WITH TRANSURETHRAL RESECTION OF URETERAL ORIFICE;  Surgeon: Devere Lonni Righter, MD;  Location: [facility] ORS;  Service: Urology;  Laterality: Left;   SALIVARY GLAND SURGERY Left    approached from inside mouth & side of neck 1970-1980  TONSILLECTOMY     as child   TOTAL HIP ARTHROPLASTY Left 05/28/2014   Procedure: LEFT TOTAL HIP ARTHROPLASTY;  Surgeon: LELON JONETTA Shari Mickey., MD;  Location: [facility] OR;  Service: Orthopedics;  Laterality: Left;   TRANSURETHRAL RESECTION OF BLADDER TUMOR N/A 08/23/2021   Procedure: TRANSURETHRAL RESECTION OF BLADDER TUMOR (TURBT) WITH CYSTOSCOPY/ POSTOPERATIVE INSTILLATION OF GEMCITABINE /retrograde pyelogram and stent placement (right);  Surgeon: Devere Lonni Righter, MD;  Location: [facility];  Service: Urology;  Laterality: N/A;   TRANSURETHRAL RESECTION OF BLADDER TUMOR N/A 01/10/2022   Procedure:  TRANSURETHRAL RESECTION OF BLADDER TUMOR (TURBT) WITH CYSTOSCOPY / GEMCITABINE  INSTILLATION post-operatively;  Surgeon: Devere Lonni Righter, MD;  Location: [facility];  Service: Urology;  Laterality: N/A;  ONLY NEEDS 30 MIN   TRANSURETHRAL RESECTION OF BLADDER TUMOR N/A 12/25/2023   Procedure: TURBT (TRANSURETHRAL RESECTION OF BLADDER TUMOR);  Surgeon: Devere Lonni Righter, MD;  Location: [facility];  Service: Urology;  Laterality: N/A;  CYSTOSCOPY WITH TURBT (TRANSURETHRAL RESECTION OF BLADDER TUMOR)      Medications: Prior to Admission medications   Medication Sig Start Date End Date Taking? Authorizing Provider  acetaminophen  (TYLENOL ) 500 MG tablet Take 1 tablet (500 mg total) by mouth every 6 (six) hours as needed for mild pain. 10/14/22  Yes Rising, Asberry, PA-C  albuterol  (PROVENTIL  HFA;VENTOLIN  HFA) 108 (90 Base) MCG/ACT inhaler Inhale 2 puffs into the lungs every 6 (six) hours as needed for wheezing or shortness of breath. 05/10/16  Yes Amyot, Jenkins Lesches, NP  LORazepam  (ATIVAN ) 0.5 MG tablet Take 0.5 mg by mouth daily as needed for anxiety. 07/12/23  Yes [provider]  magnesium  oxide (MAG-OX) 400 (240 Mg) MG tablet TAKE 1 TABLET BY MOUTH EVERY DAY 11/27/23  Yes Heilingoetter, Cassandra L, PA-C  montelukast  (SINGULAIR ) 10 MG tablet Take 10 mg by mouth daily.   Yes [provider]  oxybutynin  (DITROPAN ) 5 MG tablet Take 1 tablet (5 mg total) by mouth every 8 (eight) hours as needed for bladder spasms. 12/25/23  Yes Devere Lonni Righter, MD  oxyCODONE  (ROXICODONE ) 5 MG immediate release tablet Take 1 tablet (5 mg total) by mouth every 4 (four) hours as needed for severe pain (pain score 7-10). 12/22/23  Yes Mannie Pac T, DO  oxyCODONE -acetaminophen  (PERCOCET/ROXICET) 5-325 MG tablet Take 1 tablet by mouth every 6 (six) hours as needed for severe pain (pain score 7-10). 12/01/23  Yes Dasie Faden, MD  SYNTHROID  100 MCG tablet TAKE 1 TABLET BY MOUTH  DAILY BEFORE BREAKFAST. 02/03/24  Yes Sherrod Sherrod, MD  traMADol  (ULTRAM ) 50 MG tablet Take 50 mg by mouth every 6 (six) hours as needed (for pain).   Yes [provider]  amLODipine  (NORVASC ) 10 MG tablet Take 10 mg by mouth daily. Patient not taking: Reported on 12/24/2023    [provider]  aspirin  EC 81 MG tablet Take 81 mg by mouth daily. Swallow whole. Patient not taking: Reported on 12/24/2023    [provider]  benzonatate  (TESSALON ) 100 MG capsule Take 100 mg by mouth 3 (three) times daily as needed for cough. 12/18/22   Leigh Lung, MD  calcium  acetate (PHOSLO ) 667 MG tablet Take 667 mg by mouth 3 (three) times daily with meals.    [provider]  cilostazol  (PLETAL ) 50 MG tablet Take 50 mg by mouth 2 (two) times daily.    [provider]  clotrimazole  (LOTRIMIN ) 1 % external solution Apply topically 2 (two) times daily. Patient taking differently: Apply  1 Application topically 2 (two) times daily. 07/20/23   Regalado, Belkys A, MD  cyclobenzaprine  (FLEXERIL ) 10 MG tablet Take 1 tablet (10 mg total) by mouth 2 (two) times daily as needed for muscle spasms. 12/22/23   Mannie Fairy DASEN, DO  EPINEPHrine  (EPIPEN  2-PAK) 0.3 mg/0.3 mL DEVI Inject 0.3 mLs (0.3 mg total) into the muscle once as needed (for severe allergic reaction). CAll 911 immediately if you have to use this medicine Patient taking differently: Inject 0.3 mg into the muscle once as needed (for a severe allergic reaction- CALL 9-1-1 IMMEDIATELY IF USED). 11/26/12   Piepenbrink, Delon, PA-C  gabapentin  (NEURONTIN ) 300 MG capsule Take 300 mg by mouth at bedtime. 09/24/23   [provider]  guaiFENesin  (MUCINEX ) 600 MG 12 hr tablet Take 2 tablets (1,200 mg total) by mouth 2 (two) times daily. Patient taking differently: Take 1,200 mg by mouth 2 (two) times daily as needed for to loosen phlegm. 07/20/23   Regalado, Belkys A, MD  ipratropium (ATROVENT ) 0.06 % nasal spray Place 2  sprays into the nose daily as needed for rhinitis. 09/24/23   [provider]  lidocaine -prilocaine  (EMLA ) cream Apply 1 Application topically as needed. Patient taking differently: Apply 1 Application topically as needed (port). 10/30/23   Heilingoetter, Cassandra L, PA-C  methocarbamol  (ROBAXIN ) 500 MG tablet Take 1 tablet (500 mg total) by mouth 2 (two) times daily. 12/01/23   Dasie Faden, MD  metoprolol  tartrate (LOPRESSOR ) 25 MG tablet Take 1 tablet by mouth 2 (two) times daily. Patient taking differently: Take 1 tablet by mouth as needed. 09/24/23   [provider]  MILK OF MAGNESIA 400 MG/5ML suspension Take 15-30 mLs by mouth daily as needed for mild constipation.    [provider]  nicotine  (NICODERM CQ  - DOSED IN MG/24 HOURS) 21 mg/24hr patch Place 21 mg onto the skin daily.    [provider]  nicotine  polacrilex (NICORETTE ) 4 MG gum Take 4 mg by mouth as needed for smoking cessation.    [provider]  nitroGLYCERIN  (NITROSTAT ) 0.4 MG SL tablet Place 1 tablet (0.4 mg total) under the tongue every 5 (five) minutes x 3 doses as needed for chest pain. 09/25/18   Hilty, Vinie BROCKS, MD  phenazopyridine  (PYRIDIUM ) 200 MG tablet Take 1 tablet (200 mg total) by mouth 3 (three) times daily as needed (for pain with urination). 12/25/23 12/24/24  Devere Lonni Righter, MD  Potassium & Sodium Phosphates (PHOSPHORUS  W/SOD & POTASSIUM) 280-160-250 MG PACK TAKE 1 PACKAGE BY MOUTH 2 (TWO) TIMES DAILY. 12/04/23   Sherrod Sherrod, MD  prochlorperazine  (COMPAZINE ) 10 MG tablet Take 1 tablet (10 mg total) by mouth every 6 (six) hours as needed. 11/06/23   Heilingoetter, Cassandra L, PA-C  sodium chloride  1 g tablet TAKE 1 TABLET (1 G TOTAL) BY MOUTH DAILY. SPLIT 1 GRAM INTO TWO HALVES AND TAKE BY MOUTH ONCE A DAY Patient taking differently: Take 0.5 g by mouth in the morning and at bedtime. 12/02/23   Heilingoetter, Cassandra L, PA-C  temazepam  (RESTORIL ) 15 MG capsule Take  1 capsule (15 mg total) by mouth at bedtime as needed for sleep. 02/06/23   Heilingoetter, Cassandra L, PA-C     Family History  Problem Relation Age of Onset   Hypertension Mother    Diabetes Mother    Cancer - Other Mother    Cancer Sister    Breast cancer Daughter     Social History   Socioeconomic History   Marital status:  Married    Spouse name: Not on file   Number of children: Not on file   Years of education: Not on file   Highest education level: Not on file  Occupational History   Not on file  Tobacco Use   Smoking status: Former    Current packs/day: 0.50    Average packs/day: 0.5 packs/day for 25.0 years (12.5 ttl pk-yrs)    Types: Cigarettes   Smokeless tobacco: Never   Tobacco comments:    Currently on the Nicoderm patch.smokes a few  Vaping Use   Vaping status: Never Used  Substance and Sexual Activity   Alcohol  use: Not Currently   Drug use: No   Sexual activity: Not Currently  Other Topics Concern   Not on file  Social History Narrative   Not on file   Social Drivers of Health   Financial Resource Strain: Not on file  Food Insecurity: Low Risk  (09/12/2023)   Received from [facility]   Hunger Vital Sign    Within the past 12 months, you worried that your food would run out before you got money to buy more: Never true    Within the past 12 months, the food you bought just didn't last and you didn't have money to get more. : Never true  Transportation Needs: No Transportation Needs (09/24/2023)   Received from [facility]   PRAPARE - Transportation    Lack of Transportation (Medical): No    Lack of Transportation (Non-Medical): No  Physical Activity: Not on file  Stress: Not on file  Social Connections: Socially Integrated (09/02/2023)   Social Connection and Isolation Panel    Frequency of Communication with Friends and Family: Three times a week    Frequency of Social Gatherings with Friends and Family: Once a week    Attends Religious  Services: More than 4 times per year    Active Member of [employer] or Organizations: Yes    Attends Engineer, structural: More than 4 times per year    Marital Status: Married     Review of Systems: A 12 point ROS discussed and pertinent positives are indicated in the HPI above.  All other systems are negative.    Vital Signs: BP 135/66 (BP Location: Left Arm)   Pulse 69   Temp 97.7 F (36.5 C) (Oral)   Resp 14   SpO2 98%   Advance Care Plan: No documents on file    Physical Exam HENT:     Mouth/Throat:     Mouth: Mucous membranes are moist.     Pharynx: Oropharynx is clear.  Cardiovascular:     Rate and Rhythm: Normal rate.     Pulses: Normal pulses.     Heart sounds: Normal heart sounds.  Pulmonary:     Effort: Pulmonary effort is normal.     Breath sounds: Normal breath sounds.  Abdominal:     General: There is no distension.     Palpations: Abdomen is soft.     Tenderness: There is no abdominal tenderness.  Musculoskeletal:     Right lower leg: No edema.     Left lower leg: No edema.  Skin:    General: Skin is warm and dry.     Findings: No lesion or rash.     Comments: R chest port accessed  Neurological:     Mental Status: She is alert and oriented to person, place, and time.  Psychiatric:  Mood and Affect: Mood normal.        Behavior: Behavior normal.        Thought Content: Thought content normal.        Judgment: Judgment normal.     Imaging: No results found.  Labs:  CBC: Recent Labs    01/07/24 0851 02/11/24 1025 02/18/24 1403 02/20/24 1042  WBC 5.0 6.3 7.0 7.1  HGB 11.3* 11.2* 12.3 11.7*  HCT 34.1* 34.2* 37.9 36.6  PLT 322 284 310 282    COAGS: Recent Labs    09/03/23 0418 02/20/24 1042  INR 1.0 1.0  APTT 24  --     BMP: Recent Labs    12/22/23 0957 01/07/24 0851 02/11/24 1025 02/18/24 1403  NA 133* 139 137 136  K 4.4 4.3 4.1 4.8  CL 100 104 103 104  CO2 25 27 28 27   GLUCOSE 141* 105* 91 101*  BUN  25* 21 18 19   CALCIUM  8.8* 9.3 8.5* 9.4  CREATININE 1.30* 1.30* 1.12* 1.16*  GFRNONAA 43* 43* 52* 49*    LIVER FUNCTION TESTS: Recent Labs    12/17/23 1009 01/07/24 0851 02/11/24 1025 02/18/24 1403  BILITOT 0.2 0.3 0.2 0.3  AST 12* 19 12* 13*  ALT 11 13 9 10   ALKPHOS 78 83 82 93  PROT 6.4* 6.3* 6.2* 6.7  ALBUMIN  3.6 3.6 3.7 3.8    TUMOR MARKERS: No results for input(s): AFPTM, CEA, CA199, CHROMGRNA in the last 8760 hours.  Assessment and Plan:  Request for  image guided right lung nodule biopsy approved for 8/28 No contraindications for procedure identified in ROS, physical exam, or review of pre-sedation considerations. INR and CBC drawn same day as procedure. Still pending at time of note but will be reviewed prior to procedure start.  7/10 CT chest imaging available and reviewed VSS, afebrile ASA held since last Saturday. Abx not indicated    Risks and benefits of lung nodule biopsy was discussed with the patient and/or patient's family including, but not limited to bleeding, infection, damage to adjacent structures or low yield requiring additional tests. I also discussed the risk of pneumothorax requiring chest tube and subsequent hospitalization.   All of the questions were answered and there is agreement to proceed.  Consent signed and in chart.   Thank you for allowing our service to participate in Morgan Torres 's care.    Electronically Signed: Laymon Coast, NP   02/20/2024, 11:28 AM     I spent a total of  15 Minutes   in face to face in clinical consultation, greater than 50% of which was counseling/coordinating care for image guided lung nodule biopsy.   (A copy of this note was sent to the referring provider and the time of visit.)

## 2024-02-26 ENCOUNTER — Telehealth: Payer: Self-pay | Admitting: Medical Oncology

## 2024-02-26 NOTE — Telephone Encounter (Signed)
 Confirmed appt.

## 2024-03-01 ENCOUNTER — Other Ambulatory Visit: Payer: Self-pay | Admitting: Internal Medicine

## 2024-03-01 DIAGNOSIS — C7910 Secondary malignant neoplasm of unspecified urinary organs: Secondary | ICD-10-CM

## 2024-03-01 DIAGNOSIS — C7800 Secondary malignant neoplasm of unspecified lung: Secondary | ICD-10-CM

## 2024-03-02 LAB — SURGICAL PATHOLOGY

## 2024-03-10 ENCOUNTER — Ambulatory Visit

## 2024-03-10 ENCOUNTER — Encounter (HOSPITAL_COMMUNITY)

## 2024-03-11 ENCOUNTER — Inpatient Hospital Stay

## 2024-03-11 ENCOUNTER — Inpatient Hospital Stay: Attending: Internal Medicine

## 2024-03-11 ENCOUNTER — Inpatient Hospital Stay: Attending: Internal Medicine | Admitting: Internal Medicine

## 2024-03-11 VITALS — BP 122/62 | Temp 98.0°F | Resp 17 | Ht 68.0 in | Wt 164.4 lb

## 2024-03-11 DIAGNOSIS — Z9089 Acquired absence of other organs: Secondary | ICD-10-CM | POA: Insufficient documentation

## 2024-03-11 DIAGNOSIS — Z7982 Long term (current) use of aspirin: Secondary | ICD-10-CM | POA: Diagnosis not present

## 2024-03-11 DIAGNOSIS — C679 Malignant neoplasm of bladder, unspecified: Secondary | ICD-10-CM | POA: Diagnosis not present

## 2024-03-11 DIAGNOSIS — Z9226 Personal history of immune checkpoint inhibitor therapy: Secondary | ICD-10-CM | POA: Insufficient documentation

## 2024-03-11 DIAGNOSIS — I252 Old myocardial infarction: Secondary | ICD-10-CM | POA: Diagnosis not present

## 2024-03-11 DIAGNOSIS — C680 Malignant neoplasm of urethra: Secondary | ICD-10-CM | POA: Insufficient documentation

## 2024-03-11 DIAGNOSIS — J939 Pneumothorax, unspecified: Secondary | ICD-10-CM | POA: Insufficient documentation

## 2024-03-11 DIAGNOSIS — D759 Disease of blood and blood-forming organs, unspecified: Secondary | ICD-10-CM | POA: Insufficient documentation

## 2024-03-11 DIAGNOSIS — R0602 Shortness of breath: Secondary | ICD-10-CM | POA: Insufficient documentation

## 2024-03-11 DIAGNOSIS — L512 Toxic epidermal necrolysis [Lyell]: Secondary | ICD-10-CM | POA: Insufficient documentation

## 2024-03-11 DIAGNOSIS — I129 Hypertensive chronic kidney disease with stage 1 through stage 4 chronic kidney disease, or unspecified chronic kidney disease: Secondary | ICD-10-CM | POA: Insufficient documentation

## 2024-03-11 DIAGNOSIS — M797 Fibromyalgia: Secondary | ICD-10-CM | POA: Insufficient documentation

## 2024-03-11 DIAGNOSIS — R21 Rash and other nonspecific skin eruption: Secondary | ICD-10-CM | POA: Insufficient documentation

## 2024-03-11 DIAGNOSIS — N1831 Chronic kidney disease, stage 3a: Secondary | ICD-10-CM | POA: Insufficient documentation

## 2024-03-11 DIAGNOSIS — Z79620 Long term (current) use of immunosuppressive biologic: Secondary | ICD-10-CM | POA: Diagnosis not present

## 2024-03-11 DIAGNOSIS — C7910 Secondary malignant neoplasm of unspecified urinary organs: Secondary | ICD-10-CM

## 2024-03-11 DIAGNOSIS — C7800 Secondary malignant neoplasm of unspecified lung: Secondary | ICD-10-CM

## 2024-03-11 DIAGNOSIS — M1990 Unspecified osteoarthritis, unspecified site: Secondary | ICD-10-CM | POA: Diagnosis not present

## 2024-03-11 DIAGNOSIS — Z885 Allergy status to narcotic agent status: Secondary | ICD-10-CM | POA: Diagnosis not present

## 2024-03-11 DIAGNOSIS — Z8701 Personal history of pneumonia (recurrent): Secondary | ICD-10-CM | POA: Insufficient documentation

## 2024-03-11 DIAGNOSIS — Z881 Allergy status to other antibiotic agents status: Secondary | ICD-10-CM | POA: Insufficient documentation

## 2024-03-11 DIAGNOSIS — Z5112 Encounter for antineoplastic immunotherapy: Secondary | ICD-10-CM | POA: Diagnosis not present

## 2024-03-11 DIAGNOSIS — E039 Hypothyroidism, unspecified: Secondary | ICD-10-CM | POA: Diagnosis not present

## 2024-03-11 DIAGNOSIS — L511 Stevens-Johnson syndrome: Secondary | ICD-10-CM | POA: Insufficient documentation

## 2024-03-11 DIAGNOSIS — Z79899 Other long term (current) drug therapy: Secondary | ICD-10-CM | POA: Diagnosis not present

## 2024-03-11 LAB — CMP (CANCER CENTER ONLY)
ALT: 13 U/L (ref 0–44)
AST: 15 U/L (ref 15–41)
Albumin: 3.8 g/dL (ref 3.5–5.0)
Alkaline Phosphatase: 107 U/L (ref 38–126)
Anion gap: 6 (ref 5–15)
BUN: 21 mg/dL (ref 8–23)
CO2: 28 mmol/L (ref 22–32)
Calcium: 9.1 mg/dL (ref 8.9–10.3)
Chloride: 101 mmol/L (ref 98–111)
Creatinine: 1.37 mg/dL — ABNORMAL HIGH (ref 0.44–1.00)
GFR, Estimated: 41 mL/min — ABNORMAL LOW (ref >60)
Glucose, Bld: 96 mg/dL (ref 70–99)
Potassium: 4.7 mmol/L (ref 3.5–5.1)
Sodium: 135 mmol/L (ref 135–145)
Total Bilirubin: 0.3 mg/dL (ref 0.0–1.2)
Total Protein: 7 g/dL (ref 6.5–8.1)

## 2024-03-11 LAB — CBC WITH DIFFERENTIAL (CANCER CENTER ONLY)
Abs Immature Granulocytes: 0.04 K/uL (ref 0.00–0.07)
Basophils Absolute: 0.1 K/uL (ref 0.0–0.1)
Basophils Relative: 1 %
Eosinophils Absolute: 0.1 K/uL (ref 0.0–0.5)
Eosinophils Relative: 1 %
HCT: 38.9 % (ref 36.0–46.0)
Hemoglobin: 12.6 g/dL (ref 12.0–15.0)
Immature Granulocytes: 1 %
Lymphocytes Relative: 36 %
Lymphs Abs: 2.6 K/uL (ref 0.7–4.0)
MCH: 31 pg (ref 26.0–34.0)
MCHC: 32.4 g/dL (ref 30.0–36.0)
MCV: 95.6 fL (ref 80.0–100.0)
Monocytes Absolute: 0.8 K/uL (ref 0.1–1.0)
Monocytes Relative: 11 %
Neutro Abs: 3.6 K/uL (ref 1.7–7.7)
Neutrophils Relative %: 50 %
Platelet Count: 289 K/uL (ref 150–400)
RBC: 4.07 MIL/uL (ref 3.87–5.11)
RDW: 14.7 % (ref 11.5–15.5)
WBC Count: 7.2 K/uL (ref 4.0–10.5)
nRBC: 0 % (ref 0.0–0.2)

## 2024-03-11 MED ORDER — SODIUM CHLORIDE 0.9 % IV SOLN: INTRAVENOUS | Status: DC

## 2024-03-11 MED ORDER — SODIUM CHLORIDE 0.9 % IV SOLN
200.0000 mg | Freq: Once | INTRAVENOUS | Status: AC
  Administered 2024-03-11: 200 mg via INTRAVENOUS
  Filled 2024-03-11: qty 200

## 2024-03-11 NOTE — Patient Instructions (Signed)
 [facility] - A DEPT OF [facility]  Discharge Instructions: Thank you for choosing [facility] to provide your oncology and hematology care.   If you have a lab appointment with the Cancer Center, please go directly to the Cancer Center and check in at the registration area.   Wear comfortable clothing and clothing appropriate for easy access to any Portacath or PICC line.   We strive to give you quality time with your provider. You may need to reschedule your appointment if you arrive late (15 or more minutes).  Arriving late affects you and other patients whose appointments are after yours.  Also, if you miss three or more appointments without notifying the office, you may be dismissed from the clinic at the provider's discretion.      For prescription refill requests, have your pharmacy contact our office and allow 72 hours for refills to be completed.    Today you received the following chemotherapy and/or immunotherapy agents Keytruda      To help prevent nausea and vomiting after your treatment, we encourage you to take your nausea medication as directed.  BELOW ARE SYMPTOMS THAT SHOULD BE REPORTED IMMEDIATELY: *FEVER GREATER THAN 100.4 F (38 C) OR HIGHER *CHILLS OR SWEATING *NAUSEA AND VOMITING THAT IS NOT CONTROLLED WITH YOUR NAUSEA MEDICATION *UNUSUAL SHORTNESS OF BREATH *UNUSUAL BRUISING OR BLEEDING *URINARY PROBLEMS (pain or burning when urinating, or frequent urination) *BOWEL PROBLEMS (unusual diarrhea, constipation, pain near the anus) TENDERNESS IN MOUTH AND THROAT WITH OR WITHOUT PRESENCE OF ULCERS (sore throat, sores in mouth, or a toothache) UNUSUAL RASH, SWELLING OR PAIN  UNUSUAL VAGINAL DISCHARGE OR ITCHING   Items with * indicate a potential emergency and should be followed up as soon as possible or go to the Emergency Department if any problems should occur.  Please show the CHEMOTHERAPY ALERT CARD or IMMUNOTHERAPY  ALERT CARD at check-in to the Emergency Department and triage nurse.  Should you have questions after your visit or need to cancel or reschedule your appointment, please contact [facility] - A DEPT OF [facility]  Dept: [phone]  and follow the prompts.  Office hours are 8:00 a.m. to 4:30 p.m. Monday - Friday. Please note that voicemails left after 4:00 p.m. may not be returned until the following business day.  We are closed weekends and major holidays. You have access to a nurse at all times for urgent questions. Please call the main number to the clinic Dept: [phone] and follow the prompts.   For any non-urgent questions, you may also contact your provider using MyChart. We now offer e-Visits for anyone 88 and older to request care online for non-urgent symptoms. For details visit [email or web address].   Also download the MyChart app! Go to the app store, search "MyChart", open the app, select [place], and log in with your MyChart username and password.   Pembrolizumab Injection What is this medication? PEMBROLIZUMAB (PEM broe LIZ ue mab) treats some types of cancer. It works by helping your immune system slow or stop the spread of cancer cells. It is a monoclonal antibody. This medicine may be used for other purposes; ask your health care provider or pharmacist if you have questions. COMMON BRAND NAME(S): Keytruda What should I tell my care team before I take this medication? They need to know if you have any of these conditions: Allogeneic stem cell transplant (uses someone else's stem  cells) Autoimmune diseases, such as Crohn disease, ulcerative colitis, lupus History of chest radiation Nervous system problems, such as Guillain-Barre syndrome, myasthenia gravis Organ transplant An unusual or allergic reaction to pembrolizumab, other medications, foods, dyes, or preservatives Pregnant or trying to get pregnant Breast-feeding How should I  use this medication? This medication is injected into a vein. It is given by your care team in a hospital or clinic setting. A special MedGuide will be given to you before each treatment. Be sure to read this information carefully each time. Talk to your care team about the use of this medication in children. While it may be prescribed for children as young as 6 months for selected conditions, precautions do apply. Overdosage: If you think you have taken too much of this medicine contact a poison control center or emergency room at once. NOTE: This medicine is only for you. Do not share this medicine with others. What if I miss a dose? Keep appointments for follow-up doses. It is important not to miss your dose. Call your care team if you are unable to keep an appointment. What may interact with this medication? Interactions have not been studied. This list may not describe all possible interactions. Give your health care provider a list of all the medicines, herbs, non-prescription drugs, or dietary supplements you use. Also tell them if you smoke, drink alcohol, or use illegal drugs. Some items may interact with your medicine. What should I watch for while using this medication? Your condition will be monitored carefully while you are receiving this medication. You may need blood work while taking this medication. This medication may cause serious skin reactions. They can happen weeks to months after starting the medication. Contact your care team right away if you notice fevers or flu-like symptoms with a rash. The rash may be red or purple and then turn into blisters or peeling of the skin. You may also notice a red rash with swelling of the face, lips, or lymph nodes in your neck or under your arms. Tell your care team right away if you have any change in your eyesight. Talk to your care team if you may be pregnant. Serious birth defects can occur if you take this medication during pregnancy and  for 4 months after the last dose. You will need a negative pregnancy test before starting this medication. Contraception is recommended while taking this medication and for 4 months after the last dose. Your care team can help you find the option that works for you. Do not breastfeed while taking this medication and for 4 months after the last dose. What side effects may I notice from receiving this medication? Side effects that you should report to your care team as soon as possible: Allergic reactions--skin rash, itching, hives, swelling of the face, lips, tongue, or throat Dry cough, shortness of breath or trouble breathing Eye pain, redness, irritation, or discharge with blurry or decreased vision Heart muscle inflammation--unusual weakness or fatigue, shortness of breath, chest pain, fast or irregular heartbeat, dizziness, swelling of the ankles, feet, or hands Hormone gland problems--headache, sensitivity to light, unusual weakness or fatigue, dizziness, fast or irregular heartbeat, increased sensitivity to cold or heat, excessive sweating, constipation, hair loss, increased thirst or amount of urine, tremors or shaking, irritability Infusion reactions--chest pain, shortness of breath or trouble breathing, feeling faint or lightheaded Kidney injury (glomerulonephritis)--decrease in the amount of urine, red or dark brown urine, foamy or bubbly urine, swelling of the ankles, hands, or  feet Liver injury--right upper belly pain, loss of appetite, nausea, light-colored stool, dark yellow or brown urine, yellowing skin or eyes, unusual weakness or fatigue Pain, tingling, or numbness in the hands or feet, muscle weakness, change in vision, confusion or trouble speaking, loss of balance or coordination, trouble walking, seizures Rash, fever, and swollen lymph nodes Redness, blistering, peeling, or loosening of the skin, including inside the mouth Sudden or severe stomach pain, bloody diarrhea, fever,  nausea, vomiting Side effects that usually do not require medical attention (report to your care team if they continue or are bothersome): Bone, joint, or muscle pain Diarrhea Fatigue Loss of appetite Nausea Skin rash This list may not describe all possible side effects. Call your doctor for medical advice about side effects. You may report side effects to FDA at 1-800-FDA-1088. Where should I keep my medication? This medication is given in a hospital or clinic. It will not be stored at home. NOTE: This sheet is a summary. It may not cover all possible information. If you have questions about this medicine, talk to your doctor, pharmacist, or health care provider.  2024 [employer]vier/Gold Standard (2021-10-24 00:00:00)

## 2024-03-11 NOTE — Progress Notes (Signed)
 [facility] [phone]   [phone]  OFFICE PROGRESS NOTE  Morgan Lung, MD [address] [place] [address]  DIAGNOSIS: Metastatic urothelial carcinoma that was initially diagnosed as stage II (T2, N0, M0) in September 2022 status post right nephroureterectomy as well as intravesical treatment and resection of superficial tumor in the bladder several times and March 2023 as well as July 2023.  The patient was found to have enlarging and new pulmonary nodules consistent with metastatic disease in November 2023.    PRIOR THERAPY: 1) Right nephroureterectomy as well as intravesical treatment and resection of superficial tumor in the bladder several times and March 2023 as well as July 2023.  2) Palliative systemic chemotherapy with carboplatin  for AUC of 5 on day 1 and gemcitabine  1000 mg/M2 on days 1 and 8 every 3 weeks. First cycle June 21, 2022. Status post 6 cycles. Her dose of carboplatin  was reduced to AUC of 4 and gemcitabine  to 800 mg/m2 starting from cycle #5 due to cytopenias. 3) Maintenance immunotherapy with Avelumab  800 mg IV every 2 weeks. First dose on 11/15/22. Status post 6 cycle of treatment. 4) Erdafitinib  (balversa ) 8 mg p.o daily. First dose 02/22/23. Status post 4 months of treatment. Discontinued in February 2025 due to disease progression.  5) Enfortumab vedotin  infusion therapy: once a week for three weeks, followed by one week off, repeated every four weeks. First dose was on 08/08/2023.  Status post 1 cycle.  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 6) sacituzumab govitecan  (Trodelvy ) 10 mg/kg on days 1 and 8 every 3 weeks. First dose 11/05/2023.  Status post 3 cycles discontinued secondary to disease progression.   CURRENT THERAPY: Keytruda  200 Mg IV every 3 weeks.  First dose 02/18/2024.  Status post 1  cycle.Discussed the use of AI scribe software for clinical note transcription with the patient, who gave verbal consent to proceed.  History of Present Illness Morgan Torres is a 73 year old female with metastatic urothelial carcinoma who presents for evaluation before her immunotherapy treatment. She is accompanied by her sister, Morgan Torres.  She has a history of metastatic urothelial carcinoma and has undergone multiple treatments. Initially, she received systemic chemotherapy with carboplatin  and gemcitabine , followed by maintenance treatment with avelumab . These treatments were discontinued due to disease progression. She was also treated with erdafitinib  orally, which was discontinued for the same reason.  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 She was then treated with sacituzumab govitecan , which was also discontinued due to disease progression.  Currently, she is on immunotherapy with Keytruda , receiving 200 mg IV every three weeks, and has completed one cycle. She describes the first round of Keytruda  as 'okay'.  A recent biopsy of a pulmonary nodule in the left lower lobe was performed, and the patient was informed that it was consistent with metastatic moderately differentiated urothelial carcinoma. HER2 testing was negative.  She experiences occasional shortness of breath, particularly at night, but has no chest pain.     INTERVAL HISTORY: Morgan Torres 73 y.o. female returns to the clinic today for follow-up visit accompanied by her husband.   MEDICAL HISTORY: Past Medical History:  Diagnosis Date   Anemia    Anxiety    Arthritis    hip,  lumbar spine    Asthma    seasonal allergies    Bronchitis    Hx: of   Chronic kidney disease    COPD (chronic obstructive pulmonary disease) (HCC)    Coronary artery disease    Depression    Family history of  adverse reaction to anesthesia    Sister hard to wake up   Fibromyalgia    Hypertension    Hypothyroidism    Lumbar herniated disc    Metastatic urothelial carcinoma (HCC)    Myocardial infarction (HCC) 06/25/2012   followed by Dr. Mona, treated medically, no stents   Peripheral arterial disease (HCC)    of right foot   Pneumonia    Hx: of yrs ago   Sciatica    Sleep apnea    Small bowel obstruction (HCC)    several yrs ago    ALLERGIES:  is allergic to diclofenac, padcev  [enfortumab vedotin ], ciprofloxacin, codeine, and hydrocodone.  MEDICATIONS:  Current Outpatient Medications  Medication Sig Dispense Refill   acetaminophen  (TYLENOL ) 500 MG tablet Take 1 tablet (500 mg total) by mouth every 6 (six) hours as needed for mild pain. 30 tablet 0   albuterol  (PROVENTIL  HFA;VENTOLIN  HFA) 108 (90 Base) MCG/ACT inhaler Inhale 2 puffs into the lungs every 6 (six) hours as needed for wheezing or shortness of breath. 1 Inhaler 0   amLODipine  (NORVASC ) 10 MG tablet Take 10 mg by mouth daily.     aspirin  EC 81 MG tablet Take 81 mg by mouth daily. Swallow whole.     benzonatate  (TESSALON ) 100 MG capsule Take 100 mg by mouth 3 (three) times daily as needed for cough.     calcium  acetate (PHOSLO ) 667 MG tablet Take 667 mg by mouth 3 (three) times daily with meals.     cilostazol  (PLETAL ) 50 MG tablet Take 50 mg by mouth 2 (two) times daily.     clotrimazole  (LOTRIMIN ) 1 % external solution Apply topically 2 (two) times daily. (Patient taking differently: Apply 1 Application topically 2 (two) times daily.) 30 mL 0   cyclobenzaprine  (FLEXERIL ) 10 MG tablet Take 1 tablet (10 mg total) by mouth 2 (two) times daily as needed for muscle spasms. 20 tablet 0   EPINEPHrine  (EPIPEN  2-PAK) 0.3 mg/0.3 mL DEVI Inject 0.3 mLs (0.3 mg total) into the muscle once as needed (for severe allergic reaction). CAll 911 immediately if you have to use this medicine (Patient taking differently: Inject 0.3 mg into the  muscle once as needed (for a severe allergic reaction- CALL 9-1-1 IMMEDIATELY IF USED).) 1 Device 1   gabapentin  (NEURONTIN ) 300 MG capsule Take 300 mg by mouth at bedtime.     guaiFENesin  (MUCINEX ) 600 MG 12 hr tablet Take 2 tablets (1,200 mg total) by mouth 2 (two) times daily. (Patient taking differently: Take 1,200 mg by mouth 2 (two) times daily as needed for to loosen phlegm.) 120 tablet 0   ipratropium (ATROVENT ) 0.06 % nasal spray Place 2 sprays into the nose daily as needed for rhinitis.     lidocaine -prilocaine  (EMLA ) cream Apply 1 Application topically as needed. (Patient taking differently: Apply 1 Application topically as needed (port).) 30 g 2   loperamide  (IMODIUM ) 2 MG capsule TAKE 2 TABS BY MOUTH WITH FIRST LOOSE STOOL, THEN 1 TAB WITH EACH ADDITIONAL LOOSE STOOL AS NEEDED. DO NOT EXCEED 8 TABS IN A 24-HOUR PERIOD 60 capsule 3   LORazepam  (ATIVAN ) 0.5 MG tablet Take 0.5 mg by mouth daily as needed for anxiety.  magnesium  oxide (MAG-OX) 400 (240 Mg) MG tablet TAKE 1 TABLET BY MOUTH EVERY DAY 90 tablet 1   methocarbamol  (ROBAXIN ) 500 MG tablet Take 1 tablet (500 mg total) by mouth 2 (two) times daily. 20 tablet 0   metoprolol  tartrate (LOPRESSOR ) 25 MG tablet Take 1 tablet by mouth 2 (two) times daily. (Patient taking differently: Take 1 tablet by mouth as needed.)     MILK OF MAGNESIA 400 MG/5ML suspension Take 15-30 mLs by mouth daily as needed for mild constipation.     montelukast  (SINGULAIR ) 10 MG tablet Take 10 mg by mouth daily.     nicotine  (NICODERM CQ  - DOSED IN MG/24 HOURS) 21 mg/24hr patch Place 21 mg onto the skin daily.     nicotine  polacrilex (NICORETTE ) 4 MG gum Take 4 mg by mouth as needed for smoking cessation.     nitroGLYCERIN  (NITROSTAT ) 0.4 MG SL tablet Place 1 tablet (0.4 mg total) under the tongue every 5 (five) minutes x 3 doses as needed for chest pain. 25 tablet 2   oxybutynin  (DITROPAN ) 5 MG tablet Take 1 tablet (5 mg total) by mouth every 8 (eight) hours  as needed for bladder spasms. 30 tablet 1   oxyCODONE  (ROXICODONE ) 5 MG immediate release tablet Take 1 tablet (5 mg total) by mouth every 4 (four) hours as needed for severe pain (pain score 7-10). 15 tablet 0   oxyCODONE -acetaminophen  (PERCOCET/ROXICET) 5-325 MG tablet Take 1 tablet by mouth every 6 (six) hours as needed for severe pain (pain score 7-10). 15 tablet 0   phenazopyridine  (PYRIDIUM ) 200 MG tablet Take 1 tablet (200 mg total) by mouth 3 (three) times daily as needed (for pain with urination). 30 tablet 0   Potassium & Sodium Phosphates (PHOSPHORUS  W/SOD & POTASSIUM) 280-160-250 MG PACK TAKE 1 PACKAGE BY MOUTH 2 (TWO) TIMES DAILY. 30 each 1   prochlorperazine  (COMPAZINE ) 10 MG tablet Take 1 tablet (10 mg total) by mouth every 6 (six) hours as needed. 30 tablet 2   sodium chloride  1 g tablet TAKE 1 TABLET (1 G TOTAL) BY MOUTH DAILY. SPLIT 1 GRAM INTO TWO HALVES AND TAKE BY MOUTH ONCE A DAY (Patient taking differently: Take 0.5 g by mouth in the morning and at bedtime.) 30 tablet 0   SYNTHROID  100 MCG tablet TAKE 1 TABLET BY MOUTH DAILY BEFORE BREAKFAST. 90 tablet 1   temazepam  (RESTORIL ) 15 MG capsule Take 1 capsule (15 mg total) by mouth at bedtime as needed for sleep. 30 capsule 0   traMADol  (ULTRAM ) 50 MG tablet Take 50 mg by mouth every 6 (six) hours as needed (for pain).     No current facility-administered medications for this visit.    SURGICAL HISTORY:  Past Surgical History:  Procedure Laterality Date   ABDOMINAL SURGERY     resection of small intestine   ANTERIOR CRUCIATE LIGAMENT REPAIR Bilateral    BACK SURGERY     fusion of lumbar   CARDIAC CATHETERIZATION  06/25/2012   COLON SURGERY     resection for bowel - for obstruction  6 to 7 yrs ago per pt on 01-05-2022   COLONOSCOPY     Hx; of   DILATION AND CURETTAGE OF UTERUS     EYE SURGERY Bilateral    cataracts removed   HERNIA REPAIR  02/23/1969   inguinal hernia    IR IMAGING GUIDED PORT INSERTION  06/07/2022    LEFT HEART CATHETERIZATION WITH CORONARY ANGIOGRAM N/A 03/30/2013   Procedure: LEFT HEART CATHETERIZATION  WITH CORONARY ANGIOGRAM;  Surgeon: Alm LELON Clay, MD;  Location: [facility] CATH LAB;  Service: Cardiovascular;  Laterality: N/A;   left thumb surgery     yrs ago   ROBOT ASSITED LAPAROSCOPIC NEPHROURETERECTOMY Left 03/09/2021   Procedure: XI ROBOT ASSITED LAPAROSCOPIC NEPHROURETERECTOMY/ CYSTOSCOPY WITH LEFT URETEROSCOPY WITH TRANSURETHRAL RESECTION OF URETERAL ORIFICE;  Surgeon: Devere Lonni Righter, MD;  Location: [facility] ORS;  Service: Urology;  Laterality: Left;   SALIVARY GLAND SURGERY Left    approached from inside mouth & side of neck 1970-1980   TONSILLECTOMY     as child   TOTAL HIP ARTHROPLASTY Left 05/28/2014   Procedure: LEFT TOTAL HIP ARTHROPLASTY;  Surgeon: LELON JONETTA Shari Mickey., MD;  Location: [facility] OR;  Service: Orthopedics;  Laterality: Left;   TRANSURETHRAL RESECTION OF BLADDER TUMOR N/A 08/23/2021   Procedure: TRANSURETHRAL RESECTION OF BLADDER TUMOR (TURBT) WITH CYSTOSCOPY/ POSTOPERATIVE INSTILLATION OF GEMCITABINE /retrograde pyelogram and stent placement (right);  Surgeon: Devere Lonni Righter, MD;  Location: [facility];  Service: Urology;  Laterality: N/A;   TRANSURETHRAL RESECTION OF BLADDER TUMOR N/A 01/10/2022   Procedure: TRANSURETHRAL RESECTION OF BLADDER TUMOR (TURBT) WITH CYSTOSCOPY / GEMCITABINE  INSTILLATION post-operatively;  Surgeon: Devere Lonni Righter, MD;  Location: [facility];  Service: Urology;  Laterality: N/A;  ONLY NEEDS 30 MIN   TRANSURETHRAL RESECTION OF BLADDER TUMOR N/A 12/25/2023   Procedure: TURBT (TRANSURETHRAL RESECTION OF BLADDER TUMOR);  Surgeon: Devere Lonni Righter, MD;  Location: [facility];  Service: Urology;  Laterality: N/A;  CYSTOSCOPY WITH TURBT (TRANSURETHRAL RESECTION OF BLADDER TUMOR)    REVIEW OF SYSTEMS:  Constitutional: positive for fatigue Eyes: negative Ears, nose, mouth, throat, and face:  negative Respiratory: positive for dyspnea on exertion Cardiovascular: negative Gastrointestinal: negative Genitourinary:negative Integument/breast: negative Hematologic/lymphatic: negative Musculoskeletal:negative Neurological: negative Behavioral/Psych: negative Endocrine: negative Allergic/Immunologic: negative   PHYSICAL EXAMINATION: General appearance: alert, cooperative, fatigued, and no distress Head: Normocephalic, without obvious abnormality, atraumatic Neck: no adenopathy, no JVD, supple, symmetrical, trachea midline, and thyroid  not enlarged, symmetric, no tenderness/mass/nodules Lymph nodes: Cervical, supraclavicular, and axillary nodes normal. Resp: clear to auscultation bilaterally Back: symmetric, no curvature. ROM normal. No CVA tenderness. Cardio: regular rate and rhythm, S1, S2 normal, no murmur, click, rub or gallop GI: soft, non-tender; bowel sounds normal; no masses,  no organomegaly Extremities: extremities normal, atraumatic, no cyanosis or edema Neurologic: Alert and oriented X 3, normal strength and tone. Normal symmetric reflexes. Normal coordination and gait  ECOG PERFORMANCE STATUS: 1 - Symptomatic but completely ambulatory  Blood pressure 122/62, temperature 98 F (36.7 C), resp. rate 17, height 5' 8 (1.727 m), weight 164 lb 6.4 oz (74.6 kg).  LABORATORY DATA: Lab Results  Component Value Date   WBC 7.2 03/11/2024   HGB 12.6 03/11/2024   HCT 38.9 03/11/2024   MCV 95.6 03/11/2024   PLT 289 03/11/2024      Chemistry      Component Value Date/Time   NA 135 03/11/2024 1043   K 4.7 03/11/2024 1043   CL 101 03/11/2024 1043   CO2 28 03/11/2024 1043   BUN 21 03/11/2024 1043   CREATININE 1.37 (H) 03/11/2024 1043      Component Value Date/Time   CALCIUM  9.1 03/11/2024 1043   ALKPHOS 107 03/11/2024 1043   AST 15 03/11/2024 1043   ALT 13 03/11/2024 1043   BILITOT 0.3 03/11/2024 1043       RADIOGRAPHIC STUDIES: DG Chest Port 1 View Result  Date: 02/20/2024 CLINICAL DATA:  Status post CT-guided biopsy  of a left Torres nodule. Metastatic urothelial carcinoma. EXAM: PORTABLE CHEST 1 VIEW COMPARISON:  CT biopsy images 828 2025.  Chest radiograph 07/15/2023 FINDINGS: Single-view of the chest demonstrates a right jugular Port-A-Cath with the tip at the superior cavoatrial junction. Patchy nodular opacities in both lungs compatible with known pulmonary nodules. Negative for pneumothorax. Fullness in the left perihilar region compatible with known mediastinal lymphadenopathy. Heart size is normal. IMPRESSION: 1. Negative for pneumothorax following left Torres biopsy. 2. Bilateral pulmonary nodules and mediastinal lymphadenopathy. Electronically Signed   By: Juliene Balder M.D.   On: 02/20/2024 13:52   CT BIOPSY Result Date: 02/20/2024 INDICATION: 73 year old with metastatic urothelial carcinoma. Enlarging pulmonary nodules and request for tissue sampling. EXAM: CT-GUIDED BIOPSY OF A LEFT LOWER LOBE PULMONARY NODULE TECHNIQUE: Multidetector CT imaging of the chest was performed following the standard protocol without IV contrast. RADIATION DOSE REDUCTION: This exam was performed according to the departmental dose-optimization program which includes automated exposure control, adjustment of the mA and/or kV according to patient size and/or use of iterative reconstruction technique. MEDICATIONS: Benadryl  25 mg ANESTHESIA/SEDATION: Moderate (conscious) sedation was employed during this procedure. A total of Versed  2 mg and Fentanyl  100 mcg was administered intravenously by the radiology nurse. Total intra-service moderate Sedation Time: 28 minutes. The patient's level of consciousness and vital signs were monitored continuously by radiology nursing throughout the procedure under my direct supervision. COMPLICATIONS: None immediate. PROCEDURE: Informed written consent was obtained from the patient after a thorough discussion of the procedural risks, benefits and  alternatives. All questions were addressed. Maximal Sterile Barrier Technique was utilized including caps, mask, sterile gowns, sterile gloves, sterile drape, hand hygiene and skin antiseptic. A timeout was performed prior to the initiation of the procedure. Patient was placed prone. CT images of the chest were obtained. Subpleural nodule in the left lower lobe superior segment was targeted for biopsy. The left side of the back was prepped with chlorhexidine  and sterile field was created. Skin was anesthetized using 1% lidocaine . Small incision was made. Using CT guidance, a 17 gauge coaxial needle was directed into the subpleural nodule. Needle position was confirmed with CT. Two core biopsies were obtained with an 18 gauge core device. Following the second core biopsy, a very small pneumothorax was identified. The 17 gauge needle was removed while aspirating. Follow up CT images were obtained. Bandage placed over the puncture site. FINDINGS: Broad-based subpleural nodule in the left lower lobe superior segment was biopsied. This lesion measured roughly 3.0 x 1.7 cm. Biopsy needle confirmed within the lesion. Two adequate specimens obtained. Small amount of pleural air identified after the second core biopsy. The pleural air was aspirated or decreased after the 17 gauge needle was removed. IMPRESSION: CT-guided core biopsy of the subpleural nodule in the left lower lobe. Electronically Signed   By: Juliene Balder M.D.   On: 02/20/2024 13:24     ASSESSMENT AND PLAN: This is a very pleasant 73 years old African-American female with metastatic urothelial carcinoma that was initially diagnosed as stage II in September 2022 but has evidence for disease progression with pulmonary metastasis in November 2023.  She underwent several treatment in the past including systemic chemotherapy with carboplatin  and gemcitabine  followed by maintenance treatment with Avelumab  discontinued secondary to disease progression.  Then the  patient underwent treatment with erdafitinib  for 4 months discontinued secondary to disease progression.  Most recently she was treated with enfortumab vedotin  and unfortunately this was discontinued after 1 cycle of treatment secondary to  development of significant skin rash with tissue epidermal necrolysis.  She was seen for a second opinion by Dr. Joyice at [facility] and she recommended retesting for HER2 expression which unfortunately came back negative.  It was recommended for the patient to consider treatment with Sacituzumab Govitecan .  She started treatment with sacituzumab govitecan  at dose of 10 Mg/KG on days 1 and 8 every 3 weeks.  First dose was 11/06/2023.  She is status post 3 cycles.  Last dose was given 12/17/2023 discontinued secondary to disease progression. The patient had repeat CT scan of the chest, abdomen and pelvis performed recently.  I personally and independently reviewed the scan.  Unfortunately her scan showed evidence for disease progression. I had a lengthy discussion with the patient and her sister about her current condition and treatment options. Disease progression despite multiple lines of systemic chemotherapy, including carboplatin , gemcitabine , avelumab , idrafate, enfortumab vedotin , and sacituzumab govetican. Recent CT scan shows continued cancer growth. Limited treatment options remain, necessitating consideration of clinical trials. She was referred to [facility] cancer center and seen by Dr. Georgiann.  He recommended repeating a biopsy for additional molecular studies.  He also recommended treatment with Keytruda  for now.  She received the first dose of her treatment with Keytruda  200 mg IV 3 weeks ago.  She tolerated the first dose of her treatment fairly well. Assessment and Plan Assessment & Plan Metastatic urothelial carcinoma of bladder with pulmonary metastases Progression despite multiple lines of therapy, including carboplatin , gemcitabine , avelumab ,  erdafitinib , enfortumab vedotin , and sacituzumab govitecan . Currently on pembrolizumab  with one cycle completed. Recent biopsy of left lower lobe Torres nodule confirmed metastatic moderately differentiated urothelial carcinoma. HER2 testing negative, ruling out HER2-targeted therapies. - Administer pembrolizumab  200 mg IV today - Administer one more cycle of pembrolizumab  in three weeks - Schedule imaging in five weeks to assess treatment response - Advise against using prednisone  unless discussed due to potential counteraction with pembrolizumab  - Encourage regular exercise to improve energy levels The patient was advised to call immediately if she has any concerning symptoms in the interval.  The patient voices understanding of current disease status and treatment options and is in agreement with the current care plan.  All questions were answered. The patient knows to call the clinic with any problems, questions or concerns. We can certainly see the patient much sooner if necessary.  The total time spent in the appointment was 30 minutes including review of chart and various tests results, discussions about plan of care and coordination of care plan .   Disclaimer: This note was dictated with voice recognition software. Similar sounding words can inadvertently be transcribed and may not be corrected upon review.

## 2024-03-12 ENCOUNTER — Other Ambulatory Visit: Payer: Self-pay

## 2024-03-12 DIAGNOSIS — I739 Peripheral vascular disease, unspecified: Secondary | ICD-10-CM

## 2024-03-24 DIAGNOSIS — R5381 Other malaise: Secondary | ICD-10-CM | POA: Diagnosis not present

## 2024-03-29 NOTE — Progress Notes (Unsigned)
 [facility] OFFICE PROGRESS NOTE  Leigh Lung, MD [address] [place] [address]  DIAGNOSIS: Metastatic urothelial carcinoma that was initially diagnosed as stage II (T2, N0, M0) in September 2022 status post right nephroureterectomy as well as intravesical treatment and resection of superficial tumor in the bladder several times and March 2023 as well as July 2023.  The patient was found to have enlarging and new pulmonary nodules consistent with metastatic disease in November 2023.   PRIOR THERAPY: 1) Right nephroureterectomy as well as intravesical treatment and resection of superficial tumor in the bladder several times and March 2023 as well as July 2023.  2) Palliative systemic chemotherapy with carboplatin  for AUC of 5 on day 1 and gemcitabine  1000 mg/M2 on days 1 and 8 every 3 weeks. First cycle June 21, 2022. Status post 6 cycles. Her dose of carboplatin  was reduced to AUC of 4 and gemcitabine  to 800 mg/m2 starting from cycle #5 due to cytopenias. 3) Maintenance immunotherapy with Avelumab  800 mg IV every 2 weeks. First dose on 11/15/22. Status post 6 cycle of treatment. 4) Erdafitinib  (balversa ) 8 mg p.o daily. First dose 02/22/23. Status post 4 months of treatment. Discontinued in February 2025 due to disease progression.  5) Enfortumab vedotin  infusion therapy: once a week for three weeks, followed by one week off, repeated every four weeks. First dose was on 08/08/2023.  Status post 1 cycle.  This treatment was discontinued in early March 2025 secondary to significant skin rash and concerned about Morgan Torres and tissue epidermal necrolysis 6) Sacituzumab govitecan  (Trodelvy ) 10 mg/kg on days 1 and 8 every 3 weeks. First dose 11/05/2023. Status post 3 cycles discontinued secondary to disease progression.     CURRENT THERAPY: : Keytruda  200 Mg IV every 3 weeks.  First dose 02/18/2024.  Status post 2 cycles.   INTERVAL HISTORY: Morgan Torres 73 y.o.  female returns to the clinic today for a follow up visit accompanied by her sister. The patient was last seen by Dr. Sherrod on 03/11/24. She is currently undergoing treatment with keytruda . Overall, she tolerates it well. She denies any signs and symptoms of infection including nasal congestion, sore throat, skin infections, diarrhea, dysuria, cough, or shortness of breath.    She still continue to have fatigue. ***Diarrhea. She denies any fever, chills, or unexplained weight loss. She gets intermittent night sweats. Her breathing is ***. She denies cough. She denies any chest pain or hemoptysis.  She denies any vomiting or constipation.  She sometimes has mild nausea which is controlled. She denies any abdominal pain.    Repeat moleculars ***   She has a lot of orthopedic issues in her back. She saw her orthopedist for Er follow up who arranged for steriod injection in her back. She has a follow up later this month.    She is scheduled for *** with Dr. Carolynn. She is wondering how this will affect her appointments.    She is here for evaluation and repeat blood work before undergoing cycle #3.  MEDICAL HISTORY: Past Medical History:  Diagnosis Date   Anemia    Anxiety    Arthritis    hip, lumbar spine    Asthma    seasonal allergies    Bronchitis    Hx: of   Chronic kidney disease    COPD (chronic obstructive pulmonary disease) (HCC)    Coronary artery disease    Depression    Family history of  adverse reaction to anesthesia    Sister hard to wake up   Fibromyalgia    Hypertension    Hypothyroidism    Lumbar herniated disc    Metastatic urothelial carcinoma (HCC)    Myocardial infarction (HCC) 06/25/2012   followed by Dr. Mona, treated medically, no stents   Peripheral arterial disease    of right foot   Pneumonia    Hx: of yrs ago   Sciatica    Sleep apnea    Small bowel obstruction (HCC)    several yrs ago    ALLERGIES:  is allergic to diclofenac, padcev   [enfortumab vedotin ], ciprofloxacin, codeine, and hydrocodone.  MEDICATIONS:  Current Outpatient Medications  Medication Sig Dispense Refill   acetaminophen  (TYLENOL ) 500 MG tablet Take 1 tablet (500 mg total) by mouth every 6 (six) hours as needed for mild pain. 30 tablet 0   albuterol  (PROVENTIL  HFA;VENTOLIN  HFA) 108 (90 Base) MCG/ACT inhaler Inhale 2 puffs into the lungs every 6 (six) hours as needed for wheezing or shortness of breath. 1 Inhaler 0   amLODipine  (NORVASC ) 10 MG tablet Take 10 mg by mouth daily.     aspirin  EC 81 MG tablet Take 81 mg by mouth daily. Swallow whole.     benzonatate  (TESSALON ) 100 MG capsule Take 100 mg by mouth 3 (three) times daily as needed for cough.     calcium  acetate (PHOSLO ) 667 MG tablet Take 667 mg by mouth 3 (three) times daily with meals.     cilostazol  (PLETAL ) 50 MG tablet Take 50 mg by mouth 2 (two) times daily.     clotrimazole  (LOTRIMIN ) 1 % external solution Apply topically 2 (two) times daily. (Patient taking differently: Apply 1 Application topically 2 (two) times daily.) 30 mL 0   cyclobenzaprine  (FLEXERIL ) 10 MG tablet Take 1 tablet (10 mg total) by mouth 2 (two) times daily as needed for muscle spasms. 20 tablet 0   EPINEPHrine  (EPIPEN  2-PAK) 0.3 mg/0.3 mL DEVI Inject 0.3 mLs (0.3 mg total) into the muscle once as needed (for severe allergic reaction). CAll 911 immediately if you have to use this medicine (Patient taking differently: Inject 0.3 mg into the muscle once as needed (for a severe allergic reaction- CALL 9-1-1 IMMEDIATELY IF USED).) 1 Device 1   gabapentin  (NEURONTIN ) 300 MG capsule Take 300 mg by mouth at bedtime.     guaiFENesin  (MUCINEX ) 600 MG 12 hr tablet Take 2 tablets (1,200 mg total) by mouth 2 (two) times daily. (Patient taking differently: Take 1,200 mg by mouth 2 (two) times daily as needed for to loosen phlegm.) 120 tablet 0   ipratropium (ATROVENT ) 0.06 % nasal spray Place 2 sprays into the nose daily as needed for  rhinitis.     lidocaine -prilocaine  (EMLA ) cream Apply 1 Application topically as needed. (Patient taking differently: Apply 1 Application topically as needed (port).) 30 g 2   loperamide  (IMODIUM ) 2 MG capsule TAKE 2 TABS BY MOUTH WITH FIRST LOOSE STOOL, THEN 1 TAB WITH EACH ADDITIONAL LOOSE STOOL AS NEEDED. DO NOT EXCEED 8 TABS IN A 24-HOUR PERIOD 60 capsule 3   LORazepam  (ATIVAN ) 0.5 MG tablet Take 0.5 mg by mouth daily as needed for anxiety.     magnesium  oxide (MAG-OX) 400 (240 Mg) MG tablet TAKE 1 TABLET BY MOUTH EVERY DAY 90 tablet 1   methocarbamol  (ROBAXIN ) 500 MG tablet Take 1 tablet (500 mg total) by mouth 2 (two) times daily. 20 tablet 0   metoprolol  tartrate (LOPRESSOR ) 25 MG  tablet Take 1 tablet by mouth 2 (two) times daily. (Patient taking differently: Take 1 tablet by mouth as needed.)     MILK OF MAGNESIA 400 MG/5ML suspension Take 15-30 mLs by mouth daily as needed for mild constipation.     montelukast  (SINGULAIR ) 10 MG tablet Take 10 mg by mouth daily.     nicotine  (NICODERM CQ  - DOSED IN MG/24 HOURS) 21 mg/24hr patch Place 21 mg onto the skin daily.     nicotine  polacrilex (NICORETTE ) 4 MG gum Take 4 mg by mouth as needed for smoking cessation.     nitroGLYCERIN  (NITROSTAT ) 0.4 MG SL tablet Place 1 tablet (0.4 mg total) under the tongue every 5 (five) minutes x 3 doses as needed for chest pain. 25 tablet 2   oxybutynin  (DITROPAN ) 5 MG tablet Take 1 tablet (5 mg total) by mouth every 8 (eight) hours as needed for bladder spasms. 30 tablet 1   oxyCODONE  (ROXICODONE ) 5 MG immediate release tablet Take 1 tablet (5 mg total) by mouth every 4 (four) hours as needed for severe pain (pain score 7-10). 15 tablet 0   oxyCODONE -acetaminophen  (PERCOCET/ROXICET) 5-325 MG tablet Take 1 tablet by mouth every 6 (six) hours as needed for severe pain (pain score 7-10). 15 tablet 0   phenazopyridine  (PYRIDIUM ) 200 MG tablet Take 1 tablet (200 mg total) by mouth 3 (three) times daily as needed (for  pain with urination). 30 tablet 0   Potassium & Sodium Phosphates (PHOSPHORUS  W/SOD & POTASSIUM) 280-160-250 MG PACK TAKE 1 PACKAGE BY MOUTH 2 (TWO) TIMES DAILY. 30 each 1   prochlorperazine  (COMPAZINE ) 10 MG tablet Take 1 tablet (10 mg total) by mouth every 6 (six) hours as needed. 30 tablet 2   sodium chloride  1 g tablet TAKE 1 TABLET (1 G TOTAL) BY MOUTH DAILY. SPLIT 1 GRAM INTO TWO HALVES AND TAKE BY MOUTH ONCE A DAY (Patient taking differently: Take 0.5 g by mouth in the morning and at bedtime.) 30 tablet 0   SYNTHROID  100 MCG tablet TAKE 1 TABLET BY MOUTH DAILY BEFORE BREAKFAST. 90 tablet 1   temazepam  (RESTORIL ) 15 MG capsule Take 1 capsule (15 mg total) by mouth at bedtime as needed for sleep. 30 capsule 0   traMADol  (ULTRAM ) 50 MG tablet Take 50 mg by mouth every 6 (six) hours as needed (for pain).     No current facility-administered medications for this visit.    SURGICAL HISTORY:  Past Surgical History:  Procedure Laterality Date   ABDOMINAL SURGERY     resection of small intestine   ANTERIOR CRUCIATE LIGAMENT REPAIR Bilateral    BACK SURGERY     fusion of lumbar   CARDIAC CATHETERIZATION  06/25/2012   COLON SURGERY     resection for bowel - for obstruction  6 to 7 yrs ago per pt on 01-05-2022   COLONOSCOPY     Hx; of   DILATION AND CURETTAGE OF UTERUS     EYE SURGERY Bilateral    cataracts removed   HERNIA REPAIR  02/23/1969   inguinal hernia    IR IMAGING GUIDED PORT INSERTION  06/07/2022   LEFT HEART CATHETERIZATION WITH CORONARY ANGIOGRAM N/A 03/30/2013   Procedure: LEFT HEART CATHETERIZATION WITH CORONARY ANGIOGRAM;  Surgeon: Alm LELON Clay, MD;  Location: [facility] CATH LAB;  Service: Cardiovascular;  Laterality: N/A;   left thumb surgery     yrs ago   ROBOT ASSITED LAPAROSCOPIC NEPHROURETERECTOMY Left 03/09/2021   Procedure: XI ROBOT ASSITED LAPAROSCOPIC NEPHROURETERECTOMY/ CYSTOSCOPY  WITH LEFT URETEROSCOPY WITH TRANSURETHRAL RESECTION OF URETERAL ORIFICE;  Surgeon:  Devere Lonni Righter, MD;  Location: [facility] OR[facility];  Service: Urology;  Laterality: Left;   SALIVARY GLAND SURGERY Left    approached from inside mouth & side of neck 1970-1980   TONSILLECTOMY     as child   TOTAL HIP ARTHROPLASTY Left 05/28/2014   Procedure: LEFT TOTAL HIP ARTHROPLASTY;  Surgeon: LELON JONETTA Shari Mickey., MD;  Location: [facility] OR;  Service: Orthopedics;  Laterality: Left;   TRANSURETHRAL RESECTION OF BLADDER TUMOR N/A 08/23/2021   Procedure: TRANSURETHRAL RESECTION OF BLADDER TUMOR (TURBT) WITH CYSTOSCOPY/ POSTOPERATIVE INSTILLATION OF GEMCITABINE /retrograde pyelogram and stent placement (right);  Surgeon: Devere Lonni Righter, MD;  Location: [facility];  Service: Urology;  Laterality: N/A;   TRANSURETHRAL RESECTION OF BLADDER TUMOR N/A 01/10/2022   Procedure: TRANSURETHRAL RESECTION OF BLADDER TUMOR (TURBT) WITH CYSTOSCOPY / GEMCITABINE  INSTILLATION post-operatively;  Surgeon: Devere Lonni Righter, MD;  Location: [facility];  Service: Urology;  Laterality: N/A;  ONLY NEEDS 30 MIN   TRANSURETHRAL RESECTION OF BLADDER TUMOR N/A 12/25/2023   Procedure: TURBT (TRANSURETHRAL RESECTION OF BLADDER TUMOR);  Surgeon: Devere Lonni Righter, MD;  Location: [facility];  Service: Urology;  Laterality: N/A;  CYSTOSCOPY WITH TURBT (TRANSURETHRAL RESECTION OF BLADDER TUMOR)    REVIEW OF SYSTEMS:   Review of Systems  Constitutional: Negative for appetite change, chills, fatigue, fever and unexpected weight change.  HENT:   Negative for mouth sores, nosebleeds, sore throat and trouble swallowing.   Eyes: Negative for eye problems and icterus.  Respiratory: Negative for cough, hemoptysis, shortness of breath and wheezing.   Cardiovascular: Negative for chest pain and leg swelling.  Gastrointestinal: Negative for abdominal pain, constipation, diarrhea, nausea and vomiting.  Genitourinary: Negative for bladder incontinence, difficulty urinating, dysuria, frequency and  hematuria.   Musculoskeletal: Negative for back pain, gait problem, neck pain and neck stiffness.  Skin: Negative for itching and rash.  Neurological: Negative for dizziness, extremity weakness, gait problem, headaches, light-headedness and seizures.  Hematological: Negative for adenopathy. Does not bruise/bleed easily.  Psychiatric/Behavioral: Negative for confusion, depression and sleep disturbance. The patient is not nervous/anxious.     PHYSICAL EXAMINATION:  There were no vitals taken for this visit.  ECOG PERFORMANCE STATUS: {CHL ONC ECOG [id number]  Physical Exam  Constitutional: Oriented to person, place, and time and well-developed, well-nourished, and in no distress. No distress.  HENT:  Head: Normocephalic and atraumatic.  Mouth/Throat: Oropharynx is clear and moist. No oropharyngeal exudate.  Eyes: Conjunctivae are normal. Right eye exhibits no discharge. Left eye exhibits no discharge. No scleral icterus.  Neck: Normal range of motion. Neck supple.  Cardiovascular: Normal rate, regular rhythm, normal heart sounds and intact distal pulses.   Pulmonary/Chest: Effort normal and breath sounds normal. No respiratory distress. No wheezes. No rales.  Abdominal: Soft. Bowel sounds are normal. Exhibits no distension and no mass. There is no tenderness.  Musculoskeletal: Normal range of motion. Exhibits no edema.  Lymphadenopathy:    No cervical adenopathy.  Neurological: Alert and oriented to person, place, and time. Exhibits normal muscle tone. Gait normal. Coordination normal.  Skin: Skin is warm and dry. No rash noted. Not diaphoretic. No erythema. No pallor.  Psychiatric: Mood, memory and judgment normal.  Vitals reviewed.  LABORATORY DATA: Lab Results  Component Value Date   WBC 7.2 03/11/2024   HGB 12.6 03/11/2024   HCT 38.9 03/11/2024   MCV 95.6 03/11/2024   PLT 289 03/11/2024  Chemistry      Component Value Date/Time   NA 135 03/11/2024 1043   K 4.7  03/11/2024 1043   CL 101 03/11/2024 1043   CO2 28 03/11/2024 1043   BUN 21 03/11/2024 1043   CREATININE 1.37 (H) 03/11/2024 1043      Component Value Date/Time   CALCIUM  9.1 03/11/2024 1043   ALKPHOS 107 03/11/2024 1043   AST 15 03/11/2024 1043   ALT 13 03/11/2024 1043   BILITOT 0.3 03/11/2024 1043       RADIOGRAPHIC STUDIES:  No results found.   ASSESSMENT/PLAN:  This is a very pleasant 73 year old African-American female with metastatic urothelial carcinoma.  She was initially diagnosed as a stage II (T2, N0, M0) in September 2022.    She is status post right nephroureterectomy as well as  intravesical treatment and resection of superficial tumor in the bladder several times and March 2023 as well as July 2023.  The patient was found to have enlarging and new pulmonary nodules consistent with metastatic disease in November 2023.   Dr. Sherrod sent her tissue for foundation 1 and PD-L1 expression. She does not have any actionable mutations   She completed 6 cycles of systemic chemotherapy with carboplatin  for an AUC of 5 on day 1 and gemcitabine  1000 milligrams per meter squared on days 1 and 8 IV every 3 weeks with Neulasta  support.  Her dose was reduced to carboplatin  for an AUC of 4 and gemcitabine  800 mg/m due to cytopenias transfusion support.   Dr. Sherrod recommended maintenance immunotherapy with Avelumab  2 mg IV every 2 weeks.  She is status post 6 cycles of treatment and she tolerated it well without any concerning adverse side.    She had repeat imaging in August 2024 showing interval enlargement of bilateral pulmonary nodules consistent with progressive metastatic disease.    She started on EGFR therapy with Balversa  8 milligrams p.o. daily. She started this on 02/22/23. She had been on this for 4 months.She recently had disease progression and this was discontinued.    Therefore, Dr. Sherrod started her on enfortumab vedotin  once a week for three weeks, followed by  one week off, repeated every four weeks.  She started this on 08/08/2023. She is status post 1 treatment. 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    She also saw Dr. Joyice at [facility] on/9/25.  They discussed the options.   She was then on sacituzumab govitecan  (Trodelvy ). Regarding clinical trials, while [facility] does have an open study for FGFR3 altered UC but GFR needs to be >45 and hers is 30s-41 in recent past so she would not qualify for this. She is status post 3 cycles. This was discontinued due to disease progression.    She was referred to [facility] center and seen by Dr. Georgiann.  He recommended repeating a biopsy for additional molecular studies.  He also recommended treatment with Keytruda  for now. She is status post 2 cycles.   Labs were reviewed. Recommend she *** with cycle #3 today as scheduled.   We will see her back in 3 weeks for evaluation and repeat blood work before undergoing cycle #4.   I will arrange for a restaging CT of the CAP prior to her next cycle of treatment.   ***I think moleculars are supposed to be resent from 8/28 biopsy?   The patient was advised to call immediately if she has any concerning symptoms in the interval. The patient voices understanding of current disease status  and treatment options and is in agreement with the current care plan. All questions were answered. The patient knows to call the clinic with any problems, questions or concerns. We can certainly see the patient much sooner if necessary       No orders of the defined types were placed in this encounter.    I spent {CHL ONC TIME VISIT - DTPQU:[id number]} counseling the patient face to face. The total time spent in the appointment was {CHL ONC TIME VISIT - DTPQU:[id number]}.  Reana Chacko L Kalisi Bevill, PA-C 03/29/24

## 2024-03-31 ENCOUNTER — Inpatient Hospital Stay: Attending: Internal Medicine

## 2024-03-31 ENCOUNTER — Inpatient Hospital Stay: Admitting: Physician Assistant

## 2024-03-31 ENCOUNTER — Inpatient Hospital Stay

## 2024-03-31 VITALS — BP 156/66 | HR 89 | Temp 97.2°F | Resp 16 | Wt 166.2 lb

## 2024-03-31 DIAGNOSIS — D759 Disease of blood and blood-forming organs, unspecified: Secondary | ICD-10-CM | POA: Diagnosis not present

## 2024-03-31 DIAGNOSIS — R21 Rash and other nonspecific skin eruption: Secondary | ICD-10-CM | POA: Diagnosis not present

## 2024-03-31 DIAGNOSIS — Z9226 Personal history of immune checkpoint inhibitor therapy: Secondary | ICD-10-CM | POA: Diagnosis not present

## 2024-03-31 DIAGNOSIS — E039 Hypothyroidism, unspecified: Secondary | ICD-10-CM | POA: Diagnosis not present

## 2024-03-31 DIAGNOSIS — Z90710 Acquired absence of both cervix and uterus: Secondary | ICD-10-CM | POA: Insufficient documentation

## 2024-03-31 DIAGNOSIS — G8929 Other chronic pain: Secondary | ICD-10-CM | POA: Diagnosis not present

## 2024-03-31 DIAGNOSIS — L513 Stevens-Johnson syndrome-toxic epidermal necrolysis overlap syndrome: Secondary | ICD-10-CM | POA: Diagnosis not present

## 2024-03-31 DIAGNOSIS — C7910 Secondary malignant neoplasm of unspecified urinary organs: Secondary | ICD-10-CM

## 2024-03-31 DIAGNOSIS — C680 Malignant neoplasm of urethra: Secondary | ICD-10-CM | POA: Insufficient documentation

## 2024-03-31 DIAGNOSIS — C679 Malignant neoplasm of bladder, unspecified: Secondary | ICD-10-CM

## 2024-03-31 DIAGNOSIS — Z79620 Long term (current) use of immunosuppressive biologic: Secondary | ICD-10-CM | POA: Insufficient documentation

## 2024-03-31 DIAGNOSIS — I252 Old myocardial infarction: Secondary | ICD-10-CM | POA: Insufficient documentation

## 2024-03-31 DIAGNOSIS — N1831 Chronic kidney disease, stage 3a: Secondary | ICD-10-CM | POA: Insufficient documentation

## 2024-03-31 DIAGNOSIS — Z79899 Other long term (current) drug therapy: Secondary | ICD-10-CM | POA: Insufficient documentation

## 2024-03-31 DIAGNOSIS — Z87820 Personal history of traumatic brain injury: Secondary | ICD-10-CM | POA: Diagnosis not present

## 2024-03-31 DIAGNOSIS — C7800 Secondary malignant neoplasm of unspecified lung: Secondary | ICD-10-CM | POA: Diagnosis not present

## 2024-03-31 DIAGNOSIS — F17200 Nicotine dependence, unspecified, uncomplicated: Secondary | ICD-10-CM | POA: Insufficient documentation

## 2024-03-31 DIAGNOSIS — Z885 Allergy status to narcotic agent status: Secondary | ICD-10-CM | POA: Insufficient documentation

## 2024-03-31 DIAGNOSIS — Z905 Acquired absence of kidney: Secondary | ICD-10-CM | POA: Insufficient documentation

## 2024-03-31 DIAGNOSIS — I700 Atherosclerosis of aorta: Secondary | ICD-10-CM | POA: Diagnosis not present

## 2024-03-31 DIAGNOSIS — G4700 Insomnia, unspecified: Secondary | ICD-10-CM | POA: Diagnosis not present

## 2024-03-31 DIAGNOSIS — Z5112 Encounter for antineoplastic immunotherapy: Secondary | ICD-10-CM | POA: Insufficient documentation

## 2024-03-31 DIAGNOSIS — I129 Hypertensive chronic kidney disease with stage 1 through stage 4 chronic kidney disease, or unspecified chronic kidney disease: Secondary | ICD-10-CM | POA: Diagnosis not present

## 2024-03-31 DIAGNOSIS — J439 Emphysema, unspecified: Secondary | ICD-10-CM | POA: Insufficient documentation

## 2024-03-31 DIAGNOSIS — Z9089 Acquired absence of other organs: Secondary | ICD-10-CM | POA: Insufficient documentation

## 2024-03-31 DIAGNOSIS — Z7982 Long term (current) use of aspirin: Secondary | ICD-10-CM | POA: Insufficient documentation

## 2024-03-31 DIAGNOSIS — Z8701 Personal history of pneumonia (recurrent): Secondary | ICD-10-CM | POA: Diagnosis not present

## 2024-03-31 DIAGNOSIS — M549 Dorsalgia, unspecified: Secondary | ICD-10-CM | POA: Diagnosis not present

## 2024-03-31 DIAGNOSIS — Z79890 Hormone replacement therapy (postmenopausal): Secondary | ICD-10-CM | POA: Diagnosis not present

## 2024-03-31 DIAGNOSIS — J4489 Other specified chronic obstructive pulmonary disease: Secondary | ICD-10-CM | POA: Diagnosis not present

## 2024-03-31 DIAGNOSIS — Z881 Allergy status to other antibiotic agents status: Secondary | ICD-10-CM | POA: Insufficient documentation

## 2024-03-31 LAB — CMP (CANCER CENTER ONLY)
ALT: 9 U/L (ref 0–44)
AST: 14 U/L — ABNORMAL LOW (ref 15–41)
Albumin: 3.8 g/dL (ref 3.5–5.0)
Alkaline Phosphatase: 116 U/L (ref 38–126)
Anion gap: 6 (ref 5–15)
BUN: 26 mg/dL — ABNORMAL HIGH (ref 8–23)
CO2: 26 mmol/L (ref 22–32)
Calcium: 9.7 mg/dL (ref 8.9–10.3)
Chloride: 98 mmol/L (ref 98–111)
Creatinine: 1.23 mg/dL — ABNORMAL HIGH (ref 0.44–1.00)
GFR, Estimated: 46 mL/min — ABNORMAL LOW (ref >60)
Glucose, Bld: 95 mg/dL (ref 70–99)
Potassium: 4.6 mmol/L (ref 3.5–5.1)
Sodium: 130 mmol/L — ABNORMAL LOW (ref 135–145)
Total Bilirubin: 0.3 mg/dL (ref 0.0–1.2)
Total Protein: 7.4 g/dL (ref 6.5–8.1)

## 2024-03-31 LAB — CBC WITH DIFFERENTIAL (CANCER CENTER ONLY)
Abs Immature Granulocytes: 0.03 K/uL (ref 0.00–0.07)
Basophils Absolute: 0 K/uL (ref 0.0–0.1)
Basophils Relative: 1 %
Eosinophils Absolute: 0.1 K/uL (ref 0.0–0.5)
Eosinophils Relative: 1 %
HCT: 38.1 % (ref 36.0–46.0)
Hemoglobin: 12.8 g/dL (ref 12.0–15.0)
Immature Granulocytes: 1 %
Lymphocytes Relative: 37 %
Lymphs Abs: 2.4 K/uL (ref 0.7–4.0)
MCH: 30.8 pg (ref 26.0–34.0)
MCHC: 33.6 g/dL (ref 30.0–36.0)
MCV: 91.8 fL (ref 80.0–100.0)
Monocytes Absolute: 0.6 K/uL (ref 0.1–1.0)
Monocytes Relative: 10 %
Neutro Abs: 3.3 K/uL (ref 1.7–7.7)
Neutrophils Relative %: 50 %
Platelet Count: 336 K/uL (ref 150–400)
RBC: 4.15 MIL/uL (ref 3.87–5.11)
RDW: 14 % (ref 11.5–15.5)
WBC Count: 6.5 K/uL (ref 4.0–10.5)
nRBC: 0 % (ref 0.0–0.2)

## 2024-03-31 LAB — TSH: TSH: 26.6 u[IU]/mL — ABNORMAL HIGH (ref 0.350–4.500)

## 2024-03-31 MED ORDER — SODIUM CHLORIDE 0.9 % IV SOLN: INTRAVENOUS | Status: DC

## 2024-03-31 MED ORDER — SODIUM CHLORIDE 0.9 % IV SOLN
200.0000 mg | Freq: Once | INTRAVENOUS | Status: AC
  Administered 2024-03-31: 200 mg via INTRAVENOUS
  Filled 2024-03-31: qty 200

## 2024-03-31 NOTE — Patient Instructions (Signed)
 [facility] - A DEPT OF [facility]  Discharge Instructions: Thank you for choosing [facility] to provide your oncology and hematology care.   If you have a lab appointment with the Cancer Center, please go directly to the Cancer Center and check in at the registration area.   Wear comfortable clothing and clothing appropriate for easy access to any Portacath or PICC line.   We strive to give you quality time with your provider. You may need to reschedule your appointment if you arrive late (15 or more minutes).  Arriving late affects you and other patients whose appointments are after yours.  Also, if you miss three or more appointments without notifying the office, you may be dismissed from the clinic at the provider's discretion.      For prescription refill requests, have your pharmacy contact our office and allow 72 hours for refills to be completed.    Today you received the following chemotherapy and/or immunotherapy agents: pembrolizumab [facility])      To help prevent nausea and vomiting after your treatment, we encourage you to take your nausea medication as directed.  BELOW ARE SYMPTOMS THAT SHOULD BE REPORTED IMMEDIATELY: *FEVER GREATER THAN 100.4 F (38 C) OR HIGHER *CHILLS OR SWEATING *NAUSEA AND VOMITING THAT IS NOT CONTROLLED WITH YOUR NAUSEA MEDICATION *UNUSUAL SHORTNESS OF BREATH *UNUSUAL BRUISING OR BLEEDING *URINARY PROBLEMS (pain or burning when urinating, or frequent urination) *BOWEL PROBLEMS (unusual diarrhea, constipation, pain near the anus) TENDERNESS IN MOUTH AND THROAT WITH OR WITHOUT PRESENCE OF ULCERS (sore throat, sores in mouth, or a toothache) UNUSUAL RASH, SWELLING OR PAIN  UNUSUAL VAGINAL DISCHARGE OR ITCHING   Items with * indicate a potential emergency and should be followed up as soon as possible or go to the Emergency Department if any problems should occur.  Please show the CHEMOTHERAPY ALERT CARD or  IMMUNOTHERAPY ALERT CARD at check-in to the Emergency Department and triage nurse.  Should you have questions after your visit or need to cancel or reschedule your appointment, please contact [facility] - A DEPT OF [facility]  Dept: [phone]  and follow the prompts.  Office hours are 8:00 a.m. to 4:30 p.m. Monday - Friday. Please note that voicemails left after 4:00 p.m. may not be returned until the following business day.  We are closed weekends and major holidays. You have access to a nurse at all times for urgent questions. Please call the main number to the clinic Dept: [phone] and follow the prompts.   For any non-urgent questions, you may also contact your provider using MyChart. We now offer e-Visits for anyone 31 and older to request care online for non-urgent symptoms. For details visit [email or web address].   Also download the MyChart app! Go to the app store, search "MyChart", open the app, select [facility], and log in with your MyChart username and password.

## 2024-04-01 ENCOUNTER — Other Ambulatory Visit: Payer: Self-pay | Admitting: Physician Assistant

## 2024-04-01 ENCOUNTER — Telehealth: Payer: Self-pay

## 2024-04-01 DIAGNOSIS — E039 Hypothyroidism, unspecified: Secondary | ICD-10-CM

## 2024-04-01 LAB — T4: T4, Total: 10.6 ug/dL (ref 4.5–12.0)

## 2024-04-01 MED ORDER — LEVOTHYROXINE SODIUM 112 MCG PO TABS: 112.0000 ug | ORAL_TABLET | Freq: Every day | ORAL | 2 refills | Status: DC

## 2024-04-01 NOTE — Telephone Encounter (Signed)
 Spoke with patient in regards to lab results.  Per Cassie, PA, patients TSH is elevated and the dose of Synthroid  was increased to 112 mcg daily. Patient voiced understanding.

## 2024-04-04 DIAGNOSIS — Z79899 Other long term (current) drug therapy: Secondary | ICD-10-CM | POA: Diagnosis not present

## 2024-04-07 ENCOUNTER — Other Ambulatory Visit: Payer: Self-pay

## 2024-04-14 ENCOUNTER — Ambulatory Visit

## 2024-04-14 ENCOUNTER — Ambulatory Visit (HOSPITAL_COMMUNITY)
Admission: RE | Admit: 2024-04-14 | Discharge: 2024-04-14 | Disposition: A | Discharge status: Home / Self Care | Source: Ambulatory Visit | Attending: Vascular Surgery | Admitting: Vascular Surgery

## 2024-04-14 DIAGNOSIS — I739 Peripheral vascular disease, unspecified: Secondary | ICD-10-CM | POA: Diagnosis not present

## 2024-04-14 LAB — VAS US ABI WITH/WO TBI
Left ABI: 0.89
Right ABI: 0.59

## 2024-04-16 ENCOUNTER — Ambulatory Visit (HOSPITAL_COMMUNITY)
Admission: RE | Admit: 2024-04-16 | Discharge: 2024-04-16 | Disposition: A | Discharge status: Home / Self Care | Source: Ambulatory Visit | Attending: Physician Assistant | Admitting: Physician Assistant

## 2024-04-16 DIAGNOSIS — C679 Malignant neoplasm of bladder, unspecified: Secondary | ICD-10-CM | POA: Insufficient documentation

## 2024-04-16 DIAGNOSIS — C7800 Secondary malignant neoplasm of unspecified lung: Secondary | ICD-10-CM | POA: Insufficient documentation

## 2024-04-16 DIAGNOSIS — I700 Atherosclerosis of aorta: Secondary | ICD-10-CM | POA: Diagnosis not present

## 2024-04-16 DIAGNOSIS — J439 Emphysema, unspecified: Secondary | ICD-10-CM | POA: Diagnosis not present

## 2024-04-21 ENCOUNTER — Ambulatory Visit: Admitting: Internal Medicine

## 2024-04-21 ENCOUNTER — Other Ambulatory Visit

## 2024-04-21 ENCOUNTER — Ambulatory Visit

## 2024-04-21 ENCOUNTER — Inpatient Hospital Stay: Admitting: Internal Medicine

## 2024-04-21 ENCOUNTER — Other Ambulatory Visit: Payer: Self-pay

## 2024-04-21 ENCOUNTER — Inpatient Hospital Stay

## 2024-04-21 VITALS — BP 151/68 | HR 91 | Temp 97.7°F | Resp 17 | Ht 68.0 in | Wt 167.0 lb

## 2024-04-21 DIAGNOSIS — C679 Malignant neoplasm of bladder, unspecified: Secondary | ICD-10-CM

## 2024-04-21 DIAGNOSIS — C7800 Secondary malignant neoplasm of unspecified lung: Secondary | ICD-10-CM | POA: Diagnosis not present

## 2024-04-21 DIAGNOSIS — C7910 Secondary malignant neoplasm of unspecified urinary organs: Secondary | ICD-10-CM

## 2024-04-21 DIAGNOSIS — Z5112 Encounter for antineoplastic immunotherapy: Secondary | ICD-10-CM | POA: Diagnosis not present

## 2024-04-21 LAB — CBC WITH DIFFERENTIAL (CANCER CENTER ONLY)
Abs Immature Granulocytes: 0.02 K/uL (ref 0.00–0.07)
Basophils Absolute: 0 K/uL (ref 0.0–0.1)
Basophils Relative: 1 %
Eosinophils Absolute: 0.1 K/uL (ref 0.0–0.5)
Eosinophils Relative: 1 %
HCT: 36.7 % (ref 36.0–46.0)
Hemoglobin: 12.3 g/dL (ref 12.0–15.0)
Immature Granulocytes: 0 %
Lymphocytes Relative: 35 %
Lymphs Abs: 2.1 K/uL (ref 0.7–4.0)
MCH: 30.3 pg (ref 26.0–34.0)
MCHC: 33.5 g/dL (ref 30.0–36.0)
MCV: 90.4 fL (ref 80.0–100.0)
Monocytes Absolute: 0.6 K/uL (ref 0.1–1.0)
Monocytes Relative: 11 %
Neutro Abs: 3.2 K/uL (ref 1.7–7.7)
Neutrophils Relative %: 52 %
Platelet Count: 341 K/uL (ref 150–400)
RBC: 4.06 MIL/uL (ref 3.87–5.11)
RDW: 13.9 % (ref 11.5–15.5)
WBC Count: 6 K/uL (ref 4.0–10.5)
nRBC: 0 % (ref 0.0–0.2)

## 2024-04-21 LAB — CMP (CANCER CENTER ONLY)
ALT: 9 U/L (ref 0–44)
AST: 14 U/L — ABNORMAL LOW (ref 15–41)
Albumin: 3.9 g/dL (ref 3.5–5.0)
Alkaline Phosphatase: 117 U/L (ref 38–126)
Anion gap: 6 (ref 5–15)
BUN: 25 mg/dL — ABNORMAL HIGH (ref 8–23)
CO2: 27 mmol/L (ref 22–32)
Calcium: 9.4 mg/dL (ref 8.9–10.3)
Chloride: 100 mmol/L (ref 98–111)
Creatinine: 1.31 mg/dL — ABNORMAL HIGH (ref 0.44–1.00)
GFR, Estimated: 43 mL/min — ABNORMAL LOW (ref >60)
Glucose, Bld: 109 mg/dL — ABNORMAL HIGH (ref 70–99)
Potassium: 4.6 mmol/L (ref 3.5–5.1)
Sodium: 133 mmol/L — ABNORMAL LOW (ref 135–145)
Total Bilirubin: 0.2 mg/dL (ref 0.0–1.2)
Total Protein: 7.2 g/dL (ref 6.5–8.1)

## 2024-04-21 NOTE — Progress Notes (Signed)
 [facility] [phone]   [phone]) [phone]  OFFICE PROGRESS NOTE  Morgan Lung, MD [address] [place] [address]  DIAGNOSIS: Metastatic urothelial carcinoma that was initially diagnosed as stage II (T2, N0, M0) in September 2022 status post right nephroureterectomy as well as intravesical treatment and resection of superficial tumor in the bladder several times and March 2023 as well as July 2023.  The patient was found to have enlarging and new pulmonary nodules consistent with metastatic disease in November 2023.   Repeat biopsy showed HER2 expression of 1+  PRIOR THERAPY: 1) Right nephroureterectomy as well as intravesical treatment and resection of superficial tumor in the bladder several times and March 2023 as well as July 2023.  2) Palliative systemic chemotherapy with carboplatin  for AUC of 5 on day 1 and gemcitabine  1000 mg/M2 on days 1 and 8 every 3 weeks. First cycle June 21, 2022. Status post 6 cycles. Her dose of carboplatin  was reduced to AUC of 4 and gemcitabine  to 800 mg/m2 starting from cycle #5 due to cytopenias. 3) Maintenance immunotherapy with Avelumab  800 mg IV every 2 weeks. First dose on 11/15/22. Status post 6 cycle of treatment. 4) Erdafitinib  (balversa ) 8 mg p.o daily. First dose 02/22/23. Status post 4 months of treatment. Discontinued in February 2025 due to disease progression.  5) Enfortumab vedotin  infusion therapy: once a week for three weeks, followed by one week off, repeated every four weeks. First dose was on 08/08/2023.  Status post 1 cycle.  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 6) sacituzumab govitecan  (Trodelvy ) 10 mg/kg on days 1 and 8 every 3 weeks. First dose 11/05/2023.  Status post 3 cycles discontinued secondary to disease progression. 7) Keytruda  200 Mg IV every 3 weeks.  First  dose 02/18/2024.  Status post 3 cycles.  This was discontinued secondary to disease progression.  CURRENT THERAPY: None  INTERVAL HISTORY: Morgan Torres 73 y.o. female returns to the clinic today for follow-up visit accompanied by her sister. Discussed the use of AI scribe software for clinical note transcription with the patient, who gave verbal consent to proceed.  History of Present Illness Morgan Torres is a 73 year old female with metastatic urothelial carcinoma who presents for evaluation and discussion of her recent CT scan. She is accompanied by her sister.  She has a history of metastatic urothelial carcinoma, initially diagnosed as stage II in September 2022. By March 2023, evidence of metastatic disease was found. She has undergone multiple treatments including palliative systemic chemotherapy with carboplatin  and gemcitabine , followed by maintenance avelumab , which was discontinued due to disease progression. She was also treated with erdafetinib for four months, which was discontinued due to disease progression.  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 Subsequently, she received sacituzumab govitecan  for three cycles, which was also discontinued due to disease progression. Most recently, she was treated with Keytruda  200 mg IV every three weeks for three cycles, but unfortunately, she was found to have disease progression.  During the review of symptoms, she feels good but lacks energy. No side effects from Keytruda , such as rash, diarrhea, nausea, or vomiting. She also denies recent weight loss or night sweats and mentions gaining a few pounds.    MEDICAL HISTORY: Past Medical History:  Diagnosis Date  Anemia    Anxiety    Arthritis    hip, lumbar spine    Asthma    seasonal allergies    Bronchitis    Hx: of   Chronic kidney disease    COPD (chronic obstructive pulmonary  disease) (HCC)    Coronary artery disease    Depression    Family history of adverse reaction to anesthesia    Sister hard to wake up   Fibromyalgia    Hypertension    Hypothyroidism    Lumbar herniated disc    Metastatic urothelial carcinoma (HCC)    Myocardial infarction (HCC) 06/25/2012   followed by Dr. Mona, treated medically, no stents   Peripheral arterial disease    of right foot   Pneumonia    Hx: of yrs ago   Sciatica    Sleep apnea    Small bowel obstruction (HCC)    several yrs ago    ALLERGIES:  is allergic to diclofenac, padcev  [enfortumab vedotin ], ciprofloxacin, codeine, and hydrocodone.  MEDICATIONS:  Current Outpatient Medications  Medication Sig Dispense Refill   acetaminophen  (TYLENOL ) 500 MG tablet Take 1 tablet (500 mg total) by mouth every 6 (six) hours as needed for mild pain. 30 tablet 0   albuterol  (PROVENTIL  HFA;VENTOLIN  HFA) 108 (90 Base) MCG/ACT inhaler Inhale 2 puffs into the lungs every 6 (six) hours as needed for wheezing or shortness of breath. 1 Inhaler 0   aspirin  EC 81 MG tablet Take 81 mg by mouth daily. Swallow whole.     cilostazol  (PLETAL ) 50 MG tablet Take 50 mg by mouth 2 (two) times daily.     cyclobenzaprine  (FLEXERIL ) 10 MG tablet Take 1 tablet (10 mg total) by mouth 2 (two) times daily as needed for muscle spasms. 20 tablet 0   EPINEPHrine  (EPIPEN  2-PAK) 0.3 mg/0.3 mL DEVI Inject 0.3 mLs (0.3 mg total) into the muscle once as needed (for severe allergic reaction). CAll 911 immediately if you have to use this medicine (Patient taking differently: Inject 0.3 mg into the muscle once as needed (for a severe allergic reaction- CALL 9-1-1 IMMEDIATELY IF USED).) 1 Device 1   guaiFENesin  (MUCINEX ) 600 MG 12 hr tablet Take 2 tablets (1,200 mg total) by mouth 2 (two) times daily. (Patient taking differently: Take 1,200 mg by mouth 2 (two) times daily as needed for to loosen phlegm.) 120 tablet 0   ipratropium (ATROVENT ) 0.06 % nasal spray Place 2  sprays into the nose daily as needed for rhinitis.     levothyroxine  (SYNTHROID ) 112 MCG tablet Take 1 tablet (112 mcg total) by mouth daily before breakfast. 30 tablet 2   loperamide  (IMODIUM ) 2 MG capsule TAKE 2 TABS BY MOUTH WITH FIRST LOOSE STOOL, THEN 1 TAB WITH EACH ADDITIONAL LOOSE STOOL AS NEEDED. DO NOT EXCEED 8 TABS IN A 24-HOUR PERIOD 60 capsule 3   LORazepam  (ATIVAN ) 0.5 MG tablet Take 0.5 mg by mouth daily as needed for anxiety.     magnesium  oxide (MAG-OX) 400 (240 Mg) MG tablet TAKE 1 TABLET BY MOUTH EVERY DAY 90 tablet 1   methocarbamol  (ROBAXIN ) 500 MG tablet Take 1 tablet (500 mg total) by mouth 2 (two) times daily. 20 tablet 0   MILK OF MAGNESIA 400 MG/5ML suspension Take 15-30 mLs by mouth daily as needed for mild constipation.     montelukast  (SINGULAIR ) 10 MG tablet Take 10 mg by mouth daily.     nicotine  (NICODERM CQ  - DOSED IN MG/24 HOURS) 21 mg/24hr patch  Place 21 mg onto the skin daily.     nicotine  polacrilex (NICORETTE ) 4 MG gum Take 4 mg by mouth as needed for smoking cessation.     nitroGLYCERIN  (NITROSTAT ) 0.4 MG SL tablet Place 1 tablet (0.4 mg total) under the tongue every 5 (five) minutes x 3 doses as needed for chest pain. 25 tablet 2   phenazopyridine  (PYRIDIUM ) 200 MG tablet Take 1 tablet (200 mg total) by mouth 3 (three) times daily as needed (for pain with urination). 30 tablet 0   prochlorperazine  (COMPAZINE ) 10 MG tablet Take 1 tablet (10 mg total) by mouth every 6 (six) hours as needed. 30 tablet 2   temazepam  (RESTORIL ) 15 MG capsule Take 1 capsule (15 mg total) by mouth at bedtime as needed for sleep. 30 capsule 0   traMADol  (ULTRAM ) 50 MG tablet Take 50 mg by mouth every 6 (six) hours as needed (for pain).     No current facility-administered medications for this visit.    SURGICAL HISTORY:  Past Surgical History:  Procedure Laterality Date   ABDOMINAL SURGERY     resection of small intestine   ANTERIOR CRUCIATE LIGAMENT REPAIR Bilateral    BACK  SURGERY     fusion of lumbar   CARDIAC CATHETERIZATION  06/25/2012   COLON SURGERY     resection for bowel - for obstruction  6 to 7 yrs ago per pt on 01-05-2022   COLONOSCOPY     Hx; of   DILATION AND CURETTAGE OF UTERUS     EYE SURGERY Bilateral    cataracts removed   HERNIA REPAIR  02/23/1969   inguinal hernia    IR IMAGING GUIDED PORT INSERTION  06/07/2022   LEFT HEART CATHETERIZATION WITH CORONARY ANGIOGRAM N/A 03/30/2013   Procedure: LEFT HEART CATHETERIZATION WITH CORONARY ANGIOGRAM;  Surgeon: Alm LELON Clay, MD;  Location: [facility];  Service: Cardiovascular;  Laterality: N/A;   left thumb surgery     yrs ago   ROBOT ASSITED LAPAROSCOPIC NEPHROURETERECTOMY Left 03/09/2021   Procedure: XI ROBOT ASSITED LAPAROSCOPIC NEPHROURETERECTOMY/ CYSTOSCOPY WITH LEFT URETEROSCOPY WITH TRANSURETHRAL RESECTION OF URETERAL ORIFICE;  Surgeon: Devere Lonni Righter, MD;  Location: [facility];  Service: Urology;  Laterality: Left;   SALIVARY GLAND SURGERY Left    approached from inside mouth & side of neck 1970-1980   TONSILLECTOMY     as child   TOTAL HIP ARTHROPLASTY Left 05/28/2014   Procedure: LEFT TOTAL HIP ARTHROPLASTY;  Surgeon: LELON JONETTA Shari Mickey., MD;  Location: [facility];  Service: Orthopedics;  Laterality: Left;   TRANSURETHRAL RESECTION OF BLADDER TUMOR N/A 08/23/2021   Procedure: TRANSURETHRAL RESECTION OF BLADDER TUMOR (TURBT) WITH CYSTOSCOPY/ POSTOPERATIVE INSTILLATION OF GEMCITABINE /retrograde pyelogram and stent placement (right);  Surgeon: Devere Lonni Righter, MD;  Location: [facility];  Service: Urology;  Laterality: N/A;   TRANSURETHRAL RESECTION OF BLADDER TUMOR N/A 01/10/2022   Procedure: TRANSURETHRAL RESECTION OF BLADDER TUMOR (TURBT) WITH CYSTOSCOPY / GEMCITABINE  INSTILLATION post-operatively;  Surgeon: Devere Lonni Righter, MD;  Location: [facility];  Service: Urology;  Laterality: N/A;  ONLY NEEDS 30 MIN   TRANSURETHRAL RESECTION OF  BLADDER TUMOR N/A 12/25/2023   Procedure: TURBT (TRANSURETHRAL RESECTION OF BLADDER TUMOR);  Surgeon: Devere Lonni Righter, MD;  Location: [facility];  Service: Urology;  Laterality: N/A;  CYSTOSCOPY WITH TURBT (TRANSURETHRAL RESECTION OF BLADDER TUMOR)    REVIEW OF SYSTEMS:  Constitutional: positive for fatigue Eyes: negative Ears, nose, mouth, throat, and face: negative Respiratory: negative Cardiovascular: negative  Gastrointestinal: negative Genitourinary:negative Integument/breast: negative Hematologic/lymphatic: negative Musculoskeletal:negative Neurological: negative Behavioral/Psych: negative Endocrine: negative Allergic/Immunologic: negative   PHYSICAL EXAMINATION: General appearance: alert, cooperative, fatigued, and no distress Head: Normocephalic, without obvious abnormality, atraumatic Neck: no adenopathy, no JVD, supple, symmetrical, trachea midline, and thyroid  not enlarged, symmetric, no tenderness/mass/nodules Lymph nodes: Cervical, supraclavicular, and axillary nodes normal. Resp: clear to auscultation bilaterally Back: symmetric, no curvature. ROM normal. No CVA tenderness. Cardio: regular rate and rhythm, S1, S2 normal, no murmur, click, rub or gallop GI: soft, non-tender; bowel sounds normal; no masses,  no organomegaly Extremities: extremities normal, atraumatic, no cyanosis or edema Neurologic: Alert and oriented X 3, normal strength and tone. Normal symmetric reflexes. Normal coordination and gait  ECOG PERFORMANCE STATUS: 1 - Symptomatic but completely ambulatory  Blood pressure (!) 151/68, pulse 91, temperature 97.7 F (36.5 C), temperature source Temporal, resp. rate 17, height 5' 8 (1.727 m), weight 167 lb (75.8 kg), SpO2 98%.  LABORATORY DATA: Lab Results  Component Value Date   WBC 6.0 04/21/2024   HGB 12.3 04/21/2024   HCT 36.7 04/21/2024   MCV 90.4 04/21/2024   PLT 341 04/21/2024      Chemistry      Component Value Date/Time   NA 130 (L)  03/31/2024 1411   K 4.6 03/31/2024 1411   CL 98 03/31/2024 1411   CO2 26 03/31/2024 1411   BUN 26 (H) 03/31/2024 1411   CREATININE 1.23 (H) 03/31/2024 1411      Component Value Date/Time   CALCIUM  9.7 03/31/2024 1411   ALKPHOS 116 03/31/2024 1411   AST 14 (L) 03/31/2024 1411   ALT 9 03/31/2024 1411   BILITOT 0.3 03/31/2024 1411       RADIOGRAPHIC STUDIES: CT CHEST ABDOMEN PELVIS WO CONTRAST Result Date: 04/19/2024 CLINICAL DATA:  Metastatic disease evaluation, bladder cancer, assess treatment response * Tracking Code: BO * EXAM: CT CHEST, ABDOMEN AND PELVIS WITHOUT CONTRAST TECHNIQUE: Multidetector CT imaging of the chest, abdomen and pelvis was performed following the standard protocol without IV contrast. RADIATION DOSE REDUCTION: This exam was performed according to the departmental dose-optimization program which includes automated exposure control, adjustment of the mA and/or kV according to patient size and/or use of iterative reconstruction technique. COMPARISON:  01/02/2024 FINDINGS: CT CHEST FINDINGS Cardiovascular: Right chest port catheter. Severe aortic atherosclerosis. Normal heart size. Extensive three-vessel coronary artery calcifications. No pericardial effusion. Mediastinum/Nodes: Interval enlargement of bulky mediastinal lymph nodes, index pretracheal node measuring 3.8 x 2.7 cm, previously 2.9 x 2.5 cm (series 2, image 23). Additionally, there are newly enlarged right-sided lower cervical lymph nodes, measuring up to 2.1 x 1.8 cm (series 2, image 7). Thyroid  gland, trachea, and esophagus demonstrate no significant findings. Lungs/Pleura: Moderate emphysema. Multiple bilateral pulmonary nodules are again seen. Some nodules have decreased in size, for example a spiculated subpleural nodule in the superior segment left lower lobe measuring 1.6 x 1.0 cm, previously 2.5 x 1.8 cm (series 4, image 58), other nodules are however new or enlarged, for example a new nodule in the anterior  right lower lobe measuring 0.9 x 0.8 cm (series 4, image 85) and an enlarged subpleural nodule in the dependent right lower lobe measuring 2.2 x 1.4 cm, previously 1.3 x 0.9 cm (series 4, image 110). No pleural effusion or pneumothorax. Musculoskeletal: No chest wall abnormality. No acute osseous findings. CT ABDOMEN PELVIS FINDINGS Hepatobiliary: No solid liver abnormality is seen. No gallstones, gallbladder wall thickening, or biliary dilatation. Pancreas: Unremarkable. No pancreatic ductal dilatation or  surrounding inflammatory changes. Spleen: Normal in size without significant abnormality. Adrenals/Urinary Tract: Adrenal glands are unremarkable. Left nephrectomy. Right kidney is normal in noncontrast appearance, without renal calculi, solid lesion, or hydronephrosis. Evaluation of the bladder very limited by decompression, lack of intravenous contrast, and dense metallic streak artifact from left hip arthroplasty. No obvious abnormality. Stomach/Bowel: Stomach is within normal limits. Appendix not clearly visualized. No evidence of bowel wall thickening, distention, or inflammatory changes. Vascular/Lymphatic: Severe aortic atherosclerosis. No enlarged abdominal or pelvic lymph nodes. Reproductive: Hysterectomy. Other: No abdominal wall hernia or abnormality. No ascites. Musculoskeletal: No acute osseous findings.  Left hip arthroplasty. IMPRESSION: 1. Interval enlargement of bulky mediastinal lymph nodes, as well as newly enlarged right-sided lower cervical lymph nodes, consistent with worsened nodal metastatic disease. 2. Multiple bilateral pulmonary nodules are again seen. Mixed response to treatment with new nodules, enlarged nodules, and diminished nodules. 3. Evaluation of the bladder very limited by decompression, lack of intravenous contrast, and dense metallic streak artifact from left hip arthroplasty. No obvious abnormality. 4. Left nephrectomy. 5. Emphysema. 6. Coronary artery disease. Aortic  Atherosclerosis (ICD10-I70.0) and Emphysema (ICD10-J43.9). Electronically Signed   By: Marolyn JONETTA Jaksch M.D.   On: 04/19/2024 09:07   VAS US  ABI WITH/WO TBI Result Date: 04/14/2024  LOWER EXTREMITY DOPPLER STUDY Patient Name:  Morgan [facility]  Date of Exam:   04/14/2024 Medical Rec #: [id number]       Accession #:    [id number] Date of Birth: Dec 31, 1950      Patient Gender: F Patient Age:   28 years Exam Location:  [address] Procedure:      VAS US  ABI WITH/WO TBI Referring Phys: DEBBY ROBERTSON --------------------------------------------------------------------------------  Indications: Claudication. High Risk Factors: Hypertension, hyperlipidemia, current smoker, prior MI,                    coronary artery disease.  Comparison Study: 02/13/23 Performing Technologist: Garnette Rockers  Examination Guidelines: A complete evaluation includes at minimum, Doppler waveform signals and systolic blood pressure reading at the level of bilateral brachial, anterior tibial, and posterior tibial arteries, when vessel segments are accessible. Bilateral testing is considered an integral part of a complete examination. Photoelectric Plethysmograph (PPG) waveforms and toe systolic pressure readings are included as required and additional duplex testing as needed. Limited examinations for reoccurring indications may be performed as noted.  ABI Findings: +---------+------------------+-----+-------------------+--------+ Right    Rt Pressure (mmHg)IndexWaveform           Comment  +---------+------------------+-----+-------------------+--------+ Brachial 155                                                +---------+------------------+-----+-------------------+--------+ PTA      92                0.59 biphasic                    +---------+------------------+-----+-------------------+--------+ DP       78                0.50 dampened monophasic          +---------+------------------+-----+-------------------+--------+ Great Toe0                 0.00 Absent                      +---------+------------------+-----+-------------------+--------+ +---------+------------------+-----+--------+-------+  Left     Lt Pressure (mmHg)IndexWaveformComment +---------+------------------+-----+--------+-------+ Brachial 157                                    +---------+------------------+-----+--------+-------+ PTA      139               0.89 biphasic        +---------+------------------+-----+--------+-------+ DP       137               0.87 biphasic        +---------+------------------+-----+--------+-------+ Great Toe21                0.13 Abnormal        +---------+------------------+-----+--------+-------+ +-------+-----------+-----------+------------+------------+ ABI/TBIToday's ABIToday's TBIPrevious ABIPrevious TBI +-------+-----------+-----------+------------+------------+ Right  0.59       0          0.73        0            +-------+-----------+-----------+------------+------------+ Left   0.89       0.13       1.25        0            +-------+-----------+-----------+------------+------------+  Left ABIs appear decreased compared to prior study on 02/13/23. Left TBIs appear increased compared to prior study on 02/13/23.  Summary: Right: Resting right ankle-brachial index indicates moderate right lower extremity arterial disease. The right toe-brachial index is abnormal.  Left: Resting left ankle-brachial index indicates mild left lower extremity arterial disease. The left toe-brachial index is abnormal.  *See table(s) above for measurements and observations.  Electronically signed by Lonni Gaskins MD on 04/14/2024 at 2:07:25 PM.    Final      ASSESSMENT AND PLAN: This is a very pleasant 73 years old African-American female with metastatic urothelial carcinoma that was initially diagnosed as stage II in  September 2022 but has evidence for disease progression with pulmonary metastasis in November 2023.  She underwent several treatment in the past including systemic chemotherapy with carboplatin  and gemcitabine  followed by maintenance treatment with Avelumab  discontinued secondary to disease progression.  Then the patient underwent treatment with erdafitinib  for 4 months discontinued secondary to disease progression.  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  She was seen for a second opinion by Dr. Joyice at [facility] and she recommended retesting for HER2 expression which unfortunately came back negative.  It was recommended for the patient to consider treatment with Sacituzumab Govitecan .  She started treatment with sacituzumab govitecan  at dose of 10 Mg/KG on days 1 and 8 every 3 weeks.  First dose was 11/06/2023.  She is status post 3 cycles.  Last dose was given 12/17/2023 discontinued secondary to disease progression. The patient had repeat CT scan of the chest, abdomen and pelvis performed recently.  I personally and independently reviewed the scan.  Unfortunately her scan showed evidence for disease progression. I had a lengthy discussion with the patient and her sister about her current condition and treatment options. Disease progression despite multiple lines of systemic chemotherapy, including carboplatin , gemcitabine , avelumab , idrafate, enfortumab vedotin , and sacituzumab govetican. Recent CT scan shows continued cancer growth. Limited treatment options remain, necessitating consideration of clinical trials. She was referred to [facility] cancer center and seen by Dr.  Homies.  He recommended repeating a biopsy for additional molecular studies.  This was repeated and she has 1+ HER2 expression. He also recommended treatment with  Keytruda  for now.  She received 3 cycles of treatment with Keytruda  and tolerated it fairly well. She had repeat CT scan of the chest, abdomen and pelvis performed recently.  I personally independently reviewed the scan and discussed the results with the patient and her sister.  Unfortunately her scan showed clear evidence for disease progression on the treatment with Keytruda . Assessment and Plan Assessment & Plan Metastatic urothelial carcinoma with progressive disease Initially diagnosed as stage II in September 2022, with evidence of metastatic disease in March 2023. Multiple treatments including carboplatin , gemcitabine , avelumab , erdafetinib, enfortumab vedotin , sacituzumab govitecan , and pembrolizumab  (Keytruda ) have been attempted, all resulting in disease progression or adverse reactions. Recent CT scan shows interval enlargement of bulky lymph nodes, newly enlarged right-sided cervical lymph nodes, worsened nodal metastatic disease, and multiple bilateral pulmonary nodules with new and enlarged nodules. Radiation is not feasible due to the extensive number of nodules. Current treatment options at this facility are exhausted. She may be a candidate for clinical trials due to good performance status. - Refer to [facility] for further evaluation and potential treatment options, including clinical trials. - Cancel all current treatment appointments until further notice from [facility].  Hypertension Hypertension with persistently high blood pressure readings. - Monitor blood pressure closely. - Consult with primary care physician to adjust blood pressure medication. The patient was advised to call immediately if she has any concerning symptoms in the interval. The patient voices understanding of current disease status and treatment options and is in agreement with the current care plan.  All questions were answered. The patient knows to call the clinic with any problems, questions or concerns. We can  certainly see the patient much sooner if necessary.  The total time spent in the appointment was 30 minutes including review of chart and various tests results, discussions about plan of care and coordination of care plan .   Disclaimer: This note was dictated with voice recognition software. Similar sounding words can inadvertently be transcribed and may not be corrected upon review.

## 2024-04-22 ENCOUNTER — Other Ambulatory Visit: Payer: Self-pay | Admitting: Internal Medicine

## 2024-04-22 DIAGNOSIS — C689 Malignant neoplasm of urinary organ, unspecified: Secondary | ICD-10-CM | POA: Diagnosis not present

## 2024-04-22 DIAGNOSIS — Z79899 Other long term (current) drug therapy: Secondary | ICD-10-CM | POA: Diagnosis not present

## 2024-04-22 DIAGNOSIS — C772 Secondary and unspecified malignant neoplasm of intra-abdominal lymph nodes: Secondary | ICD-10-CM | POA: Diagnosis not present

## 2024-04-24 DIAGNOSIS — R5381 Other malaise: Secondary | ICD-10-CM | POA: Diagnosis not present

## 2024-05-05 ENCOUNTER — Ambulatory Visit: Admitting: Vascular Surgery

## 2024-05-08 ENCOUNTER — Ambulatory Visit: Attending: Vascular Surgery | Admitting: Physician Assistant

## 2024-05-08 VITALS — BP 131/77 | HR 79 | Temp 97.8°F | Wt 167.0 lb

## 2024-05-08 DIAGNOSIS — I739 Peripheral vascular disease, unspecified: Secondary | ICD-10-CM
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
## 2024-05-12 ENCOUNTER — Inpatient Hospital Stay

## 2024-05-12 ENCOUNTER — Inpatient Hospital Stay: Admitting: Physician Assistant

## 2024-05-23 ENCOUNTER — Other Ambulatory Visit: Payer: Self-pay | Admitting: Physician Assistant

## 2024-05-27 ENCOUNTER — Other Ambulatory Visit: Payer: Self-pay | Admitting: Urology

## 2024-05-27 ENCOUNTER — Telehealth: Payer: Self-pay | Admitting: Internal Medicine

## 2024-05-27 NOTE — Telephone Encounter (Signed)
   Pre-operative Risk Assessment    Patient Name: Morgan Torres  DOB: 25-Feb-1951 MRN: [id number]   Date of last office visit: 12/24/23 Date of next office visit: TBD    Request for Surgical Clearance    Procedure:  cystoscopy with transurethral resection of bladder tumor    Date of Surgery:  Clearance 06/03/24                                Surgeon:  Dr. Lonni Pang  Surgeon's Group or Practice Name:  [facility]  Phone number:  [phone] Fax number:  [phone]   Type of Clearance Requested:   - Medical  - Pharmacy:  Hold Aspirin  5 days    Type of Anesthesia:  General    Additional requests/questions:    Bonney Larraine Salt   05/27/2024, 2:05 PM

## 2024-05-27 NOTE — Telephone Encounter (Signed)
 Attempted to reach patient, unable to reach patient due to voicemail being full. Will try again later. 1st attempt.

## 2024-05-27 NOTE — Telephone Encounter (Signed)
   Name: Morgan Torres  DOB: 1950-08-15  MRN: [id number]  Primary Cardiologist: Vinie JAYSON Maxcy, MD   Preoperative team, please contact this patient and set up a phone call appointment for further preoperative risk assessment. Please obtain consent and complete medication review. Thank you for your help.  I confirm that guidance regarding antiplatelet and oral anticoagulation therapy has been completed and, if necessary, noted below.  I also confirmed the patient resides in the state of [place] . As per [facility] Medical Board telemedicine laws, the patient must reside in the state in which the provider is licensed.   Jon Garre Vee Bahe, PA 05/27/2024, 3:00 PM [facility]

## 2024-05-28 NOTE — Patient Instructions (Addendum)
 SURGICAL WAITING ROOM VISITATION Patients having surgery or a procedure may have no more than 2 support people in the waiting area - these visitors may rotate in the visitor waiting room.   Due to an increase in RSV and influenza rates and associated hospitalizations, children ages 90 and under may not visit patients in [facility] hospitals. If the patient needs to stay at the hospital during part of their recovery, the visitor guidelines for inpatient rooms apply.  PRE-OP VISITATION  Pre-op nurse will coordinate an appropriate time for 1 support person to accompany the patient in pre-op.  This support person may not rotate.  This visitor will be contacted when the time is appropriate for the visitor to come back in the pre-op area.  Please refer to the [facility] website for the visitor guidelines for Inpatients (after your surgery is over and you are in a regular room).  You are not required to quarantine at this time prior to your surgery. However, you must do this: Hand Hygiene often Do NOT share personal items Notify your provider if you are in close contact with someone who has COVID or you develop fever 100.4 or greater, new onset of sneezing, cough, sore throat, shortness of breath or body aches.  If you test positive for Covid or have been in contact with anyone that has tested positive in the last 10 days please notify you surgeon.    Your procedure is scheduled on:  06/03/24  Report to [facility] Main Entrance: [place] entrance where the [employer] is available.   Report to admitting at: 8:45 AM  Call this number if you have any questions or problems the morning of surgery [phone]  FOLLOW ANY ADDITIONAL PRE OP INSTRUCTIONS YOU RECEIVED FROM YOUR SURGEON'S OFFICE!!!  Do not eat food or drink fluids after Midnight the night prior to your surgery/procedure.   Oral Hygiene is also important to reduce your risk of infection.        Remember - BRUSH YOUR TEETH  THE MORNING OF SURGERY WITH YOUR REGULAR TOOTHPASTE  Do NOT smoke after Midnight the night before surgery.  STOP TAKING all Vitamins, Herbs and supplements 1 week before your surgery.   Take ONLY these medicines the morning of surgery with A SIP OF WATER : levothyroxine ,montelukast .Tylenol ,clonazepam,prochlorperazine  as needed.Use inhalers as usual and bring them.   Before surgery.Stop taking ___________on __________as instructed by _____________.  Stop taking ____________as directed by your Surgeon/Cardiologist.  Contact your Surgeon/Cardiologist for instructions on Anticoagulant Therapy prior to surgery.  If You have been diagnosed with Sleep Apnea - Bring CPAP mask and tubing day of surgery. We will provide you with a CPAP machine on the day of your surgery.                   You may not have any metal on your body including hair pins, jewelry, and body piercing  Do not wear make-up, lotions, powders, perfumes / cologne, or deodorant  Do not wear nail polish including gel and S&S, artificial / acrylic nails, or any other type of covering on natural nails including finger and toenails. If you have artificial nails, gel coating, etc., that needs to be removed by a nail salon, Please have this removed prior to surgery. Not doing so may mean that your surgery could be cancelled or delayed if the Surgeon or anesthesia staff feels like they are unable to monitor you safely.   Do not shave 48 hours prior to surgery  to avoid nicks in your skin which may contribute to postoperative infections.   Contacts, Hearing Aids, dentures or bridgework may not be worn into surgery. DENTURES WILL BE REMOVED PRIOR TO SURGERY PLEASE DO NOT APPLY Poly grip OR ADHESIVES!!!  You may bring a small overnight bag with you on the day of surgery, only pack items that are not valuable. [place] IS NOT RESPONSIBLE   FOR VALUABLES THAT ARE LOST OR STOLEN.   Patients discharged on the day of surgery will not be  allowed to drive home.  Someone NEEDS to stay with you for the first 24 hours after anesthesia.  Do not bring your home medications to the hospital. The Pharmacy will dispense medications listed on your medication list to you during your admission in the Hospital.  Special Instructions: Bring a copy of your healthcare power of attorney and living will documents the day of surgery, if you wish to have them scanned into your [facility] Records- EPIC  Please read over the following fact sheets you were given: IF YOU HAVE QUESTIONS ABOUT YOUR PRE-OP INSTRUCTIONS, PLEASE CALL [phone]   [facility] - Preparing for Surgery      Before surgery, you can play an important role.  Because skin is not sterile, your skin needs to be as free of germs as possible.  You can reduce the number of germs on your skin by washing with CHG (chlorahexidine gluconate) soap before surgery.  CHG is an antiseptic cleaner which kills germs and bonds with the skin to continue killing germs even after washing. Please DO NOT use if you have an allergy to CHG or antibacterial soaps.  If your skin becomes reddened/irritated stop using the CHG and inform your nurse when you arrive at Short Stay. Do not shave (including legs and underarms) for at least 48 hours prior to the first CHG shower.  You may shave your face/neck.  Please follow these instructions carefully:  1.  Shower with CHG Soap the night before surgery ONLY (DO NOT USE THE SOAP THE MORNING OF SURGERY).  2.  If you choose to wash your hair, wash your hair first as usual with your normal  shampoo.  3.  After you shampoo, rinse your hair and body thoroughly to remove the shampoo.                             4.  Use CHG as you would any other liquid soap.  You can apply chg directly to the skin and wash.  Gently with a scrungie or clean washcloth.  5.  Apply the CHG Soap to your body ONLY FROM THE NECK DOWN.   Do not use on face/ open                            Wound or open sores. Avoid contact with eyes, ears mouth and genitals (private parts).                       Wash face,  Genitals (private parts) with your normal soap.             6.  Wash thoroughly, paying special attention to the area where your  surgery  will be performed.  7.  Thoroughly rinse your body with warm water  from the neck down.  8.  DO NOT shower/wash with your normal soap  after using and rinsing off the CHG Soap.                9.  Pat yourself dry with a clean towel.            10.  Wear clean pajamas.            11.  Place clean sheets on your bed the night of your first shower and do not  sleep with pets.  Day of Surgery : Do not apply any CHG, lotions/deodorants the morning of surgery.  Please wear clean clothes to the hospital/surgery center.   FAILURE TO FOLLOW THESE INSTRUCTIONS MAY RESULT IN THE CANCELLATION OF YOUR SURGERY  PATIENT SIGNATURE_________________________________  NURSE SIGNATURE__________________________________  ________________________________________________________________________

## 2024-05-29 NOTE — Progress Notes (Signed)
 C2 D8 of Paclitaxel  Nursing Assessment Neurological- alert and oriented x4 Cardiopulmonary- no issues Genitourinary- no issues Gastrointestinal- no issues Skin- no new rashes, wounds, or areas of concern Psychosocial- no issues Nutritional- no issues Fatigue level- 5 Oral mucositis- 0 Pain score- 4  See flowsheet for falls assessment, IV assessment, and vital signs. RCP accessed PTA, blood return noted, de-accessed prior to DC.  Patient tolerated treatment well and was discharged from treatment room.

## 2024-05-29 NOTE — Telephone Encounter (Signed)
 Attempted to reach the patient, but her phone was not accepting calls at the moment. Third and final attempt. Will route back to the surgeon's office.

## 2024-05-29 NOTE — Telephone Encounter (Signed)
 Attempted to call patient to schedule her televisit appointment. No answer left a vm to call back.

## 2024-06-01 ENCOUNTER — Telehealth (HOSPITAL_BASED_OUTPATIENT_CLINIC_OR_DEPARTMENT_OTHER): Payer: Self-pay | Admitting: *Deleted

## 2024-06-01 ENCOUNTER — Other Ambulatory Visit: Payer: Self-pay

## 2024-06-01 ENCOUNTER — Encounter (HOSPITAL_COMMUNITY)
Admission: RE | Admit: 2024-06-01 | Discharge: 2024-06-01 | Disposition: A | Source: Ambulatory Visit | Attending: Urology

## 2024-06-01 ENCOUNTER — Encounter (HOSPITAL_COMMUNITY): Payer: Self-pay

## 2024-06-01 ENCOUNTER — Ambulatory Visit: Admitting: Nurse Practitioner

## 2024-06-01 VITALS — BP 126/64 | HR 86 | Temp 98.7°F | Ht 68.0 in | Wt 166.0 lb

## 2024-06-01 DIAGNOSIS — Z01810 Encounter for preprocedural cardiovascular examination: Secondary | ICD-10-CM

## 2024-06-01 DIAGNOSIS — I10 Essential (primary) hypertension: Secondary | ICD-10-CM

## 2024-06-01 HISTORY — DX: Chronic kidney disease, unspecified: N18.9

## 2024-06-01 NOTE — Progress Notes (Signed)
 For Anesthesia: PCP - Leigh Lung, MD  Cardiologist - Mona Vinie BROCKS, MD  Clearance: Pending Bowel Prep reminder:  Chest x-ray - 02/20/24 EKG - 12/24/23 Stress Test - 01/31/21 ECHO - 02/02/21 Cardiac Cath - 2014 Pacemaker/ICD device last checked: Pacemaker orders received: Device Rep notified:  Spinal Cord Stimulator:N/A  Sleep Study - Yes CPAP - N/A  Fasting Blood Sugar - N/A Checks Blood Sugar _____ times a day Date and result of last Hgb A1c-  Last dose of GLP1 agonist- N/A GLP1 instructions: Hold 7 days prior to schedule (Hold 24 hours-daily)   Last dose of SGLT-2 inhibitors- N/A SGLT-2 instructions: Hold 72 hours prior to surgery  Blood Thinner Instructions: Pletal : Last dose 05/31/24.Pt. was advise to hold it until after surgery as per pharmacy protocol. Last Dose: Time last taken:  Aspirin  Instructions:On hold since last week. Last Dose: Time last taken:  Activity level:    Unable to go up a flight of stairs without shortness of breath     Anesthesia review: Hx: MI,CKD,COPD,CAD,HTN,OSA(NO CPAP),Smoker.  Patient denies shortness of breath, fever, cough and chest pain at PAT appointment   Patient verbalized understanding of instructions that were reviewed over the telephone.

## 2024-06-01 NOTE — Telephone Encounter (Signed)
 Pt has been added on today ok per [name]. NP preop APP today. Med rec and consent are done.      Patient Consent for Virtual Visit        Morgan Torres has provided verbal consent on 06/01/2024 for a virtual visit (video or telephone).   CONSENT FOR VIRTUAL VISIT FOR:  Morgan Torres  By participating in this virtual visit I agree to the following:  I hereby voluntarily request, consent and authorize [facility] and its employed or contracted physicians, physician assistants, nurse practitioners or other licensed health care professionals (the Practitioner), to provide me with telemedicine health care services (the "Services) as deemed necessary by the treating Practitioner. I acknowledge and consent to receive the Services by the Practitioner via telemedicine. I understand that the telemedicine visit will involve communicating with the Practitioner through live audiovisual communication technology and the disclosure of certain medical information by electronic transmission. I acknowledge that I have been given the opportunity to request an in-person assessment or other available alternative prior to the telemedicine visit and am voluntarily participating in the telemedicine visit.  I understand that I have the right to withhold or withdraw my consent to the use of telemedicine in the course of my care at any time, without affecting my right to future care or treatment, and that the Practitioner or I may terminate the telemedicine visit at any time. I understand that I have the right to inspect all information obtained and/or recorded in the course of the telemedicine visit and may receive copies of available information for a reasonable fee.  I understand that some of the potential risks of receiving the Services via telemedicine include:  Delay or interruption in medical evaluation due to technological equipment failure or disruption; Information transmitted may not be sufficient  (e.g. poor resolution of images) to allow for appropriate medical decision making by the Practitioner; and/or  In rare instances, security protocols could fail, causing a breach of personal health information.  Furthermore, I acknowledge that it is my responsibility to provide information about my medical history, conditions and care that is complete and accurate to the best of my ability. I acknowledge that Practitioner's advice, recommendations, and/or decision may be based on factors not within their control, such as incomplete or inaccurate data provided by me or distortions of diagnostic images or specimens that may result from electronic transmissions. I understand that the practice of medicine is not an exact science and that Practitioner makes no warranties or guarantees regarding treatment outcomes. I acknowledge that a copy of this consent can be made available to me via my patient portal [facility] MyChart), or I can request a printed copy by calling the office of [facility].    I understand that my insurance will be billed for this visit.   I have read or had this consent read to me. I understand the contents of this consent, which adequately explains the benefits and risks of the Services being provided via telemedicine.  I have been provided ample opportunity to ask questions regarding this consent and the Services and have had my questions answered to my satisfaction. I give my informed consent for the services to be provided through the use of telemedicine in my medical care

## 2024-06-01 NOTE — Telephone Encounter (Signed)
  Patient is returning call to schedule televisit. Confirmed phone number [phone]

## 2024-06-01 NOTE — Progress Notes (Signed)
 Virtual Visit via Telephone Note   Because of [facility] [place] co-morbid illnesses, she is at least at moderate risk for complications without adequate follow up.  This format is felt to be most appropriate for this patient at this time.  Due to technical limitations with video connection (technology), today's appointment will be conducted as an audio only telehealth visit, and Morgan Torres verbally agreed to proceed in this manner.   All issues noted in this document were discussed and addressed.  No physical exam could be performed with this format.  Evaluation Performed:  Preoperative cardiovascular risk assessment _____________   Date:  06/01/2024   Patient ID:  Morgan Torres, DOB 06-06-1951, MRN [id number] Patient Location:  Home Provider location:   Office  Primary Care Provider:  Leigh Lung, MD Primary Cardiologist:  Vinie JAYSON Maxcy, MD  Chief Complaint / Patient Profile   73 y.o. y/o female with a h/o NICM, HTN, NSTEMI with moderate nonobstructive CAD on cath in 2014, symptoms thought to be secondary to Takotsubo CM, tobacco abuse, hypothyroidism, CKD Stage IIIa, obesity, COPD, hyponatremia who is pending cystoscopy with transurethral resection of bladder tumor with Dr. Carolynn on 06/03/24 and presents today for telephonic preoperative cardiovascular risk assessment.  History of Present Illness    Morgan Torres is a 73 y.o. female who presents via audio/video conferencing for a telehealth visit today.  Pt was last seen in cardiology clinic on 12/24/23 by Morgan Beauvais, NP.  At that time Morgan Torres was doing well.  The patient is now pending procedure as outlined above. Since her last visit, she denies chest pain, shortness of breath, lower extremity edema, fatigue, palpitations, melena, hematuria, presyncope, syncope, orthopnea, and PND. She is somewhat limited by recent chemotherapy and is currently undergoing OT and PT. She was able to go out by herself today and walk from  parking lot into [place] and then into [place] to get groceries. She is able to complete > 4 METS Activity without concerning cardiac symptoms.    Past Medical History    Past Medical History:  Diagnosis Date   Anemia    Anxiety    Arthritis    hip, lumbar spine    Asthma    seasonal allergies    Bronchitis    Hx: of   Chronic kidney disease    COPD (chronic obstructive pulmonary disease) (HCC)    Coronary artery disease    Depression    Family history of adverse reaction to anesthesia    Sister hard to wake up   Fibromyalgia    Hypertension    Hypothyroidism    Lumbar herniated disc    Metastatic urothelial carcinoma (HCC)    Myocardial infarction (HCC) 06/25/2012   followed by Dr. Maxcy, treated medically, no stents   Peripheral arterial disease    of right foot   Pneumonia    Hx: of yrs ago   Sciatica    Sleep apnea    Small bowel obstruction (HCC)    several yrs ago   Past Surgical History:  Procedure Laterality Date   ABDOMINAL SURGERY     resection of small intestine   ANTERIOR CRUCIATE LIGAMENT REPAIR Bilateral    BACK SURGERY     fusion of lumbar   CARDIAC CATHETERIZATION  06/25/2012   COLON SURGERY     resection for bowel - for obstruction  6 to 7 yrs ago per pt on 01-05-2022   COLONOSCOPY  Hx; of   DILATION AND CURETTAGE OF UTERUS     EYE SURGERY Bilateral    cataracts removed   HERNIA REPAIR  02/23/1969   inguinal hernia    IR IMAGING GUIDED PORT INSERTION  06/07/2022   LEFT HEART CATHETERIZATION WITH CORONARY ANGIOGRAM N/A 03/30/2013   Procedure: LEFT HEART CATHETERIZATION WITH CORONARY ANGIOGRAM;  Surgeon: Alm LELON Clay, MD;  Location: [facility] CATH LAB;  Service: Cardiovascular;  Laterality: N/A;   left thumb surgery     yrs ago   ROBOT ASSITED LAPAROSCOPIC NEPHROURETERECTOMY Left 03/09/2021   Procedure: XI ROBOT ASSITED LAPAROSCOPIC NEPHROURETERECTOMY/ CYSTOSCOPY WITH LEFT URETEROSCOPY WITH TRANSURETHRAL RESECTION OF URETERAL ORIFICE;   Surgeon: Devere Lonni Righter, MD;  Location: [facility];  Service: Urology;  Laterality: Left;   SALIVARY GLAND SURGERY Left    approached from inside mouth & side of neck 1970-1980   TONSILLECTOMY     as child   TOTAL HIP ARTHROPLASTY Left 05/28/2014   Procedure: LEFT TOTAL HIP ARTHROPLASTY;  Surgeon: LELON JONETTA Shari Mickey., MD;  Location: [facility];  Service: Orthopedics;  Laterality: Left;   TRANSURETHRAL RESECTION OF BLADDER TUMOR N/A 08/23/2021   Procedure: TRANSURETHRAL RESECTION OF BLADDER TUMOR (TURBT) WITH CYSTOSCOPY/ POSTOPERATIVE INSTILLATION OF GEMCITABINE /retrograde pyelogram and stent placement (right);  Surgeon: Devere Lonni Righter, MD;  Location: [facility];  Service: Urology;  Laterality: N/A;   TRANSURETHRAL RESECTION OF BLADDER TUMOR N/A 01/10/2022   Procedure: TRANSURETHRAL RESECTION OF BLADDER TUMOR (TURBT) WITH CYSTOSCOPY / GEMCITABINE  INSTILLATION post-operatively;  Surgeon: Devere Lonni Righter, MD;  Location: [facility];  Service: Urology;  Laterality: N/A;  ONLY NEEDS 30 MIN   TRANSURETHRAL RESECTION OF BLADDER TUMOR N/A 12/25/2023   Procedure: TURBT (TRANSURETHRAL RESECTION OF BLADDER TUMOR);  Surgeon: Devere Lonni Righter, MD;  Location: [facility];  Service: Urology;  Laterality: N/A;  CYSTOSCOPY WITH TURBT (TRANSURETHRAL RESECTION OF BLADDER TUMOR)    Allergies  Allergies  Allergen Reactions   Diclofenac Anaphylaxis   Padcev  [Enfortumab Vedotin ] Other (See Comments)    [name]   Ciprofloxacin Rash   Codeine Itching   Hydrocodone Itching and Other (See Comments)    Skin broke out while taking Norco 7.5/325    Home Medications    Prior to Admission medications   Medication Sig Start Date End Date Taking? Authorizing Provider  acetaminophen  (TYLENOL ) 500 MG tablet Take 1 tablet (500 mg total) by mouth every 6 (six) hours as needed for mild pain. 10/14/22   Rising, Asberry, PA-C  albuterol  (PROVENTIL   HFA;VENTOLIN  HFA) 108 (90 Base) MCG/ACT inhaler Inhale 2 puffs into the lungs every 6 (six) hours as needed for wheezing or shortness of breath. 05/10/16   Pearl Jenkins Lesches, NP  aspirin  EC 81 MG tablet Take 81 mg by mouth daily. Swallow whole.    [provider]  cilostazol  (PLETAL ) 50 MG tablet Take 50 mg by mouth 2 (two) times daily. Patient not taking: Reported on 06/01/2024    [provider]  cyclobenzaprine  (FLEXERIL ) 10 MG tablet Take 1 tablet (10 mg total) by mouth 2 (two) times daily as needed for muscle spasms. 12/22/23   Mannie Fairy DASEN, DO  EPINEPHrine  (EPIPEN  2-PAK) 0.3 mg/0.3 mL DEVI Inject 0.3 mLs (0.3 mg total) into the muscle once as needed (for severe allergic reaction). CAll 911 immediately if you have to use this medicine Patient taking differently: Inject 0.3 mg into the muscle once as needed (for a severe allergic reaction- CALL 9-1-1 IMMEDIATELY IF USED).  11/26/12   Piepenbrink, Delon, PA-C  guaiFENesin  (MUCINEX ) 600 MG 12 hr tablet Take 2 tablets (1,200 mg total) by mouth 2 (two) times daily. Patient taking differently: Take 1,200 mg by mouth 2 (two) times daily as needed for to loosen phlegm. 07/20/23   Regalado, Belkys A, MD  ipratropium (ATROVENT ) 0.06 % nasal spray Place 2 sprays into the nose daily as needed for rhinitis. 09/24/23   [provider]  levothyroxine  (SYNTHROID ) 112 MCG tablet Take 1 tablet (112 mcg total) by mouth daily before breakfast. 04/01/24   Heilingoetter, Cassandra L, PA-C  loperamide  (IMODIUM ) 2 MG capsule TAKE 2 TABS BY MOUTH WITH FIRST LOOSE STOOL, THEN 1 TAB WITH EACH ADDITIONAL LOOSE STOOL AS NEEDED. DO NOT EXCEED 8 TABS IN A 24-HOUR PERIOD 03/01/24   Sherrod Sherrod, MD  LORazepam  (ATIVAN ) 0.5 MG tablet Take 0.5 mg by mouth daily as needed for anxiety. 07/12/23   [provider]  magnesium  oxide (MAG-OX) 400 (240 Mg) MG tablet TAKE 1 TABLET BY MOUTH EVERY DAY 05/23/24   Heilingoetter, Cassandra L, PA-C   methocarbamol  (ROBAXIN ) 500 MG tablet Take 1 tablet (500 mg total) by mouth 2 (two) times daily. 12/01/23   Dasie Faden, MD  MILK OF MAGNESIA 400 MG/5ML suspension Take 15-30 mLs by mouth daily as needed for mild constipation.    [provider]  montelukast  (SINGULAIR ) 10 MG tablet Take 10 mg by mouth daily.    [provider]  nicotine  (NICODERM CQ  - DOSED IN MG/24 HOURS) 21 mg/24hr patch Place 21 mg onto the skin daily.    [provider]  nicotine  polacrilex (NICORETTE ) 4 MG gum Take 4 mg by mouth as needed for smoking cessation.    [provider]  nitroGLYCERIN  (NITROSTAT ) 0.4 MG SL tablet Place 1 tablet (0.4 mg total) under the tongue every 5 (five) minutes x 3 doses as needed for chest pain. 09/25/18   Hilty, Vinie BROCKS, MD  phenazopyridine  (PYRIDIUM ) 200 MG tablet Take 1 tablet (200 mg total) by mouth 3 (three) times daily as needed (for pain with urination). 12/25/23 12/24/24  Devere Lonni Righter, MD  prochlorperazine  (COMPAZINE ) 10 MG tablet Take 1 tablet (10 mg total) by mouth every 6 (six) hours as needed. Patient taking differently: Take 10 mg by mouth every 6 (six) hours as needed for nausea or vomiting. 11/06/23   Heilingoetter, Cassandra L, PA-C  temazepam  (RESTORIL ) 15 MG capsule Take 1 capsule (15 mg total) by mouth at bedtime as needed for sleep. 02/06/23   Heilingoetter, Cassandra L, PA-C  traMADol  (ULTRAM ) 50 MG tablet Take 50 mg by mouth every 6 (six) hours as needed (for pain). Patient not taking: Reported on 06/01/2024    [provider]    Physical Exam    Vital Signs:  Morgan Torres does not have vital signs available for review today.  Given telephonic nature of communication, physical exam is limited. AAOx3. NAD. Normal affect.  Speech and respirations are unlabored.  Accessory Clinical Findings    None  Assessment & Plan    1.  Preoperative Cardiovascular Risk Assessment: According to the Revised Cardiac Risk Index  (RCRI), her Perioperative Risk of Major Cardiac Event is (%): 6.6. Her Functional Capacity in METs is: 5.07 according to the Duke Activity Status Index (DASI). The patient is doing well from a cardiac perspective. Therefore, based on ACC/AHA guidelines, the patient would be at acceptable risk for the planned procedure without further cardiovascular testing.   The patient was  advised that if she develops new symptoms prior to surgery to contact our office to arrange for a follow-up visit, and she verbalized understanding.  Per office protocol, she may hold aspirin  for 5-7 days prior to procedure and should resume as soon as hemodynamically stable postoperatively.   A copy of this note will be routed to requesting surgeon.  Time:   Today, I have spent 10 minutes with the patient with telehealth technology discussing medical history, symptoms, and management plan.     Morgan EMERSON Bane, NP-C  06/01/2024, 3:43 PM [address], [address] [place], [place] [address] Office [phone] Fax [phone]

## 2024-06-01 NOTE — Telephone Encounter (Signed)
 Pt has been added on today ok per [name]. NP preop APP today. Med rec and consent are done.

## 2024-06-01 NOTE — Telephone Encounter (Addendum)
 Left detailed message for the surgery scheduler Nena Sanders we had tried x 3 to reach pt last week. Pt called back today 06/01/24 for procedure 06/03/24 and med hold x 5 days ASA.     I sent to preop APP to see if she wants to add her on today. However, we will still need to know if pt has begun to hold ASA or not.

## 2024-06-01 NOTE — Telephone Encounter (Signed)
 Pt called back today to schedule tele preop appt. See previous notes our office attempted x 3 to reach the pt. Her procedure is 06/03/24 with med hold x 5 days and today's date is 06/01/24. Will follow up with Dr. Carolynn and our preop APP as the pt will need to be added on.

## 2024-06-02 ENCOUNTER — Inpatient Hospital Stay

## 2024-06-02 ENCOUNTER — Inpatient Hospital Stay: Admitting: Physician Assistant

## 2024-06-02 ENCOUNTER — Encounter (HOSPITAL_COMMUNITY): Payer: Self-pay

## 2024-06-02 NOTE — Anesthesia Preprocedure Evaluation (Signed)
 Anesthesia Evaluation    Airway        Dental   Pulmonary Current Smoker          Cardiovascular hypertension,      Neuro/Psych    GI/Hepatic   Endo/Other    Renal/GU      Musculoskeletal   Abdominal   Peds  Hematology   Anesthesia Other Findings   Reproductive/Obstetrics                              Anesthesia Physical Anesthesia Plan  ASA:   Anesthesia Plan:    Post-op Pain Management:    Induction:   PONV Risk Score and Plan:   Airway Management Planned:   Additional Equipment:   Intra-op Plan:   Post-operative Plan:   Informed Consent:   Plan Discussed with:   Anesthesia Plan Comments: (See PAT note from 12/8 )         Anesthesia Quick Evaluation

## 2024-06-02 NOTE — Progress Notes (Signed)
 Case: [id number] Date/Time: 06/03/24 1045   Procedure: TURBT (TRANSURETHRAL RESECTION OF BLADDER TUMOR) - CYSTOSCOPY WITH TURBT (TRANSURETHRAL RESECTION OF BLADDER TUMOR)   Anesthesia type: General   Diagnosis: Malignant neoplasm of trigone of urinary bladder (HCC) [C67.0]   Pre-op diagnosis: BLADDER CANCER   [facility]   Surgeons: Devere Lonni Righter, MD       DISCUSSION: Morgan Torres is a 73 yo female with PMH of current smoking, HTN, hx of NSTEMI (2014), non obstructive CAD (by cath in 2014), PVD, OSA (no CPAP use), asthma/COPD, anemia, CKD3, fibromyalgia, anxiety, depression, metastatic urothelial carcinoma.  Patient is followed at [facility] for metastatic urothelial cancer. Last seen on 05/20/24. On chemo but has progressive disease on scans. She has had side effects/reactions to chemo including development of SJS when given Padcev  and peripheral neuropathy.  Patient follows with Cardiology for hx of NSTEMI in 2014. Cath done 03/30/13 revealed angiographically only mild to moderate CAD, with no obvious Culprit Lesion for NSTEMI. She also had bnormal LV Gram findings with WMA suggestive of Takotsubo cardiomyopathy. CAD has been treated medically. Last seen in clinic by NP Cleaver on 12/24/23 for pre op clearance prior to TURBT in July 2025. She was cleared without additional testing. She had cardiac clearance tele visit on 06/02/24 and was cleared again:  Preoperative Cardiovascular Risk Assessment: According to the Revised Cardiac Risk Index (RCRI), her Perioperative Risk of Major Cardiac Event is (%): 6.6. Her Functional Capacity in METs is: 5.07 according to the Duke Activity Status Index (DASI). The patient is doing well from a cardiac perspective. Therefore, based on ACC/AHA guidelines, the patient would be at acceptable risk for the planned procedure without further cardiovascular testing.    VS: BP 126/64   Pulse 86   Temp 37.1 C (Oral)   Ht 5' 8  (1.727 m)   Wt 75.3 kg   SpO2 100%   BMI 25.24 kg/m   PROVIDERS: Leigh Lung, MD   LABS: Labs reviewed: Acceptable for surgery. (all labs ordered are listed, but only abnormal results are displayed)  Labs Reviewed - No data to display   CT C/A/P 04/16/24:  IMPRESSION: 1. Interval enlargement of bulky mediastinal lymph nodes, as well as newly enlarged right-sided lower cervical lymph nodes, consistent with worsened nodal metastatic disease. 2. Multiple bilateral pulmonary nodules are again seen. Mixed response to treatment with new nodules, enlarged nodules, and diminished nodules. 3. Evaluation of the bladder very limited by decompression, lack of intravenous contrast, and dense metallic streak artifact from left hip arthroplasty. No obvious abnormality. 4. Left nephrectomy. 5. Emphysema. 6. Coronary artery disease.   Aortic Atherosclerosis (ICD10-I70.0) and Emphysema (ICD10-J43.9).  EKG 12/24/23:  Sinus tachycardia, rate 103   Echo 02/02/2021:  IMPRESSIONS    1. Left ventricular ejection fraction, by estimation, is 60 to 65%. The left ventricle has normal function. The left ventricle has no regional wall motion abnormalities. There is mild left ventricular hypertrophy. Left ventricular diastolic parameters were normal. The average left ventricular global longitudinal strain is -18.4 %. The global longitudinal strain is normal.  2. Right ventricular systolic function is normal. The right ventricular size is normal.  3. The mitral valve is abnormal. Trivial mitral valve regurgitation. No evidence of mitral stenosis.  4. The aortic valve is tricuspid. Aortic valve regurgitation is not visualized. Mild to moderate aortic valve sclerosis/calcification is present, without any evidence of aortic stenosis.  5. The inferior vena cava is normal in size with greater  than 50% respiratory variability, suggesting right atrial pressure of 3 mmHg.  Stress test  01/31/2021:  The left ventricular ejection fraction is hyperdynamic (>65%). Nuclear stress EF: 68%. There was no ST segment deviation noted during stress. The study is normal. This is a low risk study.   Normal stress nuclear study with no ischemia or infarction.  Gated ejection fraction 68% with normal wall motion. Past Medical History:  Diagnosis Date   Anemia    Anxiety    Arthritis    hip, lumbar spine    Asthma    seasonal allergies    Bronchitis    Hx: of   Chronic kidney disease    COPD (chronic obstructive pulmonary disease) (HCC)    Coronary artery disease    Depression    Family history of adverse reaction to anesthesia    Sister hard to wake up   Fibromyalgia    Hypertension    Hypothyroidism    Lumbar herniated disc    Metastatic urothelial carcinoma (HCC)    Myocardial infarction (HCC) 06/25/2012   followed by Dr. Mona, treated medically, no stents   Peripheral arterial disease    of right foot   Pneumonia    Hx: of yrs ago   Sciatica    Sleep apnea    Small bowel obstruction (HCC)    several yrs ago    Past Surgical History:  Procedure Laterality Date   ABDOMINAL SURGERY     resection of small intestine   ANTERIOR CRUCIATE LIGAMENT REPAIR Bilateral    BACK SURGERY     fusion of lumbar   CARDIAC CATHETERIZATION  06/25/2012   COLON SURGERY     resection for bowel - for obstruction  6 to 7 yrs ago per pt on 01-05-2022   COLONOSCOPY     Hx; of   DILATION AND CURETTAGE OF UTERUS     EYE SURGERY Bilateral    cataracts removed   HERNIA REPAIR  02/23/1969   inguinal hernia    IR IMAGING GUIDED PORT INSERTION  06/07/2022   LEFT HEART CATHETERIZATION WITH CORONARY ANGIOGRAM N/A 03/30/2013   Procedure: LEFT HEART CATHETERIZATION WITH CORONARY ANGIOGRAM;  Surgeon: Alm LELON Clay, MD;  Location[facility] CATH LAB;  Service: Cardiovascular;  Laterality: N/A;   left thumb surgery     yrs ago   ROBOT ASSITED LAPAROSCOPIC NEPHROURETERECTOMY Left 03/09/2021    Procedure: XI ROBOT ASSITED LAPAROSCOPIC NEPHROURETERECTOMY/ CYSTOSCOPY WITH LEFT URETEROSCOPY WITH TRANSURETHRAL RESECTION OF URETERAL ORIFICE;  Surgeon: Devere Lonni Righter, MD;  Location: [facility];  Service: Urology;  Laterality: Left;   SALIVARY GLAND SURGERY Left    approached from inside mouth & side of neck 1970-1980   TONSILLECTOMY     as child   TOTAL HIP ARTHROPLASTY Left 05/28/2014   Procedure: LEFT TOTAL HIP ARTHROPLASTY;  Surgeon: LELON JONETTA Shari Mickey., MD;  Location: [facility];  Service: Orthopedics;  Laterality: Left;   TRANSURETHRAL RESECTION OF BLADDER TUMOR N/A 08/23/2021   Procedure: TRANSURETHRAL RESECTION OF BLADDER TUMOR (TURBT) WITH CYSTOSCOPY/ POSTOPERATIVE INSTILLATION OF GEMCITABINE /retrograde pyelogram and stent placement (right);  Surgeon: Devere Lonni Righter, MD;  Location: [facility];  Service: Urology;  Laterality: N/A;   TRANSURETHRAL RESECTION OF BLADDER TUMOR N/A 01/10/2022   Procedure: TRANSURETHRAL RESECTION OF BLADDER TUMOR (TURBT) WITH CYSTOSCOPY / GEMCITABINE  INSTILLATION post-operatively;  Surgeon: Devere Lonni Righter, MD;  Location: [facility];  Service: Urology;  Laterality: N/A;  ONLY NEEDS 30 MIN   TRANSURETHRAL RESECTION OF  BLADDER TUMOR N/A 12/25/2023   Procedure: TURBT (TRANSURETHRAL RESECTION OF BLADDER TUMOR);  Surgeon: Devere Lonni Righter, MD;  Location: [facility];  Service: Urology;  Laterality: N/A;  CYSTOSCOPY WITH TURBT (TRANSURETHRAL RESECTION OF BLADDER TUMOR)    MEDICATIONS:  acetaminophen  (TYLENOL ) 500 MG tablet   albuterol  (PROVENTIL  HFA;VENTOLIN  HFA) 108 (90 Base) MCG/ACT inhaler   aspirin  EC 81 MG tablet   cyclobenzaprine  (FLEXERIL ) 10 MG tablet   EPINEPHrine  (EPIPEN  2-PAK) 0.3 mg/0.3 mL DEVI   guaiFENesin  (MUCINEX ) 600 MG 12 hr tablet   ipratropium (ATROVENT ) 0.06 % nasal spray   levothyroxine  (SYNTHROID ) 112 MCG tablet   loperamide  (IMODIUM ) 2 MG capsule   LORazepam  (ATIVAN ) 0.5 MG tablet    magnesium  oxide (MAG-OX) 400 (240 Mg) MG tablet   methocarbamol  (ROBAXIN ) 500 MG tablet   MILK OF MAGNESIA 400 MG/5ML suspension   montelukast  (SINGULAIR ) 10 MG tablet   nicotine  (NICODERM CQ  - DOSED IN MG/24 HOURS) 21 mg/24hr patch   nicotine  polacrilex (NICORETTE ) 4 MG gum   nitroGLYCERIN  (NITROSTAT ) 0.4 MG SL tablet   phenazopyridine  (PYRIDIUM ) 200 MG tablet   prochlorperazine  (COMPAZINE ) 10 MG tablet   temazepam  (RESTORIL ) 15 MG capsule   traMADol  (ULTRAM ) 50 MG tablet   No current facility-administered medications for this encounter.   Burnard CHRISTELLA Odis DEVONNA [facility] Short Stay/Anesthesiology [facility] Phone [phone] 06/02/2024 9:23 AM

## 2024-06-03 ENCOUNTER — Encounter (HOSPITAL_COMMUNITY): Payer: Self-pay | Admitting: Medical

## 2024-06-03 ENCOUNTER — Ambulatory Visit (HOSPITAL_COMMUNITY)
Admission: RE | Admit: 2024-06-03 | Discharge: 2024-06-03 | Disposition: A | Discharge status: Home / Self Care | Attending: Urology | Admitting: Urology

## 2024-06-03 ENCOUNTER — Other Ambulatory Visit: Payer: Self-pay

## 2024-06-03 ENCOUNTER — Encounter (HOSPITAL_COMMUNITY): Payer: Self-pay | Admitting: Urology

## 2024-06-03 ENCOUNTER — Encounter: Admission: RE | Disposition: A | Payer: Self-pay | Attending: Urology

## 2024-06-03 ENCOUNTER — Ambulatory Visit (HOSPITAL_COMMUNITY): Admitting: Anesthesiology

## 2024-06-03 DIAGNOSIS — I10 Essential (primary) hypertension: Secondary | ICD-10-CM

## 2024-06-03 DIAGNOSIS — I2510 Atherosclerotic heart disease of native coronary artery without angina pectoris: Secondary | ICD-10-CM | POA: Diagnosis not present

## 2024-06-03 DIAGNOSIS — F17210 Nicotine dependence, cigarettes, uncomplicated: Secondary | ICD-10-CM | POA: Diagnosis not present

## 2024-06-03 DIAGNOSIS — D494 Neoplasm of unspecified behavior of bladder: Secondary | ICD-10-CM | POA: Diagnosis not present

## 2024-06-03 HISTORY — PX: TRANSURETHRAL RESECTION OF BLADDER TUMOR: SHX2575

## 2024-06-03 SURGERY — TURBT (TRANSURETHRAL RESECTION OF BLADDER TUMOR)
Anesthesia: General

## 2024-06-03 MED ORDER — CEFAZOLIN SODIUM-DEXTROSE 2-4 GM/100ML-% IV SOLN
2.0000 g | INTRAVENOUS | Status: AC
  Administered 2024-06-03: 2 g via INTRAVENOUS
  Filled 2024-06-03: qty 100

## 2024-06-03 MED ORDER — SODIUM CHLORIDE 0.9 % IR SOLN
Status: DC | PRN
  Administered 2024-06-03: 6000 mL via INTRAVESICAL

## 2024-06-03 MED ORDER — FENTANYL CITRATE (PF) 100 MCG/2ML IJ SOLN
INTRAMUSCULAR | Status: DC | PRN
  Administered 2024-06-03: 50 ug via INTRAVENOUS

## 2024-06-03 MED ORDER — CHLORHEXIDINE GLUCONATE 0.12 % MT SOLN
15.0000 mL | Freq: Once | OROMUCOSAL | Status: AC
  Administered 2024-06-03: 15 mL via OROMUCOSAL

## 2024-06-03 MED ORDER — PHENYLEPHRINE 80 MCG/ML (10ML) SYRINGE FOR IV PUSH (FOR BLOOD PRESSURE SUPPORT)
PREFILLED_SYRINGE | INTRAVENOUS | Status: DC | PRN
  Administered 2024-06-03: 160 ug via INTRAVENOUS
  Administered 2024-06-03: 80 ug via INTRAVENOUS
  Administered 2024-06-03: 160 ug via INTRAVENOUS

## 2024-06-03 MED ORDER — AMISULPRIDE (ANTIEMETIC) 5 MG/2ML IV SOLN: 10.0000 mg | Freq: Once | INTRAVENOUS | Status: DC | PRN

## 2024-06-03 MED ORDER — PROPOFOL 10 MG/ML IV BOLUS
INTRAVENOUS | Status: AC
  Filled 2024-06-03: qty 20

## 2024-06-03 MED ORDER — FENTANYL CITRATE (PF) 50 MCG/ML IJ SOSY: 25.0000 ug | PREFILLED_SYRINGE | INTRAMUSCULAR | Status: DC | PRN

## 2024-06-03 MED ORDER — ROCURONIUM BROMIDE 100 MG/10ML IV SOLN
INTRAVENOUS | Status: DC | PRN
  Administered 2024-06-03: 40 mg via INTRAVENOUS

## 2024-06-03 MED ORDER — LIDOCAINE HCL (CARDIAC) PF 100 MG/5ML IV SOSY
PREFILLED_SYRINGE | INTRAVENOUS | Status: DC | PRN
  Administered 2024-06-03: 60 mg via INTRAVENOUS

## 2024-06-03 MED ORDER — ONDANSETRON HCL 4 MG/2ML IJ SOLN
INTRAMUSCULAR | Status: DC | PRN
  Administered 2024-06-03: 4 mg via INTRAVENOUS

## 2024-06-03 MED ORDER — LIDOCAINE HCL (PF) 2 % IJ SOLN
INTRAMUSCULAR | Status: AC
  Filled 2024-06-03: qty 5

## 2024-06-03 MED ORDER — OXYBUTYNIN CHLORIDE 5 MG PO TABS: 5.0000 mg | ORAL_TABLET | Freq: Three times a day (TID) | ORAL | 1 refills | Status: AC | PRN

## 2024-06-03 MED ORDER — DEXAMETHASONE SOD PHOSPHATE PF 10 MG/ML IJ SOLN
INTRAMUSCULAR | Status: DC | PRN
  Administered 2024-06-03: 5 mg via INTRAVENOUS

## 2024-06-03 MED ORDER — PROPOFOL 10 MG/ML IV BOLUS
INTRAVENOUS | Status: DC | PRN
  Administered 2024-06-03: 130 mg via INTRAVENOUS

## 2024-06-03 MED ORDER — LACTATED RINGERS IV SOLN: INTRAVENOUS | Status: DC

## 2024-06-03 MED ORDER — SUGAMMADEX SODIUM 200 MG/2ML IV SOLN
INTRAVENOUS | Status: DC | PRN
  Administered 2024-06-03: 200 mg via INTRAVENOUS

## 2024-06-03 MED ORDER — ONDANSETRON HCL 4 MG/2ML IJ SOLN
INTRAMUSCULAR | Status: AC
  Filled 2024-06-03: qty 2

## 2024-06-03 MED ORDER — ORAL CARE MOUTH RINSE: 15.0000 mL | Freq: Once | OROMUCOSAL | Status: AC

## 2024-06-03 MED ORDER — OXYCODONE HCL 5 MG PO TABS: 5.0000 mg | ORAL_TABLET | Freq: Once | ORAL | Status: DC | PRN

## 2024-06-03 MED ORDER — ACETAMINOPHEN 500 MG PO TABS
1000.0000 mg | ORAL_TABLET | Freq: Once | ORAL | Status: AC
  Administered 2024-06-03: 1000 mg via ORAL
  Filled 2024-06-03: qty 2

## 2024-06-03 MED ORDER — FENTANYL CITRATE (PF) 100 MCG/2ML IJ SOLN
INTRAMUSCULAR | Status: AC
  Filled 2024-06-03: qty 2

## 2024-06-03 MED ORDER — OXYCODONE HCL 5 MG/5ML PO SOLN: 5.0000 mg | Freq: Once | ORAL | Status: DC | PRN

## 2024-06-03 SURGICAL SUPPLY — 13 items
BAG URINE DRAIN 2000ML AR STRL (UROLOGICAL SUPPLIES) IMPLANT
BAG URO CATCHER STRL LF (MISCELLANEOUS) ×1 IMPLANT
DRAPE FOOT SWITCH (DRAPES) ×1 IMPLANT
ELECT REM PT RETURN 15FT ADLT (MISCELLANEOUS) ×1 IMPLANT
GLOVE SURG LX STRL 8.0 MICRO (GLOVE) ×1 IMPLANT
GOWN STRL SURGICAL XL XLNG (GOWN DISPOSABLE) ×1 IMPLANT
KIT TURNOVER KIT A (KITS) ×1 IMPLANT
LOOP CUT BIPOLAR 24F LRG (ELECTROSURGICAL) IMPLANT
MANIFOLD NEPTUNE II (INSTRUMENTS) ×1 IMPLANT
PACK CYSTO (CUSTOM PROCEDURE TRAY) ×1 IMPLANT
SYRINGE TOOMEY IRRIG 70ML (MISCELLANEOUS) IMPLANT
TUBING CONNECTING 10 (TUBING) ×1 IMPLANT
TUBING UROLOGY SET (TUBING) ×1 IMPLANT

## 2024-06-03 NOTE — Op Note (Signed)
 Operative Note  Preoperative diagnosis:  1.  Two 5 mm papillary bladder tumors involving the anterior bladder wall/bladder dome 2.  History of stage IV UCC  Postoperative diagnosis: 1.  Two 5 mm papillary bladder tumors involving the anterior bladder wall/bladder dome 2.  History of stage IV UCC  Procedure(s): 1.  Cystoscopy with TURBT (small)  Surgeon: Lonni Han, MD  Assistants:  None  Anesthesia:  General  Complications:  None  EBL: Less than 5 mL  Specimens: 1.  Two 5 mm papillary bladder tumors  Drains/Catheters: 1.  None  Intraoperative findings:   Two 5 mm papillary bladder tumors involving the anterior bladder wall/bladder dome  Indication:  Morgan Torres is a 73 y.o. female with stage IV UCC of the left renal pelvis, s/p left robotic nephro ureterectomy in 2022 along with multiple bladder tumor recurrences requiring several TURBTs.  Surveillance cystoscopy on 05/25/2024 revealed 2 new 5 mm papillary bladder tumors involving the anterior bladder wall/bladder dome.  She has been consented for the above procedures, voices understanding and wishes to proceed.  Description of procedure: After informed consent was obtained, the patient was brought to the operating room and general LMA anesthesia was administered. The patient was then placed in the dorsolithotomy position and prepped and draped in the usual sterile fashion. A timeout was performed. A 21 French rigid cystoscope was then inserted into the urethral meatus and advanced into the bladder under direct vision. A complete bladder survey revealed two 5 mm papillary bladder tumors involving the anterior bladder wall/bladder dome.  The rigid cystoscope was then exchanged for a 26 French resectoscope with a bipolar loop working element.  Both bladder tumors, which were immediately adjacent to each other, were then resected down to the detrusor musculature.  There was no evidence of bladder perforation following the  resection.  The bladder tumor specimens were then siphon out of the bladder through the sheath of the resectoscope and sent for permanent section.  The areas of resection were then extensively fulgurated until hemostasis was achieved.  She tolerated the procedure well and was transferred to the postanesthesia in stable condition.  Plan: Follow-up on 06/12/2024 to discuss pathology results.

## 2024-06-03 NOTE — H&P (Signed)
 Urology Preoperative H&P   Chief Complaint: Recurrent bladder cancer   History of Present Illness: Morgan Torres is a 73 y.o. female with stage IV UCC of the left renal pelvis, s/p left robotic nephroureterectomy on 03/09/21. She was found to have multiple papillary bladder tumors on surveillance cystoscopy in January 2023 and is status post TURBT with gemcitabine  instillation on 08/23/2021. TURBT path showed HG Ta UCC. Induction BCG completed 11/14/21. She subsequently had another bladder tumor recurrence requiring a 2nd TURBT with gemcitabine  instillation on 01/10/22, which revealed low grade UCC. She had a third bladder tumor recurrence involving the anterior bladder neck and required an additional TURBT on 12/25/23. Pathology from that specimen came back high-grade TA urothelial carcinoma evidence of invasion. She developed metastatic disease in 2023 and in the metastatic setting she has been treated with gem+carbo, avelumab , erdafitinib , EV+ pembro, pembro monotherapy, SG. She is currently followed by [facility] oncology and surveillance imaging shows evidence of thoracic disease progression. Currently considering a clinical trial with Paclitaxel and Keytruda .  Cystoscopy on 05/25/24 revealed two 5 mm papillary bladder tumors involving the anterior bladder wall/bladder dome. No other intravesical or urethral abnormalities were seen. She is here today for a TURBT.   Past Medical History:  Diagnosis Date   Anemia    Anxiety    Arthritis    hip, lumbar spine    Asthma    seasonal allergies    Bronchitis    Hx: of   Chronic kidney disease    CKD (chronic kidney disease)    COPD (chronic obstructive pulmonary disease) (HCC)    Coronary artery disease    Depression    Family history of adverse reaction to anesthesia    Sister hard to wake up   Fibromyalgia    Hypertension    Hypothyroidism    Lumbar herniated disc    Metastatic urothelial carcinoma (HCC)    Myocardial infarction (HCC) 06/25/2012    followed by Dr. Mona, treated medically, no stents   Peripheral arterial disease    of right foot   Pneumonia    Hx: of yrs ago   Sciatica    Sleep apnea    Small bowel obstruction (HCC)    several yrs ago    Past Surgical History:  Procedure Laterality Date   ABDOMINAL SURGERY     resection of small intestine   ANTERIOR CRUCIATE LIGAMENT REPAIR Bilateral    BACK SURGERY     fusion of lumbar   CARDIAC CATHETERIZATION  06/25/2012   COLON SURGERY     resection for bowel - for obstruction  6 to 7 yrs ago per pt on 01-05-2022   COLONOSCOPY     Hx; of   DILATION AND CURETTAGE OF UTERUS     EYE SURGERY Bilateral    cataracts removed   HERNIA REPAIR  02/23/1969   inguinal hernia    IR IMAGING GUIDED PORT INSERTION  06/07/2022   LEFT HEART CATHETERIZATION WITH CORONARY ANGIOGRAM N/A 03/30/2013   Procedure: LEFT HEART CATHETERIZATION WITH CORONARY ANGIOGRAM;  Surgeon: Alm LELON Clay, MD;  Location: [facility] CATH LAB;  Service: Cardiovascular;  Laterality: N/A;   left thumb surgery     yrs ago   ROBOT ASSITED LAPAROSCOPIC NEPHROURETERECTOMY Left 03/09/2021   Procedure: XI ROBOT ASSITED LAPAROSCOPIC NEPHROURETERECTOMY/ CYSTOSCOPY WITH LEFT URETEROSCOPY WITH TRANSURETHRAL RESECTION OF URETERAL ORIFICE;  Surgeon: Devere Lonni Righter, MD;  Location: [facility];  Service: Urology;  Laterality: Left;   SALIVARY GLAND SURGERY Left  approached from inside mouth & side of neck 1970-1980   TONSILLECTOMY     as child   TOTAL HIP ARTHROPLASTY Left 05/28/2014   Procedure: LEFT TOTAL HIP ARTHROPLASTY;  Surgeon: LELON JONETTA Shari Mickey., MD;  Location: [facility] OR;  Service: Orthopedics;  Laterality: Left;   TRANSURETHRAL RESECTION OF BLADDER TUMOR N/A 08/23/2021   Procedure: TRANSURETHRAL RESECTION OF BLADDER TUMOR (TURBT) WITH CYSTOSCOPY/ POSTOPERATIVE INSTILLATION OF GEMCITABINE /retrograde pyelogram and stent placement (right);  Surgeon: Devere Lonni Righter, MD;  Location: [facility];   Service: Urology;  Laterality: N/A;   TRANSURETHRAL RESECTION OF BLADDER TUMOR N/A 01/10/2022   Procedure: TRANSURETHRAL RESECTION OF BLADDER TUMOR (TURBT) WITH CYSTOSCOPY / GEMCITABINE  INSTILLATION post-operatively;  Surgeon: Devere Lonni Righter, MD;  Location: [facility];  Service: Urology;  Laterality: N/A;  ONLY NEEDS 30 MIN   TRANSURETHRAL RESECTION OF BLADDER TUMOR N/A 12/25/2023   Procedure: TURBT (TRANSURETHRAL RESECTION OF BLADDER TUMOR);  Surgeon: Devere Lonni Righter, MD;  Location: [facility];  Service: Urology;  Laterality: N/A;  CYSTOSCOPY WITH TURBT (TRANSURETHRAL RESECTION OF BLADDER TUMOR)    Allergies:  Allergies  Allergen Reactions   Diclofenac Anaphylaxis   Padcev  [Enfortumab Vedotin ] Other (See Comments)    [name]   Ciprofloxacin Rash   Codeine Itching   Hydrocodone Itching and Other (See Comments)    Skin broke out while taking Norco 7.5/325    Family History  Problem Relation Age of Onset   Hypertension Mother    Diabetes Mother    Cancer - Other Mother    Cancer Sister    Breast cancer Daughter     Social History:  reports that she has been smoking cigarettes. She has a 12.5 pack-year smoking history. She has never used smokeless tobacco. She reports that she does not currently use alcohol . She reports that she does not use drugs.  ROS: A complete review of systems was performed.  All systems are negative except for pertinent findings as noted.  Physical Exam:  Vital signs in last 24 hours:   Constitutional:  Alert and oriented, No acute distress Cardiovascular: Regular rate and rhythm, No JVD Respiratory: Normal respiratory effort, Lungs clear bilaterally GI: Abdomen is soft, nontender, nondistended, no abdominal masses GU: No CVA tenderness Lymphatic: No lymphadenopathy Neurologic: Grossly intact, no focal deficits Psychiatric: Normal mood and affect  Laboratory Data:  No results for input(s): WBC, HGB,  HCT, PLT in the last 72 hours.  No results for input(s): NA, K, CL, GLUCOSE, BUN, CALCIUM , CREATININE in the last 72 hours.  Invalid input(s): CO3   No results found for this or any previous visit (from the past 24 hours). No results found for this or any previous visit (from the past 240 hours).  Renal Function: No results for input(s): CREATININE in the last 168 hours. CrCl cannot be calculated (Patient's most recent lab result is older than the maximum 21 days allowed.).  Radiologic Imaging: No results found.  I independently reviewed the above imaging studies.  Assessment and Plan Morgan Torres is a 73 y.o. female with stage IV UCC with multiple bladder recurrences    -The risks, benefits and alternatives of cystoscopy with TURBT were discussed in detail.  She voices understanding and wishes to proceed.   Lonni Devere, MD 06/03/2024, 7:38 AM  [facility] Pager: [phone]

## 2024-06-03 NOTE — Anesthesia Procedure Notes (Signed)
 Procedure Name: Intubation Date/Time: 06/03/2024 12:01 PM  Performed by: Erick Fitz, CRNAPre-anesthesia Checklist: Patient identified, Emergency Drugs available, Suction available, Patient being monitored and Timeout performed Patient Re-evaluated:Patient Re-evaluated prior to induction Oxygen  Delivery Method: Circle system utilized Preoxygenation: Pre-oxygenation with 100% oxygen  Induction Type: IV induction Ventilation: Mask ventilation without difficulty Laryngoscope Size: Mac and 3 Grade View: Grade I Tube type: Oral Tube size: 7.0 mm Airway Equipment and Method: Stylet Placement Confirmation: ETT inserted through vocal cords under direct vision, positive ETCO2, CO2 detector and breath sounds checked- equal and bilateral Secured at: 22 cm Tube secured with: Tape (secured with 1/2 white silk tape) Dental Injury: Teeth and Oropharynx as per pre-operative assessment

## 2024-06-03 NOTE — Anesthesia Postprocedure Evaluation (Signed)
 Anesthesia Post Note  Patient: Morgan Torres  Procedure(s) Performed: TURBT (TRANSURETHRAL RESECTION OF BLADDER TUMOR)     Patient location during evaluation: PACU Anesthesia Type: General Level of consciousness: awake and alert Pain management: pain level controlled Vital Signs Assessment: post-procedure vital signs reviewed and stable Respiratory status: spontaneous breathing, nonlabored ventilation, respiratory function stable and patient connected to nasal cannula oxygen  Cardiovascular status: blood pressure returned to baseline and stable Postop Assessment: no apparent nausea or vomiting Anesthetic complications: no   There were no known notable events for this encounter.  Last Vitals:  Vitals:   06/03/24 1330 06/03/24 1353  BP: 125/60   Pulse: 63 66  Resp: 17 14  Temp:  36.4 C  SpO2: 92% 96%    Last Pain:  Vitals:   06/03/24 1353  TempSrc:   PainSc: 0-No pain                 Epifanio Lamar BRAVO

## 2024-06-03 NOTE — Transfer of Care (Signed)
 Immediate Anesthesia Transfer of Care Note  Patient: Morgan Torres  Procedure(s) Performed: TURBT (TRANSURETHRAL RESECTION OF BLADDER TUMOR)  Patient Location: PACU  Anesthesia Type:General  Level of Consciousness: awake, alert , oriented, and patient cooperative  Airway & Oxygen  Therapy: Patient Spontanous Breathing and Patient connected to face mask oxygen   Post-op Assessment: Report given to RN and Post -op Vital signs reviewed and stable  Post vital signs: Reviewed and stable  Last Vitals:  Vitals Value Taken Time  BP 105/58 06/03/24 12:39  Temp    Pulse 65 06/03/24 12:42  Resp 13 06/03/24 12:43  SpO2 96 % 06/03/24 12:42  Vitals shown include unfiled device data.  Last Pain:  Vitals:   06/03/24 0830  TempSrc: Oral         Complications: There were no known notable events for this encounter.

## 2024-06-04 ENCOUNTER — Encounter (HOSPITAL_COMMUNITY): Payer: Self-pay | Admitting: Urology

## 2024-06-04 LAB — SURGICAL PATHOLOGY

## 2024-06-23 ENCOUNTER — Inpatient Hospital Stay

## 2024-06-23 ENCOUNTER — Inpatient Hospital Stay: Admitting: Internal Medicine

## 2024-06-27 ENCOUNTER — Other Ambulatory Visit: Payer: Self-pay | Admitting: Physician Assistant

## 2024-06-27 DIAGNOSIS — E039 Hypothyroidism, unspecified: Secondary | ICD-10-CM

## 2024-06-30 NOTE — Progress Notes (Signed)
 [facility] CLINICAL SOCIAL WORK NOTE  PATIENT INFORMATION:  Morgan Torres female 1951/01/23 [id number]   REFERRAL SOURCE: Ongoing Care   REASON FOR REFERRAL:  Barriers to Care / SDOH Concerns, Care Coordination  IDENTIFYING STATEMENT: Morgan Torres is a 74 y.o. female followed at [facility] for treatment of metastatic urothelial cancer.  Patient lives in [place] (50 miles from [place]) and is insured with [facility] Medicare Advantage.   With patient's permission, CSW submitted application to [employer] for assistance with [employer].  Specifically, request was made for $250 toward the cost of her $558.09 bill.  A determination is currently pending.  Rollene Kingdom, MSW, LCSW Oncology Clinical Social Worker [phone]
